# Patient Record
Sex: Female | Born: 1935 | Race: White | Hispanic: No | Smoking: Never smoker
Health system: Southern US, Community
[De-identification: ages and names within clinical notes are randomized; demographics above are authoritative.]

## PROBLEM LIST (undated history)

## (undated) DIAGNOSIS — T8859XA Other complications of anesthesia, initial encounter: Secondary | ICD-10-CM

## (undated) DIAGNOSIS — I2510 Atherosclerotic heart disease of native coronary artery without angina pectoris: Secondary | ICD-10-CM

## (undated) DIAGNOSIS — K219 Gastro-esophageal reflux disease without esophagitis: Secondary | ICD-10-CM

## (undated) DIAGNOSIS — T4145XA Adverse effect of unspecified anesthetic, initial encounter: Secondary | ICD-10-CM

## (undated) DIAGNOSIS — D472 Monoclonal gammopathy: Secondary | ICD-10-CM

## (undated) DIAGNOSIS — R29898 Other symptoms and signs involving the musculoskeletal system: Secondary | ICD-10-CM

## (undated) DIAGNOSIS — G8929 Other chronic pain: Secondary | ICD-10-CM

## (undated) DIAGNOSIS — K635 Polyp of colon: Secondary | ICD-10-CM

## (undated) DIAGNOSIS — F32A Depression, unspecified: Secondary | ICD-10-CM

## (undated) DIAGNOSIS — M1990 Unspecified osteoarthritis, unspecified site: Secondary | ICD-10-CM

## (undated) DIAGNOSIS — E039 Hypothyroidism, unspecified: Secondary | ICD-10-CM

## (undated) DIAGNOSIS — G2581 Restless legs syndrome: Secondary | ICD-10-CM

## (undated) DIAGNOSIS — J189 Pneumonia, unspecified organism: Secondary | ICD-10-CM

## (undated) DIAGNOSIS — N951 Menopausal and female climacteric states: Secondary | ICD-10-CM

## (undated) DIAGNOSIS — IMO0002 Reserved for concepts with insufficient information to code with codable children: Secondary | ICD-10-CM

## (undated) DIAGNOSIS — E785 Hyperlipidemia, unspecified: Secondary | ICD-10-CM

## (undated) DIAGNOSIS — F419 Anxiety disorder, unspecified: Secondary | ICD-10-CM

## (undated) DIAGNOSIS — I10 Essential (primary) hypertension: Secondary | ICD-10-CM

## (undated) DIAGNOSIS — N3281 Overactive bladder: Secondary | ICD-10-CM

## (undated) DIAGNOSIS — I8390 Asymptomatic varicose veins of unspecified lower extremity: Secondary | ICD-10-CM

## (undated) DIAGNOSIS — G629 Polyneuropathy, unspecified: Secondary | ICD-10-CM

## (undated) DIAGNOSIS — R519 Headache, unspecified: Secondary | ICD-10-CM

## (undated) DIAGNOSIS — Z8719 Personal history of other diseases of the digestive system: Secondary | ICD-10-CM

## (undated) HISTORY — DX: Reserved for concepts with insufficient information to code with codable children: IMO0002

## (undated) HISTORY — PX: REPLACEMENT TOTAL KNEE: SUR1224

## (undated) HISTORY — PX: OTHER SURGICAL HISTORY: SHX169

## (undated) HISTORY — DX: Monoclonal gammopathy: D47.2

## (undated) HISTORY — DX: Polyneuropathy, unspecified: G62.9

## (undated) HISTORY — DX: Asymptomatic varicose veins of unspecified lower extremity: I83.90

## (undated) HISTORY — PX: EYE SURGERY: SHX253

## (undated) HISTORY — DX: Headache, unspecified: R51.9

## (undated) HISTORY — DX: Polyp of colon: K63.5

## (undated) HISTORY — DX: Hyperlipidemia, unspecified: E78.5

## (undated) HISTORY — DX: Anxiety disorder, unspecified: F41.9

## (undated) HISTORY — PX: COLONOSCOPY: SHX174

## (undated) HISTORY — PX: KYPHOPLASTY: SHX5884

## (undated) HISTORY — PX: OOPHORECTOMY: SHX86

## (undated) HISTORY — DX: Overactive bladder: N32.81

## (undated) HISTORY — DX: Menopausal and female climacteric states: N95.1

## (undated) HISTORY — DX: Other chronic pain: G89.29

---

## 1980-05-30 HISTORY — PX: CARPAL TUNNEL RELEASE: SHX101

## 1990-05-30 HISTORY — PX: CHOLECYSTECTOMY: SHX55

## 1990-05-30 HISTORY — PX: ABDOMINAL HYSTERECTOMY: SHX81

## 1997-10-27 ENCOUNTER — Ambulatory Visit (HOSPITAL_COMMUNITY): Admission: RE | Admit: 1997-10-27 | Discharge: 1997-10-27 | Payer: Self-pay | Admitting: Neurological Surgery

## 1997-11-10 ENCOUNTER — Ambulatory Visit (HOSPITAL_COMMUNITY): Admission: RE | Admit: 1997-11-10 | Discharge: 1997-11-10 | Payer: Self-pay | Admitting: Neurological Surgery

## 1997-11-24 ENCOUNTER — Ambulatory Visit (HOSPITAL_COMMUNITY): Admission: RE | Admit: 1997-11-24 | Discharge: 1997-11-24 | Payer: Self-pay | Admitting: Neurological Surgery

## 1997-12-08 ENCOUNTER — Ambulatory Visit (HOSPITAL_COMMUNITY): Admission: RE | Admit: 1997-12-08 | Discharge: 1997-12-08 | Payer: Self-pay | Admitting: Cardiology

## 1997-12-22 ENCOUNTER — Ambulatory Visit (HOSPITAL_COMMUNITY): Admission: RE | Admit: 1997-12-22 | Discharge: 1997-12-22 | Payer: Self-pay | Admitting: Cardiology

## 1997-12-25 ENCOUNTER — Ambulatory Visit (HOSPITAL_COMMUNITY): Admission: RE | Admit: 1997-12-25 | Discharge: 1997-12-25 | Payer: Self-pay | Admitting: Internal Medicine

## 1997-12-30 ENCOUNTER — Ambulatory Visit (HOSPITAL_COMMUNITY): Admission: RE | Admit: 1997-12-30 | Discharge: 1997-12-30 | Payer: Self-pay | Admitting: Internal Medicine

## 1998-02-27 ENCOUNTER — Ambulatory Visit (HOSPITAL_COMMUNITY): Admission: RE | Admit: 1998-02-27 | Discharge: 1998-02-27 | Payer: Self-pay | Admitting: Neurological Surgery

## 1998-05-20 ENCOUNTER — Other Ambulatory Visit: Admission: RE | Admit: 1998-05-20 | Discharge: 1998-05-20 | Payer: Self-pay | Admitting: Obstetrics and Gynecology

## 1999-05-21 ENCOUNTER — Other Ambulatory Visit: Admission: RE | Admit: 1999-05-21 | Discharge: 1999-05-21 | Payer: Self-pay | Admitting: Obstetrics and Gynecology

## 1999-07-01 ENCOUNTER — Ambulatory Visit (HOSPITAL_COMMUNITY): Admission: RE | Admit: 1999-07-01 | Discharge: 1999-07-01 | Payer: Self-pay | Admitting: Obstetrics & Gynecology

## 1999-11-25 ENCOUNTER — Ambulatory Visit (HOSPITAL_BASED_OUTPATIENT_CLINIC_OR_DEPARTMENT_OTHER): Admission: RE | Admit: 1999-11-25 | Discharge: 1999-11-25 | Payer: Self-pay | Admitting: Orthopedic Surgery

## 2000-05-25 ENCOUNTER — Other Ambulatory Visit: Admission: RE | Admit: 2000-05-25 | Discharge: 2000-05-25 | Payer: Self-pay | Admitting: Obstetrics and Gynecology

## 2000-06-06 ENCOUNTER — Ambulatory Visit (HOSPITAL_BASED_OUTPATIENT_CLINIC_OR_DEPARTMENT_OTHER): Admission: RE | Admit: 2000-06-06 | Discharge: 2000-06-06 | Payer: Self-pay | Admitting: Orthopedic Surgery

## 2000-09-03 ENCOUNTER — Ambulatory Visit (HOSPITAL_COMMUNITY): Admission: RE | Admit: 2000-09-03 | Discharge: 2000-09-03 | Payer: Self-pay | Admitting: Orthopedic Surgery

## 2000-09-03 ENCOUNTER — Encounter: Payer: Self-pay | Admitting: Orthopedic Surgery

## 2001-09-07 ENCOUNTER — Ambulatory Visit (HOSPITAL_COMMUNITY): Admission: RE | Admit: 2001-09-07 | Discharge: 2001-09-07 | Payer: Self-pay | Admitting: Oral & Maxillofacial Surgery

## 2001-09-07 ENCOUNTER — Encounter: Payer: Self-pay | Admitting: Oral & Maxillofacial Surgery

## 2002-04-16 ENCOUNTER — Ambulatory Visit (HOSPITAL_COMMUNITY): Admission: RE | Admit: 2002-04-16 | Discharge: 2002-04-16 | Payer: Self-pay | Admitting: Cardiology

## 2002-04-16 ENCOUNTER — Encounter: Payer: Self-pay | Admitting: Cardiology

## 2002-06-06 ENCOUNTER — Other Ambulatory Visit: Admission: RE | Admit: 2002-06-06 | Discharge: 2002-06-06 | Payer: Self-pay | Admitting: Obstetrics and Gynecology

## 2002-11-16 ENCOUNTER — Encounter: Admission: RE | Admit: 2002-11-16 | Discharge: 2002-11-16 | Payer: Self-pay | Admitting: Orthopedic Surgery

## 2002-11-16 ENCOUNTER — Encounter: Payer: Self-pay | Admitting: Orthopedic Surgery

## 2002-12-04 ENCOUNTER — Encounter: Admission: RE | Admit: 2002-12-04 | Discharge: 2002-12-04 | Payer: Self-pay | Admitting: Neurological Surgery

## 2002-12-04 ENCOUNTER — Encounter: Payer: Self-pay | Admitting: Neurological Surgery

## 2002-12-04 ENCOUNTER — Encounter: Payer: Self-pay | Admitting: Diagnostic Radiology

## 2003-01-02 ENCOUNTER — Encounter: Payer: Self-pay | Admitting: Orthopedic Surgery

## 2003-01-02 ENCOUNTER — Encounter: Admission: RE | Admit: 2003-01-02 | Discharge: 2003-01-02 | Payer: Self-pay | Admitting: Orthopedic Surgery

## 2003-02-28 ENCOUNTER — Ambulatory Visit (HOSPITAL_COMMUNITY): Admission: RE | Admit: 2003-02-28 | Discharge: 2003-02-28 | Payer: Self-pay | Admitting: Orthopedic Surgery

## 2003-02-28 ENCOUNTER — Encounter: Payer: Self-pay | Admitting: Orthopedic Surgery

## 2003-03-28 ENCOUNTER — Encounter: Admission: RE | Admit: 2003-03-28 | Discharge: 2003-03-28 | Payer: Self-pay | Admitting: Neurological Surgery

## 2003-05-20 ENCOUNTER — Encounter: Admission: RE | Admit: 2003-05-20 | Discharge: 2003-05-20 | Payer: Self-pay | Admitting: Neurological Surgery

## 2004-06-10 ENCOUNTER — Other Ambulatory Visit: Admission: RE | Admit: 2004-06-10 | Discharge: 2004-06-10 | Payer: Self-pay | Admitting: Obstetrics and Gynecology

## 2005-04-28 ENCOUNTER — Encounter: Admission: RE | Admit: 2005-04-28 | Discharge: 2005-04-28 | Payer: Self-pay | Admitting: Orthopedic Surgery

## 2005-05-26 ENCOUNTER — Ambulatory Visit (HOSPITAL_BASED_OUTPATIENT_CLINIC_OR_DEPARTMENT_OTHER): Admission: RE | Admit: 2005-05-26 | Discharge: 2005-05-26 | Payer: Self-pay | Admitting: Orthopedic Surgery

## 2005-05-26 ENCOUNTER — Ambulatory Visit (HOSPITAL_COMMUNITY): Admission: RE | Admit: 2005-05-26 | Discharge: 2005-05-26 | Payer: Self-pay | Admitting: Orthopedic Surgery

## 2005-09-28 ENCOUNTER — Encounter: Payer: Self-pay | Admitting: Neurological Surgery

## 2005-11-09 ENCOUNTER — Ambulatory Visit (HOSPITAL_COMMUNITY): Admission: RE | Admit: 2005-11-09 | Discharge: 2005-11-09 | Payer: Self-pay | Admitting: Interventional Cardiology

## 2005-11-09 ENCOUNTER — Encounter: Payer: Self-pay | Admitting: Vascular Surgery

## 2005-12-06 ENCOUNTER — Encounter: Admission: RE | Admit: 2005-12-06 | Discharge: 2005-12-06 | Payer: Self-pay | Admitting: Orthopedic Surgery

## 2006-03-13 ENCOUNTER — Encounter: Admission: RE | Admit: 2006-03-13 | Discharge: 2006-03-13 | Payer: Self-pay | Admitting: Orthopedic Surgery

## 2006-07-03 ENCOUNTER — Other Ambulatory Visit: Admission: RE | Admit: 2006-07-03 | Discharge: 2006-07-03 | Payer: Self-pay | Admitting: Obstetrics and Gynecology

## 2006-07-27 ENCOUNTER — Emergency Department (HOSPITAL_COMMUNITY): Admission: EM | Admit: 2006-07-27 | Discharge: 2006-07-27 | Payer: Self-pay | Admitting: Emergency Medicine

## 2006-09-15 ENCOUNTER — Encounter: Admission: RE | Admit: 2006-09-15 | Discharge: 2006-09-15 | Payer: Self-pay | Admitting: Internal Medicine

## 2006-09-21 ENCOUNTER — Ambulatory Visit (HOSPITAL_COMMUNITY): Admission: RE | Admit: 2006-09-21 | Discharge: 2006-09-22 | Payer: Self-pay | Admitting: Neurological Surgery

## 2006-09-21 ENCOUNTER — Encounter (INDEPENDENT_AMBULATORY_CARE_PROVIDER_SITE_OTHER): Payer: Self-pay | Admitting: *Deleted

## 2006-10-02 ENCOUNTER — Inpatient Hospital Stay (HOSPITAL_COMMUNITY): Admission: AD | Admit: 2006-10-02 | Discharge: 2006-10-04 | Payer: Self-pay | Admitting: Internal Medicine

## 2006-11-19 ENCOUNTER — Encounter: Admission: RE | Admit: 2006-11-19 | Discharge: 2006-11-19 | Payer: Self-pay | Admitting: Neurological Surgery

## 2007-02-12 ENCOUNTER — Inpatient Hospital Stay (HOSPITAL_COMMUNITY): Admission: RE | Admit: 2007-02-12 | Discharge: 2007-02-15 | Payer: Self-pay | Admitting: Orthopedic Surgery

## 2007-04-06 ENCOUNTER — Inpatient Hospital Stay (HOSPITAL_COMMUNITY): Admission: RE | Admit: 2007-04-06 | Discharge: 2007-04-06 | Payer: Self-pay | Admitting: Neurological Surgery

## 2007-04-06 ENCOUNTER — Encounter (INDEPENDENT_AMBULATORY_CARE_PROVIDER_SITE_OTHER): Payer: Self-pay | Admitting: Neurological Surgery

## 2007-07-10 ENCOUNTER — Observation Stay (HOSPITAL_COMMUNITY): Admission: RE | Admit: 2007-07-10 | Discharge: 2007-07-10 | Payer: Self-pay | Admitting: Neurological Surgery

## 2007-07-10 ENCOUNTER — Encounter (INDEPENDENT_AMBULATORY_CARE_PROVIDER_SITE_OTHER): Payer: Self-pay | Admitting: Neurological Surgery

## 2008-02-02 ENCOUNTER — Encounter: Admission: RE | Admit: 2008-02-02 | Discharge: 2008-02-02 | Payer: Self-pay | Admitting: Neurological Surgery

## 2008-02-22 ENCOUNTER — Encounter: Admission: RE | Admit: 2008-02-22 | Discharge: 2008-02-22 | Payer: Self-pay | Admitting: Neurological Surgery

## 2008-03-04 ENCOUNTER — Encounter: Admission: RE | Admit: 2008-03-04 | Discharge: 2008-03-04 | Payer: Self-pay | Admitting: Gastroenterology

## 2008-07-10 ENCOUNTER — Ambulatory Visit: Payer: Self-pay | Admitting: Obstetrics and Gynecology

## 2008-07-10 ENCOUNTER — Other Ambulatory Visit: Admission: RE | Admit: 2008-07-10 | Discharge: 2008-07-10 | Payer: Self-pay | Admitting: Obstetrics and Gynecology

## 2008-07-10 ENCOUNTER — Encounter: Payer: Self-pay | Admitting: Obstetrics and Gynecology

## 2008-09-23 ENCOUNTER — Encounter: Admission: RE | Admit: 2008-09-23 | Discharge: 2008-09-23 | Payer: Self-pay | Admitting: Neurology

## 2009-05-08 ENCOUNTER — Ambulatory Visit: Payer: Self-pay | Admitting: Obstetrics and Gynecology

## 2009-05-15 ENCOUNTER — Ambulatory Visit (HOSPITAL_COMMUNITY): Admission: RE | Admit: 2009-05-15 | Discharge: 2009-05-15 | Payer: Self-pay | Admitting: Gastroenterology

## 2009-05-26 ENCOUNTER — Encounter: Admission: RE | Admit: 2009-05-26 | Discharge: 2009-05-26 | Payer: Self-pay | Admitting: Gastroenterology

## 2009-05-28 ENCOUNTER — Encounter: Admission: RE | Admit: 2009-05-28 | Discharge: 2009-05-28 | Payer: Self-pay | Admitting: Urology

## 2009-05-30 DIAGNOSIS — Z9289 Personal history of other medical treatment: Secondary | ICD-10-CM

## 2009-05-30 HISTORY — PX: SPINAL FUSION: SHX223

## 2009-05-30 HISTORY — PX: BLADDER SUSPENSION: SHX72

## 2009-05-30 HISTORY — DX: Personal history of other medical treatment: Z92.89

## 2009-06-04 ENCOUNTER — Ambulatory Visit (HOSPITAL_BASED_OUTPATIENT_CLINIC_OR_DEPARTMENT_OTHER): Admission: RE | Admit: 2009-06-04 | Discharge: 2009-06-04 | Payer: Self-pay | Admitting: Urology

## 2009-07-03 DIAGNOSIS — F419 Anxiety disorder, unspecified: Secondary | ICD-10-CM | POA: Diagnosis present

## 2009-07-30 ENCOUNTER — Ambulatory Visit: Payer: Self-pay | Admitting: Obstetrics and Gynecology

## 2009-08-15 ENCOUNTER — Emergency Department (HOSPITAL_BASED_OUTPATIENT_CLINIC_OR_DEPARTMENT_OTHER): Admission: EM | Admit: 2009-08-15 | Discharge: 2009-08-15 | Payer: Self-pay | Admitting: Emergency Medicine

## 2009-08-15 ENCOUNTER — Ambulatory Visit: Payer: Self-pay | Admitting: Diagnostic Radiology

## 2009-12-28 ENCOUNTER — Inpatient Hospital Stay (HOSPITAL_COMMUNITY): Admission: RE | Admit: 2009-12-28 | Discharge: 2009-12-31 | Payer: Self-pay | Admitting: Orthopedic Surgery

## 2010-02-28 ENCOUNTER — Encounter: Admission: RE | Admit: 2010-02-28 | Discharge: 2010-02-28 | Payer: Self-pay | Admitting: Neurological Surgery

## 2010-03-25 ENCOUNTER — Inpatient Hospital Stay (HOSPITAL_COMMUNITY): Admission: RE | Admit: 2010-03-25 | Discharge: 2010-03-29 | Payer: Self-pay | Admitting: Neurological Surgery

## 2010-08-02 ENCOUNTER — Other Ambulatory Visit: Payer: Self-pay | Admitting: Obstetrics and Gynecology

## 2010-08-02 ENCOUNTER — Other Ambulatory Visit (HOSPITAL_COMMUNITY)
Admission: RE | Admit: 2010-08-02 | Payer: Medicare Other | Source: Ambulatory Visit | Admitting: Obstetrics and Gynecology

## 2010-08-02 ENCOUNTER — Encounter (INDEPENDENT_AMBULATORY_CARE_PROVIDER_SITE_OTHER): Payer: Medicare Other | Admitting: Obstetrics and Gynecology

## 2010-08-02 ENCOUNTER — Encounter: Payer: Self-pay | Admitting: Obstetrics and Gynecology

## 2010-08-02 DIAGNOSIS — B373 Candidiasis of vulva and vagina: Secondary | ICD-10-CM

## 2010-08-02 DIAGNOSIS — Z124 Encounter for screening for malignant neoplasm of cervix: Secondary | ICD-10-CM

## 2010-08-02 DIAGNOSIS — B3731 Acute candidiasis of vulva and vagina: Secondary | ICD-10-CM

## 2010-08-02 DIAGNOSIS — R82998 Other abnormal findings in urine: Secondary | ICD-10-CM

## 2010-08-02 DIAGNOSIS — N898 Other specified noninflammatory disorders of vagina: Secondary | ICD-10-CM

## 2010-08-02 DIAGNOSIS — N952 Postmenopausal atrophic vaginitis: Secondary | ICD-10-CM

## 2010-08-11 LAB — BASIC METABOLIC PANEL
BUN: 20 mg/dL (ref 6–23)
CO2: 31 mEq/L (ref 19–32)
Calcium: 10.3 mg/dL (ref 8.4–10.5)
Chloride: 101 mEq/L (ref 96–112)
Creatinine, Ser: 0.97 mg/dL (ref 0.4–1.2)
GFR calc non Af Amer: 56 mL/min — ABNORMAL LOW (ref >60)
Glucose, Bld: 87 mg/dL (ref 70–99)
Potassium: 3.8 mEq/L (ref 3.5–5.1)
Sodium: 138 mEq/L (ref 135–145)

## 2010-08-11 LAB — ABO/RH: ABO/RH(D): O NEG

## 2010-08-11 LAB — TYPE AND SCREEN
ABO/RH(D): O NEG
Antibody Screen: NEGATIVE

## 2010-08-11 LAB — CBC
HCT: 41.4 % (ref 36.0–46.0)
Hemoglobin: 14.3 g/dL (ref 12.0–15.0)
MCH: 32.6 pg (ref 26.0–34.0)
MCHC: 34.5 g/dL (ref 30.0–36.0)
MCV: 94.3 fL (ref 78.0–100.0)
Platelets: 288 10*3/uL (ref 150–400)
RBC: 4.39 MIL/uL (ref 3.87–5.11)
RDW: 13.7 % (ref 11.5–15.5)
WBC: 5.3 10*3/uL (ref 4.0–10.5)

## 2010-08-11 LAB — SURGICAL PCR SCREEN: MRSA, PCR: NEGATIVE

## 2010-08-13 LAB — CBC
HCT: 23.9 % — ABNORMAL LOW (ref 36.0–46.0)
HCT: 26.9 % — ABNORMAL LOW (ref 36.0–46.0)
HCT: 32.8 % — ABNORMAL LOW (ref 36.0–46.0)
Hemoglobin: 11.4 g/dL — ABNORMAL LOW (ref 12.0–15.0)
Hemoglobin: 8.1 g/dL — ABNORMAL LOW (ref 12.0–15.0)
Hemoglobin: 9.2 g/dL — ABNORMAL LOW (ref 12.0–15.0)
MCH: 32.6 pg (ref 26.0–34.0)
MCH: 32.8 pg (ref 26.0–34.0)
MCH: 32.9 pg (ref 26.0–34.0)
MCHC: 34.1 g/dL (ref 30.0–36.0)
MCHC: 34.3 g/dL (ref 30.0–36.0)
MCHC: 34.8 g/dL (ref 30.0–36.0)
MCV: 93.5 fL (ref 78.0–100.0)
MCV: 95.6 fL (ref 78.0–100.0)
MCV: 96.6 fL (ref 78.0–100.0)
Platelets: 200 10*3/uL (ref 150–400)
Platelets: 202 10*3/uL (ref 150–400)
Platelets: 236 10*3/uL (ref 150–400)
RBC: 2.48 MIL/uL — ABNORMAL LOW (ref 3.87–5.11)
RBC: 2.81 MIL/uL — ABNORMAL LOW (ref 3.87–5.11)
RBC: 3.51 MIL/uL — ABNORMAL LOW (ref 3.87–5.11)
RDW: 14.5 % (ref 11.5–15.5)
RDW: 14.5 % (ref 11.5–15.5)
RDW: 15.9 % — ABNORMAL HIGH (ref 11.5–15.5)
WBC: 6 10*3/uL (ref 4.0–10.5)
WBC: 6 10*3/uL (ref 4.0–10.5)
WBC: 6.2 10*3/uL (ref 4.0–10.5)

## 2010-08-13 LAB — BASIC METABOLIC PANEL
BUN: 14 mg/dL (ref 6–23)
BUN: 9 mg/dL (ref 6–23)
BUN: 9 mg/dL (ref 6–23)
CO2: 27 mEq/L (ref 19–32)
CO2: 30 mEq/L (ref 19–32)
CO2: 31 mEq/L (ref 19–32)
Calcium: 7.9 mg/dL — ABNORMAL LOW (ref 8.4–10.5)
Calcium: 8.1 mg/dL — ABNORMAL LOW (ref 8.4–10.5)
Calcium: 8.5 mg/dL (ref 8.4–10.5)
Chloride: 101 mEq/L (ref 96–112)
Chloride: 102 mEq/L (ref 96–112)
Chloride: 103 mEq/L (ref 96–112)
Creatinine, Ser: 0.8 mg/dL (ref 0.4–1.2)
Creatinine, Ser: 0.87 mg/dL (ref 0.4–1.2)
Creatinine, Ser: 0.93 mg/dL (ref 0.4–1.2)
GFR calc Af Amer: >60 mL/min (ref >60)
GFR calc Af Amer: >60 mL/min (ref >60)
GFR calc Af Amer: >60 mL/min (ref >60)
GFR calc non Af Amer: 59 mL/min — ABNORMAL LOW (ref >60)
GFR calc non Af Amer: >60 mL/min (ref >60)
GFR calc non Af Amer: >60 mL/min (ref >60)
Glucose, Bld: 112 mg/dL — ABNORMAL HIGH (ref 70–99)
Glucose, Bld: 156 mg/dL — ABNORMAL HIGH (ref 70–99)
Glucose, Bld: 206 mg/dL — ABNORMAL HIGH (ref 70–99)
Potassium: 3.3 mEq/L — ABNORMAL LOW (ref 3.5–5.1)
Potassium: 3.6 mEq/L (ref 3.5–5.1)
Potassium: 3.8 mEq/L (ref 3.5–5.1)
Sodium: 134 mEq/L — ABNORMAL LOW (ref 135–145)
Sodium: 138 mEq/L (ref 135–145)
Sodium: 139 mEq/L (ref 135–145)

## 2010-08-13 LAB — TYPE AND SCREEN
ABO/RH(D): O NEG
Antibody Screen: NEGATIVE

## 2010-08-13 LAB — PROTIME-INR
INR: 1.13 (ref 0.00–1.49)
INR: 1.91 — ABNORMAL HIGH (ref 0.00–1.49)
INR: 2.9 — ABNORMAL HIGH (ref 0.00–1.49)
Prothrombin Time: 14.4 seconds (ref 11.6–15.2)
Prothrombin Time: 22 seconds — ABNORMAL HIGH (ref 11.6–15.2)
Prothrombin Time: 30.4 seconds — ABNORMAL HIGH (ref 11.6–15.2)

## 2010-08-14 LAB — CBC
HCT: 37.4 % (ref 36.0–46.0)
Hemoglobin: 12.9 g/dL (ref 12.0–15.0)
MCH: 32.5 pg (ref 26.0–34.0)
MCHC: 34.5 g/dL (ref 30.0–36.0)
MCV: 94.3 fL (ref 78.0–100.0)
Platelets: 264 10*3/uL (ref 150–400)
RBC: 3.97 MIL/uL (ref 3.87–5.11)
RDW: 14.6 % (ref 11.5–15.5)
WBC: 5.5 10*3/uL (ref 4.0–10.5)

## 2010-08-14 LAB — COMPREHENSIVE METABOLIC PANEL
ALT: 36 U/L — ABNORMAL HIGH (ref 0–35)
AST: 32 U/L (ref 0–37)
Albumin: 4 g/dL (ref 3.5–5.2)
Alkaline Phosphatase: 71 U/L (ref 39–117)
BUN: 24 mg/dL — ABNORMAL HIGH (ref 6–23)
CO2: 26 mEq/L (ref 19–32)
Calcium: 9.7 mg/dL (ref 8.4–10.5)
Chloride: 101 mEq/L (ref 96–112)
Creatinine, Ser: 1.12 mg/dL (ref 0.4–1.2)
GFR calc Af Amer: 58 mL/min — ABNORMAL LOW (ref >60)
GFR calc non Af Amer: 48 mL/min — ABNORMAL LOW (ref >60)
Glucose, Bld: 80 mg/dL (ref 70–99)
Potassium: 4 mEq/L (ref 3.5–5.1)
Sodium: 137 mEq/L (ref 135–145)
Total Bilirubin: 0.9 mg/dL (ref 0.3–1.2)
Total Protein: 6.8 g/dL (ref 6.0–8.3)

## 2010-08-14 LAB — URINALYSIS, ROUTINE W REFLEX MICROSCOPIC
Bilirubin Urine: NEGATIVE
Glucose, UA: NEGATIVE mg/dL
Hgb urine dipstick: NEGATIVE
Ketones, ur: NEGATIVE mg/dL
Nitrite: NEGATIVE
Protein, ur: NEGATIVE mg/dL
Specific Gravity, Urine: 1.017 (ref 1.005–1.030)
Urobilinogen, UA: 0.2 mg/dL (ref 0.0–1.0)
pH: 5 (ref 5.0–8.0)

## 2010-08-14 LAB — PROTIME-INR
INR: 0.94 (ref 0.00–1.49)
Prothrombin Time: 12.5 seconds (ref 11.6–15.2)

## 2010-08-14 LAB — APTT: aPTT: 32 seconds (ref 24–37)

## 2010-08-14 LAB — SURGICAL PCR SCREEN
MRSA, PCR: NEGATIVE
Staphylococcus aureus: NEGATIVE

## 2010-08-15 LAB — TYPE AND SCREEN
ABO/RH(D): O NEG
Antibody Screen: NEGATIVE

## 2010-08-19 ENCOUNTER — Other Ambulatory Visit: Payer: Self-pay | Admitting: Dermatology

## 2010-08-30 LAB — CBC
HCT: 39.2 % (ref 36.0–46.0)
Hemoglobin: 13.3 g/dL (ref 12.0–15.0)
MCHC: 33.8 g/dL (ref 30.0–36.0)
MCV: 96.4 fL (ref 78.0–100.0)
Platelets: 329 10*3/uL (ref 150–400)
RBC: 4.07 MIL/uL (ref 3.87–5.11)
RDW: 14 % (ref 11.5–15.5)
WBC: 4.9 10*3/uL (ref 4.0–10.5)

## 2010-08-30 LAB — APTT: aPTT: 33 seconds (ref 24–37)

## 2010-08-30 LAB — PROTIME-INR
INR: 0.87 (ref 0.00–1.49)
Prothrombin Time: 11.8 seconds (ref 11.6–15.2)

## 2010-10-12 NOTE — Op Note (Signed)
[name], [name]                ACCOUNT NO.:  [id number]   MEDICAL RECORD NO.:  [id number]          PATIENT TYPE:  INP   LOCATION:  0006                         FACILITY:  [facility]   PHYSICIAN:  Ollen Gross, M.D.    DATE OF BIRTH:  07/22/1933   DATE OF PROCEDURE:  02/12/2007  DATE OF DISCHARGE:                               OPERATIVE REPORT   PREOPERATIVE DIAGNOSIS:  Osteoarthritis right knee.   POSTOPERATIVE DIAGNOSIS:  Osteoarthritis right knee.   PROCEDURE:  Right total knee arthroplasty.   SURGEON:  Ollen Gross, M.D.   ASSISTANT:  Alexzandrew L. Perkins, P.A.C.   ANESTHESIA:  Spinal.   ESTIMATED BLOOD LOSS:  Minimal.   DRAINS:  None.   TOURNIQUET TIME:  34 minutes at 3 mmHg.   COMPLICATIONS:  None.   CONDITION:  Stable in recovery.   CLINICAL NOTE:  [name] is a 77 year old female with end-stage  arthritis of the right knee with progressively worsening pain and  dysfunction.  She has failed extensive nonoperative management and  presents now for total knee arthroplasty.   PROCEDURE IN DETAIL:  After the successful administration of spinal  anesthetic, a tourniquet is placed high on the right thigh and right  lower extremity prepped and draped in the usual sterile fashion.  Extremities wrapped in Esmarch, knee flexed and tourniquet inflated 300  mmHg.  Midline incision was made with 10 blade through subcutaneous  tissue to the level of the extensor mechanism.  A fresh blade is used to  make a medial parapatellar arthrotomy.  Soft tissue over the proximal  medial tibia is subperiosteally elevated to the joint line with the  knife and into the semimembranosus bursa with a Cobb elevator.  Soft  tissue laterally is elevated with attention being paid to avoid the  patellar tendon on tibial tubercle.  Patella subluxed laterally, knee  flexed 90 degrees, ACL and PCL removed.  Drill was used to create a  starting hole in the distal femur.  Canal was thoroughly  irrigated.  A 5-  degree right valgus alignment guide is placed.  Referencing off the  posterior condyles, rotations marked and the block pinned to remove 10  mm of the distal femur.  Distal femoral resection is made with an  oscillating saw.  Sizing blocks placed, size 3 is most appropriate.  Rotations marked off the epicondylar axis, size 3 cutting block placed  and the anterior-posterior and chamfer cuts made.   Tibia subluxed forward and the menisci removed.  The extramedullary  tibial alignment guides placed, referencing proximally at the medial  aspect of the tibial tubercle and distally along the second metatarsal  axis and tibial crest.  The block is pinned to remove 10 mm off the  nondeficient lateral side.  Tibial resection is made with an oscillating  saw.  Size 2.5 is the most appropriate tibial component and the proximal  tibia prepares the modular drill and keel punch for a size 2.5.  Femoral  preparation is completed with the intercondylar cut for size 3.   Size 2.5 mobile bearing tibial trial size 3  posterior stabilized femoral  trial and a 10 mm posterior stabilized rotating platform insert trial  are placed.  With a 10, full extension is achieved with excellent varus  and valgus balance throughout full range of motion.  The patella was  everted, thickness measured to be 18 mm.  Freehand resection taken to 12  mm, 35 template is placed, lug holes were drilled, trial patella is  placed and it tracks normally.  Osteophytes removed off the posterior  femur with the trial in place.  All trials are removed and the cut bone  surfaces are prepared with pulsatile lavage.  Cement was mixed and once  ready for implantation, the size 2.5 mobile bearing tibial tray, size 3  posterior stabilized femur and 35 patella are cemented into place.  The  patella was held with a clamp.  Trial 10-mm inserts placed, knee held in  full extension and all extruded cement removed.  Once the cement  fully  hardened, then the permanent 10 mm posterior stabilized rotating  platform insert is placed into the tibial tray.  The wound is copiously  irrigated with saline solution and the FloSeal injected on the posterior  capsule, and then into medial lateral gutters and suprapatellar area.  Moist sponge is placed and tourniquet released for total time of 34  minutes.  The sponge is held for 2 minutes and removed and then minimal  bleeding is encountered and it is stopped with cautery.  The wounds  again irrigated and extensor mechanism closed with interrupted #1 PDS.  Flexion against gravity to 140 degrees.  Subcu was closed with  interrupted 2-0 Vicryl and subcuticular running 4-0 Monocryl.  Incisions  cleaned and dried and Steri-Strips and bulky sterile dressing applied.  She is placed into a knee immobilizer, awakened and transferred to  recovery in stable condition.      Ollen Gross, M.D.  Electronically Signed     FA/MEDQ  D:  02/12/2007  T:  02/12/2007  Job:  [id number]

## 2010-10-12 NOTE — Op Note (Signed)
NAMESHEVONNE, WOLF                ACCOUNT NO.:  [id number]   MEDICAL RECORD NO.:  [id number]          PATIENT TYPE:  INP   LOCATION:  2899                         FACILITY:  [facility]   PHYSICIAN:  Stefani Dama, M.D.  DATE OF BIRTH:  07-14-1933   DATE OF PROCEDURE:  04/06/2007  DATE OF DISCHARGE:  04/06/2007                               OPERATIVE REPORT   PREOPERATIVE DIAGNOSIS:  T11 vertebral compression fracture secondary to  osteoporosis.   POSTOPERATIVE DIAGNOSIS:  T11 vertebral compression fracture secondary  to osteoporosis.   PROCEDURE:  T11 acrylic balloon kyphoplasty.   SURGEON:  Stefani Dama, M.D.   ANESTHESIA:  General endotracheal.   INDICATIONS:  Tye Juarez is a 77 year old individual who has had a  previous T8 vertebral compression fracture secondary to osteoporosis.  It was also noted that she was severely vitamin D deficient. She was  advised regarding vertebral balloon kyphoplasty which worked well for  her T8 fracture and desires to have this done now.   DESCRIPTION OF PROCEDURE:  The patient was brought to the operating room  supine on the stretcher. After the smooth induction of general  endotracheal anesthesia, she was turned prone.  Her back was prepped  with alcohol and DuraPrep and draped in a sterile fashion.  Biplanar  fluoroscopy was used to localize the T11 vertebra in the AP and lateral  projection. Skin entry sites were chosen at the 10 o'clock and 2 o'clock  position relative to the pedicles at T11 and a stab incision was made  after infiltrating the skin with 1% lidocaine without epinephrine.  A  Jamshidi needle was then placed to the upper outer quadrant of the T11  pedicle and it was placed into the vertebral body with progressive  fluoroscopic guidance to localize the posterior vertebral body wall at  the mid portion of the pedicle. Once a cannula was inserted into this  area, the single portion was withdrawn and then a bone biopsy  needle was  used to obtain vertebral body biopsy of T11 vertebra. Core sample was  sent for pathologic examination and then the balloon was placed into the  vertebral body and inflated to 200 mmHg. The balloon seemed to maintain  itself on the left side of the vertebral body.  A right sided bone probe  was then placed.  This was infiltrated to 200 mmHg and very little  filling of the balloon occurred, some under the superior endplate did  occur on this side. Glue was mixed and allowed to set to the appropriate  consistency, and then injected into the vertebral body.  Once the  balloons were withdrawn, a total of 3 mL was inserted on the left side  and a total of 2 mL was inserted on the right side. There was good  filling of the superior endplate from the right side.  Once this was  completed, the Jamshidi cannulae were removed.  No tails were noted and  the incisions were closed with 3-0 Vicryl in an inverted interrupted  fashion.  Final AP and lateral fluoroscopic images were obtained.  The  patient tolerated the procedure well and was returned to the recovery  room in stable condition.     Stefani Dama, M.D.  Electronically Signed    HJE/MEDQ  D:  04/06/2007  T:  04/07/2007  Job:  [id number]

## 2010-10-12 NOTE — H&P (Signed)
NAMERENU, ASBY                ACCOUNT NO.:  [id number]   MEDICAL RECORD NO.:  [id number]          PATIENT TYPE:  INP   LOCATION:  2899                         FACILITY:  [facility]   PHYSICIAN:  Stefani Dama, M.D.  DATE OF BIRTH:  27-Jul-1933   DATE OF ADMISSION:  04/06/2007  DATE OF DISCHARGE:  04/06/2007                              HISTORY & PHYSICAL   ADMISSION DIAGNOSES:  1. T11 osteoporotic can of vertebral compression fracture.  2. Status post knee arthroplasty.  3. Osteoporosis.  4. Vitamin D deficiency.   HISTORY OF PRESENT ILLNESS:  The Lineback is a 77 year old individual was  has had a previous vertebral compression fracture C8.  This was treated  with acrylic balloon kyphoplasty and she did well.  In September of this  year, she was admitted to the hospital where she underwent total knee  arthroplasty on the right side.  She tolerated procedure well.  However,  she was having back pain and x-ray demonstrated presence of T11  vertebral compression fracture.  She was advised regarding need for  acrylic balloon kyphoplasty and is now admitted for this procedure.   PAST MEDICAL HISTORY:  1. Hypertension.  2. Vitamin D deficiency.  3. Osteoporosis.  4. Osteoarthritis.   PAST SURGICAL HISTORY:  As noted.  She had a recent knee arthroplasty.   SOCIAL HISTORY:  She is married, lives with her husband.  Functions  independently.   SYSTEMS REVIEW:  Notable only for the items in history of present  illness.   PHYSICAL EXAMINATION:  GENERAL:  Reveals that she is alert, oriented,  cooperative individual.  No overt distress.  She stands and walks  without difficulty.  She does use a cane for recent right knee  arthroplasty.  Her motor strength is good in the upper and lower  extremities.  Cranial nerve examination reveals pupils are 3 mm briskly  reactive light accommodation.  Extraocular movements are full.  Face  symmetric to grimace.  Tongue and uvula midline.  Sclerae  and  conjunctivae are clear.  NECK:  Reveals no masses, no bruits are heard.  LUNGS:  Clear to auscultation.  HEART: Regular rate and rhythm.  The abdomen is soft.  Bowel sounds  positive.  No masses are palpable.  EXTREMITIES:  Reveal no cyanosis, clubbing or edema.   IMPRESSION:  The patient has vertebral compression fracture at T11.  He  is now being admitted to undergo acrylic balloon kyphoplasty.      Stefani Dama, M.D.  Electronically Signed     HJE/MEDQ  D:  04/06/2007  T:  04/08/2007  Job:  [id number]

## 2010-10-12 NOTE — Op Note (Signed)
NAMEAVERY, [name]                ACCOUNT NO.:  [id number]   MEDICAL RECORD NO.:  [id number]          PATIENT TYPE:  INP   LOCATION:  2899                         FACILITY:  [facility]   PHYSICIAN:  Stefani Dama, M.D.  DATE OF BIRTH:  1934/04/29   DATE OF PROCEDURE:  07/10/2007  DATE OF DISCHARGE:  07/10/2007                               OPERATIVE REPORT   PREOPERATIVE DIAGNOSIS:  T7 pathologic compression fracture.   POSTOPERATIVE DIAGNOSIS:  T7 pathologic compression fracture.   PROCEDURE:  T7 acrylic balloon kyphoplasty.   SURGEON:  Dr. Danielle Dess.   ANESTHESIA:  General endotracheal.   INDICATIONS:  [name] is a 77 year old individual who has had significant  onset of back pain rather acutely ill about a week ago.  She contacted  our office, and I advised that she come in for a plain x-ray.  Radiograph demonstrated the presence of a new inferior endplate fracture  of the vertebral body at T7 immediately above her T8 kyphoplasty.  I  contacted the patient regarding the fracture, and she indicated that she  was ready to undergo kyphoplasty.  Preparations were made, and the  patient is now being taken to the operating room to undergo acrylic  balloon kyphoplasty.  It was noted on the order sheet on the patient's  operative consent that she was consented for T10 acrylic balloon  kyphoplasty.  It is evident on the patient's fluoroscopic images that it  is the T7 vertebrae that is involved, and this is what I had discussed  in an office note that I dictated in my office with the patient.   PROCEDURE:  The patient was brought to the operating room supine on the  stretcher.  After smooth induction of general endotracheal anesthesia,  she was turned prone.  The back was prepped with alcohol and DuraPrep  and draped in sterile fashion.  Then, fluoroscopy was brought in AP and  lateral projections.  This identified the inferior endplate fracture of  the T7 vertebra immediately above her T8  kyphoplasty.  The 10 o'clock  position was identified for the left pedicle of the T7 vertebra, and the  skin above this was infiltrated with lidocaine with epinephrine.  A  small stab incision was created and then a Jamshidi needle was placed  through the outer upper aspect of the T7 pedicle on the pedicle entry  site.  The pedicle was then entered, and a trajectory from lateral to  medial across the pedicle into the vertebral body was obtained.  Bone  biopsy was then obtained of the T7 vertebral body.  This was sent for  specimen.  Then, a balloon was entered and inflated to a pressure of 200  mmHg.  Some dilatation of the balloon occurred.  Glue was mixed at this  point.  The balloon was withdrawn.  It was noted that the balloon  extended across the midline, and then the glue was inserted when it  reached the proper consistency into the vertebral body.  Good filling of  the ventral aspect of the inferior aspect and particularly the left side  of the vertebral body was obtained, and this is indeed the site of the  fracture.  This was filled with a total of 4.5 mL of glue until nearly  the entire central portion of the vertebral body was filled with cement.  At this point, the procedure was completed.  No extravasation of the  cement was identified.  Good vertebral height was maintained.  The area  was inspected in both  the AP and lateral projections.  There was no evidence of the tail of  the cement up the Jamshidi probe, and with this then, the skin was  closed with a singular inverted 3-0 Vicryl suture.  The patient  tolerated the procedure well and was returned to recovery room in stable  condition      Stefani Dama, M.D.  Electronically Signed     HJE/MEDQ  D:  07/10/2007  T:  07/12/2007  Job:  [id number]

## 2010-10-12 NOTE — H&P (Signed)
[name], [name]                ACCOUNT NO.:  [id number]   MEDICAL RECORD NO.:  [id number]          PATIENT TYPE:  INP   LOCATION:  0006                         FACILITY:  [facility]   PHYSICIAN:  Ollen Gross, M.D.    DATE OF BIRTH:  [birth year]   DATE OF ADMISSION:  02/12/2007  DATE OF DISCHARGE:                              HISTORY & PHYSICAL   Date of office visit, history and physical was February 06, 2007.   CHIEF COMPLAINT:  Right knee pain.   HISTORY OF PRESENT ILLNESS:  The patient is a 77 year old female who has  been seen by Dr. Lequita Halt for ongoing bilateral knee pain.  The right is  more symptomatic than the left.  She has been treated conservatively in  the past.  She is seen in the office and found to have significant bone-  on-bone patellofemoral, in the right knee worse in the left, advanced  disease, medial compartments, near bone-on-bone with spurring.  Despite  conservative measures she continues to have severe pain.  Risks and  benefits have been discussed and it is felt she would benefit from  undergoing surgical intervention.   ALLERGIES:  No known drug allergies.   INTOLERANCES:  Codeine, a little lightheadedness.   CURRENT MEDICATIONS:  1. Norvasc 10 mg.  2. Toprol 25 mg.  3. Maxzide 37.5 mg.  4. Lexapro 10 mg.  5. Ambien 10 mg.  6. Xanax p.r.n.   PAST MEDICAL HISTORY:  1. Hypertension.  2. Recent upper respiratory infection, improved.  3. Osteoporosis.  4. Osteoarthritis.  5. Degenerative disk disease.   PAST SURGICAL HISTORY:  1. Right knee arthroscopy.  2. Left knee arthroscopy.  3. Right hip arthroscopy.  4. Trigger finger procedure.  5. Hysterectomy.  6. Gallbladder surgery.   SOCIAL HISTORY:  Married, retired.  Nonsmoker.  No alcohol.  Two  children.  Husband will be assisting with care after surgery.  Six steps  going into her home.   FAMILY HISTORY:  Father with a history of stroke and heart disease.  Mother and brothers with heart  disease.  Has two sisters with diabetes.  Four sisters and three brothers with arthritis.   REVIEW OF SYSTEMS:  GENERAL:  No fevers, chills, night sweats.  NEURO:  No seizures. syncope or paralysis.  RESPIRATORY:  Recent upper  respiratory infection.  This is improved from 2 weeks ago.  Denies any  productive cough or hemoptysis.  CARDIOVASCULAR:  No chest pain, angina,  orthopnea.  GI: Some constipation.  No diarrhea, no nausea or vomiting.  GU: A little bit of urinary frequency.  No dysuria, hematuria.  MUSCULOSKELETAL:  Right knee.   PHYSICAL EXAM:  VITAL SIGNS:  Pulse 72, respirations 14, blood pressure  160/92.  GENERAL:  A 77 year old white female, well-nourished, well-developed, in  no acute distress, alert and oriented and cooperative.  Is a good  historian, very pleasant.  HEENT: Normocephalic, atraumatic.  Pupils equal, round and reactive.  Oropharynx clear.  EOMs intact.  NECK:  Supple.  CHEST:  Clear anterior and posterior chest walls.  No rhonchi, rales or  wheezing.  HEART:  Regular rhythm.  No murmur.  S1-S2 noted.  ABDOMEN:  Soft, nontender.  Bowel sounds present.  RECTAL, BREASTS, GENITALIA:  Not done and not pertinent to the present  illness.  EXTREMITIES:  Right knee:  No effusion, moderate crepitus noted.  Tender  more medial than lateral.   IMPRESSION:  Osteoarthritis, right knee.   PLAN:  The patient will be admitted to [facility] to undergo a  right total knee replacement arthroplasty.  Surgery will be performed by  Dr. Ollen Gross.      Alexzandrew L. Perkins, P.A.C.      Ollen Gross, M.D.  Electronically Signed    ALP/MEDQ  D:  02/11/2007  T:  02/12/2007  Job:  [id number]   cc:   Geoffry Paradise, M.D.  Fax: [phone]

## 2010-10-15 NOTE — Op Note (Signed)
[place]  Patient:    [name], [name]                      MRN: [id number] Proc. Date: 06/06/00 Attending:  Katy Fitch. Naaman Plummer., M.D. CC:         Rocco Serene, M.D.   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  PROCEDURES:  Incision and drainage of right long finger abscess and removal of a wood splinter foreign body.  SURGEON:  Katy Fitch. Sypher, Montez Hageman., M.D.  ASSISTANT:  Jonni Sanger, P.A.  ANESTHESIA:  0.25% Marcaine metacarpal head-level block, right long finger, _____ Dr. Krista Blue.  INDICATIONS:  [name] is a 77 year old woman who was referred by Dr. Donzetta Starch for evaluation of a chronic painful mass on the volar aspect of her right long finger PIP joint.  She has accidentally impaled her finger on something in her carpet on April 29, 2000.  She saw Dr. Yetta Barre for consult and was thought to have a retained foreign body with abscess formation.  She was referred to our office for follow-up hand surgery consultation.  Clinical examination in the office revealed what appeared to be a chronic abscess.  X-rays did not reveal a radiopaque foreign body.  Arrangements were made for incision an drainage at this time.  DESCRIPTION OF PROCEDURE:  [name] was brought to the operating room and placed in the supine position on the operating table.  Following light sedation, the right arm was prepped with Betadine soap and solution and sterilely draped.  Marcaine 0.25% was infiltrated at the metacarpal head level to obtain a digital block.  When anesthesia was satisfactory, the finger and arm were exsanguinated with an Esmarch bandage and  the arterial tourniquet placed at 220 mmHg.  The procedure commenced with a short oblique incision directly over the mass. Subcutaneous tissues were carefully divided, taking care to gently protect the radial proper digital nerve and artery.  A well-encapsulated abscess was identified.  This was incised longitudinally with immediate expression of purulent material, and a wood foreign body was noted.  This was passed off for gross identification but not formal pathology.  The abscess was then curetted clear of all purulent materials.  The wound was irrigated and dressed and will be allowed to close by secondary intention.  There were no apparent complications.  [name] was placed in a compressive dressing with Xeroflo, sterile gauze, and Coban.  She was advised to return to the office for follow-up for dressing change in five to seven days.  She was given a prescription for Darvocet-N 100 one to two tablets q.4-6h. p.r.n. pain.  As her wound has been left open, she has not been placed on antibiotics. DD:  06/07/99 TD:  06/06/00 Job: [id number]
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
## 2010-10-15 NOTE — Op Note (Signed)
[name], [name]                          ACCOUNT NO.:  [id number]   MEDICAL RECORD NO.:  [id number]                   PATIENT TYPE:  OIB   LOCATION:  2550                                 FACILITY:  [facility]   PHYSICIAN:  Loreta Ave, M.D.              DATE OF BIRTH:  16-Jul-1933   DATE OF PROCEDURE:  02/28/2003  DATE OF DISCHARGE:  02/28/2003                                 OPERATIVE REPORT   PREOPERATIVE DIAGNOSIS:  Post-traumatic chondromalacia and labrum tear,  right hip.   POSTOPERATIVE DIAGNOSIS:  Post-traumatic chondromalacia and labrum tear,  right hip.   OPERATIVE PROCEDURES:  1. Right hip examination under anesthesia, arthroscopy, with chondroplasty,     primarily acetabulum, superior lateral aspect.  2. Also debridement of labral tear.   SURGEON:  Oris Drone. Petrarca, P.A.-C.   ANESTHESIA:  General.   ESTIMATED BLOOD LOSS:  Minimal.   SPECIMENS:  None.   CULTURES:  None.   COMPLICATIONS:  None.   DRESSING:  Soft compressive.   DESCRIPTION OF PROCEDURE:  Patient brought to the operating room and after  adequate anesthesia had been obtained, she was placed on the fracture table,  lateral position, with appropriate padding and support.  Once appropriately  positioned, the fluoroscopic unit was utilized for visualization.  The hip  was isolated.  She was prepped and draped in the usual sterile fashion.  Traction applied to distract the hip about a centimeter to allow access,  confirmed fluoroscopically.  Two small incisions were made, anterolateral  and posterolateral just over the trochanter, avoiding neurovascular  structures.  With first a needle and then sequential dilatation, the hip was  entered with two cannulas.  Confirmed arthroscopically and fluoroscopically.  The arthroscope was introduced and the hip inspected.  Some diffuse grade 2  and 3 changes, mostly on the superior dome.  The most lateral aspect had  some grade 4 changes.  Complex  tearing, superior labrum, with degeneration  throughout that area.  Debrided to a stable surface, tapered into remaining  anterior and posterior labrum.  Chondral loose bodies and uneven surfaces  debrided.  Some grade 2-3 changes on the head.  At completion all loose  fragments and uneven surfaces and labral tears had been debrided.  The  entire hip examined utilizing both portals with the arthroscope and shaver,  changing these from front to back to allow complete visualization.  Instruments and fluid were removed after the fluid was drained and the hip  injected with Marcaine.  All traction was removed.  Wounds were then closed  with nylon and injected with  Marcaine.  Fluoroscopic confirmation of a nice reduction of the hip after  traction removed.  Taken off the fracture table.  Anesthesia reversed after  a dressing had been applied.  Brought to the recovery room.  Tolerated the  surgery well with no complications.  Loreta Ave, M.D.    DFM/MEDQ  D:  02/28/2003  T:  03/01/2003  Job:  [id number]

## 2010-10-15 NOTE — Consult Note (Signed)
NAMEMEILYN, HEINDL NO.:  [id number]   MEDICAL RECORD NO.:  [id number]          PATIENT TYPE:  INP   LOCATION:  2006                         FACILITY:  [facility]   PHYSICIAN:  Peter M. Swaziland, M.D.  DATE OF BIRTH:  Feb 20, 1934   DATE OF CONSULTATION:  10/02/2006  DATE OF DISCHARGE:                                 CONSULTATION   HISTORY OF PRESENT ILLNESS:  The patient is a pleasant, 77 year old,  white female seen for evaluation of chest pain.  The patient has a  history of mild hypercholesterolemia and hypertension.  She has a strong  family history of coronary disease.  She was well up until approximately  eight weeks ago when she developed pain in her right posterior thorax.  This led to an MRI of the spine which showed a compression fracture.  She subsequently had vertebroplasty approximately two weeks ago by Dr.  Danielle Dess.  Postoperatively, it was noted that blood pressure was elevated.  Since discharge, she notes that her blood pressure has been persistently  high.  Saturday evening, she complained of dizziness and developed  numbness her left face and left arm.  She also had burning and numbness  in her left leg, but she had some burning in her left leg even prior to  her vertebroplasty.  She also complained of chest discomfort that was a  heaviness.  She also had a band-like sensation around her chest and  noticed some difficulty taking a deep breath.  She was admitted and was  found to have severely elevated blood pressure greater than 200  systolic.  The patient does know that she had a normal stress Cardiolite  study two to three years ago with Dr. Katrinka Blazing.   PAST MEDICAL HISTORY:  1. Status post bilateral arthroscopic knee surgery.  2. Status post hysterectomy.  3. Status post cholecystectomy.  4. History of mild hypercholesterolemia.  5. History of hypertension.  6. She has had previous vertebroplasty.   ALLERGIES:  She has no known allergies.   MEDICAL DECISION MAKING:  1. Estraderm patch.  2. Xanax 0.25 mg daily.  3. Aspirin 81 mg per day.  4. Ambien CR 12.5 mg nightly.  5. Fish oil daily.  6. Calcium and vitamin D daily.   SOCIAL HISTORY:  The patient is married.  She has two children.  She  denies tobacco or alcohol use.   FAMILY HISTORY:  She has nine siblings, all of whom have had heart  disease.  Three brothers have had bypass surgery.  Her son had a heart  attack at age 75.  Both parents died of heart disease.  Her father died  at age 74, and her mother died in her 13s.   REVIEW OF SYSTEMS:  As noted in the HPI and is otherwise negative.   PHYSICAL EXAMINATION:  GENERAL:  The patient is a very pleasant white  female in no distress.  VITAL SIGNS:  Her blood pressure is now 150/84 after clonidine with  pulse 80 and regular.  She is afebrile.  HEENT:  She is normocephalic, atraumatic.  Pupils  equal, round and  reactive to light and accommodation.  Extraocular movements are full.  Oropharynx is clear.  NECK:  Supple without JVD, adenopathy, thyromegaly or bruits.  LUNGS:  Clear to auscultation and percussion.  CARDIAC:  Exam reveals regular rate and rhythm without murmur, rub or  gallop.  ABDOMEN:  Soft, nontender.  No hepatosplenomegaly, masses or bruits.  PULSES:  Femoral and pedal pulses 2+ and symmetric.  EXTREMITIES:  She has no edema.  NEUROLOGIC:  She is alert and oriented x3.  She has normal motor exam.  She has mild diminished sensation in her left arm.   ECG shows normal sinus rhythm with LVH and no acute changes.  Chest x-  ray showed no active disease.  CBC showed white count 7200, hemoglobin  14.4, hematocrit 43.7 and platelets 444,000.  Sodium 138, potassium 3.4,  chloride 102, CO2 29, BUN 15, creatinine 0.84, glucose 98.  Troponin  0.03.  CK 85 with negative MB.  BNP level is 37.  D-dimer was 1.75.   IMPRESSIONS:  1. Atypical chest pain associated with markedly elevated blood      pressure.   Given this association, need to rule out possible aortic      dissection.  She also has elevated D-dimer which would be      consistent with possible pulmonary embolus.  Given her recent      inactivity, this raises concern.  Given her strong family history,      also need to consider the possibility of anginal equivalent.  2. Accelerated hypertension.  3. Hypercholesterolemia.  4. Status post vertebroplasty.  5. Left facial and arm numbness.  Need to rule out CVA.   PLAN:  I agree with a blood pressure control with Toprol and p.r.n.  clonidine.  Will obtain CT of the chest with angiogram tonight.  Would  also consider cranial MRI/MRA to rule out CVA.  If her CT is negative,  may need to consider further evaluation for coronary status with either  Cardiolite study or possible cardiac catheterization.  If her enzymes  are all negative and her ECG remains normal, I would probably favor  Cardiolite study, but we will await her initial studies.           ______________________________  Peter M. Swaziland, M.D.     PMJ/MEDQ  D:  10/02/2006  T:  10/03/2006  Job:  [id number]   cc:   Geoffry Paradise, M.D.  Lyn Records, M.D.  Stefani Dama, M.D.

## 2010-10-15 NOTE — H&P (Signed)
NAMESHELONDA, SAXE                ACCOUNT NO.:  [id number]   MEDICAL RECORD NO.:  [id number]          PATIENT TYPE:  INP   LOCATION:  2006                         FACILITY:  [facility]   PHYSICIAN:  Mark A. Perini, M.D.   DATE OF BIRTH:  01-25-1934   DATE OF ADMISSION:  10/02/2006  DATE OF DISCHARGE:                              HISTORY & PHYSICAL   CHIEF COMPLAINT:  Chest pain.   HISTORY OF PRESENT ILLNESS:  Krystian is a pleasant 77 year old female with  a past history significant for degenerative disk disease in her back and  osteoporosis with a recent T8 vertebral fracture as well as  hyperlipidemia.  She had a kyphoplasty procedure performed a little over  a week ago.  She reports in the last 2-3 days she has had chest pain and  tightness and heaviness down in her chest.  It has been as severe as  8/10 in severity.  She has taken her husband's Toprol 25 mg each day for  the last 2 days.  She felt some tightness in her chest this morning and  felt as though she had some tingling in the left side of her face and as  though her left arm was falling asleep.  She also has had some pain,  tenderness, and soreness in her sternum and across her lower rib  margins, but this has been going on for a few weeks.  She reports some  dyspnea on exertion over the last few days which is a new symptom for  her.  She did feel hot, clammy, dizzy, and slightly presyncopal on  Saturday.  She denies any fever.  She has chronic constipation. She has  had no nausea or vomiting.   At the current time, she has a very slight heavy feeling in her chest.  She denies edema or reflux symptoms.  In the office, her EKG is  unchanged, and she is mildly hypertensive.  It is felt that she will  require admission for rule out of underlying coronary disease.   PAST MEDICAL HISTORY:  1. T8 vertebral fracture status post recent kyphoplasty.  2. Osteoporosis.  3. Hyperlipidemia with a good HDL level.  4. Postmenopausal.  5. Degenerative joint and disk disease of the back.   ALLERGIES:  None.   MEDICATIONS:  1. Estrogen patch.  2. Calcium with D daily.  3. Xanax 0.25 mg as needed.  4. Aspirin 81 mg.  5. Aleve or Tylenol as needed.  6. Ambien CR 12.5 mg each evening.  7. Garlic supplement.  8. Red rice yeast supplement.  9. Fish oil supplement.   SOCIAL HISTORY:  She is married for over 50 years.  Her husband, Lissa Hoard,  is with her.  She has never smoked and denies any alcohol or drug use.   FAMILY HISTORY:  Father died at age 23 of stroke.  His first sign of  cardiovascular disease was at age 20.  Mother died at age 31 of a  myocardial infarction.  She has five brothers, all who have had coronary  disease and bypass procedures.  She has  one son who is 12 who had an MI  at age 72.   PHYSICAL EXAMINATION:  VITAL SIGNS:  Blood pressure 150/90, pulse 88,  weight 123 pounds.  She is in no acute distress.  LUNGS:  Clear to auscultation bilaterally with no wheeze, rales, or  rhonchi.  HEART:  Regular rate and rhythm with no murmur, rub, or gallop.  There  are 1+ pulses in both femoral arteries.  There is no edema.  ABDOMEN:  Reveals normoactive bowel sounds.  There is some vague mild  tenderness throughout the abdomen.  There are no masses or  hepatosplenomegaly.  The lower rib margins on both sides are slight  tender.   LABORATORY DATA:  Pending at the time of this dictation.   ASSESSMENT AND PLAN:  Chest heaviness and chest pain symptoms over the  last 2-3 days.  EKG performed at this time shows normal sinus rhythm  with a rate of 80 beats per minute with left axis deviation and left  ventricular hypertrophy but no definite ST or T wave changes compared to  an EKG done 1 year ago.  As these symptoms could be related to  underlying heart disease, we will admit her to a telemetry bed.  We will  rule out for myocardial infarction with serial enzymes and  electrocardiograms.  We will treat with  aspirin and proton pump  inhibitor.  She may need heparin drip and nitroglycerin products as  well. We will ask for a cardiology consultation.  She is a full code  status.           ______________________________  Redge Gainer Waynard Edwards, M.D.     MAP/MEDQ  D:  10/02/2006  T:  10/02/2006  Job:  [id number]

## 2010-10-15 NOTE — Discharge Summary (Signed)
NAMEFILOMENA, [name]                ACCOUNT NO.:  [id number]   MEDICAL RECORD NO.:  [id number]          PATIENT TYPE:  INP   LOCATION:  1606                         FACILITY:  [facility]   PHYSICIAN:  Ollen Gross, M.D.    DATE OF BIRTH:  1933/08/12   DATE OF ADMISSION:  02/12/2007  DATE OF DISCHARGE:  02/15/2007                               DISCHARGE SUMMARY   ADMITTING DIAGNOSES:  1. Osteoarthritis right knee.  2. Hypertension.  3. Recent upper respiratory infection improved.  4. Osteoporosis.  5. Osteoarthritis.  6. Degenerative disk disease.   DISCHARGE DIAGNOSES:  1. Osteoarthritis right knee status post right total knee      arthroplasty.  2. Mild postop blood loss anemia.  Did not require transfusion.  3. Mild postop hyponatremia improved.  4. Hypertension.  5. Recent upper respiratory infection improved.  6. Osteoporosis.  7. Osteoarthritis.  8. Degenerative disk disease.   PROCEDURE:  February 12, 2007 right total knee.  Surgeon Dr. Lequita Halt,  assisted Avel Peace P.A.C.  Anesthesia spinal.   CONSULTATIONS:  Courtesy visit by Dr. Jacky Kindle.   BRIEF HISTORY:  [name] is a 77 year old female with end-stage  arthritis of the right knee with progressive worsening pain and  dysfunction, nonoperative  management, now presents for total knee  arthroplasty.   LABORATORY DATA:  Preop CBC showed hemoglobin 13.5, hematocrit 39.1,  white cell count 5.6, postop hemoglobin 10.7 drifted down to 9.5 back up  to 9.6 and 27.7.  PT/PTT preop 11.1 and 26, respectively.  INR 0.8.  Serial protimes followed.  PT/INR 28., 2.5.  Chem panel on admission all  within normal limits.  Serial BMET's are followed.  Sodium did drop from  139 to 134 and back up to 136.  Glucose went up from 82 to 202 back down  to 101.  Preop UA negative.  Blood group type O negative.  EKG September 21, 2006:  Normal sinus rhythm, possible left atrial enlargement, left  ventricular hypertrophy.  We cannot rule out  septal infarct age  undetermined confirmed by Dr. Charlies Constable.  Follow-up EKG Oct 02, 2006:  Normal sinus rhythm, left atrial enlargement, left ventricular  hypertrophy.  No significant changes since the last tracing September 21, 2006 performed by Dr. Othelia Pulling.   HOSPITAL COURSE:  The patient admitted to [facility].  Tolerated procedure well.  Later transferred to recovery room on  orthopedic floor and started on PCA and p.o. analgesics for pain  control.  Given Coumadin for DVT prophylaxis.  Given 24 hours postop IV  antibiotics.  Was having some pain on the evening of surgery.  Was doing  a little bit better on the morning of day #1.  Was using PCA.  Weaned  over to p.m. meds.  Was started back on her home medications including  triamterene, hydrochlorothiazide, and Norvasc.  She had elevated glucose  in her blood stream that was felt to be due to the D5 in the fluids so  we changed her to normal saline.  Had fairly decent output albeit on the  low  side so we gave her some gentle fluids to help out with urinary  output.  She was seen on day #2 as a courtesy by Dr. Jacky Kindle.  The  patient was doing well.  Started getting up with PT, and by day #2 she  was up and ambulating very well about 120 feet.  That was the day we  also changed the dressing.  Incision looked excellent.  Her serum  glucose was back down after removing the D5 from the fluids.  It was  felt to be all due to the dextrose.  That had improved, and she did well  with physical therapy was ready to go home by the following day of  postop day #3.   DISCHARGE PLAN:  1. The patient discharged home on February 15, 2007.  2. Discharge Diagnoses, please see above.  3. Discharge meds:  Coumadin, Percocet, Robaxin, Nu-Iron.  4. Follow-up two weeks.   ACTIVITY:  Weightbearing as tolerated.  Total knee protocol.  Home  health PT, home health nursing.   DISPOSITION:  Home.   CONDITION ON DISCHARGE:   Improved.      Alexzandrew L. Perkins, P.A.C.      Ollen Gross, M.D.  Electronically Signed    ALP/MEDQ  D:  03/15/2007  T:  03/16/2007  Job:  [id number]   cc:   Geoffry Paradise, M.D.  Fax: [phone]

## 2010-10-15 NOTE — Op Note (Signed)
[name], [name]                ACCOUNT NO.:  [id number]   MEDICAL RECORD NO.:  [id number]          PATIENT TYPE:  OIB   LOCATION:  3172                         FACILITY:  [facility]   PHYSICIAN:  Stefani Dama, M.D.  DATE OF BIRTH:  12-27-1933   DATE OF PROCEDURE:  09/21/2006  DATE OF DISCHARGE:                               OPERATIVE REPORT   PREOPERATIVE DIAGNOSIS:  Lumbar spondylosis with lumbar radiculopathy.   POSTOPERATIVE DIAGNOSIS:  Lumbar spondylosis with lumbar radiculopathy.   PROCEDURE:  Translaminar epidural steroid injection L3-L4, left.   INDICATIONS:  [name] is a 77 year old individual who has had  significant problems with back pain and leg pain in the past. She has  had previous epidural steroid injections.  She sustained a T8  compression fracture and was taken to the operating room to undergo  acrylic balloon kyphoplasty. While she is under anesthesia, she desired  to have an epidural steroid injection performed as this was being  planned electively on an outpatient basis.   DESCRIPTION OF PROCEDURE:  The patient was already asleep for her  kyphoplasty.  Her back had been prepped.  Fluoroscopic guidance was used  to localize the L3-L4 interspace on the dorsal aspect in the AP  projection.  The skin above this area was infiltrated with lidocaine for  a total of 1 mL then a standard 20 gauge spinal needle was used to  perforate into the epidural space using loss of resistance technique.  The space was confirmed with epidurography using 0.5 mL of 300 iohexol  concentrate dye.  When this was confirmed, then 2 mL of Depo-Medrol 40  mg/mL along with 2 mL of 1% preservative free lidocaine was injected  into the epidural space and the needle was withdrawn.  The patient  tolerated the procedure well and was returned to the recovery room in  stable condition.      Stefani Dama, M.D.  Electronically Signed     HJE/MEDQ  D:  09/21/2006  T:  09/21/2006   Job:  [id number]

## 2010-10-15 NOTE — Op Note (Signed)
[place]  Patient:    DERENDA, GIDDINGS                       MRN: [id number] Proc. Date: 11/25/99 Adm. Date:  [id number] Attending:  Colbert Ewing                           Operative Report  PREOPERATIVE DIAGNOSIS:  Triggering A-1 pulley, right thumb.  POSTOPERATIVE DIAGNOSIS:  Triggering A-1 pulley, right thumb, with markedly thickened A-1 pulley.  OPERATION PERFORMED:  Exploration and release of A-1 pulley, right thumb with partial excision of thickened hypertrophic pulley.  SURGEON:  Loreta Ave, M.D.  ASSISTANT:  Arlys John D. Petrarca, P.A.-C.  ANESTHESIA:  IV regional.  SPECIMENS:  None.  CULTURES:  None.  COMPLICATIONS:  None.  DRESSING:  Soft compressive with bulky hand dressing and splint.  DESCRIPTION OF PROCEDURE:  Patient was brought to the operating room and after adequate anesthesia had been obtained, prepped and draped in the usual sterile fashion.  A small transverse incision over the volar aspect of the A-1 pulley. Skin and subcutaneous tissues divided.  Neurovascular bundles identified, protected and retracted.  A-1 pulley released under direct visualization in its entirety.  Extremely thickened throughout out the central portion.  This was excised.  Some attrition within the tendon but no significant tearing but consistent with triggering.  All triggering obliterated at completion and no bolstering of the tendon.  The wound irrigated and closed at the skin with nylon and margins of the wound injected with Marcaine without epinephrine.  A sterile compressive dressing and splint applied.  Anesthesia reversed. Brought to recovery room.  Tolerated surgery well.  No complications. DD:  11/25/99 TD:  11/26/99 Job: [id number]

## 2010-10-15 NOTE — Op Note (Signed)
[name], [name]                ACCOUNT NO.:  [id number]   MEDICAL RECORD NO.:  [id number]          PATIENT TYPE:  AMB   LOCATION:  DSC                          FACILITY:  [facility]   PHYSICIAN:  Loreta Ave, M.D. DATE OF BIRTH:  1933/10/04   DATE OF PROCEDURE:  05/26/2005  DATE OF DISCHARGE:                                 OPERATIVE REPORT   PREOPERATIVE DIAGNOSIS:  Right knee medial and lateral meniscus tears with  lateral compartment and patellofemoral compartment with degenerative joint  disease.   POSTOPERATIVE DIAGNOSIS:  Right knee medial and lateral meniscus tears with  lateral compartment and patellofemoral compartment with degenerative joint  disease, with extensive grade 3 as well as grade 4 changes of the lateral  compartment and patellofemoral joint.  Also, chondrocalcinosis.   PROCEDURE:  Right knee examination under anesthesia, arthroscopy, extensive  lateral, lesser extent partial medial meniscectomy.  Chondroplasty of  lateral compartment and patellofemoral joint.   SURGEON:  Loreta Ave, M.D.   ASSISTANT:  Genene Churn. Denton Meek.   ANESTHESIA:  Knee block with sedation.   SPECIMENS:  None.   CULTURES:  None.   COMPLICATIONS:  None.   DRESSINGS:  Soft compressive.   DESCRIPTION OF PROCEDURE:  The patient was brought to the operating room and  placed on the operating table in the supine position.  After adequate  anesthesia had been obtained, knee examined.  Full motion.  Increased  valgus, stable ligaments.  Tibiofemoral lateral crepitus as well as  patellofemoral crepitus.  Tourniquet and leg holder applied.  Leg prepped  and draped in usual sterile fashion.   Three portals created, one superolateral, one each medial and lateral  parapatellar.  Inflow catheter introduced and knee distended.  Arthroscope  introduced.  Grade 2, 3, and 4 changes throughout patellofemoral joint.  Good tracking and stability.  Chondroplasty to disabled surface.  Much  of  the patellofemoral joint exposed bone, especially on the trochlea.  Cruciate  ligaments intact.  Medial meniscus degenerative complex tearing posterior  middle third saucerized out, leaving some of the meniscus all the way around  at completion.  Some chondrocalcinosis, but the medial compartment otherwise  did not look bad.  The lateral compartment had extensive complex displaced  tears almost the entirety of the lateral meniscus.  All of this removed.  Only meniscus able to be salvaged was a little bit in the posterior third  which was stable and intact and tapered __________.  Grade 3 and 4 changes  throughout.  Chondral loose bodies and flaps removed.  Unfortunately, she  has bone-on-bone changes over more than half of the compartment.  Entire  knee examined.  No other findings appreciated.  Instruments and fluid  removed.  Portals in  knee injected with Marcaine.  Portals closed with 4-0 nylon.  Sterile  compressive dressing applied.  Anesthesia reversed.   Brought to the recovery room.  Tolerated surgery well, no complications.      Loreta Ave, M.D.  Electronically Signed     DFM/MEDQ  D:  05/26/2005  T:  05/26/2005  Job:  [id number] 

## 2011-02-11 ENCOUNTER — Emergency Department (HOSPITAL_BASED_OUTPATIENT_CLINIC_OR_DEPARTMENT_OTHER)
Admission: EM | Admit: 2011-02-11 | Discharge: 2011-02-11 | Disposition: A | Discharge status: Home / Self Care | Payer: Medicare Other | Attending: Emergency Medicine | Admitting: Emergency Medicine

## 2011-02-11 ENCOUNTER — Encounter: Payer: Self-pay | Admitting: *Deleted

## 2011-02-11 DIAGNOSIS — I10 Essential (primary) hypertension: Secondary | ICD-10-CM | POA: Insufficient documentation

## 2011-02-11 DIAGNOSIS — R209 Unspecified disturbances of skin sensation: Secondary | ICD-10-CM | POA: Insufficient documentation

## 2011-02-11 DIAGNOSIS — Y92009 Unspecified place in unspecified non-institutional (private) residence as the place of occurrence of the external cause: Secondary | ICD-10-CM | POA: Insufficient documentation

## 2011-02-11 DIAGNOSIS — S01501A Unspecified open wound of lip, initial encounter: Secondary | ICD-10-CM | POA: Insufficient documentation

## 2011-02-11 DIAGNOSIS — W540XXA Bitten by dog, initial encounter: Secondary | ICD-10-CM | POA: Insufficient documentation

## 2011-02-11 DIAGNOSIS — S01511A Laceration without foreign body of lip, initial encounter: Secondary | ICD-10-CM

## 2011-02-11 HISTORY — DX: Essential (primary) hypertension: I10

## 2011-02-11 MED ORDER — AMOXICILLIN-POT CLAVULANATE 875-125 MG PO TABS
1.0000 | ORAL_TABLET | Freq: Once | ORAL | Status: AC
  Administered 2011-02-11: 1 via ORAL
  Filled 2011-02-11: qty 1

## 2011-02-11 MED ORDER — LIDOCAINE HCL (PF) 1 % IJ SOLN
5.0000 mL | Freq: Once | INTRAMUSCULAR | Status: AC
  Administered 2011-02-11: 5 mL

## 2011-02-11 MED ORDER — HYDROCODONE-ACETAMINOPHEN 5-325 MG PO TABS: 2.0000 | ORAL_TABLET | ORAL | Status: AC | PRN

## 2011-02-11 MED ORDER — AMOXICILLIN-POT CLAVULANATE 875-125 MG PO TABS: 1.0000 | ORAL_TABLET | Freq: Two times a day (BID) | ORAL | Status: AC

## 2011-02-11 MED ORDER — LIDOCAINE HCL (PF) 1 % IJ SOLN
INTRAMUSCULAR | Status: AC
  Administered 2011-02-11: 5 mL
  Filled 2011-02-11: qty 5

## 2011-02-11 MED ORDER — TETANUS-DIPHTH-ACELL PERTUSSIS 5-2.5-18.5 LF-MCG/0.5 IM SUSP
0.5000 mL | Freq: Once | INTRAMUSCULAR | Status: AC
  Administered 2011-02-11: 0.5 mL via INTRAMUSCULAR
  Filled 2011-02-11: qty 0.5

## 2011-02-11 NOTE — ED Provider Notes (Signed)
History     CSN: [id number] Arrival date & time: 02/11/2011  4:58 PM   Chief Complaint  Patient presents with  . Animal Bite     (Include location/radiation/quality/duration/timing/severity/associated sxs/prior treatment) Patient is a 77 y.o. female presenting with animal bite. The history is provided by the patient. No language interpreter was used.  Animal Bite  The incident occurred today. The incident occurred at home. She came to the ER via personal transport. There is an injury to the face and lip. The pain is moderate. It is unlikely that a foreign body is present. Associated symptoms include numbness. Pertinent negatives include no abdominal pain, no headaches and no light-headedness. There have been no prior injuries to these areas. Her tetanus status is out of date. There were no sick contacts. She has received no recent medical care.  Pt complains of being bitten by a daschound.  Pt has a laceration to the face and lip.   Past Medical History  Diagnosis Date  . Hypertension      Past Surgical History  Procedure Date  . Knee surgery   . Back surgery     No family history on file.  History  Substance Use Topics  . Smoking status: Never Smoker   . Smokeless tobacco: Not on file  . Alcohol Use: No    OB History    Grav Para Term Preterm Abortions TAB SAB Ect Mult Living                  Review of Systems  Gastrointestinal: Negative for abdominal pain.  Skin: Positive for wound.  Neurological: Positive for numbness. Negative for light-headedness and headaches.  All other systems reviewed and are negative.    Allergies  Review of patient's allergies indicates no known allergies.  Home Medications   Current Outpatient Rx  Name Route Sig Dispense Refill  . ALPRAZOLAM 0.25 MG PO TABS Oral Take 0.25 mg by mouth 3 (three) times daily as needed. Restless leg     . AMLODIPINE BESYLATE 10 MG PO TABS Oral Take 10 mg by mouth daily.      Marland Kitchen CALCIUM CARBONATE  1250 MG PO TABS Oral Take 1 tablet by mouth daily.      . DEXLANSOPRAZOLE 60 MG PO CPDR Oral Take 60 mg by mouth daily.      Marland Kitchen ESCITALOPRAM OXALATE 20 MG PO TABS Oral Take 20 mg by mouth daily.      Marland Kitchen LEVOTHYROXINE SODIUM 25 MCG PO TABS Oral Take 25 mcg by mouth daily.      . METHYLCELLULOSE 1 % OP SOLN Both Eyes Place 1 drop into both eyes 2 (two) times daily.      Marland Kitchen METOPROLOL SUCCINATE 25 MG PO TB24 Oral Take 25 mg by mouth daily.      Marland Kitchen ONE-DAILY MULTI VITAMINS PO TABS Oral Take 1 tablet by mouth daily.      . TRIAMTERENE-HCTZ 37.5-25 MG PO TABS Oral Take 1 tablet by mouth daily.      Marland Kitchen VITAMIN C 500 MG PO TABS Oral Take 500 mg by mouth daily.      Marland Kitchen VITAMIN D (ERGOCALCIFEROL) 50000 UNITS PO CAPS Oral Take 50,000 Units by mouth every 7 (seven) days. Take on Friday     . ZOLPIDEM TARTRATE 10 MG PO TABS Oral Take 10 mg by mouth at bedtime.        Physical Exam    BP 159/78  Pulse 72  Temp(Src) 98 F (36.7  C) (Oral)  Resp 20  SpO2 99%  Physical Exam  Nursing note and vitals reviewed. Constitutional: She appears well-developed.  HENT:  Head: Normocephalic.       Irregular jagged laceration lip with tissue defect to lip and face, thru the vermillion border.   Eyes: Conjunctivae and EOM are normal. Pupils are equal, round, and reactive to light.  Neck: Normal range of motion.  Cardiovascular: Normal rate.   Pulmonary/Chest: Effort normal.  Abdominal: Soft.  Musculoskeletal: Normal range of motion.  Neurological: She is alert.  Skin: There is erythema.  Psychiatric: She has a normal mood and affect.    ED Course  Wound repair Date/Time: 02/11/2011 7:21 PM Performed by: Langston Masker Authorized by: Dayton Bailiff Consent: Verbal consent obtained. Risks and benefits: risks, benefits and alternatives were discussed Consent given by: patient Patient understanding: patient states understanding of the procedure being performed Patient identity confirmed: verbally with patient Time  out: Immediately prior to procedure a "time out" was called to verify the correct patient, procedure, equipment, support staff and site/side marked as required. Preparation: Patient was prepped and draped in the usual sterile fashion. Local anesthesia used: yes Anesthesia: local infiltration Patient sedated: no Patient tolerance: Patient tolerated the procedure well with no immediate complications.  LACERATION REPAIR Date/Time: 02/11/2011 7:24 PM Performed by: Langston Masker Authorized by: Dayton Bailiff Consent: Verbal consent obtained. Risks and benefits: risks, benefits and alternatives were discussed Consent given by: patient Patient understanding: patient states understanding of the procedure being performed Required items: required blood products, implants, devices, and special equipment available Patient identity confirmed: verbally with patient Body area: head/neck Laceration length: 2 cm Contamination: The wound is contaminated. Tendon involvement: superficial Nerve involvement: none Vascular damage: no Anesthesia: local infiltration Patient sedated: no Preparation: Patient was prepped and draped in the usual sterile fashion. Irrigation solution: saline Amount of cleaning: standard Debridement: minimal Degree of undermining: minimal Skin closure: 6-0 nylon Number of sutures: 7 Technique: simple Approximation: close Approximation difficulty: complex Patient tolerance: Patient tolerated the procedure well with no immediate complications. Comments: Complicated closure,  Defect in vermillion border repaired,     Results for orders placed during the hospital encounter of 03/25/10  TYPE AND SCREEN      Component Value Range   ABO/RH(D) O NEG     Antibody Screen NEG     Sample Expiration 03/27/2010    ABO/RH      Component Value Range   ABO/RH(D) O NEG    BASIC METABOLIC PANEL      Component Value Range   Sodium 138  135 - 145 (mEq/L)   Potassium 3.8  3.5 - 5.1  (mEq/L)   Chloride 101  96 - 112 (mEq/L)   CO2 31  19 - 32 (mEq/L)   Glucose, Bld 87  70 - 99 (mg/dL)   BUN 20  6 - 23 (mg/dL)   Creatinine, Ser 1.19  0.4 - 1.2 (mg/dL)   Calcium 14.7  8.4 - 10.5 (mg/dL)   GFR calc non Af Amer 56 (*) >60 (mL/min)   GFR calc Af Amer    >60 (mL/min)   Value: >60            The eGFR has been calculated     using the MDRD equation.     This calculation has not been     validated in all clinical     situations.     eGFR's persistently     <60 mL/min signify  possible Chronic Kidney Disease.  CBC      Component Value Range   WBC 5.3  4.0 - 10.5 (K/uL)   RBC 4.39  3.87 - 5.11 (MIL/uL)   Hemoglobin 14.3  12.0 - 15.0 (g/dL)   HCT 16.1  09.6 - 04.5 (%)   MCV 94.3  78.0 - 100.0 (fL)   MCH 32.6  26.0 - 34.0 (pg)   MCHC 34.5  30.0 - 36.0 (g/dL)   RDW 40.9  81.1 - 91.4 (%)   Platelets 288  150 - 400 (K/uL)  SURGICAL PCR SCREEN      Component Value Range   MRSA, PCR NEGATIVE  NEGATIVE    Staphylococcus aureus    NEGATIVE    Value: NEGATIVE            The Xpert SA Assay (FDA     approved for NASAL specimens     only), is one component of     a comprehensive surveillance     program.  It is not intended     to diagnose infection nor to     guide or monitor treatment.   No results found.   No diagnosis found.   MDM Pt counseled on vermillion border laceration and tissue defect.   Pt understand scarring issues.       Langston Masker, [place] 02/11/11 [phone]

## 2011-02-11 NOTE — ED Notes (Signed)
Bit in the face by a friends dog. The dog is UTD on its rabies vaccines.

## 2011-02-11 NOTE — ED Notes (Signed)
[facility] Deputy returned my call, was given pt's telephone numbers and sts he will call her back.

## 2011-02-11 NOTE — ED Notes (Signed)
Patient is resting comfortably. 

## 2011-02-11 NOTE — ED Notes (Signed)
[facility] [employer] notified of animal bite and will have an officer call or come to ER.

## 2011-02-11 NOTE — ED Provider Notes (Signed)
Medical screening examination/treatment/procedure(s) were performed by non-physician practitioner and as supervising physician I was immediately available for consultation/collaboration.   Dayton Bailiff, MD 02/11/11 [phone]

## 2011-02-11 NOTE — ED Notes (Signed)
Family at bedside. 

## 2011-02-18 ENCOUNTER — Emergency Department (HOSPITAL_BASED_OUTPATIENT_CLINIC_OR_DEPARTMENT_OTHER)
Admission: EM | Admit: 2011-02-18 | Discharge: 2011-02-18 | Disposition: A | Discharge status: Home / Self Care | Payer: Medicare Other | Attending: Emergency Medicine | Admitting: Emergency Medicine

## 2011-02-18 ENCOUNTER — Encounter (HOSPITAL_BASED_OUTPATIENT_CLINIC_OR_DEPARTMENT_OTHER): Payer: Self-pay

## 2011-02-18 DIAGNOSIS — IMO0002 Reserved for concepts with insufficient information to code with codable children: Secondary | ICD-10-CM

## 2011-02-18 DIAGNOSIS — I10 Essential (primary) hypertension: Secondary | ICD-10-CM | POA: Insufficient documentation

## 2011-02-18 DIAGNOSIS — Z4802 Encounter for removal of sutures: Secondary | ICD-10-CM | POA: Insufficient documentation

## 2011-02-18 LAB — CBC
HCT: 37.6
Hemoglobin: 12.8
MCHC: 34.1
MCV: 91.7
Platelets: 293
RBC: 4.1
RDW: 13.9
WBC: 5.3

## 2011-02-18 LAB — BASIC METABOLIC PANEL
BUN: 18
CO2: 30
Calcium: 9.5
Chloride: 104
Creatinine, Ser: 0.88
GFR calc Af Amer: >60
GFR calc non Af Amer: >60
Glucose, Bld: 93
Potassium: 3.9
Sodium: 139

## 2011-02-18 NOTE — ED Provider Notes (Signed)
History     CSN: [id number] Arrival date & time: 02/18/2011 12:06 PM  Chief Complaint  Patient presents with  . Suture / Staple Removal    HPI  (Consider location/radiation/quality/duration/timing/severity/associated sxs/prior treatment)  Patient is a 77 y.o. female presenting with suture removal. The history is provided by the patient. No language interpreter was used.  Suture / Staple Removal  The sutures were placed 3 to 6 days ago. Treatments since wound repair include oral antibiotics. The redness has improved. There is no swelling present. The pain has no pain.  Pt reports area is doing well  Past Medical History  Diagnosis Date  . Hypertension     Past Surgical History  Procedure Date  . Knee surgery   . Back surgery     No family history on file.  History  Substance Use Topics  . Smoking status: Never Smoker   . Smokeless tobacco: Not on file  . Alcohol Use: No    OB History    Grav Para Term Preterm Abortions TAB SAB Ect Mult Living                  Review of Systems  Review of Systems  All other systems reviewed and are negative.    Allergies  Review of patient's allergies indicates no known allergies.  Home Medications   Current Outpatient Rx  Name Route Sig Dispense Refill  . ALPRAZOLAM 0.25 MG PO TABS Oral Take 0.25 mg by mouth 3 (three) times daily as needed. Restless leg     . AMLODIPINE BESYLATE 10 MG PO TABS Oral Take 10 mg by mouth daily.      . AMOXICILLIN-POT CLAVULANATE 875-125 MG PO TABS Oral Take 1 tablet by mouth 2 (two) times daily. 14 tablet 0  . CALCIUM CARBONATE 1250 MG PO TABS Oral Take 1 tablet by mouth daily.      . DEXLANSOPRAZOLE 60 MG PO CPDR Oral Take 60 mg by mouth daily.      Marland Kitchen ESCITALOPRAM OXALATE 20 MG PO TABS Oral Take 20 mg by mouth daily.      Marland Kitchen HYDROCODONE-ACETAMINOPHEN 5-325 MG PO TABS Oral Take 2 tablets by mouth every 4 (four) hours as needed for pain. 14 tablet 0  . LEVOTHYROXINE SODIUM 25 MCG PO TABS Oral  Take 25 mcg by mouth daily.      . METHYLCELLULOSE 1 % OP SOLN Both Eyes Place 1 drop into both eyes 2 (two) times daily.      Marland Kitchen METOPROLOL SUCCINATE 25 MG PO TB24 Oral Take 25 mg by mouth daily.      Marland Kitchen ONE-DAILY MULTI VITAMINS PO TABS Oral Take 1 tablet by mouth daily.      . TRIAMTERENE-HCTZ 37.5-25 MG PO TABS Oral Take 1 tablet by mouth daily.      Marland Kitchen VITAMIN C 500 MG PO TABS Oral Take 500 mg by mouth daily.      Marland Kitchen VITAMIN D (ERGOCALCIFEROL) 50000 UNITS PO CAPS Oral Take 50,000 Units by mouth every 7 (seven) days. Take on Friday     . ZOLPIDEM TARTRATE 10 MG PO TABS Oral Take 10 mg by mouth at bedtime.        Physical Exam    BP 133/91  Pulse 62  Temp(Src) 98.4 F (36.9 C) (Oral)  Resp 16  SpO2 100%  Physical Exam  Nursing note and vitals reviewed. Constitutional: She appears well-developed and well-nourished.  HENT:  Head: Normocephalic.  Healing laceration,  Well approximated,      ED Course  Procedures (including critical care time)  Labs Reviewed - No data to display No results found.   1. Dressing change/suture removal      MDM Sutures removed by me.  I counseled pt on scaring.  Wound looks good even with the amount of tissue defect she had.         Langston Masker, [place] 02/18/11 1328

## 2011-02-18 NOTE — ED Notes (Signed)
Here for suture removal

## 2011-02-21 ENCOUNTER — Encounter (HOSPITAL_BASED_OUTPATIENT_CLINIC_OR_DEPARTMENT_OTHER): Payer: Self-pay

## 2011-02-22 ENCOUNTER — Other Ambulatory Visit: Payer: Self-pay | Admitting: Dermatology

## 2011-02-25 NOTE — ED Provider Notes (Signed)
Medical screening examination/treatment/procedure(s) were performed by non-physician practitioner and as supervising physician I was immediately available for consultation/collaboration.  Dannetta Lekas, MD 02/25/11 0531 

## 2011-03-08 LAB — BASIC METABOLIC PANEL
BUN: 13
CO2: 29
Calcium: 9.7
Chloride: 99
Creatinine, Ser: 0.74
GFR calc Af Amer: >60
GFR calc non Af Amer: >60
Glucose, Bld: 93
Potassium: 3.9
Sodium: 137

## 2011-03-08 LAB — CBC
HCT: 38.1
Hemoglobin: 12.7
MCHC: 33.4
MCV: 94.3
Platelets: 384
RBC: 4.04
RDW: 13.3
WBC: 4.9

## 2011-03-10 LAB — BASIC METABOLIC PANEL
BUN: 10
BUN: 6
BUN: 7
CO2: 30
CO2: 30
CO2: 32
Calcium: 8.4
Calcium: 8.5
Calcium: 8.5
Chloride: 101
Chloride: 99
Chloride: 99
Creatinine, Ser: 0.58
Creatinine, Ser: 0.62
Creatinine, Ser: 0.71
GFR calc Af Amer: >60
GFR calc Af Amer: >60
GFR calc Af Amer: >60
GFR calc non Af Amer: >60
GFR calc non Af Amer: >60
GFR calc non Af Amer: >60
Glucose, Bld: 101 — ABNORMAL HIGH
Glucose, Bld: 122 — ABNORMAL HIGH
Glucose, Bld: 202 — ABNORMAL HIGH
Potassium: 3.5
Potassium: 3.7
Potassium: 3.9
Sodium: 134 — ABNORMAL LOW
Sodium: 136
Sodium: 137

## 2011-03-10 LAB — CBC
HCT: 27.7 — ABNORMAL LOW
HCT: 27.7 — ABNORMAL LOW
HCT: 30.7 — ABNORMAL LOW
Hemoglobin: 10.7 — ABNORMAL LOW
Hemoglobin: 9.5 — ABNORMAL LOW
Hemoglobin: 9.6 — ABNORMAL LOW
MCHC: 34.5
MCHC: 34.7
MCHC: 34.7
MCV: 93.6
MCV: 93.7
MCV: 93.8
Platelets: 232
Platelets: 258
Platelets: 280
RBC: 2.95 — ABNORMAL LOW
RBC: 2.96 — ABNORMAL LOW
RBC: 3.28 — ABNORMAL LOW
RDW: 13.4
RDW: 13.4
RDW: 13.8
WBC: 5.9
WBC: 6.4
WBC: 7.3

## 2011-03-10 LAB — PROTIME-INR
INR: 1
INR: 1.4
INR: 2.5 — ABNORMAL HIGH
Prothrombin Time: 13.1
Prothrombin Time: 17.6 — ABNORMAL HIGH
Prothrombin Time: 28.1 — ABNORMAL HIGH

## 2011-03-10 LAB — ABO/RH: ABO/RH(D): O NEG

## 2011-03-10 LAB — TYPE AND SCREEN
ABO/RH(D): O NEG
Antibody Screen: NEGATIVE

## 2011-03-11 LAB — COMPREHENSIVE METABOLIC PANEL
ALT: 35
AST: 35
Albumin: 3.6
Alkaline Phosphatase: 95
BUN: 15
CO2: 32
Calcium: 9.8
Chloride: 104
Creatinine, Ser: 0.89
GFR calc Af Amer: >60
GFR calc non Af Amer: >60
Glucose, Bld: 82
Potassium: 4.3
Sodium: 139
Total Bilirubin: 0.9
Total Protein: 6.7

## 2011-03-11 LAB — URINALYSIS, ROUTINE W REFLEX MICROSCOPIC
Bilirubin Urine: NEGATIVE
Glucose, UA: NEGATIVE
Hgb urine dipstick: NEGATIVE
Ketones, ur: NEGATIVE
Nitrite: NEGATIVE
Protein, ur: NEGATIVE
Specific Gravity, Urine: 1.009
Urobilinogen, UA: 0.2
pH: 7.5

## 2011-03-11 LAB — PROTIME-INR
INR: 0.8
Prothrombin Time: 11.1 — ABNORMAL LOW

## 2011-03-11 LAB — CBC
HCT: 39.1
Hemoglobin: 13.5
MCHC: 34.5
MCV: 93.4
Platelets: 391
RBC: 4.18
RDW: 13.5
WBC: 5.6

## 2011-03-11 LAB — APTT: aPTT: 26

## 2011-06-07 DIAGNOSIS — B351 Tinea unguium: Secondary | ICD-10-CM | POA: Diagnosis not present

## 2011-06-07 DIAGNOSIS — M79609 Pain in unspecified limb: Secondary | ICD-10-CM | POA: Diagnosis not present

## 2011-06-16 DIAGNOSIS — M47817 Spondylosis without myelopathy or radiculopathy, lumbosacral region: Secondary | ICD-10-CM | POA: Diagnosis not present

## 2011-06-16 DIAGNOSIS — M545 Low back pain, unspecified: Secondary | ICD-10-CM | POA: Diagnosis not present

## 2011-06-16 DIAGNOSIS — IMO0002 Reserved for concepts with insufficient information to code with codable children: Secondary | ICD-10-CM | POA: Diagnosis not present

## 2011-06-27 DIAGNOSIS — H5200 Hypermetropia, unspecified eye: Secondary | ICD-10-CM | POA: Diagnosis not present

## 2011-06-27 DIAGNOSIS — H2510 Age-related nuclear cataract, unspecified eye: Secondary | ICD-10-CM | POA: Diagnosis not present

## 2011-07-25 ENCOUNTER — Other Ambulatory Visit: Payer: Self-pay | Admitting: Gastroenterology

## 2011-07-25 DIAGNOSIS — R141 Gas pain: Secondary | ICD-10-CM | POA: Diagnosis not present

## 2011-07-25 DIAGNOSIS — R109 Unspecified abdominal pain: Secondary | ICD-10-CM | POA: Diagnosis not present

## 2011-07-25 DIAGNOSIS — R143 Flatulence: Secondary | ICD-10-CM | POA: Diagnosis not present

## 2011-07-26 ENCOUNTER — Ambulatory Visit
Admission: RE | Admit: 2011-07-26 | Discharge: 2011-07-26 | Disposition: A | Discharge status: Home / Self Care | Payer: Medicare Other | Source: Ambulatory Visit | Attending: Gastroenterology | Admitting: Gastroenterology

## 2011-07-26 ENCOUNTER — Other Ambulatory Visit: Payer: Self-pay | Admitting: Gastroenterology

## 2011-07-26 DIAGNOSIS — R1084 Generalized abdominal pain: Secondary | ICD-10-CM | POA: Diagnosis not present

## 2011-07-26 DIAGNOSIS — K5900 Constipation, unspecified: Secondary | ICD-10-CM | POA: Diagnosis not present

## 2011-07-26 DIAGNOSIS — K449 Diaphragmatic hernia without obstruction or gangrene: Secondary | ICD-10-CM | POA: Diagnosis not present

## 2011-07-26 MED ORDER — IOHEXOL 300 MG/ML  SOLN
100.0000 mL | Freq: Once | INTRAMUSCULAR | Status: AC | PRN
  Administered 2011-07-26: 100 mL via INTRAVENOUS

## 2011-07-28 ENCOUNTER — Other Ambulatory Visit: Payer: Medicare Other

## 2011-08-09 DIAGNOSIS — IMO0002 Reserved for concepts with insufficient information to code with codable children: Secondary | ICD-10-CM | POA: Insufficient documentation

## 2011-08-09 DIAGNOSIS — M810 Age-related osteoporosis without current pathological fracture: Secondary | ICD-10-CM | POA: Insufficient documentation

## 2011-08-09 DIAGNOSIS — N951 Menopausal and female climacteric states: Secondary | ICD-10-CM | POA: Insufficient documentation

## 2011-08-09 DIAGNOSIS — N3281 Overactive bladder: Secondary | ICD-10-CM | POA: Insufficient documentation

## 2011-08-15 DIAGNOSIS — IMO0002 Reserved for concepts with insufficient information to code with codable children: Secondary | ICD-10-CM | POA: Diagnosis not present

## 2011-08-15 DIAGNOSIS — M47817 Spondylosis without myelopathy or radiculopathy, lumbosacral region: Secondary | ICD-10-CM | POA: Diagnosis not present

## 2011-08-15 DIAGNOSIS — Z981 Arthrodesis status: Secondary | ICD-10-CM | POA: Diagnosis not present

## 2011-08-18 ENCOUNTER — Ambulatory Visit (INDEPENDENT_AMBULATORY_CARE_PROVIDER_SITE_OTHER): Payer: Medicare Other | Admitting: Obstetrics and Gynecology

## 2011-08-18 ENCOUNTER — Encounter: Payer: Self-pay | Admitting: Obstetrics and Gynecology

## 2011-08-18 VITALS — BP 122/70 | Ht 61.0 in | Wt 138.0 lb

## 2011-08-18 DIAGNOSIS — I10 Essential (primary) hypertension: Secondary | ICD-10-CM | POA: Diagnosis not present

## 2011-08-18 DIAGNOSIS — Z780 Asymptomatic menopausal state: Secondary | ICD-10-CM

## 2011-08-18 DIAGNOSIS — R350 Frequency of micturition: Secondary | ICD-10-CM | POA: Diagnosis not present

## 2011-08-18 DIAGNOSIS — I2510 Atherosclerotic heart disease of native coronary artery without angina pectoris: Secondary | ICD-10-CM | POA: Diagnosis not present

## 2011-08-18 DIAGNOSIS — E785 Hyperlipidemia, unspecified: Secondary | ICD-10-CM | POA: Diagnosis not present

## 2011-08-18 DIAGNOSIS — M810 Age-related osteoporosis without current pathological fracture: Secondary | ICD-10-CM

## 2011-08-18 DIAGNOSIS — N949 Unspecified condition associated with female genital organs and menstrual cycle: Secondary | ICD-10-CM | POA: Diagnosis not present

## 2011-08-18 DIAGNOSIS — N951 Menopausal and female climacteric states: Secondary | ICD-10-CM | POA: Diagnosis not present

## 2011-08-18 DIAGNOSIS — R3915 Urgency of urination: Secondary | ICD-10-CM

## 2011-08-18 DIAGNOSIS — K219 Gastro-esophageal reflux disease without esophagitis: Secondary | ICD-10-CM | POA: Diagnosis not present

## 2011-08-18 DIAGNOSIS — N898 Other specified noninflammatory disorders of vagina: Secondary | ICD-10-CM

## 2011-08-18 LAB — WET PREP FOR TRICH, YEAST, CLUE
Clue Cells Wet Prep HPF POC: NONE SEEN
Trich, Wet Prep: NONE SEEN
Yeast Wet Prep HPF POC: NONE SEEN

## 2011-08-18 MED ORDER — METRONIDAZOLE 0.75 % VA GEL: VAGINAL | Status: DC

## 2011-08-18 MED ORDER — ESTRADIOL 0.025 MG/24HR TD PTTW: 1.0000 | MEDICATED_PATCH | TRANSDERMAL | Status: DC

## 2011-08-18 NOTE — Progress Notes (Signed)
Patient came to see me today for further followup. She remains on hormone replacement for menopausal symptoms. She's using a low dose patch. She uses intermittently. She goes long periods without  having to use it. She is going to have her mammogram next week. Although she still has urinary frequency and some urgency she has done better since she had her sling procedure in 2011. She used to have a lot of incontinence as well and that has resolved. She has been treated with Forteo for 2 years for osteoporosis. The diagnosis was based on 3 vertebral fractures even though her bone density showed osteopenia. She is scheduled for followup bone density. She will do that at her PCPs office. She is having no vaginal bleeding or pelvic pain. She is having a vaginal discharge with some odor and itching. She is susceptible to vaginal yeast infections. She has used Diflucan in the past with good results.  ROS: 12 systems review done. Pertinent positives above.  HEENT: Within normal limits. Kennon Portela present. Neck: No masses. Supraclavicular lymph nodes: Not enlarged. Breasts: Examined in both sitting and lying position. Symmetrical without skin changes or masses. Abdomen: Soft no masses guarding or rebound. No hernias. Pelvic: External within normal limits. BUS within normal limits. Vaginal examination shows good estrogen effect, no cystocele enterocele or rectocele. Cervix and uterus absent. Adnexa within normal limits. Rectovaginal confirmatory. Extremities within normal limits. Wet prep shows too numerous to count bacteria.  Assessment: #1. Menopausal symptoms #2. Bacterial vaginosis #3. Detrussor instability #4. Osteoporosis  Plan:continue Vivelle dot patch 0.025 mg-use intermittently. Mammogram. Vaginal vaginal cream. Observation of DI.

## 2011-08-19 LAB — URINALYSIS W MICROSCOPIC + REFLEX CULTURE
Bacteria, UA: NONE SEEN
Bilirubin Urine: NEGATIVE
Casts: NONE SEEN
Crystals: NONE SEEN
Glucose, UA: NEGATIVE mg/dL
Hgb urine dipstick: NEGATIVE
Ketones, ur: NEGATIVE mg/dL
Leukocytes, UA: NEGATIVE
Nitrite: NEGATIVE
Protein, ur: NEGATIVE mg/dL
Specific Gravity, Urine: 1.019 (ref 1.005–1.030)
Squamous Epithelial / LPF: NONE SEEN
Urobilinogen, UA: 0.2 mg/dL (ref 0.0–1.0)
pH: 5.5 (ref 5.0–8.0)

## 2011-08-23 ENCOUNTER — Encounter: Payer: Self-pay | Admitting: Obstetrics and Gynecology

## 2011-08-23 DIAGNOSIS — Z1231 Encounter for screening mammogram for malignant neoplasm of breast: Secondary | ICD-10-CM | POA: Diagnosis not present

## 2011-08-23 DIAGNOSIS — L821 Other seborrheic keratosis: Secondary | ICD-10-CM | POA: Diagnosis not present

## 2011-08-23 DIAGNOSIS — L570 Actinic keratosis: Secondary | ICD-10-CM | POA: Diagnosis not present

## 2011-08-23 DIAGNOSIS — Z803 Family history of malignant neoplasm of breast: Secondary | ICD-10-CM | POA: Diagnosis not present

## 2011-08-23 DIAGNOSIS — Z85828 Personal history of other malignant neoplasm of skin: Secondary | ICD-10-CM | POA: Diagnosis not present

## 2011-09-13 DIAGNOSIS — M949 Disorder of cartilage, unspecified: Secondary | ICD-10-CM | POA: Diagnosis not present

## 2011-09-13 DIAGNOSIS — M899 Disorder of bone, unspecified: Secondary | ICD-10-CM | POA: Diagnosis not present

## 2011-09-26 DIAGNOSIS — R143 Flatulence: Secondary | ICD-10-CM | POA: Diagnosis not present

## 2011-09-26 DIAGNOSIS — K5901 Slow transit constipation: Secondary | ICD-10-CM | POA: Diagnosis not present

## 2011-09-26 DIAGNOSIS — Z800 Family history of malignant neoplasm of digestive organs: Secondary | ICD-10-CM | POA: Diagnosis not present

## 2011-09-26 DIAGNOSIS — R141 Gas pain: Secondary | ICD-10-CM | POA: Diagnosis not present

## 2011-09-26 DIAGNOSIS — K219 Gastro-esophageal reflux disease without esophagitis: Secondary | ICD-10-CM | POA: Diagnosis not present

## 2011-09-26 DIAGNOSIS — Z86010 Personal history of colonic polyps: Secondary | ICD-10-CM | POA: Diagnosis not present

## 2011-09-26 DIAGNOSIS — R142 Eructation: Secondary | ICD-10-CM | POA: Diagnosis not present

## 2011-10-04 DIAGNOSIS — Z981 Arthrodesis status: Secondary | ICD-10-CM | POA: Diagnosis not present

## 2011-10-04 DIAGNOSIS — M47817 Spondylosis without myelopathy or radiculopathy, lumbosacral region: Secondary | ICD-10-CM | POA: Diagnosis not present

## 2011-10-14 DIAGNOSIS — M1710 Unilateral primary osteoarthritis, unspecified knee: Secondary | ICD-10-CM | POA: Diagnosis not present

## 2011-10-14 DIAGNOSIS — IMO0002 Reserved for concepts with insufficient information to code with codable children: Secondary | ICD-10-CM | POA: Diagnosis not present

## 2011-10-20 ENCOUNTER — Other Ambulatory Visit: Payer: Self-pay | Admitting: Gastroenterology

## 2011-10-20 DIAGNOSIS — Q398 Other congenital malformations of esophagus: Secondary | ICD-10-CM | POA: Diagnosis not present

## 2011-10-20 DIAGNOSIS — D126 Benign neoplasm of colon, unspecified: Secondary | ICD-10-CM | POA: Diagnosis not present

## 2011-10-20 DIAGNOSIS — K219 Gastro-esophageal reflux disease without esophagitis: Secondary | ICD-10-CM | POA: Diagnosis not present

## 2011-10-20 DIAGNOSIS — K5730 Diverticulosis of large intestine without perforation or abscess without bleeding: Secondary | ICD-10-CM | POA: Diagnosis not present

## 2011-10-20 DIAGNOSIS — Z86010 Personal history of colonic polyps: Secondary | ICD-10-CM | POA: Diagnosis not present

## 2011-10-20 DIAGNOSIS — Z800 Family history of malignant neoplasm of digestive organs: Secondary | ICD-10-CM | POA: Diagnosis not present

## 2011-10-20 DIAGNOSIS — K2940 Chronic atrophic gastritis without bleeding: Secondary | ICD-10-CM | POA: Diagnosis not present

## 2011-10-20 DIAGNOSIS — Z09 Encounter for follow-up examination after completed treatment for conditions other than malignant neoplasm: Secondary | ICD-10-CM | POA: Diagnosis not present

## 2011-10-25 ENCOUNTER — Other Ambulatory Visit: Payer: Self-pay | Admitting: Neurological Surgery

## 2011-10-25 DIAGNOSIS — M545 Low back pain, unspecified: Secondary | ICD-10-CM

## 2011-10-25 DIAGNOSIS — M79609 Pain in unspecified limb: Secondary | ICD-10-CM | POA: Diagnosis not present

## 2011-10-25 DIAGNOSIS — B351 Tinea unguium: Secondary | ICD-10-CM | POA: Diagnosis not present

## 2011-10-31 ENCOUNTER — Ambulatory Visit
Admission: RE | Admit: 2011-10-31 | Discharge: 2011-10-31 | Disposition: A | Discharge status: Home / Self Care | Payer: Medicare Other | Source: Ambulatory Visit | Attending: Neurological Surgery | Admitting: Neurological Surgery

## 2011-10-31 DIAGNOSIS — M545 Low back pain, unspecified: Secondary | ICD-10-CM

## 2011-10-31 DIAGNOSIS — M47817 Spondylosis without myelopathy or radiculopathy, lumbosacral region: Secondary | ICD-10-CM | POA: Diagnosis not present

## 2011-10-31 DIAGNOSIS — M5126 Other intervertebral disc displacement, lumbar region: Secondary | ICD-10-CM | POA: Diagnosis not present

## 2011-10-31 MED ORDER — GADOBENATE DIMEGLUMINE 529 MG/ML IV SOLN
12.0000 mL | Freq: Once | INTRAVENOUS | Status: AC | PRN
  Administered 2011-10-31: 12 mL via INTRAVENOUS

## 2011-11-01 ENCOUNTER — Other Ambulatory Visit: Payer: Medicare Other

## 2011-11-02 DIAGNOSIS — I8310 Varicose veins of unspecified lower extremity with inflammation: Secondary | ICD-10-CM | POA: Diagnosis not present

## 2011-11-24 DIAGNOSIS — I8310 Varicose veins of unspecified lower extremity with inflammation: Secondary | ICD-10-CM | POA: Diagnosis not present

## 2011-12-05 ENCOUNTER — Telehealth: Payer: Self-pay | Admitting: *Deleted

## 2011-12-05 MED ORDER — FLUCONAZOLE 150 MG PO TABS: 150.0000 mg | ORAL_TABLET | Freq: Every day | ORAL | Status: AC

## 2011-12-05 NOTE — Addendum Note (Signed)
Addended by: Aura Camps on: 12/05/2011 02:14 PM   Modules accepted: Orders

## 2011-12-05 NOTE — Telephone Encounter (Signed)
Pt informed, rx sent 

## 2011-12-05 NOTE — Telephone Encounter (Signed)
okay

## 2011-12-05 NOTE — Telephone Encounter (Signed)
Pt is calling requesting rx for diflucan 3 pills, she has been taken antibody for respiratory infection. Pt said she was given 3 pills daily in past before and it worked good. Please advise

## 2011-12-09 DIAGNOSIS — I8310 Varicose veins of unspecified lower extremity with inflammation: Secondary | ICD-10-CM | POA: Diagnosis not present

## 2011-12-10 DIAGNOSIS — R05 Cough: Secondary | ICD-10-CM | POA: Diagnosis not present

## 2011-12-10 DIAGNOSIS — R059 Cough, unspecified: Secondary | ICD-10-CM | POA: Diagnosis not present

## 2011-12-20 DIAGNOSIS — I8310 Varicose veins of unspecified lower extremity with inflammation: Secondary | ICD-10-CM | POA: Diagnosis not present

## 2011-12-22 DIAGNOSIS — I8310 Varicose veins of unspecified lower extremity with inflammation: Secondary | ICD-10-CM | POA: Diagnosis not present

## 2011-12-22 DIAGNOSIS — M79609 Pain in unspecified limb: Secondary | ICD-10-CM | POA: Diagnosis not present

## 2011-12-27 DIAGNOSIS — R490 Dysphonia: Secondary | ICD-10-CM | POA: Diagnosis not present

## 2012-01-02 DIAGNOSIS — M79609 Pain in unspecified limb: Secondary | ICD-10-CM | POA: Diagnosis not present

## 2012-01-03 DIAGNOSIS — M79609 Pain in unspecified limb: Secondary | ICD-10-CM | POA: Diagnosis not present

## 2012-01-03 DIAGNOSIS — B351 Tinea unguium: Secondary | ICD-10-CM | POA: Diagnosis not present

## 2012-01-05 DIAGNOSIS — H905 Unspecified sensorineural hearing loss: Secondary | ICD-10-CM | POA: Diagnosis not present

## 2012-01-05 DIAGNOSIS — H6120 Impacted cerumen, unspecified ear: Secondary | ICD-10-CM | POA: Diagnosis not present

## 2012-01-05 DIAGNOSIS — H903 Sensorineural hearing loss, bilateral: Secondary | ICD-10-CM | POA: Diagnosis not present

## 2012-01-10 DIAGNOSIS — I8310 Varicose veins of unspecified lower extremity with inflammation: Secondary | ICD-10-CM | POA: Diagnosis not present

## 2012-01-12 DIAGNOSIS — M79609 Pain in unspecified limb: Secondary | ICD-10-CM | POA: Diagnosis not present

## 2012-01-12 DIAGNOSIS — I8310 Varicose veins of unspecified lower extremity with inflammation: Secondary | ICD-10-CM | POA: Diagnosis not present

## 2012-01-26 ENCOUNTER — Institutional Professional Consult (permissible substitution): Payer: Medicare Other | Admitting: Critical Care Medicine

## 2012-01-27 DIAGNOSIS — I8310 Varicose veins of unspecified lower extremity with inflammation: Secondary | ICD-10-CM | POA: Diagnosis not present

## 2012-01-31 DIAGNOSIS — M79609 Pain in unspecified limb: Secondary | ICD-10-CM | POA: Diagnosis not present

## 2012-01-31 DIAGNOSIS — I8310 Varicose veins of unspecified lower extremity with inflammation: Secondary | ICD-10-CM | POA: Diagnosis not present

## 2012-02-08 DIAGNOSIS — Z23 Encounter for immunization: Secondary | ICD-10-CM | POA: Diagnosis not present

## 2012-02-13 DIAGNOSIS — I10 Essential (primary) hypertension: Secondary | ICD-10-CM | POA: Diagnosis not present

## 2012-02-13 DIAGNOSIS — I2510 Atherosclerotic heart disease of native coronary artery without angina pectoris: Secondary | ICD-10-CM | POA: Diagnosis not present

## 2012-02-13 DIAGNOSIS — K219 Gastro-esophageal reflux disease without esophagitis: Secondary | ICD-10-CM | POA: Diagnosis not present

## 2012-02-13 DIAGNOSIS — F411 Generalized anxiety disorder: Secondary | ICD-10-CM | POA: Diagnosis not present

## 2012-02-13 DIAGNOSIS — E7800 Pure hypercholesterolemia, unspecified: Secondary | ICD-10-CM | POA: Diagnosis not present

## 2012-02-14 DIAGNOSIS — M79609 Pain in unspecified limb: Secondary | ICD-10-CM | POA: Diagnosis not present

## 2012-02-14 DIAGNOSIS — I8310 Varicose veins of unspecified lower extremity with inflammation: Secondary | ICD-10-CM | POA: Diagnosis not present

## 2012-02-23 DIAGNOSIS — R82998 Other abnormal findings in urine: Secondary | ICD-10-CM | POA: Diagnosis not present

## 2012-02-23 DIAGNOSIS — M810 Age-related osteoporosis without current pathological fracture: Secondary | ICD-10-CM | POA: Diagnosis not present

## 2012-02-23 DIAGNOSIS — Z0000 Encounter for general adult medical examination without abnormal findings: Secondary | ICD-10-CM | POA: Diagnosis not present

## 2012-02-23 DIAGNOSIS — L821 Other seborrheic keratosis: Secondary | ICD-10-CM | POA: Diagnosis not present

## 2012-02-23 DIAGNOSIS — E039 Hypothyroidism, unspecified: Secondary | ICD-10-CM | POA: Diagnosis not present

## 2012-02-23 DIAGNOSIS — I10 Essential (primary) hypertension: Secondary | ICD-10-CM | POA: Diagnosis not present

## 2012-02-23 DIAGNOSIS — L570 Actinic keratosis: Secondary | ICD-10-CM | POA: Diagnosis not present

## 2012-02-28 DIAGNOSIS — M79609 Pain in unspecified limb: Secondary | ICD-10-CM | POA: Diagnosis not present

## 2012-02-28 DIAGNOSIS — I8310 Varicose veins of unspecified lower extremity with inflammation: Secondary | ICD-10-CM | POA: Diagnosis not present

## 2012-03-07 DIAGNOSIS — I2510 Atherosclerotic heart disease of native coronary artery without angina pectoris: Secondary | ICD-10-CM | POA: Diagnosis not present

## 2012-03-07 DIAGNOSIS — I10 Essential (primary) hypertension: Secondary | ICD-10-CM | POA: Diagnosis not present

## 2012-03-07 DIAGNOSIS — E785 Hyperlipidemia, unspecified: Secondary | ICD-10-CM | POA: Diagnosis not present

## 2012-03-07 DIAGNOSIS — Z0000 Encounter for general adult medical examination without abnormal findings: Secondary | ICD-10-CM | POA: Diagnosis not present

## 2012-03-08 DIAGNOSIS — Z1212 Encounter for screening for malignant neoplasm of rectum: Secondary | ICD-10-CM | POA: Diagnosis not present

## 2012-03-13 DIAGNOSIS — M79609 Pain in unspecified limb: Secondary | ICD-10-CM | POA: Diagnosis not present

## 2012-03-13 DIAGNOSIS — I8310 Varicose veins of unspecified lower extremity with inflammation: Secondary | ICD-10-CM | POA: Diagnosis not present

## 2012-03-13 DIAGNOSIS — M7981 Nontraumatic hematoma of soft tissue: Secondary | ICD-10-CM | POA: Diagnosis not present

## 2012-03-26 DIAGNOSIS — S51809A Unspecified open wound of unspecified forearm, initial encounter: Secondary | ICD-10-CM | POA: Diagnosis not present

## 2012-03-27 DIAGNOSIS — I8310 Varicose veins of unspecified lower extremity with inflammation: Secondary | ICD-10-CM | POA: Diagnosis not present

## 2012-03-27 DIAGNOSIS — M79609 Pain in unspecified limb: Secondary | ICD-10-CM | POA: Diagnosis not present

## 2012-03-31 DIAGNOSIS — M545 Low back pain, unspecified: Secondary | ICD-10-CM | POA: Diagnosis not present

## 2012-03-31 DIAGNOSIS — M533 Sacrococcygeal disorders, not elsewhere classified: Secondary | ICD-10-CM | POA: Diagnosis not present

## 2012-03-31 DIAGNOSIS — M47817 Spondylosis without myelopathy or radiculopathy, lumbosacral region: Secondary | ICD-10-CM | POA: Diagnosis not present

## 2012-03-31 DIAGNOSIS — Z981 Arthrodesis status: Secondary | ICD-10-CM | POA: Diagnosis not present

## 2012-04-04 DIAGNOSIS — M6530 Trigger finger, unspecified finger: Secondary | ICD-10-CM | POA: Diagnosis not present

## 2012-04-10 DIAGNOSIS — M79609 Pain in unspecified limb: Secondary | ICD-10-CM | POA: Diagnosis not present

## 2012-04-10 DIAGNOSIS — I8310 Varicose veins of unspecified lower extremity with inflammation: Secondary | ICD-10-CM | POA: Diagnosis not present

## 2012-04-10 DIAGNOSIS — M7981 Nontraumatic hematoma of soft tissue: Secondary | ICD-10-CM | POA: Diagnosis not present

## 2012-04-17 DIAGNOSIS — IMO0002 Reserved for concepts with insufficient information to code with codable children: Secondary | ICD-10-CM | POA: Diagnosis not present

## 2012-04-17 DIAGNOSIS — M1710 Unilateral primary osteoarthritis, unspecified knee: Secondary | ICD-10-CM | POA: Diagnosis not present

## 2012-04-25 DIAGNOSIS — M6530 Trigger finger, unspecified finger: Secondary | ICD-10-CM | POA: Diagnosis not present

## 2012-05-29 DIAGNOSIS — B351 Tinea unguium: Secondary | ICD-10-CM | POA: Diagnosis not present

## 2012-06-11 DIAGNOSIS — B9789 Other viral agents as the cause of diseases classified elsewhere: Secondary | ICD-10-CM | POA: Diagnosis not present

## 2012-06-11 DIAGNOSIS — J209 Acute bronchitis, unspecified: Secondary | ICD-10-CM | POA: Diagnosis not present

## 2012-06-11 DIAGNOSIS — J029 Acute pharyngitis, unspecified: Secondary | ICD-10-CM | POA: Diagnosis not present

## 2012-06-11 DIAGNOSIS — R509 Fever, unspecified: Secondary | ICD-10-CM | POA: Diagnosis not present

## 2012-06-27 DIAGNOSIS — H52209 Unspecified astigmatism, unspecified eye: Secondary | ICD-10-CM | POA: Diagnosis not present

## 2012-06-27 DIAGNOSIS — H2510 Age-related nuclear cataract, unspecified eye: Secondary | ICD-10-CM | POA: Diagnosis not present

## 2012-07-02 DIAGNOSIS — E039 Hypothyroidism, unspecified: Secondary | ICD-10-CM | POA: Diagnosis not present

## 2012-07-02 DIAGNOSIS — I10 Essential (primary) hypertension: Secondary | ICD-10-CM | POA: Diagnosis not present

## 2012-07-04 DIAGNOSIS — I8310 Varicose veins of unspecified lower extremity with inflammation: Secondary | ICD-10-CM | POA: Diagnosis not present

## 2012-07-09 DIAGNOSIS — M6530 Trigger finger, unspecified finger: Secondary | ICD-10-CM | POA: Diagnosis not present

## 2012-07-11 DIAGNOSIS — M47817 Spondylosis without myelopathy or radiculopathy, lumbosacral region: Secondary | ICD-10-CM | POA: Diagnosis not present

## 2012-07-20 DIAGNOSIS — I8310 Varicose veins of unspecified lower extremity with inflammation: Secondary | ICD-10-CM | POA: Diagnosis not present

## 2012-07-27 DIAGNOSIS — M47817 Spondylosis without myelopathy or radiculopathy, lumbosacral region: Secondary | ICD-10-CM | POA: Diagnosis not present

## 2012-07-27 DIAGNOSIS — Z981 Arthrodesis status: Secondary | ICD-10-CM | POA: Diagnosis not present

## 2012-08-01 DIAGNOSIS — I8310 Varicose veins of unspecified lower extremity with inflammation: Secondary | ICD-10-CM | POA: Diagnosis not present

## 2012-08-01 DIAGNOSIS — M79609 Pain in unspecified limb: Secondary | ICD-10-CM | POA: Diagnosis not present

## 2012-08-01 DIAGNOSIS — M7981 Nontraumatic hematoma of soft tissue: Secondary | ICD-10-CM | POA: Diagnosis not present

## 2012-08-09 DIAGNOSIS — B351 Tinea unguium: Secondary | ICD-10-CM | POA: Diagnosis not present

## 2012-08-09 DIAGNOSIS — M79609 Pain in unspecified limb: Secondary | ICD-10-CM | POA: Diagnosis not present

## 2012-08-15 DIAGNOSIS — M79609 Pain in unspecified limb: Secondary | ICD-10-CM | POA: Diagnosis not present

## 2012-08-15 DIAGNOSIS — I8310 Varicose veins of unspecified lower extremity with inflammation: Secondary | ICD-10-CM | POA: Diagnosis not present

## 2012-08-23 ENCOUNTER — Other Ambulatory Visit: Payer: Self-pay | Admitting: Dermatology

## 2012-08-23 DIAGNOSIS — L821 Other seborrheic keratosis: Secondary | ICD-10-CM | POA: Diagnosis not present

## 2012-08-23 DIAGNOSIS — Z124 Encounter for screening for malignant neoplasm of cervix: Secondary | ICD-10-CM | POA: Diagnosis not present

## 2012-08-23 DIAGNOSIS — Z01419 Encounter for gynecological examination (general) (routine) without abnormal findings: Secondary | ICD-10-CM | POA: Diagnosis not present

## 2012-08-23 DIAGNOSIS — Z1231 Encounter for screening mammogram for malignant neoplasm of breast: Secondary | ICD-10-CM | POA: Diagnosis not present

## 2012-08-23 DIAGNOSIS — L570 Actinic keratosis: Secondary | ICD-10-CM | POA: Diagnosis not present

## 2012-08-23 DIAGNOSIS — D485 Neoplasm of uncertain behavior of skin: Secondary | ICD-10-CM | POA: Diagnosis not present

## 2012-08-23 DIAGNOSIS — L988 Other specified disorders of the skin and subcutaneous tissue: Secondary | ICD-10-CM | POA: Diagnosis not present

## 2012-09-05 DIAGNOSIS — K219 Gastro-esophageal reflux disease without esophagitis: Secondary | ICD-10-CM | POA: Diagnosis not present

## 2012-09-05 DIAGNOSIS — I2510 Atherosclerotic heart disease of native coronary artery without angina pectoris: Secondary | ICD-10-CM | POA: Diagnosis not present

## 2012-09-05 DIAGNOSIS — I10 Essential (primary) hypertension: Secondary | ICD-10-CM | POA: Diagnosis not present

## 2012-09-05 DIAGNOSIS — E785 Hyperlipidemia, unspecified: Secondary | ICD-10-CM | POA: Diagnosis not present

## 2012-09-05 DIAGNOSIS — IMO0002 Reserved for concepts with insufficient information to code with codable children: Secondary | ICD-10-CM | POA: Diagnosis not present

## 2012-10-29 DIAGNOSIS — L851 Acquired keratosis [keratoderma] palmaris et plantaris: Secondary | ICD-10-CM | POA: Diagnosis not present

## 2012-10-29 DIAGNOSIS — G609 Hereditary and idiopathic neuropathy, unspecified: Secondary | ICD-10-CM | POA: Diagnosis not present

## 2012-10-30 DIAGNOSIS — B351 Tinea unguium: Secondary | ICD-10-CM | POA: Diagnosis not present

## 2012-10-30 DIAGNOSIS — M79609 Pain in unspecified limb: Secondary | ICD-10-CM | POA: Diagnosis not present

## 2012-11-02 ENCOUNTER — Other Ambulatory Visit: Payer: Self-pay | Admitting: Neurological Surgery

## 2012-11-02 DIAGNOSIS — M47816 Spondylosis without myelopathy or radiculopathy, lumbar region: Secondary | ICD-10-CM

## 2012-11-12 ENCOUNTER — Ambulatory Visit
Admission: RE | Admit: 2012-11-12 | Discharge: 2012-11-12 | Disposition: A | Discharge status: Home / Self Care | Payer: Medicare Other | Source: Ambulatory Visit | Attending: Neurological Surgery | Admitting: Neurological Surgery

## 2012-11-12 DIAGNOSIS — M47816 Spondylosis without myelopathy or radiculopathy, lumbar region: Secondary | ICD-10-CM

## 2012-11-12 DIAGNOSIS — M47817 Spondylosis without myelopathy or radiculopathy, lumbosacral region: Secondary | ICD-10-CM | POA: Diagnosis not present

## 2012-11-12 DIAGNOSIS — M48061 Spinal stenosis, lumbar region without neurogenic claudication: Secondary | ICD-10-CM | POA: Diagnosis not present

## 2012-11-12 MED ORDER — GADOBENATE DIMEGLUMINE 529 MG/ML IV SOLN
6.0000 mL | Freq: Once | INTRAVENOUS | Status: AC | PRN
  Administered 2012-11-12: 6 mL via INTRAVENOUS

## 2012-11-14 DIAGNOSIS — IMO0002 Reserved for concepts with insufficient information to code with codable children: Secondary | ICD-10-CM | POA: Diagnosis not present

## 2012-11-15 DIAGNOSIS — Z96659 Presence of unspecified artificial knee joint: Secondary | ICD-10-CM | POA: Diagnosis not present

## 2012-11-15 DIAGNOSIS — M76899 Other specified enthesopathies of unspecified lower limb, excluding foot: Secondary | ICD-10-CM | POA: Diagnosis not present

## 2012-11-22 DIAGNOSIS — M1710 Unilateral primary osteoarthritis, unspecified knee: Secondary | ICD-10-CM | POA: Diagnosis not present

## 2012-11-26 DIAGNOSIS — M1710 Unilateral primary osteoarthritis, unspecified knee: Secondary | ICD-10-CM | POA: Diagnosis not present

## 2012-11-27 DIAGNOSIS — I8310 Varicose veins of unspecified lower extremity with inflammation: Secondary | ICD-10-CM | POA: Diagnosis not present

## 2012-11-28 DIAGNOSIS — M1710 Unilateral primary osteoarthritis, unspecified knee: Secondary | ICD-10-CM | POA: Diagnosis not present

## 2012-12-03 DIAGNOSIS — M1710 Unilateral primary osteoarthritis, unspecified knee: Secondary | ICD-10-CM | POA: Diagnosis not present

## 2012-12-07 DIAGNOSIS — M1710 Unilateral primary osteoarthritis, unspecified knee: Secondary | ICD-10-CM | POA: Diagnosis not present

## 2012-12-10 DIAGNOSIS — M1710 Unilateral primary osteoarthritis, unspecified knee: Secondary | ICD-10-CM | POA: Diagnosis not present

## 2012-12-11 DIAGNOSIS — G619 Inflammatory polyneuropathy, unspecified: Secondary | ICD-10-CM | POA: Diagnosis not present

## 2012-12-11 DIAGNOSIS — G622 Polyneuropathy due to other toxic agents: Secondary | ICD-10-CM | POA: Diagnosis not present

## 2012-12-11 DIAGNOSIS — IMO0002 Reserved for concepts with insufficient information to code with codable children: Secondary | ICD-10-CM | POA: Diagnosis not present

## 2012-12-12 ENCOUNTER — Other Ambulatory Visit: Payer: Self-pay | Admitting: Dermatology

## 2012-12-12 DIAGNOSIS — C44721 Squamous cell carcinoma of skin of unspecified lower limb, including hip: Secondary | ICD-10-CM | POA: Diagnosis not present

## 2012-12-12 DIAGNOSIS — Z85828 Personal history of other malignant neoplasm of skin: Secondary | ICD-10-CM | POA: Diagnosis not present

## 2012-12-12 DIAGNOSIS — D485 Neoplasm of uncertain behavior of skin: Secondary | ICD-10-CM | POA: Diagnosis not present

## 2012-12-12 DIAGNOSIS — L570 Actinic keratosis: Secondary | ICD-10-CM | POA: Diagnosis not present

## 2012-12-17 DIAGNOSIS — M6530 Trigger finger, unspecified finger: Secondary | ICD-10-CM | POA: Diagnosis not present

## 2012-12-24 DIAGNOSIS — M1710 Unilateral primary osteoarthritis, unspecified knee: Secondary | ICD-10-CM | POA: Diagnosis not present

## 2012-12-25 DIAGNOSIS — M76899 Other specified enthesopathies of unspecified lower limb, excluding foot: Secondary | ICD-10-CM | POA: Diagnosis not present

## 2012-12-25 DIAGNOSIS — Z96659 Presence of unspecified artificial knee joint: Secondary | ICD-10-CM | POA: Diagnosis not present

## 2012-12-26 DIAGNOSIS — I872 Venous insufficiency (chronic) (peripheral): Secondary | ICD-10-CM | POA: Diagnosis not present

## 2012-12-28 DIAGNOSIS — M79609 Pain in unspecified limb: Secondary | ICD-10-CM | POA: Diagnosis not present

## 2013-01-05 ENCOUNTER — Encounter (HOSPITAL_BASED_OUTPATIENT_CLINIC_OR_DEPARTMENT_OTHER): Payer: Self-pay | Admitting: Student

## 2013-01-05 ENCOUNTER — Emergency Department (HOSPITAL_BASED_OUTPATIENT_CLINIC_OR_DEPARTMENT_OTHER)
Admission: EM | Admit: 2013-01-05 | Discharge: 2013-01-05 | Discharge status: Against Medical Advice | Payer: Medicare Other | Attending: Emergency Medicine | Admitting: Emergency Medicine

## 2013-01-05 DIAGNOSIS — S298XXA Other specified injuries of thorax, initial encounter: Secondary | ICD-10-CM | POA: Insufficient documentation

## 2013-01-05 DIAGNOSIS — IMO0002 Reserved for concepts with insufficient information to code with codable children: Secondary | ICD-10-CM | POA: Insufficient documentation

## 2013-01-05 DIAGNOSIS — I10 Essential (primary) hypertension: Secondary | ICD-10-CM | POA: Insufficient documentation

## 2013-01-05 DIAGNOSIS — Z981 Arthrodesis status: Secondary | ICD-10-CM | POA: Diagnosis not present

## 2013-01-05 DIAGNOSIS — Y9389 Activity, other specified: Secondary | ICD-10-CM | POA: Insufficient documentation

## 2013-01-05 DIAGNOSIS — Z96659 Presence of unspecified artificial knee joint: Secondary | ICD-10-CM | POA: Diagnosis not present

## 2013-01-05 DIAGNOSIS — W11XXXA Fall on and from ladder, initial encounter: Secondary | ICD-10-CM | POA: Insufficient documentation

## 2013-01-05 DIAGNOSIS — Y929 Unspecified place or not applicable: Secondary | ICD-10-CM | POA: Insufficient documentation

## 2013-01-05 HISTORY — DX: Hypothyroidism, unspecified: E03.9

## 2013-01-05 NOTE — ED Notes (Signed)
Pt in with c/o fall off ladder on to buttocks and reports pain to tailbone, left knee and bilateral rib cage pain. Reports falling off 8 ft ladder and hit shrubbery. No LOC, reports concerns over prior knee replacement and spinal fusion. Gait steady and ambulates well.

## 2013-01-05 NOTE — ED Notes (Signed)
Pt told registration they were leaving and did not wait to sign ama papers.

## 2013-01-07 DIAGNOSIS — M545 Low back pain, unspecified: Secondary | ICD-10-CM | POA: Diagnosis not present

## 2013-01-07 DIAGNOSIS — M6530 Trigger finger, unspecified finger: Secondary | ICD-10-CM | POA: Diagnosis not present

## 2013-01-11 DIAGNOSIS — IMO0002 Reserved for concepts with insufficient information to code with codable children: Secondary | ICD-10-CM | POA: Diagnosis not present

## 2013-01-11 DIAGNOSIS — M47817 Spondylosis without myelopathy or radiculopathy, lumbosacral region: Secondary | ICD-10-CM | POA: Diagnosis not present

## 2013-01-14 DIAGNOSIS — M6530 Trigger finger, unspecified finger: Secondary | ICD-10-CM | POA: Diagnosis not present

## 2013-01-16 DIAGNOSIS — N3941 Urge incontinence: Secondary | ICD-10-CM | POA: Diagnosis not present

## 2013-01-16 DIAGNOSIS — R39198 Other difficulties with micturition: Secondary | ICD-10-CM | POA: Diagnosis not present

## 2013-01-17 DIAGNOSIS — M79609 Pain in unspecified limb: Secondary | ICD-10-CM | POA: Diagnosis not present

## 2013-01-17 DIAGNOSIS — B351 Tinea unguium: Secondary | ICD-10-CM | POA: Diagnosis not present

## 2013-01-23 DIAGNOSIS — H903 Sensorineural hearing loss, bilateral: Secondary | ICD-10-CM | POA: Diagnosis not present

## 2013-01-23 DIAGNOSIS — H6120 Impacted cerumen, unspecified ear: Secondary | ICD-10-CM | POA: Diagnosis not present

## 2013-01-23 DIAGNOSIS — H905 Unspecified sensorineural hearing loss: Secondary | ICD-10-CM | POA: Diagnosis not present

## 2013-02-13 DIAGNOSIS — M6530 Trigger finger, unspecified finger: Secondary | ICD-10-CM | POA: Diagnosis not present

## 2013-02-20 DIAGNOSIS — E7800 Pure hypercholesterolemia, unspecified: Secondary | ICD-10-CM | POA: Diagnosis not present

## 2013-02-20 DIAGNOSIS — I10 Essential (primary) hypertension: Secondary | ICD-10-CM | POA: Diagnosis not present

## 2013-02-20 DIAGNOSIS — I2510 Atherosclerotic heart disease of native coronary artery without angina pectoris: Secondary | ICD-10-CM | POA: Diagnosis not present

## 2013-02-28 DIAGNOSIS — Z23 Encounter for immunization: Secondary | ICD-10-CM | POA: Diagnosis not present

## 2013-03-04 DIAGNOSIS — M810 Age-related osteoporosis without current pathological fracture: Secondary | ICD-10-CM | POA: Diagnosis not present

## 2013-03-04 DIAGNOSIS — E785 Hyperlipidemia, unspecified: Secondary | ICD-10-CM | POA: Diagnosis not present

## 2013-03-04 DIAGNOSIS — I10 Essential (primary) hypertension: Secondary | ICD-10-CM | POA: Diagnosis not present

## 2013-03-04 DIAGNOSIS — E039 Hypothyroidism, unspecified: Secondary | ICD-10-CM | POA: Diagnosis not present

## 2013-03-13 DIAGNOSIS — I2510 Atherosclerotic heart disease of native coronary artery without angina pectoris: Secondary | ICD-10-CM | POA: Diagnosis not present

## 2013-03-13 DIAGNOSIS — IMO0002 Reserved for concepts with insufficient information to code with codable children: Secondary | ICD-10-CM | POA: Diagnosis not present

## 2013-03-13 DIAGNOSIS — Z6826 Body mass index (BMI) 26.0-26.9, adult: Secondary | ICD-10-CM | POA: Diagnosis not present

## 2013-03-13 DIAGNOSIS — K219 Gastro-esophageal reflux disease without esophagitis: Secondary | ICD-10-CM | POA: Diagnosis not present

## 2013-03-13 DIAGNOSIS — E039 Hypothyroidism, unspecified: Secondary | ICD-10-CM | POA: Diagnosis not present

## 2013-03-13 DIAGNOSIS — I10 Essential (primary) hypertension: Secondary | ICD-10-CM | POA: Diagnosis not present

## 2013-03-13 DIAGNOSIS — Z0000 Encounter for general adult medical examination without abnormal findings: Secondary | ICD-10-CM | POA: Diagnosis not present

## 2013-03-13 DIAGNOSIS — E785 Hyperlipidemia, unspecified: Secondary | ICD-10-CM | POA: Diagnosis not present

## 2013-03-20 DIAGNOSIS — Z1212 Encounter for screening for malignant neoplasm of rectum: Secondary | ICD-10-CM | POA: Diagnosis not present

## 2013-03-28 DIAGNOSIS — M79609 Pain in unspecified limb: Secondary | ICD-10-CM | POA: Diagnosis not present

## 2013-03-28 DIAGNOSIS — B351 Tinea unguium: Secondary | ICD-10-CM | POA: Diagnosis not present

## 2013-04-03 DIAGNOSIS — L570 Actinic keratosis: Secondary | ICD-10-CM | POA: Diagnosis not present

## 2013-04-03 DIAGNOSIS — Z85828 Personal history of other malignant neoplasm of skin: Secondary | ICD-10-CM | POA: Diagnosis not present

## 2013-04-03 DIAGNOSIS — L821 Other seborrheic keratosis: Secondary | ICD-10-CM | POA: Diagnosis not present

## 2013-06-11 DIAGNOSIS — M79609 Pain in unspecified limb: Secondary | ICD-10-CM | POA: Diagnosis not present

## 2013-06-11 DIAGNOSIS — B351 Tinea unguium: Secondary | ICD-10-CM | POA: Diagnosis not present

## 2013-07-01 DIAGNOSIS — H25019 Cortical age-related cataract, unspecified eye: Secondary | ICD-10-CM | POA: Diagnosis not present

## 2013-07-01 DIAGNOSIS — H52209 Unspecified astigmatism, unspecified eye: Secondary | ICD-10-CM | POA: Diagnosis not present

## 2013-07-01 DIAGNOSIS — H2510 Age-related nuclear cataract, unspecified eye: Secondary | ICD-10-CM | POA: Diagnosis not present

## 2013-07-24 DIAGNOSIS — M6530 Trigger finger, unspecified finger: Secondary | ICD-10-CM | POA: Diagnosis not present

## 2013-07-24 DIAGNOSIS — Z4789 Encounter for other orthopedic aftercare: Secondary | ICD-10-CM | POA: Diagnosis not present

## 2013-07-31 DIAGNOSIS — M6530 Trigger finger, unspecified finger: Secondary | ICD-10-CM | POA: Diagnosis not present

## 2013-08-20 DIAGNOSIS — B351 Tinea unguium: Secondary | ICD-10-CM | POA: Diagnosis not present

## 2013-08-20 DIAGNOSIS — M79609 Pain in unspecified limb: Secondary | ICD-10-CM | POA: Diagnosis not present

## 2013-08-20 DIAGNOSIS — H04229 Epiphora due to insufficient drainage, unspecified lacrimal gland: Secondary | ICD-10-CM | POA: Diagnosis not present

## 2013-08-20 DIAGNOSIS — H2510 Age-related nuclear cataract, unspecified eye: Secondary | ICD-10-CM | POA: Diagnosis not present

## 2013-08-26 DIAGNOSIS — Z1231 Encounter for screening mammogram for malignant neoplasm of breast: Secondary | ICD-10-CM | POA: Diagnosis not present

## 2013-09-16 DIAGNOSIS — Z6826 Body mass index (BMI) 26.0-26.9, adult: Secondary | ICD-10-CM | POA: Diagnosis not present

## 2013-09-16 DIAGNOSIS — I2510 Atherosclerotic heart disease of native coronary artery without angina pectoris: Secondary | ICD-10-CM | POA: Diagnosis not present

## 2013-09-16 DIAGNOSIS — E039 Hypothyroidism, unspecified: Secondary | ICD-10-CM | POA: Diagnosis not present

## 2013-09-16 DIAGNOSIS — IMO0002 Reserved for concepts with insufficient information to code with codable children: Secondary | ICD-10-CM | POA: Diagnosis not present

## 2013-09-16 DIAGNOSIS — Z1331 Encounter for screening for depression: Secondary | ICD-10-CM | POA: Diagnosis not present

## 2013-09-16 DIAGNOSIS — K219 Gastro-esophageal reflux disease without esophagitis: Secondary | ICD-10-CM | POA: Diagnosis not present

## 2013-09-16 DIAGNOSIS — E785 Hyperlipidemia, unspecified: Secondary | ICD-10-CM | POA: Diagnosis not present

## 2013-09-16 DIAGNOSIS — I10 Essential (primary) hypertension: Secondary | ICD-10-CM | POA: Diagnosis not present

## 2013-09-16 DIAGNOSIS — F341 Dysthymic disorder: Secondary | ICD-10-CM | POA: Diagnosis not present

## 2013-09-23 DIAGNOSIS — M722 Plantar fascial fibromatosis: Secondary | ICD-10-CM | POA: Diagnosis not present

## 2013-09-24 DIAGNOSIS — M810 Age-related osteoporosis without current pathological fracture: Secondary | ICD-10-CM | POA: Diagnosis not present

## 2013-09-24 DIAGNOSIS — I8310 Varicose veins of unspecified lower extremity with inflammation: Secondary | ICD-10-CM | POA: Diagnosis not present

## 2013-09-28 DIAGNOSIS — M722 Plantar fascial fibromatosis: Secondary | ICD-10-CM | POA: Diagnosis not present

## 2013-10-01 DIAGNOSIS — L820 Inflamed seborrheic keratosis: Secondary | ICD-10-CM | POA: Diagnosis not present

## 2013-10-01 DIAGNOSIS — L821 Other seborrheic keratosis: Secondary | ICD-10-CM | POA: Diagnosis not present

## 2013-10-01 DIAGNOSIS — L570 Actinic keratosis: Secondary | ICD-10-CM | POA: Diagnosis not present

## 2013-10-01 DIAGNOSIS — Z85828 Personal history of other malignant neoplasm of skin: Secondary | ICD-10-CM | POA: Diagnosis not present

## 2013-10-01 DIAGNOSIS — M722 Plantar fascial fibromatosis: Secondary | ICD-10-CM | POA: Diagnosis not present

## 2013-10-02 DIAGNOSIS — I872 Venous insufficiency (chronic) (peripheral): Secondary | ICD-10-CM | POA: Diagnosis not present

## 2013-10-03 DIAGNOSIS — Z471 Aftercare following joint replacement surgery: Secondary | ICD-10-CM | POA: Diagnosis not present

## 2013-10-03 DIAGNOSIS — Z96659 Presence of unspecified artificial knee joint: Secondary | ICD-10-CM | POA: Diagnosis not present

## 2013-10-03 DIAGNOSIS — Z01419 Encounter for gynecological examination (general) (routine) without abnormal findings: Secondary | ICD-10-CM | POA: Diagnosis not present

## 2013-10-03 DIAGNOSIS — Z124 Encounter for screening for malignant neoplasm of cervix: Secondary | ICD-10-CM | POA: Diagnosis not present

## 2013-10-04 ENCOUNTER — Other Ambulatory Visit (HOSPITAL_COMMUNITY): Payer: Self-pay | Admitting: Orthopedic Surgery

## 2013-10-04 DIAGNOSIS — M25562 Pain in left knee: Secondary | ICD-10-CM

## 2013-10-07 DIAGNOSIS — M129 Arthropathy, unspecified: Secondary | ICD-10-CM | POA: Diagnosis not present

## 2013-10-09 DIAGNOSIS — I8310 Varicose veins of unspecified lower extremity with inflammation: Secondary | ICD-10-CM | POA: Diagnosis not present

## 2013-10-09 DIAGNOSIS — M79609 Pain in unspecified limb: Secondary | ICD-10-CM | POA: Diagnosis not present

## 2013-10-18 ENCOUNTER — Encounter (HOSPITAL_COMMUNITY)
Admission: RE | Admit: 2013-10-18 | Discharge: 2013-10-18 | Disposition: A | Discharge status: Home / Self Care | Payer: Medicare Other | Source: Ambulatory Visit | Attending: Orthopedic Surgery | Admitting: Orthopedic Surgery

## 2013-10-18 ENCOUNTER — Encounter (HOSPITAL_COMMUNITY)
Admission: RE | Admit: 2013-10-18 | Discharge: 2013-10-18 | Disposition: A | Discharge status: Home / Self Care | Payer: Medicare Other | Source: Ambulatory Visit | Attending: Diagnostic Radiology | Admitting: Diagnostic Radiology

## 2013-10-18 DIAGNOSIS — T8489XA Other specified complication of internal orthopedic prosthetic devices, implants and grafts, initial encounter: Secondary | ICD-10-CM | POA: Insufficient documentation

## 2013-10-18 DIAGNOSIS — Y929 Unspecified place or not applicable: Secondary | ICD-10-CM | POA: Diagnosis not present

## 2013-10-18 DIAGNOSIS — Y831 Surgical operation with implant of artificial internal device as the cause of abnormal reaction of the patient, or of later complication, without mention of misadventure at the time of the procedure: Secondary | ICD-10-CM | POA: Insufficient documentation

## 2013-10-18 DIAGNOSIS — M25562 Pain in left knee: Secondary | ICD-10-CM

## 2013-10-18 DIAGNOSIS — M25569 Pain in unspecified knee: Secondary | ICD-10-CM | POA: Diagnosis not present

## 2013-10-18 DIAGNOSIS — Z96659 Presence of unspecified artificial knee joint: Secondary | ICD-10-CM | POA: Diagnosis not present

## 2013-10-18 MED ORDER — TECHNETIUM TC 99M MEDRONATE IV KIT
25.0000 | PACK | Freq: Once | INTRAVENOUS | Status: AC | PRN
  Administered 2013-10-18: 25 via INTRAVENOUS

## 2013-10-23 DIAGNOSIS — M79609 Pain in unspecified limb: Secondary | ICD-10-CM | POA: Diagnosis not present

## 2013-10-23 DIAGNOSIS — M7981 Nontraumatic hematoma of soft tissue: Secondary | ICD-10-CM | POA: Diagnosis not present

## 2013-10-23 DIAGNOSIS — I8310 Varicose veins of unspecified lower extremity with inflammation: Secondary | ICD-10-CM | POA: Diagnosis not present

## 2013-10-24 ENCOUNTER — Encounter (HOSPITAL_COMMUNITY): Payer: Medicare Other

## 2013-11-05 DIAGNOSIS — B351 Tinea unguium: Secondary | ICD-10-CM | POA: Diagnosis not present

## 2013-11-05 DIAGNOSIS — M79609 Pain in unspecified limb: Secondary | ICD-10-CM | POA: Diagnosis not present

## 2013-11-07 DIAGNOSIS — Z471 Aftercare following joint replacement surgery: Secondary | ICD-10-CM | POA: Diagnosis not present

## 2013-11-07 DIAGNOSIS — Z96659 Presence of unspecified artificial knee joint: Secondary | ICD-10-CM | POA: Diagnosis not present

## 2013-12-04 DIAGNOSIS — M47812 Spondylosis without myelopathy or radiculopathy, cervical region: Secondary | ICD-10-CM | POA: Diagnosis not present

## 2013-12-04 DIAGNOSIS — IMO0002 Reserved for concepts with insufficient information to code with codable children: Secondary | ICD-10-CM | POA: Diagnosis not present

## 2013-12-04 DIAGNOSIS — Z6826 Body mass index (BMI) 26.0-26.9, adult: Secondary | ICD-10-CM | POA: Diagnosis not present

## 2014-01-09 DIAGNOSIS — M542 Cervicalgia: Secondary | ICD-10-CM | POA: Diagnosis not present

## 2014-01-09 DIAGNOSIS — M5412 Radiculopathy, cervical region: Secondary | ICD-10-CM | POA: Diagnosis not present

## 2014-01-14 DIAGNOSIS — B351 Tinea unguium: Secondary | ICD-10-CM | POA: Diagnosis not present

## 2014-01-14 DIAGNOSIS — M79609 Pain in unspecified limb: Secondary | ICD-10-CM | POA: Diagnosis not present

## 2014-02-04 ENCOUNTER — Encounter: Payer: Medicare Other | Admitting: Vascular Surgery

## 2014-02-06 DIAGNOSIS — M5412 Radiculopathy, cervical region: Secondary | ICD-10-CM | POA: Diagnosis not present

## 2014-02-06 DIAGNOSIS — M542 Cervicalgia: Secondary | ICD-10-CM | POA: Diagnosis not present

## 2014-02-06 DIAGNOSIS — M47812 Spondylosis without myelopathy or radiculopathy, cervical region: Secondary | ICD-10-CM | POA: Diagnosis not present

## 2014-02-06 DIAGNOSIS — IMO0002 Reserved for concepts with insufficient information to code with codable children: Secondary | ICD-10-CM | POA: Diagnosis not present

## 2014-02-07 DIAGNOSIS — M542 Cervicalgia: Secondary | ICD-10-CM | POA: Diagnosis not present

## 2014-02-07 DIAGNOSIS — M5412 Radiculopathy, cervical region: Secondary | ICD-10-CM | POA: Diagnosis not present

## 2014-02-12 ENCOUNTER — Encounter: Payer: Self-pay | Admitting: Vascular Surgery

## 2014-02-13 ENCOUNTER — Encounter: Payer: Self-pay | Admitting: Vascular Surgery

## 2014-02-13 ENCOUNTER — Ambulatory Visit (INDEPENDENT_AMBULATORY_CARE_PROVIDER_SITE_OTHER): Payer: Medicare Other | Admitting: Vascular Surgery

## 2014-02-13 VITALS — BP 105/73 | HR 99 | Resp 18 | Ht 61.0 in | Wt 141.0 lb

## 2014-02-13 DIAGNOSIS — I83893 Varicose veins of bilateral lower extremities with other complications: Secondary | ICD-10-CM

## 2014-02-13 DIAGNOSIS — Z23 Encounter for immunization: Secondary | ICD-10-CM | POA: Diagnosis not present

## 2014-02-13 NOTE — Progress Notes (Signed)
Patient name: [name] MRN: [id number] DOB: 04/29/1934 Sex: female     Reason for referral:  Chief Complaint  Patient presents with  . New Evaluation    2nd opinion     HISTORY OF PRESENT ILLNESS: Patient is a very pleasant 80 year old female seen today for discussion of venous issues. I had seen her husband in the past for evaluation following frostbite injury. I do have extensive medical records from [place] [facility]. She had undergone prior bilateral great saphenous vein and left small saphenous vein ablation in 2013. She continues to have persistent discomfort in her left leg. She responds reports that she has a burning and stinging sensation in her lower calf and extending into her foot. She does have a history of bilateral knee replacement also has a history of spine fusion with recent spinal injection. She has had recommendation for foam sclerotherapy of tributary were callosities for possible symptom relief of her left leg. She reports that she really never had any improvement in her leg discomfort following the vein treatments in the past  Past Medical History  Diagnosis Date  . Hypertension   . Fracture of vertebra     x 3  . Osteoporosis   . DI (detrusor instability)   . Menopausal symptoms   . Hypothyroidism   . Varicose veins     Past Surgical History  Procedure Laterality Date  . Spinal fusion  2011  . Replacement total knee  2008,  2011    RIGHT 2008, LEFT 2011  . Bladder suspension  2011    CRYOMESH  . Abdominal hysterectomy  1992    TAH,BSO  . Knee surgery  1992, 2006  . Cholecystectomy  1992  . Carpal tunnel release  1982  . Vertebral surgery      T-8, T-10. T-12  DR Roselee Culver  . Oophorectomy      BSO    History   Social History  . Marital Status: Married    Spouse Name: N/A    Number of Children: N/A  . Years of Education: N/A   Occupational History  . Not on file.   Social History Main Topics  . Smoking status: Never  Smoker   . Smokeless tobacco: Never Used  . Alcohol Use: No  . Drug Use: No  . Sexual Activity: Yes    Birth Control/ Protection: Surgical   Other Topics Concern  . Not on file   Social History Narrative  . No narrative on file    Family History  Problem Relation Age of Onset  . Heart disease Mother   . Heart disease Father   . Stroke Father   . Hypertension Sister   . Diabetes Sister   . Cancer Sister     OVARIAN CA  . Hypertension Brother   . Heart disease Brother   . Diabetes Sister   . Cancer Sister     Colon    Allergies as of 02/13/2014  . (No Known Allergies)    Current Outpatient Prescriptions on File Prior to Visit  Medication Sig Dispense Refill  . ALPRAZolam (XANAX) 0.5 MG tablet Take 0.5 mg by mouth at bedtime as needed.      Marland Kitchen amLODipine (NORVASC) 10 MG tablet Take 10 mg by mouth daily.        Marland Kitchen aspirin 81 MG tablet Take 81 mg by mouth daily.      . calcium carbonate (OS-CAL - DOSED IN MG OF ELEMENTAL CALCIUM)  1250 MG tablet Take 1 tablet by mouth daily.        Marland Kitchen dexlansoprazole (DEXILANT) 60 MG capsule Take 60 mg by mouth daily.        Marland Kitchen escitalopram (LEXAPRO) 10 MG tablet Take 10 mg by mouth daily.      Marland Kitchen estradiol (VIVELLE-DOT) 0.025 MG/24HR Place 1 patch onto the skin 2 (two) times a week. Uses occasionally.  24 patch  3  . levothyroxine (SYNTHROID, LEVOTHROID) 25 MCG tablet Take 25 mcg by mouth daily.        . methylcellulose (ARTIFICIAL TEARS) 1 % ophthalmic solution Place 1 drop into both eyes 2 (two) times daily.        . metoprolol succinate (TOPROL-XL) 25 MG 24 hr tablet Take 25 mg by mouth daily.        . Multiple Vitamin (MULTIVITAMIN) tablet Take 1 tablet by mouth daily.        Marland Kitchen triamterene-hydrochlorothiazide (MAXZIDE-25) 37.5-25 MG per tablet Take 1 tablet by mouth daily.        . vitamin C (ASCORBIC ACID) 500 MG tablet Take 500 mg by mouth daily.        . Vitamin D, Ergocalciferol, (DRISDOL) 50000 UNITS CAPS Take 50,000 Units by mouth  every 7 (seven) days. Take on Friday       . zolpidem (AMBIEN) 10 MG tablet Take 10 mg by mouth at bedtime.        . metroNIDAZOLE (METROGEL VAGINAL) 0.75 % vaginal gel Use in vagina at hs for 5 nights.  70 g  0   No current facility-administered medications on file prior to visit.     REVIEW OF SYSTEMS:  Positives indicated with an "X"  CARDIOVASCULAR:  [ ]  chest pain   [ ]  chest pressure   [ ]  palpitations   [ ]  orthopnea   [ ]  dyspnea on exertion   [ ]  claudication   [ ]  rest pain   [ ]  DVT   [ ]  phlebitis PULMONARY:   [ ]  productive cough   [ ]  asthma   [ ]  wheezing NEUROLOGIC:   [ ]  weakness  [ ]  paresthesias  [ ]  aphasia  [ ]  amaurosis  [ ]  dizziness HEMATOLOGIC:   [ ]  bleeding problems   [ ]  clotting disorders MUSCULOSKELETAL:  [ ]  joint pain   [ ]  joint swelling GASTROINTESTINAL: [ ]   blood in stool  [ ]   hematemesis GENITOURINARY:  [ ]   dysuria  [ ]   hematuria PSYCHIATRIC:  [ ]  history of major depression INTEGUMENTARY:  [ ]  rashes  [ ]  ulcers CONSTITUTIONAL:  [ ]  fever   [ ]  chills  PHYSICAL EXAMINATION:  General: The patient is a well-nourished female, in no acute distress. Vital signs are BP 105/73  Pulse 99  Resp 18  Ht 5\' 1"  (1.549 m)  Wt 141 lb (63.957 kg)  BMI 26.66 kg/m2 Pulmonary: There is a good air exchange  Musculoskeletal: There are no major deformities.  There is no significant extremity pain. Neurologic: No focal weakness or paresthesias are detected, Skin: There are no ulcer or rashes noted. Psychiatric: The patient has normal affect. Cardiovascular: 2+ results pedis pulses bilaterally Small curvature varicosities scattered on her right and left legs. These were all quite small. Also some telangiectasia   Vascular Lab Studies:   I reviewed multiple prior left leg duplex from [facility]. Most recently in May of 2015. These do not show any significant reflux.  Impression and Plan:  I reimage her veins to myself with SonoSite ultrasound  guidance there is no significant reflux in her greater small saphenous vein. She does have some scattered small varicosities in her calves. I feel she had very little chance that this will make any significant improvement of her symptoms. This is not typical venous pathology. She is describing a burning sensation to her feet which are more likely nerve related. She was reassured discussion. I would not recommend any further treatment for evaluation. We'll see [place] on as-needed basis    Missy Baksh [facility] [place] Office: [phone]

## 2014-02-18 ENCOUNTER — Encounter: Payer: Medicare Other | Admitting: Vascular Surgery

## 2014-03-05 DIAGNOSIS — M810 Age-related osteoporosis without current pathological fracture: Secondary | ICD-10-CM | POA: Diagnosis not present

## 2014-03-05 DIAGNOSIS — E785 Hyperlipidemia, unspecified: Secondary | ICD-10-CM | POA: Diagnosis not present

## 2014-03-05 DIAGNOSIS — R8299 Other abnormal findings in urine: Secondary | ICD-10-CM | POA: Diagnosis not present

## 2014-03-05 DIAGNOSIS — R8290 Unspecified abnormal findings in urine: Secondary | ICD-10-CM | POA: Diagnosis not present

## 2014-03-05 DIAGNOSIS — E039 Hypothyroidism, unspecified: Secondary | ICD-10-CM | POA: Diagnosis not present

## 2014-03-05 DIAGNOSIS — I10 Essential (primary) hypertension: Secondary | ICD-10-CM | POA: Diagnosis not present

## 2014-03-07 ENCOUNTER — Ambulatory Visit (INDEPENDENT_AMBULATORY_CARE_PROVIDER_SITE_OTHER): Payer: Medicare Other | Admitting: Interventional Cardiology

## 2014-03-07 ENCOUNTER — Encounter: Payer: Self-pay | Admitting: Interventional Cardiology

## 2014-03-07 VITALS — BP 124/80 | HR 73 | Ht 61.0 in | Wt 142.4 lb

## 2014-03-07 DIAGNOSIS — E785 Hyperlipidemia, unspecified: Secondary | ICD-10-CM | POA: Insufficient documentation

## 2014-03-07 DIAGNOSIS — I10 Essential (primary) hypertension: Secondary | ICD-10-CM | POA: Insufficient documentation

## 2014-03-07 DIAGNOSIS — I2510 Atherosclerotic heart disease of native coronary artery without angina pectoris: Secondary | ICD-10-CM | POA: Diagnosis not present

## 2014-03-07 NOTE — Patient Instructions (Addendum)
Your physician recommends that you continue on your current medications as directed. Please refer to the Current Medication list given to you today.  Your physician wants you to follow-up in: 1 year ov You will receive a reminder letter in the mail two months in advance. If you don't receive a letter, please call our office to schedule the follow-up appointment.  

## 2014-03-07 NOTE — Progress Notes (Signed)
Patient ID: [name], female   DOB: 06-Oct-1933, 80 y.o.   MRN: [id number]    [address], [address] [place], [place]  [address] Phone: [phone] Fax:  [phone]  Date:  03/07/2014   ID:  [name], DOB 15-Sep-1933, MRN [id number]  PCP:  Geoffery Lyons, MD   ASSESSMENT:  1. Nonobstructive CAD, asymptomatic 2. Hypertension, controlled 3. Hyperlipidemia followed by primary care  PLAN:  1. Patient is doing well. She does not need continuous cardiology followup but she refuses to go on a when necessary basis. 2. I encouraged aerobic activity 3. She should call if any chest discomfort oral we will otherwise see her in 12-18 months   SUBJECTIVE: [name] is a 80 y.o. female who is doing well. With no cardiovascular complaints. She denies claudication, palpitations, chest pain, and other cardiac complaints.   Wt Readings from Last 3 Encounters:  03/07/14 142 lb 6.4 oz (64.592 kg)  02/13/14 141 lb (63.957 kg)  01/05/13 135 lb (61.236 kg)     Past Medical History  Diagnosis Date  . Hypertension   . Fracture of vertebra     x 3  . Osteoporosis   . DI (detrusor instability)   . Menopausal symptoms   . Hypothyroidism   . Varicose veins     Current Outpatient Prescriptions  Medication Sig Dispense Refill  . ALPRAZolam (XANAX) 0.5 MG tablet Take 0.5 mg by mouth at bedtime as needed.      Marland Kitchen amLODipine (NORVASC) 10 MG tablet Take 10 mg by mouth daily.        Marland Kitchen aspirin 81 MG tablet Take 81 mg by mouth daily.      . calcium carbonate (OS-CAL - DOSED IN MG OF ELEMENTAL CALCIUM) 1250 MG tablet Take 1 tablet by mouth daily.        . CRESTOR 5 MG tablet       . dexlansoprazole (DEXILANT) 60 MG capsule Take 60 mg by mouth daily.        . ENDOCET 5-325 MG per tablet       . escitalopram (LEXAPRO) 10 MG tablet Take 10 mg by mouth daily.      Marland Kitchen levothyroxine (SYNTHROID, LEVOTHROID) 25 MCG tablet Take 25 mcg by mouth daily.        . methylcellulose (ARTIFICIAL  TEARS) 1 % ophthalmic solution Place 1 drop into both eyes 2 (two) times daily.        . metoprolol succinate (TOPROL-XL) 25 MG 24 hr tablet Take 25 mg by mouth daily.        . Multiple Vitamin (MULTIVITAMIN) tablet Take 1 tablet by mouth daily.        . naproxen (NAPROSYN) 500 MG tablet       . triamterene-hydrochlorothiazide (MAXZIDE-25) 37.5-25 MG per tablet Take 1 tablet by mouth daily.        . vitamin C (ASCORBIC ACID) 500 MG tablet Take 500 mg by mouth daily.        . Vitamin D, Ergocalciferol, (DRISDOL) 50000 UNITS CAPS Take 50,000 Units by mouth every 7 (seven) days. Take on Friday       . zolpidem (AMBIEN) 10 MG tablet Take 10 mg by mouth at bedtime.         No current facility-administered medications for this visit.    Allergies:   No Known Allergies  Social History:  The patient  reports that she has never smoked. She has never used smokeless  tobacco. She reports that she does not drink alcohol or use illicit drugs.   ROS:  Please see the history of present illness.   No syncope or claudication   All other systems reviewed and negative.   OBJECTIVE: VS:  BP 124/80  Pulse 73  Ht 5\' 1"  (1.549 m)  Wt 142 lb 6.4 oz (64.592 kg)  BMI 26.92 kg/m2 Well nourished, well developed, in no acute distress, healthy-appearing  HEENT: normal Neck: JVD flat. Carotid bruit absent  Cardiac:  normal S1, S2; RRR; no murmur Lungs:  clear to auscultation bilaterally, no wheezing, rhonchi or rales Abd: soft, nontender, no hepatomegaly Ext: Edema absent. Pulses 2+ Skin: warm and dry Neuro:  CNs 2-12 intact, no focal abnormalities noted  EKG:  Not performed       Signed, Illene Labrador III, MD 03/07/2014 4:20 PM

## 2014-03-17 DIAGNOSIS — Z6826 Body mass index (BMI) 26.0-26.9, adult: Secondary | ICD-10-CM | POA: Diagnosis not present

## 2014-03-17 DIAGNOSIS — E785 Hyperlipidemia, unspecified: Secondary | ICD-10-CM | POA: Diagnosis not present

## 2014-03-17 DIAGNOSIS — Z0000 Encounter for general adult medical examination without abnormal findings: Secondary | ICD-10-CM | POA: Diagnosis not present

## 2014-03-17 DIAGNOSIS — I2510 Atherosclerotic heart disease of native coronary artery without angina pectoris: Secondary | ICD-10-CM | POA: Diagnosis not present

## 2014-03-17 DIAGNOSIS — Z1389 Encounter for screening for other disorder: Secondary | ICD-10-CM | POA: Diagnosis not present

## 2014-03-17 DIAGNOSIS — Z008 Encounter for other general examination: Secondary | ICD-10-CM | POA: Diagnosis not present

## 2014-03-17 DIAGNOSIS — K219 Gastro-esophageal reflux disease without esophagitis: Secondary | ICD-10-CM | POA: Diagnosis not present

## 2014-03-17 DIAGNOSIS — Z23 Encounter for immunization: Secondary | ICD-10-CM | POA: Diagnosis not present

## 2014-03-17 DIAGNOSIS — M5380 Other specified dorsopathies, site unspecified: Secondary | ICD-10-CM | POA: Diagnosis not present

## 2014-03-17 DIAGNOSIS — I10 Essential (primary) hypertension: Secondary | ICD-10-CM | POA: Diagnosis not present

## 2014-03-19 DIAGNOSIS — Z1212 Encounter for screening for malignant neoplasm of rectum: Secondary | ICD-10-CM | POA: Diagnosis not present

## 2014-03-24 DIAGNOSIS — H6123 Impacted cerumen, bilateral: Secondary | ICD-10-CM | POA: Diagnosis not present

## 2014-03-24 DIAGNOSIS — H903 Sensorineural hearing loss, bilateral: Secondary | ICD-10-CM | POA: Diagnosis not present

## 2014-03-25 DIAGNOSIS — B351 Tinea unguium: Secondary | ICD-10-CM | POA: Diagnosis not present

## 2014-03-25 DIAGNOSIS — M79676 Pain in unspecified toe(s): Secondary | ICD-10-CM | POA: Diagnosis not present

## 2014-03-26 ENCOUNTER — Other Ambulatory Visit: Payer: Self-pay | Admitting: Gastroenterology

## 2014-03-26 DIAGNOSIS — R1310 Dysphagia, unspecified: Secondary | ICD-10-CM | POA: Diagnosis not present

## 2014-03-26 DIAGNOSIS — R1013 Epigastric pain: Secondary | ICD-10-CM | POA: Diagnosis not present

## 2014-03-27 ENCOUNTER — Other Ambulatory Visit: Payer: Self-pay | Admitting: Gastroenterology

## 2014-03-27 ENCOUNTER — Ambulatory Visit
Admission: RE | Admit: 2014-03-27 | Discharge: 2014-03-27 | Disposition: A | Discharge status: Home / Self Care | Payer: Medicare Other | Source: Ambulatory Visit | Attending: Gastroenterology | Admitting: Gastroenterology

## 2014-03-27 DIAGNOSIS — R1310 Dysphagia, unspecified: Secondary | ICD-10-CM

## 2014-03-27 DIAGNOSIS — K449 Diaphragmatic hernia without obstruction or gangrene: Secondary | ICD-10-CM | POA: Diagnosis not present

## 2014-03-27 DIAGNOSIS — K219 Gastro-esophageal reflux disease without esophagitis: Secondary | ICD-10-CM | POA: Diagnosis not present

## 2014-03-31 ENCOUNTER — Encounter: Payer: Self-pay | Admitting: Interventional Cardiology

## 2014-04-02 DIAGNOSIS — M79642 Pain in left hand: Secondary | ICD-10-CM | POA: Diagnosis not present

## 2014-04-07 ENCOUNTER — Other Ambulatory Visit: Payer: Self-pay | Admitting: Dermatology

## 2014-04-07 DIAGNOSIS — L570 Actinic keratosis: Secondary | ICD-10-CM | POA: Diagnosis not present

## 2014-04-07 DIAGNOSIS — D485 Neoplasm of uncertain behavior of skin: Secondary | ICD-10-CM | POA: Diagnosis not present

## 2014-04-07 DIAGNOSIS — L821 Other seborrheic keratosis: Secondary | ICD-10-CM | POA: Diagnosis not present

## 2014-04-07 DIAGNOSIS — Z85828 Personal history of other malignant neoplasm of skin: Secondary | ICD-10-CM | POA: Diagnosis not present

## 2014-04-07 DIAGNOSIS — L439 Lichen planus, unspecified: Secondary | ICD-10-CM | POA: Diagnosis not present

## 2014-04-14 DIAGNOSIS — M5416 Radiculopathy, lumbar region: Secondary | ICD-10-CM | POA: Diagnosis not present

## 2014-04-14 DIAGNOSIS — M47816 Spondylosis without myelopathy or radiculopathy, lumbar region: Secondary | ICD-10-CM | POA: Diagnosis not present

## 2014-06-10 DIAGNOSIS — M216X1 Other acquired deformities of right foot: Secondary | ICD-10-CM | POA: Diagnosis not present

## 2014-06-10 DIAGNOSIS — M79671 Pain in right foot: Secondary | ICD-10-CM | POA: Diagnosis not present

## 2014-07-11 DIAGNOSIS — M5416 Radiculopathy, lumbar region: Secondary | ICD-10-CM | POA: Diagnosis not present

## 2014-07-11 DIAGNOSIS — M79605 Pain in left leg: Secondary | ICD-10-CM | POA: Diagnosis not present

## 2014-07-11 DIAGNOSIS — Z9889 Other specified postprocedural states: Secondary | ICD-10-CM | POA: Diagnosis not present

## 2014-07-16 DIAGNOSIS — H2513 Age-related nuclear cataract, bilateral: Secondary | ICD-10-CM | POA: Diagnosis not present

## 2014-07-16 DIAGNOSIS — H5203 Hypermetropia, bilateral: Secondary | ICD-10-CM | POA: Diagnosis not present

## 2014-08-05 DIAGNOSIS — L659 Nonscarring hair loss, unspecified: Secondary | ICD-10-CM | POA: Diagnosis not present

## 2014-08-05 DIAGNOSIS — R5381 Other malaise: Secondary | ICD-10-CM | POA: Diagnosis not present

## 2014-08-05 DIAGNOSIS — I10 Essential (primary) hypertension: Secondary | ICD-10-CM | POA: Diagnosis not present

## 2014-08-12 DIAGNOSIS — I8312 Varicose veins of left lower extremity with inflammation: Secondary | ICD-10-CM | POA: Diagnosis not present

## 2014-08-12 DIAGNOSIS — I8311 Varicose veins of right lower extremity with inflammation: Secondary | ICD-10-CM | POA: Diagnosis not present

## 2014-08-19 DIAGNOSIS — D2371 Other benign neoplasm of skin of right lower limb, including hip: Secondary | ICD-10-CM | POA: Diagnosis not present

## 2014-08-19 DIAGNOSIS — M216X1 Other acquired deformities of right foot: Secondary | ICD-10-CM | POA: Diagnosis not present

## 2014-08-19 DIAGNOSIS — M79671 Pain in right foot: Secondary | ICD-10-CM | POA: Diagnosis not present

## 2014-08-27 DIAGNOSIS — M79604 Pain in right leg: Secondary | ICD-10-CM | POA: Diagnosis not present

## 2014-08-27 DIAGNOSIS — M79605 Pain in left leg: Secondary | ICD-10-CM | POA: Diagnosis not present

## 2014-09-10 DIAGNOSIS — Z6825 Body mass index (BMI) 25.0-25.9, adult: Secondary | ICD-10-CM | POA: Diagnosis not present

## 2014-09-10 DIAGNOSIS — M5416 Radiculopathy, lumbar region: Secondary | ICD-10-CM | POA: Diagnosis not present

## 2014-09-26 DIAGNOSIS — M4306 Spondylolysis, lumbar region: Secondary | ICD-10-CM | POA: Diagnosis not present

## 2014-09-26 DIAGNOSIS — M545 Low back pain: Secondary | ICD-10-CM | POA: Diagnosis not present

## 2014-09-26 DIAGNOSIS — M5416 Radiculopathy, lumbar region: Secondary | ICD-10-CM | POA: Diagnosis not present

## 2014-09-26 DIAGNOSIS — M47816 Spondylosis without myelopathy or radiculopathy, lumbar region: Secondary | ICD-10-CM | POA: Diagnosis not present

## 2014-10-06 DIAGNOSIS — L570 Actinic keratosis: Secondary | ICD-10-CM | POA: Diagnosis not present

## 2014-10-06 DIAGNOSIS — Z85828 Personal history of other malignant neoplasm of skin: Secondary | ICD-10-CM | POA: Diagnosis not present

## 2014-10-06 DIAGNOSIS — L821 Other seborrheic keratosis: Secondary | ICD-10-CM | POA: Diagnosis not present

## 2014-10-06 DIAGNOSIS — D0421 Carcinoma in situ of skin of right ear and external auricular canal: Secondary | ICD-10-CM | POA: Diagnosis not present

## 2014-10-06 DIAGNOSIS — D0422 Carcinoma in situ of skin of left ear and external auricular canal: Secondary | ICD-10-CM | POA: Diagnosis not present

## 2014-10-06 DIAGNOSIS — D485 Neoplasm of uncertain behavior of skin: Secondary | ICD-10-CM | POA: Diagnosis not present

## 2014-10-07 DIAGNOSIS — Z1231 Encounter for screening mammogram for malignant neoplasm of breast: Secondary | ICD-10-CM | POA: Diagnosis not present

## 2014-10-07 DIAGNOSIS — Z01419 Encounter for gynecological examination (general) (routine) without abnormal findings: Secondary | ICD-10-CM | POA: Diagnosis not present

## 2014-10-07 DIAGNOSIS — Z124 Encounter for screening for malignant neoplasm of cervix: Secondary | ICD-10-CM | POA: Diagnosis not present

## 2014-10-30 DIAGNOSIS — M5416 Radiculopathy, lumbar region: Secondary | ICD-10-CM | POA: Diagnosis not present

## 2014-10-31 DIAGNOSIS — M47816 Spondylosis without myelopathy or radiculopathy, lumbar region: Secondary | ICD-10-CM | POA: Diagnosis not present

## 2014-10-31 DIAGNOSIS — M5126 Other intervertebral disc displacement, lumbar region: Secondary | ICD-10-CM | POA: Diagnosis not present

## 2014-10-31 DIAGNOSIS — M5416 Radiculopathy, lumbar region: Secondary | ICD-10-CM | POA: Diagnosis not present

## 2014-11-04 DIAGNOSIS — M216X1 Other acquired deformities of right foot: Secondary | ICD-10-CM | POA: Diagnosis not present

## 2014-11-04 DIAGNOSIS — M79671 Pain in right foot: Secondary | ICD-10-CM | POA: Diagnosis not present

## 2014-11-04 DIAGNOSIS — D2371 Other benign neoplasm of skin of right lower limb, including hip: Secondary | ICD-10-CM | POA: Diagnosis not present

## 2014-11-05 DIAGNOSIS — M4806 Spinal stenosis, lumbar region: Secondary | ICD-10-CM | POA: Diagnosis not present

## 2014-11-05 DIAGNOSIS — Z6825 Body mass index (BMI) 25.0-25.9, adult: Secondary | ICD-10-CM | POA: Diagnosis not present

## 2014-11-07 ENCOUNTER — Emergency Department (HOSPITAL_BASED_OUTPATIENT_CLINIC_OR_DEPARTMENT_OTHER): Payer: Medicare Other

## 2014-11-07 ENCOUNTER — Encounter (HOSPITAL_BASED_OUTPATIENT_CLINIC_OR_DEPARTMENT_OTHER): Payer: Self-pay

## 2014-11-07 ENCOUNTER — Emergency Department (HOSPITAL_BASED_OUTPATIENT_CLINIC_OR_DEPARTMENT_OTHER)
Admission: EM | Admit: 2014-11-07 | Discharge: 2014-11-07 | Disposition: A | Discharge status: Home / Self Care | Payer: Medicare Other | Attending: Emergency Medicine | Admitting: Emergency Medicine

## 2014-11-07 DIAGNOSIS — Y998 Other external cause status: Secondary | ICD-10-CM | POA: Insufficient documentation

## 2014-11-07 DIAGNOSIS — Z7982 Long term (current) use of aspirin: Secondary | ICD-10-CM | POA: Diagnosis not present

## 2014-11-07 DIAGNOSIS — I10 Essential (primary) hypertension: Secondary | ICD-10-CM | POA: Insufficient documentation

## 2014-11-07 DIAGNOSIS — Z7951 Long term (current) use of inhaled steroids: Secondary | ICD-10-CM | POA: Insufficient documentation

## 2014-11-07 DIAGNOSIS — IMO0002 Reserved for concepts with insufficient information to code with codable children: Secondary | ICD-10-CM

## 2014-11-07 DIAGNOSIS — E039 Hypothyroidism, unspecified: Secondary | ICD-10-CM | POA: Diagnosis not present

## 2014-11-07 DIAGNOSIS — Y9389 Activity, other specified: Secondary | ICD-10-CM | POA: Insufficient documentation

## 2014-11-07 DIAGNOSIS — Y9289 Other specified places as the place of occurrence of the external cause: Secondary | ICD-10-CM | POA: Diagnosis not present

## 2014-11-07 DIAGNOSIS — Z8679 Personal history of other diseases of the circulatory system: Secondary | ICD-10-CM | POA: Diagnosis not present

## 2014-11-07 DIAGNOSIS — Z8742 Personal history of other diseases of the female genital tract: Secondary | ICD-10-CM | POA: Diagnosis not present

## 2014-11-07 DIAGNOSIS — S71112A Laceration without foreign body, left thigh, initial encounter: Secondary | ICD-10-CM | POA: Insufficient documentation

## 2014-11-07 DIAGNOSIS — Z79899 Other long term (current) drug therapy: Secondary | ICD-10-CM | POA: Insufficient documentation

## 2014-11-07 DIAGNOSIS — Y93H2 Activity, gardening and landscaping: Secondary | ICD-10-CM | POA: Insufficient documentation

## 2014-11-07 DIAGNOSIS — M810 Age-related osteoporosis without current pathological fracture: Secondary | ICD-10-CM | POA: Insufficient documentation

## 2014-11-07 DIAGNOSIS — S79922A Unspecified injury of left thigh, initial encounter: Secondary | ICD-10-CM | POA: Diagnosis present

## 2014-11-07 DIAGNOSIS — W271XXA Contact with garden tool, initial encounter: Secondary | ICD-10-CM | POA: Insufficient documentation

## 2014-11-07 MED ORDER — LIDOCAINE HCL 2 % IJ SOLN
10.0000 mL | Freq: Once | INTRAMUSCULAR | Status: AC
  Administered 2014-11-07: 200 mg via INTRADERMAL
  Filled 2014-11-07: qty 20

## 2014-11-07 NOTE — ED Notes (Signed)
Ice pack applied for patient comfort.

## 2014-11-07 NOTE — ED Notes (Signed)
Pt fell while trimming scrubs-reports lac to inner left thigh-drove self to ED

## 2014-11-07 NOTE — ED Notes (Signed)
Tatyana, PA-C in room with patient now.

## 2014-11-07 NOTE — Discharge Instructions (Signed)
Keep your wound clean. Wash with soap and water. Apply Triple Antibiotic ointment twice a day. Keep it covered. Follow-up with your primary care doctor in 10 days for suture removal. Watch for any signs of infection such as swelling, redness, drainage. If seeing any signs, please see your doctor or return here for reevaluation.  Take Tylenol or Motrin for pain.  Laceration Care, Adult A laceration is a cut or lesion that goes through all layers of the skin and into the tissue just beneath the skin. TREATMENT  Some lacerations may not require closure. Some lacerations may not be able to be closed due to an increased risk of infection. It is important to see your caregiver as soon as possible after an injury to minimize the risk of infection and maximize the opportunity for successful closure. If closure is appropriate, pain medicines may be given, if needed. The wound will be cleaned to help prevent infection. Your caregiver will use stitches (sutures), staples, wound glue (adhesive), or skin adhesive strips to repair the laceration. These tools bring the skin edges together to allow for faster healing and a better cosmetic outcome. However, all wounds will heal with a scar. Once the wound has healed, scarring can be minimized by covering the wound with sunscreen during the day for 1 full year. HOME CARE INSTRUCTIONS  For sutures or staples:  Keep the wound clean and dry.  If you were given a bandage (dressing), you should change it at least once a day. Also, change the dressing if it becomes wet or dirty, or as directed by your caregiver.  Wash the wound with soap and water 2 times a day. Rinse the wound off with water to remove all soap. Pat the wound dry with a clean towel.  After cleaning, apply a thin layer of the antibiotic ointment as recommended by your caregiver. This will help prevent infection and keep the dressing from sticking.  You may shower as usual after the first 24 hours. Do not  soak the wound in water until the sutures are removed.  Only take over-the-counter or prescription medicines for pain, discomfort, or fever as directed by your caregiver.  Get your sutures or staples removed as directed by your caregiver. For skin adhesive strips:  Keep the wound clean and dry.  Do not get the skin adhesive strips wet. You may bathe carefully, using caution to keep the wound dry.  If the wound gets wet, pat it dry with a clean towel.  Skin adhesive strips will fall off on their own. You may trim the strips as the wound heals. Do not remove skin adhesive strips that are still stuck to the wound. They will fall off in time. For wound adhesive:  You may briefly wet your wound in the shower or bath. Do not soak or scrub the wound. Do not swim. Avoid periods of heavy perspiration until the skin adhesive has fallen off on its own. After showering or bathing, gently pat the wound dry with a clean towel.  Do not apply liquid medicine, cream medicine, or ointment medicine to your wound while the skin adhesive is in place. This may loosen the film before your wound is healed.  If a dressing is placed over the wound, be careful not to apply tape directly over the skin adhesive. This may cause the adhesive to be pulled off before the wound is healed.  Avoid prolonged exposure to sunlight or tanning lamps while the skin adhesive is in place. Exposure  to ultraviolet light in the first year will darken the scar.  The skin adhesive will usually remain in place for 5 to 10 days, then naturally fall off the skin. Do not pick at the adhesive film. You may need a tetanus shot if:  You cannot remember when you had your last tetanus shot.  You have never had a tetanus shot. If you get a tetanus shot, your arm may swell, get red, and feel warm to the touch. This is common and not a problem. If you need a tetanus shot and you choose not to have one, there is a rare chance of getting tetanus.  Sickness from tetanus can be serious. SEEK MEDICAL CARE IF:   You have redness, swelling, or increasing pain in the wound.  You see a red line that goes away from the wound.  You have yellowish-white fluid (pus) coming from the wound.  You have a fever.  You notice a bad smell coming from the wound or dressing.  Your wound breaks open before or after sutures have been removed.  You notice something coming out of the wound such as wood or glass.  Your wound is on your hand or foot and you cannot move a finger or toe. SEEK IMMEDIATE MEDICAL CARE IF:   Your pain is not controlled with prescribed medicine.  You have severe swelling around the wound causing pain and numbness or a change in color in your arm, hand, leg, or foot.  Your wound splits open and starts bleeding.  You have worsening numbness, weakness, or loss of function of any joint around or beyond the wound.  You develop painful lumps near the wound or on the skin anywhere on your body. MAKE SURE YOU:   Understand these instructions.  Will watch your condition.  Will get help right away if you are not doing well or get worse. Document Released: 05/16/2005 Document Revised: 08/08/2011 Document Reviewed: 11/09/2010 [facility] Patient Information 2015 [place], [place]. This information is not intended to replace advice given to you by your health care provider. Make sure you discuss any questions you have with your health care provider.

## 2014-11-07 NOTE — ED Provider Notes (Signed)
CSN: [id number]     Arrival date & time 11/07/14  1420 History   First MD Initiated Contact with Patient 11/07/14 1512     Chief Complaint  Patient presents with  . Leg Injury     (Consider location/radiation/quality/duration/timing/severity/associated sxs/prior Treatment) HPI [name] is a 81 y.o. female presents to emergency department complaining of left thigh laceration. Patient states she was trimming some shrubs when she cut her thigh on a branch of an [employer]. Patient states that it did not break off. She reports she started bleeding immediately so she applied pressure and wrapped it with an Ace wrap. She was able to finish trimming her bushes, and then drove herself to emergency department. She states her tetanus is up-to-date. She denies any numbness or weakness distal to the laceration. There is no other injuries. She states she washed the wound out a little bit on her own at home, no medications taken prior to coming in.    Past Medical History  Diagnosis Date  . Hypertension   . Fracture of vertebra     x 3  . Osteoporosis   . DI (detrusor instability)   . Menopausal symptoms   . Hypothyroidism   . Varicose veins    Past Surgical History  Procedure Laterality Date  . Spinal fusion  2011  . Replacement total knee  2008,  2011    RIGHT 2008, LEFT 2011  . Bladder suspension  2011    CRYOMESH  . Abdominal hysterectomy  1992    TAH,BSO  . Knee surgery  1992, 2006  . Cholecystectomy  1992  . Carpal tunnel release  1982  . Vertebral surgery      T-8, T-10. T-12  DR Roselee Culver  . Oophorectomy      BSO   Family History  Problem Relation Age of Onset  . Heart disease Mother   . Heart disease Father   . Stroke Father   . Hypertension Sister   . Diabetes Sister   . Cancer Sister     OVARIAN CA  . Hypertension Brother   . Heart disease Brother   . Diabetes Sister   . Cancer Sister     Colon   History  Substance Use Topics  . Smoking status: Never  Smoker   . Smokeless tobacco: Never Used  . Alcohol Use: No   OB History    Gravida Para Term Preterm AB TAB SAB Ectopic Multiple Living   2 2 2       2      Review of Systems  Constitutional: Negative for fever and chills.  Respiratory: Negative for cough, chest tightness and shortness of breath.   Cardiovascular: Negative for chest pain, palpitations and leg swelling.  Musculoskeletal: Positive for myalgias. Negative for neck pain and neck stiffness.  Skin: Positive for wound. Negative for rash.  Neurological: Negative for dizziness, weakness, numbness and headaches.  All other systems reviewed and are negative.     Allergies  Review of patient's allergies indicates no known allergies.  Home Medications   Prior to Admission medications   Medication Sig Start Date End Date Taking? Authorizing Provider  ALPRAZolam Duanne Moron) 0.5 MG tablet Take 0.5 mg by mouth at bedtime as needed.    Historical Provider, MD  amLODipine (NORVASC) 10 MG tablet Take 10 mg by mouth daily.      Historical Provider, MD  aspirin 81 MG tablet Take 81 mg by mouth daily.    Historical Provider, MD  calcium carbonate (OS-CAL - DOSED IN MG OF ELEMENTAL CALCIUM) 1250 MG tablet Take 1 tablet by mouth daily.      Historical Provider, MD  CRESTOR 5 MG tablet  02/19/14   Historical Provider, MD  dexlansoprazole (DEXILANT) 60 MG capsule Take 60 mg by mouth daily.      Historical Provider, MD  ENDOCET 5-325 MG per tablet  02/06/14   Historical Provider, MD  escitalopram (LEXAPRO) 10 MG tablet Take 10 mg by mouth daily.    Historical Provider, MD  levothyroxine (SYNTHROID, LEVOTHROID) 25 MCG tablet Take 25 mcg by mouth daily.      Historical Provider, MD  methylcellulose (ARTIFICIAL TEARS) 1 % ophthalmic solution Place 1 drop into both eyes 2 (two) times daily.      Historical Provider, MD  metoprolol succinate (TOPROL-XL) 25 MG 24 hr tablet Take 25 mg by mouth daily.      Historical Provider, MD  Multiple Vitamin  (MULTIVITAMIN) tablet Take 1 tablet by mouth daily.      Historical Provider, MD  naproxen (NAPROSYN) 500 MG tablet  02/01/14   Historical Provider, MD  triamterene-hydrochlorothiazide (MAXZIDE-25) 37.5-25 MG per tablet Take 1 tablet by mouth daily.      Historical Provider, MD  vitamin C (ASCORBIC ACID) 500 MG tablet Take 500 mg by mouth daily.      Historical Provider, MD  Vitamin D, Ergocalciferol, (DRISDOL) 50000 UNITS CAPS Take 50,000 Units by mouth every 7 (seven) days. Take on Friday     Historical Provider, MD  zolpidem (AMBIEN) 10 MG tablet Take 10 mg by mouth at bedtime.      Historical Provider, MD   BP 132/68 mmHg  Pulse 109  Temp(Src) 98.2 F (36.8 C) (Oral)  Resp 18  Ht 5\' 1"  (1.549 m)  Wt 135 lb (61.236 kg)  BMI 25.52 kg/m2  SpO2 99% Physical Exam  Constitutional: She appears well-developed and well-nourished. No distress.  Eyes: Conjunctivae are normal.  Neck: Neck supple.  Neurological: She is alert.  Skin: Skin is warm and dry.  Large and deep flap laceration to the left medial upper thigh measuring 8cm. Full range of motion of the knee and ankle with normal strength with flexion and extension against resistance. Sensation intact distally in all dermatomes of the leg. Dorsal pedal pulses intact.  Nursing note and vitals reviewed.   ED Course  Procedures (including critical care time) Labs Review Labs Reviewed - No data to display  Imaging Review No results found.   EKG Interpretation None      LACERATION REPAIR Performed by: Renold Genta Authorized by: Jeannett Senior A Consent: Verbal consent obtained. Risks and benefits: risks, benefits and alternatives were discussed Consent given by: patient Patient identity confirmed: provided demographic data Prepped and Draped in normal sterile fashion Wound explored  Laceration Location: left upper medial thigh  Laceration Length: 8cm  No Foreign Bodies seen or palpated  Anesthesia: local  infiltration  Local anesthetic: lidocaine 2% wo epinephrine  Anesthetic total: 3 ml  Irrigation method: syringe Amount of cleaning: standard  Skin closure: prolene 4.0 and vicril rapid 4.0  Number of sutures: 3 deep, 9superficial  Technique: simple interrupted  Patient tolerance: Patient tolerated the procedure well with no immediate complications.   MDM   Final diagnoses:  Laceration  Laceration of thigh, left, initial encounter     Patient would laceration to the left medial upper thigh from a bush branch. X-ray obtained to rule out foreign body and is  negative. I irrigated the wound thoroughly and repaired with sutures. No foreign bodies palpated or seen during exploration of the wound. Patient tolerated procedure well. Plan to discharge home with follow-up primary care doctor.   Filed Vitals:   11/07/14 1431 11/07/14 1659  BP: 132/68 154/91  Pulse: 109 93  Temp: 98.2 F (36.8 C)   TempSrc: Oral   Resp: 18 18  Height: 5\' 1"  (1.549 m)   Weight: 135 lb (61.236 kg)   SpO2: 99% 99%      Jeannett Senior, PA-C 11/07/14 [place], MD 11/09/14 1706

## 2014-11-14 DIAGNOSIS — M47816 Spondylosis without myelopathy or radiculopathy, lumbar region: Secondary | ICD-10-CM | POA: Diagnosis not present

## 2014-11-14 DIAGNOSIS — M545 Low back pain: Secondary | ICD-10-CM | POA: Diagnosis not present

## 2014-11-14 DIAGNOSIS — M5416 Radiculopathy, lumbar region: Secondary | ICD-10-CM | POA: Diagnosis not present

## 2014-11-14 DIAGNOSIS — M4316 Spondylolisthesis, lumbar region: Secondary | ICD-10-CM | POA: Diagnosis not present

## 2014-11-18 DIAGNOSIS — Z4802 Encounter for removal of sutures: Secondary | ICD-10-CM | POA: Diagnosis not present

## 2014-11-18 DIAGNOSIS — Z6826 Body mass index (BMI) 26.0-26.9, adult: Secondary | ICD-10-CM | POA: Diagnosis not present

## 2014-11-18 DIAGNOSIS — S79922A Unspecified injury of left thigh, initial encounter: Secondary | ICD-10-CM | POA: Diagnosis not present

## 2014-12-05 DIAGNOSIS — Z96653 Presence of artificial knee joint, bilateral: Secondary | ICD-10-CM | POA: Diagnosis not present

## 2014-12-05 DIAGNOSIS — Z96652 Presence of left artificial knee joint: Secondary | ICD-10-CM | POA: Diagnosis not present

## 2014-12-05 DIAGNOSIS — Z471 Aftercare following joint replacement surgery: Secondary | ICD-10-CM | POA: Diagnosis not present

## 2014-12-05 DIAGNOSIS — Z96651 Presence of right artificial knee joint: Secondary | ICD-10-CM | POA: Diagnosis not present

## 2015-01-01 DIAGNOSIS — R1013 Epigastric pain: Secondary | ICD-10-CM | POA: Diagnosis not present

## 2015-01-01 DIAGNOSIS — R140 Abdominal distension (gaseous): Secondary | ICD-10-CM | POA: Diagnosis not present

## 2015-01-01 DIAGNOSIS — R1310 Dysphagia, unspecified: Secondary | ICD-10-CM | POA: Diagnosis not present

## 2015-01-13 DIAGNOSIS — M79671 Pain in right foot: Secondary | ICD-10-CM | POA: Diagnosis not present

## 2015-01-13 DIAGNOSIS — D2371 Other benign neoplasm of skin of right lower limb, including hip: Secondary | ICD-10-CM | POA: Diagnosis not present

## 2015-01-28 ENCOUNTER — Other Ambulatory Visit: Payer: Self-pay | Admitting: Gastroenterology

## 2015-01-28 DIAGNOSIS — K30 Functional dyspepsia: Secondary | ICD-10-CM | POA: Diagnosis not present

## 2015-01-28 DIAGNOSIS — K2950 Unspecified chronic gastritis without bleeding: Secondary | ICD-10-CM | POA: Diagnosis not present

## 2015-01-28 DIAGNOSIS — Q399 Congenital malformation of esophagus, unspecified: Secondary | ICD-10-CM | POA: Diagnosis not present

## 2015-01-28 DIAGNOSIS — K2940 Chronic atrophic gastritis without bleeding: Secondary | ICD-10-CM | POA: Diagnosis not present

## 2015-01-28 DIAGNOSIS — K219 Gastro-esophageal reflux disease without esophagitis: Secondary | ICD-10-CM | POA: Diagnosis not present

## 2015-01-28 DIAGNOSIS — K449 Diaphragmatic hernia without obstruction or gangrene: Secondary | ICD-10-CM | POA: Diagnosis not present

## 2015-01-28 DIAGNOSIS — R1310 Dysphagia, unspecified: Secondary | ICD-10-CM | POA: Diagnosis not present

## 2015-02-08 DIAGNOSIS — L253 Unspecified contact dermatitis due to other chemical products: Secondary | ICD-10-CM | POA: Diagnosis not present

## 2015-02-08 DIAGNOSIS — R300 Dysuria: Secondary | ICD-10-CM | POA: Diagnosis not present

## 2015-02-08 DIAGNOSIS — R8299 Other abnormal findings in urine: Secondary | ICD-10-CM | POA: Diagnosis not present

## 2015-02-11 DIAGNOSIS — R8299 Other abnormal findings in urine: Secondary | ICD-10-CM | POA: Diagnosis not present

## 2015-02-11 DIAGNOSIS — R300 Dysuria: Secondary | ICD-10-CM | POA: Diagnosis not present

## 2015-02-27 DIAGNOSIS — Z23 Encounter for immunization: Secondary | ICD-10-CM | POA: Diagnosis not present

## 2015-03-16 DIAGNOSIS — E785 Hyperlipidemia, unspecified: Secondary | ICD-10-CM | POA: Diagnosis not present

## 2015-03-16 DIAGNOSIS — E039 Hypothyroidism, unspecified: Secondary | ICD-10-CM | POA: Diagnosis not present

## 2015-03-16 DIAGNOSIS — M810 Age-related osteoporosis without current pathological fracture: Secondary | ICD-10-CM | POA: Diagnosis not present

## 2015-03-16 DIAGNOSIS — I10 Essential (primary) hypertension: Secondary | ICD-10-CM | POA: Diagnosis not present

## 2015-03-18 ENCOUNTER — Encounter: Payer: Self-pay | Admitting: Interventional Cardiology

## 2015-03-18 ENCOUNTER — Ambulatory Visit (INDEPENDENT_AMBULATORY_CARE_PROVIDER_SITE_OTHER): Payer: Medicare Other | Admitting: Interventional Cardiology

## 2015-03-18 VITALS — BP 110/68 | HR 78 | Ht 61.0 in | Wt 146.2 lb

## 2015-03-18 DIAGNOSIS — R079 Chest pain, unspecified: Secondary | ICD-10-CM

## 2015-03-18 DIAGNOSIS — E785 Hyperlipidemia, unspecified: Secondary | ICD-10-CM

## 2015-03-18 DIAGNOSIS — I10 Essential (primary) hypertension: Secondary | ICD-10-CM | POA: Diagnosis not present

## 2015-03-18 DIAGNOSIS — I2510 Atherosclerotic heart disease of native coronary artery without angina pectoris: Secondary | ICD-10-CM | POA: Diagnosis not present

## 2015-03-18 NOTE — Progress Notes (Signed)
Cardiology Office Note   Date:  03/18/2015   ID:  [name], DOB [birth year], MRN [id number]  PCP:  Geoffery Lyons, MD  Cardiologist:  Sinclair Grooms, MD   Chief Complaint  Patient presents with  . Coronary Artery Disease      History of Present Illness: [name] is a 81 y.o. female who presents for nonobstructive CAD/asymptomatic, essential hypertension, hyperlipidemia, and elderly.  Having significant difficulty with precordial fullness and burning, worse after eating. Recent upper GI endoscopy demonstrated gastric irritation. She is on multiple medications to help with this.  Exertional dyspnea and chest tightness also a complaint. Dyspnea and limits her physical activity. Prior coronary history is that of CT angio demonstrated moderate coronary plaque. Never had a diagnosis of significant underlying obstructive disease. Last evaluation greater than a year ago.  Past Medical History  Diagnosis Date  . Hypertension   . Fracture of vertebra     x 3  . Osteoporosis   . DI (detrusor instability)   . Menopausal symptoms   . Hypothyroidism   . Varicose veins     Past Surgical History  Procedure Laterality Date  . Spinal fusion  2011  . Replacement total knee  2008,  2011    RIGHT 2008, LEFT 2011  . Bladder suspension  2011    CRYOMESH  . Abdominal hysterectomy  1992    TAH,BSO  . Knee surgery  1992, 2006  . Cholecystectomy  1992  . Carpal tunnel release  1982  . Vertebral surgery      T-8, T-10. T-12  DR Roselee Culver  . Oophorectomy      BSO     Current Outpatient Prescriptions  Medication Sig Dispense Refill  . ALPRAZolam (XANAX) 0.5 MG tablet Take 0.5 mg by mouth at bedtime as needed (restless legs).     Lorrin Mais CR 12.5 MG CR tablet Take 1 tablet by mouth daily.    Marland Kitchen aspirin 81 MG tablet Take 81 mg by mouth daily.    . calcium carbonate (OS-CAL - DOSED IN MG OF ELEMENTAL CALCIUM) 1250 MG tablet Take 1 tablet by mouth daily.      .  clidinium-chlordiazePOXIDE (LIBRAX) 5-2.5 MG capsule Take 1 capsule by mouth 2 (two) times daily.    . CRESTOR 5 MG tablet Take 5 mg by mouth at bedtime.     Marland Kitchen dexlansoprazole (DEXILANT) 60 MG capsule Take 60 mg by mouth daily.      . ENDOCET 5-325 MG per tablet Take 1 tablet by mouth 2 (two) times daily as needed (pain). How many, and how often?    . escitalopram (LEXAPRO) 10 MG tablet Take 10 mg by mouth daily.    Marland Kitchen levothyroxine (SYNTHROID, LEVOTHROID) 25 MCG tablet Take 25 mcg by mouth daily.      . methylcellulose (ARTIFICIAL TEARS) 1 % ophthalmic solution Place 1 drop into both eyes 2 (two) times daily.      . metoprolol succinate (TOPROL-XL) 25 MG 24 hr tablet Take 25 mg by mouth daily.      . Multiple Vitamin (MULTIVITAMIN) tablet Take 1 tablet by mouth daily.      . naproxen (NAPROSYN) 500 MG tablet Take 500 mg by mouth daily as needed (pain).     . NORVASC 5 MG tablet Take 1 tablet by mouth daily.    . pantoprazole (PROTONIX) 40 MG tablet Take 40 mg by mouth daily.    . sucralfate (CARAFATE) 1 G tablet Take  1 tablet by mouth 2 (two) times daily.  3  . triamterene-hydrochlorothiazide (MAXZIDE-25) 37.5-25 MG per tablet Take 1 tablet by mouth daily.      . vitamin C (ASCORBIC ACID) 500 MG tablet Take 500 mg by mouth daily.      . Vitamin D, Ergocalciferol, (DRISDOL) 50000 UNITS CAPS Take 50,000 Units by mouth every 7 (seven) days. Take on Friday      No current facility-administered medications for this visit.    Allergies:   Review of patient's allergies indicates no known allergies.    Social History:  The patient  reports that she has never smoked. She has never used smokeless tobacco. She reports that she does not drink alcohol or use illicit drugs.   Family History:  The patient's family history includes Cancer in her sister and sister; Diabetes in her sister and sister; Heart disease in her brother, father, and mother; Hypertension in her brother and sister; Stroke in her  father.    ROS:  Please see the history of present illness.   Otherwise, review of systems are positive for abdominal discomfort, back discomfort, muscle pain, easy bruising, joint swelling, headache, leg pain, and excessive fatigue..   All other systems are reviewed and negative.    PHYSICAL EXAM: VS:  BP 110/68 mmHg  Pulse 78  Ht 5\' 1"  (1.549 m)  Wt 66.316 kg (146 lb 3.2 oz)  BMI 27.64 kg/m2 , BMI Body mass index is 27.64 kg/(m^2). GEN: Well nourished, well developed, in no acute distress HEENT: normal Neck: no JVD, carotid bruits, or masses Cardiac: RRR.  There is no murmur, rub, or gallop. There is no edema. Respiratory:  clear to auscultation bilaterally, normal work of breathing. GI: soft, nontender, nondistended, + BS MS: no deformity or atrophy Skin: warm and dry, no rash Neuro:  Strength and sensation are intact Psych: euthymic mood, full affect   EKG:  EKG is ordered today. The ekg reveals normal sinus drug, left axis deviation, QS pattern V1 through V4.   Recent Labs: No results found for requested labs within last 365 days.    Lipid Panel No results found for: CHOL, TRIG, HDL, CHOLHDL, VLDL, LDLCALC, LDLDIRECT    Wt Readings from Last 3 Encounters:  03/18/15 66.316 kg (146 lb 3.2 oz)  11/07/14 61.236 kg (135 lb)  03/07/14 64.592 kg (142 lb 6.4 oz)      Other studies Reviewed: Additional studies/ records that were reviewed today include: Prior cardiac database. The findings include discovery that basis for his CAD is a CT angiogram of the coronaries..    ASSESSMENT AND PLAN:  1. Coronary artery disease involving native coronary artery of native heart without angina pectoris CT angiogram without evidence of significant obstruction greater than 6 she is ago.  2. Hyperlipidemia Followed by primary care  3. Essential hypertension Excellent control  4. Chest discomfort  GI versus cardiac.    Current medicines are reviewed at length with the  patient today.  The patient has the following concerns regarding medicines: Concern about the vast quantity of medications that she is currently taking..  The following changes/actions have been instituted:    Stress Cardiolite to exclude CAD masquerading his GI. Hold metoprolol on the morning of the procedure.  Labs/ tests ordered today include:   Orders Placed This Encounter  Procedures  . Myocardial Perfusion Imaging  . EKG 12-Lead     Disposition:   FU with HS in 1 year  Signed, Sinclair Grooms, MD  03/18/2015 10:40 AM    [address], [place] [address] [place], [place]  [address] Phone: [phone]; Fax: [phone]

## 2015-03-18 NOTE — Patient Instructions (Signed)
Medication Instructions:  Your physician recommends that you continue on your current medications as directed. Please refer to the Current Medication list given to you today.   Labwork: None ordered  Testing/Procedures: Your physician has requested that you have en exercise stress myoview. For further information please visit [email or web address]. Please follow instruction sheet, as given.   Follow-Up: Your physician wants you to follow-up in: 1 year with Dr.Smith You will receive a reminder letter in the mail two months in advance. If you don't receive a letter, please call our office to schedule the follow-up appointment.   Any Other Special Instructions Will Be Listed Below (If Applicable).

## 2015-03-23 DIAGNOSIS — M810 Age-related osteoporosis without current pathological fracture: Secondary | ICD-10-CM | POA: Diagnosis not present

## 2015-03-23 DIAGNOSIS — I2510 Atherosclerotic heart disease of native coronary artery without angina pectoris: Secondary | ICD-10-CM | POA: Diagnosis not present

## 2015-03-23 DIAGNOSIS — I129 Hypertensive chronic kidney disease with stage 1 through stage 4 chronic kidney disease, or unspecified chronic kidney disease: Secondary | ICD-10-CM | POA: Diagnosis not present

## 2015-03-23 DIAGNOSIS — E785 Hyperlipidemia, unspecified: Secondary | ICD-10-CM | POA: Diagnosis not present

## 2015-03-23 DIAGNOSIS — I10 Essential (primary) hypertension: Secondary | ICD-10-CM | POA: Diagnosis not present

## 2015-03-23 DIAGNOSIS — M5380 Other specified dorsopathies, site unspecified: Secondary | ICD-10-CM | POA: Diagnosis not present

## 2015-03-23 DIAGNOSIS — E039 Hypothyroidism, unspecified: Secondary | ICD-10-CM | POA: Diagnosis not present

## 2015-03-23 DIAGNOSIS — Z0000 Encounter for general adult medical examination without abnormal findings: Secondary | ICD-10-CM | POA: Diagnosis not present

## 2015-03-23 DIAGNOSIS — Z6827 Body mass index (BMI) 27.0-27.9, adult: Secondary | ICD-10-CM | POA: Diagnosis not present

## 2015-03-23 DIAGNOSIS — F418 Other specified anxiety disorders: Secondary | ICD-10-CM | POA: Diagnosis not present

## 2015-03-25 ENCOUNTER — Telehealth (HOSPITAL_COMMUNITY): Payer: Self-pay | Admitting: *Deleted

## 2015-03-25 NOTE — Telephone Encounter (Signed)
Left message on voicemail in reference to upcoming appointment scheduled for 03/30/15. Phone number given for a call back so details instructions can be given. Hubbard Robinson, RN

## 2015-03-26 ENCOUNTER — Telehealth (HOSPITAL_COMMUNITY): Payer: Self-pay

## 2015-03-26 NOTE — Telephone Encounter (Signed)
Left message on voicemail in reference to upcoming appointment scheduled for 03-30-2015. Phone number given for a call back so details instructions can be given. Oletta Lamas, Gailya Tauer A

## 2015-03-30 ENCOUNTER — Ambulatory Visit (HOSPITAL_COMMUNITY): Payer: Medicare Other | Attending: Internal Medicine

## 2015-03-30 ENCOUNTER — Encounter (HOSPITAL_COMMUNITY): Payer: Medicare Other

## 2015-03-30 DIAGNOSIS — R0609 Other forms of dyspnea: Secondary | ICD-10-CM | POA: Diagnosis not present

## 2015-03-30 DIAGNOSIS — I2510 Atherosclerotic heart disease of native coronary artery without angina pectoris: Secondary | ICD-10-CM

## 2015-03-30 DIAGNOSIS — I10 Essential (primary) hypertension: Secondary | ICD-10-CM | POA: Diagnosis not present

## 2015-03-30 DIAGNOSIS — R002 Palpitations: Secondary | ICD-10-CM | POA: Insufficient documentation

## 2015-03-30 DIAGNOSIS — R079 Chest pain, unspecified: Secondary | ICD-10-CM | POA: Diagnosis not present

## 2015-03-30 LAB — MYOCARDIAL PERFUSION IMAGING
Estimated workload: 7 METS
Exercise duration (min): 5 min
Exercise duration (sec): 0 s
LV dias vol: 45 mL
LV sys vol: 8 mL
MPHR: 141 {beats}/min
Peak HR: 139 {beats}/min
Percent HR: 98 %
RATE: 0.37
Rest HR: 71 {beats}/min
SDS: 0
SRS: 3
SSS: 3
TID: 0.78

## 2015-03-30 MED ORDER — REGADENOSON 0.4 MG/5ML IV SOLN
0.4000 mg | Freq: Once | INTRAVENOUS | Status: AC
  Administered 2015-03-30: 0.4 mg via INTRAVENOUS

## 2015-03-30 MED ORDER — TECHNETIUM TC 99M SESTAMIBI GENERIC - CARDIOLITE
10.8000 | Freq: Once | INTRAVENOUS | Status: AC | PRN
  Administered 2015-03-30: 11 via INTRAVENOUS

## 2015-03-30 MED ORDER — TECHNETIUM TC 99M SESTAMIBI GENERIC - CARDIOLITE
32.9000 | Freq: Once | INTRAVENOUS | Status: AC | PRN
  Administered 2015-03-30: 32.9 via INTRAVENOUS

## 2015-04-06 ENCOUNTER — Telehealth: Payer: Self-pay

## 2015-04-06 DIAGNOSIS — L570 Actinic keratosis: Secondary | ICD-10-CM | POA: Diagnosis not present

## 2015-04-06 DIAGNOSIS — L821 Other seborrheic keratosis: Secondary | ICD-10-CM | POA: Diagnosis not present

## 2015-04-06 DIAGNOSIS — Z85828 Personal history of other malignant neoplasm of skin: Secondary | ICD-10-CM | POA: Diagnosis not present

## 2015-04-06 NOTE — Telephone Encounter (Signed)
Called to give pt myoview results. Left message with pt husband to have pt call the office

## 2015-04-06 NOTE — Telephone Encounter (Signed)
-----   Message from Belva Crome, MD sent at 04/03/2015  2:02 PM EDT ----- Test is low risk without major problems. Reassuring!

## 2015-04-06 NOTE — Telephone Encounter (Signed)
F/u ° ° °Pt returning your call °

## 2015-04-07 NOTE — Telephone Encounter (Signed)
Pt aware of myoview results with verbal understanding. 

## 2015-05-06 DIAGNOSIS — S8001XD Contusion of right knee, subsequent encounter: Secondary | ICD-10-CM | POA: Diagnosis not present

## 2015-05-06 DIAGNOSIS — Z471 Aftercare following joint replacement surgery: Secondary | ICD-10-CM | POA: Diagnosis not present

## 2015-05-06 DIAGNOSIS — Z96653 Presence of artificial knee joint, bilateral: Secondary | ICD-10-CM | POA: Diagnosis not present

## 2015-05-11 DIAGNOSIS — M79641 Pain in right hand: Secondary | ICD-10-CM | POA: Diagnosis not present

## 2015-05-11 DIAGNOSIS — S60221A Contusion of right hand, initial encounter: Secondary | ICD-10-CM | POA: Diagnosis not present

## 2015-05-11 DIAGNOSIS — M25561 Pain in right knee: Secondary | ICD-10-CM | POA: Diagnosis not present

## 2015-05-22 DIAGNOSIS — Z96653 Presence of artificial knee joint, bilateral: Secondary | ICD-10-CM | POA: Diagnosis not present

## 2015-05-22 DIAGNOSIS — Z471 Aftercare following joint replacement surgery: Secondary | ICD-10-CM | POA: Diagnosis not present

## 2015-05-22 DIAGNOSIS — S8001XD Contusion of right knee, subsequent encounter: Secondary | ICD-10-CM | POA: Diagnosis not present

## 2015-05-22 DIAGNOSIS — M25561 Pain in right knee: Secondary | ICD-10-CM | POA: Diagnosis not present

## 2015-05-28 DIAGNOSIS — M79671 Pain in right foot: Secondary | ICD-10-CM | POA: Diagnosis not present

## 2015-05-28 DIAGNOSIS — D2371 Other benign neoplasm of skin of right lower limb, including hip: Secondary | ICD-10-CM | POA: Diagnosis not present

## 2015-06-12 DIAGNOSIS — M4806 Spinal stenosis, lumbar region: Secondary | ICD-10-CM | POA: Diagnosis not present

## 2015-06-12 DIAGNOSIS — M5416 Radiculopathy, lumbar region: Secondary | ICD-10-CM | POA: Diagnosis not present

## 2015-07-20 DIAGNOSIS — H52203 Unspecified astigmatism, bilateral: Secondary | ICD-10-CM | POA: Diagnosis not present

## 2015-07-20 DIAGNOSIS — H2513 Age-related nuclear cataract, bilateral: Secondary | ICD-10-CM | POA: Diagnosis not present

## 2015-08-04 DIAGNOSIS — D2371 Other benign neoplasm of skin of right lower limb, including hip: Secondary | ICD-10-CM | POA: Diagnosis not present

## 2015-08-04 DIAGNOSIS — M79671 Pain in right foot: Secondary | ICD-10-CM | POA: Diagnosis not present

## 2015-08-27 DIAGNOSIS — H2511 Age-related nuclear cataract, right eye: Secondary | ICD-10-CM | POA: Diagnosis not present

## 2015-08-27 DIAGNOSIS — H25811 Combined forms of age-related cataract, right eye: Secondary | ICD-10-CM | POA: Diagnosis not present

## 2015-09-17 DIAGNOSIS — H25812 Combined forms of age-related cataract, left eye: Secondary | ICD-10-CM | POA: Diagnosis not present

## 2015-09-17 DIAGNOSIS — H2512 Age-related nuclear cataract, left eye: Secondary | ICD-10-CM | POA: Diagnosis not present

## 2015-09-28 DIAGNOSIS — Z85828 Personal history of other malignant neoplasm of skin: Secondary | ICD-10-CM | POA: Diagnosis not present

## 2015-09-28 DIAGNOSIS — L570 Actinic keratosis: Secondary | ICD-10-CM | POA: Diagnosis not present

## 2015-09-28 DIAGNOSIS — D692 Other nonthrombocytopenic purpura: Secondary | ICD-10-CM | POA: Diagnosis not present

## 2015-09-28 DIAGNOSIS — L738 Other specified follicular disorders: Secondary | ICD-10-CM | POA: Diagnosis not present

## 2015-10-13 DIAGNOSIS — D2371 Other benign neoplasm of skin of right lower limb, including hip: Secondary | ICD-10-CM | POA: Diagnosis not present

## 2015-10-13 DIAGNOSIS — M79671 Pain in right foot: Secondary | ICD-10-CM | POA: Diagnosis not present

## 2015-11-11 DIAGNOSIS — I87323 Chronic venous hypertension (idiopathic) with inflammation of bilateral lower extremity: Secondary | ICD-10-CM | POA: Diagnosis not present

## 2015-11-12 DIAGNOSIS — I8312 Varicose veins of left lower extremity with inflammation: Secondary | ICD-10-CM | POA: Diagnosis not present

## 2015-11-12 DIAGNOSIS — M79605 Pain in left leg: Secondary | ICD-10-CM | POA: Diagnosis not present

## 2015-11-12 DIAGNOSIS — M79604 Pain in right leg: Secondary | ICD-10-CM | POA: Diagnosis not present

## 2015-11-12 DIAGNOSIS — I8311 Varicose veins of right lower extremity with inflammation: Secondary | ICD-10-CM | POA: Diagnosis not present

## 2015-11-25 DIAGNOSIS — I8312 Varicose veins of left lower extremity with inflammation: Secondary | ICD-10-CM | POA: Diagnosis not present

## 2015-11-25 DIAGNOSIS — R600 Localized edema: Secondary | ICD-10-CM | POA: Diagnosis not present

## 2015-11-26 DIAGNOSIS — Z6827 Body mass index (BMI) 27.0-27.9, adult: Secondary | ICD-10-CM | POA: Diagnosis not present

## 2015-11-26 DIAGNOSIS — M4806 Spinal stenosis, lumbar region: Secondary | ICD-10-CM | POA: Diagnosis not present

## 2015-11-27 DIAGNOSIS — Z800 Family history of malignant neoplasm of digestive organs: Secondary | ICD-10-CM | POA: Diagnosis not present

## 2015-11-27 DIAGNOSIS — R198 Other specified symptoms and signs involving the digestive system and abdomen: Secondary | ICD-10-CM | POA: Diagnosis not present

## 2015-11-27 DIAGNOSIS — R1013 Epigastric pain: Secondary | ICD-10-CM | POA: Diagnosis not present

## 2015-11-27 DIAGNOSIS — R140 Abdominal distension (gaseous): Secondary | ICD-10-CM | POA: Diagnosis not present

## 2015-11-30 DIAGNOSIS — Z6827 Body mass index (BMI) 27.0-27.9, adult: Secondary | ICD-10-CM | POA: Diagnosis not present

## 2015-12-03 DIAGNOSIS — M4806 Spinal stenosis, lumbar region: Secondary | ICD-10-CM | POA: Diagnosis not present

## 2015-12-08 DIAGNOSIS — Z01419 Encounter for gynecological examination (general) (routine) without abnormal findings: Secondary | ICD-10-CM | POA: Diagnosis not present

## 2015-12-08 DIAGNOSIS — Z124 Encounter for screening for malignant neoplasm of cervix: Secondary | ICD-10-CM | POA: Diagnosis not present

## 2015-12-08 DIAGNOSIS — Z1231 Encounter for screening mammogram for malignant neoplasm of breast: Secondary | ICD-10-CM | POA: Diagnosis not present

## 2015-12-08 DIAGNOSIS — I8312 Varicose veins of left lower extremity with inflammation: Secondary | ICD-10-CM | POA: Diagnosis not present

## 2015-12-10 DIAGNOSIS — M5416 Radiculopathy, lumbar region: Secondary | ICD-10-CM | POA: Diagnosis not present

## 2015-12-10 DIAGNOSIS — Z6827 Body mass index (BMI) 27.0-27.9, adult: Secondary | ICD-10-CM | POA: Diagnosis not present

## 2015-12-10 DIAGNOSIS — M4806 Spinal stenosis, lumbar region: Secondary | ICD-10-CM | POA: Diagnosis not present

## 2015-12-11 ENCOUNTER — Other Ambulatory Visit: Payer: Self-pay | Admitting: Neurological Surgery

## 2015-12-21 ENCOUNTER — Encounter (HOSPITAL_COMMUNITY): Payer: Self-pay

## 2015-12-21 ENCOUNTER — Encounter (HOSPITAL_COMMUNITY)
Admission: RE | Admit: 2015-12-21 | Discharge: 2015-12-21 | Disposition: A | Discharge status: Home / Self Care | Payer: Medicare Other | Source: Ambulatory Visit | Attending: Neurological Surgery | Admitting: Neurological Surgery

## 2015-12-21 ENCOUNTER — Other Ambulatory Visit (HOSPITAL_COMMUNITY): Payer: Medicare Other

## 2015-12-21 DIAGNOSIS — Z79899 Other long term (current) drug therapy: Secondary | ICD-10-CM | POA: Insufficient documentation

## 2015-12-21 DIAGNOSIS — Z01818 Encounter for other preprocedural examination: Secondary | ICD-10-CM | POA: Diagnosis not present

## 2015-12-21 DIAGNOSIS — K219 Gastro-esophageal reflux disease without esophagitis: Secondary | ICD-10-CM | POA: Insufficient documentation

## 2015-12-21 DIAGNOSIS — Z96653 Presence of artificial knee joint, bilateral: Secondary | ICD-10-CM | POA: Diagnosis not present

## 2015-12-21 DIAGNOSIS — Z981 Arthrodesis status: Secondary | ICD-10-CM | POA: Diagnosis not present

## 2015-12-21 DIAGNOSIS — I10 Essential (primary) hypertension: Secondary | ICD-10-CM | POA: Diagnosis not present

## 2015-12-21 DIAGNOSIS — G2581 Restless legs syndrome: Secondary | ICD-10-CM | POA: Diagnosis not present

## 2015-12-21 DIAGNOSIS — Z01812 Encounter for preprocedural laboratory examination: Secondary | ICD-10-CM | POA: Diagnosis not present

## 2015-12-21 DIAGNOSIS — M4806 Spinal stenosis, lumbar region: Secondary | ICD-10-CM | POA: Diagnosis not present

## 2015-12-21 DIAGNOSIS — Z7982 Long term (current) use of aspirin: Secondary | ICD-10-CM | POA: Diagnosis not present

## 2015-12-21 DIAGNOSIS — E039 Hypothyroidism, unspecified: Secondary | ICD-10-CM | POA: Diagnosis not present

## 2015-12-21 HISTORY — DX: Other complications of anesthesia, initial encounter: T88.59XA

## 2015-12-21 HISTORY — DX: Adverse effect of unspecified anesthetic, initial encounter: T41.45XA

## 2015-12-21 HISTORY — DX: Unspecified osteoarthritis, unspecified site: M19.90

## 2015-12-21 HISTORY — DX: Restless legs syndrome: G25.81

## 2015-12-21 HISTORY — DX: Personal history of other diseases of the digestive system: Z87.19

## 2015-12-21 HISTORY — DX: Gastro-esophageal reflux disease without esophagitis: K21.9

## 2015-12-21 LAB — BASIC METABOLIC PANEL
Anion gap: 7 (ref 5–15)
BUN: 17 mg/dL (ref 6–20)
CO2: 29 mmol/L (ref 22–32)
Calcium: 9.7 mg/dL (ref 8.9–10.3)
Chloride: 102 mmol/L (ref 101–111)
Creatinine, Ser: 1.34 mg/dL — ABNORMAL HIGH (ref 0.44–1.00)
GFR calc Af Amer: 42 mL/min — ABNORMAL LOW (ref >60)
GFR calc non Af Amer: 37 mL/min — ABNORMAL LOW (ref >60)
Glucose, Bld: 93 mg/dL (ref 65–99)
Potassium: 4.1 mmol/L (ref 3.5–5.1)
Sodium: 138 mmol/L (ref 135–145)

## 2015-12-21 LAB — CBC
HCT: 42.1 % (ref 36.0–46.0)
Hemoglobin: 13.6 g/dL (ref 12.0–15.0)
MCH: 31 pg (ref 26.0–34.0)
MCHC: 32.3 g/dL (ref 30.0–36.0)
MCV: 95.9 fL (ref 78.0–100.0)
Platelets: 319 10*3/uL (ref 150–400)
RBC: 4.39 MIL/uL (ref 3.87–5.11)
RDW: 13.2 % (ref 11.5–15.5)
WBC: 5.1 10*3/uL (ref 4.0–10.5)

## 2015-12-21 LAB — SURGICAL PCR SCREEN
MRSA, PCR: NEGATIVE
Staphylococcus aureus: POSITIVE — AB

## 2015-12-21 MED ORDER — CHLORHEXIDINE GLUCONATE CLOTH 2 % EX PADS: 6.0000 | MEDICATED_PAD | Freq: Once | CUTANEOUS | Status: DC

## 2015-12-21 NOTE — Progress Notes (Signed)
PCP is Burnard Bunting  Cardiologist is Daneen Schick. Patient stated she last saw Dr. Tamala Julian in October 2016.  EKG in [facility] on 03/18/15  Stress test in [facility] on 03/30/15  Nurse stated "I had some weakness in my legs that's why Dr. Tamala Julian did a stress test on me." Patient denied having a cardiac cath, sleep study, or having any acute cardiac or pulmonary issues.

## 2015-12-21 NOTE — Pre-Procedure Instructions (Signed)
[name]  12/21/2015     Your procedure is scheduled on : Tuesday December 29, 2015 at 2:45 PM.  Report to [facility] Admitting at 11:45 AM.  Call this number if you have problems the morning of surgery: [phone]    Remember:  Do not eat food or drink liquids after midnight.  Take these medicines the morning of surgery with A SIP OF WATER : Librax, Dexlansoprazole (Dexilant), Endocet if needed, Escitalopram (Lexapro), Levothyroxine (Synthroid), Metoprolol (Toprol XL), Norvasc, Pantoprazole (Protonix)   Stop taking any vitamins, herbal medications/supplements, NSAIDs, Ibuprofen, Advil, Motrin, Aleve, etc on Tuesday July 25th   Do not wear jewelry, make-up or nail polish.  Do not wear lotions, powders, or perfumes.    Do not shave 48 hours prior to surgery.    Do not bring valuables to the hospital.  [facility] is not responsible for any belongings or valuables.  Contacts, dentures or bridgework may not be worn into surgery.  Leave your suitcase in the car.  After surgery it may be brought to your room.  For patients admitted to the hospital, discharge time will be determined by your treatment team.  Patients discharged the day of surgery will not be allowed to drive home.   Name and phone number of your driver:    Special instructions:  Shower using CHG soap the night before and the morning of your surgery  Please read over the following fact sheets that you were given. MRSA Information

## 2015-12-21 NOTE — Progress Notes (Signed)
Mupirocin Ointment Rx called into [facility] in [facility] for positive PCR of Staph. Left message on pt's voicemail informing her of results and need to pick up Rx.

## 2015-12-22 DIAGNOSIS — M79671 Pain in right foot: Secondary | ICD-10-CM | POA: Diagnosis not present

## 2015-12-22 DIAGNOSIS — D2371 Other benign neoplasm of skin of right lower limb, including hip: Secondary | ICD-10-CM | POA: Diagnosis not present

## 2015-12-23 NOTE — Progress Notes (Addendum)
Anesthesia chart review: Patient is a 82 year old female scheduled for L3-4 laminectomy with Coflex on 12/29/15 by Dr. Ellene Route.  History includes nonsmoker, hypertension, hypothyroidism, osteoporosis, varicose veins, GERD, hiatal hernia, restless leg syndrome, hysterectomy, cholecystectomy, spinal fusion, bilateral TKR. She had evidence of non-obstructive CAD on coronary CTA in 2008. She reported that after her kyphoplasty she had to stay overnight due to "elevated blood pressure" (given Morphine).   PCP is Dr. Burnard Bunting. Cardiologist is Dr. Daneen Schick.  Meds include Xanax, Ambien CR, aspirin 81 mg, Librax, Crestor, Dexilant, Valium, Endocet, Lexapro, Synthroid, magnesium, Toprol-XL, Norvasc, Protonix, Carafate, Maxide 25.  PAT Vitals: BP 115/61, HR 82, RR 20, T 36.6C, O2 sat 95%. BMI 27.  03/18/15 EKG: NSR, septal infarct (age undetermined).  03/30/15 Nuclear stress test:  Nuclear stress EF: 83%.  There was no ST segment deviation noted during stress.  The study is normal. No evidence of ischemia.  This is a low risk study.  10/04/06 CTA Coronary: IMPRESSION: 1.  This study demonstrates basically normal coronary arteries for this patient's age.  The left main is widely patent.  The left anterior descending distributes around the left ventricular apex and is widely patent with < 30% obstruction from soft plaque and one fleck of calcium in the proximal vessel.  The circumflex and right coronaries are also normal.  2.  Normal left ventricular function.  3.  Prominent but not dilated or aneurysmal aorta.  4.   Calcium score of 1.0, suggesting a very low risk for future cardiac events. 5.  Please see the addendum for extracardiac findings.  Preoperative labs noted.   She had recent stress test. Labs appear acceptable. If no acute changes then I anticipate that she can proceed as planned.  George Hugh [facility] Short Stay Center/Anesthesiology Phone [phone] 12/23/2015  7:49 PM

## 2015-12-28 DIAGNOSIS — S8981XA Other specified injuries of right lower leg, initial encounter: Secondary | ICD-10-CM | POA: Diagnosis not present

## 2015-12-29 ENCOUNTER — Encounter (HOSPITAL_COMMUNITY): Payer: Self-pay | Admitting: *Deleted

## 2015-12-29 ENCOUNTER — Encounter (HOSPITAL_COMMUNITY)
Admission: RE | Disposition: A | Discharge status: Home / Self Care | Payer: Self-pay | Source: Ambulatory Visit | Attending: Neurological Surgery

## 2015-12-29 ENCOUNTER — Ambulatory Visit (HOSPITAL_COMMUNITY): Payer: Medicare Other

## 2015-12-29 ENCOUNTER — Inpatient Hospital Stay (HOSPITAL_COMMUNITY)
Admission: RE | Admit: 2015-12-29 | Discharge: 2015-12-31 | DRG: 517 | Disposition: A | Discharge status: Home / Self Care | Payer: Medicare Other | Source: Ambulatory Visit | Attending: Neurological Surgery | Admitting: Neurological Surgery

## 2015-12-29 ENCOUNTER — Ambulatory Visit (HOSPITAL_COMMUNITY): Payer: Medicare Other | Admitting: Vascular Surgery

## 2015-12-29 ENCOUNTER — Ambulatory Visit (HOSPITAL_COMMUNITY): Payer: Medicare Other | Admitting: Anesthesiology

## 2015-12-29 DIAGNOSIS — M1990 Unspecified osteoarthritis, unspecified site: Secondary | ICD-10-CM | POA: Diagnosis not present

## 2015-12-29 DIAGNOSIS — M810 Age-related osteoporosis without current pathological fracture: Secondary | ICD-10-CM | POA: Diagnosis not present

## 2015-12-29 DIAGNOSIS — M4806 Spinal stenosis, lumbar region: Secondary | ICD-10-CM | POA: Diagnosis not present

## 2015-12-29 DIAGNOSIS — M549 Dorsalgia, unspecified: Secondary | ICD-10-CM

## 2015-12-29 DIAGNOSIS — I10 Essential (primary) hypertension: Secondary | ICD-10-CM | POA: Diagnosis present

## 2015-12-29 DIAGNOSIS — K219 Gastro-esophageal reflux disease without esophagitis: Secondary | ICD-10-CM | POA: Diagnosis not present

## 2015-12-29 DIAGNOSIS — Z981 Arthrodesis status: Secondary | ICD-10-CM

## 2015-12-29 DIAGNOSIS — Z96653 Presence of artificial knee joint, bilateral: Secondary | ICD-10-CM | POA: Diagnosis present

## 2015-12-29 DIAGNOSIS — E039 Hypothyroidism, unspecified: Secondary | ICD-10-CM | POA: Diagnosis present

## 2015-12-29 DIAGNOSIS — M545 Low back pain: Secondary | ICD-10-CM

## 2015-12-29 DIAGNOSIS — M5416 Radiculopathy, lumbar region: Secondary | ICD-10-CM | POA: Diagnosis present

## 2015-12-29 DIAGNOSIS — G2581 Restless legs syndrome: Secondary | ICD-10-CM | POA: Diagnosis not present

## 2015-12-29 DIAGNOSIS — M48062 Spinal stenosis, lumbar region with neurogenic claudication: Secondary | ICD-10-CM | POA: Diagnosis present

## 2015-12-29 DIAGNOSIS — Z419 Encounter for procedure for purposes other than remedying health state, unspecified: Secondary | ICD-10-CM

## 2015-12-29 DIAGNOSIS — M79605 Pain in left leg: Secondary | ICD-10-CM | POA: Diagnosis present

## 2015-12-29 HISTORY — PX: LUMBAR LAMINECTOMY WITH COFLEX 1 LEVEL: SHX6514

## 2015-12-29 SURGERY — LUMBAR LAMINECTOMY WITH COFLEX 1 LEVEL
Anesthesia: General | Site: Back

## 2015-12-29 MED ORDER — CEFAZOLIN SODIUM-DEXTROSE 2-4 GM/100ML-% IV SOLN
INTRAVENOUS | Status: AC
  Filled 2015-12-29: qty 100

## 2015-12-29 MED ORDER — SODIUM CHLORIDE 0.9% FLUSH: 3.0000 mL | INTRAVENOUS | Status: DC | PRN

## 2015-12-29 MED ORDER — KETOROLAC TROMETHAMINE 15 MG/ML IJ SOLN
INTRAMUSCULAR | Status: AC
  Filled 2015-12-29: qty 1

## 2015-12-29 MED ORDER — HYDROCODONE-ACETAMINOPHEN 5-325 MG PO TABS
1.0000 | ORAL_TABLET | ORAL | Status: DC | PRN
  Filled 2015-12-29: qty 2

## 2015-12-29 MED ORDER — FENTANYL CITRATE (PF) 250 MCG/5ML IJ SOLN
INTRAMUSCULAR | Status: AC
  Filled 2015-12-29: qty 5

## 2015-12-29 MED ORDER — SUGAMMADEX SODIUM 200 MG/2ML IV SOLN
INTRAVENOUS | Status: AC
  Filled 2015-12-29: qty 2

## 2015-12-29 MED ORDER — ESCITALOPRAM OXALATE 10 MG PO TABS
10.0000 mg | ORAL_TABLET | Freq: Every day | ORAL | Status: DC
  Administered 2015-12-30 – 2015-12-31 (×2): 10 mg via ORAL
  Filled 2015-12-29 (×2): qty 1

## 2015-12-29 MED ORDER — LACTATED RINGERS IV SOLN: INTRAVENOUS | Status: DC

## 2015-12-29 MED ORDER — HYDROMORPHONE HCL 1 MG/ML IJ SOLN
INTRAMUSCULAR | Status: AC
  Administered 2015-12-29: 0.25 mg via INTRAVENOUS
  Filled 2015-12-29: qty 1

## 2015-12-29 MED ORDER — PHENOL 1.4 % MT LIQD
1.0000 | OROMUCOSAL | Status: DC | PRN
Start: 2015-12-29 — End: 2015-12-31

## 2015-12-29 MED ORDER — OXYCODONE-ACETAMINOPHEN 5-325 MG PO TABS
1.0000 | ORAL_TABLET | ORAL | Status: DC | PRN
  Administered 2015-12-29 – 2015-12-31 (×6): 1 via ORAL
  Filled 2015-12-29 (×6): qty 1

## 2015-12-29 MED ORDER — FENTANYL CITRATE (PF) 100 MCG/2ML IJ SOLN
INTRAMUSCULAR | Status: DC | PRN
  Administered 2015-12-29 (×2): 50 ug via INTRAVENOUS
  Administered 2015-12-29: 100 ug via INTRAVENOUS

## 2015-12-29 MED ORDER — LACTATED RINGERS IV SOLN
INTRAVENOUS | Status: DC
  Administered 2015-12-29: 12:00:00 via INTRAVENOUS

## 2015-12-29 MED ORDER — AMLODIPINE BESYLATE 5 MG PO TABS
5.0000 mg | ORAL_TABLET | Freq: Every day | ORAL | Status: DC
  Administered 2015-12-30 – 2015-12-31 (×2): 5 mg via ORAL
  Filled 2015-12-29 (×2): qty 1

## 2015-12-29 MED ORDER — LEVOTHYROXINE SODIUM 25 MCG PO TABS
25.0000 ug | ORAL_TABLET | Freq: Every day | ORAL | Status: DC
  Administered 2015-12-30 – 2015-12-31 (×2): 25 ug via ORAL
  Filled 2015-12-29 (×2): qty 1

## 2015-12-29 MED ORDER — LIDOCAINE HCL (CARDIAC) 20 MG/ML IV SOLN
INTRAVENOUS | Status: DC | PRN
  Administered 2015-12-29: 60 mg via INTRAVENOUS

## 2015-12-29 MED ORDER — DOCUSATE SODIUM 100 MG PO CAPS
100.0000 mg | ORAL_CAPSULE | Freq: Two times a day (BID) | ORAL | Status: DC
  Administered 2015-12-29 – 2015-12-31 (×4): 100 mg via ORAL
  Filled 2015-12-29 (×4): qty 1

## 2015-12-29 MED ORDER — SENNA 8.6 MG PO TABS
1.0000 | ORAL_TABLET | Freq: Two times a day (BID) | ORAL | Status: DC
  Administered 2015-12-29 – 2015-12-31 (×4): 8.6 mg via ORAL
  Filled 2015-12-29 (×4): qty 1

## 2015-12-29 MED ORDER — MORPHINE SULFATE (PF) 2 MG/ML IV SOLN: 1.0000 mg | INTRAVENOUS | Status: DC | PRN

## 2015-12-29 MED ORDER — BUPIVACAINE HCL (PF) 0.5 % IJ SOLN
INTRAMUSCULAR | Status: DC | PRN
  Administered 2015-12-29: 10 mL
  Administered 2015-12-29: 20 mL

## 2015-12-29 MED ORDER — PROPOFOL 10 MG/ML IV BOLUS
INTRAVENOUS | Status: DC | PRN
  Administered 2015-12-29: 30 mg via INTRAVENOUS
  Administered 2015-12-29: 100 mg via INTRAVENOUS

## 2015-12-29 MED ORDER — HYDROMORPHONE HCL 1 MG/ML IJ SOLN
0.2500 mg | INTRAMUSCULAR | Status: DC | PRN
  Administered 2015-12-29 (×2): 0.25 mg via INTRAVENOUS
  Administered 2015-12-29: 0.5 mg via INTRAVENOUS

## 2015-12-29 MED ORDER — HEMOSTATIC AGENTS (NO CHARGE) OPTIME
TOPICAL | Status: DC | PRN
  Administered 2015-12-29: 1 via TOPICAL

## 2015-12-29 MED ORDER — MENTHOL 3 MG MT LOZG
1.0000 | LOZENGE | OROMUCOSAL | Status: DC | PRN
Start: 2015-12-29 — End: 2015-12-31
  Filled 2015-12-29: qty 9

## 2015-12-29 MED ORDER — 0.9 % SODIUM CHLORIDE (POUR BTL) OPTIME
TOPICAL | Status: DC | PRN
  Administered 2015-12-29: 1000 mL

## 2015-12-29 MED ORDER — METOPROLOL SUCCINATE ER 25 MG PO TB24
25.0000 mg | ORAL_TABLET | Freq: Every day | ORAL | Status: DC
  Administered 2015-12-30 – 2015-12-31 (×2): 25 mg via ORAL
  Filled 2015-12-29 (×2): qty 1

## 2015-12-29 MED ORDER — SODIUM CHLORIDE 0.9% FLUSH
3.0000 mL | Freq: Two times a day (BID) | INTRAVENOUS | Status: DC
  Administered 2015-12-29 – 2015-12-30 (×2): 3 mL via INTRAVENOUS

## 2015-12-29 MED ORDER — SUGAMMADEX SODIUM 200 MG/2ML IV SOLN
INTRAVENOUS | Status: DC | PRN
  Administered 2015-12-29: 150 mg via INTRAVENOUS

## 2015-12-29 MED ORDER — ALUM & MAG HYDROXIDE-SIMETH 200-200-20 MG/5ML PO SUSP: 30.0000 mL | Freq: Four times a day (QID) | ORAL | Status: DC | PRN

## 2015-12-29 MED ORDER — PANTOPRAZOLE SODIUM 40 MG PO TBEC: 40.0000 mg | DELAYED_RELEASE_TABLET | Freq: Every day | ORAL | Status: DC | PRN

## 2015-12-29 MED ORDER — THROMBIN 5000 UNITS EX SOLR
CUTANEOUS | Status: DC | PRN
  Administered 2015-12-29 (×2): 5000 [IU] via TOPICAL

## 2015-12-29 MED ORDER — ONDANSETRON HCL 4 MG/2ML IJ SOLN
INTRAMUSCULAR | Status: DC | PRN
  Administered 2015-12-29: 4 mg via INTRAVENOUS

## 2015-12-29 MED ORDER — PROPOFOL 10 MG/ML IV BOLUS
INTRAVENOUS | Status: AC
  Filled 2015-12-29: qty 20

## 2015-12-29 MED ORDER — TRIAMTERENE-HCTZ 37.5-25 MG PO TABS
1.0000 | ORAL_TABLET | Freq: Every day | ORAL | Status: DC
  Administered 2015-12-29 – 2015-12-31 (×3): 1 via ORAL
  Filled 2015-12-29 (×3): qty 1

## 2015-12-29 MED ORDER — ALPRAZOLAM 0.5 MG PO TABS
0.5000 mg | ORAL_TABLET | Freq: Every evening | ORAL | Status: DC | PRN
  Administered 2015-12-29 – 2015-12-30 (×2): 0.5 mg via ORAL
  Filled 2015-12-29 (×2): qty 1

## 2015-12-29 MED ORDER — PHENYLEPHRINE HCL 10 MG/ML IJ SOLN
INTRAMUSCULAR | Status: DC | PRN
  Administered 2015-12-29: 40 ug/min via INTRAVENOUS

## 2015-12-29 MED ORDER — LIDOCAINE 2% (20 MG/ML) 5 ML SYRINGE
INTRAMUSCULAR | Status: AC
  Filled 2015-12-29: qty 5

## 2015-12-29 MED ORDER — ROCURONIUM BROMIDE 50 MG/5ML IV SOLN
INTRAVENOUS | Status: AC
  Filled 2015-12-29: qty 1

## 2015-12-29 MED ORDER — ROSUVASTATIN CALCIUM 5 MG PO TABS
5.0000 mg | ORAL_TABLET | Freq: Every day | ORAL | Status: DC
  Administered 2015-12-30: 5 mg via ORAL
  Filled 2015-12-29 (×2): qty 1

## 2015-12-29 MED ORDER — OXYCODONE-ACETAMINOPHEN 5-325 MG PO TABS: 1.0000 | ORAL_TABLET | ORAL | Status: DC | PRN

## 2015-12-29 MED ORDER — KETOROLAC TROMETHAMINE 15 MG/ML IJ SOLN
15.0000 mg | Freq: Four times a day (QID) | INTRAMUSCULAR | Status: AC
Start: 2015-12-29 — End: 2015-12-30
  Administered 2015-12-29 – 2015-12-30 (×4): 15 mg via INTRAVENOUS
  Filled 2015-12-29 (×4): qty 1

## 2015-12-29 MED ORDER — SODIUM CHLORIDE 0.9 % IV SOLN: 250.0000 mL | INTRAVENOUS | Status: DC

## 2015-12-29 MED ORDER — SODIUM CHLORIDE 0.9 % IR SOLN
Status: DC | PRN
  Administered 2015-12-29: 15:00:00

## 2015-12-29 MED ORDER — METHYLCELLULOSE 1 % OP SOLN: 1.0000 [drp] | Freq: Every day | OPHTHALMIC | Status: DC | PRN

## 2015-12-29 MED ORDER — OXYCODONE-ACETAMINOPHEN 5-325 MG PO TABS: 1.0000 | ORAL_TABLET | Freq: Two times a day (BID) | ORAL | Status: DC | PRN

## 2015-12-29 MED ORDER — ACETAMINOPHEN 325 MG PO TABS: 650.0000 mg | ORAL_TABLET | ORAL | Status: DC | PRN

## 2015-12-29 MED ORDER — ROCURONIUM BROMIDE 100 MG/10ML IV SOLN
INTRAVENOUS | Status: DC | PRN
  Administered 2015-12-29: 50 mg via INTRAVENOUS

## 2015-12-29 MED ORDER — CEFAZOLIN SODIUM-DEXTROSE 2-4 GM/100ML-% IV SOLN
2.0000 g | INTRAVENOUS | Status: AC
  Administered 2015-12-29: 2 g via INTRAVENOUS

## 2015-12-29 MED ORDER — ONDANSETRON HCL 4 MG/2ML IJ SOLN
4.0000 mg | INTRAMUSCULAR | Status: DC | PRN
Start: 2015-12-29 — End: 2015-12-31

## 2015-12-29 MED ORDER — ACETAMINOPHEN 650 MG RE SUPP: 650.0000 mg | RECTAL | Status: DC | PRN

## 2015-12-29 MED ORDER — LIDOCAINE-EPINEPHRINE 1 %-1:100000 IJ SOLN
INTRAMUSCULAR | Status: DC | PRN
  Administered 2015-12-29: 10 mL

## 2015-12-29 MED ORDER — LACTATED RINGERS IV SOLN
INTRAVENOUS | Status: DC | PRN
  Administered 2015-12-29 (×2): via INTRAVENOUS

## 2015-12-29 MED ORDER — DIAZEPAM 5 MG PO TABS: 5.0000 mg | ORAL_TABLET | ORAL | Status: DC

## 2015-12-29 MED ORDER — SUCRALFATE 1 G PO TABS
1.0000 g | ORAL_TABLET | Freq: Two times a day (BID) | ORAL | Status: DC
  Administered 2015-12-30 – 2015-12-31 (×3): 1 g via ORAL
  Filled 2015-12-29 (×4): qty 1

## 2015-12-29 MED ORDER — THROMBIN 5000 UNITS EX SOLR
OROMUCOSAL | Status: DC | PRN
  Administered 2015-12-29: 14:00:00 via TOPICAL

## 2015-12-29 MED ORDER — POLYETHYLENE GLYCOL 3350 17 G PO PACK: 17.0000 g | PACK | Freq: Every day | ORAL | Status: DC | PRN

## 2015-12-29 MED ORDER — PANTOPRAZOLE SODIUM 40 MG PO TBEC
40.0000 mg | DELAYED_RELEASE_TABLET | Freq: Every day | ORAL | Status: DC
  Administered 2015-12-29 – 2015-12-31 (×3): 40 mg via ORAL
  Filled 2015-12-29 (×3): qty 1

## 2015-12-29 MED ORDER — METHOCARBAMOL 500 MG PO TABS
500.0000 mg | ORAL_TABLET | Freq: Four times a day (QID) | ORAL | Status: DC | PRN
  Administered 2015-12-29 – 2015-12-31 (×2): 500 mg via ORAL
  Filled 2015-12-29 (×2): qty 1

## 2015-12-29 MED ORDER — POLYVINYL ALCOHOL 1.4 % OP SOLN
1.0000 [drp] | Freq: Every day | OPHTHALMIC | Status: DC | PRN
  Administered 2015-12-29: 1 [drp] via OPHTHALMIC
  Filled 2015-12-29: qty 15

## 2015-12-29 MED ORDER — EPHEDRINE 5 MG/ML INJ
INTRAVENOUS | Status: AC
Start: 2015-12-29 — End: 2015-12-29
  Filled 2015-12-29: qty 10

## 2015-12-29 MED ORDER — ONDANSETRON HCL 4 MG/2ML IJ SOLN
INTRAMUSCULAR | Status: AC
  Filled 2015-12-29: qty 2

## 2015-12-29 MED ORDER — CILIDINIUM-CHLORDIAZEPOXIDE 2.5-5 MG PO CAPS
1.0000 | ORAL_CAPSULE | Freq: Two times a day (BID) | ORAL | Status: DC
  Administered 2015-12-29 – 2015-12-31 (×4): 1 via ORAL
  Filled 2015-12-29 (×4): qty 1

## 2015-12-29 MED ORDER — METHOCARBAMOL 1000 MG/10ML IJ SOLN
500.0000 mg | Freq: Four times a day (QID) | INTRAVENOUS | Status: DC | PRN
  Filled 2015-12-29: qty 5

## 2015-12-29 SURGICAL SUPPLY — 55 items
ADH SKN CLS APL DERMABOND .7 (GAUZE/BANDAGES/DRESSINGS) ×1
BAG DECANTER FOR FLEXI CONT (MISCELLANEOUS) ×2 IMPLANT
BLADE CLIPPER SURG (BLADE) IMPLANT
BUR ACORN 6.0 (BURR) IMPLANT
BUR MATCHSTICK NEURO 3.0 LAGG (BURR) ×2 IMPLANT
CANISTER SUCT 3000ML PPV (MISCELLANEOUS) ×2 IMPLANT
DECANTER SPIKE VIAL GLASS SM (MISCELLANEOUS) ×2 IMPLANT
DERMABOND ADVANCED (GAUZE/BANDAGES/DRESSINGS) ×1
DERMABOND ADVANCED .7 DNX12 (GAUZE/BANDAGES/DRESSINGS) ×1 IMPLANT
DEVICE COFLEX STABLIZATION 8MM (Neuro Prosthesis/Implant) ×1 IMPLANT
DEVICE DISSECT PLASMABLAD 3.0S (MISCELLANEOUS) IMPLANT
DRAPE C-ARM 42X72 X-RAY (DRAPES) ×4 IMPLANT
DRAPE HALF SHEET 40X57 (DRAPES) IMPLANT
DRAPE LAPAROTOMY T 102X78X121 (DRAPES) ×2 IMPLANT
DRAPE MICROSCOPE LEICA (MISCELLANEOUS) IMPLANT
DRAPE POUCH INSTRU U-SHP 10X18 (DRAPES) ×2 IMPLANT
DRSG OPSITE POSTOP 4X6 (GAUZE/BANDAGES/DRESSINGS) ×1 IMPLANT
DURAPREP 26ML APPLICATOR (WOUND CARE) ×2 IMPLANT
ELECT REM PT RETURN 9FT ADLT (ELECTROSURGICAL) ×2
ELECTRODE REM PT RTRN 9FT ADLT (ELECTROSURGICAL) ×1 IMPLANT
GAUZE SPONGE 4X4 12PLY STRL (GAUZE/BANDAGES/DRESSINGS) ×2 IMPLANT
GAUZE SPONGE 4X4 16PLY XRAY LF (GAUZE/BANDAGES/DRESSINGS) IMPLANT
GLOVE BIOGEL PI IND STRL 8.5 (GLOVE) ×1 IMPLANT
GLOVE BIOGEL PI INDICATOR 8.5 (GLOVE) ×1
GLOVE ECLIPSE 8.5 STRL (GLOVE) ×2 IMPLANT
GLOVE EXAM NITRILE LRG STRL (GLOVE) IMPLANT
GLOVE EXAM NITRILE MD LF STRL (GLOVE) IMPLANT
GLOVE EXAM NITRILE XL STR (GLOVE) IMPLANT
GLOVE EXAM NITRILE XS STR PU (GLOVE) IMPLANT
GOWN STRL REUS W/ TWL LRG LVL3 (GOWN DISPOSABLE) IMPLANT
GOWN STRL REUS W/ TWL XL LVL3 (GOWN DISPOSABLE) IMPLANT
GOWN STRL REUS W/TWL 2XL LVL3 (GOWN DISPOSABLE) ×2 IMPLANT
GOWN STRL REUS W/TWL LRG LVL3 (GOWN DISPOSABLE)
GOWN STRL REUS W/TWL XL LVL3 (GOWN DISPOSABLE)
HEMOSTAT POWDER SURGIFOAM 1G (HEMOSTASIS) ×1 IMPLANT
KIT BASIN OR (CUSTOM PROCEDURE TRAY) ×2 IMPLANT
KIT ROOM TURNOVER OR (KITS) ×2 IMPLANT
NDL SPNL 20GX3.5 QUINCKE YW (NEEDLE) IMPLANT
NEEDLE HYPO 22GX1.5 SAFETY (NEEDLE) ×2 IMPLANT
NEEDLE SPNL 20GX3.5 QUINCKE YW (NEEDLE) IMPLANT
NS IRRIG 1000ML POUR BTL (IV SOLUTION) ×2 IMPLANT
PACK LAMINECTOMY NEURO (CUSTOM PROCEDURE TRAY) ×2 IMPLANT
PAD ARMBOARD 7.5X6 YLW CONV (MISCELLANEOUS) ×6 IMPLANT
PATTIES SURGICAL .5 X1 (DISPOSABLE) ×2 IMPLANT
PLASMABLADE 3.0S (MISCELLANEOUS) ×2
RUBBERBAND STERILE (MISCELLANEOUS) IMPLANT
SPONGE SURGIFOAM ABS GEL SZ50 (HEMOSTASIS) ×2 IMPLANT
SUT PROLENE 6 0 BV (SUTURE) ×2 IMPLANT
SUT VIC AB 1 CT1 18XBRD ANBCTR (SUTURE) ×1 IMPLANT
SUT VIC AB 1 CT1 8-18 (SUTURE) ×4
SUT VIC AB 2-0 CP2 18 (SUTURE) ×2 IMPLANT
SUT VIC AB 3-0 SH 8-18 (SUTURE) ×2 IMPLANT
TOWEL OR 17X24 6PK STRL BLUE (TOWEL DISPOSABLE) ×2 IMPLANT
TOWEL OR 17X26 10 PK STRL BLUE (TOWEL DISPOSABLE) ×2 IMPLANT
WATER STERILE IRR 1000ML POUR (IV SOLUTION) ×2 IMPLANT

## 2015-12-29 NOTE — Progress Notes (Signed)
Orthopedic Tech Progress Note Patient Details:  [name] [birth year] [id number] Patient was a pre-fit. Patient ID: [name], female   DOB: 1933-08-07, 82 y.o.   MRN: [id number]   Braulio Bosch 12/29/2015, 9:38 PM

## 2015-12-29 NOTE — Op Note (Signed)
Date of surgery: 12/29/2015 Preoperative diagnosis: L3-L4 lumbar spinal stenosis, status post arthrodesis L4-L5. Postoperative diagnosis: Same Procedure: Bilateral laminotomies and foraminotomies L3-L4 placement of Coflex L3-L4. Surgeon: Kristeen Miss First assistant: Dayton Bailiff M.D. Anesthesia: Gen. endotracheal Indications: [name] is a 82 year old individual who has had significant back and bilateral lower extremity pain. She has some weakness in the proximal portions of the leg. She has evidence of a high-grade stenosis at L3-L4. She has had a previous decompression fusion at L4-L5 for degenerative spondylolisthesis. She's been advised regarding surgery to decompress and stabilize L3-L4 using a Coflex device.  Procedure: The patient was brought to the operating room supine on a stretcher. After the smooth induction of general endotracheal anesthesia, she was turned prone. The back was prepped with alcohol DuraPrep and draped in a sterile fashion. A midline incision was made in the area of the previous incision. The dissection was carried down to the lumbar dorsal fascia. The fascia was opened on either side of the midline and the interlaminar space at L3-L4 the first mobile segment above her fusion was identified. A laminotomy was created on the right side at L3-L4 and then a localizing radiograph identified and confirmed the presence of the space. Laminotomy was taken down to the yellow ligament which was taken up and removed this expose the common dural tube. The dissection was carried superiorly and then laterally and inferiorly to expose the common dural tube the path of the L3 nerve root superiorly path of the L4 nerve root inferiorly. Once the decompression was obtained the dissection was carried out on the opposite side. Similar laminotomy was created and the dura was carefully dissected and the yellow ligament which was markedly thickened and redundant was removed from this region. The L3  and the L4 nerve roots were again decompressed on the side. At the end of the dissection was noted to be an area of thickened dura the dorsolateral aspect underneath the lamina of L4. This exceeded some spinal fluid and to control it a 6-0 Prolene suture in a horizontal mattress was placed to imbricate the dura and the seal the leak.  Attention was then turned to the interspinous space in the interspinous ligament this was opened with a bare rongeur and the interspace was then sized. An 8 mm Coflex sizer fit well with some distraction of the facet joints. An 8 mm Coflex was then placed between the spinous processes of L3 and L4. This was seated just above the dura. The wings were then approximated to the spinous process itself. Once completely placed final radiographs were obtained in AP and lateral projection and these identified good placement of the Coflex. With this the wound was irrigated copiously the dural repair was checked and noted to be intact the hemostasis and the wound was checked and when this was felt to be good the lumbar dorsal fascia was closed with #1 Vicryl in interrupted fashion and 2-0 Vicryl was used in the subcutaneous tissues. 3-0 Vicryl was used to close the subcutaneous take her skin. Dermabond was placed on the skin. Blood loss for the procedure was estimated at 100 mL. Patient was returned to the recovery room in stable condition.

## 2015-12-29 NOTE — Anesthesia Procedure Notes (Signed)
Procedure Name: Intubation Date/Time: 12/29/2015 2:15 PM Performed by: Izora Gala Pre-anesthesia Checklist: Patient identified, Emergency Drugs available, Suction available and Patient being monitored Patient Re-evaluated:Patient Re-evaluated prior to inductionOxygen Delivery Method: Circle system utilized Preoxygenation: Pre-oxygenation with 100% oxygen Intubation Type: IV induction Ventilation: Mask ventilation without difficulty Laryngoscope Size: Miller and 3 Grade View: Grade I Tube type: Oral Tube size: 7.5 mm Number of attempts: 1 Airway Equipment and Method: Stylet and Bite block Placement Confirmation: ETT inserted through vocal cords under direct vision,  positive ETCO2 and breath sounds checked- equal and bilateral Secured at: 21 cm Tube secured with: Tape Dental Injury: Teeth and Oropharynx as per pre-operative assessment

## 2015-12-29 NOTE — H&P (Signed)
[name] is an 82 y.o. female.   Chief Complaint: Progressive back and bilateral leg pain HPI: The Basgall is a 82 year old individual whom I've taken care of over a number of years. She is had a degenerative spondylolisthesis at L4-L5 that underwent decompression and fusion a number of years ago. She has had intermittent layers of back pain that responded well to an occasional epidural steroid injection however in the past year she has become increasingly refractory to repeated trials at this treatment. She notes that her tolerance to being on her feet is decreased substantially such that she cannot do her gardening in her household chores. A recent MRI demonstrates that she is developing adjacent level stenosis at L3-L4. She's been advised regarding surgical decompression of this process with placement of a Coflex device. She is now admitted for that surgery.  Past Medical History:  Diagnosis Date  . Arthritis   . Complication of anesthesia    Elevated blood pressure after having last Kyphoplasty; pt stated "I was given Morphine and had to stay overnight"  . DI (detrusor instability)   . Fracture of vertebra    x 3  . GERD (gastroesophageal reflux disease)   . History of hiatal hernia   . Hypertension   . Hypothyroidism   . Menopausal symptoms   . Osteoporosis   . Restless leg syndrome   . Varicose veins     Past Surgical History:  Procedure Laterality Date  . ABDOMINAL HYSTERECTOMY  1992   TAH,BSO  . BLADDER SUSPENSION  2011   CRYOMESH  . CARPAL TUNNEL RELEASE Bilateral 1982  . CHOLECYSTECTOMY  1992  . COLONOSCOPY    . EYE SURGERY Bilateral    Cataract removal  . KYPHOPLASTY     X 3  . OOPHORECTOMY     BSO  . REPLACEMENT TOTAL KNEE  2008,  2011   RIGHT 2008, LEFT 2011  . SPINAL FUSION  2011  . VERTEBRAL SURGERY     T-8, T-10. T-12  DR Roselee Culver    Family History  Problem Relation Age of Onset  . Heart disease Mother   . Heart disease Father   . Stroke Father   .  Hypertension Sister   . Diabetes Sister   . Cancer Sister     OVARIAN CA  . Hypertension Brother   . Heart disease Brother   . Diabetes Sister   . Cancer Sister     Colon   Social History:  reports that she has never smoked. She has never used smokeless tobacco. She reports that she does not drink alcohol or use drugs.  Allergies: No Known Allergies  No prescriptions prior to admission.    No results found for this or any previous visit (from the past 48 hour(s)). No results found.  Review of Systems  HENT: Negative.   Eyes: Negative.   Respiratory: Negative.   Cardiovascular: Negative.   Gastrointestinal: Negative.   Genitourinary: Negative.   Musculoskeletal: Positive for back pain.  Neurological: Positive for focal weakness and weakness.  Endo/Heme/Allergies: Negative.   Psychiatric/Behavioral: Negative.     There were no vitals taken for this visit. Physical Exam  Constitutional: She is oriented to person, place, and time. She appears well-developed and well-nourished.  HENT:  Head: Normocephalic and atraumatic.  Eyes: Conjunctivae and EOM are normal. Pupils are equal, round, and reactive to light.  Neck: Normal range of motion. Neck supple.  Cardiovascular: Normal rate and regular rhythm.   Respiratory: Effort normal  and breath sounds normal.  GI: Soft. Bowel sounds are normal.  Musculoskeletal: Normal range of motion.  Neurological: She is alert and oriented to person, place, and time.  Mild weakness in the quadriceps bilaterally for 5 absent patellar reflexes. Absent Achilles reflexes. Straight leg raising positive at 45 for contralateral leg pain. Patrick's maneuver is negative bilaterally. Palpation of the back does not reproduce any overt tenderness.  Skin: Skin is warm and dry.  Psychiatric: She has a normal mood and affect. Her behavior is normal. Judgment and thought content normal.     Assessment/Plan Spondylosis and stenosis L3-L4 status post  arthrodesis L4-L5.  Plan decompression L3-4 with placement of Coflex.  Earleen Newport, MD 12/29/2015, 7:37 AM

## 2015-12-29 NOTE — Progress Notes (Signed)
Patient ID: [name], female   DOB: [birth year], 81 y.o.   MRN: [id number] Vital signs are stable Dressing is clean and dry Motor function is intact in lower extremities Tolerating surgery well postop

## 2015-12-29 NOTE — Anesthesia Preprocedure Evaluation (Addendum)
Anesthesia Evaluation  Patient identified by MRN, date of birth, ID band Patient awake    Reviewed: Allergy & Precautions, H&P , NPO status , Patient's Chart, lab work & pertinent test results  History of Anesthesia Complications (+) history of anesthetic complications  Airway Mallampati: II  TM Distance: >3 FB Neck ROM: full    Dental no notable dental hx. (+) Dental Advisory Given, Teeth Intact   Pulmonary neg pulmonary ROS,    Pulmonary exam normal breath sounds clear to auscultation       Cardiovascular hypertension, Pt. on medications + CAD  Normal cardiovascular exam Rhythm:regular Rate:Normal  Elevated BP after kyphoplasty   Neuro/Psych negative neurological ROS  negative psych ROS   GI/Hepatic negative GI ROS, Neg liver ROS, hiatal hernia, GERD  ,  Endo/Other  negative endocrine ROSHypothyroidism   Renal/GU negative Renal ROS  negative genitourinary   Musculoskeletal   Abdominal   Peds  Hematology negative hematology ROS (+)   Anesthesia Other Findings   Reproductive/Obstetrics negative OB ROS                            Anesthesia Physical Anesthesia Plan  ASA: III  Anesthesia Plan: General   Post-op Pain Management:    Induction: Intravenous  Airway Management Planned: Oral ETT  Additional Equipment:   Intra-op Plan:   Post-operative Plan: Extubation in OR  Informed Consent: I have reviewed the patients History and Physical, chart, labs and discussed the procedure including the risks, benefits and alternatives for the proposed anesthesia with the patient or authorized representative who has indicated his/her understanding and acceptance.   Dental Advisory Given  Plan Discussed with: CRNA  Anesthesia Plan Comments:         Anesthesia Quick Evaluation

## 2015-12-29 NOTE — Transfer of Care (Signed)
Immediate Anesthesia Transfer of Care Note  Patient: [name]  Procedure(s) Performed: Procedure(s) with comments: L3-4 Laminectomy with Coflex (N/A) - L3-4 Laminectomy with coflex  Patient Location: PACU  Anesthesia Type:General  Level of Consciousness: awake, alert  and oriented  Airway & Oxygen Therapy: Patient Spontanous Breathing and Patient connected to face mask oxygen  Post-op Assessment: Report given to RN, Post -op Vital signs reviewed and stable, Patient moving all extremities and Patient moving all extremities X 4  Post vital signs: Reviewed and stable  Last Vitals:  Vitals:   12/29/15 1134  BP: 126/82  Pulse: 99  Resp: 20  Temp: 36.8 C    Last Pain:  Vitals:   12/29/15 1145  PainSc: 5          Complications: No apparent anesthesia complications

## 2015-12-30 ENCOUNTER — Encounter (HOSPITAL_COMMUNITY): Payer: Self-pay | Admitting: Neurological Surgery

## 2015-12-30 MED ORDER — DEXAMETHASONE 4 MG PO TABS
4.0000 mg | ORAL_TABLET | Freq: Every day | ORAL | Status: DC
  Administered 2015-12-30 – 2015-12-31 (×2): 4 mg via ORAL
  Filled 2015-12-30 (×2): qty 1

## 2015-12-30 MED ORDER — ZOLPIDEM TARTRATE 5 MG PO TABS
5.0000 mg | ORAL_TABLET | Freq: Every evening | ORAL | Status: DC | PRN
  Administered 2015-12-30: 5 mg via ORAL
  Filled 2015-12-30: qty 1

## 2015-12-30 NOTE — Anesthesia Postprocedure Evaluation (Signed)
Anesthesia Post Note  Patient: [name]  Procedure(s) Performed: Procedure(s) (LRB): L3-4 Laminectomy with Coflex (N/A)  Patient location during evaluation: PACU Anesthesia Type: General Level of consciousness: awake and alert Pain management: pain level controlled Vital Signs Assessment: post-procedure vital signs reviewed and stable Respiratory status: spontaneous breathing, nonlabored ventilation, respiratory function stable and patient connected to nasal cannula oxygen Cardiovascular status: blood pressure returned to baseline and stable Postop Assessment: no signs of nausea or vomiting Anesthetic complications: no    Last Vitals:  Vitals:   12/30/15 0807 12/30/15 1230  BP: 140/86 113/64  Pulse: 98 92  Resp: 18 18  Temp: 36.4 C 36.9 C    Last Pain:  Vitals:   12/30/15 1230  TempSrc: Oral  PainSc:                  [name]

## 2015-12-30 NOTE — Evaluation (Signed)
Occupational Therapy Evaluation Patient Details Name: [name] MRN: [id number] DOB: 07/13/1933 Today's Date: 12/30/2015    History of Present Illness Patient is a 82 yo female, s/p Bilateral laminotomies and foraminotomies L3-L4 placement of Coflex L3-L4.   Clinical Impression   Pt reports she was independent with ADL PTA. Currently pt is overall supervision for safety with ADL and functional mobility. All back, safety, and ADL education completed with pt; she has no further questions or concerns. Pt planning to d/c home with 24/7 supervision from her husband. No further acute OT needs identified; signing off at this time. Please re-consult if needs change. Thank you for this referral.     Follow Up Recommendations  No OT follow up;Supervision/Assistance - 24 hour    Equipment Recommendations  None recommended by OT    Recommendations for Other Services       Precautions / Restrictions Precautions Precautions: Back Precaution Booklet Issued: Yes (comment) Precaution Comments: Reviewed precautions with pt Required Braces or Orthoses: Spinal Brace Spinal Brace: Lumbar corset Restrictions Weight Bearing Restrictions: No      Mobility Bed Mobility Overal bed mobility: Needs Assistance Bed Mobility: Sidelying to Sit   Sidelying to sit: Supervision       General bed mobility comments: Pt OOB upon arrival.  Transfers Overall transfer level: Needs assistance Equipment used: None Transfers: Sit to/from Stand Sit to Stand: Supervision         General transfer comment: No physical assist required, increased time to perform    Balance Overall balance assessment: No apparent balance deficits (not formally assessed)                                          ADL Overall ADL's : Needs assistance/impaired Eating/Feeding: Modified independent;Sitting   Grooming: Supervision/safety;Wash/dry hands;Standing Grooming Details (indicate cue type and  reason): Educated pt on use of 2 cups for oral care. Upper Body Bathing: Supervision/ safety;Sitting   Lower Body Bathing: Supervison/ safety;Sit to/from stand   Upper Body Dressing : Supervision/safety;Sitting Upper Body Dressing Details (indicate cue type and reason): Pt reports she feels comfortable donning/doffing back brace. No questions or concerns. Lower Body Dressing: Supervision/safety;Sit to/from stand Lower Body Dressing Details (indicate cue type and reason): Pt able to cross foot over opposite knee. Educated pt on use of compensatory strategies for LB ADL. Toilet Transfer: Supervision/safety;Ambulation;BSC   Toileting- Water quality scientist and Hygiene: Supervision/safety;Sit to/from stand Toileting - Clothing Manipulation Details (indicate cue type and reason): Pt able to perform peri care in standing without twising. Educated pt on what she can use for toilet aide if needed upon return home. Tub/ Shower Transfer: Supervision/safety;Tub transfer;Ambulation;Shower Scientist, research (medical) Details (indicate cue type and reason): Simulated tub transfer. Pt with good technique demonstrating safety. Functional mobility during ADLs: Supervision/safety General ADL Comments: Educated pt on maintaining back precautions during functional activities, log roll for bed mobility, pillow between knees when sleeping on side.      Vision Vision Assessment?: No apparent visual deficits   Perception     Praxis      Pertinent Vitals/Pain Pain Assessment: Faces Pain Score: 7  Faces Pain Scale: Hurts even more Pain Location: back Pain Descriptors / Indicators: Sore Pain Intervention(s): Monitored during session;Repositioned     Hand Dominance Right   Extremity/Trunk Assessment Upper Extremity Assessment Upper Extremity Assessment: Overall WFL for tasks assessed   Lower Extremity Assessment  Lower Extremity Assessment: Defer to PT evaluation   Cervical / Trunk Assessment Cervical /  Trunk Assessment: Other exceptions Cervical / Trunk Exceptions: s/p lumbar sx   Communication Communication Communication: No difficulties   Cognition Arousal/Alertness: Awake/alert Behavior During Therapy: WFL for tasks assessed/performed Overall Cognitive Status: Within Functional Limits for tasks assessed                     General Comments       Exercises       Shoulder Instructions      Home Living Family/patient expects to be discharged to:: Private residence Living Arrangements: Spouse/significant other Available Help at Discharge: Family Type of Home: House Home Access: Stairs to enter Technical brewer of Steps: 4 Entrance Stairs-Rails: Right Home Layout: One level (2 steps to den)     Bathroom Shower/Tub: Teacher, early years/pre: Handicapped height     Home Equipment: [place] - single point;Shower seat          Prior Functioning/Environment Level of Independence: Independent             OT Diagnosis: Acute pain   OT Problem List:     OT Treatment/Interventions:      OT Goals(Current goals can be found in the care plan section) Acute Rehab OT Goals Patient Stated Goal: return home OT Goal Formulation: All assessment and education complete, DC therapy  OT Frequency:     Barriers to D/C:            Co-evaluation              End of Session Equipment Utilized During Treatment: Back brace Nurse Communication: Mobility status;Other (comment) (no equipment needs)  Activity Tolerance: Patient tolerated treatment well Patient left: in chair;with call bell/phone within reach   Time: 0810-0822 OT Time Calculation (min): 12 min Charges:  OT General Charges $OT Visit: 1 Procedure OT Evaluation $OT Eval Moderate Complexity: 1 Procedure G-Codes:     Binnie Kand M.S., OTR/L Pager: [phone]  12/30/2015, 9:15 AM

## 2015-12-30 NOTE — Evaluation (Signed)
Physical Therapy Evaluation Patient Details Name: [name] MRN: [id number] DOB: 1933/11/14 Today's Date: 12/30/2015   History of Present Illness  Patient is a 82 yo female, s/p Bilateral laminotomies and foraminotomies L3-L4 placement of Coflex L3-L4.  Clinical Impression  Patient seen for mobility assessment and education s/p spinal surgery. Patient mobilizing well, performed stair negotiation and was receptive to education. At this time recommend d/c home with supervision. No further acute PT needs, will sign off.    Follow Up Recommendations No PT follow up;Supervision - Intermittent    Equipment Recommendations  None recommended by PT    Recommendations for Other Services       Precautions / Restrictions Precautions Precautions: Back Precaution Booklet Issued: Yes (comment) Precaution Comments: verbally reviewed and hand out provided Required Braces or Orthoses: Spinal Brace Spinal Brace: Lumbar corset Restrictions Weight Bearing Restrictions: No      Mobility  Bed Mobility Overal bed mobility: Needs Assistance Bed Mobility: Sidelying to Sit   Sidelying to sit: Supervision       General bed mobility comments: educated on log roll technique  Transfers Overall transfer level: Needs assistance Equipment used: None Transfers: Sit to/from Stand Sit to Stand: Supervision         General transfer comment: No physical assist required, increased time to perform  Ambulation/Gait Ambulation/Gait assistance: Supervision Ambulation Distance (Feet): 350 Feet Assistive device: None Gait Pattern/deviations: Step-through pattern;Drifts right/left;Narrow base of support Gait velocity: decreased Gait velocity interpretation: Below normal speed for age/gender General Gait Details: some instability noted with ambulation, no significant LOB  Stairs Stairs: Yes Stairs assistance: Supervision Stair Management: Forwards Number of Stairs: 12 General stair comments:  patient able to perform step over step technique, no difficulty  Wheelchair Mobility    Modified Rankin (Stroke Patients Only)       Balance Overall balance assessment: No apparent balance deficits (not formally assessed)                                           Pertinent Vitals/Pain Pain Assessment: 0-10 Pain Score: 7  Pain Location: operative site Pain Descriptors / Indicators: Sore Pain Intervention(s): Monitored during session    Home Living Family/patient expects to be discharged to:: Private residence Living Arrangements: Spouse/significant other Available Help at Discharge: Family Type of Home: House Home Access: Stairs to enter Entrance Stairs-Rails: Right Entrance Stairs-Number of Steps: 4 Home Layout: One level (2 steps to [employer]) Home Equipment: Kasandra Knudsen - single point;Shower seat      Prior Function Level of Independence: Independent               Hand Dominance   Dominant Hand: Right    Extremity/Trunk Assessment   Upper Extremity Assessment: Overall WFL for tasks assessed           Lower Extremity Assessment: Overall WFL for tasks assessed         Communication   Communication: No difficulties  Cognition Arousal/Alertness: Awake/alert Behavior During Therapy: WFL for tasks assessed/performed Overall Cognitive Status: Within Functional Limits for tasks assessed                      General Comments      Exercises        Assessment/Plan    PT Assessment Patent does not need any further PT services  PT Diagnosis Difficulty walking;Acute pain  PT Problem List    PT Treatment Interventions     PT Goals (Current goals can be found in the Care Plan section) Acute Rehab PT Goals PT Goal Formulation: All assessment and education complete, DC therapy    Frequency     Barriers to discharge        Co-evaluation               End of Session Equipment Utilized During Treatment: Back  brace Activity Tolerance: Patient tolerated treatment well Patient left: Other (comment) (with OT) Nurse Communication: Mobility status    Functional Assessment Tool Used: clinical judgement Functional Limitation: Mobility: Walking and moving around Mobility: Walking and Moving Around Current Status [id number]): At least 1 percent but less than 20 percent impaired, limited or restricted Mobility: Walking and Moving Around Goal Status [phone]): At least 1 percent but less than 20 percent impaired, limited or restricted Mobility: Walking and Moving Around Discharge Status [phone]): At least 1 percent but less than 20 percent impaired, limited or restricted    Time: 0751-0810 PT Time Calculation (min) (ACUTE ONLY): 19 min   Charges:   PT Evaluation $PT Eval Low Complexity: 1 Procedure     PT G Codes:   PT G-Codes **NOT FOR INPATIENT CLASS** Functional Assessment Tool Used: clinical judgement Functional Limitation: Mobility: Walking and moving around Mobility: Walking and Moving Around Current Status [id number]): At least 1 percent but less than 20 percent impaired, limited or restricted Mobility: Walking and Moving Around Goal Status [phone]): At least 1 percent but less than 20 percent impaired, limited or restricted Mobility: Walking and Moving Around Discharge Status [phone]): At least 1 percent but less than 20 percent impaired, limited or restricted    Duncan Dull 12/30/2015, 8:56 AM Alben Deeds, PT DPT  [phone]

## 2015-12-30 NOTE — Progress Notes (Signed)
Patient ID: [name], female   DOB: July 25, 1933, 82 y.o.   MRN: [id number] Vital signs are stable Motor function is intact Dressing is clean and dry Doing well postoperatively We'll discharge in a.m.

## 2015-12-31 MED ORDER — DEXAMETHASONE 1 MG PO TABS: ORAL_TABLET | ORAL | 0 refills | Status: DC

## 2015-12-31 MED ORDER — DIAZEPAM 5 MG PO TABS: 5.0000 mg | ORAL_TABLET | Freq: Four times a day (QID) | ORAL | 0 refills | Status: DC | PRN

## 2015-12-31 MED ORDER — HYDROCODONE-ACETAMINOPHEN 5-325 MG PO TABS: 1.0000 | ORAL_TABLET | ORAL | 0 refills | Status: DC | PRN

## 2015-12-31 NOTE — Discharge Summary (Signed)
Physician Discharge Summary  Patient ID: [name] MRN: [id number] DOB/AGE: 1933-07-31 82 y.o.  Admit date: 12/29/2015 Discharge date: 12/31/2015  Admission Diagnoses:Lumbar stenosis L3-L4 with radiculopathy, status post decompression and fusion L4-L5.  Discharge Diagnoses: Lumbar stenosis L3-L4 with radiculopathy status post decompression and fusion L4-L5., Neurogenic claudication Active Problems:   Lumbar stenosis with neurogenic claudication   Discharged Condition: good  Hospital Course: Patient was admitted to undergo surgical decompression and Coflex fixation at the L3-4 level. She tolerated surgery well.  Consults: None  Significant Diagnostic Studies: None  Treatments: surgery: Bilateral laminotomies and foraminotomies L3-L4 with placement of Coflex L3-4  Discharge Exam: Blood pressure (!) 103/59, pulse 93, temperature 98 F (36.7 C), resp. rate 18, height 5\' 1"  (1.549 m), weight 65.8 kg (145 lb), SpO2 94 %. Incision is clean and dry motor function is intact in lower extremities  Disposition: 01-Home or Self Care  Discharge Instructions    Call MD for:  redness, tenderness, or signs of infection (pain, swelling, redness, odor or green/yellow discharge around incision site)    Complete by:  As directed   Call MD for:  severe uncontrolled pain    Complete by:  As directed   Call MD for:  temperature >100.4    Complete by:  As directed   Diet - low sodium heart healthy    Complete by:  As directed   Discharge instructions    Complete by:  As directed   Okay to shower. Do not apply salves or appointments to incision. No heavy lifting with the upper extremities greater than 15 pounds. May resume driving when not requiring pain medication and patient feels comfortable with doing so.   Increase activity slowly    Complete by:  As directed       Medication List    TAKE these medications   ALPRAZolam 0.5 MG tablet Commonly known as:  XANAX Take 0.5 mg by mouth at bedtime  as needed (restless legs).   AMBIEN CR 12.5 MG CR tablet Generic drug:  zolpidem Take 1 tablet by mouth daily.   aspirin 81 MG tablet Take 81 mg by mouth daily.   calcium carbonate 1250 (500 Ca) MG tablet Commonly known as:  OS-CAL - dosed in mg of elemental calcium Take 1 tablet by mouth daily.   clidinium-chlordiazePOXIDE 5-2.5 MG capsule Commonly known as:  LIBRAX Take 1 capsule by mouth 2 (two) times daily.   CRESTOR 5 MG tablet Generic drug:  rosuvastatin Take 5 mg by mouth at bedtime.   dexamethasone 1 MG tablet Commonly known as:  DECADRON 2 tablets twice daily for 2 days, one tablet twice daily for 2 days, one tablet daily for 2 days.   dexlansoprazole 60 MG capsule Commonly known as:  DEXILANT Take 60 mg by mouth daily.   diazepam 5 MG tablet Commonly known as:  VALIUM Take 5-10 mg by mouth See admin instructions. Take one hour prior to procedure What changed:  Another medication with the same name was added. Make sure you understand how and when to take each.   diazepam 5 MG tablet Commonly known as:  VALIUM Take 1 tablet (5 mg total) by mouth every 6 (six) hours as needed for muscle spasms. What changed:  You were already taking a medication with the same name, and this prescription was added. Make sure you understand how and when to take each.   ENDOCET 5-325 MG tablet Generic drug:  oxyCODONE-acetaminophen Take 1 tablet by mouth 2 (two)  times daily as needed for moderate pain.   escitalopram 10 MG tablet Commonly known as:  LEXAPRO Take 10 mg by mouth daily.   HYDROcodone-acetaminophen 5-325 MG tablet Commonly known as:  NORCO/VICODIN Take 1-2 tablets by mouth every 4 (four) hours as needed for moderate pain.   levothyroxine 25 MCG tablet Commonly known as:  SYNTHROID, LEVOTHROID Take 25 mcg by mouth daily.   MAGNESIUM PO Take 1 tablet by mouth daily.   methylcellulose 1 % ophthalmic solution Commonly known as:  ARTIFICIAL TEARS Place 1 drop  into both eyes daily as needed (dry eyes).   metoprolol succinate 25 MG 24 hr tablet Commonly known as:  TOPROL-XL Take 25 mg by mouth daily.   multivitamin tablet Take 1 tablet by mouth daily.   NORVASC 5 MG tablet Generic drug:  amLODipine Take 1 tablet by mouth daily.   pantoprazole 40 MG tablet Commonly known as:  PROTONIX Take 40 mg by mouth daily as needed (stomach ulcers).   sucralfate 1 g tablet Commonly known as:  CARAFATE Take 1 tablet by mouth 2 (two) times daily.   triamterene-hydrochlorothiazide 37.5-25 MG tablet Commonly known as:  MAXZIDE-25 Take 1 tablet by mouth daily.   vitamin C 500 MG tablet Commonly known as:  ASCORBIC ACID Take 500 mg by mouth daily.   Vitamin D (Ergocalciferol) 50000 units Caps capsule Commonly known as:  DRISDOL Take 50,000 Units by mouth every 7 (seven) days. Take on Friday        Signed: Earleen Newport 12/31/2015, 9:15 AM

## 2015-12-31 NOTE — Progress Notes (Signed)
Pt. discharged home accompanied by husband. Prescriptions and discharge instructions given with verbalization of understanding. Incision site on back with no s/s of infection - no swelling, redness, bleeding, and/or drainage noted. Opportunity given to ask questions but no question asked. Pt. transported out of this unit in wheelchair by the volunteer   

## 2016-01-13 DIAGNOSIS — M4806 Spinal stenosis, lumbar region: Secondary | ICD-10-CM | POA: Diagnosis not present

## 2016-01-13 DIAGNOSIS — M5416 Radiculopathy, lumbar region: Secondary | ICD-10-CM | POA: Diagnosis not present

## 2016-01-13 DIAGNOSIS — Z6827 Body mass index (BMI) 27.0-27.9, adult: Secondary | ICD-10-CM | POA: Diagnosis not present

## 2016-02-03 DIAGNOSIS — Z23 Encounter for immunization: Secondary | ICD-10-CM | POA: Diagnosis not present

## 2016-02-04 ENCOUNTER — Ambulatory Visit (INDEPENDENT_AMBULATORY_CARE_PROVIDER_SITE_OTHER): Payer: Medicare Other | Admitting: Interventional Cardiology

## 2016-02-04 ENCOUNTER — Encounter: Payer: Self-pay | Admitting: Interventional Cardiology

## 2016-02-04 ENCOUNTER — Encounter (INDEPENDENT_AMBULATORY_CARE_PROVIDER_SITE_OTHER): Payer: Self-pay

## 2016-02-04 VITALS — BP 104/66 | HR 83 | Ht 61.0 in | Wt 143.4 lb

## 2016-02-04 DIAGNOSIS — I10 Essential (primary) hypertension: Secondary | ICD-10-CM | POA: Diagnosis not present

## 2016-02-04 DIAGNOSIS — E785 Hyperlipidemia, unspecified: Secondary | ICD-10-CM | POA: Diagnosis not present

## 2016-02-04 DIAGNOSIS — I2510 Atherosclerotic heart disease of native coronary artery without angina pectoris: Secondary | ICD-10-CM

## 2016-02-04 NOTE — Patient Instructions (Signed)
Your physician recommends that you continue on your current medications as directed. Please refer to the Current Medication list given to you today.  Your physician wants you to follow-up in: 1 year with Dr.Smith You will receive a reminder letter in the mail two months in advance. If you don't receive a letter, please call our office to schedule the follow-up appointment.  

## 2016-02-04 NOTE — Progress Notes (Signed)
Cardiology Office Note    Date:  02/04/2016   ID:  [name], DOB [birth year], MRN [id number]  PCP:  Yvonne Lyons, MD  Cardiologist: Sinclair Grooms, MD   Chief Complaint  Patient presents with  . Follow-up  . Chest Pain    History of Present Illness:  [name] is a 82 y.o. female who is here for follow-up of nonobstructive coronary disease, hypertension, and hyperlipidemia.  Recently had lumbar spine surgery. No cardiac complications. No cardiac complaints. She is concerned that each sibling and her family is at heart attacks. She has been very direct and following up with preventive measures to avoid having acute events.  Past Medical History:  Diagnosis Date  . Arthritis   . Complication of anesthesia    Elevated blood pressure after having last Kyphoplasty; pt stated "I was given Morphine and had to stay overnight"  . DI (detrusor instability)   . Fracture of vertebra    x 3  . GERD (gastroesophageal reflux disease)   . History of hiatal hernia   . Hypertension   . Hypothyroidism   . Menopausal symptoms   . Osteoporosis   . Restless leg syndrome   . Varicose veins     Past Surgical History:  Procedure Laterality Date  . ABDOMINAL HYSTERECTOMY  1992   TAH,BSO  . BLADDER SUSPENSION  2011   CRYOMESH  . CARPAL TUNNEL RELEASE Bilateral 1982  . CHOLECYSTECTOMY  1992  . COLONOSCOPY    . EYE SURGERY Bilateral    Cataract removal  . KYPHOPLASTY     X 3  . LUMBAR LAMINECTOMY WITH COFLEX 1 LEVEL N/A 12/29/2015   Procedure: L3-4 Laminectomy with Coflex;  Surgeon: Kristeen Miss, MD;  Location: [facility];  Service: Neurosurgery;  Laterality: N/A;  L3-4 Laminectomy with coflex  . OOPHORECTOMY     BSO  . REPLACEMENT TOTAL KNEE  2008,  2011   RIGHT 2008, LEFT 2011  . SPINAL FUSION  2011  . VERTEBRAL SURGERY     T-8, T-10. T-12  DR Roselee Culver    Current Medications: Outpatient Medications Prior to Visit  Medication Sig Dispense Refill  . aspirin 81 MG  tablet Take 81 mg by mouth daily.    . calcium carbonate (OS-CAL - DOSED IN MG OF ELEMENTAL CALCIUM) 1250 MG tablet Take 1 tablet by mouth daily.      . clidinium-chlordiazePOXIDE (LIBRAX) 5-2.5 MG capsule Take 1 capsule by mouth 2 (two) times daily.    . CRESTOR 5 MG tablet Take 5 mg by mouth at bedtime.     Marland Kitchen dexlansoprazole (DEXILANT) 60 MG capsule Take 60 mg by mouth daily.      Marland Kitchen escitalopram (LEXAPRO) 10 MG tablet Take 10 mg by mouth daily.    Marland Kitchen HYDROcodone-acetaminophen (NORCO/VICODIN) 5-325 MG tablet Take 1-2 tablets by mouth every 4 (four) hours as needed for moderate pain. 60 tablet 0  . levothyroxine (SYNTHROID, LEVOTHROID) 25 MCG tablet Take 25 mcg by mouth daily.      Marland Kitchen MAGNESIUM PO Take 1 tablet by mouth daily.    . methylcellulose (ARTIFICIAL TEARS) 1 % ophthalmic solution Place 1 drop into both eyes daily as needed (dry eyes).     . metoprolol succinate (TOPROL-XL) 25 MG 24 hr tablet Take 25 mg by mouth daily.      . Multiple Vitamin (MULTIVITAMIN) tablet Take 1 tablet by mouth daily.      . NORVASC 5 MG tablet Take 1  tablet by mouth daily.    . sucralfate (CARAFATE) 1 G tablet Take 1 tablet by mouth 2 (two) times daily.  3  . triamterene-hydrochlorothiazide (MAXZIDE-25) 37.5-25 MG per tablet Take 1 tablet by mouth daily.      . vitamin C (ASCORBIC ACID) 500 MG tablet Take 500 mg by mouth daily.      . Vitamin D, Ergocalciferol, (DRISDOL) 50000 UNITS CAPS Take 50,000 Units by mouth every 7 (seven) days. Take on Friday     . ALPRAZolam (XANAX) 0.5 MG tablet Take 0.5 mg by mouth at bedtime as needed (restless legs).     Lorrin Mais CR 12.5 MG CR tablet Take 1 tablet by mouth daily.    Marland Kitchen dexamethasone (DECADRON) 1 MG tablet 2 tablets twice daily for 2 days, one tablet twice daily for 2 days, one tablet daily for 2 days. (Patient not taking: Reported on 02/04/2016) 15 tablet 0  . diazepam (VALIUM) 5 MG tablet Take 5-10 mg by mouth See admin instructions. Take one hour prior to procedure  0   . diazepam (VALIUM) 5 MG tablet Take 1 tablet (5 mg total) by mouth every 6 (six) hours as needed for muscle spasms. (Patient not taking: Reported on 02/04/2016) 40 tablet 0  . ENDOCET 5-325 MG per tablet Take 1 tablet by mouth 2 (two) times daily as needed for moderate pain.     . pantoprazole (PROTONIX) 40 MG tablet Take 40 mg by mouth daily as needed (stomach ulcers).      No facility-administered medications prior to visit.      Allergies:   Review of patient's allergies indicates no known allergies.   Social History   Social History  . Marital status: Married    Spouse name: N/A  . Number of children: N/A  . Years of education: N/A   Social History Main Topics  . Smoking status: Never Smoker  . Smokeless tobacco: Never Used  . Alcohol use No  . Drug use: No  . Sexual activity: Yes    Birth control/ protection: Surgical   Other Topics Concern  . None   Social History Narrative  . None     Family History:  The patient's family history includes Cancer in her sister and sister; Diabetes in her sister and sister; Heart disease in her brother, brother, father, and mother; Hypertension in her brother, brother, and sister; Stroke in her father.   ROS:   Please see the history of present illness.    Headaches, easy bruising, back pain, nausea, diarrhea, shortness of breath, leg swelling, unexplained weight gain, and headache.  All other systems reviewed and are negative.   PHYSICAL EXAM:   VS:  BP 104/66   Pulse 83   Ht 5\' 1"  (1.549 m)   Wt 143 lb 6.4 oz (65 kg)   BMI 27.10 kg/m    GEN: Well nourished, well developed, in no acute distress  HEENT: normal  Neck: no JVD, carotid bruits, or masses Cardiac: RRR; no murmurs, rubs, or gallops,no edema  Respiratory:  clear to auscultation bilaterally, normal work of breathing GI: soft, nontender, nondistended, + BS MS: no deformity or atrophy  Skin: warm and dry, no rash Neuro:  Alert and Oriented x 3, Strength and  sensation are intact Psych: euthymic mood, full affect  Wt Readings from Last 3 Encounters:  02/04/16 143 lb 6.4 oz (65 kg)  12/29/15 145 lb (65.8 kg)  12/21/15 145 lb 3.2 oz (65.9 kg)  Studies/Labs Reviewed:   EKG:  EKG  Normal sinus rhythm with left axis deviation. Suggestion of LVH is noted.  Recent Labs: 12/21/2015: BUN 17; Creatinine, Ser 1.34; Hemoglobin 13.6; Platelets 319; Potassium 4.1; Sodium 138   Lipid Panel No results found for: CHOL, TRIG, HDL, CHOLHDL, VLDL, LDLCALC, LDLDIRECT  Additional studies/ records that were reviewed today include:  No new data.    ASSESSMENT:    1. Essential hypertension   2. Coronary artery disease involving native coronary artery of native heart without angina pectoris   3. Hyperlipidemia      PLAN:  In order of problems listed above:  1. Low salt diet. Target blood pressure less than 140/90 mmHg. 2. Call if exertional chest discomfort or other complaints that would suggest angina. She has been documented to have coronary plaquing but no significant obstructive disease. 3. LDL target is less than 100 and preferably 70.    Medication Adjustments/Labs and Tests Ordered: Current medicines are reviewed at length with the patient today.  Concerns regarding medicines are outlined above.  Medication changes, Labs and Tests ordered today are listed in the Patient Instructions below. There are no Patient Instructions on file for this visit.   Signed, Sinclair Grooms, MD  02/04/2016 2:24 PM    [facility] [place], [place], [place]  [address] Phone: [phone]; Fax: [phone]

## 2016-02-25 DIAGNOSIS — M19071 Primary osteoarthritis, right ankle and foot: Secondary | ICD-10-CM | POA: Diagnosis not present

## 2016-02-25 DIAGNOSIS — M79672 Pain in left foot: Secondary | ICD-10-CM | POA: Diagnosis not present

## 2016-02-25 DIAGNOSIS — M25571 Pain in right ankle and joints of right foot: Secondary | ICD-10-CM | POA: Diagnosis not present

## 2016-03-14 DIAGNOSIS — R197 Diarrhea, unspecified: Secondary | ICD-10-CM | POA: Diagnosis not present

## 2016-03-14 DIAGNOSIS — K648 Other hemorrhoids: Secondary | ICD-10-CM | POA: Diagnosis not present

## 2016-03-14 DIAGNOSIS — K5730 Diverticulosis of large intestine without perforation or abscess without bleeding: Secondary | ICD-10-CM | POA: Diagnosis not present

## 2016-03-14 DIAGNOSIS — D126 Benign neoplasm of colon, unspecified: Secondary | ICD-10-CM | POA: Diagnosis not present

## 2016-03-14 DIAGNOSIS — K644 Residual hemorrhoidal skin tags: Secondary | ICD-10-CM | POA: Diagnosis not present

## 2016-03-14 DIAGNOSIS — Z86010 Personal history of colonic polyps: Secondary | ICD-10-CM | POA: Diagnosis not present

## 2016-03-16 DIAGNOSIS — E038 Other specified hypothyroidism: Secondary | ICD-10-CM | POA: Diagnosis not present

## 2016-03-16 DIAGNOSIS — N390 Urinary tract infection, site not specified: Secondary | ICD-10-CM | POA: Diagnosis not present

## 2016-03-16 DIAGNOSIS — R8299 Other abnormal findings in urine: Secondary | ICD-10-CM | POA: Diagnosis not present

## 2016-03-16 DIAGNOSIS — I10 Essential (primary) hypertension: Secondary | ICD-10-CM | POA: Diagnosis not present

## 2016-03-16 DIAGNOSIS — E784 Other hyperlipidemia: Secondary | ICD-10-CM | POA: Diagnosis not present

## 2016-03-16 DIAGNOSIS — M810 Age-related osteoporosis without current pathological fracture: Secondary | ICD-10-CM | POA: Diagnosis not present

## 2016-03-17 DIAGNOSIS — D126 Benign neoplasm of colon, unspecified: Secondary | ICD-10-CM | POA: Diagnosis not present

## 2016-03-28 DIAGNOSIS — L570 Actinic keratosis: Secondary | ICD-10-CM | POA: Diagnosis not present

## 2016-03-28 DIAGNOSIS — Z85828 Personal history of other malignant neoplasm of skin: Secondary | ICD-10-CM | POA: Diagnosis not present

## 2016-03-28 DIAGNOSIS — L821 Other seborrheic keratosis: Secondary | ICD-10-CM | POA: Diagnosis not present

## 2016-03-28 DIAGNOSIS — D692 Other nonthrombocytopenic purpura: Secondary | ICD-10-CM | POA: Diagnosis not present

## 2016-04-04 DIAGNOSIS — Z1212 Encounter for screening for malignant neoplasm of rectum: Secondary | ICD-10-CM | POA: Diagnosis not present

## 2016-04-07 DIAGNOSIS — K921 Melena: Secondary | ICD-10-CM | POA: Diagnosis not present

## 2016-04-07 DIAGNOSIS — Z6826 Body mass index (BMI) 26.0-26.9, adult: Secondary | ICD-10-CM | POA: Diagnosis not present

## 2016-04-07 DIAGNOSIS — R5383 Other fatigue: Secondary | ICD-10-CM | POA: Diagnosis not present

## 2016-04-07 DIAGNOSIS — D7589 Other specified diseases of blood and blood-forming organs: Secondary | ICD-10-CM | POA: Diagnosis not present

## 2016-04-07 DIAGNOSIS — I10 Essential (primary) hypertension: Secondary | ICD-10-CM | POA: Diagnosis not present

## 2016-04-12 DIAGNOSIS — K921 Melena: Secondary | ICD-10-CM | POA: Diagnosis not present

## 2016-04-12 DIAGNOSIS — R1013 Epigastric pain: Secondary | ICD-10-CM | POA: Diagnosis not present

## 2016-04-12 DIAGNOSIS — R198 Other specified symptoms and signs involving the digestive system and abdomen: Secondary | ICD-10-CM | POA: Diagnosis not present

## 2016-04-14 ENCOUNTER — Other Ambulatory Visit: Payer: Self-pay | Admitting: Gastroenterology

## 2016-04-14 DIAGNOSIS — K921 Melena: Secondary | ICD-10-CM

## 2016-04-14 DIAGNOSIS — R1013 Epigastric pain: Secondary | ICD-10-CM

## 2016-04-14 DIAGNOSIS — R198 Other specified symptoms and signs involving the digestive system and abdomen: Secondary | ICD-10-CM

## 2016-04-25 ENCOUNTER — Ambulatory Visit
Admission: RE | Admit: 2016-04-25 | Discharge: 2016-04-25 | Disposition: A | Discharge status: Home / Self Care | Payer: Medicare Other | Source: Ambulatory Visit | Attending: Gastroenterology | Admitting: Gastroenterology

## 2016-04-25 ENCOUNTER — Ambulatory Visit: Payer: Medicare Other | Admitting: Interventional Cardiology

## 2016-04-25 DIAGNOSIS — R198 Other specified symptoms and signs involving the digestive system and abdomen: Secondary | ICD-10-CM

## 2016-04-25 DIAGNOSIS — K921 Melena: Secondary | ICD-10-CM

## 2016-04-25 DIAGNOSIS — R1013 Epigastric pain: Secondary | ICD-10-CM | POA: Diagnosis not present

## 2016-04-25 MED ORDER — IOPAMIDOL (ISOVUE-300) INJECTION 61%
100.0000 mL | Freq: Once | INTRAVENOUS | Status: AC | PRN
  Administered 2016-04-25: 100 mL via INTRAVENOUS

## 2016-04-27 ENCOUNTER — Other Ambulatory Visit: Payer: Medicare Other

## 2016-04-27 DIAGNOSIS — W1830XA Fall on same level, unspecified, initial encounter: Secondary | ICD-10-CM | POA: Diagnosis not present

## 2016-04-27 DIAGNOSIS — S20212A Contusion of left front wall of thorax, initial encounter: Secondary | ICD-10-CM | POA: Diagnosis not present

## 2016-05-02 DIAGNOSIS — R300 Dysuria: Secondary | ICD-10-CM | POA: Diagnosis not present

## 2016-05-04 DIAGNOSIS — G5783 Other specified mononeuropathies of bilateral lower limbs: Secondary | ICD-10-CM | POA: Diagnosis not present

## 2016-05-05 DIAGNOSIS — B351 Tinea unguium: Secondary | ICD-10-CM | POA: Diagnosis not present

## 2016-05-05 DIAGNOSIS — L84 Corns and callosities: Secondary | ICD-10-CM | POA: Diagnosis not present

## 2016-05-05 DIAGNOSIS — G609 Hereditary and idiopathic neuropathy, unspecified: Secondary | ICD-10-CM | POA: Diagnosis not present

## 2016-05-11 DIAGNOSIS — M5416 Radiculopathy, lumbar region: Secondary | ICD-10-CM | POA: Diagnosis not present

## 2016-05-11 DIAGNOSIS — M48061 Spinal stenosis, lumbar region without neurogenic claudication: Secondary | ICD-10-CM | POA: Diagnosis not present

## 2016-05-11 DIAGNOSIS — I10 Essential (primary) hypertension: Secondary | ICD-10-CM | POA: Diagnosis not present

## 2016-05-11 DIAGNOSIS — Z6826 Body mass index (BMI) 26.0-26.9, adult: Secondary | ICD-10-CM | POA: Diagnosis not present

## 2016-06-22 DIAGNOSIS — G8929 Other chronic pain: Secondary | ICD-10-CM | POA: Diagnosis not present

## 2016-06-22 DIAGNOSIS — M25512 Pain in left shoulder: Secondary | ICD-10-CM | POA: Diagnosis not present

## 2016-07-04 DIAGNOSIS — Z6825 Body mass index (BMI) 25.0-25.9, adult: Secondary | ICD-10-CM | POA: Diagnosis not present

## 2016-07-04 DIAGNOSIS — J111 Influenza due to unidentified influenza virus with other respiratory manifestations: Secondary | ICD-10-CM | POA: Diagnosis not present

## 2016-07-04 DIAGNOSIS — R05 Cough: Secondary | ICD-10-CM | POA: Diagnosis not present

## 2016-07-18 DIAGNOSIS — M5412 Radiculopathy, cervical region: Secondary | ICD-10-CM | POA: Diagnosis not present

## 2016-07-18 DIAGNOSIS — M542 Cervicalgia: Secondary | ICD-10-CM | POA: Diagnosis not present

## 2016-07-19 DIAGNOSIS — B351 Tinea unguium: Secondary | ICD-10-CM | POA: Diagnosis not present

## 2016-07-19 DIAGNOSIS — L84 Corns and callosities: Secondary | ICD-10-CM | POA: Diagnosis not present

## 2016-07-19 DIAGNOSIS — G609 Hereditary and idiopathic neuropathy, unspecified: Secondary | ICD-10-CM | POA: Diagnosis not present

## 2016-07-28 DIAGNOSIS — Z471 Aftercare following joint replacement surgery: Secondary | ICD-10-CM | POA: Diagnosis not present

## 2016-07-28 DIAGNOSIS — Z96652 Presence of left artificial knee joint: Secondary | ICD-10-CM | POA: Diagnosis not present

## 2016-07-28 DIAGNOSIS — Z96651 Presence of right artificial knee joint: Secondary | ICD-10-CM | POA: Diagnosis not present

## 2016-07-28 DIAGNOSIS — Z96653 Presence of artificial knee joint, bilateral: Secondary | ICD-10-CM | POA: Diagnosis not present

## 2016-08-04 DIAGNOSIS — Z96652 Presence of left artificial knee joint: Secondary | ICD-10-CM | POA: Diagnosis not present

## 2016-08-04 DIAGNOSIS — Z96653 Presence of artificial knee joint, bilateral: Secondary | ICD-10-CM | POA: Diagnosis not present

## 2016-08-04 DIAGNOSIS — Z471 Aftercare following joint replacement surgery: Secondary | ICD-10-CM | POA: Diagnosis not present

## 2016-08-04 DIAGNOSIS — S8001XA Contusion of right knee, initial encounter: Secondary | ICD-10-CM | POA: Diagnosis not present

## 2016-08-04 DIAGNOSIS — Z96651 Presence of right artificial knee joint: Secondary | ICD-10-CM | POA: Diagnosis not present

## 2016-08-15 DIAGNOSIS — M5412 Radiculopathy, cervical region: Secondary | ICD-10-CM | POA: Diagnosis not present

## 2016-08-15 DIAGNOSIS — G8929 Other chronic pain: Secondary | ICD-10-CM | POA: Diagnosis not present

## 2016-08-15 DIAGNOSIS — Z6825 Body mass index (BMI) 25.0-25.9, adult: Secondary | ICD-10-CM | POA: Diagnosis not present

## 2016-08-15 DIAGNOSIS — M542 Cervicalgia: Secondary | ICD-10-CM | POA: Diagnosis not present

## 2016-08-29 DIAGNOSIS — M5412 Radiculopathy, cervical region: Secondary | ICD-10-CM | POA: Diagnosis not present

## 2016-09-09 DIAGNOSIS — M50223 Other cervical disc displacement at C6-C7 level: Secondary | ICD-10-CM | POA: Diagnosis not present

## 2016-09-09 DIAGNOSIS — M5412 Radiculopathy, cervical region: Secondary | ICD-10-CM | POA: Diagnosis not present

## 2016-09-22 DIAGNOSIS — M5012 Mid-cervical disc disorder, unspecified level: Secondary | ICD-10-CM | POA: Diagnosis not present

## 2016-09-22 DIAGNOSIS — M5412 Radiculopathy, cervical region: Secondary | ICD-10-CM | POA: Diagnosis not present

## 2016-09-22 DIAGNOSIS — M4722 Other spondylosis with radiculopathy, cervical region: Secondary | ICD-10-CM | POA: Diagnosis not present

## 2016-09-26 DIAGNOSIS — L821 Other seborrheic keratosis: Secondary | ICD-10-CM | POA: Diagnosis not present

## 2016-09-26 DIAGNOSIS — Z85828 Personal history of other malignant neoplasm of skin: Secondary | ICD-10-CM | POA: Diagnosis not present

## 2016-09-26 DIAGNOSIS — L570 Actinic keratosis: Secondary | ICD-10-CM | POA: Diagnosis not present

## 2016-09-26 DIAGNOSIS — L820 Inflamed seborrheic keratosis: Secondary | ICD-10-CM | POA: Diagnosis not present

## 2016-09-27 DIAGNOSIS — B351 Tinea unguium: Secondary | ICD-10-CM | POA: Diagnosis not present

## 2016-09-27 DIAGNOSIS — L84 Corns and callosities: Secondary | ICD-10-CM | POA: Diagnosis not present

## 2016-09-27 DIAGNOSIS — M79671 Pain in right foot: Secondary | ICD-10-CM | POA: Diagnosis not present

## 2016-09-27 DIAGNOSIS — G609 Hereditary and idiopathic neuropathy, unspecified: Secondary | ICD-10-CM | POA: Diagnosis not present

## 2016-10-06 DIAGNOSIS — Z96653 Presence of artificial knee joint, bilateral: Secondary | ICD-10-CM | POA: Diagnosis not present

## 2016-10-06 DIAGNOSIS — Z471 Aftercare following joint replacement surgery: Secondary | ICD-10-CM | POA: Diagnosis not present

## 2016-10-10 DIAGNOSIS — M50123 Cervical disc disorder at C6-C7 level with radiculopathy: Secondary | ICD-10-CM | POA: Diagnosis not present

## 2016-10-10 DIAGNOSIS — M50022 Cervical disc disorder at C5-C6 level with myelopathy: Secondary | ICD-10-CM | POA: Diagnosis not present

## 2016-10-10 DIAGNOSIS — M4802 Spinal stenosis, cervical region: Secondary | ICD-10-CM | POA: Diagnosis not present

## 2016-10-10 DIAGNOSIS — M4722 Other spondylosis with radiculopathy, cervical region: Secondary | ICD-10-CM | POA: Diagnosis not present

## 2016-10-10 DIAGNOSIS — M50122 Cervical disc disorder at C5-C6 level with radiculopathy: Secondary | ICD-10-CM | POA: Diagnosis not present

## 2016-10-10 DIAGNOSIS — M47892 Other spondylosis, cervical region: Secondary | ICD-10-CM | POA: Diagnosis not present

## 2016-11-01 DIAGNOSIS — H26491 Other secondary cataract, right eye: Secondary | ICD-10-CM | POA: Diagnosis not present

## 2016-11-01 DIAGNOSIS — Z961 Presence of intraocular lens: Secondary | ICD-10-CM | POA: Diagnosis not present

## 2016-11-02 DIAGNOSIS — M5412 Radiculopathy, cervical region: Secondary | ICD-10-CM | POA: Diagnosis not present

## 2016-12-13 DIAGNOSIS — G609 Hereditary and idiopathic neuropathy, unspecified: Secondary | ICD-10-CM | POA: Diagnosis not present

## 2016-12-13 DIAGNOSIS — L84 Corns and callosities: Secondary | ICD-10-CM | POA: Diagnosis not present

## 2016-12-13 DIAGNOSIS — B351 Tinea unguium: Secondary | ICD-10-CM | POA: Diagnosis not present

## 2016-12-13 DIAGNOSIS — M79676 Pain in unspecified toe(s): Secondary | ICD-10-CM | POA: Diagnosis not present

## 2016-12-19 DIAGNOSIS — M5012 Mid-cervical disc disorder, unspecified level: Secondary | ICD-10-CM | POA: Diagnosis not present

## 2016-12-23 DIAGNOSIS — M5012 Mid-cervical disc disorder, unspecified level: Secondary | ICD-10-CM | POA: Diagnosis not present

## 2017-01-02 DIAGNOSIS — M5012 Mid-cervical disc disorder, unspecified level: Secondary | ICD-10-CM | POA: Diagnosis not present

## 2017-01-05 DIAGNOSIS — M5012 Mid-cervical disc disorder, unspecified level: Secondary | ICD-10-CM | POA: Diagnosis not present

## 2017-01-11 DIAGNOSIS — M5012 Mid-cervical disc disorder, unspecified level: Secondary | ICD-10-CM | POA: Diagnosis not present

## 2017-01-13 DIAGNOSIS — M5012 Mid-cervical disc disorder, unspecified level: Secondary | ICD-10-CM | POA: Diagnosis not present

## 2017-01-17 DIAGNOSIS — M5012 Mid-cervical disc disorder, unspecified level: Secondary | ICD-10-CM | POA: Diagnosis not present

## 2017-01-19 DIAGNOSIS — M5012 Mid-cervical disc disorder, unspecified level: Secondary | ICD-10-CM | POA: Diagnosis not present

## 2017-01-20 ENCOUNTER — Encounter: Payer: Self-pay | Admitting: Interventional Cardiology

## 2017-01-25 DIAGNOSIS — M5012 Mid-cervical disc disorder, unspecified level: Secondary | ICD-10-CM | POA: Diagnosis not present

## 2017-02-02 DIAGNOSIS — M5012 Mid-cervical disc disorder, unspecified level: Secondary | ICD-10-CM | POA: Diagnosis not present

## 2017-02-06 ENCOUNTER — Ambulatory Visit: Payer: Medicare Other | Admitting: Interventional Cardiology

## 2017-02-07 DIAGNOSIS — M5012 Mid-cervical disc disorder, unspecified level: Secondary | ICD-10-CM | POA: Diagnosis not present

## 2017-02-08 DIAGNOSIS — Z23 Encounter for immunization: Secondary | ICD-10-CM | POA: Diagnosis not present

## 2017-02-08 NOTE — Progress Notes (Signed)
Cardiology Office Note    Date:  02/09/2017   ID:  [name], DOB September 17, 1933, MRN [id number]  PCP:  Burnard Bunting, MD  Cardiologist: Sinclair Grooms, MD   Chief Complaint  Patient presents with  . Coronary Artery Disease    History of Present Illness:  [name] is a 83 y.o. female who is here for follow-up of nonobstructive coronary disease, hypertension, and hyperlipidemia  [name] is doing well. She is concerned about numbness and tingling in her legs and feet. She wonders if it could be PAD. Current complaints or at rest. There is no exacerbation or precipitation of leg discomfort with walking.  She denies angina pectoris. She is able to lie flat. She has no peripheral edema. She denies orthopnea.  No medication side effects that she can tell. She has terrible heartburn begin Cipro worse if she does not take Protonix. She does not equate any of the heartburn symptoms with angina. The heartburn is related to certain foods. It is not aggravated by physical activity.   Past Medical History:  Diagnosis Date  . Arthritis   . Complication of anesthesia    Elevated blood pressure after having last Kyphoplasty; pt stated "I was given Morphine and had to stay overnight"  . DI (detrusor instability)   . Fracture of vertebra    x 3  . GERD (gastroesophageal reflux disease)   . History of hiatal hernia   . Hypertension   . Hypothyroidism   . Menopausal symptoms   . Osteoporosis   . Restless leg syndrome   . Varicose veins     Past Surgical History:  Procedure Laterality Date  . ABDOMINAL HYSTERECTOMY  1992   TAH,BSO  . BLADDER SUSPENSION  2011   CRYOMESH  . CARPAL TUNNEL RELEASE Bilateral 1982  . CHOLECYSTECTOMY  1992  . COLONOSCOPY    . EYE SURGERY Bilateral    Cataract removal  . KYPHOPLASTY     X 3  . LUMBAR LAMINECTOMY WITH COFLEX 1 LEVEL N/A 12/29/2015   Procedure: L3-4 Laminectomy with Coflex;  Surgeon: Kristeen Miss, MD;  Location: [facility];  Service: Neurosurgery;  Laterality: N/A;  L3-4 Laminectomy with coflex  . OOPHORECTOMY     BSO  . REPLACEMENT TOTAL KNEE  2008,  2011   RIGHT 2008, LEFT 2011  . SPINAL FUSION  2011  . VERTEBRAL SURGERY     T-8, T-10. T-12  DR Roselee Culver    Current Medications: Outpatient Medications Prior to Visit  Medication Sig Dispense Refill  . ALPRAZolam (XANAX) 0.5 MG tablet Take 0.5 mg by mouth 2 (two) times daily as needed (restless legs).    Marland Kitchen aspirin 81 MG tablet Take 81 mg by mouth daily.    . calcium carbonate (OS-CAL - DOSED IN MG OF ELEMENTAL CALCIUM) 1250 MG tablet Take 1 tablet by mouth daily.      . clidinium-chlordiazePOXIDE (LIBRAX) 5-2.5 MG capsule Take 1 capsule by mouth 2 (two) times daily as needed (GI Spasms).     . CRESTOR 5 MG tablet Take 5 mg by mouth at bedtime.     Marland Kitchen escitalopram (LEXAPRO) 10 MG tablet Take 10 mg by mouth daily.    Marland Kitchen HYDROcodone-acetaminophen (NORCO/VICODIN) 5-325 MG tablet Take 1-2 tablets by mouth every 4 (four) hours as needed for moderate pain. 60 tablet 0  . levothyroxine (SYNTHROID, LEVOTHROID) 25 MCG tablet Take 25 mcg by mouth daily.      Marland Kitchen MAGNESIUM PO  Take 1 tablet by mouth daily.    . metoprolol succinate (TOPROL-XL) 25 MG 24 hr tablet Take 25 mg by mouth daily.      . Multiple Vitamin (MULTIVITAMIN) tablet Take 1 tablet by mouth daily.      . NORVASC 5 MG tablet Take 1 tablet by mouth daily.    . pantoprazole (PROTONIX) 40 MG tablet Take 40 mg by mouth daily.    Marland Kitchen triamterene-hydrochlorothiazide (MAXZIDE-25) 37.5-25 MG per tablet Take 1 tablet by mouth daily.      . vitamin C (ASCORBIC ACID) 500 MG tablet Take 500 mg by mouth daily.      . Vitamin D, Ergocalciferol, (DRISDOL) 50000 UNITS CAPS Take 50,000 Units by mouth every 7 (seven) days. Take on Friday     . zolpidem (AMBIEN CR) 12.5 MG CR tablet Take 12.5 mg by mouth at bedtime.    Marland Kitchen dexlansoprazole (DEXILANT) 60 MG capsule Take 60 mg by mouth daily.      . methylcellulose (ARTIFICIAL  TEARS) 1 % ophthalmic solution Place 1 drop into both eyes daily as needed (dry eyes).     . sucralfate (CARAFATE) 1 G tablet Take 1 tablet by mouth 2 (two) times daily.  3   No facility-administered medications prior to visit.      Allergies:   Patient has no known allergies.   Social History   Social History  . Marital status: Married    Spouse name: N/A  . Number of children: N/A  . Years of education: N/A   Social History Main Topics  . Smoking status: Never Smoker  . Smokeless tobacco: Never Used  . Alcohol use No  . Drug use: No  . Sexual activity: Yes    Birth control/ protection: Surgical   Other Topics Concern  . None   Social History Narrative  . None     Family History:  The patient's family history includes Cancer in her sister and sister; Diabetes in her sister and sister; Heart disease in her brother, brother, father, and mother; Hypertension in her brother, brother, and sister; Stroke in her father.   ROS:   Please see the history of present illness.    Joint swelling, legs burning with fact Tiffani and at rest. Ankle swelling.  All other systems reviewed and are negative.   PHYSICAL EXAM:   VS:  BP 102/70 (BP Location: Right Arm)   Pulse 89   Ht 5\' 1"  (1.549 m)   Wt 138 lb 12.8 oz (63 kg)   BMI 26.23 kg/m    GEN: Well nourished, well developed, in no acute distress  HEENT: normal  Neck: no JVD, carotid bruits, or masses Cardiac: RRR; no murmurs, rubs, or gallops,no edema  Respiratory:  clear to auscultation bilaterally, normal work of breathing GI: soft, nontender, nondistended, + BS MS: no deformity or atrophy  Skin: warm and dry, no rash Neuro:  Alert and Oriented x 3, Strength and sensation are intact Psych: euthymic mood, full affect  Wt Readings from Last 3 Encounters:  02/09/17 138 lb 12.8 oz (63 kg)  02/04/16 143 lb 6.4 oz (65 kg)  12/29/15 145 lb (65.8 kg)      Studies/Labs Reviewed:   EKG:  EKG  Normal sinus rhythm, left axis  deviation, poor R-wave progression, and when compared to the prior tracing, no new changes are noted.  Recent Labs: No results found for requested labs within last 8760 hours.   Lipid Panel No results found for: CHOL,  TRIG, HDL, CHOLHDL, VLDL, LDLCALC, LDLDIRECT  Additional studies/ records that were reviewed today include:  No new data    ASSESSMENT:    1. Coronary artery disease involving native coronary artery of native heart without angina pectoris   2. Essential hypertension   3. Other hyperlipidemia      PLAN:  In order of problems listed above:  1. Coronary disease is asymptomatic. Continue aggressive risk factor modification including blood pressure and lipid control (targets 130/85 mmHg or less and LDL less than 70). 2. Excellent blood pressure control 3. Followed by primary care. Last LDL was 80 and October 2017.  Encouraged aerobic activity. Follow-up in one year. Call if anginal complaints.  Medication Adjustments/Labs and Tests Ordered: Current medicines are reviewed at length with the patient today.  Concerns regarding medicines are outlined above.  Medication changes, Labs and Tests ordered today are listed in the Patient Instructions below. Patient Instructions  Medication Instructions:  None  Labwork: None  Testing/Procedures: None  Follow-Up: Your physician wants you to follow-up in: 1 year with Dr. Tamala Julian.  You will receive a reminder letter in the mail two months in advance. If you don't receive a letter, please call our office to schedule the follow-up appointment.   Any Other Special Instructions Will Be Listed Below (If Applicable).     If you need a refill on your cardiac medications before your next appointment, please call your pharmacy.      Signed, Sinclair Grooms, MD  02/09/2017 12:23 PM    [facility] [address], [place], [place]  [address] Phone: [phone]; Fax: [phone]

## 2017-02-09 ENCOUNTER — Encounter: Payer: Self-pay | Admitting: Interventional Cardiology

## 2017-02-09 ENCOUNTER — Ambulatory Visit (INDEPENDENT_AMBULATORY_CARE_PROVIDER_SITE_OTHER): Payer: Medicare Other | Admitting: Interventional Cardiology

## 2017-02-09 VITALS — BP 102/70 | HR 89 | Ht 61.0 in | Wt 138.8 lb

## 2017-02-09 DIAGNOSIS — I10 Essential (primary) hypertension: Secondary | ICD-10-CM | POA: Diagnosis not present

## 2017-02-09 DIAGNOSIS — E784 Other hyperlipidemia: Secondary | ICD-10-CM

## 2017-02-09 DIAGNOSIS — E7849 Other hyperlipidemia: Secondary | ICD-10-CM

## 2017-02-09 DIAGNOSIS — I2510 Atherosclerotic heart disease of native coronary artery without angina pectoris: Secondary | ICD-10-CM | POA: Diagnosis not present

## 2017-02-09 NOTE — Patient Instructions (Signed)

## 2017-02-13 DIAGNOSIS — M5012 Mid-cervical disc disorder, unspecified level: Secondary | ICD-10-CM | POA: Diagnosis not present

## 2017-02-16 DIAGNOSIS — M5012 Mid-cervical disc disorder, unspecified level: Secondary | ICD-10-CM | POA: Diagnosis not present

## 2017-02-20 DIAGNOSIS — M5012 Mid-cervical disc disorder, unspecified level: Secondary | ICD-10-CM | POA: Diagnosis not present

## 2017-02-21 DIAGNOSIS — Z124 Encounter for screening for malignant neoplasm of cervix: Secondary | ICD-10-CM | POA: Diagnosis not present

## 2017-02-21 DIAGNOSIS — R350 Frequency of micturition: Secondary | ICD-10-CM | POA: Diagnosis not present

## 2017-02-21 DIAGNOSIS — Z1231 Encounter for screening mammogram for malignant neoplasm of breast: Secondary | ICD-10-CM | POA: Diagnosis not present

## 2017-02-21 DIAGNOSIS — R300 Dysuria: Secondary | ICD-10-CM | POA: Diagnosis not present

## 2017-02-21 DIAGNOSIS — Z01419 Encounter for gynecological examination (general) (routine) without abnormal findings: Secondary | ICD-10-CM | POA: Diagnosis not present

## 2017-02-27 DIAGNOSIS — R300 Dysuria: Secondary | ICD-10-CM | POA: Diagnosis not present

## 2017-02-27 DIAGNOSIS — N390 Urinary tract infection, site not specified: Secondary | ICD-10-CM | POA: Diagnosis not present

## 2017-02-27 DIAGNOSIS — M5012 Mid-cervical disc disorder, unspecified level: Secondary | ICD-10-CM | POA: Diagnosis not present

## 2017-03-03 DIAGNOSIS — M5416 Radiculopathy, lumbar region: Secondary | ICD-10-CM | POA: Diagnosis not present

## 2017-03-03 DIAGNOSIS — Z9889 Other specified postprocedural states: Secondary | ICD-10-CM | POA: Diagnosis not present

## 2017-03-03 DIAGNOSIS — M48061 Spinal stenosis, lumbar region without neurogenic claudication: Secondary | ICD-10-CM | POA: Diagnosis not present

## 2017-03-03 DIAGNOSIS — M4726 Other spondylosis with radiculopathy, lumbar region: Secondary | ICD-10-CM | POA: Diagnosis not present

## 2017-03-03 DIAGNOSIS — M5136 Other intervertebral disc degeneration, lumbar region: Secondary | ICD-10-CM | POA: Diagnosis not present

## 2017-03-22 DIAGNOSIS — I10 Essential (primary) hypertension: Secondary | ICD-10-CM | POA: Diagnosis not present

## 2017-03-22 DIAGNOSIS — E038 Other specified hypothyroidism: Secondary | ICD-10-CM | POA: Diagnosis not present

## 2017-03-22 DIAGNOSIS — R82998 Other abnormal findings in urine: Secondary | ICD-10-CM | POA: Diagnosis not present

## 2017-03-22 DIAGNOSIS — M810 Age-related osteoporosis without current pathological fracture: Secondary | ICD-10-CM | POA: Diagnosis not present

## 2017-04-03 DIAGNOSIS — Z1212 Encounter for screening for malignant neoplasm of rectum: Secondary | ICD-10-CM | POA: Diagnosis not present

## 2017-04-04 DIAGNOSIS — L84 Corns and callosities: Secondary | ICD-10-CM | POA: Diagnosis not present

## 2017-04-04 DIAGNOSIS — M79676 Pain in unspecified toe(s): Secondary | ICD-10-CM | POA: Diagnosis not present

## 2017-04-04 DIAGNOSIS — G609 Hereditary and idiopathic neuropathy, unspecified: Secondary | ICD-10-CM | POA: Diagnosis not present

## 2017-04-04 DIAGNOSIS — B351 Tinea unguium: Secondary | ICD-10-CM | POA: Diagnosis not present

## 2017-04-05 DIAGNOSIS — Z6826 Body mass index (BMI) 26.0-26.9, adult: Secondary | ICD-10-CM | POA: Diagnosis not present

## 2017-04-05 DIAGNOSIS — M810 Age-related osteoporosis without current pathological fracture: Secondary | ICD-10-CM | POA: Diagnosis not present

## 2017-04-24 DIAGNOSIS — Z85828 Personal history of other malignant neoplasm of skin: Secondary | ICD-10-CM | POA: Diagnosis not present

## 2017-04-24 DIAGNOSIS — L570 Actinic keratosis: Secondary | ICD-10-CM | POA: Diagnosis not present

## 2017-04-24 DIAGNOSIS — L723 Sebaceous cyst: Secondary | ICD-10-CM | POA: Diagnosis not present

## 2017-04-24 DIAGNOSIS — D485 Neoplasm of uncertain behavior of skin: Secondary | ICD-10-CM | POA: Diagnosis not present

## 2017-04-24 DIAGNOSIS — L821 Other seborrheic keratosis: Secondary | ICD-10-CM | POA: Diagnosis not present

## 2017-04-24 DIAGNOSIS — L438 Other lichen planus: Secondary | ICD-10-CM | POA: Diagnosis not present

## 2017-04-24 DIAGNOSIS — D692 Other nonthrombocytopenic purpura: Secondary | ICD-10-CM | POA: Diagnosis not present

## 2017-05-02 DIAGNOSIS — H0014 Chalazion left upper eyelid: Secondary | ICD-10-CM | POA: Diagnosis not present

## 2017-05-15 ENCOUNTER — Ambulatory Visit (HOSPITAL_COMMUNITY): Payer: Medicare Other

## 2017-07-25 DIAGNOSIS — L84 Corns and callosities: Secondary | ICD-10-CM | POA: Diagnosis not present

## 2017-07-25 DIAGNOSIS — G609 Hereditary and idiopathic neuropathy, unspecified: Secondary | ICD-10-CM | POA: Diagnosis not present

## 2017-07-25 DIAGNOSIS — M79676 Pain in unspecified toe(s): Secondary | ICD-10-CM | POA: Diagnosis not present

## 2017-07-25 DIAGNOSIS — B351 Tinea unguium: Secondary | ICD-10-CM | POA: Diagnosis not present

## 2017-08-30 ENCOUNTER — Ambulatory Visit (INDEPENDENT_AMBULATORY_CARE_PROVIDER_SITE_OTHER): Payer: Medicare Other

## 2017-08-30 ENCOUNTER — Encounter: Payer: Self-pay | Admitting: Podiatry

## 2017-08-30 ENCOUNTER — Ambulatory Visit (INDEPENDENT_AMBULATORY_CARE_PROVIDER_SITE_OTHER): Payer: Medicare Other | Admitting: Podiatry

## 2017-08-30 ENCOUNTER — Other Ambulatory Visit: Payer: Self-pay | Admitting: Podiatry

## 2017-08-30 DIAGNOSIS — M25572 Pain in left ankle and joints of left foot: Secondary | ICD-10-CM

## 2017-08-30 DIAGNOSIS — M25571 Pain in right ankle and joints of right foot: Secondary | ICD-10-CM | POA: Diagnosis not present

## 2017-08-30 DIAGNOSIS — M779 Enthesopathy, unspecified: Secondary | ICD-10-CM

## 2017-08-30 DIAGNOSIS — Q828 Other specified congenital malformations of skin: Secondary | ICD-10-CM

## 2017-08-30 MED ORDER — TRIAMCINOLONE ACETONIDE 10 MG/ML IJ SUSP
10.0000 mg | Freq: Once | INTRAMUSCULAR | Status: AC
  Administered 2017-08-30: 10 mg

## 2017-08-30 NOTE — Progress Notes (Signed)
Subjective:   Patient ID: [name], female   DOB: 84 y.o.   MRN: [id number]   HPI Patient presents with pain in the ankles of both feet and lesions underneath the right foot which have become tender.  Patient states that she has to do a lot of yard work and has been having trouble doing it patient does not smoke and likes to be active.  Patient states the pain is been present for around 6 months   Review of Systems  All other systems reviewed and are negative.       Objective:  Physical Exam  Constitutional: She appears well-developed and well-nourished.  Cardiovascular: Intact distal pulses.  Pulmonary/Chest: Effort normal.  Musculoskeletal: Normal range of motion.  Neurological: She is alert.  Skin: Skin is warm.  Nursing note and vitals reviewed.   Neurovascular status intact muscle strength adequate range of motion within normal limits with patient found to have inflammation and pain of the sinus tarsi bilateral with fluid buildup and discomfort with palpation.  Patient is also noted to have significant keratotic lesion sub-first and fifth metatarsal of the right foot that are thick and painful and hard for her to walk with.  Patient is noted to have good digital perfusion and is well oriented x3     Assessment:  Inflammatory sinus tarsitis bilateral with fluid buildup and keratotic lesions right also complains of pain in the third interspace right but not as significant     Plan:  H&P x-rays reviewed and will get a focus on the ankle and the lesion formation.  I injected sinus tarsi bilateral 3 mg Kenalog 5 mg Xylocaine and did sterile sharp debridement of lesions on the right foot with no iatrogenic bleeding and we will reevaluate in 3 weeks and may require orthotics and also we will look at between the third and fourth toes if it remains tender  X-rays indicate no signs of advanced arthritis or stress fracture

## 2017-09-11 DIAGNOSIS — M79671 Pain in right foot: Secondary | ICD-10-CM | POA: Diagnosis not present

## 2017-09-11 DIAGNOSIS — M25571 Pain in right ankle and joints of right foot: Secondary | ICD-10-CM | POA: Diagnosis not present

## 2017-09-11 DIAGNOSIS — G609 Hereditary and idiopathic neuropathy, unspecified: Secondary | ICD-10-CM | POA: Diagnosis not present

## 2017-09-11 DIAGNOSIS — L84 Corns and callosities: Secondary | ICD-10-CM | POA: Diagnosis not present

## 2017-09-20 ENCOUNTER — Ambulatory Visit: Payer: Medicare Other | Admitting: Podiatry

## 2017-10-04 ENCOUNTER — Ambulatory Visit: Payer: Medicare Other | Admitting: Neurology

## 2017-10-11 DIAGNOSIS — S92422A Displaced fracture of distal phalanx of left great toe, initial encounter for closed fracture: Secondary | ICD-10-CM | POA: Diagnosis not present

## 2017-10-16 DIAGNOSIS — Z85828 Personal history of other malignant neoplasm of skin: Secondary | ICD-10-CM | POA: Diagnosis not present

## 2017-10-16 DIAGNOSIS — L905 Scar conditions and fibrosis of skin: Secondary | ICD-10-CM | POA: Diagnosis not present

## 2017-10-16 DIAGNOSIS — D485 Neoplasm of uncertain behavior of skin: Secondary | ICD-10-CM | POA: Diagnosis not present

## 2017-10-16 DIAGNOSIS — L570 Actinic keratosis: Secondary | ICD-10-CM | POA: Diagnosis not present

## 2017-10-24 ENCOUNTER — Encounter: Payer: Self-pay | Admitting: Vascular Surgery

## 2017-10-24 ENCOUNTER — Ambulatory Visit (INDEPENDENT_AMBULATORY_CARE_PROVIDER_SITE_OTHER): Payer: Medicare Other | Admitting: Vascular Surgery

## 2017-10-24 ENCOUNTER — Other Ambulatory Visit: Payer: Self-pay

## 2017-10-24 VITALS — BP 106/70 | HR 79 | Temp 97.8°F | Resp 18 | Ht 61.0 in | Wt 135.0 lb

## 2017-10-24 DIAGNOSIS — G609 Hereditary and idiopathic neuropathy, unspecified: Secondary | ICD-10-CM

## 2017-10-24 NOTE — Progress Notes (Signed)
Vascular and Vein Specialist of [facility]  Patient name: [name] MRN: [id number] DOB: November 23, 1933 Sex: female  REASON FOR VISIT: Follow-up lower extremity symptoms  HPI: [name] is a 84 y.o. female here today for evaluation of her lower extremity symptoms.  She reports burning and stinging sensation from her knees down to her feet and ankle.  This is on both right and left leg.  This has been progressively worse.  She is concerned regarding this.  She did have the unexpected death of her husband within the last 6 months following a fall.  She does have a history of bilateral knee replacements and also a history of back surgery.  She had been seen in outlying vein center where she had undergone ablation of her great and small saphenous veins.  The significant swelling in her right or left leg.  She has had injections of her ankle and reports no improvement from this  Past Medical History:  Diagnosis Date  . Arthritis   . Complication of anesthesia    Elevated blood pressure after having last Kyphoplasty; pt stated "I was given Morphine and had to stay overnight"  . DI (detrusor instability)   . Fracture of vertebra    x 3  . GERD (gastroesophageal reflux disease)   . History of hiatal hernia   . Hypertension   . Hypothyroidism   . Menopausal symptoms   . Osteoporosis   . Restless leg syndrome   . Varicose veins     Family History  Problem Relation Age of Onset  . Heart disease Mother   . Heart disease Father   . Stroke Father   . Hypertension Sister   . Diabetes Sister   . Cancer Sister        OVARIAN CA  . Hypertension Brother   . Heart disease Brother   . Diabetes Sister   . Cancer Sister        Colon  . Heart disease Brother   . Hypertension Brother     SOCIAL HISTORY: Social History   Tobacco Use  . Smoking status: Never Smoker  . Smokeless tobacco: Never Used  Substance Use Topics  . Alcohol use: No    No  Known Allergies  Current Outpatient Medications  Medication Sig Dispense Refill  . ALPRAZolam (XANAX) 0.5 MG tablet Take 0.5 mg by mouth 2 (two) times daily as needed (restless legs).    Marland Kitchen aspirin 81 MG tablet Take 81 mg by mouth daily.    . calcium carbonate (OS-CAL - DOSED IN MG OF ELEMENTAL CALCIUM) 1250 MG tablet Take 1 tablet by mouth daily.      . clidinium-chlordiazePOXIDE (LIBRAX) 5-2.5 MG capsule Take 1 capsule by mouth 2 (two) times daily as needed (GI Spasms).     . CRESTOR 5 MG tablet Take 5 mg by mouth at bedtime.     Marland Kitchen escitalopram (LEXAPRO) 10 MG tablet Take 10 mg by mouth daily.    Marland Kitchen levothyroxine (SYNTHROID, LEVOTHROID) 25 MCG tablet Take 25 mcg by mouth daily.      Marland Kitchen MAGNESIUM PO Take 1 tablet by mouth daily.    . metoprolol succinate (TOPROL-XL) 25 MG 24 hr tablet Take 25 mg by mouth daily.      . Multiple Vitamin (MULTIVITAMIN) tablet Take 1 tablet by mouth daily.      . NORVASC 5 MG tablet Take 1 tablet by mouth daily.    . pantoprazole (PROTONIX) 40 MG tablet Take  40 mg by mouth daily.    Marland Kitchen triamterene-hydrochlorothiazide (MAXZIDE-25) 37.5-25 MG per tablet Take 1 tablet by mouth daily.      . vitamin C (ASCORBIC ACID) 500 MG tablet Take 500 mg by mouth daily.      . Vitamin D, Ergocalciferol, (DRISDOL) 50000 UNITS CAPS Take 50,000 Units by mouth every 7 (seven) days. Take on Friday     . zolpidem (AMBIEN CR) 12.5 MG CR tablet Take 12.5 mg by mouth at bedtime.    Marland Kitchen HYDROcodone-acetaminophen (NORCO/VICODIN) 5-325 MG tablet Take 1-2 tablets by mouth every 4 (four) hours as needed for moderate pain. (Patient not taking: Reported on 10/24/2017) 60 tablet 0   No current facility-administered medications for this visit.     REVIEW OF SYSTEMS:  [X]  denotes positive finding, [ ]  denotes negative finding Cardiac  Comments:  Chest pain or chest pressure:    Shortness of breath upon exertion:    Short of breath when lying flat:    Irregular heart rhythm:        Vascular      Pain in calf, thigh, or hip brought on by ambulation:    Pain in feet at night that wakes you up from your sleep:     Blood clot in your veins:    Leg swelling:  x         PHYSICAL EXAM: Vitals:   10/24/17 1112  BP: 106/70  Pulse: 79  Resp: 18  Temp: 97.8 F (36.6 C)  TempSrc: Oral  SpO2: 95%  Weight: 135 lb (61.2 kg)  Height: 5\' 1"  (1.549 m)    GENERAL: The patient is a well-nourished female, in no acute distress. The vital signs are documented above. CARDIOVASCULAR: Plus radial and 2+ dorsalis pedis pulses bilaterally PULMONARY: There is good air exchange  MUSCULOSKELETAL: There are no major deformities or cyanosis. NEUROLOGIC: No focal weakness or paresthesias are detected. SKIN: There are no ulcers or rashes noted. PSYCHIATRIC: The patient has a normal affect.  DATA:  She did not undergo formal venous duplex today in our office.  I did image her veins with sinus site ultrasound and this does not show any distention of her surface veins.  She had had a formal duplex at our last visit in 2015 also showing this with no major venous pathology  MEDICAL ISSUES: I reassured the patient that she does not have any evidence of significant arterial or venous pathology that would explain this.  Stands to be appear to be neuropathic.  She had a brief trial of gabapentin but did not stop taking this.  I have encouraged her to give this another trial to determine whether this will give her any symptom relief.  She was comforted by our discussion and will see [place] again on an as-needed basis    Rosetta Posner, MD [facility] Vascular and Vein Specialists of [facility] Tel [phone] Pager [phone]

## 2017-10-31 DIAGNOSIS — H26491 Other secondary cataract, right eye: Secondary | ICD-10-CM | POA: Diagnosis not present

## 2017-10-31 DIAGNOSIS — Z961 Presence of intraocular lens: Secondary | ICD-10-CM | POA: Diagnosis not present

## 2017-11-01 DIAGNOSIS — S92422A Displaced fracture of distal phalanx of left great toe, initial encounter for closed fracture: Secondary | ICD-10-CM | POA: Diagnosis not present

## 2017-11-22 DIAGNOSIS — S92415D Nondisplaced fracture of proximal phalanx of left great toe, subsequent encounter for fracture with routine healing: Secondary | ICD-10-CM | POA: Diagnosis not present

## 2017-11-23 ENCOUNTER — Ambulatory Visit (INDEPENDENT_AMBULATORY_CARE_PROVIDER_SITE_OTHER): Payer: Medicare Other | Admitting: Neurology

## 2017-11-23 ENCOUNTER — Encounter: Payer: Self-pay | Admitting: Neurology

## 2017-11-23 ENCOUNTER — Other Ambulatory Visit: Payer: Self-pay

## 2017-11-23 VITALS — BP 115/73 | HR 74 | Ht 61.0 in | Wt 138.0 lb

## 2017-11-23 DIAGNOSIS — G603 Idiopathic progressive neuropathy: Secondary | ICD-10-CM | POA: Diagnosis not present

## 2017-11-23 DIAGNOSIS — E538 Deficiency of other specified B group vitamins: Secondary | ICD-10-CM | POA: Diagnosis not present

## 2017-11-23 MED ORDER — DULOXETINE HCL 30 MG PO CPEP: 30.0000 mg | ORAL_CAPSULE | Freq: Every day | ORAL | 3 refills | Status: DC

## 2017-11-23 NOTE — Patient Instructions (Signed)
We will start cymbalta for the leg discomfort, stop the lexapro.  Cymbalta (duloxetine) is an antidepressant medication that is commonly used for peripheral neuropathy pain or for fibromyalgia pain. As with any antidepressant medication, worsening depression can be seen. This medication can potentially cause headache, dizziness, sexual dysfunction, or nausea. If any problems are noted on this medication, please contact our office.

## 2017-11-23 NOTE — Progress Notes (Signed)
Reason for visit: Possible peripheral neuropathy  Referring physician: Dr. Orpah Greek is a 84 y.o. female  History of present illness:  [name] is an 84 year old right-handed white female with a history of cervical and lumbosacral spine surgery in the past.  The patient also has a history of polio that resulted in a right sided foot drop that has been chronic in nature.  The patient has noted over the last 4 years that she has developed a gradually progressive, burning sensations in the feet that has spread up the leg to the knee area.  The patient indicates that the symptoms are worse during the daytime when she is ambulating or sitting, she feels better at night and she is able to rest, but early in the morning the symptoms return.  The patient feels worse if she wears socks on her feet.  The patient has had bilateral carpal tunnel syndrome surgery in the past as well.  The patient is followed by Dr. Doran Durand from podiatry.  She gives a history of bilateral total knee replacements previously.  She does report some mild gait instability, she has not had any falls.  She denies any burning sensations on the hands.  In the past, she has been on Lyrica but could not tolerate the drug.  She has not had problems controlling the bowels or the bladder.  She is sent to this office for an evaluation.  She has had epidural steroid injections in the back on 3 occasions, the third injection did help the back and slight benefit with the feet was also noted.  The patient is followed by Dr. Ellene Route.  Past Medical History:  Diagnosis Date  . Arthritis   . Complication of anesthesia    Elevated blood pressure after having last Kyphoplasty; pt stated "I was given Morphine and had to stay overnight"  . DI (detrusor instability)   . Fracture of vertebra    x 3  . GERD (gastroesophageal reflux disease)   . History of hiatal hernia   . Hypertension   . Hypothyroidism   . Menopausal symptoms   .  Osteoporosis   . Restless leg syndrome   . Varicose veins     Past Surgical History:  Procedure Laterality Date  . ABDOMINAL HYSTERECTOMY  1992   TAH,BSO  . BLADDER SUSPENSION  2011   CRYOMESH  . CARPAL TUNNEL RELEASE Bilateral 1982  . CHOLECYSTECTOMY  1992  . COLONOSCOPY    . EYE SURGERY Bilateral    Cataract removal  . KYPHOPLASTY     X 3  . LUMBAR LAMINECTOMY WITH COFLEX 1 LEVEL N/A 12/29/2015   Procedure: L3-4 Laminectomy with Coflex;  Surgeon: Kristeen Miss, MD;  Location: [facility];  Service: Neurosurgery;  Laterality: N/A;  L3-4 Laminectomy with coflex  . OOPHORECTOMY     BSO  . REPLACEMENT TOTAL KNEE  2008,  2011   RIGHT 2008, LEFT 2011  . SPINAL FUSION  2011  . VERTEBRAL SURGERY     T-8, T-10. T-12  DR Roselee Culver    Family History  Problem Relation Age of Onset  . Heart disease Mother   . Heart disease Father   . Stroke Father   . Hypertension Sister   . Diabetes Sister   . Cancer Sister        OVARIAN CA  . Hypertension Brother   . Heart disease Brother   . Diabetes Sister   . Cancer Sister  Colon  . Heart disease Brother   . Hypertension Brother     Social history:  reports that she has never smoked. She has never used smokeless tobacco. She reports that she does not drink alcohol or use drugs.  Medications:  Prior to Admission medications   Medication Sig Start Date End Date Taking? Authorizing Provider  ALPRAZolam Duanne Moron) 0.5 MG tablet Take 0.5 mg by mouth 2 (two) times daily as needed (restless legs).   Yes [provider]  aspirin 81 MG tablet Take 81 mg by mouth daily.   Yes [provider]  calcium carbonate (OS-CAL - DOSED IN MG OF ELEMENTAL CALCIUM) 1250 MG tablet Take 1 tablet by mouth daily.     Yes [provider]  CRESTOR 5 MG tablet Take 5 mg by mouth at bedtime.  02/19/14  Yes [provider]  dexlansoprazole (DEXILANT) 60 MG capsule Take 60 mg by mouth daily.   Yes [provider]    levothyroxine (SYNTHROID, LEVOTHROID) 25 MCG tablet Take 25 mcg by mouth daily.     Yes [provider]  MAGNESIUM PO Take 1 tablet by mouth daily.   Yes [provider]  metoprolol succinate (TOPROL-XL) 25 MG 24 hr tablet Take 25 mg by mouth daily.     Yes [provider]  Multiple Vitamin (MULTIVITAMIN) tablet Take 1 tablet by mouth daily.     Yes [provider]  NORVASC 5 MG tablet Take 1 tablet by mouth daily. 02/02/15  Yes [provider]  triamterene-hydrochlorothiazide (MAXZIDE-25) 37.5-25 MG per tablet Take 1 tablet by mouth daily.     Yes [provider]  vitamin C (ASCORBIC ACID) 500 MG tablet Take 500 mg by mouth daily.     Yes [provider]  Vitamin D, Ergocalciferol, (DRISDOL) 50000 UNITS CAPS Take 50,000 Units by mouth every 7 (seven) days. Take on Friday    Yes [provider]  zolpidem (AMBIEN CR) 12.5 MG CR tablet Take 12.5 mg by mouth at bedtime.   Yes [provider]  DULoxetine (CYMBALTA) 30 MG capsule Take 1 capsule (30 mg total) by mouth daily. Brand medically necessary 11/23/17   Kathrynn Ducking, MD     No Known Allergies  ROS:  Out of a complete 14 system review of symptoms, the patient complains only of the following symptoms, and all other reviewed systems are negative.  Fatigue Easy bruising, easy bleeding Joint pain, muscle cramps, aching muscles Decreased energy Insomnia, sleepiness, restless legs  Blood pressure 115/73, pulse 74, height 5\' 1"  (1.549 m), weight 138 lb (62.6 kg).  Physical Exam  General: The patient is alert and cooperative at the time of the examination.  Eyes: Pupils are equal, round, and reactive to light. Discs are flat bilaterally.  Neck: The neck is supple, no carotid bruits are noted.  Respiratory: The respiratory examination is clear.  Cardiovascular: The cardiovascular examination reveals a regular rate and rhythm, no obvious murmurs or rubs  are noted.  Skin: Extremities are without significant edema.  Neurologic Exam  Mental status: The patient is alert and oriented x 3 at the time of the examination. The patient has apparent normal recent and remote memory, with an apparently normal attention span and concentration ability.  Cranial nerves: Facial symmetry is present. There is good sensation of the face to pinprick and soft touch bilaterally. The strength of the facial muscles and the muscles to head turning and shoulder shrug are normal bilaterally. Speech is  well enunciated, no aphasia or dysarthria is noted. Extraocular movements are full. Visual fields are full. The tongue is midline, and the patient has symmetric elevation of the soft palate. No obvious hearing deficits are noted.  Motor: The motor testing reveals 5 over 5 strength of all 4 extremities, with exception of a prominent right foot drop. Good symmetric motor tone is noted throughout.  Sensory: Sensory testing is intact to pinprick, soft touch, vibration sensation, and position sense on all 4 extremities, with the exception that there is a stocking pattern pinprick sensory deficit in the distal third of the lower extremities below the knee, slight impairment of position sense in the right foot. No evidence of extinction is noted.  Coordination: Cerebellar testing reveals good finger-nose-finger and heel-to-shin bilaterally.  Gait and station: Gait is normal. Tandem gait is minimally unsteady. Romberg is negative. No drift is seen.  Reflexes: Deep tendon reflexes are symmetric, but are depressed bilaterally. Toes are downgoing bilaterally.   Assessment/Plan:  1.  Possible peripheral neuropathy  2.  History of polio, right foot drop  3.  Prior cervical and lumbosacral spine surgery  The patient will be set up for blood work today.  She will be taken off of her Lexapro and start on Cymbalta for the burning dysesthesias.  She will return for nerve conduction  studies of both legs and EMG on the left leg.  She will otherwise follow-up in 6 months.  Jill Alexanders MD 11/23/2017 8:50 AM  [facility] [address] [place], [place] [address]  Phone [phone] Fax [phone]

## 2017-11-26 ENCOUNTER — Telehealth: Payer: Self-pay | Admitting: Neurology

## 2017-11-26 LAB — MULTIPLE MYELOMA PANEL, SERUM
Albumin SerPl Elph-Mcnc: 3.9 g/dL (ref 2.9–4.4)
Albumin/Glob SerPl: 1.5 (ref 0.7–1.7)
Alpha 1: 0.2 g/dL (ref 0.0–0.4)
Alpha2 Glob SerPl Elph-Mcnc: 0.6 g/dL (ref 0.4–1.0)
B-Globulin SerPl Elph-Mcnc: 1 g/dL (ref 0.7–1.3)
Gamma Glob SerPl Elph-Mcnc: 0.8 g/dL (ref 0.4–1.8)
Globulin, Total: 2.7 g/dL (ref 2.2–3.9)
IgA/Immunoglobulin A, Serum: 414 mg/dL (ref 64–422)
IgG (Immunoglobin G), Serum: 771 mg/dL (ref 700–1600)
IgM (Immunoglobulin M), Srm: 70 mg/dL (ref 26–217)
M Protein SerPl Elph-Mcnc: 0.3 g/dL — ABNORMAL HIGH
Total Protein: 6.6 g/dL (ref 6.0–8.5)

## 2017-11-26 LAB — B. BURGDORFI ANTIBODIES: Lyme IgG/IgM Ab: <0.91 {ISR} (ref 0.00–0.90)

## 2017-11-26 LAB — VITAMIN B12: Vitamin B-12: >2000 pg/mL — ABNORMAL HIGH (ref 232–1245)

## 2017-11-26 LAB — ANGIOTENSIN CONVERTING ENZYME: Angio Convert Enzyme: 53 U/L (ref 14–82)

## 2017-11-26 LAB — SEDIMENTATION RATE: Sed Rate: 2 mm/hr (ref 0–40)

## 2017-11-26 LAB — ANA W/REFLEX: Anti Nuclear Antibody(ANA): NEGATIVE

## 2017-11-26 NOTE — Telephone Encounter (Signed)
I called the patient.  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  We may consider a referral to oncology in the near future.  The patient will be coming in for EMG and nerve conduction study evaluation.

## 2017-11-27 ENCOUNTER — Telehealth: Payer: Self-pay | Admitting: *Deleted

## 2017-11-27 NOTE — Telephone Encounter (Signed)
Pt called need a 90 day supply for cymbalts  call in @ [facility].

## 2017-11-27 NOTE — Telephone Encounter (Signed)
Called and LVM for pt (ok per DPR) letting her know that since Dr. Jannifer Franklin started her on medication on 11/23/17 we usually recommend she try the medication for about a month first to make sure she tolerates with no SE and it is beneficial prior to sending in 90 days supply. Gave [facility] phone number if she has further questions/concerns.

## 2017-12-27 ENCOUNTER — Ambulatory Visit (INDEPENDENT_AMBULATORY_CARE_PROVIDER_SITE_OTHER): Payer: Medicare Other | Admitting: Neurology

## 2017-12-27 ENCOUNTER — Encounter: Payer: Self-pay | Admitting: Neurology

## 2017-12-27 DIAGNOSIS — G603 Idiopathic progressive neuropathy: Secondary | ICD-10-CM

## 2017-12-27 DIAGNOSIS — D472 Monoclonal gammopathy: Secondary | ICD-10-CM

## 2017-12-27 DIAGNOSIS — G629 Polyneuropathy, unspecified: Secondary | ICD-10-CM

## 2017-12-27 HISTORY — DX: Polyneuropathy, unspecified: G62.9

## 2017-12-27 HISTORY — DX: Monoclonal gammopathy: D47.2

## 2017-12-27 MED ORDER — BACLOFEN 10 MG PO TABS
5.0000 mg | ORAL_TABLET | Freq: Every day | ORAL | 2 refills | Status: DC
Start: 2017-12-27 — End: 2018-03-18

## 2017-12-27 NOTE — Progress Notes (Signed)
Please refer to EMG nerve conduction study procedure note. 

## 2017-12-27 NOTE — Procedures (Signed)
     HISTORY:  [name] is an 84 year old patient with a history of burning dysesthesias in the feet and legs.  The patient is being evaluated for a possible neuropathy or a lumbosacral radiculopathy.  The patient does have a prior history of polio affecting the right leg.  NERVE CONDUCTION STUDIES:  Nerve conduction studies were performed on both lower extremities.  The distal motor latencies for the peroneal nerves were prolonged on the left, normal on the right with low motor amplitudes seen for these nerves.  The distal motor latencies for the posterior tibial nerves were prolonged bilaterally with low motor amplitudes seen for these nerves bilaterally.  Slowing was seen for the peroneal and posterior tibial nerves bilaterally.  The sensory latencies for the peroneal and sural nerves were unobtainable bilaterally.  The F-wave latencies for the posterior tibial nerves were prolonged bilaterally.  EMG STUDIES:  EMG study was performed on the left lower extremity:  The tibialis anterior muscle reveals 2 to 4K motor units with decreased recruitment. No fibrillations or positive waves were seen. The peroneus tertius muscle reveals 2 to 4K motor units with decreased recruitment. No fibrillations or positive waves were seen. The medial gastrocnemius muscle reveals 1 to 3K motor units with slightly decreased recruitment. No fibrillations or positive waves were seen. The vastus lateralis muscle reveals 2 to 4K motor units with full recruitment. No fibrillations or positive waves were seen. The iliopsoas muscle reveals 2 to 4K motor units with full recruitment. No fibrillations or positive waves were seen. The biceps femoris muscle (long head) reveals 2 to 4K motor units with full recruitment. No fibrillations or positive waves were seen. The lumbosacral paraspinal muscles were tested at 3 levels, and revealed no abnormalities of insertional activity at all 3 levels tested. There was good  relaxation.   IMPRESSION:  Nerve conduction studies done on both lower extremities shows evidence of a primarily axonal peripheral neuropathy of moderate severity.  EMG evaluation of the left lower extremity shows chronic stable distal signs of denervation consistent with a diagnosis of peripheral neuropathy.  No clear evidence of an overlying lumbosacral radiculopathy was seen.  Jill Alexanders MD 12/27/2017 3:01 PM  [facility] [address] [place], [place] [address]  Phone [phone] Fax [phone]

## 2017-12-27 NOTE — Progress Notes (Addendum)
The patient comes in for EMG nerve conduction study today.  The study shows evidence of moderate severity axonal peripheral neuropathy.  The patient has had blood work done showing an IgA monoclonal antibody, the significance of this is not clear.  The patient will be sent for a hematology/oncology consultation.  She is on Cymbalta for her neuropathy with minimal benefit, she is having nocturnal leg cramps almost every night.  We will add low-dose baclofen.  She will call for any dose adjustments.  The Cymbalta dose may be increased to twice daily if the baclofen does not offer much benefit.       [place]    Nerve / Sites Muscle Latency Ref. Amplitude Ref. Rel Amp Segments Distance Velocity Ref. Area    ms ms mV mV %  cm m/s m/s mVms  L Peroneal - EDB     Ankle EDB 7.5 ?6.5 1.2 ?2.0 100 Ankle - EDB 9   4.3     Fib head EDB 16.0  1.0  81.3 Fib head - Ankle 32 37 ?44 4.6     Pop fossa EDB 18.9  1.2  119 Pop fossa - Fib head 10 35 ?44 6.0         Pop fossa - Ankle      R Peroneal - EDB     Ankle EDB 6.0 ?6.5 1.0 ?2.0 100 Ankle - EDB 9   4.1     Fib head EDB 13.3  0.8  80.3 Fib head - Ankle 32 44 ?44 3.0     Pop fossa EDB 15.7  1.3  159 Pop fossa - Fib head 10 42 ?44 5.9         Pop fossa - Ankle      L Tibial - AH     Ankle AH 6.6 ?5.8 3.4 ?4.0 100 Ankle - AH 9   6.6     Pop fossa AH 16.7  1.7  50.3 Pop fossa - Ankle 36 35 ?41 4.5  R Tibial - AH     Ankle AH 6.7 ?5.8 1.6 ?4.0 100 Ankle - AH 9   3.6     Pop fossa AH 16.9  0.7  41.8 Pop fossa - Ankle 36 35 ?41 1.8             SNC    Nerve / Sites Rec. Site Peak Lat Ref.  Amp Ref. Segments Distance    ms ms V V  cm  L Sural - Ankle (Calf)     Calf Ankle NR ?4.4 NR ?6 Calf - Ankle 14  R Sural - Ankle (Calf)     Calf Ankle NR ?4.4 NR ?6 Calf - Ankle 14  L Superficial peroneal - Ankle     Lat leg Ankle NR ?4.4 NR ?6 Lat leg - Ankle 14  R Superficial peroneal - Ankle     Lat leg Ankle NR ?4.4 NR ?6 Lat leg - Ankle 14              F   Wave    Nerve F Lat Ref.   ms ms  L Tibial - AH 61.4 ?56.0  R Tibial - AH 62.1 ?56.0

## 2018-01-01 ENCOUNTER — Telehealth: Payer: Self-pay | Admitting: Hematology

## 2018-01-01 ENCOUNTER — Encounter: Payer: Self-pay | Admitting: Hematology

## 2018-01-01 NOTE — Telephone Encounter (Signed)
New referral received from [facility]. Pt has been scheduled to see Dr. Daisy Lazar 8/29 at 10am. Letter mailed.

## 2018-01-05 DIAGNOSIS — M170 Bilateral primary osteoarthritis of knee: Secondary | ICD-10-CM | POA: Diagnosis not present

## 2018-01-13 DIAGNOSIS — S81802A Unspecified open wound, left lower leg, initial encounter: Secondary | ICD-10-CM | POA: Diagnosis not present

## 2018-01-17 DIAGNOSIS — L03119 Cellulitis of unspecified part of limb: Secondary | ICD-10-CM | POA: Diagnosis not present

## 2018-01-19 DIAGNOSIS — L03119 Cellulitis of unspecified part of limb: Secondary | ICD-10-CM | POA: Diagnosis not present

## 2018-01-24 NOTE — Progress Notes (Signed)
HEMATOLOGY/ONCOLOGY CONSULTATION NOTE  Date of Service: 01/25/2018  Patient Care Team: Burnard Bunting, MD as PCP - General (Internal Medicine) Corky Sing, PA-C as Physician Assistant (Physician Assistant)  CHIEF COMPLAINTS/PURPOSE OF CONSULTATION:  MGUS   HISTORY OF PRESENTING ILLNESS:   [name] is a wonderful 84 y.o. female who has been referred to [place] by Dr. Burnard Bunting for evaluation and management of MGUS. The pt reports that she is doing well overall.   The pt sees Dr. Margette Fast in Neurology for management of her idiopathic progressive neuropathy. She notes that her legs have burned from the knees down for the past three years, and has worsened. Her 12/27/17 NCV with EMG did not reveal evidence of an overlying lumbosacral radiculopathy. She denies ever having a diagnosis of DM. She had knee replacements in 2008 and 2011. She did not have any neuropathy symptoms around the times of surgery. She had a neck surgery with artificial disc that resolved her previous hand neuropathic symptoms. She has also had her back fused in 2011. She notes that her upper arms do hurt intermittently. She denies ever consuming alcohol or smoking.   The pt reports that she has felt more tired over the last 6 months as compared to previously, but denies new bone pains. The pt does note that she lost her husband about 6 months ago after a 78 year long relationship.   The pt notes that she took Forteo for two years for osteoporosis.   The pt notes that she "always" has frequent swelling, soreness, and abdominal discomfort. She takes multiple vitamins and minerals each day including magnesium, Vitamin C, and a Vitamin B complex  Most recent lab results 11/23/17 MMP revealed all values WNL except for 0.3 of IgA with kappa light chain specificity.   On review of systems, pt reports feeling tired, worsening peripheral neuropathy in her feet, stable abdominal discomfort, and denies new bone  pains, pain along the spine, leg swelling, and any other symptoms.   On PMHx the pt reports b/l knee surgeries in 2008 and 2011, neck surgery in 2011 with artificial disc. On Social Hx the pt reports that she has never smoked cigarettes nor consumed alcohol.   MEDICAL HISTORY:  Past Medical History:  Diagnosis Date   Arthritis    Complication of anesthesia    Elevated blood pressure after having last Kyphoplasty; pt stated "I was given Morphine and had to stay overnight"   DI (detrusor instability)    Fracture of vertebra    x 3   GERD (gastroesophageal reflux disease)    History of hiatal hernia    Hypertension    Hypothyroidism    Menopausal symptoms    MGUS (monoclonal gammopathy of unknown significance) 12/27/2017   IgA   Osteoporosis    Peripheral neuropathy 12/27/2017   Restless leg syndrome    Varicose veins     SURGICAL HISTORY: Past Surgical History:  Procedure Laterality Date   ABDOMINAL HYSTERECTOMY  1992   TAH,BSO   BLADDER SUSPENSION  2011   CRYOMESH   CARPAL TUNNEL RELEASE Bilateral 1982   CHOLECYSTECTOMY  1992   COLONOSCOPY     EYE SURGERY Bilateral    Cataract removal   KYPHOPLASTY     X 3   LUMBAR LAMINECTOMY WITH COFLEX 1 LEVEL N/A 12/29/2015   Procedure: L3-4 Laminectomy with Coflex;  Surgeon: Kristeen Miss, MD;  Location: [facility];  Service: Neurosurgery;  Laterality: N/A;  L3-4 Laminectomy with  coflex   OOPHORECTOMY     BSO   REPLACEMENT TOTAL KNEE  2008,  2011   RIGHT 2008, LEFT 2011   SPINAL FUSION  2011   VERTEBRAL SURGERY     T-8, T-10. T-12  DR Roselee Culver    SOCIAL HISTORY: Social History   Socioeconomic History   Marital status: Married    Spouse name: Not on file   Number of children: 2   Years of education: 12   Highest education level: Not on file  Occupational History   Occupation: Retired  Scientist, product/process development strain: Not on file   Food insecurity:    Worry: Not on file     Inability: Not on Lexicographer needs:    Medical: Not on file    Non-medical: Not on file  Tobacco Use   Smoking status: Never Smoker   Smokeless tobacco: Never Used  Substance and Sexual Activity   Alcohol use: No   Drug use: No   Sexual activity: Yes    Birth control/protection: Surgical  Lifestyle   Physical activity:    Days per week: Not on file    Minutes per session: Not on file   Stress: Not on file  Relationships   Social connections:    Talks on phone: Not on file    Gets together: Not on file    Attends religious service: Not on file    Active member of club or organization: Not on file    Attends meetings of clubs or organizations: Not on file    Relationship status: Not on file   Intimate partner violence:    Fear of current or ex partner: Not on file    Emotionally abused: Not on file    Physically abused: Not on file    Forced sexual activity: Not on file  Other Topics Concern   Not on file  Social History Narrative   Lives  alone   Caffeine use: decaf only   Right handed     FAMILY HISTORY: Family History  Problem Relation Age of Onset   Heart disease Mother    Heart disease Father    Stroke Father    Hypertension Sister    Diabetes Sister    Cancer Sister        OVARIAN CA   Hypertension Brother    Heart disease Brother    Diabetes Sister    Cancer Sister        Colon   Heart disease Brother    Hypertension Brother     ALLERGIES:  has No Known Allergies.  MEDICATIONS:  Current Outpatient Medications  Medication Sig Dispense Refill   ALPRAZolam (XANAX) 0.5 MG tablet Take 0.5 mg by mouth 2 (two) times daily as needed (restless legs).     aspirin 81 MG tablet Take 81 mg by mouth daily.     baclofen (LIORESAL) 10 MG tablet Take 0.5 tablets (5 mg total) by mouth at bedtime. 15 each 2   calcium carbonate (OS-CAL - DOSED IN MG OF ELEMENTAL CALCIUM) 1250 MG tablet Take 1 tablet by mouth daily.        CRESTOR 5 MG tablet Take 5 mg by mouth at bedtime.      dexlansoprazole (DEXILANT) 60 MG capsule Take 60 mg by mouth daily.     DULoxetine (CYMBALTA) 30 MG capsule Take 1 capsule (30 mg total) by mouth daily. Brand medically necessary 30 capsule 3   levothyroxine (SYNTHROID,  LEVOTHROID) 25 MCG tablet Take 25 mcg by mouth daily.       MAGNESIUM PO Take 1 tablet by mouth daily.     metoprolol succinate (TOPROL-XL) 25 MG 24 hr tablet Take 25 mg by mouth daily.       Multiple Vitamin (MULTIVITAMIN) tablet Take 1 tablet by mouth daily.       NORVASC 5 MG tablet Take 1 tablet by mouth daily.     triamterene-hydrochlorothiazide (MAXZIDE-25) 37.5-25 MG per tablet Take 1 tablet by mouth daily.       vitamin C (ASCORBIC ACID) 500 MG tablet Take 500 mg by mouth daily.       Vitamin D, Ergocalciferol, (DRISDOL) 50000 UNITS CAPS Take 50,000 Units by mouth every 7 (seven) days. Take on Friday      zolpidem (AMBIEN CR) 12.5 MG CR tablet Take 12.5 mg by mouth at bedtime.     No current facility-administered medications for this visit.     REVIEW OF SYSTEMS:    10 Point review of Systems was done is negative except as noted above.  PHYSICAL EXAMINATION:  . Vitals:   01/25/18 0946  BP: 138/69  Pulse: 79  Resp: 18  Temp: 98.3 F (36.8 C)  SpO2: 97%   Filed Weights   01/25/18 0946  Weight: 138 lb 14.4 oz (63 kg)   .Body mass index is 26.24 kg/m.  GENERAL:alert, in no acute distress and comfortable SKIN: no acute rashes, no significant lesions EYES: conjunctiva are pink and non-injected, sclera anicteric OROPHARYNX: MMM, no exudates, no oropharyngeal erythema or ulceration NECK: supple, no JVD LYMPH:  no palpable lymphadenopathy in the cervical, axillary or inguinal regions LUNGS: clear to auscultation b/l with normal respiratory effort HEART: regular rate & rhythm ABDOMEN:  normoactive bowel sounds , non tender, not distended. Extremity: no pedal edema PSYCH: alert & oriented  x 3 with fluent speech NEURO: no focal motor/sensory deficits  LABORATORY DATA:  I have reviewed the data as listed  . CBC Latest Ref Rng & Units 12/21/2015 03/24/2010 12/31/2009  WBC 4.0 - 10.5 K/uL 5.1 5.3 6.0  Hemoglobin 12.0 - 15.0 g/dL 13.6 14.3 11.4 RESULT REPEATED AND VERIFIED POST TRANSFUSION SPECIMEN(L)  Hematocrit 36.0 - 46.0 % 42.1 41.4 32.8(L)  Platelets 150 - 400 K/uL 319 288 202    . CMP Latest Ref Rng & Units 11/23/2017 12/21/2015 03/24/2010  Glucose 65 - 99 mg/dL - 93 87  BUN 6 - 20 mg/dL - 17 20  Creatinine 0.44 - 1.00 mg/dL - 1.34(H) 0.97  Sodium 135 - 145 mmol/L - 138 138  Potassium 3.5 - 5.1 mmol/L - 4.1 3.8  Chloride 101 - 111 mmol/L - 102 101  CO2 22 - 32 mmol/L - 29 31  Calcium 8.9 - 10.3 mg/dL - 9.7 10.3  Total Protein 6.0 - 8.5 g/dL 6.6 - -  Total Bilirubin 0.3 - 1.2 mg/dL - - -  Alkaline Phos 39 - 117 U/L - - -  AST 0 - 37 U/L - - -  ALT 0 - 35 U/L - - -     RADIOGRAPHIC STUDIES: I have personally reviewed the radiological images as listed and agreed with the findings in the report. No results found.  ASSESSMENT & PLAN:  84 y.o. female with   1. 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 Orders Placed This Encounter  Procedures   DG Bone Survey Met    Standing Status:   Future    Standing Expiration Date:   03/27/2019    Order Specific Question:   Reason for Exam (SYMPTOM  OR DIAGNOSIS REQUIRED)     Answer:   staging monoclonal paraproteinemia    Order Specific Question:   Preferred imaging location?    Answer:   [facility]   CBC with Differential/Platelet    Standing Status:   Future    Number of Occurrences:   1    Standing Expiration Date:   03/01/2019   CMP ([place] only)    Standing Status:   Future    Number of Occurrences:   1    Standing Expiration Date:   01/26/2019   Multiple Myeloma Panel (SPEP&IFE w/QIG)    Standing Status:   Future    Number of Occurrences:   1    Standing Expiration Date:   01/26/2019   Lactate dehydrogenase    Standing Status:   Future    Number of Occurrences:   1    Standing Expiration Date:   01/26/2019   Sedimentation rate    Standing Status:   Future    Number of Occurrences:   1    Standing Expiration Date:   01/25/2019   Kappa/lambda light chains    Standing Status:   Future    Number of Occurrences:   1    Standing Expiration Date:   03/01/2019   24 Hr Ur UPEP/TP    Standing Status:   Future    Number of Occurrences:   1    Standing Expiration Date:   01/25/2019   2. . Patient Active Problem List   Diagnosis Date Noted   Peripheral neuropathy 12/27/2017   MGUS (monoclonal gammopathy of unknown significance) 12/27/2017   Lumbar stenosis with neurogenic claudication 12/29/2015   Coronary artery disease involving native heart 03/07/2014   Hyperlipidemia 03/07/2014   Essential hypertension 03/07/2014   Fracture of vertebra    Osteoporosis    DI (detrusor instability)    Menopausal symptoms   -mx of chronic co-morbids per PCP  Labs today Bone survey in 2 weeks RTC with Dr Irene Limbo in 3 weeks   All of the patients questions were answered with apparent satisfaction. The patient knows to call the clinic with any problems, questions or concerns.  The total time spent in the appt was 35 minutes and more than 50% was on counseling and direct patient cares.    Sullivan Lone MD MS AAHIVMS [facility]  [facility] Hematology/Oncology Physician [facility]  (Office):       [phone] (Work cell):  [phone] (Fax):           [phone]  01/25/2018 10:23 AM  I, Baldwin [place], am acting as a scribe for Dr. Irene Limbo  .I have reviewed the above documentation for accuracy and completeness, and I agree with the above. Brunetta Genera MD

## 2018-01-25 ENCOUNTER — Encounter: Payer: Self-pay | Admitting: Hematology

## 2018-01-25 ENCOUNTER — Inpatient Hospital Stay: Payer: Medicare Other

## 2018-01-25 ENCOUNTER — Inpatient Hospital Stay: Payer: Medicare Other | Attending: Hematology | Admitting: Hematology

## 2018-01-25 ENCOUNTER — Telehealth: Payer: Self-pay | Admitting: Hematology

## 2018-01-25 VITALS — BP 138/69 | HR 79 | Temp 98.3°F | Resp 18 | Ht 61.0 in | Wt 138.9 lb

## 2018-01-25 DIAGNOSIS — M810 Age-related osteoporosis without current pathological fracture: Secondary | ICD-10-CM | POA: Diagnosis not present

## 2018-01-25 DIAGNOSIS — G609 Hereditary and idiopathic neuropathy, unspecified: Secondary | ICD-10-CM | POA: Insufficient documentation

## 2018-01-25 DIAGNOSIS — D472 Monoclonal gammopathy: Secondary | ICD-10-CM

## 2018-01-25 LAB — CMP (CANCER CENTER ONLY)
ALT: 23 U/L (ref 0–44)
AST: 28 U/L (ref 15–41)
Albumin: 4 g/dL (ref 3.5–5.0)
Alkaline Phosphatase: 88 U/L (ref 38–126)
Anion gap: 11 (ref 5–15)
BUN: 17 mg/dL (ref 8–23)
CO2: 30 mmol/L (ref 22–32)
Calcium: 10.3 mg/dL (ref 8.9–10.3)
Chloride: 92 mmol/L — ABNORMAL LOW (ref 98–111)
Creatinine: 0.85 mg/dL (ref 0.44–1.00)
GFR, Est AFR Am: >60 mL/min (ref >60)
GFR, Estimated: >60 mL/min (ref >60)
Glucose, Bld: 86 mg/dL (ref 70–99)
Potassium: 3.4 mmol/L — ABNORMAL LOW (ref 3.5–5.1)
Sodium: 133 mmol/L — ABNORMAL LOW (ref 135–145)
Total Bilirubin: 0.6 mg/dL (ref 0.3–1.2)
Total Protein: 7.5 g/dL (ref 6.5–8.1)

## 2018-01-25 LAB — CBC WITH DIFFERENTIAL/PLATELET
Basophils Absolute: 0 10*3/uL (ref 0.0–0.1)
Basophils Relative: 0 %
Eosinophils Absolute: 0 10*3/uL (ref 0.0–0.5)
Eosinophils Relative: 1 %
HCT: 39.9 % (ref 34.8–46.6)
Hemoglobin: 13.3 g/dL (ref 11.6–15.9)
Lymphocytes Relative: 31 %
Lymphs Abs: 1.2 10*3/uL (ref 0.9–3.3)
MCH: 32.1 pg (ref 25.1–34.0)
MCHC: 33.5 g/dL (ref 31.5–36.0)
MCV: 95.8 fL (ref 79.5–101.0)
Monocytes Absolute: 0.7 10*3/uL (ref 0.1–0.9)
Monocytes Relative: 17 %
Neutro Abs: 2.1 10*3/uL (ref 1.5–6.5)
Neutrophils Relative %: 51 %
Platelets: 351 10*3/uL (ref 145–400)
RBC: 4.16 MIL/uL (ref 3.70–5.45)
RDW: 12.3 % (ref 11.2–14.5)
WBC: 4 10*3/uL (ref 3.9–10.3)

## 2018-01-25 LAB — SEDIMENTATION RATE: Sed Rate: 11 mm/hr (ref 0–22)

## 2018-01-25 LAB — LACTATE DEHYDROGENASE: LDH: 217 U/L — ABNORMAL HIGH (ref 98–192)

## 2018-01-25 NOTE — Telephone Encounter (Signed)
Appts scheduled AVS/Calendar printed per 8/29 los °

## 2018-01-26 LAB — KAPPA/LAMBDA LIGHT CHAINS
Kappa free light chain: 27.6 mg/L — ABNORMAL HIGH (ref 3.3–19.4)
Kappa, lambda light chain ratio: 1.47 (ref 0.26–1.65)
Lambda free light chains: 18.8 mg/L (ref 5.7–26.3)

## 2018-01-27 LAB — MULTIPLE MYELOMA PANEL, SERUM
Albumin SerPl Elph-Mcnc: 4 g/dL (ref 2.9–4.4)
Albumin/Glob SerPl: 1.4 (ref 0.7–1.7)
Alpha 1: 0.3 g/dL (ref 0.0–0.4)
Alpha2 Glob SerPl Elph-Mcnc: 0.7 g/dL (ref 0.4–1.0)
B-Globulin SerPl Elph-Mcnc: 1.2 g/dL (ref 0.7–1.3)
Gamma Glob SerPl Elph-Mcnc: 0.9 g/dL (ref 0.4–1.8)
Globulin, Total: 3 g/dL (ref 2.2–3.9)
IgA: 427 mg/dL — ABNORMAL HIGH (ref 64–422)
IgG (Immunoglobin G), Serum: 809 mg/dL (ref 700–1600)
IgM (Immunoglobulin M), Srm: 70 mg/dL (ref 26–217)
M Protein SerPl Elph-Mcnc: 0.2 g/dL — ABNORMAL HIGH
Total Protein ELP: 7 g/dL (ref 6.0–8.5)

## 2018-01-30 DIAGNOSIS — M810 Age-related osteoporosis without current pathological fracture: Secondary | ICD-10-CM | POA: Diagnosis not present

## 2018-01-30 DIAGNOSIS — D472 Monoclonal gammopathy: Secondary | ICD-10-CM | POA: Diagnosis not present

## 2018-01-30 DIAGNOSIS — G609 Hereditary and idiopathic neuropathy, unspecified: Secondary | ICD-10-CM | POA: Diagnosis not present

## 2018-01-31 LAB — UPEP/TP, 24-HR URINE
Albumin, U: 100 %
Alpha 1, Urine: 0 %
Alpha 2, Urine: 0 %
Beta, Urine: 0 %
Gamma Globulin, Urine: 0 %
Total Protein, Urine-Ur/day: 210 mg/24 hr — ABNORMAL HIGH (ref 30–150)
Total Protein, Urine: 6.9 mg/dL

## 2018-02-01 ENCOUNTER — Ambulatory Visit (HOSPITAL_COMMUNITY)
Admission: RE | Admit: 2018-02-01 | Discharge: 2018-02-01 | Disposition: A | Discharge status: Home / Self Care | Payer: Medicare Other | Source: Ambulatory Visit | Attending: Hematology | Admitting: Hematology

## 2018-02-01 DIAGNOSIS — D472 Monoclonal gammopathy: Secondary | ICD-10-CM

## 2018-02-10 DIAGNOSIS — Z23 Encounter for immunization: Secondary | ICD-10-CM | POA: Diagnosis not present

## 2018-02-15 NOTE — Progress Notes (Signed)
HEMATOLOGY/ONCOLOGY CONSULTATION NOTE  Date of Service: 02/16/2018  Patient Care Team: Burnard Bunting, MD as PCP - General (Internal Medicine) Corky Sing, PA-C as Physician Assistant (Physician Assistant)  CHIEF COMPLAINTS/PURPOSE OF CONSULTATION:  MGUS   HISTORY OF PRESENTING ILLNESS:   [name] is a wonderful 84 y.o. female who has been referred to [place] by Dr. Burnard Bunting for evaluation and management of MGUS. The pt reports that she is doing well overall.   The pt sees Dr. Margette Fast in Neurology for management of her idiopathic progressive neuropathy. She notes that her legs have burned from the knees down for the past three years, and has worsened. Her 12/27/17 NCV with EMG did not reveal evidence of an overlying lumbosacral radiculopathy. She denies ever having a diagnosis of DM. She had knee replacements in 2008 and 2011. She did not have any neuropathy symptoms around the times of surgery. She had a neck surgery with artificial disc that resolved her previous hand neuropathic symptoms. She has also had her back fused in 2011. She notes that her upper arms do hurt intermittently. She denies ever consuming alcohol or smoking.   The pt reports that she has felt more tired over the last 6 months as compared to previously, but denies new bone pains. The pt does note that she lost her husband about 6 months ago after a 76 year long relationship.   The pt notes that she took Forteo for two years for osteoporosis.   The pt notes that she "always" has frequent swelling, soreness, and abdominal discomfort. She takes multiple vitamins and minerals each day including magnesium, Vitamin C, and a Vitamin B complex  Most recent lab results 11/23/17 MMP revealed all values WNL except for 0.3 of IgA with kappa light chain specificity.   On review of systems, pt reports feeling tired, worsening peripheral neuropathy in her feet, stable abdominal discomfort, and denies new bone  pains, pain along the spine, leg swelling, and any other symptoms.   On PMHx the pt reports b/l knee surgeries in 2008 and 2011, neck surgery in 2011 with artificial disc. On Social Hx the pt reports that she has never smoked cigarettes nor consumed alcohol.   Interval History:   [name] returns today for management and evaluation of her IgA monoclonal gammopathy. The patient's last visit with [place] was on 01/25/18. The pt reports that she is doing well overall.   The pt reports that she has not had any new concerns in the brief interim. She continues to have neuropathy in her legs and is seeing Dr. Jannifer Franklin in Neurology for evaluation and management of this. She denies any new bone pains or constitutional symptoms.  Of note since the patient's last visit, pt has had a Bone Survey  completed on 02/01/18 with results revealing No lytic or destructive bone lesions identified. Scattered postsurgical changes as above.  Lab results today (02/16/18) of CBC w/diff, CMP, and Reticulocytes is as follows: all values are WNL except for Sodium at 133, Potassium at 3.4, Chloride at 92. 01/30/18 24hour UPEP revealed all values WNL except for 221m of total protein per day. 12/2917 MMP revealed all values WNL except for IgA at 427 and M Protein at 0.2 with IgA kappa specificity.  01/25/18 SFLC revealed Kappa slightly elevated at 27.6 with a normal K:L ratio.  On review of systems, pt reports stable neuropathy in her legs, stable energy levels, and denies new bone pains, new fatigue, fevers,  chills, night sweats, and any other symptoms.   MEDICAL HISTORY:  Past Medical History:  Diagnosis Date  . Arthritis   . Complication of anesthesia    Elevated blood pressure after having last Kyphoplasty; pt stated "I was given Morphine and had to stay overnight"  . DI (detrusor instability)   . Fracture of vertebra    x 3  . GERD (gastroesophageal reflux disease)   . History of hiatal hernia   . Hypertension   .  Hypothyroidism   . Menopausal symptoms   . MGUS (monoclonal gammopathy of unknown significance) 12/27/2017   IgA  . Osteoporosis   . Peripheral neuropathy 12/27/2017  . Restless leg syndrome   . Varicose veins     SURGICAL HISTORY: Past Surgical History:  Procedure Laterality Date  . ABDOMINAL HYSTERECTOMY  1992   TAH,BSO  . BLADDER SUSPENSION  2011   CRYOMESH  . CARPAL TUNNEL RELEASE Bilateral 1982  . CHOLECYSTECTOMY  1992  . COLONOSCOPY    . EYE SURGERY Bilateral    Cataract removal  . KYPHOPLASTY     X 3  . LUMBAR LAMINECTOMY WITH COFLEX 1 LEVEL N/A 12/29/2015   Procedure: L3-4 Laminectomy with Coflex;  Surgeon: Kristeen Miss, MD;  Location: [facility];  Service: Neurosurgery;  Laterality: N/A;  L3-4 Laminectomy with coflex  . OOPHORECTOMY     BSO  . REPLACEMENT TOTAL KNEE  2008,  2011   RIGHT 2008, LEFT 2011  . SPINAL FUSION  2011  . VERTEBRAL SURGERY     T-8, T-10. T-12  DR Roselee Culver    SOCIAL HISTORY: Social History   Socioeconomic History  . Marital status: Married    Spouse name: Not on file  . Number of children: 2  . Years of education: 60  . Highest education level: Not on file  Occupational History  . Occupation: Retired  Scientific laboratory technician  . Financial resource strain: Not on file  . Food insecurity:    Worry: Not on file    Inability: Not on file  . Transportation needs:    Medical: Not on file    Non-medical: Not on file  Tobacco Use  . Smoking status: Never Smoker  . Smokeless tobacco: Never Used  Substance and Sexual Activity  . Alcohol use: No  . Drug use: No  . Sexual activity: Yes    Birth control/protection: Surgical  Lifestyle  . Physical activity:    Days per week: Not on file    Minutes per session: Not on file  . Stress: Not on file  Relationships  . Social connections:    Talks on phone: Not on file    Gets together: Not on file    Attends religious service: Not on file    Active member of club or organization: Not on file     Attends meetings of clubs or organizations: Not on file    Relationship status: Not on file  . Intimate partner violence:    Fear of current or ex partner: Not on file    Emotionally abused: Not on file    Physically abused: Not on file    Forced sexual activity: Not on file  Other Topics Concern  . Not on file  Social History Narrative   Lives  alone   Caffeine use: decaf only   Right handed     FAMILY HISTORY: Family History  Problem Relation Age of Onset  . Heart disease Mother   . Heart disease Father   .  Stroke Father   . Hypertension Sister   . Diabetes Sister   . Cancer Sister        OVARIAN CA  . Hypertension Brother   . Heart disease Brother   . Diabetes Sister   . Cancer Sister        Colon  . Heart disease Brother   . Hypertension Brother     ALLERGIES:  has No Known Allergies.  MEDICATIONS:  Current Outpatient Medications  Medication Sig Dispense Refill  . ALPRAZolam (XANAX) 0.5 MG tablet Take 0.5 mg by mouth 2 (two) times daily as needed (restless legs).    Marland Kitchen aspirin 81 MG tablet Take 81 mg by mouth daily.    . baclofen (LIORESAL) 10 MG tablet Take 0.5 tablets (5 mg total) by mouth at bedtime. 15 each 2  . calcium carbonate (OS-CAL - DOSED IN MG OF ELEMENTAL CALCIUM) 1250 MG tablet Take 1 tablet by mouth daily.      . CRESTOR 5 MG tablet Take 5 mg by mouth at bedtime.     Marland Kitchen dexlansoprazole (DEXILANT) 60 MG capsule Take 60 mg by mouth daily.    . DULoxetine (CYMBALTA) 30 MG capsule Take 1 capsule (30 mg total) by mouth daily. Brand medically necessary 30 capsule 3  . levothyroxine (SYNTHROID, LEVOTHROID) 25 MCG tablet Take 25 mcg by mouth daily.      Marland Kitchen MAGNESIUM PO Take 1 tablet by mouth daily.    . metoprolol succinate (TOPROL-XL) 25 MG 24 hr tablet Take 25 mg by mouth daily.      . Multiple Vitamin (MULTIVITAMIN) tablet Take 1 tablet by mouth daily.      . NORVASC 5 MG tablet Take 1 tablet by mouth daily.    Marland Kitchen triamterene-hydrochlorothiazide  (MAXZIDE-25) 37.5-25 MG per tablet Take 1 tablet by mouth daily.      . vitamin C (ASCORBIC ACID) 500 MG tablet Take 500 mg by mouth daily.      . Vitamin D, Ergocalciferol, (DRISDOL) 50000 UNITS CAPS Take 50,000 Units by mouth every 7 (seven) days. Take on Friday     . zolpidem (AMBIEN CR) 12.5 MG CR tablet Take 12.5 mg by mouth at bedtime.     No current facility-administered medications for this visit.     REVIEW OF SYSTEMS:    A 10+ POINT REVIEW OF SYSTEMS WAS OBTAINED including neurology, dermatology, psychiatry, cardiac, respiratory, lymph, extremities, GI, GU, Musculoskeletal, constitutional, breasts, reproductive, HEENT.  All pertinent positives are noted in the HPI.  All others are negative.  PHYSICAL EXAMINATION:  . Vitals:   02/16/18 1121  BP: (!) 121/54  Pulse: 84  Resp: 17  Temp: 98.3 F (36.8 C)  SpO2: 98%   Filed Weights   02/16/18 1121  Weight: 140 lb 12.8 oz (63.9 kg)   .Body mass index is 26.6 kg/m.  GENERAL:alert, in no acute distress and comfortable SKIN: no acute rashes, no significant lesions EYES: conjunctiva are pink and non-injected, sclera anicteric OROPHARYNX: MMM, no exudates, no oropharyngeal erythema or ulceration NECK: supple, no JVD LYMPH:  no palpable lymphadenopathy in the cervical, axillary or inguinal regions LUNGS: clear to auscultation b/l with normal respiratory effort HEART: regular rate & rhythm ABDOMEN:  normoactive bowel sounds , non tender, not distended. No palpable hepatosplenomegaly.  Extremity: no pedal edema PSYCH: alert & oriented x 3 with fluent speech NEURO: no focal motor/sensory deficits   LABORATORY DATA:  I have reviewed the data as listed  . CBC Latest Ref Rng &  Units 01/25/2018 12/21/2015 03/24/2010  WBC 3.9 - 10.3 K/uL 4.0 5.1 5.3  Hemoglobin 11.6 - 15.9 g/dL 13.3 13.6 14.3  Hematocrit 34.8 - 46.6 % 39.9 42.1 41.4  Platelets 145 - 400 K/uL 351 319 288    . CMP Latest Ref Rng & Units 01/25/2018 11/23/2017  12/21/2015  Glucose 70 - 99 mg/dL 86 - 93  BUN 8 - 23 mg/dL 17 - 17  Creatinine 0.44 - 1.00 mg/dL 0.85 - 1.34(H)  Sodium 135 - 145 mmol/L 133(L) - 138  Potassium 3.5 - 5.1 mmol/L 3.4(L) - 4.1  Chloride 98 - 111 mmol/L 92(L) - 102  CO2 22 - 32 mmol/L 30 - 29  Calcium 8.9 - 10.3 mg/dL 10.3 - 9.7  Total Protein 6.5 - 8.1 g/dL 7.5 6.6 -  Total Bilirubin 0.3 - 1.2 mg/dL 0.6 - -  Alkaline Phos 38 - 126 U/L 88 - -  AST 15 - 41 U/L 28 - -  ALT 0 - 44 U/L 23 - -     RADIOGRAPHIC STUDIES: I have personally reviewed the radiological images as listed and agreed with the findings in the report. Dg Bone Survey Met  Result Date: 02/01/2018 CLINICAL DATA:  Monoclonal paraproteinemia, staging EXAM: METASTATIC BONE SURVEY COMPARISON:  None FINDINGS: Osseous mineralization appears mildly decreased diffusely. Disc prostheses in cervical spine at C5-C6 and C6-C7. Prior vertebroplasties at T7, T8 and T11. Prior lumbar fusion L4-L5. Dextroconvex lumbar scoliosis. No lytic, sclerotic, or destructive bone lesions identified. Degenerative changes of the hip joints. Prior BILATERAL knee replacements. IMPRESSION: No lytic or destructive bone lesions identified. Scattered postsurgical changes as above. Electronically Signed   By: Lavonia Dana M.D.   On: 02/01/2018 15:36    ASSESSMENT & PLAN:  84 y.o. female with   1. IgA Kappa Monoclonal gaamopathy of undetermined significance (MGUS) PLAN: -Discussed pt labwork from 01/25/18; IgA at 427, M Protein at 0.2 with IgA kappa specificity. No hypercalcemia. Normal blood counts. No abnormal renal function.  -Discussed that the 01/30/18 24 hour urine study did not reveal an M spike -Discussed the 02/01/18 Bone Survey which revealed No lytic or destructive bone lesions identified. Scattered postsurgical changes as above.  -Discussed that the pt does not have indications of multiple myeloma at this time and does not meet any of the CRAB criteria at this time.  -No indication for a  BM Bx at this time -Will see the pt back in 6 months to monitor M Protein   . Orders Placed This Encounter  Procedures  . CBC with Differential/Platelet    Standing Status:   Future    Standing Expiration Date:   03/23/2019  . CMP ([place] only)    Standing Status:   Future    Standing Expiration Date:   02/17/2019  . Multiple Myeloma Panel (SPEP&IFE w/QIG)    Standing Status:   Future    Standing Expiration Date:   02/17/2019   2. . Patient Active Problem List   Diagnosis Date Noted  . Peripheral neuropathy 12/27/2017  . MGUS (monoclonal gammopathy of unknown significance) 12/27/2017  . Lumbar stenosis with neurogenic claudication 12/29/2015  . Coronary artery disease involving native heart 03/07/2014  . Hyperlipidemia 03/07/2014  . Essential hypertension 03/07/2014  . Fracture of vertebra   . Osteoporosis   . DI (detrusor instability)   . Menopausal symptoms   -mx of chronic co-morbids per PCP   RTC with Dr Irene Limbo in 6 months with labs (Labs 1 week prior to  visit)   All of the patients questions were answered with apparent satisfaction. The patient knows to call the clinic with any problems, questions or concerns.  The total time spent in the appt was 25 minutes and more than 50% was on counseling and direct patient cares.    Sullivan Lone MD MS AAHIVMS [facility] Hematology/Oncology Physician [facility]  (Office):       [phone] (Work cell):  [phone] (Fax):           [phone]  02/16/2018 12:06 PM  I, Baldwin [place], am acting as a scribe for Dr. Irene Limbo  .I have reviewed the above documentation for accuracy and completeness, and I agree with the above. Brunetta Genera MD

## 2018-02-16 ENCOUNTER — Encounter: Payer: Self-pay | Admitting: Hematology

## 2018-02-16 ENCOUNTER — Inpatient Hospital Stay: Payer: Medicare Other | Attending: Hematology | Admitting: Hematology

## 2018-02-16 ENCOUNTER — Telehealth: Payer: Self-pay | Admitting: Hematology

## 2018-02-16 VITALS — BP 121/54 | HR 84 | Temp 98.3°F | Resp 17 | Ht 61.0 in | Wt 140.8 lb

## 2018-02-16 DIAGNOSIS — D472 Monoclonal gammopathy: Secondary | ICD-10-CM | POA: Diagnosis not present

## 2018-02-16 NOTE — Telephone Encounter (Signed)
Scheduled appt per 9/20 los - gave patient AVS and calender per los.   

## 2018-02-16 NOTE — Telephone Encounter (Signed)
Printed medical records for patient, Release ID: [id number]

## 2018-02-22 NOTE — Progress Notes (Signed)
Cardiology Office Note:    Date:  02/23/2018  ID:  [name], DOB April 28, 1934, MRN [id number]  PCP:  Burnard Bunting, MD  Cardiologist:  No primary care provider on file.   Referring MD: Burnard Bunting, MD   Chief Complaint  Patient presents with  . Coronary Artery Disease    History of Present Illness:    [name] is a 84 y.o. female with a hx of who is here for follow-up of nonobstructive coronary disease, hypertension, and hyperlipidemia.  A Hoobler died in 08/08/16.  She is still grieving.  She has had other issues including a fractured left lower extremity, and an injury to her left foot.  She has not had chest pain, orthopnea, PND, or other complaints.  She did develop neuropathy in her left lower extremity and work-up has led to the identification of a monoclonal immunoglobulin spike.  No diagnosis has been made at this point.  Past Medical History:  Diagnosis Date  . Arthritis   . Complication of anesthesia    Elevated blood pressure after having last Kyphoplasty; pt stated "I was given Morphine and had to stay overnight"  . DI (detrusor instability)   . Fracture of vertebra    x 3  . GERD (gastroesophageal reflux disease)   . History of hiatal hernia   . Hypertension   . Hypothyroidism   . Menopausal symptoms   . MGUS (monoclonal gammopathy of unknown significance) 12/27/2017   IgA  . Osteoporosis   . Peripheral neuropathy 12/27/2017  . Restless leg syndrome   . Varicose veins     Past Surgical History:  Procedure Laterality Date  . ABDOMINAL HYSTERECTOMY  1992   TAH,BSO  . BLADDER SUSPENSION  2011   CRYOMESH  . CARPAL TUNNEL RELEASE Bilateral 1982  . CHOLECYSTECTOMY  1992  . COLONOSCOPY    . EYE SURGERY Bilateral    Cataract removal  . KYPHOPLASTY     X 3  . LUMBAR LAMINECTOMY WITH COFLEX 1 LEVEL N/A 12/29/2015   Procedure: L3-4 Laminectomy with Coflex;  Surgeon: Kristeen Miss, MD;  Location: [facility];  Service: Neurosurgery;   Laterality: N/A;  L3-4 Laminectomy with coflex  . OOPHORECTOMY     BSO  . REPLACEMENT TOTAL KNEE  2008,  2011   RIGHT 2008, LEFT 2011  . SPINAL FUSION  2011  . VERTEBRAL SURGERY     T-8, T-10. T-12  DR Roselee Culver    Current Medications: Current Meds  Medication Sig  . ALPRAZolam (XANAX) 0.5 MG tablet Take 0.5 mg by mouth 2 (two) times daily as needed (restless legs).  Marland Kitchen aspirin 81 MG tablet Take 81 mg by mouth daily.  . baclofen (LIORESAL) 10 MG tablet Take 0.5 tablets (5 mg total) by mouth at bedtime.  . calcium carbonate (OS-CAL - DOSED IN MG OF ELEMENTAL CALCIUM) 1250 MG tablet Take 1 tablet by mouth daily.    . CRESTOR 5 MG tablet Take 5 mg by mouth at bedtime.   Marland Kitchen dexlansoprazole (DEXILANT) 60 MG capsule Take 60 mg by mouth daily.  . DULoxetine (CYMBALTA) 30 MG capsule Take 1 capsule (30 mg total) by mouth daily. Brand medically necessary  . levothyroxine (SYNTHROID, LEVOTHROID) 25 MCG tablet Take 25 mcg by mouth daily.    Marland Kitchen MAGNESIUM PO Take 1 tablet by mouth daily.  . metoprolol succinate (TOPROL-XL) 25 MG 24 hr tablet Take 25 mg by mouth daily.    . Multiple Vitamin (MULTIVITAMIN) tablet Take 1  tablet by mouth daily.    . NORVASC 5 MG tablet Take 1 tablet by mouth daily.  Marland Kitchen triamterene-hydrochlorothiazide (MAXZIDE-25) 37.5-25 MG per tablet Take 1 tablet by mouth daily.    . vitamin C (ASCORBIC ACID) 500 MG tablet Take 500 mg by mouth daily.    . Vitamin D, Ergocalciferol, (DRISDOL) 50000 UNITS CAPS Take 50,000 Units by mouth every 7 (seven) days. Take on Friday   . zolpidem (AMBIEN CR) 12.5 MG CR tablet Take 12.5 mg by mouth at bedtime.     Allergies:   Patient has no known allergies.   Social History   Socioeconomic History  . Marital status: Married    Spouse name: Not on file  . Number of children: 2  . Years of education: 12  . Highest education level: Not on file  Occupational History  . Occupation: Retired  Scientific laboratory technician  . Financial resource strain: Not on file    . Food insecurity:    Worry: Not on file    Inability: Not on file  . Transportation needs:    Medical: Not on file    Non-medical: Not on file  Tobacco Use  . Smoking status: Never Smoker  . Smokeless tobacco: Never Used  Substance and Sexual Activity  . Alcohol use: No  . Drug use: No  . Sexual activity: Yes    Birth control/protection: Surgical  Lifestyle  . Physical activity:    Days per week: Not on file    Minutes per session: Not on file  . Stress: Not on file  Relationships  . Social connections:    Talks on phone: Not on file    Gets together: Not on file    Attends religious service: Not on file    Active member of club or organization: Not on file    Attends meetings of clubs or organizations: Not on file    Relationship status: Not on file  Other Topics Concern  . Not on file  Social History Narrative   Lives  alone   Caffeine use: decaf only   Right handed      Family History: The patient's family history includes Cancer in her sister and sister; Diabetes in her sister and sister; Heart disease in her brother, brother, father, and mother; Hypertension in her brother, brother, and sister; Stroke in her father.  ROS:   Please see the history of present illness.    Joint swelling, headaches, easy bruising, back pain, muscle pain, peripheral neuropathy.  All other systems reviewed and are negative..  EKGs/Labs/Other Studies Reviewed:    The following studies were reviewed today: No new data  EKG:  EKG is  ordered today.  The ekg ordered today demonstrates left axis deviation, sinus rhythm, low voltage, QS pattern V1 and V2.  Tracing is unchanged from prior  Recent Labs: 01/25/2018: ALT 23; BUN 17; Creatinine 0.85; Hemoglobin 13.3; Platelets 351; Potassium 3.4; Sodium 133  Recent Lipid Panel No results found for: CHOL, TRIG, HDL, CHOLHDL, VLDL, LDLCALC, LDLDIRECT  Physical Exam:    VS:  BP 122/86   Pulse 82   Ht 5\' 1"  (1.549 m)   Wt 139 lb 3.2 oz  (63.1 kg)   BMI 26.30 kg/m     Wt Readings from Last 3 Encounters:  02/23/18 139 lb 3.2 oz (63.1 kg)  02/16/18 140 lb 12.8 oz (63.9 kg)  01/25/18 138 lb 14.4 oz (63 kg)     GEN:  Well nourished, well developed in  no acute distress HEENT: Normal NECK: No JVD. LYMPHATICS: No lymphadenopathy CARDIAC: RRR, no murmur, no gallop, no edema. VASCULAR: 2+ bilateral radial pulses.  No bruits. RESPIRATORY:  Clear to auscultation without rales, wheezing or rhonchi  ABDOMEN: Soft, non-tender, non-distended, No pulsatile mass, MUSCULOSKELETAL: No deformity  SKIN: Warm and dry NEUROLOGIC:  Alert and oriented x 3 PSYCHIATRIC:  Normal affect   ASSESSMENT:    1. Coronary artery disease involving native coronary artery of native heart without angina pectoris   2. Essential hypertension   3. Other hyperlipidemia    PLAN:    In order of problems listed above:  1. Nonobstructive coronary disease, asymptomatic. 2. Blood pressure target 130/80 mmHg or less. 3. LDL target less than 70.  Continue low-dose statin therapy.  Aerobic activity with clinical follow-up in 1 year.  She is to call if any symptoms suggestive of ischemic heart disease or heart failure.  Significant time spent discussing identification of symptoms.   Medication Adjustments/Labs and Tests Ordered: Current medicines are reviewed at length with the patient today.  Concerns regarding medicines are outlined above.  Orders Placed This Encounter  Procedures  . EKG 12-Lead   No orders of the defined types were placed in this encounter.   Patient Instructions  Medication Instructions:  Your physician recommends that you continue on your current medications as directed. Please refer to the Current Medication list given to you today.  Labwork: None  Testing/Procedures: None  Follow-Up: Your physician wants you to follow-up in: 1 year with Dr. Tamala Julian.  You will receive a reminder letter in the mail two months in advance. If  you don't receive a letter, please call our office to schedule the follow-up appointment.   Any Other Special Instructions Will Be Listed Below (If Applicable).     If you need a refill on your cardiac medications before your next appointment, please call your pharmacy.      Signed, Sinclair Grooms, MD  02/23/2018 1:41 PM    [place]

## 2018-02-23 ENCOUNTER — Ambulatory Visit (INDEPENDENT_AMBULATORY_CARE_PROVIDER_SITE_OTHER): Payer: Medicare Other | Admitting: Interventional Cardiology

## 2018-02-23 ENCOUNTER — Encounter: Payer: Self-pay | Admitting: Interventional Cardiology

## 2018-02-23 VITALS — BP 122/86 | HR 82 | Ht 61.0 in | Wt 139.2 lb

## 2018-02-23 DIAGNOSIS — I10 Essential (primary) hypertension: Secondary | ICD-10-CM | POA: Diagnosis not present

## 2018-02-23 DIAGNOSIS — E7849 Other hyperlipidemia: Secondary | ICD-10-CM

## 2018-02-23 DIAGNOSIS — I2510 Atherosclerotic heart disease of native coronary artery without angina pectoris: Secondary | ICD-10-CM

## 2018-02-23 NOTE — Patient Instructions (Signed)

## 2018-02-25 ENCOUNTER — Other Ambulatory Visit: Payer: Self-pay | Admitting: Neurology

## 2018-03-07 ENCOUNTER — Other Ambulatory Visit: Payer: Self-pay | Admitting: Neurology

## 2018-03-18 ENCOUNTER — Other Ambulatory Visit: Payer: Self-pay | Admitting: Neurology

## 2018-03-23 DIAGNOSIS — M810 Age-related osteoporosis without current pathological fracture: Secondary | ICD-10-CM | POA: Diagnosis not present

## 2018-03-23 DIAGNOSIS — I10 Essential (primary) hypertension: Secondary | ICD-10-CM | POA: Diagnosis not present

## 2018-03-23 DIAGNOSIS — E038 Other specified hypothyroidism: Secondary | ICD-10-CM | POA: Diagnosis not present

## 2018-03-23 DIAGNOSIS — R82998 Other abnormal findings in urine: Secondary | ICD-10-CM | POA: Diagnosis not present

## 2018-03-26 DIAGNOSIS — M5116 Intervertebral disc disorders with radiculopathy, lumbar region: Secondary | ICD-10-CM | POA: Diagnosis not present

## 2018-03-26 DIAGNOSIS — M5416 Radiculopathy, lumbar region: Secondary | ICD-10-CM | POA: Diagnosis not present

## 2018-03-26 DIAGNOSIS — M4726 Other spondylosis with radiculopathy, lumbar region: Secondary | ICD-10-CM | POA: Diagnosis not present

## 2018-04-03 DIAGNOSIS — Z124 Encounter for screening for malignant neoplasm of cervix: Secondary | ICD-10-CM | POA: Diagnosis not present

## 2018-04-03 DIAGNOSIS — Z1231 Encounter for screening mammogram for malignant neoplasm of breast: Secondary | ICD-10-CM | POA: Diagnosis not present

## 2018-04-04 DIAGNOSIS — M1990 Unspecified osteoarthritis, unspecified site: Secondary | ICD-10-CM | POA: Diagnosis not present

## 2018-04-04 DIAGNOSIS — I129 Hypertensive chronic kidney disease with stage 1 through stage 4 chronic kidney disease, or unspecified chronic kidney disease: Secondary | ICD-10-CM | POA: Diagnosis not present

## 2018-04-04 DIAGNOSIS — N183 Chronic kidney disease, stage 3 (moderate): Secondary | ICD-10-CM | POA: Diagnosis not present

## 2018-04-04 DIAGNOSIS — I10 Essential (primary) hypertension: Secondary | ICD-10-CM | POA: Diagnosis not present

## 2018-04-04 DIAGNOSIS — M810 Age-related osteoporosis without current pathological fracture: Secondary | ICD-10-CM | POA: Diagnosis not present

## 2018-04-04 DIAGNOSIS — E7849 Other hyperlipidemia: Secondary | ICD-10-CM | POA: Diagnosis not present

## 2018-04-04 DIAGNOSIS — I2510 Atherosclerotic heart disease of native coronary artery without angina pectoris: Secondary | ICD-10-CM | POA: Diagnosis not present

## 2018-04-04 DIAGNOSIS — M5380 Other specified dorsopathies, site unspecified: Secondary | ICD-10-CM | POA: Diagnosis not present

## 2018-04-04 DIAGNOSIS — E038 Other specified hypothyroidism: Secondary | ICD-10-CM | POA: Diagnosis not present

## 2018-04-04 DIAGNOSIS — Z0000 Encounter for general adult medical examination without abnormal findings: Secondary | ICD-10-CM | POA: Diagnosis not present

## 2018-04-04 DIAGNOSIS — L659 Nonscarring hair loss, unspecified: Secondary | ICD-10-CM | POA: Diagnosis not present

## 2018-04-04 DIAGNOSIS — Z6825 Body mass index (BMI) 25.0-25.9, adult: Secondary | ICD-10-CM | POA: Diagnosis not present

## 2018-04-11 DIAGNOSIS — Z1212 Encounter for screening for malignant neoplasm of rectum: Secondary | ICD-10-CM | POA: Diagnosis not present

## 2018-04-12 ENCOUNTER — Telehealth: Payer: Self-pay | Admitting: Neurology

## 2018-04-12 MED ORDER — BACLOFEN 10 MG PO TABS: ORAL_TABLET | ORAL | 2 refills | Status: DC

## 2018-04-12 NOTE — Addendum Note (Signed)
Addended by: France Ravens I on: 04/12/2018 04:49 PM   Modules accepted: Orders

## 2018-04-12 NOTE — Telephone Encounter (Signed)
Baclofen escribed as requested/fim 

## 2018-04-12 NOTE — Telephone Encounter (Signed)
Pt Is asking for a new prescription be sent to  [id number] - [place], [place] [phone] (Phone) [phone] (Fax)    for her baclofen (LIORESAL) 10 MG tablet Pt would like to know if this can be done for a 90 day prescription. Please call

## 2018-04-19 DIAGNOSIS — L821 Other seborrheic keratosis: Secondary | ICD-10-CM | POA: Diagnosis not present

## 2018-04-19 DIAGNOSIS — D0462 Carcinoma in situ of skin of left upper limb, including shoulder: Secondary | ICD-10-CM | POA: Diagnosis not present

## 2018-04-19 DIAGNOSIS — Z85828 Personal history of other malignant neoplasm of skin: Secondary | ICD-10-CM | POA: Diagnosis not present

## 2018-04-19 DIAGNOSIS — L578 Other skin changes due to chronic exposure to nonionizing radiation: Secondary | ICD-10-CM | POA: Diagnosis not present

## 2018-04-19 DIAGNOSIS — D0471 Carcinoma in situ of skin of right lower limb, including hip: Secondary | ICD-10-CM | POA: Diagnosis not present

## 2018-04-19 DIAGNOSIS — D485 Neoplasm of uncertain behavior of skin: Secondary | ICD-10-CM | POA: Diagnosis not present

## 2018-04-19 DIAGNOSIS — L570 Actinic keratosis: Secondary | ICD-10-CM | POA: Diagnosis not present

## 2018-05-14 DIAGNOSIS — H6123 Impacted cerumen, bilateral: Secondary | ICD-10-CM | POA: Diagnosis not present

## 2018-05-14 DIAGNOSIS — H903 Sensorineural hearing loss, bilateral: Secondary | ICD-10-CM | POA: Diagnosis not present

## 2018-05-15 ENCOUNTER — Other Ambulatory Visit: Payer: Self-pay | Admitting: Otolaryngology

## 2018-05-15 DIAGNOSIS — H9312 Tinnitus, left ear: Secondary | ICD-10-CM

## 2018-05-15 DIAGNOSIS — H6123 Impacted cerumen, bilateral: Secondary | ICD-10-CM

## 2018-05-15 DIAGNOSIS — H903 Sensorineural hearing loss, bilateral: Secondary | ICD-10-CM

## 2018-05-17 DIAGNOSIS — B351 Tinea unguium: Secondary | ICD-10-CM | POA: Diagnosis not present

## 2018-05-17 DIAGNOSIS — G609 Hereditary and idiopathic neuropathy, unspecified: Secondary | ICD-10-CM | POA: Diagnosis not present

## 2018-05-17 DIAGNOSIS — L84 Corns and callosities: Secondary | ICD-10-CM | POA: Diagnosis not present

## 2018-05-17 DIAGNOSIS — M79676 Pain in unspecified toe(s): Secondary | ICD-10-CM | POA: Diagnosis not present

## 2018-05-21 DIAGNOSIS — R05 Cough: Secondary | ICD-10-CM | POA: Diagnosis not present

## 2018-05-21 DIAGNOSIS — Z6825 Body mass index (BMI) 25.0-25.9, adult: Secondary | ICD-10-CM | POA: Diagnosis not present

## 2018-05-21 DIAGNOSIS — J069 Acute upper respiratory infection, unspecified: Secondary | ICD-10-CM | POA: Diagnosis not present

## 2018-05-21 DIAGNOSIS — I10 Essential (primary) hypertension: Secondary | ICD-10-CM | POA: Diagnosis not present

## 2018-05-29 ENCOUNTER — Ambulatory Visit
Admission: RE | Admit: 2018-05-29 | Discharge: 2018-05-29 | Disposition: A | Discharge status: Home / Self Care | Payer: Medicare Other | Source: Ambulatory Visit | Attending: Otolaryngology | Admitting: Otolaryngology

## 2018-05-29 DIAGNOSIS — H9312 Tinnitus, left ear: Secondary | ICD-10-CM

## 2018-05-29 DIAGNOSIS — H6123 Impacted cerumen, bilateral: Secondary | ICD-10-CM

## 2018-05-29 DIAGNOSIS — H9193 Unspecified hearing loss, bilateral: Secondary | ICD-10-CM | POA: Diagnosis not present

## 2018-05-29 DIAGNOSIS — H903 Sensorineural hearing loss, bilateral: Secondary | ICD-10-CM

## 2018-05-29 MED ORDER — GADOBENATE DIMEGLUMINE 529 MG/ML IV SOLN
12.0000 mL | Freq: Once | INTRAVENOUS | Status: AC | PRN
  Administered 2018-05-29: 12 mL via INTRAVENOUS

## 2018-06-05 DIAGNOSIS — M79671 Pain in right foot: Secondary | ICD-10-CM | POA: Diagnosis not present

## 2018-06-05 DIAGNOSIS — D2371 Other benign neoplasm of skin of right lower limb, including hip: Secondary | ICD-10-CM | POA: Diagnosis not present

## 2018-06-06 DIAGNOSIS — I10 Essential (primary) hypertension: Secondary | ICD-10-CM | POA: Diagnosis not present

## 2018-06-06 DIAGNOSIS — Z6825 Body mass index (BMI) 25.0-25.9, adult: Secondary | ICD-10-CM | POA: Diagnosis not present

## 2018-06-06 DIAGNOSIS — M5380 Other specified dorsopathies, site unspecified: Secondary | ICD-10-CM | POA: Diagnosis not present

## 2018-06-06 DIAGNOSIS — R51 Headache: Secondary | ICD-10-CM | POA: Diagnosis not present

## 2018-06-06 DIAGNOSIS — N183 Chronic kidney disease, stage 3 (moderate): Secondary | ICD-10-CM | POA: Diagnosis not present

## 2018-06-06 DIAGNOSIS — M1990 Unspecified osteoarthritis, unspecified site: Secondary | ICD-10-CM | POA: Diagnosis not present

## 2018-06-06 DIAGNOSIS — R000 Tachycardia, unspecified: Secondary | ICD-10-CM | POA: Diagnosis not present

## 2018-06-06 DIAGNOSIS — M810 Age-related osteoporosis without current pathological fracture: Secondary | ICD-10-CM | POA: Diagnosis not present

## 2018-06-06 DIAGNOSIS — I2510 Atherosclerotic heart disease of native coronary artery without angina pectoris: Secondary | ICD-10-CM | POA: Diagnosis not present

## 2018-06-06 DIAGNOSIS — E7849 Other hyperlipidemia: Secondary | ICD-10-CM | POA: Diagnosis not present

## 2018-06-06 DIAGNOSIS — E038 Other specified hypothyroidism: Secondary | ICD-10-CM | POA: Diagnosis not present

## 2018-06-06 DIAGNOSIS — I129 Hypertensive chronic kidney disease with stage 1 through stage 4 chronic kidney disease, or unspecified chronic kidney disease: Secondary | ICD-10-CM | POA: Diagnosis not present

## 2018-06-08 ENCOUNTER — Ambulatory Visit (INDEPENDENT_AMBULATORY_CARE_PROVIDER_SITE_OTHER): Payer: Medicare Other | Admitting: Neurology

## 2018-06-08 ENCOUNTER — Encounter: Payer: Self-pay | Admitting: Neurology

## 2018-06-08 VITALS — BP 113/79 | HR 106 | Ht 61.0 in | Wt 137.0 lb

## 2018-06-08 DIAGNOSIS — G603 Idiopathic progressive neuropathy: Secondary | ICD-10-CM

## 2018-06-08 MED ORDER — BACLOFEN 10 MG PO TABS: ORAL_TABLET | ORAL | 2 refills | Status: DC

## 2018-06-08 MED ORDER — DULOXETINE HCL 60 MG PO CPEP: 60.0000 mg | ORAL_CAPSULE | Freq: Every day | ORAL | 2 refills | Status: DC

## 2018-06-08 NOTE — Progress Notes (Signed)
Reason for visit: Peripheral neuropathy  [name] is an 85 y.o. female  History of present illness:  [name] is an 85 year old right-handed white female with a history of a peripheral neuropathy.  The patient has a history of polio with a right foot drop, she does not use an AFO brace.  The patient has been on Cymbalta for discomfort with the neuropathy, she has good days and bad days with this, occasionally she may have a squeezing sensation around the ankle and burning in the feet.  Overall, the Cymbalta has helped.  The baclofen has been very useful for controlling her nocturnal leg cramps.  The patient is followed through oncology, it is possible that they may need to do a bone marrow biopsy in the near future.  The patient does have some gait instability, she may stumble on occasion.  She recently has noted a significant increase in heart rate, she has been seen through her primary care physician and is to be set up for a heart monitor study.  The patient returns to this office for an evaluation.  Past Medical History:  Diagnosis Date  . Arthritis   . Complication of anesthesia    Elevated blood pressure after having last Kyphoplasty; pt stated "I was given Morphine and had to stay overnight"  . DI (detrusor instability)   . Fracture of vertebra    x 3  . GERD (gastroesophageal reflux disease)   . History of hiatal hernia   . Hypertension   . Hypothyroidism   . Menopausal symptoms   . MGUS (monoclonal gammopathy of unknown significance) 12/27/2017   IgA  . Osteoporosis   . Peripheral neuropathy 12/27/2017  . Restless leg syndrome   . Varicose veins     Past Surgical History:  Procedure Laterality Date  . ABDOMINAL HYSTERECTOMY  1992   TAH,BSO  . BLADDER SUSPENSION  2011   CRYOMESH  . CARPAL TUNNEL RELEASE Bilateral 1982  . CHOLECYSTECTOMY  1992  . COLONOSCOPY    . EYE SURGERY Bilateral    Cataract removal  . KYPHOPLASTY     X 3  . LUMBAR LAMINECTOMY WITH  COFLEX 1 LEVEL N/A 12/29/2015   Procedure: L3-4 Laminectomy with Coflex;  Surgeon: Yvonne Miss, MD;  Location: [facility];  Service: Neurosurgery;  Laterality: N/A;  L3-4 Laminectomy with coflex  . OOPHORECTOMY     BSO  . REPLACEMENT TOTAL KNEE  2008,  2011   RIGHT 2008, LEFT 2011  . SPINAL FUSION  2011  . VERTEBRAL SURGERY     T-8, T-10. T-12  DR [name]    Family History  Problem Relation Age of Onset  . Heart disease Mother   . Heart disease Father   . Stroke Father   . Hypertension Sister   . Diabetes Sister   . Cancer Sister        OVARIAN CA  . Hypertension Brother   . Heart disease Brother   . Diabetes Sister   . Cancer Sister        Colon  . Heart disease Brother   . Hypertension Brother     Social history:  reports that she has never smoked. She has never used smokeless tobacco. She reports that she does not drink alcohol or use drugs.   No Known Allergies  Medications:  Prior to Admission medications   Medication Sig Start Date End Date Taking? Authorizing Provider  ALPRAZolam Duanne Moron) 0.5 MG tablet Take 0.5 mg by  mouth 2 (two) times daily as needed (restless legs).   Yes [provider]  aspirin 81 MG tablet Take 81 mg by mouth daily.   Yes [provider]  baclofen (LIORESAL) 10 MG tablet TAKE 1/2 TABLET (5 MG TOTAL) BY MOUTH AT BEDTIME. 06/08/18  Yes Kathrynn Ducking, MD  calcium carbonate (OS-CAL - DOSED IN MG OF ELEMENTAL CALCIUM) 1250 MG tablet Take 1 tablet by mouth daily.     Yes [provider]  CRESTOR 5 MG tablet Take 5 mg by mouth at bedtime.  02/19/14  Yes [provider]  dexlansoprazole (DEXILANT) 60 MG capsule Take 60 mg by mouth daily.   Yes [provider]  DULoxetine (CYMBALTA) 60 MG capsule Take 1 capsule (60 mg total) by mouth daily. Brand medically necessary 06/08/18  Yes Kathrynn Ducking, MD  levothyroxine (SYNTHROID, LEVOTHROID) 25 MCG tablet Take 25 mcg by mouth daily.     Yes [provider]  MAGNESIUM PO Take 1 tablet by mouth daily.   Yes [provider]  metoprolol succinate (TOPROL-XL) 25 MG 24 hr tablet Take 25 mg by mouth daily.     Yes [provider]  Multiple Vitamin (MULTIVITAMIN) tablet Take 1 tablet by mouth daily.     Yes [provider]  NORVASC 5 MG tablet Take 1 tablet by mouth daily. 02/02/15  Yes [provider]  triamterene-hydrochlorothiazide (MAXZIDE-25) 37.5-25 MG per tablet Take 1 tablet by mouth daily.     Yes [provider]  vitamin C (ASCORBIC ACID) 500 MG tablet Take 500 mg by mouth daily.     Yes [provider]  Vitamin D, Ergocalciferol, (DRISDOL) 50000 UNITS CAPS Take 50,000 Units by mouth every 7 (seven) days. Take on Friday    Yes [provider]  zolpidem (AMBIEN CR) 12.5 MG CR tablet Take 12.5 mg by mouth at bedtime.   Yes [provider]    ROS:  Out of a complete 14 system review of symptoms, the patient complains only of the following symptoms, and all other reviewed systems are negative.  Fatigue Ringing in the ears Shortness of breath Easy bruising, easy bleeding Joint pain, muscle cramps, aching muscles Headache, dizziness Restless legs  Blood pressure 113/79, pulse (!) 106, height '5\' 1"'$  (1.549 m), weight 137 lb (62.1 kg).  Physical Exam  General: The patient is alert and cooperative at the time of the examination.  Skin: No significant peripheral edema is noted.   Neurologic Exam  Mental status: The patient is alert and oriented x 3 at the time of the examination. The patient has apparent normal recent and remote memory, with an apparently normal attention span and concentration ability.   Cranial nerves: Facial symmetry is present. Speech is normal, no aphasia or dysarthria is noted. Extraocular movements are full. Visual fields are full.  Motor: The patient has good strength in all 4 extremities, with exception some slight weakness of  the intrinsic muscles of the left hand, and a right foot drop.  Sensory examination: Soft touch sensation is symmetric on the face, arms, and legs.  Coordination: The patient has good finger-nose-finger and heel-to-shin bilaterally.  Gait and station: The patient has a normal gait. Tandem gait is normal. Romberg is negative. No drift is seen.  Reflexes: Deep tendon reflexes are symmetric.   Assessment/Plan:  1.  Peripheral neuropathy  2.  Nocturnal leg cramps  3.  IgA monoclonal antibody  The patient will continue the Cymbalta and  the baclofen, prescriptions for these medications were sent in.  The patient will follow-up through this office in 6 months.  The patient will contact our office if she believes that she needs a medication adjustment for her neuropathy pain.  Jill Alexanders MD 06/08/2018 11:48 AM  [facility] [address] [place], [place] [address]  Phone [phone] Fax [phone]

## 2018-06-13 ENCOUNTER — Telehealth: Payer: Self-pay | Admitting: *Deleted

## 2018-06-13 NOTE — Telephone Encounter (Signed)
REFERRAL SENT TO SCHEDULING AND NOTES ON FILE FROM DR. RICHARD ARONSON [phone].

## 2018-06-26 ENCOUNTER — Telehealth: Payer: Self-pay | Admitting: Hematology & Oncology

## 2018-06-26 NOTE — Telephone Encounter (Signed)
lmom to inform pt of lab/ ov appt with Dr Marin Olp 06/29/18 at 1 pm per sch msg.

## 2018-06-29 ENCOUNTER — Inpatient Hospital Stay: Payer: Medicare Other | Attending: Hematology & Oncology | Admitting: Hematology & Oncology

## 2018-06-29 ENCOUNTER — Other Ambulatory Visit: Payer: Self-pay

## 2018-06-29 ENCOUNTER — Encounter: Payer: Self-pay | Admitting: Hematology & Oncology

## 2018-06-29 ENCOUNTER — Inpatient Hospital Stay: Payer: Medicare Other

## 2018-06-29 VITALS — BP 120/58 | HR 70 | Temp 98.2°F | Resp 18 | Ht 61.0 in | Wt 137.8 lb

## 2018-06-29 DIAGNOSIS — Z7982 Long term (current) use of aspirin: Secondary | ICD-10-CM | POA: Insufficient documentation

## 2018-06-29 DIAGNOSIS — D508 Other iron deficiency anemias: Secondary | ICD-10-CM

## 2018-06-29 DIAGNOSIS — D472 Monoclonal gammopathy: Secondary | ICD-10-CM | POA: Diagnosis not present

## 2018-06-29 DIAGNOSIS — R51 Headache: Secondary | ICD-10-CM | POA: Diagnosis not present

## 2018-06-29 DIAGNOSIS — Z79899 Other long term (current) drug therapy: Secondary | ICD-10-CM | POA: Diagnosis not present

## 2018-06-29 DIAGNOSIS — G603 Idiopathic progressive neuropathy: Secondary | ICD-10-CM | POA: Diagnosis not present

## 2018-06-29 DIAGNOSIS — I10 Essential (primary) hypertension: Secondary | ICD-10-CM

## 2018-06-29 LAB — CMP (CANCER CENTER ONLY)
ALT: 130 U/L — ABNORMAL HIGH (ref 0–44)
AST: 123 U/L — ABNORMAL HIGH (ref 15–41)
Albumin: 4.4 g/dL (ref 3.5–5.0)
Alkaline Phosphatase: 101 U/L (ref 38–126)
Anion gap: 6 (ref 5–15)
BUN: 21 mg/dL (ref 8–23)
CO2: 33 mmol/L — ABNORMAL HIGH (ref 22–32)
Calcium: 10.3 mg/dL (ref 8.9–10.3)
Chloride: 99 mmol/L (ref 98–111)
Creatinine: 0.95 mg/dL (ref 0.44–1.00)
GFR, Est AFR Am: >60 mL/min (ref >60)
GFR, Estimated: 56 mL/min — ABNORMAL LOW (ref >60)
Glucose, Bld: 172 mg/dL — ABNORMAL HIGH (ref 70–99)
Potassium: 4.4 mmol/L (ref 3.5–5.1)
Sodium: 138 mmol/L (ref 135–145)
Total Bilirubin: 0.5 mg/dL (ref 0.3–1.2)
Total Protein: 6.8 g/dL (ref 6.5–8.1)

## 2018-06-29 LAB — CBC WITH DIFFERENTIAL/PLATELET
Abs Immature Granulocytes: 0.01 10*3/uL (ref 0.00–0.07)
Basophils Absolute: 0 10*3/uL (ref 0.0–0.1)
Basophils Relative: 0 %
Eosinophils Absolute: 0.1 10*3/uL (ref 0.0–0.5)
Eosinophils Relative: 3 %
HCT: 42.8 % (ref 36.0–46.0)
Hemoglobin: 13.8 g/dL (ref 12.0–15.0)
Immature Granulocytes: 0 %
Lymphocytes Relative: 21 %
Lymphs Abs: 1 10*3/uL (ref 0.7–4.0)
MCH: 33.1 pg (ref 26.0–34.0)
MCHC: 32.2 g/dL (ref 30.0–36.0)
MCV: 102.6 fL — ABNORMAL HIGH (ref 80.0–100.0)
Monocytes Absolute: 0.7 10*3/uL (ref 0.1–1.0)
Monocytes Relative: 14 %
Neutro Abs: 2.9 10*3/uL (ref 1.7–7.7)
Neutrophils Relative %: 62 %
Platelets: 320 10*3/uL (ref 150–400)
RBC: 4.17 MIL/uL (ref 3.87–5.11)
RDW: 13.9 % (ref 11.5–15.5)
WBC: 4.6 10*3/uL (ref 4.0–10.5)
nRBC: 0 % (ref 0.0–0.2)

## 2018-06-29 NOTE — Progress Notes (Signed)
Hematology and Oncology Follow Up Visit  [name] [id number] 1933-12-31 85 y.o. 06/29/2018   Principle Diagnosis:   IgA Kappa MGUS  Current Therapy:    Observation     Interim History:  [name] is back for her first office visit.  She lives in [place].  As such, she wanted to transfer her care to the [facility] center.  This is somewhat easier for her.  She saw Dr. Irene Limbo initially.  As always, his work-up was incredibly thorough.  He did not find any evidence of myeloma.  She had a 24-hour urine done.  There is no monoclonal protein in her urine.  She has this monoclonal gammopathy.  She has this neuropathy.  I am sure that the neurologist is thinking that there might be a connection.  She is now on Cymbalta.  She has been taking quite a bit of Tylenol for a headache.  Her liver tests are elevated today.  I told her to stop the Tylenol.  We will have to recheck her liver function studies in 10 days.  She has had no fever.  She has had no bleeding.  There has been no change in bowel or bladder habits.  She has had no cough.  She is on quite a few medications.  Overall, her performance status is ECOG 1-2.  Medications:  Current Outpatient Medications:  .  ALPRAZolam (XANAX) 0.5 MG tablet, Take 0.5 mg by mouth 2 (two) times daily as needed (restless legs)., Disp: , Rfl:  .  aspirin 81 MG tablet, Take 81 mg by mouth daily., Disp: , Rfl:  .  baclofen (LIORESAL) 10 MG tablet, TAKE 1/2 TABLET (5 MG TOTAL) BY MOUTH AT BEDTIME., Disp: 45 tablet, Rfl: 2 .  calcium carbonate (OS-CAL - DOSED IN MG OF ELEMENTAL CALCIUM) 1250 MG tablet, Take 1 tablet by mouth daily.  , Disp: , Rfl:  .  CRESTOR 5 MG tablet, Take 5 mg by mouth at bedtime. , Disp: , Rfl:  .  dexlansoprazole (DEXILANT) 60 MG capsule, Take 60 mg by mouth daily., Disp: , Rfl:  .  DULoxetine (CYMBALTA) 60 MG capsule, Take 1 capsule (60 mg total) by mouth daily. Brand medically necessary, Disp: 90 capsule,  Rfl: 2 .  levothyroxine (SYNTHROID, LEVOTHROID) 25 MCG tablet, Take 25 mcg by mouth daily.  , Disp: , Rfl:  .  MAGNESIUM PO, Take 1 tablet by mouth daily., Disp: , Rfl:  .  metoprolol succinate (TOPROL-XL) 25 MG 24 hr tablet, Take 25 mg by mouth daily.  , Disp: , Rfl:  .  Multiple Vitamin (MULTIVITAMIN) tablet, Take 1 tablet by mouth daily.  , Disp: , Rfl:  .  NORVASC 5 MG tablet, Take 1 tablet by mouth daily., Disp: , Rfl:  .  triamterene-hydrochlorothiazide (MAXZIDE-25) 37.5-25 MG per tablet, Take 1 tablet by mouth daily.  , Disp: , Rfl:  .  vitamin C (ASCORBIC ACID) 500 MG tablet, Take 500 mg by mouth daily.  , Disp: , Rfl:  .  Vitamin D, Ergocalciferol, (DRISDOL) 50000 UNITS CAPS, Take 50,000 Units by mouth every 7 (seven) days. Take on Friday , Disp: , Rfl:  .  zolpidem (AMBIEN CR) 12.5 MG CR tablet, Take 12.5 mg by mouth at bedtime., Disp: , Rfl:   Allergies: No Known Allergies  Past Medical History, Surgical history, Social history, and Family History were reviewed and updated.  Review of Systems: Review of Systems  Constitutional: Negative.  Negative for appetite change,  fatigue, fever and unexpected weight change.  HENT:  Negative.  Negative for lump/mass, mouth sores, sore throat and trouble swallowing.   Eyes: Negative.   Respiratory: Negative.  Negative for cough, hemoptysis and shortness of breath.   Cardiovascular: Negative.  Negative for leg swelling and palpitations.  Gastrointestinal: Negative.  Negative for abdominal distention, abdominal pain, blood in stool, constipation, diarrhea, nausea and vomiting.  Endocrine: Negative.   Genitourinary: Negative.  Negative for bladder incontinence, dysuria, frequency and hematuria.   Musculoskeletal: Positive for myalgias. Negative for arthralgias, back pain and gait problem.  Skin: Negative.  Negative for itching and rash.  Neurological: Positive for headaches. Negative for dizziness, extremity weakness, gait problem, numbness,  seizures and speech difficulty.  Hematological: Negative.  Does not bruise/bleed easily.  Psychiatric/Behavioral: Negative.  Negative for depression and sleep disturbance. The patient is not nervous/anxious.     Physical Exam:  height is '5\' 1"'$  (1.549 m) and weight is 137 lb 12.8 oz (62.5 kg). Her oral temperature is 98.2 F (36.8 C). Her blood pressure is 120/58 (abnormal) and her pulse is 70. Her respiration is 18 and oxygen saturation is 100%.   Wt Readings from Last 3 Encounters:  06/29/18 137 lb 12.8 oz (62.5 kg)  06/08/18 137 lb (62.1 kg)  02/23/18 139 lb 3.2 oz (63.1 kg)    Physical Exam   Lab Results  Component Value Date   WBC 4.6 06/29/2018   HGB 13.8 06/29/2018   HCT 42.8 06/29/2018   MCV 102.6 (H) 06/29/2018   PLT 320 06/29/2018     Chemistry      Component Value Date/Time   NA 138 06/29/2018 1259   K 4.4 06/29/2018 1259   CL 99 06/29/2018 1259   CO2 33 (H) 06/29/2018 1259   BUN 21 06/29/2018 1259   CREATININE 0.95 06/29/2018 1259      Component Value Date/Time   CALCIUM 10.3 06/29/2018 1259   ALKPHOS 101 06/29/2018 1259   AST 123 (H) 06/29/2018 1259   ALT 130 (H) 06/29/2018 1259   BILITOT 0.5 06/29/2018 1259         Impression and Plan: [name] is a 85 year old white female.  She has an IgA kappa MGUS.  This is low level.  This certainly could be from her age alone.  I do not see anything that suggest myeloma.  I am worried about her liver function studies.  Hopefully this is just from her taking Tylenol.  Given that, I told her not to take any Tylenol.  If she has a headache, she could certainly take aspirin or a nonsteroidal with food.  She has to come back to the office in 10 days so we can recheck her liver function studies.  I we will see what her monoclonal gammopathy studies look like.  I do not see any indication for a bone marrow biopsy on her.   I will see her back in 6 weeks.  If all looks okay in 6 weeks, then we will go and see  her every 3 months for the first year.  We had good fellowship.  It was a pleasure talking with her.    [name], MD 1/31/20202:37 PM

## 2018-06-30 DIAGNOSIS — J069 Acute upper respiratory infection, unspecified: Secondary | ICD-10-CM | POA: Diagnosis not present

## 2018-07-02 ENCOUNTER — Ambulatory Visit (INDEPENDENT_AMBULATORY_CARE_PROVIDER_SITE_OTHER): Payer: Medicare Other

## 2018-07-02 ENCOUNTER — Other Ambulatory Visit: Payer: Self-pay | Admitting: Internal Medicine

## 2018-07-02 DIAGNOSIS — R000 Tachycardia, unspecified: Secondary | ICD-10-CM

## 2018-07-03 ENCOUNTER — Telehealth: Payer: Self-pay

## 2018-07-03 LAB — MULTIPLE MYELOMA PANEL, SERUM
Albumin SerPl Elph-Mcnc: 3.7 g/dL (ref 2.9–4.4)
Albumin/Glob SerPl: 1.3 (ref 0.7–1.7)
Alpha 1: 0.2 g/dL (ref 0.0–0.4)
Alpha2 Glob SerPl Elph-Mcnc: 0.7 g/dL (ref 0.4–1.0)
B-Globulin SerPl Elph-Mcnc: 1.1 g/dL (ref 0.7–1.3)
Gamma Glob SerPl Elph-Mcnc: 0.8 g/dL (ref 0.4–1.8)
Globulin, Total: 2.9 g/dL (ref 2.2–3.9)
IgA: 394 mg/dL (ref 64–422)
IgG (Immunoglobin G), Serum: 717 mg/dL (ref 700–1600)
IgM (Immunoglobulin M), Srm: 78 mg/dL (ref 26–217)
M Protein SerPl Elph-Mcnc: 0.2 g/dL — ABNORMAL HIGH
Total Protein ELP: 6.6 g/dL (ref 6.0–8.5)

## 2018-07-03 NOTE — Telephone Encounter (Signed)
PA completed for brand name Cymbalta via cover my med.  KEY [id number] Pa # [id number]   Per cover my med "[employer] is reviewing your PA request and will respond within 24 hours for Medicaid or up to 72 hours for non-Medicaid plans, based on the required timeframe determined by state or federal regulations"  Will follow on cover my med.

## 2018-07-04 MED ORDER — DULOXETINE HCL 60 MG PO CPEP: 60.0000 mg | ORAL_CAPSULE | Freq: Every day | ORAL | 2 refills | Status: DC

## 2018-07-04 NOTE — Telephone Encounter (Signed)
I called the patient.  The patient is on brand name Cymbalta by her preference, she indicates that generics usually do not work as well for her.  She has a new insurance, they will not pay for the brand name Cymbalta unless there is a medical reason for it.  The patient is willing to try generic product, I sent in a prescription.

## 2018-07-04 NOTE — Addendum Note (Signed)
Addended by: Kathrynn Ducking on: 07/04/2018 08:03 AM   Modules accepted: Orders

## 2018-07-04 NOTE — Telephone Encounter (Signed)
Received determination on PA for Cymbalta. PA has been denied fax states " Coverage is provided in situations where the brand product is being requested due to a formulation difference in the inactive ingredients. Ex include; difference in dyes, fillers and preservatives that would result in significant allergy and or serious adverse reaction"  Will fwd determination to MD

## 2018-07-06 ENCOUNTER — Other Ambulatory Visit: Payer: Self-pay | Admitting: Neurology

## 2018-07-09 ENCOUNTER — Inpatient Hospital Stay: Payer: Medicare Other | Attending: Hematology & Oncology

## 2018-07-09 DIAGNOSIS — Z79899 Other long term (current) drug therapy: Secondary | ICD-10-CM | POA: Diagnosis not present

## 2018-07-09 DIAGNOSIS — G603 Idiopathic progressive neuropathy: Secondary | ICD-10-CM

## 2018-07-09 DIAGNOSIS — D472 Monoclonal gammopathy: Secondary | ICD-10-CM | POA: Diagnosis not present

## 2018-07-09 DIAGNOSIS — I10 Essential (primary) hypertension: Secondary | ICD-10-CM | POA: Diagnosis not present

## 2018-07-09 LAB — CMP (CANCER CENTER ONLY)
ALT: 24 U/L (ref 0–44)
AST: 23 U/L (ref 15–41)
Albumin: 4.2 g/dL (ref 3.5–5.0)
Alkaline Phosphatase: 80 U/L (ref 38–126)
Anion gap: 8 (ref 5–15)
BUN: 19 mg/dL (ref 8–23)
CO2: 30 mmol/L (ref 22–32)
Calcium: 10.2 mg/dL (ref 8.9–10.3)
Chloride: 97 mmol/L — ABNORMAL LOW (ref 98–111)
Creatinine: 1.04 mg/dL — ABNORMAL HIGH (ref 0.44–1.00)
GFR, Est AFR Am: 58 mL/min — ABNORMAL LOW (ref >60)
GFR, Estimated: 50 mL/min — ABNORMAL LOW (ref >60)
Glucose, Bld: 89 mg/dL (ref 70–99)
Potassium: 3.7 mmol/L (ref 3.5–5.1)
Sodium: 135 mmol/L (ref 135–145)
Total Bilirubin: 0.5 mg/dL (ref 0.3–1.2)
Total Protein: 6.6 g/dL (ref 6.5–8.1)

## 2018-08-09 ENCOUNTER — Ambulatory Visit: Payer: Medicare Other | Admitting: Interventional Cardiology

## 2018-08-10 ENCOUNTER — Other Ambulatory Visit: Payer: Medicare Other

## 2018-08-13 ENCOUNTER — Encounter: Payer: Self-pay | Admitting: Nurse Practitioner

## 2018-08-13 ENCOUNTER — Other Ambulatory Visit: Payer: Self-pay | Admitting: Family

## 2018-08-13 ENCOUNTER — Encounter: Payer: Self-pay | Admitting: Hematology & Oncology

## 2018-08-13 ENCOUNTER — Ambulatory Visit (INDEPENDENT_AMBULATORY_CARE_PROVIDER_SITE_OTHER): Payer: Medicare Other | Admitting: Nurse Practitioner

## 2018-08-13 ENCOUNTER — Inpatient Hospital Stay: Payer: Medicare Other

## 2018-08-13 ENCOUNTER — Inpatient Hospital Stay: Payer: Medicare Other | Attending: Hematology & Oncology | Admitting: Hematology & Oncology

## 2018-08-13 ENCOUNTER — Other Ambulatory Visit: Payer: Self-pay

## 2018-08-13 VITALS — BP 130/80 | HR 85 | Ht 61.0 in | Wt 140.8 lb

## 2018-08-13 VITALS — BP 144/75 | HR 89 | Temp 98.1°F | Resp 18 | Wt 138.0 lb

## 2018-08-13 DIAGNOSIS — D508 Other iron deficiency anemias: Secondary | ICD-10-CM

## 2018-08-13 DIAGNOSIS — D472 Monoclonal gammopathy: Secondary | ICD-10-CM

## 2018-08-13 DIAGNOSIS — G603 Idiopathic progressive neuropathy: Secondary | ICD-10-CM

## 2018-08-13 DIAGNOSIS — R599 Enlarged lymph nodes, unspecified: Secondary | ICD-10-CM | POA: Insufficient documentation

## 2018-08-13 DIAGNOSIS — M79602 Pain in left arm: Secondary | ICD-10-CM

## 2018-08-13 DIAGNOSIS — Z79899 Other long term (current) drug therapy: Secondary | ICD-10-CM | POA: Diagnosis not present

## 2018-08-13 DIAGNOSIS — R000 Tachycardia, unspecified: Secondary | ICD-10-CM

## 2018-08-13 DIAGNOSIS — G629 Polyneuropathy, unspecified: Secondary | ICD-10-CM | POA: Diagnosis not present

## 2018-08-13 DIAGNOSIS — M542 Cervicalgia: Secondary | ICD-10-CM

## 2018-08-13 LAB — IRON AND TIBC
Iron: 87 ug/dL (ref 41–142)
Saturation Ratios: 21 % (ref 21–57)
TIBC: 410 ug/dL (ref 236–444)
UIBC: 323 ug/dL (ref 120–384)

## 2018-08-13 LAB — CMP (CANCER CENTER ONLY)
ALT: 20 U/L (ref 0–44)
AST: 24 U/L (ref 15–41)
Albumin: 4.4 g/dL (ref 3.5–5.0)
Alkaline Phosphatase: 71 U/L (ref 38–126)
Anion gap: 7 (ref 5–15)
BUN: 20 mg/dL (ref 8–23)
CO2: 31 mmol/L (ref 22–32)
Calcium: 9.7 mg/dL (ref 8.9–10.3)
Chloride: 99 mmol/L (ref 98–111)
Creatinine: 0.92 mg/dL (ref 0.44–1.00)
GFR, Est AFR Am: >60 mL/min (ref >60)
GFR, Estimated: 58 mL/min — ABNORMAL LOW (ref >60)
Glucose, Bld: 102 mg/dL — ABNORMAL HIGH (ref 70–99)
Potassium: 3.6 mmol/L (ref 3.5–5.1)
Sodium: 137 mmol/L (ref 135–145)
Total Bilirubin: 0.5 mg/dL (ref 0.3–1.2)
Total Protein: 6.7 g/dL (ref 6.5–8.1)

## 2018-08-13 LAB — CBC WITH DIFFERENTIAL (CANCER CENTER ONLY)
Abs Immature Granulocytes: 0.01 10*3/uL (ref 0.00–0.07)
Basophils Absolute: 0 10*3/uL (ref 0.0–0.1)
Basophils Relative: 0 %
Eosinophils Absolute: 0.1 10*3/uL (ref 0.0–0.5)
Eosinophils Relative: 2 %
HCT: 41.1 % (ref 36.0–46.0)
Hemoglobin: 13.5 g/dL (ref 12.0–15.0)
Immature Granulocytes: 0 %
Lymphocytes Relative: 30 %
Lymphs Abs: 1.5 10*3/uL (ref 0.7–4.0)
MCH: 32.8 pg (ref 26.0–34.0)
MCHC: 32.8 g/dL (ref 30.0–36.0)
MCV: 100 fL (ref 80.0–100.0)
Monocytes Absolute: 0.7 10*3/uL (ref 0.1–1.0)
Monocytes Relative: 13 %
Neutro Abs: 2.8 10*3/uL (ref 1.7–7.7)
Neutrophils Relative %: 55 %
Platelet Count: 339 10*3/uL (ref 150–400)
RBC: 4.11 MIL/uL (ref 3.87–5.11)
RDW: 12.4 % (ref 11.5–15.5)
WBC Count: 5 10*3/uL (ref 4.0–10.5)
nRBC: 0 % (ref 0.0–0.2)

## 2018-08-13 LAB — VITAMIN B12: Vitamin B-12: 2378 pg/mL — ABNORMAL HIGH (ref 180–914)

## 2018-08-13 LAB — FERRITIN: Ferritin: 23 ng/mL (ref 11–307)

## 2018-08-13 LAB — SAVE SMEAR(SSMR), FOR PROVIDER SLIDE REVIEW

## 2018-08-13 LAB — LACTATE DEHYDROGENASE: LDH: 218 U/L — ABNORMAL HIGH (ref 98–192)

## 2018-08-13 NOTE — Progress Notes (Signed)
Hematology and Oncology Follow Up Visit  BELANNA MANRING [id number] [birth year] 85 y.o. 08/13/2018   Principle Diagnosis:   IgA Kappa MGUS  Current Therapy:    Observation     Interim History:  Ms. Blitzer is back for her first office visit.  She is doing pretty well.  Like everybody else, she is worried about this whole coronavirus episode.  Hopefully, I reassured her that this really should not be a problem for her.  She is healthy.  I just do not think that is going to be a problem for her.  When we first saw her her M spike was 1 0.2 g/dL.  Her Ig a level was 394 mg/dL.    She has problems with her neck.  This adenopathy is related to myeloma.  I think she probably is going to need a MRI of the neck.  She had surgery before by Dr. Ellene Route.  Once we get the MRI, will send her over to his office and see what he thinks about this.  She says there is pain that goes down the left upper arm.  She has no weakness in the arms.  When we last saw her, there is some elevation of her liver test.  I thought that this might have been from her taking a lot of Tylenol.  She is stop the Tylenol and her liver function studies have come back to normal.    There is no change in bowel or bladder habits.  She has had no cough.  She has had no rashes.  She still has neuropathy in her feet.  This is not from the MGUS.    Overall, I think her performance status is ECOG 1.  Medications:  Current Outpatient Medications:  .  ALPRAZolam (XANAX) 0.5 MG tablet, Take 0.5 mg by mouth 2 (two) times daily as needed (restless legs)., Disp: , Rfl:  .  aspirin 81 MG tablet, Take 81 mg by mouth daily., Disp: , Rfl:  .  baclofen (LIORESAL) 10 MG tablet, TAKE 1/2 TABLET (5 MG TOTAL) BY MOUTH AT BEDTIME., Disp: 45 tablet, Rfl: 2 .  calcium carbonate (OS-CAL - DOSED IN MG OF ELEMENTAL CALCIUM) 1250 MG tablet, Take 1 tablet by mouth daily.  , Disp: , Rfl:  .  CRESTOR 5 MG tablet, Take 5 mg by mouth at bedtime. , Disp: , Rfl:   .  dexlansoprazole (DEXILANT) 60 MG capsule, Take 60 mg by mouth daily., Disp: , Rfl:  .  levothyroxine (SYNTHROID, LEVOTHROID) 25 MCG tablet, Take 25 mcg by mouth daily.  , Disp: , Rfl:  .  MAGNESIUM PO, Take 1 tablet by mouth daily., Disp: , Rfl:  .  metoprolol succinate (TOPROL-XL) 25 MG 24 hr tablet, Take 25 mg by mouth daily.  , Disp: , Rfl:  .  Multiple Vitamin (MULTIVITAMIN) tablet, Take 1 tablet by mouth daily.  , Disp: , Rfl:  .  NORVASC 5 MG tablet, Take 1 tablet by mouth daily., Disp: , Rfl:  .  triamterene-hydrochlorothiazide (MAXZIDE-25) 37.5-25 MG per tablet, Take 1 tablet by mouth daily.  , Disp: , Rfl:  .  vitamin C (ASCORBIC ACID) 500 MG tablet, Take 500 mg by mouth daily.  , Disp: , Rfl:  .  Vitamin D, Ergocalciferol, (DRISDOL) 50000 UNITS CAPS, Take 50,000 Units by mouth every 7 (seven) days. Take on Friday , Disp: , Rfl:  .  zolpidem (AMBIEN CR) 12.5 MG CR tablet, Take 12.5 mg by mouth at bedtime.,  Disp: , Rfl:   Allergies: No Known Allergies  Past Medical History, Surgical history, Social history, and Family History were reviewed and updated.  Review of Systems: Review of Systems  Constitutional: Negative.  Negative for appetite change, fatigue, fever and unexpected weight change.  HENT:  Negative.  Negative for lump/mass, mouth sores, sore throat and trouble swallowing.   Eyes: Negative.   Respiratory: Negative.  Negative for cough, hemoptysis and shortness of breath.   Cardiovascular: Negative.  Negative for leg swelling and palpitations.  Gastrointestinal: Negative.  Negative for abdominal distention, abdominal pain, blood in stool, constipation, diarrhea, nausea and vomiting.  Endocrine: Negative.   Genitourinary: Negative.  Negative for bladder incontinence, dysuria, frequency and hematuria.   Musculoskeletal: Positive for myalgias. Negative for arthralgias, back pain and gait problem.  Skin: Negative.  Negative for itching and rash.  Neurological: Positive for  headaches. Negative for dizziness, extremity weakness, gait problem, numbness, seizures and speech difficulty.  Hematological: Negative.  Does not bruise/bleed easily.  Psychiatric/Behavioral: Negative.  Negative for depression and sleep disturbance. The patient is not nervous/anxious.     Physical Exam:  weight is 138 lb (62.6 kg). Her oral temperature is 98.1 F (36.7 C). Her blood pressure is 144/75 (abnormal) and her pulse is 89. Her respiration is 18 and oxygen saturation is 98%.   Wt Readings from Last 3 Encounters:  08/13/18 138 lb (62.6 kg)  06/29/18 137 lb 12.8 oz (62.5 kg)  06/08/18 137 lb (62.1 kg)    Physical Exam   Lab Results  Component Value Date   WBC 5.0 08/13/2018   HGB 13.5 08/13/2018   HCT 41.1 08/13/2018   MCV 100.0 08/13/2018   PLT 339 08/13/2018     Chemistry      Component Value Date/Time   NA 135 07/09/2018 1340   K 3.7 07/09/2018 1340   CL 97 (L) 07/09/2018 1340   CO2 30 07/09/2018 1340   BUN 19 07/09/2018 1340   CREATININE 1.04 (H) 07/09/2018 1340      Component Value Date/Time   CALCIUM 10.2 07/09/2018 1340   ALKPHOS 80 07/09/2018 1340   AST 23 07/09/2018 1340   ALT 24 07/09/2018 1340   BILITOT 0.5 07/09/2018 1340         Impression and Plan: Ms. Uresti is a 85 year old white female.  She has an IgA kappa MGUS.  This is low level.  This certainly could be from her age alone.  I do not see anything that suggest myeloma.  She still wants to have this MGUS followed closely.  We will plan to get her back in a couple of months.  We will try to get the MRI set up for another week or so.  [name], MD 3/16/20209:00 AM

## 2018-08-13 NOTE — Progress Notes (Signed)
CARDIOLOGY OFFICE NOTE  Date:  08/13/2018    [name] Date of Birth: November 02, 1933 Medical Record #[id number]  PCP:  Burnard Bunting, MD  Cardiologist:  Tamala Julian   Chief Complaint  Patient presents with  . Follow-up    Follow up after event monitor - seen for Dr. Tamala Julian    History of Present Illness: [name] is a 85 y.o. female who presents today for a follow up visit. Seen for Dr. Tamala Julian.   She has a history of nonobstructive coronary disease, hypertension, and hyperlipidemia. Other issues include a fractured left lower extremity, neuropathy which work up led to discovery of a monoclonal immunoglobulin spike.   Last seen in September - felt to be stable from our standpoint.   Patient screened for recent travel, fever, URI symptoms and shortness of breath. Patient denies travel over the last 14 days and are currently without symptoms.    Comes in today. Here alone. Referred last month for an event monitor - just recently completed for fast heart beating - she reports having a normal EKG and TSH. She has had the URI that went around earlier this winter - then had pneumonia - slow to recover. During this time has had more spells of waking up with heart racing - feels like she is "shaking inside". No chest pain. She has some continued DOE - she feels like it has been slow to get her strength back. She was anxious to hear about her monitor results - she is worried about AF - her husband had this. No longer on Cymbalta or Baclofen with her recent blood abnormality noted.   Past Medical History:  Diagnosis Date  . Arthritis   . Complication of anesthesia    Elevated blood pressure after having last Kyphoplasty; pt stated "I was given Morphine and had to stay overnight"  . DI (detrusor instability)   . Fracture of vertebra    x 3  . GERD (gastroesophageal reflux disease)   . History of hiatal hernia   . Hypertension   . Hypothyroidism   . Menopausal symptoms   . MGUS  (monoclonal gammopathy of unknown significance) 12/27/2017   IgA  . Osteoporosis   . Peripheral neuropathy 12/27/2017  . Restless leg syndrome   . Varicose veins     Past Surgical History:  Procedure Laterality Date  . ABDOMINAL HYSTERECTOMY  1992   TAH,BSO  . BLADDER SUSPENSION  2011   CRYOMESH  . CARPAL TUNNEL RELEASE Bilateral 1982  . CHOLECYSTECTOMY  1992  . COLONOSCOPY    . EYE SURGERY Bilateral    Cataract removal  . KYPHOPLASTY     X 3  . LUMBAR LAMINECTOMY WITH COFLEX 1 LEVEL N/A 12/29/2015   Procedure: L3-4 Laminectomy with Coflex;  Surgeon: Kristeen Miss, MD;  Location: [facility];  Service: Neurosurgery;  Laterality: N/A;  L3-4 Laminectomy with coflex  . OOPHORECTOMY     BSO  . REPLACEMENT TOTAL KNEE  2008,  2011   RIGHT 2008, LEFT 2011  . SPINAL FUSION  2011  . VERTEBRAL SURGERY     T-8, T-10. T-12  DR Roselee Culver     Medications: Current Meds  Medication Sig  . ALPRAZolam (XANAX) 0.5 MG tablet Take 0.5 mg by mouth 2 (two) times daily as needed (restless legs).  Marland Kitchen aspirin 81 MG tablet Take 81 mg by mouth daily.  . baclofen (LIORESAL) 10 MG tablet TAKE 1/2 TABLET (5 MG TOTAL) BY MOUTH AT BEDTIME.  Marland Kitchen  calcium carbonate (OS-CAL - DOSED IN MG OF ELEMENTAL CALCIUM) 1250 MG tablet Take 1 tablet by mouth daily.    . CRESTOR 5 MG tablet Take 5 mg by mouth at bedtime.   Marland Kitchen dexlansoprazole (DEXILANT) 60 MG capsule Take 60 mg by mouth daily.  Marland Kitchen levothyroxine (SYNTHROID, LEVOTHROID) 25 MCG tablet Take 25 mcg by mouth daily.    Marland Kitchen MAGNESIUM PO Take 1 tablet by mouth daily.  . metoprolol succinate (TOPROL-XL) 25 MG 24 hr tablet Take 25 mg by mouth daily.    . Multiple Vitamin (MULTIVITAMIN) tablet Take 1 tablet by mouth daily.    . NORVASC 5 MG tablet Take 1 tablet by mouth daily.  Marland Kitchen triamterene-hydrochlorothiazide (MAXZIDE-25) 37.5-25 MG per tablet Take 1 tablet by mouth daily.    . vitamin C (ASCORBIC ACID) 500 MG tablet Take 500 mg by mouth daily.    . Vitamin D,  Ergocalciferol, (DRISDOL) 50000 UNITS CAPS Take 50,000 Units by mouth every 7 (seven) days. Take on Friday   . zolpidem (AMBIEN CR) 12.5 MG CR tablet Take 12.5 mg by mouth at bedtime.     Allergies: No Known Allergies  Social History: The patient  reports that she has never smoked. She has never used smokeless tobacco. She reports that she does not drink alcohol or use drugs.   Family History: The patient's family history includes Cancer in her sister and sister; Diabetes in her sister and sister; Heart disease in her brother, brother, father, and mother; Hypertension in her brother, brother, and sister; Stroke in her father.   Review of Systems: Please see the history of present illness.   Otherwise, the review of systems is positive for none.   All other systems are reviewed and negative.   Physical Exam: VS:  BP 130/80 (BP Location: Left Arm, Patient Position: Sitting, Cuff Size: Normal)   Pulse 85   Ht 5\' 1"  (1.549 m)   Wt 140 lb 12.8 oz (63.9 kg)   SpO2 97% Comment: at rest  BMI 26.60 kg/m  .  BMI Body mass index is 26.6 kg/m.  Wt Readings from Last 3 Encounters:  08/13/18 140 lb 12.8 oz (63.9 kg)  08/13/18 138 lb (62.6 kg)  06/29/18 137 lb 12.8 oz (62.5 kg)   BP is 110/80 by me.   General: Pleasant. Elderly. Alert and in no acute distress.   HEENT: Normal.  Neck: Supple, no JVD, carotid bruits, or masses noted.  Cardiac: Regular rate and rhythm. No murmurs, rubs, or gallops. No edema.  Respiratory:  Lungs are clear to auscultation bilaterally with normal work of breathing.  GI: Soft and nontender.  MS: No deformity or atrophy. Gait and ROM intact.  Skin: Warm and dry. Color is normal.  Neuro:  Strength and sensation are intact and no gross focal deficits noted.  Psych: Alert, appropriate and with normal affect.   LABORATORY DATA:  EKG:  EKG is not ordered today.  Lab Results  Component Value Date   WBC 5.0 08/13/2018   HGB 13.5 08/13/2018   HCT 41.1  08/13/2018   PLT 339 08/13/2018   GLUCOSE 102 (H) 08/13/2018   ALT 20 08/13/2018   AST 24 08/13/2018   NA 137 08/13/2018   K 3.6 08/13/2018   CL 99 08/13/2018   CREATININE 0.92 08/13/2018   BUN 20 08/13/2018   CO2 31 08/13/2018   INR 2.90 (H) 12/31/2009       BNP (last 3 results) No results for input(s): BNP in  the last 8760 hours.  ProBNP (last 3 results) No results for input(s): PROBNP in the last 8760 hours.   Other Studies Reviewed Today:  Event monitor Study Highlights 06/2018  Sinus rhythm 31 patient symptomatic events are reviewed and are all sinus rhythm, or sinus tachycardia Rare premature atrial contractions and rare premature ventricular contractions are observed No sustained arrhythmias No atrial fibrillation     Assessment/Plan:  1. Elevated HR - mostly in the morning - she takes all her BP meds in the AM - will try changing Metoprolol to night - may need to increase the dose but will see how she does with just changing the timing first. Her monitor was stable - no AF but some NSR/ST with PACs and PVCs noted.   2. Known CAD - non obstructive - no active chest pain.   3. HTN - BP here is ok - she notes it is quite high at home - need to check her cuff.   4. HLD - not discussed   Current medicines are reviewed with the patient today.  The patient does not have concerns regarding medicines other than what has been noted above.  The following changes have been made:  See above.  Labs/ tests ordered today include:   No orders of the defined types were placed in this encounter.    Disposition:   FU with Dr. Tamala Julian as planned. Will make adjustments in her dose of Metoprolol over the phone if needed in light of the current pandemic situation.   Patient is agreeable to this plan and will call if any problems develop in the interim.   SignedTruitt Merle, NP  08/13/2018 2:08 PM  [facility] [address] [place]  [place], [place]  [address] Phone: [phone] Fax: [phone]

## 2018-08-13 NOTE — Patient Instructions (Addendum)
We will be checking the following labs today - NONE    Medication Instructions:    Continue with your current medicines. BUT I want you to start taking the Metoprolol at night and then every night starting tomorrow.   If you need a refill on your cardiac medications before your next appointment, please call your pharmacy.     Testing/Procedures To Be Arranged:  N/A  Follow-Up:   See Dr. Tamala Julian as planned.     At [facility], you and your health needs are our priority.  As part of our continuing mission to provide you with exceptional heart care, we have created designated Provider Care Teams.  These Care Teams include your primary Cardiologist (physician) and Advanced Practice Providers (APPs -  Physician Assistants and Nurse Practitioners) who all work together to provide you with the care you need, when you need it.  Special Instructions:  Marland Kitchen Monitor your BP and HR for [place] over the next week - call us on Monday with an update - we may need to increase the dose of Metoprolol  Call the [place] office at [phone] if you have any questions, problems or concerns.

## 2018-08-14 ENCOUNTER — Other Ambulatory Visit: Payer: Self-pay | Admitting: Hematology & Oncology

## 2018-08-14 DIAGNOSIS — M79676 Pain in unspecified toe(s): Secondary | ICD-10-CM | POA: Diagnosis not present

## 2018-08-14 DIAGNOSIS — L84 Corns and callosities: Secondary | ICD-10-CM | POA: Diagnosis not present

## 2018-08-14 DIAGNOSIS — G609 Hereditary and idiopathic neuropathy, unspecified: Secondary | ICD-10-CM | POA: Diagnosis not present

## 2018-08-14 DIAGNOSIS — B351 Tinea unguium: Secondary | ICD-10-CM | POA: Diagnosis not present

## 2018-08-14 DIAGNOSIS — D472 Monoclonal gammopathy: Secondary | ICD-10-CM

## 2018-08-14 LAB — KAPPA/LAMBDA LIGHT CHAINS
Kappa free light chain: 24.7 mg/L — ABNORMAL HIGH (ref 3.3–19.4)
Kappa, lambda light chain ratio: 1.27 (ref 0.26–1.65)
Lambda free light chains: 19.4 mg/L (ref 5.7–26.3)

## 2018-08-14 LAB — IGG, IGA, IGM
IgA: 403 mg/dL (ref 64–422)
IgG (Immunoglobin G), Serum: 772 mg/dL (ref 700–1600)
IgM (Immunoglobulin M), Srm: 77 mg/dL (ref 26–217)

## 2018-08-15 ENCOUNTER — Telehealth: Payer: Self-pay | Admitting: *Deleted

## 2018-08-15 NOTE — Telephone Encounter (Signed)
Message received from patient stating that she does not have her appts for May and also would like results regarding her protein level.  Call placed back to patient and message left on pt.'s answering machine to inform her that scheduling has been notified about her appts and that Dr. Marin Olp is out of the office until Monday.  Instructed pt to call back next week when Dr. Marin Olp is back in the office.

## 2018-08-17 ENCOUNTER — Ambulatory Visit: Payer: Medicare Other | Admitting: Hematology

## 2018-08-17 LAB — PROTEIN ELECTROPHORESIS, SERUM, WITH REFLEX

## 2018-08-18 ENCOUNTER — Ambulatory Visit (HOSPITAL_BASED_OUTPATIENT_CLINIC_OR_DEPARTMENT_OTHER): Payer: Medicare Other

## 2018-08-23 ENCOUNTER — Encounter: Payer: Self-pay | Admitting: Hematology & Oncology

## 2018-08-24 ENCOUNTER — Inpatient Hospital Stay: Payer: Medicare Other

## 2018-08-24 ENCOUNTER — Other Ambulatory Visit: Payer: Self-pay

## 2018-08-24 ENCOUNTER — Other Ambulatory Visit: Payer: Self-pay | Admitting: *Deleted

## 2018-08-24 DIAGNOSIS — D472 Monoclonal gammopathy: Secondary | ICD-10-CM

## 2018-08-24 DIAGNOSIS — G629 Polyneuropathy, unspecified: Secondary | ICD-10-CM | POA: Diagnosis not present

## 2018-08-24 DIAGNOSIS — Z79899 Other long term (current) drug therapy: Secondary | ICD-10-CM | POA: Diagnosis not present

## 2018-08-24 DIAGNOSIS — R599 Enlarged lymph nodes, unspecified: Secondary | ICD-10-CM | POA: Diagnosis not present

## 2018-08-27 ENCOUNTER — Encounter: Payer: Self-pay | Admitting: Hematology & Oncology

## 2018-08-27 LAB — PROTEIN ELECTROPHORESIS, SERUM
A/G Ratio: 1.4 (ref 0.7–1.7)
Albumin ELP: 3.9 g/dL (ref 2.9–4.4)
Alpha-1-Globulin: 0.2 g/dL (ref 0.0–0.4)
Alpha-2-Globulin: 0.7 g/dL (ref 0.4–1.0)
Beta Globulin: 1 g/dL (ref 0.7–1.3)
Gamma Globulin: 0.8 g/dL (ref 0.4–1.8)
Globulin, Total: 2.7 g/dL (ref 2.2–3.9)
M-Spike, %: 0.2 g/dL — ABNORMAL HIGH
Total Protein ELP: 6.6 g/dL (ref 6.0–8.5)

## 2018-08-28 ENCOUNTER — Telehealth: Payer: Self-pay | Admitting: *Deleted

## 2018-08-28 NOTE — Telephone Encounter (Signed)
-----   Message from [name], MD sent at 08/27/2018  5:05 PM EDT ----- Call - the myeloma protein is only 0.2!!!  This is holding stable!!!  [place] news!!! [name]

## 2018-08-28 NOTE — Telephone Encounter (Signed)
Notified pt of lab results. Pt thanked me for the call no further concerns.

## 2018-08-30 ENCOUNTER — Encounter: Payer: Self-pay | Admitting: Hematology & Oncology

## 2018-09-05 ENCOUNTER — Telehealth: Payer: Self-pay | Admitting: Nurse Practitioner

## 2018-09-05 ENCOUNTER — Other Ambulatory Visit: Payer: Self-pay | Admitting: *Deleted

## 2018-09-05 MED ORDER — METOPROLOL SUCCINATE ER 25 MG PO TB24: ORAL_TABLET | ORAL | 3 refills | Status: DC

## 2018-09-05 NOTE — Telephone Encounter (Signed)
Noted - hopefully Abram Sander can help sort out the monitor. [name] may need to touch base with Dr. Reynaldo Minium - maybe he could write a letter/appeal?  Ok to stay on her Toprol as she is currently taking. BP looks great and heart rate is less.   Will see back as planned.   Do we know what number she was trying to call??

## 2018-09-05 NOTE — Telephone Encounter (Signed)
Patient would like for nurse to call her. 

## 2018-09-05 NOTE — Telephone Encounter (Signed)
lvm will call pt back with update on monitor and medication.  Medication list updated.

## 2018-09-05 NOTE — Telephone Encounter (Signed)
Pt calling in today with a couple of questions.  Pt stated monitor that pt wore was not precerted and [employer] stated pt owed 1295 since the monitor was [place] out of [place].  Stated just lost husband does not have that kind of money.  This looks like it was ordered by Dr. Joya Salm but did send a note to Abram Sander to check on this for pt.  Also pt stated been trying to call for a week per Cecille Rubin and could not get anybody.  Pt did get a new OMRON cuff and after taking Toprol in the PM per Cecille Rubin was not working pt was still getting reading 151/92  HR 106, pt said since no one would answer phone she started playing with medications and no/w is taking an 1/2 toprol at night in addition to the tablet in the am.  Feels fine except for being a little tired but stated did just get over pneumonia.  Pt states does not feel like heart is running away.  Pt took bp while on phone pt's bp was  109/77  HR 89. This am, 138/83  HR 93.  Will send to Cecille Rubin to advise than will call pt back.

## 2018-09-05 NOTE — Telephone Encounter (Signed)
S/w pt is aware that [facility], monitor tech will following up on monitor bill.  That it is ok to stay on Toprol (25 mg) am one half tablet (12.5 mg ) pm, medication list updated.  Pt was trying to call the number at Zachary Lovins's desk.  Advised pt are not in office and to call the [phone] number.

## 2018-09-07 NOTE — Telephone Encounter (Signed)
Per [employer] monitor rep.Marland KitchenMarland KitchenMarland KitchenHe was able to speak to someone in there billing dept and as of right now she has not received anything from [employer]. I assume she was looking at an EOB.  They processed her Medicare which left $189 and that amount is now being processed by [employer]. They should cover most, if not all of it, but she simply needs to wait for it to process (which can take up to 45 days) and she will receive a final bill from [employer] if there is anything not covered by her insurance.

## 2018-09-13 ENCOUNTER — Encounter: Payer: Self-pay | Admitting: Hematology & Oncology

## 2018-10-02 ENCOUNTER — Other Ambulatory Visit: Payer: Self-pay

## 2018-10-02 ENCOUNTER — Inpatient Hospital Stay: Payer: Medicare Other

## 2018-10-02 ENCOUNTER — Encounter: Payer: Self-pay | Admitting: Hematology & Oncology

## 2018-10-02 ENCOUNTER — Inpatient Hospital Stay: Payer: Medicare Other | Attending: Hematology & Oncology | Admitting: Hematology & Oncology

## 2018-10-02 VITALS — BP 136/83 | HR 88 | Temp 98.4°F | Resp 18 | Ht 61.0 in | Wt 137.4 lb

## 2018-10-02 DIAGNOSIS — I10 Essential (primary) hypertension: Secondary | ICD-10-CM | POA: Diagnosis not present

## 2018-10-02 DIAGNOSIS — Z79899 Other long term (current) drug therapy: Secondary | ICD-10-CM

## 2018-10-02 DIAGNOSIS — D472 Monoclonal gammopathy: Secondary | ICD-10-CM

## 2018-10-02 DIAGNOSIS — C9000 Multiple myeloma not having achieved remission: Secondary | ICD-10-CM

## 2018-10-02 DIAGNOSIS — Z7982 Long term (current) use of aspirin: Secondary | ICD-10-CM

## 2018-10-02 LAB — CBC WITH DIFFERENTIAL (CANCER CENTER ONLY)
Abs Immature Granulocytes: 0.01 10*3/uL (ref 0.00–0.07)
Basophils Absolute: 0 10*3/uL (ref 0.0–0.1)
Basophils Relative: 0 %
Eosinophils Absolute: 0.1 10*3/uL (ref 0.0–0.5)
Eosinophils Relative: 3 %
HCT: 42 % (ref 36.0–46.0)
Hemoglobin: 13.7 g/dL (ref 12.0–15.0)
Immature Granulocytes: 0 %
Lymphocytes Relative: 34 %
Lymphs Abs: 1.5 10*3/uL (ref 0.7–4.0)
MCH: 31.7 pg (ref 26.0–34.0)
MCHC: 32.6 g/dL (ref 30.0–36.0)
MCV: 97.2 fL (ref 80.0–100.0)
Monocytes Absolute: 0.6 10*3/uL (ref 0.1–1.0)
Monocytes Relative: 13 %
Neutro Abs: 2.2 10*3/uL (ref 1.7–7.7)
Neutrophils Relative %: 50 %
Platelet Count: 330 10*3/uL (ref 150–400)
RBC: 4.32 MIL/uL (ref 3.87–5.11)
RDW: 12.5 % (ref 11.5–15.5)
WBC Count: 4.5 10*3/uL (ref 4.0–10.5)
nRBC: 0 % (ref 0.0–0.2)

## 2018-10-02 LAB — CMP (CANCER CENTER ONLY)
ALT: 22 U/L (ref 0–44)
AST: 30 U/L (ref 15–41)
Albumin: 4.3 g/dL (ref 3.5–5.0)
Alkaline Phosphatase: 64 U/L (ref 38–126)
Anion gap: 8 (ref 5–15)
BUN: 25 mg/dL — ABNORMAL HIGH (ref 8–23)
CO2: 31 mmol/L (ref 22–32)
Calcium: 10.3 mg/dL (ref 8.9–10.3)
Chloride: 100 mmol/L (ref 98–111)
Creatinine: 1.05 mg/dL — ABNORMAL HIGH (ref 0.44–1.00)
GFR, Est AFR Am: 57 mL/min — ABNORMAL LOW (ref >60)
GFR, Estimated: 49 mL/min — ABNORMAL LOW (ref >60)
Glucose, Bld: 88 mg/dL (ref 70–99)
Potassium: 3.7 mmol/L (ref 3.5–5.1)
Sodium: 139 mmol/L (ref 135–145)
Total Bilirubin: 0.6 mg/dL (ref 0.3–1.2)
Total Protein: 7 g/dL (ref 6.5–8.1)

## 2018-10-02 LAB — LACTATE DEHYDROGENASE: LDH: 227 U/L — ABNORMAL HIGH (ref 98–192)

## 2018-10-02 NOTE — Progress Notes (Signed)
Hematology and Oncology Follow Up Visit  RAFAEL QUESADA [id number] 01-05-1934 85 y.o. 10/02/2018   Principle Diagnosis:   IgA Kappa MGUS  Current Therapy:    Observation     Interim History:  Ms. Hockenbury is back for her first office visit.  She is managing pretty well.  She says that she still feels a little bit tired.  She has some achiness in her shoulders.  We cannot get an MRI done because of the coronavirus.  She still is dealing with the IgA kappa MGUS.  We last checked her myeloma studies in March, her M spike was 0.2 g/dL.  Her IgA level was 400 mg/dL.  Her kappa light chain was 1.9 mg/dL.  I think maybe a PET scan would not be a bad idea for Ms. Striplin.  There is concern to tell us if she has myelomatous lesions in her bones that would necessitate treatment.  I know that given recent studies with smoldering myeloma, does seem to be a benefit to treatment with Revlimid.  I may think about this again depending on what we find with the PET scan.  She has had no fever.  She thought that maybe she had the coronavirus back in February.  She is waiting for the antibody test to come out so that she can be checked.  She has had no rashes.  There is been no leg swelling.    Overall, I think her performance status is ECOG 1.  Medications:  Current Outpatient Medications:  .  ALPRAZolam (XANAX) 0.5 MG tablet, Take 0.5 mg by mouth 2 (two) times daily as needed (restless legs)., Disp: , Rfl:  .  aspirin 81 MG tablet, Take 81 mg by mouth daily., Disp: , Rfl:  .  calcium carbonate (OS-CAL - DOSED IN MG OF ELEMENTAL CALCIUM) 1250 MG tablet, Take 1 tablet by mouth daily.  , Disp: , Rfl:  .  CRESTOR 5 MG tablet, Take 5 mg by mouth at bedtime. , Disp: , Rfl:  .  dexlansoprazole (DEXILANT) 60 MG capsule, Take 60 mg by mouth daily., Disp: , Rfl:  .  levothyroxine (SYNTHROID, LEVOTHROID) 25 MCG tablet, Take 25 mcg by mouth daily.  , Disp: , Rfl:  .  MAGNESIUM PO, Take 1 tablet by mouth daily.,  Disp: , Rfl:  .  metoprolol succinate (TOPROL-XL) 25 MG 24 hr tablet, One tablet by mouth (25 mg ) in the am. One half tablet by mouth (12.5 mg ) pm, Disp: 90 tablet, Rfl: 3 .  Multiple Vitamin (MULTIVITAMIN) tablet, Take 1 tablet by mouth daily.  , Disp: , Rfl:  .  NORVASC 5 MG tablet, Take 1 tablet by mouth daily., Disp: , Rfl:  .  triamterene-hydrochlorothiazide (MAXZIDE-25) 37.5-25 MG per tablet, Take 1 tablet by mouth daily.  , Disp: , Rfl:  .  vitamin C (ASCORBIC ACID) 500 MG tablet, Take 500 mg by mouth daily.  , Disp: , Rfl:  .  Vitamin D, Ergocalciferol, (DRISDOL) 50000 UNITS CAPS, Take 50,000 Units by mouth every 7 (seven) days. Take on Friday , Disp: , Rfl:  .  zolpidem (AMBIEN CR) 12.5 MG CR tablet, Take 12.5 mg by mouth at bedtime., Disp: , Rfl:   Allergies: No Known Allergies  Past Medical History, Surgical history, Social history, and Family History were reviewed and updated.  Review of Systems: Review of Systems  Constitutional: Negative.  Negative for appetite change, fatigue, fever and unexpected weight change.  HENT:  Negative.  Negative  for lump/mass, mouth sores, sore throat and trouble swallowing.   Eyes: Negative.   Respiratory: Negative.  Negative for cough, hemoptysis and shortness of breath.   Cardiovascular: Negative.  Negative for leg swelling and palpitations.  Gastrointestinal: Negative.  Negative for abdominal distention, abdominal pain, blood in stool, constipation, diarrhea, nausea and vomiting.  Endocrine: Negative.   Genitourinary: Negative.  Negative for bladder incontinence, dysuria, frequency and hematuria.   Musculoskeletal: Positive for myalgias. Negative for arthralgias, back pain and gait problem.  Skin: Negative.  Negative for itching and rash.  Neurological: Positive for headaches. Negative for dizziness, extremity weakness, gait problem, numbness, seizures and speech difficulty.  Hematological: Negative.  Does not bruise/bleed easily.   Psychiatric/Behavioral: Negative.  Negative for depression and sleep disturbance. The patient is not nervous/anxious.     Physical Exam:  height is 5\' 1"  (1.549 m) and weight is 137 lb 6.4 oz (62.3 kg). Her oral temperature is 98.4 F (36.9 C). Her blood pressure is 136/83 and her pulse is 88. Her respiration is 18 and oxygen saturation is 98%.   Wt Readings from Last 3 Encounters:  10/02/18 137 lb 6.4 oz (62.3 kg)  08/13/18 140 lb 12.8 oz (63.9 kg)  08/13/18 138 lb (62.6 kg)    Physical Exam   Lab Results  Component Value Date   WBC 4.5 10/02/2018   HGB 13.7 10/02/2018   HCT 42.0 10/02/2018   MCV 97.2 10/02/2018   PLT 330 10/02/2018     Chemistry      Component Value Date/Time   NA 139 10/02/2018 0851   K 3.7 10/02/2018 0851   CL 100 10/02/2018 0851   CO2 31 10/02/2018 0851   BUN 25 (H) 10/02/2018 0851   CREATININE 1.05 (H) 10/02/2018 0851      Component Value Date/Time   CALCIUM 10.3 10/02/2018 0851   ALKPHOS 64 10/02/2018 0851   AST 30 10/02/2018 0851   ALT 22 10/02/2018 0851   BILITOT 0.6 10/02/2018 0851         Impression and Plan: Ms. Manka is a 85 year old white female.  She has an IgA kappa MGUS.  This is low level.  This certainly could be from her age alone.  Hopefully, we can get the PET scan on her.  I do think this will be very helpful.  I will plan to get her back in 1 month.  She has been very cautious with going outside.  She really does not go outside except to see doctors.   [name], MD 5/5/20209:54 AM

## 2018-10-03 LAB — KAPPA/LAMBDA LIGHT CHAINS
Kappa free light chain: 30.2 mg/L — ABNORMAL HIGH (ref 3.3–19.4)
Kappa, lambda light chain ratio: 1.42 (ref 0.26–1.65)
Lambda free light chains: 21.3 mg/L (ref 5.7–26.3)

## 2018-10-03 LAB — IGG, IGA, IGM
IgA: 387 mg/dL (ref 64–422)
IgG (Immunoglobin G), Serum: 776 mg/dL (ref 586–1602)
IgM (Immunoglobulin M), Srm: 70 mg/dL (ref 26–217)

## 2018-10-08 ENCOUNTER — Encounter: Payer: Self-pay | Admitting: Hematology & Oncology

## 2018-10-10 DIAGNOSIS — E785 Hyperlipidemia, unspecified: Secondary | ICD-10-CM | POA: Diagnosis not present

## 2018-10-10 DIAGNOSIS — R000 Tachycardia, unspecified: Secondary | ICD-10-CM | POA: Diagnosis not present

## 2018-10-10 DIAGNOSIS — E039 Hypothyroidism, unspecified: Secondary | ICD-10-CM | POA: Diagnosis not present

## 2018-10-10 DIAGNOSIS — K219 Gastro-esophageal reflux disease without esophagitis: Secondary | ICD-10-CM | POA: Diagnosis not present

## 2018-10-10 DIAGNOSIS — I10 Essential (primary) hypertension: Secondary | ICD-10-CM | POA: Diagnosis not present

## 2018-10-10 DIAGNOSIS — N183 Chronic kidney disease, stage 3 (moderate): Secondary | ICD-10-CM | POA: Diagnosis not present

## 2018-10-10 DIAGNOSIS — Z1331 Encounter for screening for depression: Secondary | ICD-10-CM | POA: Diagnosis not present

## 2018-10-10 DIAGNOSIS — I129 Hypertensive chronic kidney disease with stage 1 through stage 4 chronic kidney disease, or unspecified chronic kidney disease: Secondary | ICD-10-CM | POA: Diagnosis not present

## 2018-10-10 DIAGNOSIS — M5380 Other specified dorsopathies, site unspecified: Secondary | ICD-10-CM | POA: Diagnosis not present

## 2018-10-10 DIAGNOSIS — M1990 Unspecified osteoarthritis, unspecified site: Secondary | ICD-10-CM | POA: Diagnosis not present

## 2018-10-10 DIAGNOSIS — I2510 Atherosclerotic heart disease of native coronary artery without angina pectoris: Secondary | ICD-10-CM | POA: Diagnosis not present

## 2018-10-10 DIAGNOSIS — M810 Age-related osteoporosis without current pathological fracture: Secondary | ICD-10-CM | POA: Diagnosis not present

## 2018-10-12 DIAGNOSIS — E7849 Other hyperlipidemia: Secondary | ICD-10-CM | POA: Diagnosis not present

## 2018-10-12 DIAGNOSIS — E038 Other specified hypothyroidism: Secondary | ICD-10-CM | POA: Diagnosis not present

## 2018-10-12 DIAGNOSIS — N183 Chronic kidney disease, stage 3 (moderate): Secondary | ICD-10-CM | POA: Diagnosis not present

## 2018-10-12 DIAGNOSIS — Z20818 Contact with and (suspected) exposure to other bacterial communicable diseases: Secondary | ICD-10-CM | POA: Diagnosis not present

## 2018-10-12 LAB — PE (RFX IFE+FLC), S
A/G Ratio: 1.5 (ref 0.7–1.7)
Albumin ELP: 4 g/dL (ref 2.9–4.4)
Alpha-1-Globulin: 0.2 g/dL (ref 0.0–0.4)
Alpha-2-Globulin: 0.7 g/dL (ref 0.4–1.0)
Beta Globulin: 1 g/dL (ref 0.7–1.3)
Gamma Globulin: 0.7 g/dL (ref 0.4–1.8)
Globulin, Total: 2.7 g/dL (ref 2.2–3.9)
IFE Reflex: 0
M-Spike, %: 0.2 g/dL — ABNORMAL HIGH
Total Protein ELP: 6.7 g/dL (ref 6.0–8.5)

## 2018-10-15 ENCOUNTER — Telehealth: Payer: Self-pay | Admitting: *Deleted

## 2018-10-15 NOTE — Telephone Encounter (Signed)
Notified pt of results, pt verbalized understanding.  

## 2018-10-15 NOTE — Telephone Encounter (Signed)
-----   Message from [name], MD sent at 10/12/2018  4:10 PM EDT ----- Call - the protein level is NOT any higher!! It is still 0.2!! This is great!!  [name]

## 2018-10-16 ENCOUNTER — Other Ambulatory Visit: Payer: Self-pay

## 2018-10-16 ENCOUNTER — Ambulatory Visit (HOSPITAL_COMMUNITY)
Admission: RE | Admit: 2018-10-16 | Discharge: 2018-10-16 | Disposition: A | Discharge status: Home / Self Care | Payer: Medicare Other | Source: Ambulatory Visit | Attending: Hematology & Oncology | Admitting: Hematology & Oncology

## 2018-10-16 DIAGNOSIS — Z79899 Other long term (current) drug therapy: Secondary | ICD-10-CM | POA: Insufficient documentation

## 2018-10-16 DIAGNOSIS — C9000 Multiple myeloma not having achieved remission: Secondary | ICD-10-CM | POA: Insufficient documentation

## 2018-10-16 LAB — GLUCOSE, CAPILLARY: Glucose-Capillary: 87 mg/dL (ref 70–99)

## 2018-10-16 MED ORDER — FLUDEOXYGLUCOSE F - 18 (FDG) INJECTION
7.0700 | Freq: Once | INTRAVENOUS | Status: AC
  Administered 2018-10-16: 7.07 via INTRAVENOUS

## 2018-10-17 ENCOUNTER — Telehealth: Payer: Self-pay | Admitting: *Deleted

## 2018-10-17 NOTE — Telephone Encounter (Signed)
Unable to reach pt, lmovm with PET scan results. Requested pt call back to confirm message rcvd.

## 2018-10-17 NOTE — Telephone Encounter (Signed)
-----   Message from [name], MD sent at 10/17/2018  2:29 PM EDT ----- Call - the PETscan is negative for any myeloma bone lesions!! [name]

## 2018-10-17 NOTE — Telephone Encounter (Signed)
Pt called office confirmed message and results received. Pt thanked me for the information. No further concerns.

## 2018-10-18 DIAGNOSIS — L570 Actinic keratosis: Secondary | ICD-10-CM | POA: Diagnosis not present

## 2018-10-18 DIAGNOSIS — Z85828 Personal history of other malignant neoplasm of skin: Secondary | ICD-10-CM | POA: Diagnosis not present

## 2018-10-18 DIAGNOSIS — L821 Other seborrheic keratosis: Secondary | ICD-10-CM | POA: Diagnosis not present

## 2018-10-23 DIAGNOSIS — M79676 Pain in unspecified toe(s): Secondary | ICD-10-CM | POA: Diagnosis not present

## 2018-10-23 DIAGNOSIS — G609 Hereditary and idiopathic neuropathy, unspecified: Secondary | ICD-10-CM | POA: Diagnosis not present

## 2018-10-23 DIAGNOSIS — B351 Tinea unguium: Secondary | ICD-10-CM | POA: Diagnosis not present

## 2018-10-23 DIAGNOSIS — L84 Corns and callosities: Secondary | ICD-10-CM | POA: Diagnosis not present

## 2018-10-26 DIAGNOSIS — H1132 Conjunctival hemorrhage, left eye: Secondary | ICD-10-CM | POA: Diagnosis not present

## 2018-10-30 DIAGNOSIS — R238 Other skin changes: Secondary | ICD-10-CM | POA: Diagnosis not present

## 2018-10-30 DIAGNOSIS — H9311 Tinnitus, right ear: Secondary | ICD-10-CM | POA: Diagnosis not present

## 2018-11-06 DIAGNOSIS — H26491 Other secondary cataract, right eye: Secondary | ICD-10-CM | POA: Diagnosis not present

## 2018-11-08 DIAGNOSIS — H9313 Tinnitus, bilateral: Secondary | ICD-10-CM | POA: Diagnosis not present

## 2018-11-08 DIAGNOSIS — H903 Sensorineural hearing loss, bilateral: Secondary | ICD-10-CM | POA: Diagnosis not present

## 2018-11-13 ENCOUNTER — Ambulatory Visit: Payer: Medicare Other | Admitting: Hematology & Oncology

## 2018-11-13 ENCOUNTER — Other Ambulatory Visit: Payer: Medicare Other

## 2018-11-13 ENCOUNTER — Other Ambulatory Visit: Payer: Self-pay | Admitting: Otolaryngology

## 2018-11-13 DIAGNOSIS — H903 Sensorineural hearing loss, bilateral: Secondary | ICD-10-CM

## 2018-11-19 ENCOUNTER — Encounter: Payer: Self-pay | Admitting: Radiology

## 2018-11-21 ENCOUNTER — Ambulatory Visit
Admission: RE | Admit: 2018-11-21 | Discharge: 2018-11-21 | Disposition: A | Discharge status: Home / Self Care | Payer: Medicare Other | Source: Ambulatory Visit | Attending: Otolaryngology | Admitting: Otolaryngology

## 2018-11-21 DIAGNOSIS — H903 Sensorineural hearing loss, bilateral: Secondary | ICD-10-CM

## 2018-11-21 DIAGNOSIS — S0230XA Fracture of orbital floor, unspecified side, initial encounter for closed fracture: Secondary | ICD-10-CM | POA: Diagnosis not present

## 2018-11-29 ENCOUNTER — Inpatient Hospital Stay: Payer: Medicare Other | Attending: Hematology & Oncology

## 2018-11-29 ENCOUNTER — Inpatient Hospital Stay (HOSPITAL_BASED_OUTPATIENT_CLINIC_OR_DEPARTMENT_OTHER): Payer: Medicare Other | Admitting: Hematology & Oncology

## 2018-11-29 ENCOUNTER — Encounter: Payer: Self-pay | Admitting: Hematology & Oncology

## 2018-11-29 ENCOUNTER — Other Ambulatory Visit: Payer: Self-pay

## 2018-11-29 VITALS — BP 147/87 | HR 76 | Temp 97.1°F | Resp 19 | Wt 138.0 lb

## 2018-11-29 DIAGNOSIS — R5383 Other fatigue: Secondary | ICD-10-CM | POA: Diagnosis not present

## 2018-11-29 DIAGNOSIS — Z7982 Long term (current) use of aspirin: Secondary | ICD-10-CM | POA: Diagnosis not present

## 2018-11-29 DIAGNOSIS — D509 Iron deficiency anemia, unspecified: Secondary | ICD-10-CM

## 2018-11-29 DIAGNOSIS — E032 Hypothyroidism due to medicaments and other exogenous substances: Secondary | ICD-10-CM

## 2018-11-29 DIAGNOSIS — D472 Monoclonal gammopathy: Secondary | ICD-10-CM | POA: Diagnosis not present

## 2018-11-29 DIAGNOSIS — Z79899 Other long term (current) drug therapy: Secondary | ICD-10-CM | POA: Diagnosis not present

## 2018-11-29 DIAGNOSIS — G603 Idiopathic progressive neuropathy: Secondary | ICD-10-CM

## 2018-11-29 DIAGNOSIS — I10 Essential (primary) hypertension: Secondary | ICD-10-CM

## 2018-11-29 LAB — CBC WITH DIFFERENTIAL (CANCER CENTER ONLY)
Abs Immature Granulocytes: 0.01 10*3/uL (ref 0.00–0.07)
Basophils Absolute: 0 10*3/uL (ref 0.0–0.1)
Basophils Relative: 0 %
Eosinophils Absolute: 0.2 10*3/uL (ref 0.0–0.5)
Eosinophils Relative: 4 %
HCT: 40.6 % (ref 36.0–46.0)
Hemoglobin: 13.4 g/dL (ref 12.0–15.0)
Immature Granulocytes: 0 %
Lymphocytes Relative: 35 %
Lymphs Abs: 1.8 10*3/uL (ref 0.7–4.0)
MCH: 31.8 pg (ref 26.0–34.0)
MCHC: 33 g/dL (ref 30.0–36.0)
MCV: 96.4 fL (ref 80.0–100.0)
Monocytes Absolute: 0.7 10*3/uL (ref 0.1–1.0)
Monocytes Relative: 13 %
Neutro Abs: 2.5 10*3/uL (ref 1.7–7.7)
Neutrophils Relative %: 48 %
Platelet Count: 313 10*3/uL (ref 150–400)
RBC: 4.21 MIL/uL (ref 3.87–5.11)
RDW: 12.8 % (ref 11.5–15.5)
WBC Count: 5.2 10*3/uL (ref 4.0–10.5)
nRBC: 0 % (ref 0.0–0.2)

## 2018-11-29 LAB — CMP (CANCER CENTER ONLY)
ALT: 18 U/L (ref 0–44)
AST: 24 U/L (ref 15–41)
Albumin: 4.3 g/dL (ref 3.5–5.0)
Alkaline Phosphatase: 82 U/L (ref 38–126)
Anion gap: 8 (ref 5–15)
BUN: 25 mg/dL — ABNORMAL HIGH (ref 8–23)
CO2: 31 mmol/L (ref 22–32)
Calcium: 10.1 mg/dL (ref 8.9–10.3)
Chloride: 95 mmol/L — ABNORMAL LOW (ref 98–111)
Creatinine: 1 mg/dL (ref 0.44–1.00)
GFR, Est AFR Am: >60 mL/min (ref >60)
GFR, Estimated: 52 mL/min — ABNORMAL LOW (ref >60)
Glucose, Bld: 86 mg/dL (ref 70–99)
Potassium: 3.8 mmol/L (ref 3.5–5.1)
Sodium: 134 mmol/L — ABNORMAL LOW (ref 135–145)
Total Bilirubin: 0.6 mg/dL (ref 0.3–1.2)
Total Protein: 6.9 g/dL (ref 6.5–8.1)

## 2018-11-29 LAB — IRON AND TIBC
Iron: 103 ug/dL (ref 41–142)
Saturation Ratios: 26 % (ref 21–57)
TIBC: 399 ug/dL (ref 236–444)
UIBC: 296 ug/dL (ref 120–384)

## 2018-11-29 LAB — FERRITIN: Ferritin: 29 ng/mL (ref 11–307)

## 2018-11-29 LAB — TSH: TSH: 1.746 u[IU]/mL (ref 0.308–3.960)

## 2018-11-29 NOTE — Progress Notes (Signed)
Hematology and Oncology Follow Up Visit  [name] [id number] June 30, 1933 85 y.o. 11/29/2018   Principle Diagnosis:   IgA Kappa MGUS  Current Therapy:    Observation     Interim History:  [name] is back for follow-up.  Unfortunately, looks like she fell a week or so ago.  She sustained a fracture of the inferior orbit of the left eye.  Thankfully, there was no muscle or nerve entrapment.  We did do a PET scan on her.  This was done on Oct 16, 2018.  PET scan showed no evidence of myelomatous involvement of her bones.  Her last M spike that was done in May was 0.2 g/dL.  Her IgA level was 387 mg/dL.  Her kappa light chain was 3 mg/dL.  She is still tired.  We will check a TSH on her.  She has been on low-dose Synthroid for quite a while.  She being followed by Dr. Reynaldo Minium who does an incredible job with following his patients with her health issues.  She has had no problems with bowels or bladder.  She has had no rashes.  There is been no leg swelling.  She has had no change in bowel or bladder habits.  Overall, I think her performance status is ECOG 1.  Medications:  Current Outpatient Medications:  .  ALPRAZolam (XANAX) 0.5 MG tablet, Take 0.5 mg by mouth 2 (two) times daily as needed (restless legs)., Disp: , Rfl:  .  aspirin 81 MG tablet, Take 81 mg by mouth daily., Disp: , Rfl:  .  calcium carbonate (OS-CAL - DOSED IN MG OF ELEMENTAL CALCIUM) 1250 MG tablet, Take 1 tablet by mouth daily.  , Disp: , Rfl:  .  CRESTOR 5 MG tablet, Take 5 mg by mouth at bedtime. , Disp: , Rfl:  .  dexlansoprazole (DEXILANT) 60 MG capsule, Take 60 mg by mouth daily., Disp: , Rfl:  .  escitalopram (LEXAPRO) 10 MG tablet, Take 10 mg by mouth daily., Disp: , Rfl:  .  levothyroxine (SYNTHROID, LEVOTHROID) 25 MCG tablet, Take 25 mcg by mouth daily.  , Disp: , Rfl:  .  MAGNESIUM PO, Take 1 tablet by mouth daily., Disp: , Rfl:  .  metoprolol succinate (TOPROL-XL) 25 MG 24 hr tablet, One tablet by  mouth (25 mg ) in the am. One half tablet by mouth (12.5 mg ) pm, Disp: 90 tablet, Rfl: 3 .  Multiple Vitamin (MULTIVITAMIN) tablet, Take 1 tablet by mouth daily.  , Disp: , Rfl:  .  NORVASC 5 MG tablet, Take 1 tablet by mouth daily., Disp: , Rfl:  .  triamterene-hydrochlorothiazide (MAXZIDE-25) 37.5-25 MG per tablet, Take 1 tablet by mouth daily.  , Disp: , Rfl:  .  vitamin C (ASCORBIC ACID) 500 MG tablet, Take 500 mg by mouth daily.  , Disp: , Rfl:  .  Vitamin D, Ergocalciferol, (DRISDOL) 50000 UNITS CAPS, Take 50,000 Units by mouth every 7 (seven) days. Take on Friday , Disp: , Rfl:  .  zolpidem (AMBIEN CR) 12.5 MG CR tablet, Take 12.5 mg by mouth at bedtime., Disp: , Rfl:   Allergies: No Known Allergies  Past Medical History, Surgical history, Social history, and Family History were reviewed and updated.  Review of Systems: Review of Systems  Constitutional: Negative.  Negative for appetite change, fatigue, fever and unexpected weight change.  HENT:  Negative.  Negative for lump/mass, mouth sores, sore throat and trouble swallowing.   Eyes: Negative.  Respiratory: Negative.  Negative for cough, hemoptysis and shortness of breath.   Cardiovascular: Negative.  Negative for leg swelling and palpitations.  Gastrointestinal: Negative.  Negative for abdominal distention, abdominal pain, blood in stool, constipation, diarrhea, nausea and vomiting.  Endocrine: Negative.   Genitourinary: Negative.  Negative for bladder incontinence, dysuria, frequency and hematuria.   Musculoskeletal: Positive for myalgias. Negative for arthralgias, back pain and gait problem.  Skin: Negative.  Negative for itching and rash.  Neurological: Positive for headaches. Negative for dizziness, extremity weakness, gait problem, numbness, seizures and speech difficulty.  Hematological: Negative.  Does not bruise/bleed easily.  Psychiatric/Behavioral: Negative.  Negative for depression and sleep disturbance. The patient  is not nervous/anxious.     Physical Exam:  weight is 138 lb (62.6 kg). Her oral temperature is 97.1 F (36.2 C) (abnormal). Her blood pressure is 147/87 (abnormal) and her pulse is 76. Her respiration is 19 and oxygen saturation is 98%.   Wt Readings from Last 3 Encounters:  11/29/18 138 lb (62.6 kg)  10/02/18 137 lb 6.4 oz (62.3 kg)  08/13/18 140 lb 12.8 oz (63.9 kg)    Physical Exam   Lab Results  Component Value Date   WBC 5.2 11/29/2018   HGB 13.4 11/29/2018   HCT 40.6 11/29/2018   MCV 96.4 11/29/2018   PLT 313 11/29/2018     Chemistry      Component Value Date/Time   NA 134 (L) 11/29/2018 0855   K 3.8 11/29/2018 0855   CL 95 (L) 11/29/2018 0855   CO2 31 11/29/2018 0855   BUN 25 (H) 11/29/2018 0855   CREATININE 1.00 11/29/2018 0855      Component Value Date/Time   CALCIUM 10.1 11/29/2018 0855   ALKPHOS 82 11/29/2018 0855   AST 24 11/29/2018 0855   ALT 18 11/29/2018 0855   BILITOT 0.6 11/29/2018 0855         Impression and Plan: [name] is a 85 year old white female.  She has an IgA kappa MGUS.  This is low level.  This certainly could be from her age alone.  I really think we can get her back now in 3 months.  Her MGUS is holding pretty steady.  I just would not think that this would be causing her to have problems with fatigue.  She is not anemic.  Her renal function looks okay.  Her calcium looks fine.  She might be a little bit dehydrated so I encouraged her to drink fluids.  [name], MD 7/2/20209:53 AM

## 2018-11-30 LAB — BETA 2 MICROGLOBULIN, SERUM: Beta-2 Microglobulin: 2.8 mg/L — ABNORMAL HIGH (ref 0.6–2.4)

## 2018-11-30 LAB — KAPPA/LAMBDA LIGHT CHAINS
Kappa free light chain: 29.9 mg/L — ABNORMAL HIGH (ref 3.3–19.4)
Kappa, lambda light chain ratio: 1.47 (ref 0.26–1.65)
Lambda free light chains: 20.3 mg/L (ref 5.7–26.3)

## 2018-11-30 LAB — IGG, IGA, IGM
IgA: 376 mg/dL (ref 64–422)
IgG (Immunoglobin G), Serum: 792 mg/dL (ref 586–1602)
IgM (Immunoglobulin M), Srm: 77 mg/dL (ref 26–217)

## 2018-12-04 LAB — PROTEIN ELECTROPHORESIS, SERUM, WITH REFLEX
A/G Ratio: 1.4 (ref 0.7–1.7)
Albumin ELP: 3.8 g/dL (ref 2.9–4.4)
Alpha-1-Globulin: 0.2 g/dL (ref 0.0–0.4)
Alpha-2-Globulin: 0.8 g/dL (ref 0.4–1.0)
Beta Globulin: 0.9 g/dL (ref 0.7–1.3)
Gamma Globulin: 0.8 g/dL (ref 0.4–1.8)
Globulin, Total: 2.7 g/dL (ref 2.2–3.9)
M-Spike, %: 0.2 g/dL — ABNORMAL HIGH
SPEP Interpretation: 0
Total Protein ELP: 6.5 g/dL (ref 6.0–8.5)

## 2018-12-04 LAB — IMMUNOFIXATION REFLEX, SERUM
IgA: 486 mg/dL — ABNORMAL HIGH (ref 64–422)
IgG (Immunoglobin G), Serum: 978 mg/dL (ref 586–1602)
IgM (Immunoglobulin M), Srm: 89 mg/dL (ref 26–217)

## 2018-12-05 ENCOUNTER — Telehealth: Payer: Self-pay | Admitting: *Deleted

## 2018-12-05 NOTE — Telephone Encounter (Signed)
Left message to notify pt per order of Dr. Marin Olp that "the myeloma protein is still very low at 0.2! This is no surprise!" Instructed pt to call office back with any questions or concerns.

## 2018-12-05 NOTE — Telephone Encounter (Signed)
-----   Message from [name], MD sent at 12/05/2018  6:07 AM EDT ----- Call - the myeloma protein is still very low at 0.2!!!  This is no surprise!!  [name]

## 2018-12-10 DIAGNOSIS — M25511 Pain in right shoulder: Secondary | ICD-10-CM | POA: Diagnosis not present

## 2018-12-10 DIAGNOSIS — M25512 Pain in left shoulder: Secondary | ICD-10-CM | POA: Diagnosis not present

## 2018-12-11 ENCOUNTER — Telehealth: Payer: Self-pay

## 2018-12-11 NOTE — Telephone Encounter (Signed)
12/14/2018 rescheduled to 12/12/18 at 9 am check in time of 830. Pt understands to wear a mask to the appt.

## 2018-12-12 ENCOUNTER — Ambulatory Visit (INDEPENDENT_AMBULATORY_CARE_PROVIDER_SITE_OTHER): Payer: Medicare Other | Admitting: Neurology

## 2018-12-12 ENCOUNTER — Encounter: Payer: Self-pay | Admitting: Neurology

## 2018-12-12 ENCOUNTER — Telehealth: Payer: Self-pay | Admitting: Neurology

## 2018-12-12 ENCOUNTER — Other Ambulatory Visit: Payer: Self-pay

## 2018-12-12 VITALS — BP 112/72 | HR 86 | Temp 97.5°F | Ht 61.0 in | Wt 138.0 lb

## 2018-12-12 DIAGNOSIS — M1990 Unspecified osteoarthritis, unspecified site: Secondary | ICD-10-CM

## 2018-12-12 DIAGNOSIS — F0781 Postconcussional syndrome: Secondary | ICD-10-CM | POA: Diagnosis not present

## 2018-12-12 MED ORDER — LEVETIRACETAM 250 MG PO TABS: 250.0000 mg | ORAL_TABLET | Freq: Two times a day (BID) | ORAL | 2 refills | Status: DC

## 2018-12-12 NOTE — Progress Notes (Signed)
Reason for visit: Peripheral neuropathy, MGUS  [name] is an 85 y.o. female  History of present illness:  [name] is an 85 year old right-handed white female with a history of a peripheral neuropathy.  The patient was found to have an IgA monoclonal antibody, she has been followed through oncology for this MGUS.  Fortunately, a recent PET scan was unremarkable.  The patient apparently fell on 25 Oct 2018, she sustained a left orbital blowout fracture, she has not had any double vision following this.  She has felt somewhat foggy headed, she has difficulty concentrating since the event, she reports no significant headaches with exception of some discomfort in the left frontotemporal area.  The patient apparently had elevation in her liver enzymes and had to come off of Cymbalta and had to reduce her Crestor dosing.  The patient does have some nocturnal leg cramps but this has improved with her reduced Crestor dose, she will occasionally take baclofen at night.  The patient is concerned about arthritis in her hands, she occasionally will have some ankle swelling as well but she is on amlodipine currently.  In the past, she has not tolerated Lyrica or gabapentin, the Cymbalta was helpful but she is not taking this now.  She has not had any further falls.  She is having some discomfort with the neuropathy throughout the day and in the evening.  Past Medical History:  Diagnosis Date  . Arthritis   . Complication of anesthesia    Elevated blood pressure after having last Kyphoplasty; pt stated "I was given Morphine and had to stay overnight"  . DI (detrusor instability)   . Fracture of vertebra    x 3  . GERD (gastroesophageal reflux disease)   . History of hiatal hernia   . Hypertension   . Hypothyroidism   . Menopausal symptoms   . MGUS (monoclonal gammopathy of unknown significance) 12/27/2017   IgA  . Osteoporosis   . Peripheral neuropathy 12/27/2017  . Restless leg syndrome   .  Varicose veins     Past Surgical History:  Procedure Laterality Date  . ABDOMINAL HYSTERECTOMY  1992   TAH,BSO  . BLADDER SUSPENSION  2011   CRYOMESH  . CARPAL TUNNEL RELEASE Bilateral 1982  . CHOLECYSTECTOMY  1992  . COLONOSCOPY    . EYE SURGERY Bilateral    Cataract removal  . KYPHOPLASTY     X 3  . LUMBAR LAMINECTOMY WITH COFLEX 1 LEVEL N/A 12/29/2015   Procedure: L3-4 Laminectomy with Coflex;  Surgeon: Kristeen Miss, MD;  Location: [facility];  Service: Neurosurgery;  Laterality: N/A;  L3-4 Laminectomy with coflex  . OOPHORECTOMY     BSO  . REPLACEMENT TOTAL KNEE  2008,  2011   RIGHT 2008, LEFT 2011  . SPINAL FUSION  2011  . VERTEBRAL SURGERY     T-8, T-10. T-12  DR Roselee Culver    Family History  Problem Relation Age of Onset  . Heart disease Mother   . Heart disease Father   . Stroke Father   . Hypertension Sister   . Diabetes Sister   . Cancer Sister        OVARIAN CA  . Hypertension Brother   . Heart disease Brother   . Diabetes Sister   . Cancer Sister        Colon  . Heart disease Brother   . Hypertension Brother     Social history:  reports that she has never smoked.  She has never used smokeless tobacco. She reports that she does not drink alcohol or use drugs.   No Known Allergies  Medications:  Prior to Admission medications   Medication Sig Start Date End Date Taking? Authorizing Provider  ALPRAZolam Duanne Moron) 0.5 MG tablet Take 0.5 mg by mouth 2 (two) times daily as needed (restless legs).   Yes [provider]  aspirin 81 MG tablet Take 81 mg by mouth daily.   Yes [provider]  calcium carbonate (OS-CAL - DOSED IN MG OF ELEMENTAL CALCIUM) 1250 MG tablet Take 1 tablet by mouth daily.     Yes [provider]  CRESTOR 5 MG tablet Take 5 mg by mouth at bedtime.  02/19/14  Yes [provider]  dexlansoprazole (DEXILANT) 60 MG capsule Take 60 mg by mouth daily.   Yes [provider]  escitalopram (LEXAPRO) 10 MG  tablet Take 10 mg by mouth daily.   Yes [provider]  levothyroxine (SYNTHROID, LEVOTHROID) 25 MCG tablet Take 25 mcg by mouth daily.     Yes [provider]  MAGNESIUM PO Take 1 tablet by mouth daily.   Yes [provider]  metoprolol succinate (TOPROL-XL) 25 MG 24 hr tablet One tablet by mouth (25 mg ) in the am. One half tablet by mouth (12.5 mg ) pm 09/05/18  Yes Burtis Junes, NP  Multiple Vitamin (MULTIVITAMIN) tablet Take 1 tablet by mouth daily.     Yes [provider]  NORVASC 5 MG tablet Take 1 tablet by mouth daily. 02/02/15  Yes [provider]  triamterene-hydrochlorothiazide (MAXZIDE-25) 37.5-25 MG per tablet Take 1 tablet by mouth daily.     Yes [provider]  vitamin C (ASCORBIC ACID) 500 MG tablet Take 500 mg by mouth daily.     Yes [provider]  Vitamin D, Ergocalciferol, (DRISDOL) 50000 UNITS CAPS Take 50,000 Units by mouth every 7 (seven) days. Take on Friday    Yes [provider]  zolpidem (AMBIEN CR) 12.5 MG CR tablet Take 12.5 mg by mouth at bedtime.   Yes [provider]    ROS:  Out of a complete 14 system review of symptoms, the patient complains only of the following symptoms, and all other reviewed systems are negative.  Balance problems Headache Difficulty concentrating  Blood pressure 112/72, pulse 86, temperature (!) 97.5 F (36.4 C), temperature source Temporal, height 5\' 1"  (1.549 m), weight 138 lb (62.6 kg).  Physical Exam  General: The patient is alert and cooperative at the time of the examination.  Skin: No significant peripheral edema is noted.   Neurologic Exam  Mental status: The patient is alert and oriented x 3 at the time of the examination. The patient has apparent normal recent and remote memory, with an apparently normal attention span and concentration ability.   Cranial nerves: Facial symmetry is present. Speech is normal, no aphasia or dysarthria  is noted. Extraocular movements are full. Visual fields are full.  Motor: The patient has good strength in all 4 extremities.  Sensory examination: Soft touch sensation is symmetric on the face, arms, and legs.  Coordination: The patient has good finger-nose-finger and heel-to-shin bilaterally.  Gait and station: The patient has a normal gait. Tandem gait is slightly unsteady. Romberg is negative. No drift is seen.  Reflexes: Deep tendon reflexes are symmetric.   Assessment/Plan:  1.  Peripheral neuropathy  2. MGUS, IgA monoclonal antibody  3.  Mild gait disorder  4.  Recent fall, postconcussive syndrome  The patient will be sent for a repeat CT of the brain as she is reporting some ongoing headache and difficulty with focusing, we need to rule out a subdural hematoma.  The patient will be sent for blood work today, she is concerned that she has rheumatoid arthritis.  The patient will be placed on Keppra taking 250 mg twice daily for her peripheral neuropathy discomfort.  A prescription was sent in, she will call for any dose adjustments.  She will follow-up otherwise in 5 or 6 months.  Jill Alexanders MD 12/12/2018 9:37 AM  [facility] [address] [place], [place] [address]  Phone [phone] Fax [phone]

## 2018-12-12 NOTE — Telephone Encounter (Signed)
Medicare/BCBS auth: NPR via bcbs website order sent to GI. They will reach out to the patient to schedule.

## 2018-12-13 LAB — RHEUMATOID FACTOR: Rheumatoid fact SerPl-aCnc: <10 IU/mL (ref 0.0–13.9)

## 2018-12-14 ENCOUNTER — Ambulatory Visit: Payer: Medicare Other | Admitting: Neurology

## 2018-12-20 ENCOUNTER — Ambulatory Visit
Admission: RE | Admit: 2018-12-20 | Discharge: 2018-12-20 | Disposition: A | Discharge status: Home / Self Care | Payer: Medicare Other | Source: Ambulatory Visit | Attending: Neurology | Admitting: Neurology

## 2018-12-20 ENCOUNTER — Other Ambulatory Visit: Payer: Self-pay

## 2018-12-20 ENCOUNTER — Telehealth: Payer: Self-pay | Admitting: Neurology

## 2018-12-20 DIAGNOSIS — F0781 Postconcussional syndrome: Secondary | ICD-10-CM

## 2018-12-20 NOTE — Telephone Encounter (Signed)
I called the patient.  CT of the head was unremarkable, no evidence of subdural hematoma.   CT head results 12/20/18:  IMPRESSION: This CT scan of the head without contrast shows the following: 1.   Scattered T2/flair hyperintense foci in the hemispheres with confluencies in the periatrial white matter consistent with chronic microvascular ischemic changes.  The extent appears unchanged compared to the 05/29/2018 MRI. 2.   There are no acute findings.

## 2018-12-26 ENCOUNTER — Other Ambulatory Visit: Payer: Self-pay | Admitting: Family

## 2018-12-26 DIAGNOSIS — M542 Cervicalgia: Secondary | ICD-10-CM

## 2018-12-26 DIAGNOSIS — D472 Monoclonal gammopathy: Secondary | ICD-10-CM

## 2019-01-01 DIAGNOSIS — G609 Hereditary and idiopathic neuropathy, unspecified: Secondary | ICD-10-CM | POA: Diagnosis not present

## 2019-01-01 DIAGNOSIS — B351 Tinea unguium: Secondary | ICD-10-CM | POA: Diagnosis not present

## 2019-01-01 DIAGNOSIS — M79676 Pain in unspecified toe(s): Secondary | ICD-10-CM | POA: Diagnosis not present

## 2019-01-01 DIAGNOSIS — L84 Corns and callosities: Secondary | ICD-10-CM | POA: Diagnosis not present

## 2019-01-07 ENCOUNTER — Other Ambulatory Visit (HOSPITAL_BASED_OUTPATIENT_CLINIC_OR_DEPARTMENT_OTHER): Payer: Self-pay | Admitting: Internal Medicine

## 2019-01-07 ENCOUNTER — Other Ambulatory Visit: Payer: Medicare Other

## 2019-01-08 ENCOUNTER — Inpatient Hospital Stay: Payer: Medicare Other | Attending: Hematology & Oncology

## 2019-01-08 ENCOUNTER — Other Ambulatory Visit: Payer: Self-pay | Admitting: *Deleted

## 2019-01-08 ENCOUNTER — Telehealth: Payer: Self-pay | Admitting: Hematology & Oncology

## 2019-01-08 ENCOUNTER — Other Ambulatory Visit: Payer: Self-pay

## 2019-01-08 DIAGNOSIS — C9000 Multiple myeloma not having achieved remission: Secondary | ICD-10-CM

## 2019-01-08 LAB — CMP (CANCER CENTER ONLY)
ALT: 17 U/L (ref 0–44)
AST: 23 U/L (ref 15–41)
Albumin: 3.8 g/dL (ref 3.5–5.0)
Alkaline Phosphatase: 67 U/L (ref 38–126)
Anion gap: 7 (ref 5–15)
BUN: 19 mg/dL (ref 8–23)
CO2: 30 mmol/L (ref 22–32)
Calcium: 9.2 mg/dL (ref 8.9–10.3)
Chloride: 98 mmol/L (ref 98–111)
Creatinine: 0.91 mg/dL (ref 0.44–1.00)
GFR, Est AFR Am: >60 mL/min (ref >60)
GFR, Estimated: 59 mL/min — ABNORMAL LOW (ref >60)
Glucose, Bld: 99 mg/dL (ref 70–99)
Potassium: 4.1 mmol/L (ref 3.5–5.1)
Sodium: 135 mmol/L (ref 135–145)
Total Bilirubin: 0.5 mg/dL (ref 0.3–1.2)
Total Protein: 6.1 g/dL — ABNORMAL LOW (ref 6.5–8.1)

## 2019-01-08 LAB — CBC WITH DIFFERENTIAL (CANCER CENTER ONLY)
Abs Immature Granulocytes: 0 10*3/uL (ref 0.00–0.07)
Basophils Absolute: 0 10*3/uL (ref 0.0–0.1)
Basophils Relative: 0 %
Eosinophils Absolute: 0.2 10*3/uL (ref 0.0–0.5)
Eosinophils Relative: 4 %
HCT: 39.8 % (ref 36.0–46.0)
Hemoglobin: 13 g/dL (ref 12.0–15.0)
Immature Granulocytes: 0 %
Lymphocytes Relative: 33 %
Lymphs Abs: 1.6 10*3/uL (ref 0.7–4.0)
MCH: 31.6 pg (ref 26.0–34.0)
MCHC: 32.7 g/dL (ref 30.0–36.0)
MCV: 96.6 fL (ref 80.0–100.0)
Monocytes Absolute: 0.7 10*3/uL (ref 0.1–1.0)
Monocytes Relative: 14 %
Neutro Abs: 2.3 10*3/uL (ref 1.7–7.7)
Neutrophils Relative %: 49 %
Platelet Count: 299 10*3/uL (ref 150–400)
RBC: 4.12 MIL/uL (ref 3.87–5.11)
RDW: 12.6 % (ref 11.5–15.5)
WBC Count: 4.8 10*3/uL (ref 4.0–10.5)
nRBC: 0 % (ref 0.0–0.2)

## 2019-01-08 NOTE — Telephone Encounter (Signed)
I received a call from [place] that patient wanted to go to [place] to have MRI.  I called and their first available appointment is not until September.  Patient does not want to wait that long and will call Kathlene November @ [place] and have scans either here or @ [place].

## 2019-01-12 ENCOUNTER — Other Ambulatory Visit (HOSPITAL_BASED_OUTPATIENT_CLINIC_OR_DEPARTMENT_OTHER): Payer: Medicare Other

## 2019-01-14 ENCOUNTER — Encounter: Payer: Self-pay | Admitting: *Deleted

## 2019-01-14 ENCOUNTER — Other Ambulatory Visit: Payer: Self-pay

## 2019-01-14 ENCOUNTER — Ambulatory Visit (INDEPENDENT_AMBULATORY_CARE_PROVIDER_SITE_OTHER): Payer: Medicare Other

## 2019-01-14 DIAGNOSIS — M4322 Fusion of spine, cervical region: Secondary | ICD-10-CM | POA: Diagnosis not present

## 2019-01-14 DIAGNOSIS — M542 Cervicalgia: Secondary | ICD-10-CM

## 2019-01-14 DIAGNOSIS — D472 Monoclonal gammopathy: Secondary | ICD-10-CM | POA: Diagnosis not present

## 2019-01-14 MED ORDER — GADOBUTROL 1 MMOL/ML IV SOLN
6.0000 mL | Freq: Once | INTRAVENOUS | Status: AC | PRN
  Administered 2019-01-14: 6 mL via INTRAVENOUS

## 2019-01-28 DIAGNOSIS — H0015 Chalazion left lower eyelid: Secondary | ICD-10-CM | POA: Diagnosis not present

## 2019-01-28 DIAGNOSIS — H0012 Chalazion right lower eyelid: Secondary | ICD-10-CM | POA: Diagnosis not present

## 2019-01-28 DIAGNOSIS — H0011 Chalazion right upper eyelid: Secondary | ICD-10-CM | POA: Diagnosis not present

## 2019-02-12 DIAGNOSIS — H0011 Chalazion right upper eyelid: Secondary | ICD-10-CM | POA: Diagnosis not present

## 2019-02-12 DIAGNOSIS — H0015 Chalazion left lower eyelid: Secondary | ICD-10-CM | POA: Diagnosis not present

## 2019-02-12 DIAGNOSIS — H0012 Chalazion right lower eyelid: Secondary | ICD-10-CM | POA: Diagnosis not present

## 2019-02-14 ENCOUNTER — Inpatient Hospital Stay: Payer: Medicare Other | Attending: Hematology & Oncology | Admitting: Hematology & Oncology

## 2019-02-14 ENCOUNTER — Other Ambulatory Visit: Payer: Self-pay

## 2019-02-14 ENCOUNTER — Inpatient Hospital Stay: Payer: Medicare Other

## 2019-02-14 ENCOUNTER — Encounter: Payer: Self-pay | Admitting: Hematology & Oncology

## 2019-02-14 VITALS — BP 141/78 | HR 85 | Temp 96.9°F | Resp 16 | Wt 137.0 lb

## 2019-02-14 DIAGNOSIS — Z79899 Other long term (current) drug therapy: Secondary | ICD-10-CM | POA: Insufficient documentation

## 2019-02-14 DIAGNOSIS — M791 Myalgia, unspecified site: Secondary | ICD-10-CM | POA: Insufficient documentation

## 2019-02-14 DIAGNOSIS — N3281 Overactive bladder: Secondary | ICD-10-CM | POA: Diagnosis not present

## 2019-02-14 DIAGNOSIS — S0285XA Fracture of orbit, unspecified, initial encounter for closed fracture: Secondary | ICD-10-CM | POA: Diagnosis not present

## 2019-02-14 DIAGNOSIS — S069X0A Unspecified intracranial injury without loss of consciousness, initial encounter: Secondary | ICD-10-CM | POA: Diagnosis not present

## 2019-02-14 DIAGNOSIS — D472 Monoclonal gammopathy: Secondary | ICD-10-CM | POA: Diagnosis not present

## 2019-02-14 DIAGNOSIS — R51 Headache: Secondary | ICD-10-CM | POA: Diagnosis not present

## 2019-02-14 DIAGNOSIS — R5383 Other fatigue: Secondary | ICD-10-CM | POA: Diagnosis not present

## 2019-02-14 DIAGNOSIS — Z9181 History of falling: Secondary | ICD-10-CM | POA: Diagnosis not present

## 2019-02-14 DIAGNOSIS — W19XXXA Unspecified fall, initial encounter: Secondary | ICD-10-CM | POA: Diagnosis not present

## 2019-02-14 LAB — CMP (CANCER CENTER ONLY)
ALT: 19 U/L (ref 0–44)
AST: 25 U/L (ref 15–41)
Albumin: 4.2 g/dL (ref 3.5–5.0)
Alkaline Phosphatase: 70 U/L (ref 38–126)
Anion gap: 7 (ref 5–15)
BUN: 21 mg/dL (ref 8–23)
CO2: 31 mmol/L (ref 22–32)
Calcium: 9.9 mg/dL (ref 8.9–10.3)
Chloride: 95 mmol/L — ABNORMAL LOW (ref 98–111)
Creatinine: 1 mg/dL (ref 0.44–1.00)
GFR, Est AFR Am: >60 mL/min (ref >60)
GFR, Estimated: 52 mL/min — ABNORMAL LOW (ref >60)
Glucose, Bld: 83 mg/dL (ref 70–99)
Potassium: 3.8 mmol/L (ref 3.5–5.1)
Sodium: 133 mmol/L — ABNORMAL LOW (ref 135–145)
Total Bilirubin: 0.5 mg/dL (ref 0.3–1.2)
Total Protein: 6.6 g/dL (ref 6.5–8.1)

## 2019-02-14 LAB — CBC WITH DIFFERENTIAL (CANCER CENTER ONLY)
Abs Immature Granulocytes: 0.01 10*3/uL (ref 0.00–0.07)
Basophils Absolute: 0 10*3/uL (ref 0.0–0.1)
Basophils Relative: 0 %
Eosinophils Absolute: 0.2 10*3/uL (ref 0.0–0.5)
Eosinophils Relative: 5 %
HCT: 41 % (ref 36.0–46.0)
Hemoglobin: 13.8 g/dL (ref 12.0–15.0)
Immature Granulocytes: 0 %
Lymphocytes Relative: 39 %
Lymphs Abs: 1.7 10*3/uL (ref 0.7–4.0)
MCH: 32 pg (ref 26.0–34.0)
MCHC: 33.7 g/dL (ref 30.0–36.0)
MCV: 95.1 fL (ref 80.0–100.0)
Monocytes Absolute: 0.7 10*3/uL (ref 0.1–1.0)
Monocytes Relative: 15 %
Neutro Abs: 1.8 10*3/uL (ref 1.7–7.7)
Neutrophils Relative %: 41 %
Platelet Count: 291 10*3/uL (ref 150–400)
RBC: 4.31 MIL/uL (ref 3.87–5.11)
RDW: 12.3 % (ref 11.5–15.5)
WBC Count: 4.4 10*3/uL (ref 4.0–10.5)
nRBC: 0 % (ref 0.0–0.2)

## 2019-02-14 NOTE — Progress Notes (Signed)
Hematology and Oncology Follow Up Visit  [name] [id number] 05/22/1934 85 y.o. 02/14/2019   Principle Diagnosis:   IgA Kappa MGUS  Current Therapy:    Observation     Interim History:  [name] is back for follow-up.  So far, she is doing pretty good.  She is managing to get through the summer without the coronavirus.  She has been very cautious.  She is still recovering from this fall that she had and the fracture of her left orbit.  She has headaches from this.  She is taking Tylenol for this.  Her myeloma studies are holding steady.  Her last monoclonal spike was 0.2 g/dL.  Her IgG level was 800 mg/dL.  She is had no problems with cough.  There is no bleeding.  There is no change in bowel or bladder habits.  She has had no rashes.  Overall, her performance status is ECOG 1.  Medications:  Current Outpatient Medications:  .  ALPRAZolam (XANAX) 0.5 MG tablet, Take 0.5 mg by mouth 2 (two) times daily as needed (restless legs)., Disp: , Rfl:  .  aspirin 81 MG tablet, Take 81 mg by mouth daily., Disp: , Rfl:  .  calcium carbonate (OS-CAL - DOSED IN MG OF ELEMENTAL CALCIUM) 1250 MG tablet, Take 1 tablet by mouth daily.  , Disp: , Rfl:  .  dexlansoprazole (DEXILANT) 60 MG capsule, Take 60 mg by mouth daily., Disp: , Rfl:  .  escitalopram (LEXAPRO) 10 MG tablet, Take 10 mg by mouth daily., Disp: , Rfl:  .  levothyroxine (SYNTHROID, LEVOTHROID) 25 MCG tablet, Take 25 mcg by mouth daily.  , Disp: , Rfl:  .  MAGNESIUM PO, Take 1 tablet by mouth daily., Disp: , Rfl:  .  metoprolol succinate (TOPROL-XL) 25 MG 24 hr tablet, One tablet by mouth (25 mg ) in the am. One half tablet by mouth (12.5 mg ) pm, Disp: 90 tablet, Rfl: 3 .  Multiple Vitamin (MULTIVITAMIN) tablet, Take 1 tablet by mouth daily.  , Disp: , Rfl:  .  NORVASC 5 MG tablet, Take 1 tablet by mouth daily., Disp: , Rfl:  .  triamterene-hydrochlorothiazide (MAXZIDE-25) 37.5-25 MG per tablet, Take 1 tablet by mouth  daily.  , Disp: , Rfl:  .  vitamin C (ASCORBIC ACID) 500 MG tablet, Take 500 mg by mouth daily.  , Disp: , Rfl:  .  Vitamin D, Ergocalciferol, (DRISDOL) 50000 UNITS CAPS, Take 50,000 Units by mouth every 7 (seven) days. Take on Friday , Disp: , Rfl:  .  zolpidem (AMBIEN CR) 12.5 MG CR tablet, Take 12.5 mg by mouth at bedtime., Disp: , Rfl:   Allergies: No Known Allergies  Past Medical History, Surgical history, Social history, and Family History were reviewed and updated.  Review of Systems: Review of Systems  Constitutional: Negative.  Negative for appetite change, fatigue, fever and unexpected weight change.  HENT:  Negative.  Negative for lump/mass, mouth sores, sore throat and trouble swallowing.   Eyes: Negative.   Respiratory: Negative.  Negative for cough, hemoptysis and shortness of breath.   Cardiovascular: Negative.  Negative for leg swelling and palpitations.  Gastrointestinal: Negative.  Negative for abdominal distention, abdominal pain, blood in stool, constipation, diarrhea, nausea and vomiting.  Endocrine: Negative.   Genitourinary: Negative.  Negative for bladder incontinence, dysuria, frequency and hematuria.   Musculoskeletal: Positive for myalgias. Negative for arthralgias, back pain and gait problem.  Skin: Negative.  Negative for itching and rash.  Neurological: Positive for headaches. Negative for dizziness, extremity weakness, gait problem, numbness, seizures and speech difficulty.  Hematological: Negative.  Does not bruise/bleed easily.  Psychiatric/Behavioral: Negative.  Negative for depression and sleep disturbance. The patient is not nervous/anxious.     Physical Exam:  weight is 137 lb (62.1 kg). Her temporal temperature is 96.9 F (36.1 C) (abnormal). Her blood pressure is 141/78 (abnormal) and her pulse is 85. Her respiration is 16 and oxygen saturation is 96%.   Wt Readings from Last 3 Encounters:  02/14/19 137 lb (62.1 kg)  12/12/18 138 lb (62.6 kg)   11/29/18 138 lb (62.6 kg)    Physical Exam Vitals signs reviewed.  HENT:     Head: Normocephalic and atraumatic.  Eyes:     Pupils: Pupils are equal, round, and reactive to light.  Neck:     Musculoskeletal: Normal range of motion.  Cardiovascular:     Rate and Rhythm: Normal rate and regular rhythm.     Heart sounds: Normal heart sounds.  Pulmonary:     Effort: Pulmonary effort is normal.     Breath sounds: Normal breath sounds.  Abdominal:     General: Bowel sounds are normal.     Palpations: Abdomen is soft.  Musculoskeletal: Normal range of motion.        General: No tenderness or deformity.  Lymphadenopathy:     Cervical: No cervical adenopathy.  Skin:    General: Skin is warm and dry.     Findings: No erythema or rash.  Neurological:     Mental Status: She is alert and oriented to person, place, and time.  Psychiatric:        Behavior: Behavior normal.        Thought Content: Thought content normal.        Judgment: Judgment normal.      Lab Results  Component Value Date   WBC 4.4 02/14/2019   HGB 13.8 02/14/2019   HCT 41.0 02/14/2019   MCV 95.1 02/14/2019   PLT 291 02/14/2019     Chemistry      Component Value Date/Time   NA 133 (L) 02/14/2019 0805   K 3.8 02/14/2019 0805   CL 95 (L) 02/14/2019 0805   CO2 31 02/14/2019 0805   BUN 21 02/14/2019 0805   CREATININE 1.00 02/14/2019 0805      Component Value Date/Time   CALCIUM 9.9 02/14/2019 0805   ALKPHOS 70 02/14/2019 0805   AST 25 02/14/2019 0805   ALT 19 02/14/2019 0805   BILITOT 0.5 02/14/2019 0805         Impression and Plan: [name] is a 85 year old white female.  She has an IgA kappa MGUS.  This is low level.  This certainly could be from her age alone.  I really think we can get her back now in 3 months.  Her MGUS is holding pretty steady.  I just would not think that this would be causing her to have problems with fatigue.  She is not anemic.  Her renal function looks okay.  Her  calcium looks fine.  She might be a little bit dehydrated so I encouraged her to drink fluids.  [name], MD 9/17/20208:57 AM

## 2019-02-15 LAB — IGG, IGA, IGM
IgA: 359 mg/dL (ref 64–422)
IgG (Immunoglobin G), Serum: 741 mg/dL (ref 586–1602)
IgM (Immunoglobulin M), Srm: 72 mg/dL (ref 26–217)

## 2019-02-15 LAB — KAPPA/LAMBDA LIGHT CHAINS
Kappa free light chain: 28.4 mg/L — ABNORMAL HIGH (ref 3.3–19.4)
Kappa, lambda light chain ratio: 1.16 (ref 0.26–1.65)
Lambda free light chains: 24.4 mg/L (ref 5.7–26.3)

## 2019-02-16 DIAGNOSIS — Z23 Encounter for immunization: Secondary | ICD-10-CM | POA: Diagnosis not present

## 2019-02-18 LAB — IMMUNOFIXATION REFLEX, SERUM
IgA: 378 mg/dL (ref 64–422)
IgG (Immunoglobin G), Serum: 786 mg/dL (ref 586–1602)
IgM (Immunoglobulin M), Srm: 71 mg/dL (ref 26–217)

## 2019-02-18 LAB — PROTEIN ELECTROPHORESIS, SERUM, WITH REFLEX
A/G Ratio: 1.7 (ref 0.7–1.7)
Albumin ELP: 4 g/dL (ref 2.9–4.4)
Alpha-1-Globulin: 0.2 g/dL (ref 0.0–0.4)
Alpha-2-Globulin: 0.7 g/dL (ref 0.4–1.0)
Beta Globulin: 0.9 g/dL (ref 0.7–1.3)
Gamma Globulin: 0.7 g/dL (ref 0.4–1.8)
Globulin, Total: 2.4 g/dL (ref 2.2–3.9)
M-Spike, %: 0.2 g/dL — ABNORMAL HIGH
SPEP Interpretation: 0
Total Protein ELP: 6.4 g/dL (ref 6.0–8.5)

## 2019-02-19 ENCOUNTER — Encounter: Payer: Self-pay | Admitting: *Deleted

## 2019-02-27 DIAGNOSIS — H0100B Unspecified blepharitis left eye, upper and lower eyelids: Secondary | ICD-10-CM | POA: Diagnosis not present

## 2019-02-27 DIAGNOSIS — H0100A Unspecified blepharitis right eye, upper and lower eyelids: Secondary | ICD-10-CM | POA: Diagnosis not present

## 2019-03-12 DIAGNOSIS — B351 Tinea unguium: Secondary | ICD-10-CM | POA: Diagnosis not present

## 2019-03-12 DIAGNOSIS — G609 Hereditary and idiopathic neuropathy, unspecified: Secondary | ICD-10-CM | POA: Diagnosis not present

## 2019-03-12 DIAGNOSIS — M79676 Pain in unspecified toe(s): Secondary | ICD-10-CM | POA: Diagnosis not present

## 2019-03-12 DIAGNOSIS — L84 Corns and callosities: Secondary | ICD-10-CM | POA: Diagnosis not present

## 2019-04-08 DIAGNOSIS — M810 Age-related osteoporosis without current pathological fracture: Secondary | ICD-10-CM | POA: Diagnosis not present

## 2019-04-08 DIAGNOSIS — R82998 Other abnormal findings in urine: Secondary | ICD-10-CM | POA: Diagnosis not present

## 2019-04-08 DIAGNOSIS — E7849 Other hyperlipidemia: Secondary | ICD-10-CM | POA: Diagnosis not present

## 2019-04-08 DIAGNOSIS — I10 Essential (primary) hypertension: Secondary | ICD-10-CM | POA: Diagnosis not present

## 2019-04-08 DIAGNOSIS — E038 Other specified hypothyroidism: Secondary | ICD-10-CM | POA: Diagnosis not present

## 2019-04-09 NOTE — Progress Notes (Signed)
Cardiology Office Note:    Date:  04/10/2019   ID:  [name], DOB Sep 30, 1933, MRN [id number]  PCP:  Burnard Bunting, MD  Cardiologist:  Sinclair Grooms, MD   Referring MD: Burnard Bunting, MD   Chief Complaint  Patient presents with   Coronary Artery Disease   Hyperlipidemia   Hypertension    History of Present Illness:    [name] is a 85 y.o. female with a hx of nonobstructive coronary disease, hypertension, and hyperlipidemia.  She is still having a difficult time adjusting to life after the death of her dear husband, Bobby Rumpf.  She has not had chest pain.  She frequently feels weak.  She has been documenting blood pressures that at times are less than 80 mmHg systolic.  Multiple recordings have been made less than 123XX123 mmHg systolic.  The highest blood pressures have been in the upper 130s.  She denies orthopnea, PND, angina, peripheral edema.  Past Medical History:  Diagnosis Date   Arthritis    Complication of anesthesia    Elevated blood pressure after having last Kyphoplasty; pt stated "I was given Morphine and had to stay overnight"   DI (detrusor instability)    Fracture of vertebra    x 3   GERD (gastroesophageal reflux disease)    History of hiatal hernia    Hypertension    Hypothyroidism    Menopausal symptoms    MGUS (monoclonal gammopathy of unknown significance) 12/27/2017   IgA   Osteoporosis    Peripheral neuropathy 12/27/2017   Restless leg syndrome    Varicose veins     Past Surgical History:  Procedure Laterality Date   ABDOMINAL HYSTERECTOMY  1992   TAH,BSO   BLADDER SUSPENSION  2011   CRYOMESH   CARPAL TUNNEL RELEASE Bilateral 1982   CHOLECYSTECTOMY  1992   COLONOSCOPY     EYE SURGERY Bilateral    Cataract removal   KYPHOPLASTY     X 3   LUMBAR LAMINECTOMY WITH COFLEX 1 LEVEL N/A 12/29/2015   Procedure: L3-4 Laminectomy with Coflex;  Surgeon: Kristeen Miss, MD;  Location: [facility];  Service:  Neurosurgery;  Laterality: N/A;  L3-4 Laminectomy with coflex   OOPHORECTOMY     BSO   REPLACEMENT TOTAL KNEE  2008,  2011   RIGHT 2008, LEFT 2011   SPINAL FUSION  2011   VERTEBRAL SURGERY     T-8, T-10. T-12  DR Roselee Culver    Current Medications: Current Meds  Medication Sig   ALPRAZolam (XANAX) 0.5 MG tablet Take 0.5 mg by mouth 2 (two) times daily as needed (restless legs).   aspirin 81 MG tablet Take 81 mg by mouth daily.   calcium carbonate (OS-CAL - DOSED IN MG OF ELEMENTAL CALCIUM) 1250 MG tablet Take 1 tablet by mouth daily.     dexlansoprazole (DEXILANT) 60 MG capsule Take 60 mg by mouth daily.   escitalopram (LEXAPRO) 10 MG tablet Take 10 mg by mouth daily.   levothyroxine (SYNTHROID, LEVOTHROID) 25 MCG tablet Take 25 mcg by mouth daily.     MAGNESIUM PO Take 1 tablet by mouth daily.   metoprolol succinate (TOPROL-XL) 25 MG 24 hr tablet Take 25 mg by mouth daily.   Multiple Vitamin (MULTIVITAMIN) tablet Take 1 tablet by mouth daily.     rosuvastatin (CRESTOR) 5 MG tablet Take 5 mg by mouth once a week.   triamterene-hydrochlorothiazide (MAXZIDE-25) 37.5-25 MG per tablet Take 1 tablet by mouth daily.  vitamin C (ASCORBIC ACID) 500 MG tablet Take 500 mg by mouth daily.     Vitamin D, Ergocalciferol, (DRISDOL) 50000 UNITS CAPS Take 50,000 Units by mouth every 7 (seven) days. Take on Friday    zolpidem (AMBIEN CR) 12.5 MG CR tablet Take 12.5 mg by mouth at bedtime.   [DISCONTINUED] NORVASC 5 MG tablet Take 1 tablet by mouth daily.     Allergies:   Patient has no known allergies.   Social History   Socioeconomic History   Marital status: Married    Spouse name: Not on file   Number of children: 2   Years of education: 12   Highest education level: Not on file  Occupational History   Occupation: Retired  Scientist, product/process development strain: Not on file   Food insecurity    Worry: Not on file    Inability: Not on Lexicographer  needs    Medical: Not on file    Non-medical: Not on file  Tobacco Use   Smoking status: Never Smoker   Smokeless tobacco: Never Used  Substance and Sexual Activity   Alcohol use: No   Drug use: No   Sexual activity: Yes    Birth control/protection: Surgical  Lifestyle   Physical activity    Days per week: Not on file    Minutes per session: Not on file   Stress: Not on file  Relationships   Social connections    Talks on phone: Not on file    Gets together: Not on file    Attends religious service: Not on file    Active member of club or organization: Not on file    Attends meetings of clubs or organizations: Not on file    Relationship status: Not on file  Other Topics Concern   Not on file  Social History Narrative   Lives  alone   Caffeine use: decaf only   Right handed      Family History: The patient's family history includes Cancer in her sister and sister; Diabetes in her sister and sister; Heart disease in her brother, brother, father, and mother; Hypertension in her brother, brother, and sister; Stroke in her father.  ROS:   Please see the history of present illness.    Lower extremity burning.  Recent diagnosis of peripheral neuropathy.   All other systems reviewed and are negative.  EKGs/Labs/Other Studies Reviewed:    The following studies were reviewed today: Cardiac monitor performed February 2020: Study Highlights  Sinus rhythm 31 patient symptomatic events are reviewed and are all sinus rhythm, or sinus tachycardia Rare premature atrial contractions and rare premature ventricular contractions are observed No sustained arrhythmias No atrial fibrillation     EKG:  EKG performed on 04/10/2019 demonstrates normal sinus rhythm with PAC and poor R wave progression unchanged from prior tracings.  Recent Labs: 11/29/2018: TSH 1.746 02/14/2019: ALT 19; BUN 21; Creatinine 1.00; Hemoglobin 13.8; Platelet Count 291; Potassium 3.8; Sodium 133    Recent Lipid Panel No results found for: CHOL, TRIG, HDL, CHOLHDL, VLDL, LDLCALC, LDLDIRECT  Physical Exam:    VS:  BP 98/68    Pulse 88    Ht 5\' 1"  (1.549 m)    Wt 137 lb 6.4 oz (62.3 kg)    SpO2 95%    BMI 25.96 kg/m     Wt Readings from Last 3 Encounters:  04/10/19 137 lb 6.4 oz (62.3 kg)  02/14/19 137 lb (62.1 kg)  12/12/18 138 lb (62.6 kg)     GEN: Appears compatible with age. No acute distress HEENT: Normal NECK: No JVD. LYMPHATICS: No lymphadenopathy CARDIAC:  RRR without murmur, gallop, or edema. VASCULAR:  Normal Pulses. No bruits. RESPIRATORY:  Clear to auscultation without rales, wheezing or rhonchi  ABDOMEN: Soft, non-tender, non-distended, No pulsatile mass, MUSCULOSKELETAL: No deformity  SKIN: Warm and dry NEUROLOGIC:  Alert and oriented x 3 PSYCHIATRIC:  Normal affect   ASSESSMENT:    1. Coronary artery disease involving native coronary artery of native heart without angina pectoris   2. Essential hypertension   3. Other hyperlipidemia   4. Educated about COVID-19 virus infection    PLAN:    In order of problems listed above:  1. Primary prevention discussed. 2. Discontinue Norvasc and continue to monitor blood pressures.  Target blood pressure at her current age is 140/80 mmHg or less. 3. Cholesterol is being treated with once weekly Crestor 5 mg/day.  The most recent LDL 100 is still above target but this is the best we can do.  I would not recommend PCSK9 therapy. 4. The 3W's are recognized and endorsed by the patient is lifestyle changes.  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  Overall education and awareness concerning primary/secondary risk prevention was discussed in detail: LDL less than 70, hemoglobin A1c less than 7, blood pressure target less than 130/80 mmHg, >150 minutes of moderate aerobic activity per week, avoidance of smoking, weight control (via diet and exercise), and  continued surveillance/management of/for obstructive sleep apnea.    Medication Adjustments/Labs and Tests Ordered: Current medicines are reviewed at length with the patient today.  Concerns regarding medicines are outlined above.  Orders Placed This Encounter  Procedures   EKG 12-Lead   No orders of the defined types were placed in this encounter.   Patient Instructions  Medication Instructions:  1) DISCONTINUE Amlodipine  *If you need a refill on your cardiac medications before your next appointment, please call your pharmacy*  Lab Work: None If you have labs (blood work) drawn today and your tests are completely normal, you will receive your results only by:  [place] (if you have MyChart) OR  A paper copy in the mail If you have any lab test that is abnormal or we need to change your treatment, we will call you to review the results.  Testing/Procedures: None  Follow-Up: At [facility], you and your health needs are our priority.  As part of our continuing mission to provide you with exceptional heart care, we have created designated Provider Care Teams.  These Care Teams include your primary Cardiologist (physician) and Advanced Practice Providers (APPs -  Physician Assistants and Nurse Practitioners) who all work together to provide you with the care you need, when you need it.  Your next appointment:   12 months  The format for your next appointment:   In Person  Provider:   You may see Sinclair Grooms, MD or one of the following Advanced Practice Providers on your designated Care Team:    Truitt Merle, NP  Cecilie Kicks, NP  Kathyrn Drown, NP   Other Instructions      Signed, Sinclair Grooms, MD  04/10/2019 12:07 PM    [place]

## 2019-04-10 ENCOUNTER — Other Ambulatory Visit: Payer: Self-pay

## 2019-04-10 ENCOUNTER — Encounter: Payer: Self-pay | Admitting: Interventional Cardiology

## 2019-04-10 ENCOUNTER — Ambulatory Visit (INDEPENDENT_AMBULATORY_CARE_PROVIDER_SITE_OTHER): Payer: Medicare Other | Admitting: Interventional Cardiology

## 2019-04-10 VITALS — BP 98/68 | HR 88 | Ht 61.0 in | Wt 137.4 lb

## 2019-04-10 DIAGNOSIS — Z7189 Other specified counseling: Secondary | ICD-10-CM

## 2019-04-10 DIAGNOSIS — F418 Other specified anxiety disorders: Secondary | ICD-10-CM | POA: Diagnosis not present

## 2019-04-10 DIAGNOSIS — E785 Hyperlipidemia, unspecified: Secondary | ICD-10-CM | POA: Diagnosis not present

## 2019-04-10 DIAGNOSIS — N183 Chronic kidney disease, stage 3 unspecified: Secondary | ICD-10-CM | POA: Diagnosis not present

## 2019-04-10 DIAGNOSIS — I129 Hypertensive chronic kidney disease with stage 1 through stage 4 chronic kidney disease, or unspecified chronic kidney disease: Secondary | ICD-10-CM | POA: Diagnosis not present

## 2019-04-10 DIAGNOSIS — E7849 Other hyperlipidemia: Secondary | ICD-10-CM

## 2019-04-10 DIAGNOSIS — Z1339 Encounter for screening examination for other mental health and behavioral disorders: Secondary | ICD-10-CM | POA: Diagnosis not present

## 2019-04-10 DIAGNOSIS — M810 Age-related osteoporosis without current pathological fracture: Secondary | ICD-10-CM | POA: Diagnosis not present

## 2019-04-10 DIAGNOSIS — Z23 Encounter for immunization: Secondary | ICD-10-CM | POA: Diagnosis not present

## 2019-04-10 DIAGNOSIS — G4700 Insomnia, unspecified: Secondary | ICD-10-CM | POA: Diagnosis present

## 2019-04-10 DIAGNOSIS — E039 Hypothyroidism, unspecified: Secondary | ICD-10-CM | POA: Diagnosis not present

## 2019-04-10 DIAGNOSIS — I2510 Atherosclerotic heart disease of native coronary artery without angina pectoris: Secondary | ICD-10-CM | POA: Diagnosis not present

## 2019-04-10 DIAGNOSIS — I10 Essential (primary) hypertension: Secondary | ICD-10-CM

## 2019-04-10 DIAGNOSIS — M1990 Unspecified osteoarthritis, unspecified site: Secondary | ICD-10-CM | POA: Diagnosis not present

## 2019-04-10 DIAGNOSIS — Z0000 Encounter for general adult medical examination without abnormal findings: Secondary | ICD-10-CM | POA: Diagnosis not present

## 2019-04-10 NOTE — Patient Instructions (Signed)
Medication Instructions:  1) DISCONTINUE Amlodipine  *If you need a refill on your cardiac medications before your next appointment, please call your pharmacy*  Lab Work: None If you have labs (blood work) drawn today and your tests are completely normal, you will receive your results only by: Marland Kitchen MyChart Message (if you have MyChart) OR . A paper copy in the mail If you have any lab test that is abnormal or we need to change your treatment, we will call you to review the results.  Testing/Procedures: None  Follow-Up: At [facility], you and your health needs are our priority.  As part of our continuing mission to provide you with exceptional heart care, we have created designated Provider Care Teams.  These Care Teams include your primary Cardiologist (physician) and Advanced Practice Providers (APPs -  Physician Assistants and Nurse Practitioners) who all work together to provide you with the care you need, when you need it.  Your next appointment:   12 months  The format for your next appointment:   In Person  Provider:   You may see Sinclair Grooms, MD or one of the following Advanced Practice Providers on your designated Care Team:    Truitt Merle, NP  Cecilie Kicks, NP  Kathyrn Drown, NP   Other Instructions

## 2019-04-18 ENCOUNTER — Other Ambulatory Visit: Payer: Self-pay

## 2019-04-18 ENCOUNTER — Encounter: Payer: Self-pay | Admitting: Hematology & Oncology

## 2019-04-18 ENCOUNTER — Inpatient Hospital Stay: Payer: Medicare Other | Attending: Hematology & Oncology | Admitting: Hematology & Oncology

## 2019-04-18 ENCOUNTER — Inpatient Hospital Stay: Payer: Medicare Other

## 2019-04-18 VITALS — BP 130/78 | HR 77 | Temp 96.6°F | Resp 18 | Wt 138.0 lb

## 2019-04-18 DIAGNOSIS — Z79899 Other long term (current) drug therapy: Secondary | ICD-10-CM | POA: Diagnosis not present

## 2019-04-18 DIAGNOSIS — R5383 Other fatigue: Secondary | ICD-10-CM | POA: Diagnosis not present

## 2019-04-18 DIAGNOSIS — M791 Myalgia, unspecified site: Secondary | ICD-10-CM | POA: Insufficient documentation

## 2019-04-18 DIAGNOSIS — R519 Headache, unspecified: Secondary | ICD-10-CM | POA: Diagnosis not present

## 2019-04-18 DIAGNOSIS — D472 Monoclonal gammopathy: Secondary | ICD-10-CM | POA: Diagnosis not present

## 2019-04-18 DIAGNOSIS — I2510 Atherosclerotic heart disease of native coronary artery without angina pectoris: Secondary | ICD-10-CM | POA: Diagnosis not present

## 2019-04-18 DIAGNOSIS — Z9181 History of falling: Secondary | ICD-10-CM | POA: Insufficient documentation

## 2019-04-18 DIAGNOSIS — M8448XA Pathological fracture, other site, initial encounter for fracture: Secondary | ICD-10-CM | POA: Diagnosis not present

## 2019-04-18 LAB — CMP (CANCER CENTER ONLY)
ALT: 17 U/L (ref 0–44)
AST: 21 U/L (ref 15–41)
Albumin: 4.2 g/dL (ref 3.5–5.0)
Alkaline Phosphatase: 64 U/L (ref 38–126)
Anion gap: 6 (ref 5–15)
BUN: 26 mg/dL — ABNORMAL HIGH (ref 8–23)
CO2: 32 mmol/L (ref 22–32)
Calcium: 9.8 mg/dL (ref 8.9–10.3)
Chloride: 98 mmol/L (ref 98–111)
Creatinine: 1.06 mg/dL — ABNORMAL HIGH (ref 0.44–1.00)
GFR, Est AFR Am: 56 mL/min — ABNORMAL LOW (ref >60)
GFR, Estimated: 49 mL/min — ABNORMAL LOW (ref >60)
Glucose, Bld: 86 mg/dL (ref 70–99)
Potassium: 3.9 mmol/L (ref 3.5–5.1)
Sodium: 136 mmol/L (ref 135–145)
Total Bilirubin: 0.6 mg/dL (ref 0.3–1.2)
Total Protein: 6.2 g/dL — ABNORMAL LOW (ref 6.5–8.1)

## 2019-04-18 LAB — CBC WITH DIFFERENTIAL (CANCER CENTER ONLY)
Abs Immature Granulocytes: 0.01 10*3/uL (ref 0.00–0.07)
Basophils Absolute: 0 10*3/uL (ref 0.0–0.1)
Basophils Relative: 0 %
Eosinophils Absolute: 0.2 10*3/uL (ref 0.0–0.5)
Eosinophils Relative: 4 %
HCT: 40.9 % (ref 36.0–46.0)
Hemoglobin: 13.4 g/dL (ref 12.0–15.0)
Immature Granulocytes: 0 %
Lymphocytes Relative: 38 %
Lymphs Abs: 1.8 10*3/uL (ref 0.7–4.0)
MCH: 31.3 pg (ref 26.0–34.0)
MCHC: 32.8 g/dL (ref 30.0–36.0)
MCV: 95.6 fL (ref 80.0–100.0)
Monocytes Absolute: 0.5 10*3/uL (ref 0.1–1.0)
Monocytes Relative: 11 %
Neutro Abs: 2.2 10*3/uL (ref 1.7–7.7)
Neutrophils Relative %: 47 %
Platelet Count: 281 10*3/uL (ref 150–400)
RBC: 4.28 MIL/uL (ref 3.87–5.11)
RDW: 12.8 % (ref 11.5–15.5)
WBC Count: 4.8 10*3/uL (ref 4.0–10.5)
nRBC: 0 % (ref 0.0–0.2)

## 2019-04-18 NOTE — Progress Notes (Signed)
Hematology and Oncology Follow Up Visit  [name] AP:[id number] 12/08/1933 85 y.o. 04/18/2019   Principle Diagnosis:   IgA Kappa MGUS  Current Therapy:    Observation     Interim History:  [name] is back for follow-up.  So far, she is doing pretty good.  She is managing to get through the summer without the coronavirus.  She has been very cautious.  She is planning to have a fairly large family gathering for Thanksgiving.  I think this would be okay for her.  She is still recovering from this fall that she had and the fracture of her left orbit.  She has headaches from this.  She is taking Tylenol for this.  Her myeloma studies are holding steady.  Her last monoclonal spike was 0.2 g/dL.  Her IgA level was 378 mg/dL.  Her lambda light chain was 2.4 mg/dL.  She is had no problems with cough.  There is no bleeding.  There is no change in bowel or bladder habits.  She has had no rashes.  She does have a little bit of unsteadiness.  Not sure as to why she has this.  Overall, her performance status is ECOG 1.  Medications:  Current Outpatient Medications:  .  ALPRAZolam (XANAX) 0.5 MG tablet, Take 0.5 mg by mouth 2 (two) times daily as needed (restless legs)., Disp: , Rfl:  .  aspirin 81 MG tablet, Take 81 mg by mouth daily., Disp: , Rfl:  .  calcium carbonate (OS-CAL - DOSED IN MG OF ELEMENTAL CALCIUM) 1250 MG tablet, Take 1 tablet by mouth daily.  , Disp: , Rfl:  .  dexlansoprazole (DEXILANT) 60 MG capsule, Take 60 mg by mouth daily., Disp: , Rfl:  .  escitalopram (LEXAPRO) 10 MG tablet, Take 10 mg by mouth daily., Disp: , Rfl:  .  levothyroxine (SYNTHROID, LEVOTHROID) 25 MCG tablet, Take 25 mcg by mouth daily.  , Disp: , Rfl:  .  MAGNESIUM PO, Take 1 tablet by mouth daily., Disp: , Rfl:  .  metoprolol succinate (TOPROL-XL) 25 MG 24 hr tablet, Take 25 mg by mouth daily., Disp: , Rfl:  .  Multiple Vitamin (MULTIVITAMIN) tablet, Take 1 tablet by mouth daily.  , Disp: , Rfl:   .  rosuvastatin (CRESTOR) 5 MG tablet, Take 5 mg by mouth once a week., Disp: , Rfl:  .  triamterene-hydrochlorothiazide (MAXZIDE-25) 37.5-25 MG per tablet, Take 1 tablet by mouth daily.  , Disp: , Rfl:  .  vitamin C (ASCORBIC ACID) 500 MG tablet, Take 500 mg by mouth daily.  , Disp: , Rfl:  .  Vitamin D, Ergocalciferol, (DRISDOL) 50000 UNITS CAPS, Take 50,000 Units by mouth every 7 (seven) days. Take on Friday , Disp: , Rfl:  .  zolpidem (AMBIEN CR) 12.5 MG CR tablet, Take 12.5 mg by mouth at bedtime., Disp: , Rfl:   Allergies: No Known Allergies  Past Medical History, Surgical history, Social history, and Family History were reviewed and updated.  Review of Systems: Review of Systems  Constitutional: Negative.  Negative for appetite change, fatigue, fever and unexpected weight change.  HENT:  Negative.  Negative for lump/mass, mouth sores, sore throat and trouble swallowing.   Eyes: Negative.   Respiratory: Negative.  Negative for cough, hemoptysis and shortness of breath.   Cardiovascular: Negative.  Negative for leg swelling and palpitations.  Gastrointestinal: Negative.  Negative for abdominal distention, abdominal pain, blood in stool, constipation, diarrhea, nausea and vomiting.  Endocrine: Negative.  Genitourinary: Negative.  Negative for bladder incontinence, dysuria, frequency and hematuria.   Musculoskeletal: Positive for myalgias. Negative for arthralgias, back pain and gait problem.  Skin: Negative.  Negative for itching and rash.  Neurological: Positive for headaches. Negative for dizziness, extremity weakness, gait problem, numbness, seizures and speech difficulty.  Hematological: Negative.  Does not bruise/bleed easily.  Psychiatric/Behavioral: Negative.  Negative for depression and sleep disturbance. The patient is not nervous/anxious.     Physical Exam:  weight is 138 lb (62.6 kg). Her temporal temperature is 96.6 F (35.9 C) (abnormal). Her blood pressure is 130/78  and her pulse is 77. Her respiration is 18 and oxygen saturation is 100%.   Wt Readings from Last 3 Encounters:  04/18/19 138 lb (62.6 kg)  04/10/19 137 lb 6.4 oz (62.3 kg)  02/14/19 137 lb (62.1 kg)    Physical Exam Vitals signs reviewed.  HENT:     Head: Normocephalic and atraumatic.  Eyes:     Pupils: Pupils are equal, round, and reactive to light.  Neck:     Musculoskeletal: Normal range of motion.  Cardiovascular:     Rate and Rhythm: Normal rate and regular rhythm.     Heart sounds: Normal heart sounds.  Pulmonary:     Effort: Pulmonary effort is normal.     Breath sounds: Normal breath sounds.  Abdominal:     General: Bowel sounds are normal.     Palpations: Abdomen is soft.  Musculoskeletal: Normal range of motion.        General: No tenderness or deformity.  Lymphadenopathy:     Cervical: No cervical adenopathy.  Skin:    General: Skin is warm and dry.     Findings: No erythema or rash.  Neurological:     Mental Status: She is alert and oriented to person, place, and time.  Psychiatric:        Behavior: Behavior normal.        Thought Content: Thought content normal.        Judgment: Judgment normal.      Lab Results  Component Value Date   WBC 4.8 04/18/2019   HGB 13.4 04/18/2019   HCT 40.9 04/18/2019   MCV 95.6 04/18/2019   PLT 281 04/18/2019     Chemistry      Component Value Date/Time   NA 136 04/18/2019 0806   K 3.9 04/18/2019 0806   CL 98 04/18/2019 0806   CO2 32 04/18/2019 0806   BUN 26 (H) 04/18/2019 0806   CREATININE 1.06 (H) 04/18/2019 0806      Component Value Date/Time   CALCIUM 9.8 04/18/2019 0806   ALKPHOS 64 04/18/2019 0806   AST 21 04/18/2019 0806   ALT 17 04/18/2019 0806   BILITOT 0.6 04/18/2019 0806         Impression and Plan: [name] is a 85 year old white female.  She has an IgA kappa MGUS.  This is low level.  This certainly could be from her age alone.  I really think we can get her back now in 3 months.   Her MGUS is holding pretty steady.  I just would not think that this would be causing her to have problems with fatigue.    She is not anemic.  Her renal function looks okay.  Her calcium looks fine.    She might be a little bit dehydrated so I encouraged her to drink fluids.  [name], MD 11/19/20208:49 AM

## 2019-04-19 LAB — IGG, IGA, IGM
IgA: 354 mg/dL (ref 64–422)
IgG (Immunoglobin G), Serum: 767 mg/dL (ref 586–1602)
IgM (Immunoglobulin M), Srm: 68 mg/dL (ref 26–217)

## 2019-04-19 LAB — KAPPA/LAMBDA LIGHT CHAINS
Kappa free light chain: 33 mg/L — ABNORMAL HIGH (ref 3.3–19.4)
Kappa, lambda light chain ratio: 1.46 (ref 0.26–1.65)
Lambda free light chains: 22.6 mg/L (ref 5.7–26.3)

## 2019-04-25 LAB — PROTEIN ELECTROPHORESIS, SERUM, WITH REFLEX
A/G Ratio: 1.5 (ref 0.7–1.7)
Albumin ELP: 3.9 g/dL (ref 2.9–4.4)
Alpha-1-Globulin: 0.2 g/dL (ref 0.0–0.4)
Alpha-2-Globulin: 0.6 g/dL (ref 0.4–1.0)
Beta Globulin: 1 g/dL (ref 0.7–1.3)
Gamma Globulin: 0.8 g/dL (ref 0.4–1.8)
Globulin, Total: 2.6 g/dL (ref 2.2–3.9)
M-Spike, %: 0.2 g/dL — ABNORMAL HIGH
SPEP Interpretation: 0
Total Protein ELP: 6.5 g/dL (ref 6.0–8.5)

## 2019-04-25 LAB — IMMUNOFIXATION REFLEX, SERUM
IgA: 372 mg/dL (ref 64–422)
IgG (Immunoglobin G), Serum: 756 mg/dL (ref 586–1602)
IgM (Immunoglobulin M), Srm: 73 mg/dL (ref 26–217)

## 2019-05-07 ENCOUNTER — Telehealth: Payer: Self-pay

## 2019-05-07 NOTE — Telephone Encounter (Addendum)
Letter mailed to the pt requesting she call to reschedule 06/18/19 @ 230 due to Dr. Jannifer Franklin schedule changing.

## 2019-05-13 ENCOUNTER — Telehealth: Payer: Self-pay | Admitting: Neurology

## 2019-05-13 NOTE — Telephone Encounter (Signed)
[name], would you be willing to see the pt in the office to address brain fog, decrease in memory and to discuss the possibility of having a MRI done? Pt is due for an appt and Dr. Jannifer Franklin does not have any openings at present.

## 2019-05-13 NOTE — Telephone Encounter (Signed)
Pt called in and requested a MRI of the Brain due to fogginess and memory failing, she states she is still having issues from the previous fall.

## 2019-05-14 ENCOUNTER — Other Ambulatory Visit: Payer: Self-pay | Admitting: *Deleted

## 2019-05-14 ENCOUNTER — Telehealth: Payer: Self-pay | Admitting: *Deleted

## 2019-05-14 DIAGNOSIS — M79605 Pain in left leg: Secondary | ICD-10-CM

## 2019-05-14 DIAGNOSIS — M79604 Pain in right leg: Secondary | ICD-10-CM

## 2019-05-14 DIAGNOSIS — M79671 Pain in right foot: Secondary | ICD-10-CM

## 2019-05-14 NOTE — Telephone Encounter (Signed)
Clarise Cruz provided verbal ok for pt to be seen. Pt scheduled fro 05/20/2019 at 245 pm check in time of 215.

## 2019-05-14 NOTE — Telephone Encounter (Signed)
Patient called requesting appt with Dr. Donnetta Hutching for c/o bilateral leg and foot pain, burning and ankle swelling. She was instructed to return to see him on an as needed basis. She state she now has terrible symptoms as described above. She is willing to wait until available date with Dr. Donnetta Hutching. Patient instructed to expect a call from scheduling with appt for non-invasive studies and appt. Verbalized understanding.

## 2019-05-20 ENCOUNTER — Encounter: Payer: Self-pay | Admitting: Neurology

## 2019-05-20 ENCOUNTER — Ambulatory Visit (INDEPENDENT_AMBULATORY_CARE_PROVIDER_SITE_OTHER): Payer: Medicare Other | Admitting: Neurology

## 2019-05-20 ENCOUNTER — Other Ambulatory Visit: Payer: Self-pay

## 2019-05-20 DIAGNOSIS — R410 Disorientation, unspecified: Secondary | ICD-10-CM

## 2019-05-20 DIAGNOSIS — G8929 Other chronic pain: Secondary | ICD-10-CM

## 2019-05-20 DIAGNOSIS — R519 Headache, unspecified: Secondary | ICD-10-CM | POA: Diagnosis not present

## 2019-05-20 DIAGNOSIS — F0781 Postconcussional syndrome: Secondary | ICD-10-CM | POA: Diagnosis not present

## 2019-05-20 NOTE — Progress Notes (Signed)
I have read the note, and I agree with the clinical assessment and plan.  Sanai Frick K Hasna Stefanik   

## 2019-05-20 NOTE — Patient Instructions (Signed)
I will order MRI of the brain   I will check lab work today   Return in 4 months or sooner if needed

## 2019-05-20 NOTE — Progress Notes (Signed)
PATIENT: [name] DOB: August 08, 1933  REASON FOR VISIT: follow up HISTORY FROM: patient  HISTORY OF PRESENT ILLNESS: Today 05/20/19  [name] is an 85 year old female with history of peripheral neuropathy.  She was found to have an IgA monoclonal antibody, she has been followed through oncology for this MGUS.  She recently had a CT scan after reporting difficulty focusing following a fall in May 2020.  CT of the brain was unremarkable, no evidence of subdural hematoma.  She continues to report the sensation of brain fog, that she can't think.  She denies any memory loss.  She denies visual disturbance.  She continues to complain of some pain behind her left eye, and pain to her left temple since the fall.  At times she may feel dizzy, has some mild gait instability.  She has not had any falls.  She reports the headache is mild, she will take Tylenol with benefit.  She has been unable to tolerate Keppra for her neuropathy.  She lives alone, drives a car, and performs all of her own ADLs.  She has routine follow-up with oncology, her last visit it was felt she was stable.  She presents today for evaluation unaccompanied.  HISTORY 12/12/2018 Dr. Jannifer Franklin: [name] is an 85 year old right-handed white female with a history of a peripheral neuropathy.  The patient was found to have an IgA monoclonal antibody, she has been followed through oncology for this MGUS.  Fortunately, a recent PET scan was unremarkable.  The patient apparently fell on 25 Oct 2018, she sustained a left orbital blowout fracture, she has not had any double vision following this.  She has felt somewhat foggy headed, she has difficulty concentrating since the event, she reports no significant headaches with exception of some discomfort in the left frontotemporal area.  The patient apparently had elevation in her liver enzymes and had to come off of Cymbalta and had to reduce her Crestor dosing.  The patient does have some nocturnal  leg cramps but this has improved with her reduced Crestor dose, she will occasionally take baclofen at night.  The patient is concerned about arthritis in her hands, she occasionally will have some ankle swelling as well but she is on amlodipine currently.  In the past, she has not tolerated Lyrica or gabapentin, the Cymbalta was helpful but she is not taking this now.  She has not had any further falls.  She is having some discomfort with the neuropathy throughout the day and in the evening.   REVIEW OF SYSTEMS: Out of a complete 14 system review of symptoms, the patient complains only of the following symptoms, and all other reviewed systems are negative.  Fatigue, ringing in ears, eye pain, restless leg, frequent waking, joint pain, joint swelling, aching muscles, neck pain, neck stiffness, memory loss, headache, depression, nervous/anxious  ALLERGIES: No Known Allergies  HOME MEDICATIONS: Outpatient Medications Prior to Visit  Medication Sig Dispense Refill  . ALPRAZolam (XANAX) 0.5 MG tablet Take 0.5 mg by mouth 2 (two) times daily as needed (restless legs).    Marland Kitchen aspirin 81 MG tablet Take 81 mg by mouth daily.    . calcium carbonate (OS-CAL - DOSED IN MG OF ELEMENTAL CALCIUM) 1250 MG tablet Take 1 tablet by mouth daily.      Marland Kitchen dexlansoprazole (DEXILANT) 60 MG capsule Take 60 mg by mouth daily.    Marland Kitchen escitalopram (LEXAPRO) 10 MG tablet Take 10 mg by mouth daily.    Marland Kitchen levothyroxine (  SYNTHROID, LEVOTHROID) 25 MCG tablet Take 25 mcg by mouth daily.      Marland Kitchen MAGNESIUM PO Take 1 tablet by mouth daily.    . metoprolol succinate (TOPROL-XL) 25 MG 24 hr tablet Take 25 mg by mouth daily.    . Multiple Vitamin (MULTIVITAMIN) tablet Take 1 tablet by mouth daily.      . rosuvastatin (CRESTOR) 5 MG tablet Take 5 mg by mouth once a week.    . triamterene-hydrochlorothiazide (MAXZIDE-25) 37.5-25 MG per tablet Take 1 tablet by mouth daily.      . vitamin C (ASCORBIC ACID) 500 MG tablet Take 500 mg by mouth  daily.      . Vitamin D, Ergocalciferol, (DRISDOL) 50000 UNITS CAPS Take 50,000 Units by mouth every 7 (seven) days. Take on Friday     . zolpidem (AMBIEN CR) 12.5 MG CR tablet Take 12.5 mg by mouth at bedtime.     No facility-administered medications prior to visit.    PAST MEDICAL HISTORY: Past Medical History:  Diagnosis Date  . Arthritis   . Complication of anesthesia    Elevated blood pressure after having last Kyphoplasty; pt stated "I was given Morphine and had to stay overnight"  . DI (detrusor instability)   . Fracture of vertebra    x 3  . GERD (gastroesophageal reflux disease)   . History of hiatal hernia   . Hypertension   . Hypothyroidism   . Menopausal symptoms   . MGUS (monoclonal gammopathy of unknown significance) 12/27/2017   IgA  . Osteoporosis   . Peripheral neuropathy 12/27/2017  . Restless leg syndrome   . Varicose veins     PAST SURGICAL HISTORY: Past Surgical History:  Procedure Laterality Date  . ABDOMINAL HYSTERECTOMY  1992   TAH,BSO  . BLADDER SUSPENSION  2011   CRYOMESH  . CARPAL TUNNEL RELEASE Bilateral 1982  . CHOLECYSTECTOMY  1992  . COLONOSCOPY    . EYE SURGERY Bilateral    Cataract removal  . KYPHOPLASTY     X 3  . LUMBAR LAMINECTOMY WITH COFLEX 1 LEVEL N/A 12/29/2015   Procedure: L3-4 Laminectomy with Coflex;  Surgeon: Kristeen Miss, MD;  Location: [facility];  Service: Neurosurgery;  Laterality: N/A;  L3-4 Laminectomy with coflex  . OOPHORECTOMY     BSO  . REPLACEMENT TOTAL KNEE  2008,  2011   RIGHT 2008, LEFT 2011  . SPINAL FUSION  2011  . VERTEBRAL SURGERY     T-8, T-10. T-12  DR Roselee Culver    FAMILY HISTORY: Family History  Problem Relation Age of Onset  . Heart disease Mother   . Heart disease Father   . Stroke Father   . Hypertension Sister   . Diabetes Sister   . Cancer Sister        OVARIAN CA  . Hypertension Brother   . Heart disease Brother   . Diabetes Sister   . Cancer Sister        Colon  . Heart disease  Brother   . Hypertension Brother     SOCIAL HISTORY: Social History   Socioeconomic History  . Marital status: Married    Spouse name: Not on file  . Number of children: 2  . Years of education: 24  . Highest education level: Not on file  Occupational History  . Occupation: Retired  Tobacco Use  . Smoking status: Never Smoker  . Smokeless tobacco: Never Used  Substance and Sexual Activity  . Alcohol use: No  .  Drug use: No  . Sexual activity: Yes    Birth control/protection: Surgical  Other Topics Concern  . Not on file  Social History Narrative   Lives  alone   Caffeine use: decaf only   Right handed    Social Determinants of Health   Financial Resource Strain:   . Difficulty of Paying Living Expenses: Not on file  Food Insecurity:   . Worried About Charity fundraiser in the Last Year: Not on file  . Ran Out of Food in the Last Year: Not on file  Transportation Needs:   . Lack of Transportation (Medical): Not on file  . Lack of Transportation (Non-Medical): Not on file  Physical Activity:   . Days of Exercise per Week: Not on file  . Minutes of Exercise per Session: Not on file  Stress:   . Feeling of Stress : Not on file  Social Connections:   . Frequency of Communication with Friends and Family: Not on file  . Frequency of Social Gatherings with Friends and Family: Not on file  . Attends Religious Services: Not on file  . Active Member of Clubs or Organizations: Not on file  . Attends Archivist Meetings: Not on file  . Marital Status: Not on file  Intimate Partner Violence:   . Fear of Current or Ex-Partner: Not on file  . Emotionally Abused: Not on file  . Physically Abused: Not on file  . Sexually Abused: Not on file   PHYSICAL EXAM  Vitals:   05/20/19 1435  BP: 102/63  Pulse: 81  Temp: (!) 97.4 F (36.3 C)  TempSrc: Oral  Weight: 139 lb 3.2 oz (63.1 kg)  Height: 5\' 1"  (1.549 m)   Body mass index is 26.3 kg/m.  Generalized:  Well developed, in no acute distress  MMSE - Mini Mental State Exam 05/20/2019  Not completed: (No Data)  Orientation to time 5  Orientation to Place 5  Registration 3  Attention/ Calculation 5  Recall 2  Language- name 2 objects 2  Language- repeat 0  Language- repeat-comments she said ifs ands or buts  Language- follow 3 step command 3  Language- read & follow direction 1  Write a sentence 1  Copy design 1  Total score 28    Neurological examination  Mentation: Alert oriented to time, place, history taking. Follows all commands speech and language fluent Cranial nerve II-XII: Pupils were equal round reactive to light. Extraocular movements were full, visual field were full on confrontational test. Facial sensation and strength were normal. Head turning and shoulder shrug  were normal and symmetric. Motor: The motor testing reveals 5 over 5 strength of all 4 extremities. Good symmetric motor tone is noted throughout.  Sensory: Sensory testing is intact to soft touch on all 4 extremities. No evidence of extinction is noted.  Coordination: Cerebellar testing reveals good finger-nose-finger and heel-to-shin bilaterally.  Gait and station: Gait is normal. Tandem gait is normal. Romberg is negative. No drift is seen.  Reflexes: Deep tendon reflexes are symmetric and normal bilaterally.   DIAGNOSTIC DATA (LABS, IMAGING, TESTING) - I reviewed patient records, labs, notes, testing and imaging myself where available.  Lab Results  Component Value Date   WBC 4.8 04/18/2019   HGB 13.4 04/18/2019   HCT 40.9 04/18/2019   MCV 95.6 04/18/2019   PLT 281 04/18/2019      Component Value Date/Time   NA 136 04/18/2019 0806   K 3.9 04/18/2019 0806  CL 98 04/18/2019 0806   CO2 32 04/18/2019 0806   GLUCOSE 86 04/18/2019 0806   BUN 26 (H) 04/18/2019 0806   CREATININE 1.06 (H) 04/18/2019 0806   CALCIUM 9.8 04/18/2019 0806   PROT 6.2 (L) 04/18/2019 0806   PROT 6.6 11/23/2017 0852   ALBUMIN  4.2 04/18/2019 0806   AST 21 04/18/2019 0806   ALT 17 04/18/2019 0806   ALKPHOS 64 04/18/2019 0806   BILITOT 0.6 04/18/2019 0806   GFRNONAA 49 (L) 04/18/2019 0806   GFRAA 56 (L) 04/18/2019 0806   No results found for: CHOL, HDL, LDLCALC, LDLDIRECT, TRIG, CHOLHDL No results found for: HGBA1C Lab Results  Component Value Date   VITAMINB12 2,378 (H) 08/13/2018   Lab Results  Component Value Date   TSH 1.746 11/29/2018   ASSESSMENT AND PLAN 85 y.o. year old female  has a past medical history of Arthritis, Complication of anesthesia, DI (detrusor instability), Fracture of vertebra, GERD (gastroesophageal reflux disease), History of hiatal hernia, Hypertension, Hypothyroidism, Menopausal symptoms, MGUS (monoclonal gammopathy of unknown significance) (12/27/2017), Osteoporosis, Peripheral neuropathy (12/27/2017), Restless leg syndrome, and Varicose veins. here with:  1.  Peripheral neuropathy 2.  MGUS, IgA monoclonal antibody 3.  Mild gait disorder 4.  Fall in May 2020, postconcussive syndrome, report of brain fog, cognitive symptoms  She will be sent for MRI of the brain without contrast due to report of brain fog, gait instability, and headache to the left temple since her fall in May.  CT of the head in July 2020 was unremarkable.  I will order routine lab work to rule out reversible causes of memory loss including TSH, sed rate, RPR.  B12 has been checked before, she remains on supplementation.  Her memory score is stable, 28/30.  Overall, she is quite well-appearing, is very sharp, and an excellent historian.  She will follow-up in 4 months or sooner if needed.  I did advise her symptoms worsen or she develops any new symptoms she should let us know.  She has been unable to tolerate Keppra, gabapentin, Lyrica, or Cymbalta.  I spent 25 minutes with the patient. 50% of this time was spent discussing her plan of care.  Butler Denmark, AGNP-C, DNP 05/20/2019, 2:43 PM [facility] [address], [place], [place] [address] [phone]

## 2019-05-21 ENCOUNTER — Telehealth: Payer: Self-pay

## 2019-05-21 LAB — TSH: TSH: 2.5 u[IU]/mL (ref 0.450–4.500)

## 2019-05-21 LAB — RPR: RPR Ser Ql: NONREACTIVE

## 2019-05-21 LAB — SEDIMENTATION RATE: Sed Rate: 2 mm/hr (ref 0–40)

## 2019-05-21 NOTE — Telephone Encounter (Signed)
Called pt on behalf of NP Butler Denmark for recent lab results: RPR, TSH, and Sedimentation rate.   No answer left VM.  Please relay results to pt if she calls back.   "Please call the patient. Her labs look normal."-NP SS

## 2019-06-13 DIAGNOSIS — Z124 Encounter for screening for malignant neoplasm of cervix: Secondary | ICD-10-CM | POA: Diagnosis not present

## 2019-06-15 ENCOUNTER — Other Ambulatory Visit: Payer: Self-pay

## 2019-06-15 ENCOUNTER — Ambulatory Visit
Admission: RE | Admit: 2019-06-15 | Discharge: 2019-06-15 | Disposition: A | Discharge status: Home / Self Care | Payer: Medicare Other | Source: Ambulatory Visit | Attending: Neurology | Admitting: Neurology

## 2019-06-15 DIAGNOSIS — R519 Headache, unspecified: Secondary | ICD-10-CM | POA: Diagnosis not present

## 2019-06-17 ENCOUNTER — Telehealth (HOSPITAL_COMMUNITY): Payer: Self-pay

## 2019-06-17 ENCOUNTER — Telehealth: Payer: Self-pay | Admitting: Neurology

## 2019-06-17 NOTE — Telephone Encounter (Signed)

## 2019-06-17 NOTE — Telephone Encounter (Signed)
MRI findings are chronic in nature, no change from December 2019.

## 2019-06-17 NOTE — Telephone Encounter (Signed)
MRI of the brain was ordered following a fall in May 2020, she has continued to complain of sensation of brain fog, pain behind her left eye, and pain to the left temple since the fall, occasional dizziness, gait instability. Laboratory evaluation has not shown any reversible causes for memory loss. Prior to MRI, she was sent for CT scan in July to rule out subdural hematoma. I will send the report to Dr. Jannifer Franklin for review, so far doesn't show any acute abnormality.  MRI of the brain 06/15/2019 IMPRESSION: This MRI of the brain without contrast shows the following: 1.   T2/flair hyperintense foci in the hemispheres consistent with chronic microvascular ischemic change, unchanged compared to the 05/29/2018 MRI. 2.   Mild generalized cortical atrophy. 3.   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 4.   There are no acute findings.

## 2019-06-18 ENCOUNTER — Encounter: Payer: Self-pay | Admitting: Vascular Surgery

## 2019-06-18 ENCOUNTER — Ambulatory Visit: Payer: Self-pay | Admitting: Neurology

## 2019-06-18 ENCOUNTER — Ambulatory Visit (INDEPENDENT_AMBULATORY_CARE_PROVIDER_SITE_OTHER): Payer: Medicare Other | Admitting: Vascular Surgery

## 2019-06-18 ENCOUNTER — Other Ambulatory Visit: Payer: Self-pay

## 2019-06-18 ENCOUNTER — Ambulatory Visit (HOSPITAL_COMMUNITY)
Admission: RE | Admit: 2019-06-18 | Discharge: 2019-06-18 | Disposition: A | Discharge status: Home / Self Care | Payer: Medicare Other | Source: Ambulatory Visit | Attending: Vascular Surgery | Admitting: Vascular Surgery

## 2019-06-18 VITALS — BP 124/73 | HR 67 | Temp 97.3°F | Resp 20 | Ht 61.0 in | Wt 139.6 lb

## 2019-06-18 DIAGNOSIS — M79605 Pain in left leg: Secondary | ICD-10-CM | POA: Insufficient documentation

## 2019-06-18 DIAGNOSIS — M79671 Pain in right foot: Secondary | ICD-10-CM | POA: Diagnosis not present

## 2019-06-18 DIAGNOSIS — M79672 Pain in left foot: Secondary | ICD-10-CM

## 2019-06-18 DIAGNOSIS — M79604 Pain in right leg: Secondary | ICD-10-CM | POA: Diagnosis not present

## 2019-06-18 NOTE — Progress Notes (Signed)
Vascular and Vein Specialist of [facility]  Patient name: [name] MRN: [id number] DOB: 1933-11-10 Sex: female  REASON FOR VISIT: Evaluation of burning and stinging in both feet.  HPI: [name] is a 86 y.o. female here today for evaluation.  I had seen her with similar complaints in May 2019.  She has a history of bilateral total knee replacements and also bring the small saphenous vein ablation at an outlying vein center.  She also has had a prior back surgery.  She reports persistent difficulty with burning and stinging sensation in both feet.  She does not have any significant swelling and she has no tissue loss.  Past Medical History:  Diagnosis Date  . Arthritis   . Complication of anesthesia    Elevated blood pressure after having last Kyphoplasty; pt stated "I was given Morphine and had to stay overnight"  . DI (detrusor instability)   . Fracture of vertebra    x 3  . GERD (gastroesophageal reflux disease)   . History of hiatal hernia   . Hypertension   . Hypothyroidism   . Menopausal symptoms   . MGUS (monoclonal gammopathy of unknown significance) 12/27/2017   IgA  . Osteoporosis   . Peripheral neuropathy 12/27/2017  . Restless leg syndrome   . Varicose veins     Family History  Problem Relation Age of Onset  . Heart disease Mother   . Heart disease Father   . Stroke Father   . Hypertension Sister   . Diabetes Sister   . Cancer Sister        OVARIAN CA  . Hypertension Brother   . Heart disease Brother   . Diabetes Sister   . Cancer Sister        Colon  . Heart disease Brother   . Hypertension Brother     SOCIAL HISTORY: Social History   Tobacco Use  . Smoking status: Never Smoker  . Smokeless tobacco: Never Used  Substance Use Topics  . Alcohol use: No    No Known Allergies  Current Outpatient Medications  Medication Sig Dispense Refill  . ALPRAZolam (XANAX) 0.5 MG tablet Take 0.5 mg by mouth 2  (two) times daily as needed (restless legs).    Marland Kitchen amoxicillin (AMOXIL) 500 MG capsule TAKE 4 CAPSULES BY MOUTH 1 HOUR PRIOR TO APPOINTMENT    . aspirin 81 MG tablet Take 81 mg by mouth daily.    . calcium carbonate (OS-CAL - DOSED IN MG OF ELEMENTAL CALCIUM) 1250 MG tablet Take 1 tablet by mouth daily.      Marland Kitchen dexlansoprazole (DEXILANT) 60 MG capsule Take 60 mg by mouth daily.    Marland Kitchen escitalopram (LEXAPRO) 10 MG tablet Take 10 mg by mouth daily.    Marland Kitchen levothyroxine (SYNTHROID, LEVOTHROID) 25 MCG tablet Take 25 mcg by mouth daily.      Marland Kitchen MAGNESIUM PO Take 1 tablet by mouth daily.    . metoprolol succinate (TOPROL-XL) 25 MG 24 hr tablet Take 25 mg by mouth daily.    . Multiple Vitamin (MULTIVITAMIN) tablet Take 1 tablet by mouth daily.      . rosuvastatin (CRESTOR) 5 MG tablet Take 5 mg by mouth once a week.    . triamterene-hydrochlorothiazide (MAXZIDE-25) 37.5-25 MG per tablet Take 1 tablet by mouth daily.      . vitamin C (ASCORBIC ACID) 500 MG tablet Take 500 mg by mouth daily.      . Vitamin D, Ergocalciferol, (DRISDOL)  50000 UNITS CAPS Take 50,000 Units by mouth every 7 (seven) days. Take on Friday     . zolpidem (AMBIEN CR) 12.5 MG CR tablet Take 12.5 mg by mouth at bedtime.     No current facility-administered medications for this visit.    REVIEW OF SYSTEMS:  [X]  denotes positive finding, [ ]  denotes negative finding Cardiac  Comments:  Chest pain or chest pressure:    Shortness of breath upon exertion:    Short of breath when lying flat:    Irregular heart rhythm:        Vascular    Pain in calf, thigh, or hip brought on by ambulation:    Pain in feet at night that wakes you up from your sleep:  x   Blood clot in your veins:    Leg swelling:           PHYSICAL EXAM: Vitals:   06/18/19 1425  BP: 124/73  Pulse: 67  Resp: 20  Temp: (!) 97.3 F (36.3 C)  SpO2: 97%  Weight: 139 lb 9.6 oz (63.3 kg)  Height: 5\' 1"  (1.549 m)    GENERAL: The patient is a well-nourished  female, in no acute distress. The vital signs are documented above. CARDIOVASCULAR: Palpable dorsalis pedis pulses bilaterally.  Telangiectasia in both lower extremities with no large varicosities.  No significant swelling PULMONARY: There is good air exchange  MUSCULOSKELETAL: There are no major deformities or cyanosis. NEUROLOGIC: No focal weakness or paresthesias are detected. SKIN: There are no ulcers or rashes noted. PSYCHIATRIC: The patient has a normal affect.  DATA:  Noninvasive venous studies today showed no evidence of DVT  MEDICAL ISSUES: No evidence of significant arterial or venous pathology.  I again reassured her with this.  She will see [place] again on an as-needed basis    Rosetta Posner, MD [facility] Vascular and Vein Specialists of [facility] Tel [phone] Pager [phone]

## 2019-06-19 ENCOUNTER — Telehealth: Payer: Self-pay | Admitting: *Deleted

## 2019-06-19 NOTE — Telephone Encounter (Signed)
-----   Message from Suzzanne Cloud, NP sent at 06/17/2019  4:44 PM EST ----- Please call the patient, MRI findings are chronic in nature, no change from December 2019.  MRI of the brain  IMPRESSION: This MRI of the brain without contrast shows the following: 1.   T2/flair hyperintense foci in the hemispheres consistent with chronic microvascular ischemic change, unchanged compared to the 05/29/2018 MRI. 2.   Mild generalized cortical atrophy. 3.   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 4.   There are no acute findings.

## 2019-06-19 NOTE — Telephone Encounter (Signed)
LMVm for pt ot return call for MRI results.

## 2019-06-20 ENCOUNTER — Encounter: Payer: Self-pay | Admitting: *Deleted

## 2019-06-20 NOTE — Telephone Encounter (Signed)
Pt returning call please call back °

## 2019-06-20 NOTE — Telephone Encounter (Signed)
I called pt and relayed that her MRI results were no change from last 04/2018.

## 2019-06-27 DIAGNOSIS — Z20818 Contact with and (suspected) exposure to other bacterial communicable diseases: Secondary | ICD-10-CM | POA: Diagnosis not present

## 2019-06-27 DIAGNOSIS — G44209 Tension-type headache, unspecified, not intractable: Secondary | ICD-10-CM | POA: Diagnosis not present

## 2019-07-09 DIAGNOSIS — Z8041 Family history of malignant neoplasm of ovary: Secondary | ICD-10-CM | POA: Diagnosis not present

## 2019-07-09 DIAGNOSIS — Z1231 Encounter for screening mammogram for malignant neoplasm of breast: Secondary | ICD-10-CM | POA: Diagnosis not present

## 2019-07-18 ENCOUNTER — Ambulatory Visit: Payer: Medicare Other | Admitting: Hematology & Oncology

## 2019-07-18 ENCOUNTER — Other Ambulatory Visit: Payer: Medicare Other

## 2019-08-02 DIAGNOSIS — Z471 Aftercare following joint replacement surgery: Secondary | ICD-10-CM | POA: Diagnosis not present

## 2019-08-02 DIAGNOSIS — Z96653 Presence of artificial knee joint, bilateral: Secondary | ICD-10-CM | POA: Diagnosis not present

## 2019-08-07 ENCOUNTER — Inpatient Hospital Stay (HOSPITAL_BASED_OUTPATIENT_CLINIC_OR_DEPARTMENT_OTHER): Payer: Medicare Other | Admitting: Hematology & Oncology

## 2019-08-07 ENCOUNTER — Encounter: Payer: Self-pay | Admitting: Hematology & Oncology

## 2019-08-07 ENCOUNTER — Other Ambulatory Visit: Payer: Self-pay

## 2019-08-07 ENCOUNTER — Telehealth: Payer: Self-pay | Admitting: Hematology & Oncology

## 2019-08-07 ENCOUNTER — Inpatient Hospital Stay: Payer: Medicare Other | Attending: Hematology & Oncology

## 2019-08-07 VITALS — BP 128/67 | HR 85 | Temp 97.1°F | Resp 20 | Wt 141.1 lb

## 2019-08-07 DIAGNOSIS — Z79899 Other long term (current) drug therapy: Secondary | ICD-10-CM | POA: Diagnosis not present

## 2019-08-07 DIAGNOSIS — E7800 Pure hypercholesterolemia, unspecified: Secondary | ICD-10-CM | POA: Diagnosis not present

## 2019-08-07 DIAGNOSIS — R519 Headache, unspecified: Secondary | ICD-10-CM | POA: Insufficient documentation

## 2019-08-07 DIAGNOSIS — D472 Monoclonal gammopathy: Secondary | ICD-10-CM | POA: Diagnosis not present

## 2019-08-07 LAB — LIPID PANEL
Cholesterol: 264 mg/dL — ABNORMAL HIGH (ref 0–200)
HDL: 77 mg/dL (ref >40)
LDL Cholesterol: 158 mg/dL — ABNORMAL HIGH (ref 0–99)
Total CHOL/HDL Ratio: 3.4 RATIO
Triglycerides: 144 mg/dL (ref <150)
VLDL: 29 mg/dL (ref 0–40)

## 2019-08-07 LAB — CMP (CANCER CENTER ONLY)
ALT: 18 U/L (ref 0–44)
AST: 21 U/L (ref 15–41)
Albumin: 4.6 g/dL (ref 3.5–5.0)
Alkaline Phosphatase: 72 U/L (ref 38–126)
Anion gap: 8 (ref 5–15)
BUN: 23 mg/dL (ref 8–23)
CO2: 31 mmol/L (ref 22–32)
Calcium: 10.3 mg/dL (ref 8.9–10.3)
Chloride: 97 mmol/L — ABNORMAL LOW (ref 98–111)
Creatinine: 0.91 mg/dL (ref 0.44–1.00)
GFR, Est AFR Am: >60 mL/min (ref >60)
GFR, Estimated: 58 mL/min — ABNORMAL LOW (ref >60)
Glucose, Bld: 97 mg/dL (ref 70–99)
Potassium: 3.9 mmol/L (ref 3.5–5.1)
Sodium: 136 mmol/L (ref 135–145)
Total Bilirubin: 0.6 mg/dL (ref 0.3–1.2)
Total Protein: 7.1 g/dL (ref 6.5–8.1)

## 2019-08-07 LAB — CBC WITH DIFFERENTIAL (CANCER CENTER ONLY)
Abs Immature Granulocytes: 0.01 10*3/uL (ref 0.00–0.07)
Basophils Absolute: 0 10*3/uL (ref 0.0–0.1)
Basophils Relative: 0 %
Eosinophils Absolute: 0.1 10*3/uL (ref 0.0–0.5)
Eosinophils Relative: 2 %
HCT: 40.9 % (ref 36.0–46.0)
Hemoglobin: 13.5 g/dL (ref 12.0–15.0)
Immature Granulocytes: 0 %
Lymphocytes Relative: 38 %
Lymphs Abs: 1.9 10*3/uL (ref 0.7–4.0)
MCH: 31.5 pg (ref 26.0–34.0)
MCHC: 33 g/dL (ref 30.0–36.0)
MCV: 95.3 fL (ref 80.0–100.0)
Monocytes Absolute: 0.6 10*3/uL (ref 0.1–1.0)
Monocytes Relative: 12 %
Neutro Abs: 2.3 10*3/uL (ref 1.7–7.7)
Neutrophils Relative %: 48 %
Platelet Count: 324 10*3/uL (ref 150–400)
RBC: 4.29 MIL/uL (ref 3.87–5.11)
RDW: 12.7 % (ref 11.5–15.5)
WBC Count: 4.8 10*3/uL (ref 4.0–10.5)
nRBC: 0 % (ref 0.0–0.2)

## 2019-08-07 NOTE — Progress Notes (Signed)
Hematology and Oncology Follow Up Visit  [name] [id number] Mar 11, 1934 86 y.o. 08/07/2019   Principle Diagnosis:   IgA Kappa MGUS  Current Therapy:    Observation     Interim History:  [name] is back for follow-up.  [name] is doing pretty well.  [name] has had her coronavirus vaccines.  [name] is still being very cautious however with being out in public and being around people.  I told her if [name] wanted to go to church, [name] could but I would still wear a mask.  Thankfully, her last monoclonal spike back in November was stable at 0.2 g/dL.  Her IgA level was 370 mg/dL which is also stable.  Her kappa light chain was 3.3 mg/dL.  [name] has had no fever.  [name] has had no cough.  There is no change in bowel or bladder habits.  [name] has had no weight loss or weight gain.  [name] had a wonderful holiday season with her family.  [name] has had no bleeding.  [name] has had no rashes.  Overall, her performance status is ECOG 1.  Medications:  Current Outpatient Medications:  .  ALPRAZolam (XANAX) 0.5 MG tablet, Take 0.5 mg by mouth 2 (two) times daily as needed (restless legs)., Disp: , Rfl:  .  amoxicillin (AMOXIL) 500 MG capsule, TAKE 4 CAPSULES BY MOUTH 1 HOUR PRIOR TO APPOINTMENT, Disp: , Rfl:  .  aspirin 81 MG tablet, Take 81 mg by mouth daily., Disp: , Rfl:  .  calcium carbonate (OS-CAL - DOSED IN MG OF ELEMENTAL CALCIUM) 1250 MG tablet, Take 1 tablet by mouth daily.  , Disp: , Rfl:  .  celecoxib (CELEBREX) 200 MG capsule, Take 200 mg by mouth daily., Disp: , Rfl:  .  dexlansoprazole (DEXILANT) 60 MG capsule, Take 60 mg by mouth daily., Disp: , Rfl:  .  escitalopram (LEXAPRO) 10 MG tablet, Take 10 mg by mouth daily., Disp: , Rfl:  .  levothyroxine (SYNTHROID, LEVOTHROID) 25 MCG tablet, Take 25 mcg by mouth daily.  , Disp: , Rfl:  .  MAGNESIUM PO, Take 1 tablet by mouth daily., Disp: , Rfl:  .  metoprolol succinate (TOPROL-XL) 25 MG 24 hr tablet, Take 25 mg by mouth daily., Disp: , Rfl:  .   Multiple Vitamin (MULTIVITAMIN) tablet, Take 1 tablet by mouth daily.  , Disp: , Rfl:  .  rosuvastatin (CRESTOR) 5 MG tablet, Take 5 mg by mouth once a week., Disp: , Rfl:  .  triamterene-hydrochlorothiazide (MAXZIDE-25) 37.5-25 MG per tablet, Take 1 tablet by mouth daily.  , Disp: , Rfl:  .  vitamin C (ASCORBIC ACID) 500 MG tablet, Take 500 mg by mouth daily.  , Disp: , Rfl:  .  Vitamin D, Ergocalciferol, (DRISDOL) 50000 UNITS CAPS, Take 50,000 Units by mouth every 7 (seven) days. Take on Friday , Disp: , Rfl:  .  zolpidem (AMBIEN CR) 12.5 MG CR tablet, Take 12.5 mg by mouth at bedtime., Disp: , Rfl:   Allergies: No Known Allergies  Past Medical History, Surgical history, Social history, and Family History were reviewed and updated.  Review of Systems: Review of Systems  Constitutional: Negative.  Negative for appetite change, fatigue, fever and unexpected weight change.  HENT:  Negative.  Negative for lump/mass, mouth sores, sore throat and trouble swallowing.   Eyes: Negative.   Respiratory: Negative.  Negative for cough, hemoptysis and shortness of breath.   Cardiovascular: Negative.  Negative for leg swelling and palpitations.  Gastrointestinal:  Negative.  Negative for abdominal distention, abdominal pain, blood in stool, constipation, diarrhea, nausea and vomiting.  Endocrine: Negative.   Genitourinary: Negative.  Negative for bladder incontinence, dysuria, frequency and hematuria.   Musculoskeletal: Positive for myalgias. Negative for arthralgias, back pain and gait problem.  Skin: Negative.  Negative for itching and rash.  Neurological: Positive for headaches. Negative for dizziness, extremity weakness, gait problem, numbness, seizures and speech difficulty.  Hematological: Negative.  Does not bruise/bleed easily.  Psychiatric/Behavioral: Negative.  Negative for depression and sleep disturbance. The patient is not nervous/anxious.     Physical Exam:  weight is 141 lb 1.9 oz (64  kg). Her temporal temperature is 97.1 F (36.2 C) (abnormal). Her blood pressure is 128/67 and her pulse is 85. Her respiration is 20 and oxygen saturation is 98%.   Wt Readings from Last 3 Encounters:  08/07/19 141 lb 1.9 oz (64 kg)  06/18/19 139 lb 9.6 oz (63.3 kg)  05/20/19 139 lb 3.2 oz (63.1 kg)    Physical Exam Vitals reviewed.  HENT:     Head: Normocephalic and atraumatic.  Eyes:     Pupils: Pupils are equal, round, and reactive to light.  Cardiovascular:     Rate and Rhythm: Normal rate and regular rhythm.     Heart sounds: Normal heart sounds.  Pulmonary:     Effort: Pulmonary effort is normal.     Breath sounds: Normal breath sounds.  Abdominal:     General: Bowel sounds are normal.     Palpations: Abdomen is soft.  Musculoskeletal:        General: No tenderness or deformity. Normal range of motion.     Cervical back: Normal range of motion.  Lymphadenopathy:     Cervical: No cervical adenopathy.  Skin:    General: Skin is warm and dry.     Findings: No erythema or rash.  Neurological:     Mental Status: [name] is alert and oriented to person, place, and time.  Psychiatric:        Behavior: Behavior normal.        Thought Content: Thought content normal.        Judgment: Judgment normal.      Lab Results  Component Value Date   WBC 4.8 08/07/2019   HGB 13.5 08/07/2019   HCT 40.9 08/07/2019   MCV 95.3 08/07/2019   PLT 324 08/07/2019     Chemistry      Component Value Date/Time   NA 136 08/07/2019 1112   K 3.9 08/07/2019 1112   CL 97 (L) 08/07/2019 1112   CO2 31 08/07/2019 1112   BUN 23 08/07/2019 1112   CREATININE 0.91 08/07/2019 1112      Component Value Date/Time   CALCIUM 10.3 08/07/2019 1112   ALKPHOS 72 08/07/2019 1112   AST 21 08/07/2019 1112   ALT 18 08/07/2019 1112   BILITOT 0.6 08/07/2019 1112         Impression and Plan: [name] is a 86 year old white female.  [name] has an IgA kappa MGUS.  This is still at a very low level.   This certainly could be from her age alone.  I really think we can get her back now in 4 months.  Her MGUS is holding pretty steady.  I just would not think that this would not be causing her to have problems.    [name] is not anemic.  Her renal function looks okay.  Her calcium looks fine.  [name], MD 3/10/202111:53 AM

## 2019-08-07 NOTE — Telephone Encounter (Signed)
Appointments scheduled calendar printed per 3/10 los 

## 2019-08-08 LAB — IGG, IGA, IGM
IgA: 354 mg/dL (ref 64–422)
IgG (Immunoglobin G), Serum: 706 mg/dL (ref 586–1602)
IgM (Immunoglobulin M), Srm: 65 mg/dL (ref 26–217)

## 2019-08-08 LAB — KAPPA/LAMBDA LIGHT CHAINS
Kappa free light chain: 29.5 mg/L — ABNORMAL HIGH (ref 3.3–19.4)
Kappa, lambda light chain ratio: 1.27 (ref 0.26–1.65)
Lambda free light chains: 23.2 mg/L (ref 5.7–26.3)

## 2019-08-09 ENCOUNTER — Telehealth: Payer: Self-pay | Admitting: *Deleted

## 2019-08-09 NOTE — Telephone Encounter (Signed)
Called pt, unable to reach. Lmovm with MD recommendations. Copy of labs routed to (PCP)  Dr. Reynaldo Minium

## 2019-08-09 NOTE — Telephone Encounter (Signed)
-----   Message from Yvonne Napoleon, MD sent at 08/08/2019  5:18 PM EST ----- Call - the cholesterol is quite high!!  Your good cholesterol is high!!  Need to make sure her family MD gets this.  [name]

## 2019-08-12 LAB — PROTEIN ELECTROPHORESIS, SERUM, WITH REFLEX
A/G Ratio: 1.5 (ref 0.7–1.7)
Albumin ELP: 4.1 g/dL (ref 2.9–4.4)
Alpha-1-Globulin: 0.2 g/dL (ref 0.0–0.4)
Alpha-2-Globulin: 0.7 g/dL (ref 0.4–1.0)
Beta Globulin: 1.1 g/dL (ref 0.7–1.3)
Gamma Globulin: 0.9 g/dL (ref 0.4–1.8)
Globulin, Total: 2.8 g/dL (ref 2.2–3.9)
M-Spike, %: 0.3 g/dL — ABNORMAL HIGH
SPEP Interpretation: 0
Total Protein ELP: 6.9 g/dL (ref 6.0–8.5)

## 2019-08-12 LAB — IMMUNOFIXATION REFLEX, SERUM
IgA: 415 mg/dL (ref 64–422)
IgG (Immunoglobin G), Serum: 902 mg/dL (ref 586–1602)
IgM (Immunoglobulin M), Srm: 79 mg/dL (ref 26–217)

## 2019-08-13 ENCOUNTER — Encounter: Payer: Self-pay | Admitting: *Deleted

## 2019-08-19 ENCOUNTER — Telehealth: Payer: Self-pay | Admitting: *Deleted

## 2019-08-19 NOTE — Telephone Encounter (Signed)
Message received from patient requesting call back regarding her lab results from 08/07/19.  Call placed back to patient and lab results reviewed with patient. Pt states that she will contact Dr. Reynaldo Minium regarding her cholesterol levels.  Pt appreciative of call back and has no other questions or concerns at this time.

## 2019-08-21 DIAGNOSIS — M25511 Pain in right shoulder: Secondary | ICD-10-CM | POA: Diagnosis not present

## 2019-08-21 DIAGNOSIS — M25512 Pain in left shoulder: Secondary | ICD-10-CM | POA: Diagnosis not present

## 2019-08-24 DIAGNOSIS — S62307A Unspecified fracture of fifth metacarpal bone, left hand, initial encounter for closed fracture: Secondary | ICD-10-CM | POA: Diagnosis not present

## 2019-08-26 DIAGNOSIS — M79642 Pain in left hand: Secondary | ICD-10-CM | POA: Diagnosis not present

## 2019-08-26 DIAGNOSIS — S62307A Unspecified fracture of fifth metacarpal bone, left hand, initial encounter for closed fracture: Secondary | ICD-10-CM | POA: Diagnosis not present

## 2019-09-09 DIAGNOSIS — S62307A Unspecified fracture of fifth metacarpal bone, left hand, initial encounter for closed fracture: Secondary | ICD-10-CM | POA: Diagnosis not present

## 2019-09-11 DIAGNOSIS — J209 Acute bronchitis, unspecified: Secondary | ICD-10-CM | POA: Diagnosis not present

## 2019-09-11 DIAGNOSIS — Z1152 Encounter for screening for COVID-19: Secondary | ICD-10-CM | POA: Diagnosis not present

## 2019-09-11 DIAGNOSIS — R05 Cough: Secondary | ICD-10-CM | POA: Diagnosis not present

## 2019-09-24 ENCOUNTER — Encounter: Payer: Self-pay | Admitting: Neurology

## 2019-09-24 ENCOUNTER — Ambulatory Visit (INDEPENDENT_AMBULATORY_CARE_PROVIDER_SITE_OTHER): Payer: Medicare Other | Admitting: Neurology

## 2019-09-24 ENCOUNTER — Other Ambulatory Visit: Payer: Self-pay | Admitting: Neurology

## 2019-09-24 ENCOUNTER — Other Ambulatory Visit: Payer: Self-pay

## 2019-09-24 VITALS — BP 148/83 | HR 102 | Ht 61.0 in | Wt 139.0 lb

## 2019-09-24 DIAGNOSIS — D472 Monoclonal gammopathy: Secondary | ICD-10-CM

## 2019-09-24 DIAGNOSIS — G603 Idiopathic progressive neuropathy: Secondary | ICD-10-CM | POA: Diagnosis not present

## 2019-09-24 DIAGNOSIS — M79642 Pain in left hand: Secondary | ICD-10-CM | POA: Diagnosis not present

## 2019-09-24 DIAGNOSIS — G8929 Other chronic pain: Secondary | ICD-10-CM

## 2019-09-24 DIAGNOSIS — M545 Low back pain, unspecified: Secondary | ICD-10-CM

## 2019-09-24 DIAGNOSIS — R269 Unspecified abnormalities of gait and mobility: Secondary | ICD-10-CM | POA: Diagnosis not present

## 2019-09-24 MED ORDER — VENLAFAXINE HCL ER 37.5 MG PO CP24: 37.5000 mg | ORAL_CAPSULE | Freq: Every day | ORAL | 2 refills | Status: DC

## 2019-09-24 NOTE — Progress Notes (Signed)
Reason for visit: Peripheral neuropathy, gait disturbance  [name] is an 86 y.o. female  History of present illness:  [name] is an 86 year old right-handed white female with a history of an IgA MGUS.  The patient has a peripheral neuropathy and associated gait disorder.  She has had a recent fall about a month ago and fractured her left hand.  She also has had a fall in the past resulting in a concussion.  She still has some mild left-sided headaches that occur about once a week, she treats this with Tylenol.  In the past she has not been able to tolerate multiple medications for her neuropathy including gabapentin, Lyrica, Cymbalta, and Keppra.  The patient has been evaluated for some memory problems but she denies any significant issues in this regard today.  MRI of the brain showed some mild white matter changes.  The patient has never undergone physical therapy for her balance.  She does not use a cane or a Hird.  The patient returns for further evaluation.  Past Medical History:  Diagnosis Date  . Arthritis   . Complication of anesthesia    Elevated blood pressure after having last Kyphoplasty; pt stated "I was given Morphine and had to stay overnight"  . DI (detrusor instability)   . Fracture of vertebra    x 3  . GERD (gastroesophageal reflux disease)   . History of hiatal hernia   . Hypertension   . Hypothyroidism   . Menopausal symptoms   . MGUS (monoclonal gammopathy of unknown significance) 12/27/2017   IgA  . Osteoporosis   . Peripheral neuropathy 12/27/2017  . Restless leg syndrome   . Varicose veins     Past Surgical History:  Procedure Laterality Date  . ABDOMINAL HYSTERECTOMY  1992   TAH,BSO  . BLADDER SUSPENSION  2011   CRYOMESH  . CARPAL TUNNEL RELEASE Bilateral 1982  . CHOLECYSTECTOMY  1992  . COLONOSCOPY    . EYE SURGERY Bilateral    Cataract removal  . KYPHOPLASTY     X 3  . LUMBAR LAMINECTOMY WITH COFLEX 1 LEVEL N/A 12/29/2015   Procedure: L3-4 Laminectomy with Coflex;  Surgeon: Kristeen Miss, MD;  Location: [facility];  Service: Neurosurgery;  Laterality: N/A;  L3-4 Laminectomy with coflex  . OOPHORECTOMY     BSO  . REPLACEMENT TOTAL KNEE  2008,  2011   RIGHT 2008, LEFT 2011  . SPINAL FUSION  2011  . VERTEBRAL SURGERY     T-8, T-10. T-12  DR Roselee Culver    Family History  Problem Relation Age of Onset  . Heart disease Mother   . Heart disease Father   . Stroke Father   . Hypertension Sister   . Diabetes Sister   . Cancer Sister        OVARIAN CA  . Hypertension Brother   . Heart disease Brother   . Diabetes Sister   . Cancer Sister        Colon  . Heart disease Brother   . Hypertension Brother     Social history:  reports that she has never smoked. She has never used smokeless tobacco. She reports that she does not drink alcohol or use drugs.   No Known Allergies  Medications:  Prior to Admission medications   Medication Sig Start Date End Date Taking? Authorizing Provider  ALPRAZolam Duanne Moron) 0.5 MG tablet Take 0.5 mg by mouth 2 (two) times daily as needed (restless legs).  Yes [provider]  amoxicillin (AMOXIL) 500 MG capsule TAKE 4 CAPSULES BY MOUTH 1 HOUR PRIOR TO APPOINTMENT 06/11/19  Yes [provider]  aspirin 81 MG tablet Take 81 mg by mouth daily.   Yes [provider]  calcium carbonate (OS-CAL - DOSED IN MG OF ELEMENTAL CALCIUM) 1250 MG tablet Take 1 tablet by mouth daily.     Yes [provider]  celecoxib (CELEBREX) 200 MG capsule Take 200 mg by mouth daily. 08/02/19  Yes [provider]  dexlansoprazole (DEXILANT) 60 MG capsule Take 60 mg by mouth daily.   Yes [provider]  escitalopram (LEXAPRO) 10 MG tablet Take 10 mg by mouth daily.   Yes [provider]  levothyroxine (SYNTHROID, LEVOTHROID) 25 MCG tablet Take 25 mcg by mouth daily.     Yes [provider]  MAGNESIUM PO Take 1 tablet by mouth daily.   Yes  [provider]  metoprolol succinate (TOPROL-XL) 25 MG 24 hr tablet Take 25 mg by mouth daily.   Yes [provider]  Multiple Vitamin (MULTIVITAMIN) tablet Take 1 tablet by mouth daily.     Yes [provider]  rosuvastatin (CRESTOR) 5 MG tablet Take 5 mg by mouth once a week.   Yes [provider]  triamterene-hydrochlorothiazide (MAXZIDE-25) 37.5-25 MG per tablet Take 1 tablet by mouth daily.     Yes [provider]  vitamin C (ASCORBIC ACID) 500 MG tablet Take 500 mg by mouth daily.     Yes [provider]  Vitamin D, Ergocalciferol, (DRISDOL) 50000 UNITS CAPS Take 50,000 Units by mouth every 7 (seven) days. Take on Friday    Yes [provider]  zolpidem (AMBIEN CR) 12.5 MG CR tablet Take 12.5 mg by mouth at bedtime.   Yes [provider]    ROS:  Out of a complete 14 system review of symptoms, the patient complains only of the following symptoms, and all other reviewed systems are negative.  Burning sensations in the feet Balance problems Low back pain  Blood pressure (!) 148/83, pulse (!) 102, height 5\' 1"  (1.549 m), weight 139 lb (63 kg).  Physical Exam  General: The patient is alert and cooperative at the time of the examination.  Skin: No significant peripheral edema is noted.   Neurologic Exam  Mental status: The patient is alert and oriented x 3 at the time of the examination. The patient has apparent normal recent and remote memory, with an apparently normal attention span and concentration ability.   Cranial nerves: Facial symmetry is present. Speech is normal, no aphasia or dysarthria is noted. Extraocular movements are full. Visual fields are full.  Motor: The patient has good strength in all 4 extremities, with exception of bilateral foot drops.  Sensory examination: Soft touch sensation is symmetric on the face, arms, and legs.  Coordination: The patient has good finger-nose-finger and  heel-to-shin bilaterally.  Gait and station: The patient has a normal gait. Tandem gait is slightly unsteady. Romberg is negative. No drift is seen.  Reflexes: Deep tendon reflexes are symmetric, but are depressed.   MRI brain 06/15/19:  IMPRESSION: This MRI of the brain without contrast shows the following: 1.   T2/flair hyperintense foci in the hemispheres consistent with chronic microvascular ischemic change, unchanged compared to the 05/29/2018 MRI. 2.   Mild generalized cortical atrophy. 3.   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  4.   There are no acute findings.  * MRI scan images were reviewed online. I agree with the written report.    Assessment/Plan:  1.  Peripheral neuropathy  2. MGUS, IgA  3.  Chronic low back pain  4.  Mild memory disturbance  The patient has been followed in the past by Dr. Ellene Route for her chronic low back pain, she is having increasing problems recently.  She indicates in the past she has gotten epidural steroid injections with some benefit, I will get this set up.  The patient will be set up for physical therapy for balance training.  She will be placed on low-dose Effexor taking 37.5 mg daily.  She will follow-up here in 6 months.   Jill Alexanders MD 09/24/2019 7:41 AM  [facility] [address] [place], [place] [address]  Phone [phone] Fax [phone]

## 2019-09-30 DIAGNOSIS — S62307D Unspecified fracture of fifth metacarpal bone, left hand, subsequent encounter for fracture with routine healing: Secondary | ICD-10-CM | POA: Diagnosis not present

## 2019-09-30 DIAGNOSIS — M79642 Pain in left hand: Secondary | ICD-10-CM | POA: Diagnosis not present

## 2019-10-02 DIAGNOSIS — M5416 Radiculopathy, lumbar region: Secondary | ICD-10-CM | POA: Diagnosis not present

## 2019-10-02 DIAGNOSIS — M79642 Pain in left hand: Secondary | ICD-10-CM | POA: Diagnosis not present

## 2019-10-08 DIAGNOSIS — M5116 Intervertebral disc disorders with radiculopathy, lumbar region: Secondary | ICD-10-CM | POA: Diagnosis not present

## 2019-10-08 DIAGNOSIS — M5416 Radiculopathy, lumbar region: Secondary | ICD-10-CM | POA: Diagnosis not present

## 2019-10-14 DIAGNOSIS — R971 Elevated cancer antigen 125 [CA 125]: Secondary | ICD-10-CM | POA: Diagnosis not present

## 2019-10-16 ENCOUNTER — Other Ambulatory Visit: Payer: Self-pay | Admitting: Neurology

## 2019-10-17 ENCOUNTER — Other Ambulatory Visit: Payer: Self-pay | Admitting: Family Medicine

## 2019-10-21 DIAGNOSIS — Z9079 Acquired absence of other genital organ(s): Secondary | ICD-10-CM | POA: Diagnosis not present

## 2019-10-21 DIAGNOSIS — Z90722 Acquired absence of ovaries, bilateral: Secondary | ICD-10-CM | POA: Diagnosis not present

## 2019-10-21 DIAGNOSIS — R971 Elevated cancer antigen 125 [CA 125]: Secondary | ICD-10-CM | POA: Diagnosis not present

## 2019-10-21 DIAGNOSIS — Z8041 Family history of malignant neoplasm of ovary: Secondary | ICD-10-CM | POA: Diagnosis not present

## 2019-10-21 DIAGNOSIS — Z79899 Other long term (current) drug therapy: Secondary | ICD-10-CM | POA: Diagnosis not present

## 2019-10-24 DIAGNOSIS — S62307D Unspecified fracture of fifth metacarpal bone, left hand, subsequent encounter for fracture with routine healing: Secondary | ICD-10-CM | POA: Diagnosis not present

## 2019-10-25 DIAGNOSIS — R918 Other nonspecific abnormal finding of lung field: Secondary | ICD-10-CM | POA: Diagnosis not present

## 2019-10-25 DIAGNOSIS — R971 Elevated cancer antigen 125 [CA 125]: Secondary | ICD-10-CM | POA: Diagnosis not present

## 2019-10-25 DIAGNOSIS — R1909 Other intra-abdominal and pelvic swelling, mass and lump: Secondary | ICD-10-CM | POA: Diagnosis not present

## 2019-10-25 DIAGNOSIS — I700 Atherosclerosis of aorta: Secondary | ICD-10-CM | POA: Diagnosis not present

## 2019-10-25 DIAGNOSIS — K449 Diaphragmatic hernia without obstruction or gangrene: Secondary | ICD-10-CM | POA: Diagnosis not present

## 2019-10-25 DIAGNOSIS — Z8041 Family history of malignant neoplasm of ovary: Secondary | ICD-10-CM | POA: Diagnosis not present

## 2019-10-29 DIAGNOSIS — M79676 Pain in unspecified toe(s): Secondary | ICD-10-CM | POA: Diagnosis not present

## 2019-10-29 DIAGNOSIS — B351 Tinea unguium: Secondary | ICD-10-CM | POA: Diagnosis not present

## 2019-10-29 DIAGNOSIS — L84 Corns and callosities: Secondary | ICD-10-CM | POA: Diagnosis not present

## 2019-11-13 DIAGNOSIS — H524 Presbyopia: Secondary | ICD-10-CM | POA: Diagnosis not present

## 2019-11-13 DIAGNOSIS — Z961 Presence of intraocular lens: Secondary | ICD-10-CM | POA: Diagnosis not present

## 2019-12-04 ENCOUNTER — Other Ambulatory Visit: Payer: Self-pay | Admitting: Gastroenterology

## 2019-12-04 ENCOUNTER — Ambulatory Visit
Admission: RE | Admit: 2019-12-04 | Discharge: 2019-12-04 | Disposition: A | Discharge status: Home / Self Care | Payer: Medicare Other | Source: Ambulatory Visit | Attending: Gastroenterology | Admitting: Gastroenterology

## 2019-12-04 DIAGNOSIS — R159 Full incontinence of feces: Secondary | ICD-10-CM

## 2019-12-04 DIAGNOSIS — M75121 Complete rotator cuff tear or rupture of right shoulder, not specified as traumatic: Secondary | ICD-10-CM | POA: Diagnosis not present

## 2019-12-04 DIAGNOSIS — M25512 Pain in left shoulder: Secondary | ICD-10-CM | POA: Diagnosis not present

## 2019-12-04 DIAGNOSIS — M25511 Pain in right shoulder: Secondary | ICD-10-CM | POA: Diagnosis not present

## 2019-12-04 DIAGNOSIS — S43431A Superior glenoid labrum lesion of right shoulder, initial encounter: Secondary | ICD-10-CM | POA: Diagnosis not present

## 2019-12-04 DIAGNOSIS — M67813 Other specified disorders of tendon, right shoulder: Secondary | ICD-10-CM | POA: Diagnosis not present

## 2019-12-06 ENCOUNTER — Inpatient Hospital Stay (HOSPITAL_BASED_OUTPATIENT_CLINIC_OR_DEPARTMENT_OTHER): Payer: Medicare Other | Admitting: Family

## 2019-12-06 ENCOUNTER — Other Ambulatory Visit: Payer: Self-pay

## 2019-12-06 ENCOUNTER — Inpatient Hospital Stay: Payer: Medicare Other | Attending: Hematology & Oncology

## 2019-12-06 ENCOUNTER — Telehealth: Payer: Self-pay | Admitting: Family

## 2019-12-06 ENCOUNTER — Telehealth: Payer: Self-pay | Admitting: Interventional Cardiology

## 2019-12-06 VITALS — BP 155/94 | HR 98 | Temp 98.9°F | Resp 18 | Wt 139.0 lb

## 2019-12-06 DIAGNOSIS — R971 Elevated cancer antigen 125 [CA 125]: Secondary | ICD-10-CM | POA: Diagnosis not present

## 2019-12-06 DIAGNOSIS — Z8041 Family history of malignant neoplasm of ovary: Secondary | ICD-10-CM | POA: Insufficient documentation

## 2019-12-06 DIAGNOSIS — D472 Monoclonal gammopathy: Secondary | ICD-10-CM | POA: Insufficient documentation

## 2019-12-06 DIAGNOSIS — R0602 Shortness of breath: Secondary | ICD-10-CM | POA: Diagnosis not present

## 2019-12-06 DIAGNOSIS — R519 Headache, unspecified: Secondary | ICD-10-CM | POA: Diagnosis not present

## 2019-12-06 DIAGNOSIS — Z79899 Other long term (current) drug therapy: Secondary | ICD-10-CM | POA: Diagnosis not present

## 2019-12-06 DIAGNOSIS — E7800 Pure hypercholesterolemia, unspecified: Secondary | ICD-10-CM

## 2019-12-06 DIAGNOSIS — R002 Palpitations: Secondary | ICD-10-CM | POA: Insufficient documentation

## 2019-12-06 LAB — CBC WITH DIFFERENTIAL (CANCER CENTER ONLY)
Abs Immature Granulocytes: 0.01 10*3/uL (ref 0.00–0.07)
Basophils Absolute: 0 10*3/uL (ref 0.0–0.1)
Basophils Relative: 0 %
Eosinophils Absolute: 0.2 10*3/uL (ref 0.0–0.5)
Eosinophils Relative: 5 %
HCT: 42.5 % (ref 36.0–46.0)
Hemoglobin: 14 g/dL (ref 12.0–15.0)
Immature Granulocytes: 0 %
Lymphocytes Relative: 31 %
Lymphs Abs: 1.6 10*3/uL (ref 0.7–4.0)
MCH: 32.3 pg (ref 26.0–34.0)
MCHC: 32.9 g/dL (ref 30.0–36.0)
MCV: 98.2 fL (ref 80.0–100.0)
Monocytes Absolute: 0.7 10*3/uL (ref 0.1–1.0)
Monocytes Relative: 13 %
Neutro Abs: 2.6 10*3/uL (ref 1.7–7.7)
Neutrophils Relative %: 51 %
Platelet Count: 319 10*3/uL (ref 150–400)
RBC: 4.33 MIL/uL (ref 3.87–5.11)
RDW: 13.5 % (ref 11.5–15.5)
WBC Count: 5.1 10*3/uL (ref 4.0–10.5)
nRBC: 0 % (ref 0.0–0.2)

## 2019-12-06 LAB — CMP (CANCER CENTER ONLY)
ALT: 28 U/L (ref 0–44)
AST: 27 U/L (ref 15–41)
Albumin: 4.3 g/dL (ref 3.5–5.0)
Alkaline Phosphatase: 73 U/L (ref 38–126)
Anion gap: 8 (ref 5–15)
BUN: 22 mg/dL (ref 8–23)
CO2: 30 mmol/L (ref 22–32)
Calcium: 10 mg/dL (ref 8.9–10.3)
Chloride: 101 mmol/L (ref 98–111)
Creatinine: 0.94 mg/dL (ref 0.44–1.00)
GFR, Est AFR Am: >60 mL/min (ref >60)
GFR, Estimated: 56 mL/min — ABNORMAL LOW (ref >60)
Glucose, Bld: 99 mg/dL (ref 70–99)
Potassium: 3.6 mmol/L (ref 3.5–5.1)
Sodium: 139 mmol/L (ref 135–145)
Total Bilirubin: 0.6 mg/dL (ref 0.3–1.2)
Total Protein: 6.7 g/dL (ref 6.5–8.1)

## 2019-12-06 NOTE — Progress Notes (Signed)
Hematology and Oncology Follow Up Visit  [name] [id number] Jul 02, 1933 86 y.o. 12/06/2019   Principle Diagnosis:  IgA Kappa MGUS  Current Therapy:        Observation  Interim History:  [name] is here today for follow-up. She is doing fairly well but has had recent problems with high blood pressure. She has restarted her Norvasc and is trying to get a hold of her PCP and cardiologist to dsicuss further. She has noted that since restarting the Norvasc her headaches are better.  She has occasional palpitations and SOB with over exertion.  She has had no issue with infection. No fever, chills, n/v, cough, rash, dizziness, chest pain, abdominal pain or changes in bowel or bladder habits.  Earlier this year in March, M-spike was 0.3, IgA level 354 mg/dL and kappa light chains 2.95 mg/dL She has seen Gyn Onc for elevated CA 125. Her work up so far has been negative. She states that most recent level was 81. She is asymptomatic with this. Of note, she has family history of ovarian cancer and has had a total hysterectomy. The continue to follow along closely with her and she states she goes back in next month for repeat lab work.  No swelling in her extremities.  She has history neuropathy in the feet up to her ankles. Neurology monitors this and she states so far she has not been able to tolerate medication to treat it. She is currently taking a B-complex vitamin daily.  No falls or syncopal episodes to report.  She has maintained a good appetite and is staying well hydrated. Her weight is stable.   ECOG Performance Status: 1 - Symptomatic but completely ambulatory  Medications:  Allergies as of 12/06/2019   No Known Allergies     Medication List       Accurate as of December 06, 2019 12:09 PM. If you have any questions, ask your nurse or doctor.        STOP taking these medications   amoxicillin 500 MG capsule Commonly known as: AMOXIL Stopped by: Laverna Peace, NP     celecoxib 200 MG capsule Commonly known as: CELEBREX Stopped by: Laverna Peace, NP   venlafaxine XR 37.5 MG 24 hr capsule Commonly known as: EFFEXOR-XR Stopped by: Laverna Peace, NP     TAKE these medications   ALPRAZolam 0.5 MG tablet Commonly known as: XANAX Take 0.5 mg by mouth 2 (two) times daily as needed (restless legs).   Ambien CR 12.5 MG CR tablet Generic drug: zolpidem Take 12.5 mg by mouth at bedtime.   aspirin 81 MG tablet Take 81 mg by mouth daily.   calcium carbonate 1250 (500 Ca) MG tablet Commonly known as: OS-CAL - dosed in mg of elemental calcium Take 1 tablet by mouth daily.   clidinium-chlordiazePOXIDE 5-2.5 MG capsule Commonly known as: LIBRAX Take by mouth.   Dexilant 60 MG capsule Generic drug: dexlansoprazole Take 60 mg by mouth daily.   escitalopram 10 MG tablet Commonly known as: LEXAPRO Take 10 mg by mouth daily.   levothyroxine 25 MCG tablet Commonly known as: SYNTHROID Take 25 mcg by mouth daily.   MAGNESIUM PO Take 1 tablet by mouth daily.   metoprolol succinate 25 MG 24 hr tablet Commonly known as: TOPROL-XL Take 25 mg by mouth daily.   multivitamin tablet Take 1 tablet by mouth daily.   Norvasc 2.5 MG tablet Generic drug: amLODipine Take 2.5 mg by mouth daily. Pt. started on her own.  Stated: "I can get hold of my medical doctor."   rosuvastatin 5 MG tablet Commonly known as: CRESTOR Take 5 mg by mouth once a week.   triamterene-hydrochlorothiazide 37.5-25 MG tablet Commonly known as: MAXZIDE-25 Take 1 tablet by mouth daily.   vitamin C 500 MG tablet Commonly known as: ASCORBIC ACID Take 500 mg by mouth daily.   Vitamin D (Ergocalciferol) 1.25 MG (50000 UNIT) Caps capsule Commonly known as: DRISDOL Take 50,000 Units by mouth every 7 (seven) days. Take on Friday       Allergies: No Known Allergies  Past Medical History, Surgical history, Social history, and Family History were reviewed and  updated.  Review of Systems: All other 10 point review of systems is negative.   Physical Exam:  weight is 139 lb (63 kg). Her oral temperature is 98.9 F (37.2 C). Her blood pressure is 155/94 (abnormal) and her pulse is 98. Her respiration is 18 and oxygen saturation is 100%.   Wt Readings from Last 3 Encounters:  12/06/19 139 lb (63 kg)  09/24/19 139 lb (63 kg)  08/07/19 141 lb 1.9 oz (64 kg)    Ocular: Sclerae unicteric, pupils equal, round and reactive to light Ear-nose-throat: Oropharynx clear, dentition fair Lymphatic: No cervical or supraclavicular adenopathy Lungs no rales or rhonchi, good excursion bilaterally Heart regular rate and rhythm, no murmur appreciated Abd soft, nontender, positive bowel sounds, no liver or spleen tip palpated on exam, no fluid wave  MSK no focal spinal tenderness, no joint edema Neuro: non-focal, well-oriented, appropriate affect Breasts: Deferred   Lab Results  Component Value Date   WBC 5.1 12/06/2019   HGB 14.0 12/06/2019   HCT 42.5 12/06/2019   MCV 98.2 12/06/2019   PLT 319 12/06/2019   Lab Results  Component Value Date   FERRITIN 29 11/29/2018   IRON 103 11/29/2018   TIBC 399 11/29/2018   UIBC 296 11/29/2018   IRONPCTSAT 26 11/29/2018   Lab Results  Component Value Date   RBC 4.33 12/06/2019   Lab Results  Component Value Date   KPAFRELGTCHN 29.5 (H) 08/07/2019   LAMBDASER 23.2 08/07/2019   KAPLAMBRATIO 1.27 08/07/2019   Lab Results  Component Value Date   IGGSERUM 706 08/07/2019   IGGSERUM 902 08/07/2019   IGA 354 08/07/2019   IGA 415 08/07/2019   IGMSERUM 65 08/07/2019   IGMSERUM 79 08/07/2019   Lab Results  Component Value Date   TOTALPROTELP 6.9 08/07/2019   ALBUMINELP 4.1 08/07/2019   A1GS 0.2 08/07/2019   A2GS 0.7 08/07/2019   BETS 1.1 08/07/2019   GAMS 0.9 08/07/2019   MSPIKE 0.3 (H) 08/07/2019   SPEI Comment 08/24/2018     Chemistry      Component Value Date/Time   NA 139 12/06/2019 0949   K  3.6 12/06/2019 0949   CL 101 12/06/2019 0949   CO2 30 12/06/2019 0949   BUN 22 12/06/2019 0949   CREATININE 0.94 12/06/2019 0949      Component Value Date/Time   CALCIUM 10.0 12/06/2019 0949   ALKPHOS 73 12/06/2019 0949   AST 27 12/06/2019 0949   ALT 28 12/06/2019 0949   BILITOT 0.6 12/06/2019 0949       Impression and Plan: [name] is a very pleasant 86 yo caucasian female with IgA kappa MGUS. So far, her counts have remained stable.  Today's results are pending.  We will continue to follow along with her every 3 months.  She will contact our office with any  questions or concerns. We can certainly see her sooner if needed.   Laverna Peace, NP 7/9/202112:09 PM

## 2019-12-06 NOTE — Telephone Encounter (Signed)
Pt states that over the last month her BP has been "creeping up".  Pt has been checking BPs in the morning prior to meds and numbers have been 14-150/70-low 100s.  This morning at Oncology BP was 155/94 prior to medication.  After she got home, she took meds and BP was 104/70.  Pt took Amlodipine 2.5mg  a couple of days due to BP being elevated and it helped.  This morning she took the full 5mg  tablet for the first time since it was stopped last year.  Asked pt about BPs after meds prior to her restarting her Amlodipine on her own and they were as follows:  117/84- on Metoprolol and Maxzide 107/72- on Metoprolol and Maxzide 129/89- on Metoprolol and Maxzide 104/72- on Metoprolol, Maxzide, and Amlodipine 5mg   HR is mostly in the 90s-low 100s.  Advised I will send message to Dr. Tamala Julian for review and advisement.

## 2019-12-06 NOTE — Telephone Encounter (Signed)
Appointments scheduled calendar printed & mailed per 7/9 los 

## 2019-12-06 NOTE — Telephone Encounter (Signed)
New message   Pt c/o BP issue: STAT if pt c/o blurred vision, one-sided weakness or slurred speech  1. What are your last 5 BP readings? 155/94  140/101 127/97   2. Are you having any other symptoms (ex. Dizziness, headache, blurred vision, passed out)? Headache, ringing in ears  3. What is your BP issue? Blood pressure is elevated

## 2019-12-07 LAB — IGG, IGA, IGM
IgA: 353 mg/dL (ref 64–422)
IgG (Immunoglobin G), Serum: 683 mg/dL (ref 586–1602)
IgM (Immunoglobulin M), Srm: 70 mg/dL (ref 26–217)

## 2019-12-08 NOTE — Telephone Encounter (Signed)
The blood pressures that matter are the post therapy BP readings. If we react to pre-medication BP values we could get BP to low. Target BP for her age is 140/80 mmHg. BP below 504 systolic could cause symptoms.

## 2019-12-09 LAB — KAPPA/LAMBDA LIGHT CHAINS
Kappa free light chain: 26.4 mg/L — ABNORMAL HIGH (ref 3.3–19.4)
Kappa, lambda light chain ratio: 1.36 (ref 0.26–1.65)
Lambda free light chains: 19.4 mg/L (ref 5.7–26.3)

## 2019-12-09 NOTE — Telephone Encounter (Signed)
Spoke with pt and advised her to monitor her BP daily, 2-3 hours after medications, for 1-2 weeks and call back with those readings.  Explained we would not want to change meds based on elevated BPs prior to meds because then we risk Hypotension.  Pt verbalized understanding and was in agreement with this plan.

## 2019-12-10 LAB — PROTEIN ELECTROPHORESIS, SERUM, WITH REFLEX
A/G Ratio: 1.4 (ref 0.7–1.7)
Albumin ELP: 3.9 g/dL (ref 2.9–4.4)
Alpha-1-Globulin: 0.2 g/dL (ref 0.0–0.4)
Alpha-2-Globulin: 0.7 g/dL (ref 0.4–1.0)
Beta Globulin: 1 g/dL (ref 0.7–1.3)
Gamma Globulin: 0.8 g/dL (ref 0.4–1.8)
Globulin, Total: 2.7 g/dL (ref 2.2–3.9)
M-Spike, %: 0.2 g/dL — ABNORMAL HIGH
SPEP Interpretation: 0
Total Protein ELP: 6.6 g/dL (ref 6.0–8.5)

## 2019-12-10 LAB — IMMUNOFIXATION REFLEX, SERUM
IgA: 395 mg/dL (ref 64–422)
IgG (Immunoglobin G), Serum: 735 mg/dL (ref 586–1602)
IgM (Immunoglobulin M), Srm: 73 mg/dL (ref 26–217)

## 2019-12-12 ENCOUNTER — Telehealth: Payer: Self-pay

## 2019-12-12 NOTE — Telephone Encounter (Signed)
Called and informed patient of lab results per Dr.Ennever. patient verbalized understanding and appreciative of call. Went over next appt with her. Pt confirmed appt and will call interm if needed.

## 2019-12-12 NOTE — Telephone Encounter (Signed)
-----   Message from [name], MD sent at 12/11/2019  4:25 PM EDT ----- Call - the myeloma protein is lower!!  Now it is only 0.2.  this is fantastic.  [name]

## 2019-12-25 DIAGNOSIS — M25811 Other specified joint disorders, right shoulder: Secondary | ICD-10-CM | POA: Diagnosis not present

## 2019-12-25 DIAGNOSIS — M25511 Pain in right shoulder: Secondary | ICD-10-CM | POA: Diagnosis not present

## 2019-12-27 ENCOUNTER — Telehealth: Payer: Self-pay | Admitting: *Deleted

## 2019-12-27 NOTE — Telephone Encounter (Signed)
Primary Cardiologist:Henry Nicholes Stairs III, MD  Chart reviewed as part of pre-operative protocol coverage. Because of [name] past medical history and time since last visit, he/she will require a follow-up visit in order to better assess preoperative cardiovascular risk.  Pre-op covering staff: - Please schedule appointment and call patient to inform them. - Please contact requesting surgeon's office via preferred method (i.e, phone, fax) to inform them of need for appointment prior to surgery.  If applicable, this message will also be routed to pharmacy pool and/or primary cardiologist for input on holding anticoagulant/antiplatelet agent as requested below so that this information is available at time of patient's appointment.   Deberah Pelton, NP  12/27/2019, 3:33 PM

## 2019-12-27 NOTE — Telephone Encounter (Signed)
   [facility] Pre-operative Risk Assessment    HEARTCARE STAFF: - Please ensure there is not already an duplicate clearance open for this procedure. - Under Visit Info/Reason for Call, type in Other and utilize the format Clearance MM/DD/YY or Clearance TBD. Do not use dashes or single digits. - If request is for dental extraction, please clarify the # of teeth to be extracted.  Request for surgical clearance:  1. What type of surgery is being performed? RIGHT SHOULDER REVERSE ARTHROPLASTY   2. When is this surgery scheduled? TBD   3. What type of clearance is required (medical clearance vs. Pharmacy clearance to hold med vs. Both)? MEDICAL  4. Are there any medications that need to be held prior to surgery and how long? ASA    5. Practice name and name of physician performing surgery? [facility]; DR. Corene Cornea ROGERS   6. What is the office phone number? [phone]   7.   What is the office fax number? [place]  8.   Anesthesia type (None, local, MAC, general) ? CHOICE   Julaine Hua 12/27/2019, 3:05 PM  _________________________________________________________________   (provider comments below)

## 2019-12-30 NOTE — Telephone Encounter (Signed)
Left message for the pt to call the office to schedule a pre op appt.  

## 2019-12-31 NOTE — Telephone Encounter (Signed)
Pt has been scheduled to see Richardson Dopp, PA-C, 01/29/20 for clearance. Will route back to the requesting surgeon's office to make them aware that clearance will be discussed at that office visit.

## 2020-01-01 ENCOUNTER — Telehealth: Payer: Self-pay | Admitting: Interventional Cardiology

## 2020-01-01 NOTE — Telephone Encounter (Signed)
She said she was calling to give two weeks weeks of BP readings, refused to give to me. Patient would like to speak to Dr. Thompson Caul nurse.

## 2020-01-01 NOTE — Telephone Encounter (Signed)
BP readings 151/93, 153/94 in am some mornings,  125/68, few hours later 136/92, 137/107 HR 100     Evening  118/83,  Has been taking Toprol 25 mg at night.  Maxzide who tablet every am.  Norvasc 2.5 mg had cut out last year after visit.  Wants to know if Dr. Tamala Julian thinks she should restart Norvasc once every morning.  Okay to leave a message on home number.

## 2020-01-05 NOTE — Progress Notes (Signed)
Cardiology Office Note:    Date:  01/06/2020   ID:  [name], DOB 1933-10-20, MRN [id number]  PCP:  Burnard Bunting, MD  Cardiologist:  Sinclair Grooms, MD   Referring MD: Burnard Bunting, MD   Chief Complaint  Patient presents with  . Advice Only    Preop clearance  . Coronary Artery Disease  . Hypertension    History of Present Illness:    [name] is a 86 y.o. female with a hx of nonobstructive coronary disease, hypertension, and hyperlipidemia, who is requesting cardiology clearance for upcoming right shoulder arthroplasty and also concerns about elevated BP.  Concerns about her blood pressure.  They have been running higher than she typically is accustomed to.  Some days she may be 150/90 mmHg.  Last year amlodipine was discontinued because of relatively low blood pressures.  She was on 5 mg/day.  In the interval she has resumed amlodipine at 2.5 mg/day.  Blood pressure done twice in the office today demonstrates the initial blood pressure 124/76 mmHg by the nurse and 142/82 mmHg when it was rechecked.  She has significant right shoulder discomfort and will be having shoulder repair by Dr. Stann Mainland at [facility] in the not too distant future.  Past Medical History:  Diagnosis Date  . Arthritis   . Complication of anesthesia    Elevated blood pressure after having last Kyphoplasty; pt stated "I was given Morphine and had to stay overnight"  . DI (detrusor instability)   . Fracture of vertebra    x 3  . GERD (gastroesophageal reflux disease)   . History of hiatal hernia   . Hypertension   . Hypothyroidism   . Menopausal symptoms   . MGUS (monoclonal gammopathy of unknown significance) 12/27/2017   IgA  . Osteoporosis   . Peripheral neuropathy 12/27/2017  . Restless leg syndrome   . Varicose veins     Past Surgical History:  Procedure Laterality Date  . ABDOMINAL HYSTERECTOMY  1992   TAH,BSO  . BLADDER SUSPENSION  2011   CRYOMESH  . CARPAL  TUNNEL RELEASE Bilateral 1982  . CHOLECYSTECTOMY  1992  . COLONOSCOPY    . EYE SURGERY Bilateral    Cataract removal  . KYPHOPLASTY     X 3  . LUMBAR LAMINECTOMY WITH COFLEX 1 LEVEL N/A 12/29/2015   Procedure: L3-4 Laminectomy with Coflex;  Surgeon: Kristeen Miss, MD;  Location: [facility];  Service: Neurosurgery;  Laterality: N/A;  L3-4 Laminectomy with coflex  . OOPHORECTOMY     BSO  . REPLACEMENT TOTAL KNEE  2008,  2011   RIGHT 2008, LEFT 2011  . SPINAL FUSION  2011  . VERTEBRAL SURGERY     T-8, T-10. T-12  DR Roselee Culver    Current Medications: Current Meds  Medication Sig  . ALPRAZolam (XANAX) 0.5 MG tablet Take 0.5 mg by mouth 2 (two) times daily as needed (restless legs).  Marland Kitchen amLODipine (NORVASC) 2.5 MG tablet Take 2.5 mg by mouth daily. Pt. started on her own. Stated: "I can get hold of my medical doctor."  . aspirin 81 MG tablet Take 81 mg by mouth daily.  . calcium carbonate (OS-CAL - DOSED IN MG OF ELEMENTAL CALCIUM) 1250 MG tablet Take 1 tablet by mouth daily.    . celecoxib (CELEBREX) 200 MG capsule Take 200 mg by mouth as needed.  . clidinium-chlordiazePOXIDE (LIBRAX) 5-2.5 MG capsule Take by mouth.  . dexlansoprazole (DEXILANT) 60 MG capsule Take 60 mg  by mouth daily.  Marland Kitchen escitalopram (LEXAPRO) 10 MG tablet Take 10 mg by mouth daily.  Marland Kitchen levothyroxine (SYNTHROID, LEVOTHROID) 25 MCG tablet Take 25 mcg by mouth daily.    Marland Kitchen MAGNESIUM PO Take 1 tablet by mouth daily.  . metoprolol succinate (TOPROL-XL) 25 MG 24 hr tablet Take 25 mg by mouth daily.  . Multiple Vitamin (MULTIVITAMIN) tablet Take 1 tablet by mouth daily.    . rosuvastatin (CRESTOR) 5 MG tablet Take 5 mg by mouth once a week.  . triamterene-hydrochlorothiazide (MAXZIDE-25) 37.5-25 MG per tablet Take 1 tablet by mouth daily.    . vitamin C (ASCORBIC ACID) 500 MG tablet Take 500 mg by mouth daily.    . Vitamin D, Ergocalciferol, (DRISDOL) 50000 UNITS CAPS Take 50,000 Units by mouth every 7 (seven) days. Take on  Friday   . zolpidem (AMBIEN CR) 12.5 MG CR tablet Take 12.5 mg by mouth at bedtime.     Allergies:   Patient has no known allergies.   Social History   Socioeconomic History  . Marital status: Married    Spouse name: Not on file  . Number of children: 2  . Years of education: 45  . Highest education level: Not on file  Occupational History  . Occupation: Retired  Tobacco Use  . Smoking status: Never Smoker  . Smokeless tobacco: Never Used  Vaping Use  . Vaping Use: Never used  Substance and Sexual Activity  . Alcohol use: No  . Drug use: No  . Sexual activity: Yes    Birth control/protection: Surgical  Other Topics Concern  . Not on file  Social History Narrative   Lives  alone   Caffeine use: decaf only   Right handed    Social Determinants of Health   Financial Resource Strain:   . Difficulty of Paying Living Expenses:   Food Insecurity:   . Worried About Charity fundraiser in the Last Year:   . Arboriculturist in the Last Year:   Transportation Needs:   . Film/video editor (Medical):   Marland Kitchen Lack of Transportation (Non-Medical):   Physical Activity:   . Days of Exercise per Week:   . Minutes of Exercise per Session:   Stress:   . Feeling of Stress :   Social Connections:   . Frequency of Communication with Friends and Family:   . Frequency of Social Gatherings with Friends and Family:   . Attends Religious Services:   . Active Member of Clubs or Organizations:   . Attends Archivist Meetings:   Marland Kitchen Marital Status:      Family History: The patient's family history includes Cancer in her sister and sister; Diabetes in her sister and sister; Heart disease in her brother, brother, father, and mother; Hypertension in her brother, brother, and sister; Stroke in her father.  ROS:   Please see the history of present illness.    No lightheadedness or dizziness.  She is taking Toprol, Norvasc, and Maxide for blood pressure control.  No symptoms.  All  other systems reviewed and are negative.  EKGs/Labs/Other Studies Reviewed:    The following studies were reviewed today: No new data.  EKG:  EKG sinus rhythm, right atrial abnormality, otherwise unremarkable.  Recent Labs: 05/20/2019: TSH 2.500 12/06/2019: ALT 28; BUN 22; Creatinine 0.94; Hemoglobin 14.0; Platelet Count 319; Potassium 3.6; Sodium 139  Recent Lipid Panel    Component Value Date/Time   CHOL 264 (H) 08/07/2019 1155  TRIG 144 08/07/2019 1155   HDL 77 08/07/2019 1155   CHOLHDL 3.4 08/07/2019 1155   VLDL 29 08/07/2019 1155   LDLCALC 158 (H) 08/07/2019 1155    Physical Exam:    VS:  BP 124/76   Pulse 89   Ht 5\' 1"  (1.549 m)   Wt 142 lb 3.2 oz (64.5 kg)   SpO2 96%   BMI 26.87 kg/m     Wt Readings from Last 3 Encounters:  01/06/20 142 lb 3.2 oz (64.5 kg)  12/06/19 139 lb (63 kg)  09/24/19 139 lb (63 kg)     GEN: Healthy appearing considering age of 51. No acute distress HEENT: Normal NECK: No JVD. LYMPHATICS: No lymphadenopathy CARDIAC:  RRR without murmur, gallop, or edema. VASCULAR:  Normal Pulses. No bruits. RESPIRATORY:  Clear to auscultation without rales, wheezing or rhonchi  ABDOMEN: Soft, non-tender, non-distended, No pulsatile mass, MUSCULOSKELETAL: No deformity  SKIN: Warm and dry NEUROLOGIC:  Alert and oriented x 3 PSYCHIATRIC:  Normal affect   ASSESSMENT:    1. Coronary artery disease involving native coronary artery of native heart without angina pectoris   2. Preoperative clearance   3. Essential hypertension   4. Other hyperlipidemia   5. Educated about COVID-19 virus infection    PLAN:    In order of problems listed above:  1. Had a cardiac event or MI.  We are still practicing primary prevention of ischemic events. 2. She is cleared for right shoulder surgery by Dr. Stann Mainland.  Her risk should be low. 3. Blood pressure target is 140/80 mmHg.  If consistently elevated above 150/90 mmHg, can increase amlodipine to 5  mg/day. 4. Continue low-dose Crestor 5 mg/day to keep LDL cholesterol under reasonable control.  She has been unable to use higher intensity statin therapy.  Most recent LDL was 144 in March.  Statin therapy may need to be further intensified to try to achieve an LDL at least less than 100. 5. She has received an mRNA vaccine and is practicing social distancing.  Overall education and awareness concerning primary/secondary risk prevention was discussed in detail: LDL less than 70, hemoglobin A1c less than 7, blood pressure target less than 130/80 mmHg, >150 minutes of moderate aerobic activity per week, avoidance of smoking, weight control (via diet and exercise), and continued surveillance/management of/for obstructive sleep apnea.    Medication Adjustments/Labs and Tests Ordered: Current medicines are reviewed at length with the patient today.  Concerns regarding medicines are outlined above.  Orders Placed This Encounter  Procedures  . EKG 12-Lead   No orders of the defined types were placed in this encounter.   Patient Instructions  Medication Instructions:  Your physician recommends that you continue on your current medications as directed. Please refer to the Current Medication list given to you today.  *If you need a refill on your cardiac medications before your next appointment, please call your pharmacy*   Lab Work: None If you have labs (blood work) drawn today and your tests are completely normal, you will receive your results only by: Marland Kitchen MyChart Message (if you have MyChart) OR . A paper copy in the mail If you have any lab test that is abnormal or we need to change your treatment, we will call you to review the results.   Testing/Procedures: None   Follow-Up: At [facility], you and your health needs are our priority.  As part of our continuing mission to provide you with exceptional heart care, we have created  designated Provider Care Teams.  These Care Teams  include your primary Cardiologist (physician) and Advanced Practice Providers (APPs -  Physician Assistants and Nurse Practitioners) who all work together to provide you with the care you need, when you need it.  We recommend signing up for the patient portal called "MyChart".  Sign up information is provided on this After Visit Summary.  MyChart is used to connect with patients for Virtual Visits (Telemedicine).  Patients are able to view lab/test results, encounter notes, upcoming appointments, etc.  Non-urgent messages can be sent to your provider as well.   To learn more about what you can do with MyChart, go to [email or web address].    Your next appointment:   12 month(s)  The format for your next appointment:   In Person  Provider:   You may see Sinclair Grooms, MD or one of the following Advanced Practice Providers on your designated Care Team:    Truitt Merle, NP  Cecilie Kicks, NP  Kathyrn Drown, NP    Other Instructions      Signed, Sinclair Grooms, MD  01/06/2020 11:28 AM    [place]

## 2020-01-06 ENCOUNTER — Other Ambulatory Visit: Payer: Self-pay

## 2020-01-06 ENCOUNTER — Encounter: Payer: Self-pay | Admitting: Interventional Cardiology

## 2020-01-06 ENCOUNTER — Ambulatory Visit (INDEPENDENT_AMBULATORY_CARE_PROVIDER_SITE_OTHER): Payer: Medicare Other | Admitting: Interventional Cardiology

## 2020-01-06 VITALS — BP 124/76 | HR 89 | Ht 61.0 in | Wt 142.2 lb

## 2020-01-06 DIAGNOSIS — Z7189 Other specified counseling: Secondary | ICD-10-CM | POA: Diagnosis not present

## 2020-01-06 DIAGNOSIS — E7849 Other hyperlipidemia: Secondary | ICD-10-CM

## 2020-01-06 DIAGNOSIS — I10 Essential (primary) hypertension: Secondary | ICD-10-CM

## 2020-01-06 DIAGNOSIS — I2510 Atherosclerotic heart disease of native coronary artery without angina pectoris: Secondary | ICD-10-CM | POA: Diagnosis not present

## 2020-01-06 DIAGNOSIS — Z01818 Encounter for other preprocedural examination: Secondary | ICD-10-CM | POA: Diagnosis not present

## 2020-01-06 NOTE — Patient Instructions (Signed)

## 2020-01-06 NOTE — Telephone Encounter (Signed)
Pt seen in the office today.  

## 2020-01-13 DIAGNOSIS — R971 Elevated cancer antigen 125 [CA 125]: Secondary | ICD-10-CM | POA: Diagnosis not present

## 2020-01-15 ENCOUNTER — Other Ambulatory Visit: Payer: Self-pay | Admitting: Orthopedic Surgery

## 2020-01-15 DIAGNOSIS — M19011 Primary osteoarthritis, right shoulder: Secondary | ICD-10-CM

## 2020-01-15 NOTE — Progress Notes (Addendum)
COVID Vaccine Completed:  x2 Date COVID Vaccine completed:  07-06-19 & 07-27-19 COVID vaccine manufacturer: [place]   PCP - Burnard Bunting, MD Cardiologist - Daneen Schick, MD.  Last OV 01-06-20  Clearance from cardiology in [facility] dated 01-06-20  Chest x-ray - CT chest 10-25-19 Care Everywhere EKG - 01-06-20 in Epic Stress Test - 2016 in Epic ECHO -  Cardiac Cath -  Cardiac Telemetry Monitoring - 07-02-18  Sleep Study -  CPAP -   Fasting Blood Sugar -  Checks Blood Sugar _____ times a day  Blood Thinner Instructions: Aspirin Instructions: ASA 81 mg.  Pt is going to stop. Last Dose:  Anesthesia review: CAD.  Hx of anethesia complication of elevated BP  Patient denies shortness of breath, fever, cough and chest pain at PAT appointment   Patient verbalized understanding of instructions that were given to them at the PAT appointment. Patient was also instructed that they will need to review over the PAT instructions again at home before surgery.

## 2020-01-15 NOTE — Patient Instructions (Addendum)
DUE TO COVID-19 ONLY ONE VISITOR IS ALLOWED TO COME WITH YOU AND STAY IN THE WAITING ROOM ONLY  DURING  PRE OP AND PROCEDURE.   IF YOU WILL BE ADMITTED INTO THE HOSPITAL YOU ARE ALLOWED ONE SUPPORT PERSON DURING VISITATION  HOURS  ONLY (10AM -8PM)    The support person may change daily.  The support person must pass our screening, gel in and out, and wear a mask at all times, including in the patients room.  Patients must also wear a mask when staff or their support person are in the room.   COVID SWAB TESTING MUST BE COMPLETED ON:  Monday, 01-27-20 @ 10 AM    [address] [place], [place] [address]  (Must self quarantine after testing. Follow instructions on handout.)        Your procedure is scheduled on:  Thursday, 01-30-20   Report to [facility] Main  Entrance     Report to admitting at 8:00 AM   Call this number if you have problems the morning of surgery [phone]   Do not eat food :After Midnight.   May have liquids until 7:30 AM   day of surgery  CLEAR LIQUID DIET  Foods Allowed                                                                     Foods Excluded  Water, Black Coffee and tea, regular and decaf             liquids that you cannot  Plain Jell-O in any flavor  (No red)                                    see through such as: Fruit ices (not with fruit pulp)                                     milk, soups, orange juice              Iced Popsicles (No red)                                      All solid food                                   Apple juices Sports drinks like Gatorade (No red) Lightly seasoned clear broth or consume(fat free) Sugar, honey syrup     Complete one Ensure drink the morning of surgery at  7:30 AM  the day of surgery.  Oral Hygiene is also important to reduce your risk of infection.                                    Remember - BRUSH YOUR TEETH THE MORNING OF SURGERY WITH YOUR REGULAR TOOTHPASTE   Do NOT smoke after  Midnight  Take these medicines the morning of surgery with A SIP OF WATER: Alprazolam, Amlodipine, Dexilant, Escitalopram, Levothyroxine,  Metoprolol                                You may not have any metal on your body including hair pins, jewelry, and body piercings             Do not wear make-up, lotions, powders, perfumes/cologne, or deodorant             Do not wear nail polish.  Do not shave  48 hours prior to surgery.                  Do not bring valuables to the hospital. [place].   Contacts, dentures or bridgework may not be worn into surgery.   Bring small overnight bag day of surgery.                  Please read over the following fact sheets you were given: IF YOU HAVE QUESTIONS ABOUT YOUR PRE OP INSTRUCTIONS  PLEASE  CALL [facility]- Preparing for Total Shoulder Arthroplasty    Before surgery, you can play an important role. Because skin is not sterile, your skin needs to be as free of germs as possible. You can reduce the number of germs on your skin by using the following products.  Benzoyl Peroxide Gel o Reduces the number of germs present on the skin o Applied twice a day to shoulder area starting two days before surgery    ==================================================================  Please follow these instructions carefully:  BENZOYL PEROXIDE 5% GEL  Please do not use if you have an allergy to benzoyl peroxide.   If your skin becomes reddened/irritated stop using the benzoyl peroxide.  Starting two days before surgery, apply as follows: 1. Apply benzoyl peroxide in the morning and at night. Apply after taking a shower. If you are not taking a shower clean entire shoulder front, back, and side along with the armpit with a clean wet washcloth.  2. Place a quarter-sized dollop on your shoulder and rub in thoroughly, making sure to cover the front, back, and side of your shoulder, along with the  armpit.   2 days before ____ AM   ____ PM              1 day before ____ AM   ____ PM                         3. Do this twice a day for two days.  (Last application is the night before surgery, AFTER using the CHG soap as described below).  4. Do NOT apply benzoyl peroxide gel on the day of surgery.    [facility] - Preparing for Surgery Before surgery, you can play an important role.  Because skin is not sterile, your skin needs to be as free of germs as possible.  You can reduce the number of germs on your skin by washing with CHG (chlorahexidine gluconate) soap before surgery.  CHG is an antiseptic cleaner which kills germs and bonds with the skin to continue killing germs even after washing. Please DO NOT use if you have an allergy to CHG or antibacterial soaps.  If your skin becomes reddened/irritated stop using the CHG and  inform your nurse when you arrive at Short Stay. Do not shave (including legs and underarms) for at least 48 hours prior to the first CHG shower.  You may shave your face/neck.  Please follow these instructions carefully:  1.  Shower with CHG Soap the night before surgery and the  morning of surgery.  2.  If you choose to wash your hair, wash your hair first as usual with your normal  shampoo.  3.  After you shampoo, rinse your hair and body thoroughly to remove the shampoo.                             4.  Use CHG as you would any other liquid soap.  You can apply chg directly to the skin and wash.  Gently with a scrungie or clean washcloth.  5.  Apply the CHG Soap to your body ONLY FROM THE NECK DOWN.   Do   not use on face/ open                           Wound or open sores. Avoid contact with eyes, ears mouth and   genitals (private parts).                       Wash face,  Genitals (private parts) with your normal soap.             6.  Wash thoroughly, paying special attention to the area where your    surgery  will be performed.  7.  Thoroughly rinse your body  with warm water from the neck down.  8.  DO NOT shower/wash with your normal soap after using and rinsing off the CHG Soap.                9.  Pat yourself dry with a clean towel.            10.  Wear clean pajamas.            11.  Place clean sheets on your bed the night of your first shower and do not  sleep with pets. Day of Surgery : Do not apply any lotions/deodorants the morning of surgery.  Please wear clean clothes to the hospital/surgery center.  FAILURE TO FOLLOW THESE INSTRUCTIONS MAY RESULT IN THE CANCELLATION OF YOUR SURGERY  PATIENT SIGNATURE_________________________________  NURSE SIGNATURE__________________________________  ________________________________________________________________________   Adam Phenix  An incentive spirometer is a tool that can help keep your lungs clear and active. This tool measures how well you are filling your lungs with each breath. Taking long deep breaths may help reverse or decrease the chance of developing breathing (pulmonary) problems (especially infection) following:  A long period of time when you are unable to move or be active. BEFORE THE PROCEDURE   If the spirometer includes an indicator to show your best effort, your nurse or respiratory therapist will set it to a desired goal.  If possible, sit up straight or lean slightly forward. Try not to slouch.  Hold the incentive spirometer in an upright position. INSTRUCTIONS FOR USE  1. Sit on the edge of your bed if possible, or sit up as far as you can in bed or on a chair. 2. Hold the incentive spirometer in an upright position. 3. Breathe out normally. 4. Place the mouthpiece in your mouth and seal your  lips tightly around it. 5. Breathe in slowly and as deeply as possible, raising the piston or the ball toward the top of the column. 6. Hold your breath for 3-5 seconds or for as long as possible. Allow the piston or ball to fall to the bottom of the column. 7. Remove  the mouthpiece from your mouth and breathe out normally. 8. Rest for a few seconds and repeat Steps 1 through 7 at least 10 times every 1-2 hours when you are awake. Take your time and take a few normal breaths between deep breaths. 9. The spirometer may include an indicator to show your best effort. Use the indicator as a goal to work toward during each repetition. 10. After each set of 10 deep breaths, practice coughing to be sure your lungs are clear. If you have an incision (the cut made at the time of surgery), support your incision when coughing by placing a pillow or rolled up towels firmly against it. Once you are able to get out of bed, walk around indoors and cough well. You may stop using the incentive spirometer when instructed by your caregiver.  RISKS AND COMPLICATIONS  Take your time so you do not get dizzy or light-headed.  If you are in pain, you may need to take or ask for pain medication before doing incentive spirometry. It is harder to take a deep breath if you are having pain. AFTER USE  Rest and breathe slowly and easily.  It can be helpful to keep track of a log of your progress. Your caregiver can provide you with a simple table to help with this. If you are using the spirometer at home, follow these instructions: [place] IF:   You are having difficultly using the spirometer.  You have trouble using the spirometer as often as instructed.  Your pain medication is not giving enough relief while using the spirometer.  You develop fever of 100.5 F (38.1 C) or higher. SEEK IMMEDIATE MEDICAL CARE IF:   You cough up bloody sputum that had not been present before.  You develop fever of 102 F (38.9 C) or greater.  You develop worsening pain at or near the incision site. MAKE SURE YOU:   Understand these instructions.  Will watch your condition.  Will get help right away if you are not doing well or get worse. Document Released: 09/26/2006 Document  Revised: 08/08/2011 Document Reviewed: 11/27/2006 [facility] Patient Information 2014 [place], [place].   ________________________________________________________________________

## 2020-01-16 DIAGNOSIS — R971 Elevated cancer antigen 125 [CA 125]: Secondary | ICD-10-CM | POA: Diagnosis not present

## 2020-01-16 DIAGNOSIS — Z79899 Other long term (current) drug therapy: Secondary | ICD-10-CM | POA: Diagnosis not present

## 2020-01-16 DIAGNOSIS — E785 Hyperlipidemia, unspecified: Secondary | ICD-10-CM | POA: Diagnosis not present

## 2020-01-16 DIAGNOSIS — T451X5A Adverse effect of antineoplastic and immunosuppressive drugs, initial encounter: Secondary | ICD-10-CM | POA: Diagnosis not present

## 2020-01-16 DIAGNOSIS — I10 Essential (primary) hypertension: Secondary | ICD-10-CM | POA: Diagnosis not present

## 2020-01-16 DIAGNOSIS — G620 Drug-induced polyneuropathy: Secondary | ICD-10-CM | POA: Diagnosis not present

## 2020-01-16 DIAGNOSIS — D472 Monoclonal gammopathy: Secondary | ICD-10-CM | POA: Diagnosis not present

## 2020-01-16 DIAGNOSIS — E039 Hypothyroidism, unspecified: Secondary | ICD-10-CM | POA: Diagnosis not present

## 2020-01-20 ENCOUNTER — Ambulatory Visit
Admission: RE | Admit: 2020-01-20 | Discharge: 2020-01-20 | Disposition: A | Discharge status: Home / Self Care | Payer: Medicare Other | Source: Ambulatory Visit | Attending: Orthopedic Surgery | Admitting: Orthopedic Surgery

## 2020-01-20 DIAGNOSIS — M19011 Primary osteoarthritis, right shoulder: Secondary | ICD-10-CM

## 2020-01-20 DIAGNOSIS — Z01818 Encounter for other preprocedural examination: Secondary | ICD-10-CM | POA: Diagnosis not present

## 2020-01-21 DIAGNOSIS — R971 Elevated cancer antigen 125 [CA 125]: Secondary | ICD-10-CM | POA: Diagnosis not present

## 2020-01-21 DIAGNOSIS — K5730 Diverticulosis of large intestine without perforation or abscess without bleeding: Secondary | ICD-10-CM | POA: Diagnosis not present

## 2020-01-21 DIAGNOSIS — D472 Monoclonal gammopathy: Secondary | ICD-10-CM | POA: Diagnosis not present

## 2020-01-22 ENCOUNTER — Encounter (HOSPITAL_COMMUNITY): Payer: Self-pay

## 2020-01-22 ENCOUNTER — Encounter (HOSPITAL_COMMUNITY)
Admission: RE | Admit: 2020-01-22 | Discharge: 2020-01-22 | Disposition: A | Discharge status: Home / Self Care | Payer: Medicare Other | Source: Ambulatory Visit | Attending: Orthopedic Surgery | Admitting: Orthopedic Surgery

## 2020-01-22 ENCOUNTER — Other Ambulatory Visit: Payer: Self-pay

## 2020-01-22 DIAGNOSIS — Z01812 Encounter for preprocedural laboratory examination: Secondary | ICD-10-CM | POA: Insufficient documentation

## 2020-01-22 HISTORY — DX: Pneumonia, unspecified organism: J18.9

## 2020-01-22 HISTORY — DX: Atherosclerotic heart disease of native coronary artery without angina pectoris: I25.10

## 2020-01-22 LAB — BASIC METABOLIC PANEL
Anion gap: 7 (ref 5–15)
BUN: 22 mg/dL (ref 8–23)
CO2: 29 mmol/L (ref 22–32)
Calcium: 9.2 mg/dL (ref 8.9–10.3)
Chloride: 99 mmol/L (ref 98–111)
Creatinine, Ser: 1.08 mg/dL — ABNORMAL HIGH (ref 0.44–1.00)
GFR calc Af Amer: 55 mL/min — ABNORMAL LOW (ref >60)
GFR calc non Af Amer: 47 mL/min — ABNORMAL LOW (ref >60)
Glucose, Bld: 84 mg/dL (ref 70–99)
Potassium: 3.6 mmol/L (ref 3.5–5.1)
Sodium: 135 mmol/L (ref 135–145)

## 2020-01-22 LAB — SURGICAL PCR SCREEN
MRSA, PCR: NEGATIVE
Staphylococcus aureus: POSITIVE — AB

## 2020-01-22 LAB — CBC
HCT: 42.7 % (ref 36.0–46.0)
Hemoglobin: 14.2 g/dL (ref 12.0–15.0)
MCH: 32.9 pg (ref 26.0–34.0)
MCHC: 33.3 g/dL (ref 30.0–36.0)
MCV: 98.8 fL (ref 80.0–100.0)
Platelets: 305 10*3/uL (ref 150–400)
RBC: 4.32 MIL/uL (ref 3.87–5.11)
RDW: 12.8 % (ref 11.5–15.5)
WBC: 5.5 10*3/uL (ref 4.0–10.5)
nRBC: 0 % (ref 0.0–0.2)

## 2020-01-22 NOTE — Progress Notes (Signed)
PCR results sent to Dr. Rogers to review. 

## 2020-01-24 NOTE — Progress Notes (Signed)
Anesthesia Chart Review   Case: [id number] Date/Time: 01/30/20 1025   Procedure: REVERSE SHOULDER ARTHROPLASTY (Right Shoulder) - 2.5 hrs   Anesthesia type: Choice   Pre-op diagnosis: Right shoulder rotator cuff arthropathy   Location: [facility] ROOM 08 / [facility] ORS   Surgeons: Nicholes Stairs, MD      DISCUSSION:86 y.o. never smoker with h/o HTN, GERD, CAD, right shoulder rotator cuff OA scheduled for above procedure 01/30/2020 with Dr. Victorino December.   Last seen by cardiology 01/06/2020.  Per OV note, "Had a cardiac event or MI.  We are still practicing primary prevention of ischemic events. She is cleared for right shoulder surgery by Dr. Stann Mainland.  Her risk should be low."  Anticipate pt can proceed with planned procedure barring acute status change.   VS: BP 139/72   Pulse 88   Temp 36.9 C (Oral)   Resp 16   Ht 5\' 1"  (1.549 m)   Wt 64 kg   SpO2 98%   BMI 26.64 kg/m   PROVIDERS: Burnard Bunting, MD is PCP   Daneen Schick, MD is Cardiologist  LABS: Labs reviewed: Acceptable for surgery. (all labs ordered are listed, but only abnormal results are displayed)  Labs Reviewed  SURGICAL PCR SCREEN - Abnormal; Notable for the following components:      Result Value   Staphylococcus aureus POSITIVE (*)    All other components within normal limits  BASIC METABOLIC PANEL - Abnormal; Notable for the following components:   Creatinine, Ser 1.08 (*)    GFR calc non Af Amer 47 (*)    GFR calc Af Amer 55 (*)    All other components within normal limits  CBC     IMAGES:   EKG: 01/06/2020 Rate 89 bpm  NSR  CV: Myocardial Perfusion 03/12/2015  Nuclear stress EF: 83%.  There was no ST segment deviation noted during stress.  The study is normal. No evidence of ischemia.  This is a low risk study.  Past Medical History:  Diagnosis Date  . Arthritis   . Complication of anesthesia    Elevated blood pressure after having last Kyphoplasty; pt stated "I was given Morphine and had to  stay overnight"  . Coronary artery disease   . DI (detrusor instability)   . Fracture of vertebra    x 3  . GERD (gastroesophageal reflux disease)   . History of hiatal hernia   . Hypertension   . Hypothyroidism   . Menopausal symptoms   . MGUS (monoclonal gammopathy of unknown significance) 12/27/2017   IgA  . Osteoporosis   . Peripheral neuropathy 12/27/2017  . Peripheral neuropathy   . Pneumonia   . Restless leg syndrome   . Varicose veins     Past Surgical History:  Procedure Laterality Date  . ABDOMINAL HYSTERECTOMY  1992   TAH,BSO  . BLADDER SUSPENSION  2011   CRYOMESH  . CARPAL TUNNEL RELEASE Bilateral 1982  . CHOLECYSTECTOMY  1992  . COLONOSCOPY    . EYE SURGERY Bilateral    Cataract removal  . KYPHOPLASTY     X 3  . LUMBAR LAMINECTOMY WITH COFLEX 1 LEVEL N/A 12/29/2015   Procedure: L3-4 Laminectomy with Coflex;  Surgeon: Kristeen Miss, MD;  Location: [place] [facility] NEURO ORS;  Service: Neurosurgery;  Laterality: N/A;  L3-4 Laminectomy with coflex  . OOPHORECTOMY     BSO  . REPLACEMENT TOTAL KNEE  2008,  2011   RIGHT 2008, LEFT 2011  . SPINAL FUSION  2011  .  VERTEBRAL SURGERY     T-8, T-10. T-12  DR Roselee Culver    MEDICATIONS: . ALPRAZolam (XANAX) 0.5 MG tablet  . amLODipine (NORVASC) 5 MG tablet  . aspirin 81 MG tablet  . calcium carbonate (OS-CAL - DOSED IN MG OF ELEMENTAL CALCIUM) 1250 MG tablet  . celecoxib (CELEBREX) 200 MG capsule  . clidinium-chlordiazePOXIDE (LIBRAX) 5-2.5 MG capsule  . dexlansoprazole (DEXILANT) 60 MG capsule  . escitalopram (LEXAPRO) 10 MG tablet  . levothyroxine (SYNTHROID, LEVOTHROID) 25 MCG tablet  . MAGNESIUM PO  . metoprolol succinate (TOPROL-XL) 25 MG 24 hr tablet  . Multiple Vitamin (MULTIVITAMIN) tablet  . rosuvastatin (CRESTOR) 5 MG tablet  . triamterene-hydrochlorothiazide (MAXZIDE-25) 37.5-25 MG per tablet  . vitamin C (ASCORBIC ACID) 500 MG tablet  . Vitamin D, Ergocalciferol, (DRISDOL) 50000 UNITS CAPS  . zolpidem (AMBIEN CR)  12.5 MG CR tablet   No current facility-administered medications for this encounter.     Konrad Felix, PA-C [facility] Pre-Surgical Testing [phone]

## 2020-01-27 ENCOUNTER — Other Ambulatory Visit (HOSPITAL_COMMUNITY)
Admission: RE | Admit: 2020-01-27 | Discharge: 2020-01-27 | Disposition: A | Discharge status: Home / Self Care | Payer: Medicare Other | Source: Ambulatory Visit | Attending: Orthopedic Surgery | Admitting: Orthopedic Surgery

## 2020-01-27 DIAGNOSIS — Z01812 Encounter for preprocedural laboratory examination: Secondary | ICD-10-CM | POA: Insufficient documentation

## 2020-01-27 DIAGNOSIS — Z20822 Contact with and (suspected) exposure to covid-19: Secondary | ICD-10-CM | POA: Insufficient documentation

## 2020-01-27 LAB — SARS CORONAVIRUS 2 (TAT 6-24 HRS): SARS Coronavirus 2: NEGATIVE

## 2020-01-28 ENCOUNTER — Other Ambulatory Visit: Payer: Medicare Other

## 2020-01-29 ENCOUNTER — Ambulatory Visit: Payer: Medicare Other | Admitting: Physician Assistant

## 2020-01-30 ENCOUNTER — Encounter (HOSPITAL_COMMUNITY): Payer: Self-pay | Admitting: Orthopedic Surgery

## 2020-01-30 ENCOUNTER — Encounter (HOSPITAL_COMMUNITY)
Admission: RE | Disposition: A | Discharge status: Home / Self Care | Payer: Self-pay | Source: Home / Self Care | Attending: Orthopedic Surgery

## 2020-01-30 ENCOUNTER — Other Ambulatory Visit: Payer: Self-pay

## 2020-01-30 ENCOUNTER — Ambulatory Visit (HOSPITAL_COMMUNITY)
Admission: RE | Admit: 2020-01-30 | Discharge: 2020-01-30 | Disposition: A | Discharge status: Home / Self Care | Payer: Medicare Other | Attending: Orthopedic Surgery | Admitting: Orthopedic Surgery

## 2020-01-30 ENCOUNTER — Ambulatory Visit (HOSPITAL_COMMUNITY): Payer: Medicare Other

## 2020-01-30 ENCOUNTER — Ambulatory Visit (HOSPITAL_COMMUNITY): Payer: Medicare Other | Admitting: Certified Registered"

## 2020-01-30 ENCOUNTER — Ambulatory Visit (HOSPITAL_COMMUNITY): Payer: Medicare Other | Admitting: Physician Assistant

## 2020-01-30 DIAGNOSIS — K219 Gastro-esophageal reflux disease without esophagitis: Secondary | ICD-10-CM | POA: Insufficient documentation

## 2020-01-30 DIAGNOSIS — Z01818 Encounter for other preprocedural examination: Secondary | ICD-10-CM | POA: Diagnosis not present

## 2020-01-30 DIAGNOSIS — E039 Hypothyroidism, unspecified: Secondary | ICD-10-CM | POA: Insufficient documentation

## 2020-01-30 DIAGNOSIS — Z96611 Presence of right artificial shoulder joint: Secondary | ICD-10-CM

## 2020-01-30 DIAGNOSIS — Z981 Arthrodesis status: Secondary | ICD-10-CM | POA: Diagnosis not present

## 2020-01-30 DIAGNOSIS — Z96653 Presence of artificial knee joint, bilateral: Secondary | ICD-10-CM | POA: Insufficient documentation

## 2020-01-30 DIAGNOSIS — Z8249 Family history of ischemic heart disease and other diseases of the circulatory system: Secondary | ICD-10-CM | POA: Diagnosis not present

## 2020-01-30 DIAGNOSIS — Z79899 Other long term (current) drug therapy: Secondary | ICD-10-CM | POA: Diagnosis not present

## 2020-01-30 DIAGNOSIS — E785 Hyperlipidemia, unspecified: Secondary | ICD-10-CM | POA: Diagnosis not present

## 2020-01-30 DIAGNOSIS — I2510 Atherosclerotic heart disease of native coronary artery without angina pectoris: Secondary | ICD-10-CM | POA: Diagnosis not present

## 2020-01-30 DIAGNOSIS — M19011 Primary osteoarthritis, right shoulder: Secondary | ICD-10-CM | POA: Diagnosis not present

## 2020-01-30 DIAGNOSIS — G2581 Restless legs syndrome: Secondary | ICD-10-CM | POA: Diagnosis not present

## 2020-01-30 DIAGNOSIS — Z791 Long term (current) use of non-steroidal anti-inflammatories (NSAID): Secondary | ICD-10-CM | POA: Diagnosis not present

## 2020-01-30 DIAGNOSIS — M12811 Other specific arthropathies, not elsewhere classified, right shoulder: Secondary | ICD-10-CM | POA: Diagnosis not present

## 2020-01-30 DIAGNOSIS — Z471 Aftercare following joint replacement surgery: Secondary | ICD-10-CM | POA: Diagnosis not present

## 2020-01-30 DIAGNOSIS — M810 Age-related osteoporosis without current pathological fracture: Secondary | ICD-10-CM | POA: Diagnosis not present

## 2020-01-30 DIAGNOSIS — R600 Localized edema: Secondary | ICD-10-CM | POA: Diagnosis not present

## 2020-01-30 DIAGNOSIS — Z79890 Hormone replacement therapy (postmenopausal): Secondary | ICD-10-CM | POA: Diagnosis not present

## 2020-01-30 DIAGNOSIS — I10 Essential (primary) hypertension: Secondary | ICD-10-CM | POA: Diagnosis not present

## 2020-01-30 DIAGNOSIS — G8918 Other acute postprocedural pain: Secondary | ICD-10-CM | POA: Diagnosis not present

## 2020-01-30 DIAGNOSIS — Z7982 Long term (current) use of aspirin: Secondary | ICD-10-CM | POA: Diagnosis not present

## 2020-01-30 HISTORY — PX: REVERSE SHOULDER ARTHROPLASTY: SHX5054

## 2020-01-30 SURGERY — ARTHROPLASTY, SHOULDER, TOTAL, REVERSE
Anesthesia: General | Site: Shoulder | Laterality: Right

## 2020-01-30 MED ORDER — BUPIVACAINE HCL (PF) 0.5 % IJ SOLN
INTRAMUSCULAR | Status: DC | PRN
  Administered 2020-01-30: 15 mL via PERINEURAL

## 2020-01-30 MED ORDER — DEXAMETHASONE SODIUM PHOSPHATE 10 MG/ML IJ SOLN
INTRAMUSCULAR | Status: DC | PRN
  Administered 2020-01-30: 5 mg via INTRAVENOUS

## 2020-01-30 MED ORDER — VANCOMYCIN HCL 1000 MG IV SOLR
INTRAVENOUS | Status: AC
  Filled 2020-01-30: qty 1000

## 2020-01-30 MED ORDER — PHENYLEPHRINE HCL-NACL 10-0.9 MG/250ML-% IV SOLN
INTRAVENOUS | Status: AC
  Filled 2020-01-30: qty 250

## 2020-01-30 MED ORDER — ONDANSETRON HCL 4 MG/2ML IJ SOLN
INTRAMUSCULAR | Status: DC | PRN
  Administered 2020-01-30: 4 mg via INTRAVENOUS

## 2020-01-30 MED ORDER — SUCCINYLCHOLINE CHLORIDE 200 MG/10ML IV SOSY
PREFILLED_SYRINGE | INTRAVENOUS | Status: AC
  Filled 2020-01-30: qty 10

## 2020-01-30 MED ORDER — HYDROMORPHONE HCL 1 MG/ML IJ SOLN: 0.2500 mg | INTRAMUSCULAR | Status: DC | PRN

## 2020-01-30 MED ORDER — ONDANSETRON HCL 4 MG/2ML IJ SOLN
INTRAMUSCULAR | Status: AC
  Filled 2020-01-30: qty 2

## 2020-01-30 MED ORDER — BUPIVACAINE LIPOSOME 1.3 % IJ SUSP
INTRAMUSCULAR | Status: DC | PRN
  Administered 2020-01-30: 10 mL via PERINEURAL

## 2020-01-30 MED ORDER — LACTATED RINGERS IV BOLUS
500.0000 mL | Freq: Once | INTRAVENOUS | Status: AC
  Administered 2020-01-30: 500 mL via INTRAVENOUS

## 2020-01-30 MED ORDER — PHENYLEPHRINE HCL-NACL 10-0.9 MG/250ML-% IV SOLN
INTRAVENOUS | Status: DC | PRN
  Administered 2020-01-30: 20 ug/min via INTRAVENOUS

## 2020-01-30 MED ORDER — LIDOCAINE 2% (20 MG/ML) 5 ML SYRINGE
INTRAMUSCULAR | Status: AC
  Filled 2020-01-30: qty 5

## 2020-01-30 MED ORDER — OXYCODONE HCL 5 MG PO TABS: 5.0000 mg | ORAL_TABLET | Freq: Four times a day (QID) | ORAL | 0 refills | Status: DC | PRN

## 2020-01-30 MED ORDER — ROCURONIUM BROMIDE 10 MG/ML (PF) SYRINGE
PREFILLED_SYRINGE | INTRAVENOUS | Status: AC
  Filled 2020-01-30: qty 10

## 2020-01-30 MED ORDER — PROPOFOL 10 MG/ML IV BOLUS
INTRAVENOUS | Status: DC | PRN
  Administered 2020-01-30: 100 mg via INTRAVENOUS

## 2020-01-30 MED ORDER — ORAL CARE MOUTH RINSE: 15.0000 mL | Freq: Once | OROMUCOSAL | Status: AC

## 2020-01-30 MED ORDER — FENTANYL CITRATE (PF) 100 MCG/2ML IJ SOLN
INTRAMUSCULAR | Status: AC
  Administered 2020-01-30: 75 ug via INTRAVENOUS
  Filled 2020-01-30: qty 2

## 2020-01-30 MED ORDER — LACTATED RINGERS IV BOLUS: 250.0000 mL | Freq: Once | INTRAVENOUS | Status: DC

## 2020-01-30 MED ORDER — MIDAZOLAM HCL 2 MG/2ML IJ SOLN
INTRAMUSCULAR | Status: AC
  Filled 2020-01-30: qty 2

## 2020-01-30 MED ORDER — CEFAZOLIN SODIUM-DEXTROSE 2-4 GM/100ML-% IV SOLN
2.0000 g | INTRAVENOUS | Status: AC
  Administered 2020-01-30: 2 g via INTRAVENOUS
  Filled 2020-01-30: qty 100

## 2020-01-30 MED ORDER — TRANEXAMIC ACID-NACL 1000-0.7 MG/100ML-% IV SOLN
1000.0000 mg | INTRAVENOUS | Status: AC
  Administered 2020-01-30: 1000 mg via INTRAVENOUS
  Filled 2020-01-30: qty 100

## 2020-01-30 MED ORDER — ONDANSETRON 4 MG PO TBDP: 4.0000 mg | ORAL_TABLET | Freq: Three times a day (TID) | ORAL | 0 refills | Status: DC | PRN

## 2020-01-30 MED ORDER — ROCURONIUM BROMIDE 10 MG/ML (PF) SYRINGE
PREFILLED_SYRINGE | INTRAVENOUS | Status: DC | PRN
  Administered 2020-01-30: 30 mg via INTRAVENOUS

## 2020-01-30 MED ORDER — LIDOCAINE 2% (20 MG/ML) 5 ML SYRINGE
INTRAMUSCULAR | Status: DC | PRN
  Administered 2020-01-30: 100 mg via INTRAVENOUS

## 2020-01-30 MED ORDER — FENTANYL CITRATE (PF) 100 MCG/2ML IJ SOLN
INTRAMUSCULAR | Status: AC
  Filled 2020-01-30: qty 2

## 2020-01-30 MED ORDER — DEXAMETHASONE SODIUM PHOSPHATE 10 MG/ML IJ SOLN
INTRAMUSCULAR | Status: AC
  Filled 2020-01-30: qty 1

## 2020-01-30 MED ORDER — LACTATED RINGERS IV SOLN: INTRAVENOUS | Status: DC

## 2020-01-30 MED ORDER — STERILE WATER FOR IRRIGATION IR SOLN
Status: DC | PRN
  Administered 2020-01-30: 2000 mL

## 2020-01-30 MED ORDER — PROPOFOL 10 MG/ML IV BOLUS
INTRAVENOUS | Status: AC
  Filled 2020-01-30: qty 20

## 2020-01-30 MED ORDER — MIDAZOLAM HCL 2 MG/2ML IJ SOLN: 1.0000 mg | INTRAMUSCULAR | Status: DC

## 2020-01-30 MED ORDER — SUCCINYLCHOLINE CHLORIDE 200 MG/10ML IV SOSY
PREFILLED_SYRINGE | INTRAVENOUS | Status: DC | PRN
  Administered 2020-01-30: 100 mg via INTRAVENOUS

## 2020-01-30 MED ORDER — CHLORHEXIDINE GLUCONATE 0.12 % MT SOLN
15.0000 mL | Freq: Once | OROMUCOSAL | Status: AC
  Administered 2020-01-30: 15 mL via OROMUCOSAL

## 2020-01-30 MED ORDER — 0.9 % SODIUM CHLORIDE (POUR BTL) OPTIME
TOPICAL | Status: DC | PRN
  Administered 2020-01-30: 1000 mL

## 2020-01-30 MED ORDER — FENTANYL CITRATE (PF) 100 MCG/2ML IJ SOLN: 50.0000 ug | INTRAMUSCULAR | Status: DC

## 2020-01-30 MED ORDER — VANCOMYCIN HCL 1000 MG IV SOLR
INTRAVENOUS | Status: DC | PRN
  Administered 2020-01-30: 1000 mg via TOPICAL

## 2020-01-30 SURGICAL SUPPLY — 72 items
AID PSTN UNV HD RSTRNT DISP (MISCELLANEOUS)
APL PRP STRL LF DISP 70% ISPRP (MISCELLANEOUS)
BAG SPEC THK2 15X12 ZIP CLS (MISCELLANEOUS) ×1
BAG ZIPLOCK 12X15 (MISCELLANEOUS) ×2 IMPLANT
BASEPLATE AUG FULL 24X10X2 LAT (Plate) ×1 IMPLANT
BIT DRILL FLUTED 3.0 STRL (BIT) ×1 IMPLANT
BLADE EXTENDED COATED 6.5IN (ELECTRODE) ×2 IMPLANT
BLADE SAG 18X100X1.27 (BLADE) ×2 IMPLANT
CALIBRATOR GLENOID VIP 5-D (SYSTAGENIX WOUND MANAGEMENT) ×1 IMPLANT
CHLORAPREP W/TINT 26 (MISCELLANEOUS) IMPLANT
COVER SURGICAL LIGHT HANDLE (MISCELLANEOUS) ×2 IMPLANT
COVER WAND RF STERILE (DRAPES) IMPLANT
CUP SUT UNIV REVERS 36+2 RT (Cup) ×1 IMPLANT
DECANTER SPIKE VIAL GLASS SM (MISCELLANEOUS) ×2 IMPLANT
DRAPE IMP U-DRAPE 54X76 (DRAPES) ×4 IMPLANT
DRAPE INCISE IOBAN 66X45 STRL (DRAPES) ×2 IMPLANT
DRAPE ORTHO SPLIT 77X108 STRL (DRAPES) ×4
DRAPE SURG ORHT 6 SPLT 77X108 (DRAPES) ×2 IMPLANT
DRAPE U-SHAPE 47X51 STRL (DRAPES) ×4 IMPLANT
DRSG AQUACEL AG ADV 3.5X 6 (GAUZE/BANDAGES/DRESSINGS) ×1 IMPLANT
DRSG AQUACEL AG ADV 3.5X10 (GAUZE/BANDAGES/DRESSINGS) ×2 IMPLANT
ELECT NDL TIP 2.8 STRL (NEEDLE) ×1 IMPLANT
ELECT NEEDLE TIP 2.8 STRL (NEEDLE) ×2 IMPLANT
ELECT REM PT RETURN 15FT ADLT (MISCELLANEOUS) ×2 IMPLANT
FACESHIELD WRAPAROUND (MASK) ×6 IMPLANT
FACESHIELD WRAPAROUND OR TEAM (MASK) ×3 IMPLANT
GLENOSPHERE 33+4 LAT/24 UNI RV (Joint) ×1 IMPLANT
GLOVE BIOGEL PI IND STRL 7.5 (GLOVE) ×1 IMPLANT
GLOVE BIOGEL PI IND STRL 8 (GLOVE) IMPLANT
GLOVE BIOGEL PI IND STRL 8.5 (GLOVE) ×1 IMPLANT
GLOVE BIOGEL PI INDICATOR 7.5 (GLOVE) ×1
GLOVE BIOGEL PI INDICATOR 8 (GLOVE) ×2
GLOVE BIOGEL PI INDICATOR 8.5 (GLOVE) ×1
GOWN STRL REUS W/ TWL LRG LVL3 (GOWN DISPOSABLE) ×1 IMPLANT
GOWN STRL REUS W/ TWL XL LVL3 (GOWN DISPOSABLE) ×1 IMPLANT
GOWN STRL REUS W/TWL LRG LVL3 (GOWN DISPOSABLE) ×2
GOWN STRL REUS W/TWL XL LVL3 (GOWN DISPOSABLE) ×2
INSERT HUMERAL 36 +3/33 COMBO (Joint) ×1 IMPLANT
KIT BASIN OR (CUSTOM PROCEDURE TRAY) ×2 IMPLANT
KIT TURNOVER KIT A (KITS) IMPLANT
MANIFOLD NEPTUNE II (INSTRUMENTS) ×2 IMPLANT
NDL HYPO 25X1 1.5 SAFETY (NEEDLE) ×1 IMPLANT
NEEDLE HYPO 25X1 1.5 SAFETY (NEEDLE) ×2 IMPLANT
NS IRRIG 1000ML POUR BTL (IV SOLUTION) ×2 IMPLANT
PACK SHOULDER (CUSTOM PROCEDURE TRAY) ×2 IMPLANT
PENCIL SMOKE EVACUATOR (MISCELLANEOUS) IMPLANT
PIN NITINOL TARGETER 2.8 (PIN) ×1 IMPLANT
POST MODULAR 25 (Post) ×2 IMPLANT
POST MODULAR MGS BASEPLATE 25 (Post) IMPLANT
PROTECTOR NERVE ULNAR (MISCELLANEOUS) ×2 IMPLANT
REAMER ANGLED HEAD SMALL (DRILL) ×1 IMPLANT
RESTRAINT HEAD UNIVERSAL NS (MISCELLANEOUS) IMPLANT
SCREW PERI LOCK 5.5X16 (Screw) ×2 IMPLANT
SCREW PERIPHERAL 5.5X20 LOCK (Screw) ×1 IMPLANT
SCREW PERIPHERAL NL 4.5X28 (Screw) ×1 IMPLANT
SLING ARM FOAM STRAP MED (SOFTGOODS) ×1 IMPLANT
SLING ARM IMMOBILIZER LRG (SOFTGOODS) ×2 IMPLANT
SPONGE LAP 4X18 RFD (DISPOSABLE) ×2 IMPLANT
STEM HUMERAL UNI REVERS SZ6 (Stem) ×1 IMPLANT
STRIP CLOSURE SKIN 1/2X4 (GAUZE/BANDAGES/DRESSINGS) ×2 IMPLANT
SUCTION FRAZIER HANDLE 10FR (MISCELLANEOUS) ×2
SUCTION TUBE FRAZIER 10FR DISP (MISCELLANEOUS) ×1 IMPLANT
SUT FIBERWIRE #2 38 T-5 BLUE (SUTURE) ×4
SUT MNCRL AB 4-0 PS2 18 (SUTURE) ×2 IMPLANT
SUT VIC AB 1 CT1 36 (SUTURE) ×2 IMPLANT
SUT VIC AB 2-0 CT1 27 (SUTURE) ×2
SUT VIC AB 2-0 CT1 TAPERPNT 27 (SUTURE) ×1 IMPLANT
SUTURE FIBERWR #2 38 T-5 BLUE (SUTURE) ×2 IMPLANT
SYR CONTROL 10ML LL (SYRINGE) ×2 IMPLANT
TOWEL OR 17X26 10 PK STRL BLUE (TOWEL DISPOSABLE) ×4 IMPLANT
TOWER CARTRIDGE SMART MIX (DISPOSABLE) IMPLANT
YANKAUER SUCT BULB TIP 10FT TU (MISCELLANEOUS) ×2 IMPLANT

## 2020-01-30 NOTE — Discharge Instructions (Signed)
-  Maintain your right arm in your sling for comfort and otherwise you may remove for activities of daily living or showering.  No lifting with the right arm.  -Maintain postoperative bandage until your follow-up appointment.  You may shower with this bandage in place.  If this bandage becomes saturated you may replace with a clean and dry dressing.  -Apply ice to the right shoulder for 30 minutes out of each hour that you are awake.  Do this as often as you can around-the-clock.  -For mild to moderate pain take Tylenol and Celebrex or Advil around-the-clock.  For breakthrough pain use the oxycodone as necessary.  -Return to see Dr. Stann Mainland in 2 weeks for routine postoperative care.

## 2020-01-30 NOTE — Evaluation (Signed)
Occupational Therapy Evaluation Patient Details Name: [name] MRN: [id number] DOB: February 21, 1934 Today's Date: 01/30/2020    History of Present Illness Patient is an 86 year old woman s/p R reverse shoulder arthroplasty.   Clinical Impression   [name] is an 86 year old woman s/p shoulder replacement without functional use of right dominant upper extremity secondary to effects of surgery and interscalene block and shoulder precautions. Therapist provided education and instruction to patient and son in regards to exercises, precautions, positioning, donning upper extremity clothing and bathing while maintaining shoulder precautions, ice for edema management and donning/doffing sling. Patient and son verbalized understanding and demonstrated as needed. Patient needed assistance to donn shirt, underwear, pants, and shoes and provided with instruction on compensatory strategies to perform ADLs. Patient to follow up with MD for further therapy needs.      Follow Up Recommendations  Follow surgeon's recommendation for DC plan and follow-up therapies    Equipment Recommendations  None recommended by OT    Recommendations for Other Services       Precautions / Restrictions Precautions Precautions: Shoulder Shoulder Interventions: Shoulder sling/immobilizer;Off for dressing/bathing/exercises;At all times Required Braces or Orthoses: Sling Restrictions Weight Bearing Restrictions: Yes RUE Weight Bearing: Non weight bearing      Mobility Bed Mobility                  Transfers Overall transfer level: Needs assistance   Transfers: Sit to/from Stand Sit to Stand: Min guard              Balance Overall balance assessment: No apparent balance deficits (not formally assessed)                                         ADL either performed or assessed with clinical judgement   ADL Overall ADL's : Needs assistance/impaired Eating/Feeding: Set  up   Grooming: Independent   Upper Body Bathing: Minimal assistance;Sitting   Lower Body Bathing: Sit to/from stand;Supervison/ safety   Upper Body Dressing : Set up Upper Body Dressing Details (indicate cue type and reason): Patient demonstrated ability to donn t shirt Lower Body Dressing: Minimal assistance;Set up Lower Body Dressing Details (indicate cue type and reason): Patient needed assistance to get underwear over right hip, able to donn pants and needed assistance with heels of slip on shoes. Toilet Transfer: Designer, fashion/clothing and Hygiene: Min guard               Vision   Vision Assessment?: No apparent visual deficits     Perception     Praxis      Pertinent Vitals/Pain Pain Assessment: No/denies pain     Hand Dominance     Extremity/Trunk Assessment Upper Extremity Assessment Upper Extremity Assessment: RUE deficits/detail RUE Deficits / Details: No active movement yet secondary to interscalene block.       Cervical / Trunk Assessment Cervical / Trunk Assessment: Normal   Communication     Cognition Arousal/Alertness: Awake/alert Behavior During Therapy: WFL for tasks assessed/performed Overall Cognitive Status: Within Functional Limits for tasks assessed                                     General Comments       Exercises  Shoulder Instructions Shoulder Instructions Donning/doffing shirt without moving shoulder: Set-up;Patient able to independently direct caregiver;Caregiver independent with task Method for sponge bathing under operated UE: Patient able to independently direct caregiver;Caregiver independent with task Donning/doffing sling/immobilizer: Patient able to independently direct caregiver;Caregiver independent with task Correct positioning of sling/immobilizer: Patient able to independently direct caregiver;Caregiver independent with task ROM for elbow, wrist and digits of operated UE:  Patient able to independently direct caregiver;Caregiver independent with task Sling wearing schedule (on at all times/off for ADL's): Patient able to independently direct caregiver;Caregiver independent with task Proper positioning of operated UE when showering: Patient able to independently direct caregiver;Caregiver independent with task Dressing change: Caregiver independent with task;Patient able to independently direct caregiver Positioning of UE while sleeping: Patient able to independently direct caregiver;Caregiver independent with task    Home Living Family/patient expects to be discharged to:: Private residence Living Arrangements: Alone (Patient's daughter will be with patient for 4 days. daughter lives right behind her. Son will also be assisting.)                                      Prior Functioning/Environment                   OT Problem List: Decreased strength;Decreased range of motion;Impaired UE functional use      OT Treatment/Interventions:      OT Goals(Current goals can be found in the care plan section) Acute Rehab OT Goals OT Goal Formulation: All assessment and education complete, DC therapy  OT Frequency:     Barriers to D/C:            Co-evaluation              AM-PAC OT "6 Clicks" Daily Activity     Outcome Measure Help from another person eating meals?: A Little Help from another person taking care of personal grooming?: None Help from another person toileting, which includes using toliet, bedpan, or urinal?: A Little Help from another person bathing (including washing, rinsing, drying)?: A Little Help from another person to put on and taking off regular upper body clothing?: A Little Help from another person to put on and taking off regular lower body clothing?: A Little 6 Click Score: 19   End of Session Nurse Communication:  (OT education complete)  Activity Tolerance: Patient tolerated treatment well Patient  left: in chair  OT Visit Diagnosis: Muscle weakness (generalized) (M62.81)                Time: 4742-5956 OT Time Calculation (min): 31 min Charges:  OT General Charges $OT Visit: 1 Visit OT Evaluation $OT Eval Low Complexity: 1 Low OT Treatments $Self Care/Home Management : 8-22 mins  Tysin Salada, OTR/L [place]  Office [phone] Pager: [phone]   Lenward Chancellor 01/30/2020, 5:02 PM

## 2020-01-30 NOTE — H&P (Signed)
ORTHOPAEDIC H and P  REQUESTING PHYSICIAN: Nicholes Stairs, MD  PCP:  Burnard Bunting, MD  Chief Complaint: Right shoulder rotator cuff arthropathy  HPI: [name] is a 86 y.o. female who complains of worsening right shoulder pain and function.  She is been noted to have a large full-thickness tear of the superior rotator cuff.  She also has some mild fatty atrophy.  She is indicated for a reverse shoulder arthroplasty.  She is here today for that surgery.  No new complaints at this time.  Past Medical History:  Diagnosis Date  . Arthritis   . Complication of anesthesia    Elevated blood pressure after having last Kyphoplasty; pt stated "I was given Morphine and had to stay overnight"  . Coronary artery disease   . DI (detrusor instability)   . Fracture of vertebra    x 3  . GERD (gastroesophageal reflux disease)   . History of hiatal hernia   . Hypertension   . Hypothyroidism   . Menopausal symptoms   . MGUS (monoclonal gammopathy of unknown significance) 12/27/2017   IgA  . Osteoporosis   . Peripheral neuropathy 12/27/2017  . Peripheral neuropathy   . Pneumonia   . Restless leg syndrome   . Varicose veins    Past Surgical History:  Procedure Laterality Date  . ABDOMINAL HYSTERECTOMY  1992   TAH,BSO  . BLADDER SUSPENSION  2011   CRYOMESH  . CARPAL TUNNEL RELEASE Bilateral 1982  . CHOLECYSTECTOMY  1992  . COLONOSCOPY    . EYE SURGERY Bilateral    Cataract removal  . KYPHOPLASTY     X 3  . LUMBAR LAMINECTOMY WITH COFLEX 1 LEVEL N/A 12/29/2015   Procedure: L3-4 Laminectomy with Coflex;  Surgeon: Kristeen Miss, MD;  Location: [facility];  Service: Neurosurgery;  Laterality: N/A;  L3-4 Laminectomy with coflex  . OOPHORECTOMY     BSO  . REPLACEMENT TOTAL KNEE  2008,  2011   RIGHT 2008, LEFT 2011  . SPINAL FUSION  2011  . VERTEBRAL SURGERY     T-8, T-10. T-12  DR Roselee Culver   Social History   Socioeconomic History  . Marital status: Widowed    Spouse  name: Not on file  . Number of children: 2  . Years of education: 33  . Highest education level: Not on file  Occupational History  . Occupation: Retired  Tobacco Use  . Smoking status: Never Smoker  . Smokeless tobacco: Never Used  Vaping Use  . Vaping Use: Never used  Substance and Sexual Activity  . Alcohol use: No  . Drug use: No  . Sexual activity: Yes    Birth control/protection: Surgical    Comment: Hysterectomy  Other Topics Concern  . Not on file  Social History Narrative   Lives  alone   Caffeine use: decaf only   Right handed    Social Determinants of Health   Financial Resource Strain:   . Difficulty of Paying Living Expenses: Not on file  Food Insecurity:   . Worried About Charity fundraiser in the Last Year: Not on file  . Ran Out of Food in the Last Year: Not on file  Transportation Needs:   . Lack of Transportation (Medical): Not on file  . Lack of Transportation (Non-Medical): Not on file  Physical Activity:   . Days of Exercise per Week: Not on file  . Minutes of Exercise per Session: Not on file  Stress:   .  Feeling of Stress : Not on file  Social Connections:   . Frequency of Communication with Friends and Family: Not on file  . Frequency of Social Gatherings with Friends and Family: Not on file  . Attends Religious Services: Not on file  . Active Member of Clubs or Organizations: Not on file  . Attends Archivist Meetings: Not on file  . Marital Status: Not on file   Family History  Problem Relation Age of Onset  . Heart disease Mother   . Heart disease Father   . Stroke Father   . Hypertension Sister   . Diabetes Sister   . Cancer Sister        OVARIAN CA  . Hypertension Brother   . Heart disease Brother   . Diabetes Sister   . Cancer Sister        Colon  . Heart disease Brother   . Hypertension Brother    No Known Allergies Prior to Admission medications   Medication Sig Start Date End Date Taking? Authorizing  Provider  ALPRAZolam Duanne Moron) 0.5 MG tablet Take 0.5 mg by mouth 2 (two) times daily as needed (restless legs).   Yes [provider]  amLODipine (NORVASC) 5 MG tablet Take 2.5 mg by mouth daily.    Yes [provider]  aspirin 81 MG tablet Take 81 mg by mouth daily.   Yes [provider]  calcium carbonate (OS-CAL - DOSED IN MG OF ELEMENTAL CALCIUM) 1250 MG tablet Take 1 tablet by mouth daily.     Yes [provider]  celecoxib (CELEBREX) 200 MG capsule Take 200 mg by mouth daily as needed for moderate pain.  12/19/19  Yes [provider]  clidinium-chlordiazePOXIDE (LIBRAX) 5-2.5 MG capsule Take 1 capsule by mouth daily as needed (irritable bowel syndrome).    Yes [provider]  dexlansoprazole (DEXILANT) 60 MG capsule Take 60 mg by mouth daily.   Yes [provider]  escitalopram (LEXAPRO) 10 MG tablet Take 10 mg by mouth daily.   Yes [provider]  levothyroxine (SYNTHROID, LEVOTHROID) 25 MCG tablet Take 25 mcg by mouth daily before breakfast.    Yes [provider]  MAGNESIUM PO Take 1 tablet by mouth daily.   Yes [provider]  metoprolol succinate (TOPROL-XL) 25 MG 24 hr tablet Take 25 mg by mouth daily.   Yes [provider]  Multiple Vitamin (MULTIVITAMIN) tablet Take 1 tablet by mouth daily.     Yes [provider]  rosuvastatin (CRESTOR) 5 MG tablet Take 5 mg by mouth once a week.   Yes [provider]  triamterene-hydrochlorothiazide (MAXZIDE-25) 37.5-25 MG per tablet Take 1 tablet by mouth daily.     Yes [provider]  vitamin C (ASCORBIC ACID) 500 MG tablet Take 1,000 mg by mouth daily.    Yes [provider]  Vitamin D, Ergocalciferol, (DRISDOL) 50000 UNITS CAPS Take 50,000 Units by mouth every 7 (seven) days.    Yes [provider]  zolpidem (AMBIEN CR) 12.5 MG CR tablet Take 12.5 mg by mouth at bedtime.   Yes [provider]     No results found.  Positive ROS: All other systems have been reviewed and were otherwise negative with the exception of those mentioned in the HPI and as above.  Physical Exam: General: Alert, no acute distress Cardiovascular: No pedal edema Respiratory: No cyanosis, no use of accessory musculature GI: No organomegaly, abdomen is soft and non-tender Skin:  No lesions in the area of chief complaint Neurologic: Sensation intact distally Psychiatric: Patient is competent for consent with normal mood and affect Lymphatic: No axillary or cervical lymphadenopathy  MUSCULOSKELETAL: Right upper extremity is clean, dry, and intact.  No open wounds or lesions.  Neurovascularly intact.  Assessment: Right shoulder rotator cuff arthropathy  Plan: -Our plan is to proceed today with a reverse shoulder arthroplasty.  We again reviewed the risk and benefits of this procedure at length.  We discussed the risk of bleeding, infection, damage to surrounding structures, fracture, dislocation, stiffness, need for revision surgery, and the risk of anesthesia.  She has provided informed consent.  -Our plan will tentatively be for recovery postoperatively here in the PACU and then discharged home.  Certainly reserve the right to keep her overnight if need be.    Nicholes Stairs, MD Cell [phone]    01/30/2020 10:16 AM

## 2020-01-30 NOTE — Anesthesia Postprocedure Evaluation (Signed)
Anesthesia Post Note  Patient: [name]  Procedure(s) Performed: REVERSE SHOULDER ARTHROPLASTY (Right Shoulder)     Patient location during evaluation: PACU Anesthesia Type: General Level of consciousness: awake and alert Pain management: pain level controlled Vital Signs Assessment: post-procedure vital signs reviewed and stable Respiratory status: spontaneous breathing, nonlabored ventilation, respiratory function stable and patient connected to nasal cannula oxygen Cardiovascular status: blood pressure returned to baseline and stable Postop Assessment: no apparent nausea or vomiting Anesthetic complications: no   No complications documented.  Last Vitals:  Vitals:   01/30/20 1245 01/30/20 1300  BP: (!) 161/89 (!) 155/89  Pulse: 80 82  Resp: 20 16  Temp:    SpO2: 96% (!) 89%    Last Pain:  Vitals:   01/30/20 1300  TempSrc:   PainSc: 0-No pain                 Kylei Purington S

## 2020-01-30 NOTE — Op Note (Signed)
01/30/2020  12:18 PM  PATIENT:  [name]    PRE-OPERATIVE DIAGNOSIS:  Right shoulder rotator cuff arthropathy  POST-OPERATIVE DIAGNOSIS:  Same  PROCEDURE:  REVERSE SHOULDER ARTHROPLASTY  SURGEON:  Nicholes Stairs, MD  ASSISTANT: Jonelle Sidle, PA-C.  Attestation for assistant:  PA Mcclung was necessary throughout the case to help with positioning the patient, placement of deep retractors, placement of final implants as well as trialing.  He was also utilized for safe closure of the wound and transported back to the recovery room.  ANESTHESIA:   General  ESTIMATED BLOOD LOSS: 50 cc  PREOPERATIVE INDICATIONS:  [name] is a  86 y.o. female with a diagnosis of Right shoulder rotator cuff arthropathy who failed conservative measures and elected for surgical management.    The risks benefits and alternatives were discussed with the patient preoperatively including but not limited to the risks of infection, bleeding, nerve injury, cardiopulmonary complications, the need for revision surgery, dislocation, brachial plexus palsy, incomplete relief of pain, among others, and the patient was willing to proceed.  OPERATIVE IMPLANTS:  Arthrex reverse system. Size 6 humeral stem Full superior augment for the glenoid baseplate with +2 mm offset.  With a 25 mm central post Inferior 28 mm nonlocking screw on baseplate.  Otherwise 55mm locking screws x3 33+4 mm glenosphere Standard +3 polyethylene on the humeral side.  OPERATIVE FINDINGS:  Large and retracted superior rotator cuff tear of the supraspinatus.  The teres minor and posterior aspect of the infraspinatus were intact.  Subscapularis was not torn.  There was osteoporotic bone noted as well.  The long head of the biceps had spontaneously ruptured and the stump was found in the proximal biceps groove.  OPERATIVE PROCEDURE: The patient was brought to the operating room and placed in the supine position. General anesthesia  was administered. IV antibiotics were given. A Foley was placed. Time out was performed. The upper extremity was prepped and draped in usual sterile fashion. The patient was in a beachchair position. Deltopectoral approach was carried out. The biceps was tenodesed to the pectoralis tendon with #2 Fiberwire. The subscapularis was released off of the bone.   I then performed circumferential releases of the humerus, and then dislocated the head, and then reamed with the reamer to the above named size.  I then applied the jig, and cut the humeral head in 30 of retroversion, and then turned my attention to the glenoid.  Deep retractors were placed, and I resected the labrum, and then placed a guidepin into the center position on the glenoid, with slight inferior inclination. I then reamed over the guidepin, and this created a small metaphyseal cancellus blush inferiorly, removing just the cartilage to the subchondral bone superiorly.  Based on her preoperative CT scan we elected to use a full superior wedge due to superior bone wear and loss.  We placed this just in line with the base of the coracoid per the preoperative scan.  We used the wedge reamer for the full wedge.  We then used the VIP central pin guide to drill our guidepin.  This was in the middle of the vault and drilled to a depth of 30 mm.  We then drilled for a 25 mm central post for the guide.  The base plate with the full wedge placed in the superior position with the 25 mm central post was placed by hand.  This had excellent purchase.  We then placed 1 inferior nonlocking screw at 28  mm of length.  And then 3 locking peripheral screws at 16 mm length.  I then turned my attention to the glenosphere, and impacted this into place.  The glenoid sphere was completely seated, and had engagement of the [facility] taper. I then turned my attention back to the humerus.  I sequentially broached, and then trialed, and was found to restore soft tissue  tension, and it had 2 finger tightness. Therefore the above named components were selected. The shoulder felt stable throughout functional motion.  Before I placed the real prosthesis I had also placed a total of 3 #2 FiberWire through the suture cup pilot holes for subscapularis repair.   I then impacted the real prosthesis into place, as well as the real humeral tray, and reduced the shoulder. The shoulder had excellent motion, and was stable, and I irrigated the wounds copiously.  1 g of vancomycin powder was placed into the deltopectoral interval.   I then used these to repair the subscapularis. This came down to bone.  I then irrigated the shoulder copiously once more, repaired the deltopectoral interval with #2 FiberWire followed by subcutaneous Vicryl, then monocryl for the skin,  with Steri-Strips and sterile gauze for the skin. The patient was awakened and returned back in stable and satisfactory condition. There no complications and they tolerated the procedure well.  All counts were correct x2.  Disposition:  [name] will be nonweightbearing and in her sling for the first 2 weeks.  She may remove it to do ADLs and also to shower.  She can begin showering in 2 days.  She will begin active motion through the shoulder and 2 weeks postoperatively.  Passive motion at the shoulder is allowed as tolerated.  Otherwise she can do active motion of the hand and wrist and elbow beginning immediately.  She will keep her postoperative bandage in place until her follow-up appointment.  She will discharge home from PACU as long as she maintains stable vital signs.

## 2020-01-30 NOTE — Anesthesia Procedure Notes (Signed)
Procedure Name: Intubation Date/Time: 01/30/2020 11:46 AM Performed by: Eben Burow, CRNA Pre-anesthesia Checklist: Patient identified, Emergency Drugs available, Suction available, Patient being monitored and Timeout performed Patient Re-evaluated:Patient Re-evaluated prior to induction Oxygen Delivery Method: Circle system utilized Preoxygenation: Pre-oxygenation with 100% oxygen Induction Type: IV induction, Rapid sequence and Cricoid Pressure applied Laryngoscope Size: Mac and 4 Grade View: Grade I Tube type: Oral Tube size: 7.0 mm Number of attempts: 1 Airway Equipment and Method: Stylet Placement Confirmation: ETT inserted through vocal cords under direct vision,  positive ETCO2 and breath sounds checked- equal and bilateral Secured at: 21 cm Tube secured with: Tape Dental Injury: Teeth and Oropharynx as per pre-operative assessment

## 2020-01-30 NOTE — Anesthesia Procedure Notes (Signed)
Anesthesia Procedure Image    

## 2020-01-30 NOTE — Anesthesia Preprocedure Evaluation (Signed)
Anesthesia Evaluation  Patient identified by MRN, date of birth, ID band Patient awake    Reviewed: Allergy & Precautions, NPO status , Patient's Chart, lab work & pertinent test results  Airway Mallampati: II  TM Distance: >3 FB Neck ROM: Limited    Dental no notable dental hx.    Pulmonary neg pulmonary ROS,    Pulmonary exam normal breath sounds clear to auscultation       Cardiovascular hypertension, Normal cardiovascular exam Rhythm:Regular Rate:Normal     Neuro/Psych negative neurological ROS  negative psych ROS   GI/Hepatic Neg liver ROS, GERD  ,  Endo/Other  Hypothyroidism   Renal/GU negative Renal ROS  negative genitourinary   Musculoskeletal  (+) Arthritis , Osteoarthritis,    Abdominal   Peds negative pediatric ROS (+)  Hematology negative hematology ROS (+)   Anesthesia Other Findings   Reproductive/Obstetrics negative OB ROS                             Anesthesia Physical Anesthesia Plan  ASA: II  Anesthesia Plan: General   Post-op Pain Management:  Regional for Post-op pain   Induction: Intravenous  PONV Risk Score and Plan: 3 and Ondansetron, Dexamethasone and Treatment may vary due to age or medical condition  Airway Management Planned: Oral ETT  Additional Equipment:   Intra-op Plan:   Post-operative Plan: Extubation in OR  Informed Consent: I have reviewed the patients History and Physical, chart, labs and discussed the procedure including the risks, benefits and alternatives for the proposed anesthesia with the patient or authorized representative who has indicated his/her understanding and acceptance.     Dental advisory given  Plan Discussed with: CRNA and Surgeon  Anesthesia Plan Comments:         Anesthesia Quick Evaluation

## 2020-01-30 NOTE — Anesthesia Procedure Notes (Signed)
Anesthesia Regional Block: Interscalene brachial plexus block   Pre-Anesthetic Checklist: ,, timeout performed, Correct Patient, Correct Site, Correct Laterality, Correct Procedure, Correct Position, site marked, Risks and benefits discussed,  Surgical consent,  Pre-op evaluation,  At surgeon's request and post-op pain management  Laterality: Right  Prep: chloraprep       Needles:  Injection technique: Single-shot  Needle Type: Echogenic Needle     Needle Length: 9cm      Additional Needles:   Procedures:,,,, ultrasound used (permanent image in chart),,,,  Narrative:  Start time: 01/30/2020 9:51 AM End time: 01/30/2020 9:59 AM Injection made incrementally with aspirations every 5 mL.  Performed by: Personally  Anesthesiologist: Myrtie Soman, MD  Additional Notes: Patient tolerated the procedure well without complications

## 2020-01-30 NOTE — Transfer of Care (Signed)
Immediate Anesthesia Transfer of Care Note  Patient: [name]  Procedure(s) Performed: REVERSE SHOULDER ARTHROPLASTY (Right Shoulder)  Patient Location: PACU  Anesthesia Type:General  Level of Consciousness: awake and patient cooperative  Airway & Oxygen Therapy: Patient Spontanous Breathing and Patient connected to face mask oxygen  Post-op Assessment: Report given to RN and Post -op Vital signs reviewed and stable  Post vital signs: Reviewed and stable  Last Vitals:  Vitals Value Taken Time  BP 155/86 01/30/20 1236  Temp    Pulse 84 01/30/20 1238  Resp 18 01/30/20 1238  SpO2 100 % 01/30/20 1238  Vitals shown include unvalidated device data.  Last Pain:  Vitals:   01/30/20 0839  TempSrc:   PainSc: 7       Patients Stated Pain Goal: 3 (63/86/85 4883)  Complications: No complications documented.

## 2020-01-30 NOTE — Brief Op Note (Signed)
01/30/2020  12:11 PM  PATIENT:  [name]  86 y.o. female  PRE-OPERATIVE DIAGNOSIS:  Right shoulder rotator cuff arthropathy  POST-OPERATIVE DIAGNOSIS:  Right shoulder rotator cuff arthropathy  PROCEDURE:  Procedure(s) with comments: REVERSE SHOULDER ARTHROPLASTY (Right) - 2.5 hrs  SURGEON:  Surgeon(s) and Role:    * Stann Mainland, Elly Modena, MD - Primary  PHYSICIAN ASSISTANT: Jonelle Sidle, PA-C  ANESTHESIA:   regional and general  EBL:  50 mL   BLOOD ADMINISTERED:none  DRAINS: none   LOCAL MEDICATIONS USED:  NONE  SPECIMEN:  No Specimen  DISPOSITION OF SPECIMEN:  N/A  COUNTS:  YES  TOURNIQUET:  * No tourniquets in log *  DICTATION: .Note written in EPIC  PLAN OF CARE: Discharge to home after PACU  PATIENT DISPOSITION:  PACU - hemodynamically stable.   Delay start of Pharmacological VTE agent (>24hrs) due to surgical blood loss or risk of bleeding: not applicable

## 2020-01-30 NOTE — Progress Notes (Signed)
Assisted Dr. Rose with right, ultrasound guided, interscalene  block. Side rails up, monitors on throughout procedure. See vital signs in flow sheet. Tolerated Procedure well. 

## 2020-01-31 ENCOUNTER — Encounter (HOSPITAL_COMMUNITY): Payer: Self-pay | Admitting: Orthopedic Surgery

## 2020-02-12 DIAGNOSIS — Z4789 Encounter for other orthopedic aftercare: Secondary | ICD-10-CM | POA: Diagnosis not present

## 2020-02-16 DIAGNOSIS — Z471 Aftercare following joint replacement surgery: Secondary | ICD-10-CM | POA: Diagnosis not present

## 2020-02-16 DIAGNOSIS — I10 Essential (primary) hypertension: Secondary | ICD-10-CM | POA: Diagnosis not present

## 2020-02-16 DIAGNOSIS — K440 Diaphragmatic hernia with obstruction, without gangrene: Secondary | ICD-10-CM | POA: Diagnosis not present

## 2020-02-16 DIAGNOSIS — I2510 Atherosclerotic heart disease of native coronary artery without angina pectoris: Secondary | ICD-10-CM | POA: Diagnosis not present

## 2020-02-16 DIAGNOSIS — Z96653 Presence of artificial knee joint, bilateral: Secondary | ICD-10-CM | POA: Diagnosis not present

## 2020-02-16 DIAGNOSIS — Z8701 Personal history of pneumonia (recurrent): Secondary | ICD-10-CM | POA: Diagnosis not present

## 2020-02-16 DIAGNOSIS — K219 Gastro-esophageal reflux disease without esophagitis: Secondary | ICD-10-CM | POA: Diagnosis not present

## 2020-02-16 DIAGNOSIS — Z791 Long term (current) use of non-steroidal anti-inflammatories (NSAID): Secondary | ICD-10-CM | POA: Diagnosis not present

## 2020-02-16 DIAGNOSIS — G629 Polyneuropathy, unspecified: Secondary | ICD-10-CM | POA: Diagnosis not present

## 2020-02-16 DIAGNOSIS — Z96611 Presence of right artificial shoulder joint: Secondary | ICD-10-CM | POA: Diagnosis not present

## 2020-02-16 DIAGNOSIS — M810 Age-related osteoporosis without current pathological fracture: Secondary | ICD-10-CM | POA: Diagnosis not present

## 2020-02-16 DIAGNOSIS — Z7982 Long term (current) use of aspirin: Secondary | ICD-10-CM | POA: Diagnosis not present

## 2020-02-16 DIAGNOSIS — I8390 Asymptomatic varicose veins of unspecified lower extremity: Secondary | ICD-10-CM | POA: Diagnosis not present

## 2020-02-16 DIAGNOSIS — E039 Hypothyroidism, unspecified: Secondary | ICD-10-CM | POA: Diagnosis not present

## 2020-02-16 DIAGNOSIS — G2581 Restless legs syndrome: Secondary | ICD-10-CM | POA: Diagnosis not present

## 2020-02-18 DIAGNOSIS — Z471 Aftercare following joint replacement surgery: Secondary | ICD-10-CM | POA: Diagnosis not present

## 2020-02-18 DIAGNOSIS — I10 Essential (primary) hypertension: Secondary | ICD-10-CM | POA: Diagnosis not present

## 2020-02-18 DIAGNOSIS — G2581 Restless legs syndrome: Secondary | ICD-10-CM | POA: Diagnosis not present

## 2020-02-18 DIAGNOSIS — G629 Polyneuropathy, unspecified: Secondary | ICD-10-CM | POA: Diagnosis not present

## 2020-02-18 DIAGNOSIS — M810 Age-related osteoporosis without current pathological fracture: Secondary | ICD-10-CM | POA: Diagnosis not present

## 2020-02-18 DIAGNOSIS — I2510 Atherosclerotic heart disease of native coronary artery without angina pectoris: Secondary | ICD-10-CM | POA: Diagnosis not present

## 2020-02-24 DIAGNOSIS — I2510 Atherosclerotic heart disease of native coronary artery without angina pectoris: Secondary | ICD-10-CM | POA: Diagnosis not present

## 2020-02-24 DIAGNOSIS — G629 Polyneuropathy, unspecified: Secondary | ICD-10-CM | POA: Diagnosis not present

## 2020-02-24 DIAGNOSIS — M810 Age-related osteoporosis without current pathological fracture: Secondary | ICD-10-CM | POA: Diagnosis not present

## 2020-02-24 DIAGNOSIS — Z471 Aftercare following joint replacement surgery: Secondary | ICD-10-CM | POA: Diagnosis not present

## 2020-02-24 DIAGNOSIS — I10 Essential (primary) hypertension: Secondary | ICD-10-CM | POA: Diagnosis not present

## 2020-02-24 DIAGNOSIS — G2581 Restless legs syndrome: Secondary | ICD-10-CM | POA: Diagnosis not present

## 2020-02-27 DIAGNOSIS — I2510 Atherosclerotic heart disease of native coronary artery without angina pectoris: Secondary | ICD-10-CM | POA: Diagnosis not present

## 2020-02-27 DIAGNOSIS — G2581 Restless legs syndrome: Secondary | ICD-10-CM | POA: Diagnosis not present

## 2020-02-27 DIAGNOSIS — M810 Age-related osteoporosis without current pathological fracture: Secondary | ICD-10-CM | POA: Diagnosis not present

## 2020-02-27 DIAGNOSIS — I10 Essential (primary) hypertension: Secondary | ICD-10-CM | POA: Diagnosis not present

## 2020-02-27 DIAGNOSIS — Z471 Aftercare following joint replacement surgery: Secondary | ICD-10-CM | POA: Diagnosis not present

## 2020-02-27 DIAGNOSIS — G629 Polyneuropathy, unspecified: Secondary | ICD-10-CM | POA: Diagnosis not present

## 2020-03-03 DIAGNOSIS — I2510 Atherosclerotic heart disease of native coronary artery without angina pectoris: Secondary | ICD-10-CM | POA: Diagnosis not present

## 2020-03-03 DIAGNOSIS — M810 Age-related osteoporosis without current pathological fracture: Secondary | ICD-10-CM | POA: Diagnosis not present

## 2020-03-03 DIAGNOSIS — Z471 Aftercare following joint replacement surgery: Secondary | ICD-10-CM | POA: Diagnosis not present

## 2020-03-03 DIAGNOSIS — G2581 Restless legs syndrome: Secondary | ICD-10-CM | POA: Diagnosis not present

## 2020-03-03 DIAGNOSIS — G629 Polyneuropathy, unspecified: Secondary | ICD-10-CM | POA: Diagnosis not present

## 2020-03-03 DIAGNOSIS — I10 Essential (primary) hypertension: Secondary | ICD-10-CM | POA: Diagnosis not present

## 2020-03-05 DIAGNOSIS — Z471 Aftercare following joint replacement surgery: Secondary | ICD-10-CM | POA: Diagnosis not present

## 2020-03-05 DIAGNOSIS — Z23 Encounter for immunization: Secondary | ICD-10-CM | POA: Diagnosis not present

## 2020-03-05 DIAGNOSIS — G629 Polyneuropathy, unspecified: Secondary | ICD-10-CM | POA: Diagnosis not present

## 2020-03-05 DIAGNOSIS — I2510 Atherosclerotic heart disease of native coronary artery without angina pectoris: Secondary | ICD-10-CM | POA: Diagnosis not present

## 2020-03-05 DIAGNOSIS — M810 Age-related osteoporosis without current pathological fracture: Secondary | ICD-10-CM | POA: Diagnosis not present

## 2020-03-05 DIAGNOSIS — G2581 Restless legs syndrome: Secondary | ICD-10-CM | POA: Diagnosis not present

## 2020-03-05 DIAGNOSIS — I10 Essential (primary) hypertension: Secondary | ICD-10-CM | POA: Diagnosis not present

## 2020-03-06 ENCOUNTER — Encounter: Payer: Self-pay | Admitting: Hematology & Oncology

## 2020-03-06 ENCOUNTER — Inpatient Hospital Stay (HOSPITAL_BASED_OUTPATIENT_CLINIC_OR_DEPARTMENT_OTHER): Payer: Medicare Other | Admitting: Hematology & Oncology

## 2020-03-06 ENCOUNTER — Inpatient Hospital Stay: Payer: Medicare Other | Attending: Hematology & Oncology

## 2020-03-06 ENCOUNTER — Other Ambulatory Visit: Payer: Self-pay

## 2020-03-06 VITALS — BP 134/83 | HR 91 | Temp 98.0°F | Resp 18 | Wt 138.0 lb

## 2020-03-06 DIAGNOSIS — Z79899 Other long term (current) drug therapy: Secondary | ICD-10-CM | POA: Diagnosis not present

## 2020-03-06 DIAGNOSIS — M791 Myalgia, unspecified site: Secondary | ICD-10-CM | POA: Insufficient documentation

## 2020-03-06 DIAGNOSIS — I2510 Atherosclerotic heart disease of native coronary artery without angina pectoris: Secondary | ICD-10-CM | POA: Diagnosis not present

## 2020-03-06 DIAGNOSIS — E782 Mixed hyperlipidemia: Secondary | ICD-10-CM | POA: Diagnosis not present

## 2020-03-06 DIAGNOSIS — D472 Monoclonal gammopathy: Secondary | ICD-10-CM

## 2020-03-06 LAB — CBC WITH DIFFERENTIAL (CANCER CENTER ONLY)
Abs Immature Granulocytes: 0.01 10*3/uL (ref 0.00–0.07)
Basophils Absolute: 0 10*3/uL (ref 0.0–0.1)
Basophils Relative: 0 %
Eosinophils Absolute: 0.1 10*3/uL (ref 0.0–0.5)
Eosinophils Relative: 2 %
HCT: 41.1 % (ref 36.0–46.0)
Hemoglobin: 13.6 g/dL (ref 12.0–15.0)
Immature Granulocytes: 0 %
Lymphocytes Relative: 17 %
Lymphs Abs: 0.9 10*3/uL (ref 0.7–4.0)
MCH: 32.2 pg (ref 26.0–34.0)
MCHC: 33.1 g/dL (ref 30.0–36.0)
MCV: 97.2 fL (ref 80.0–100.0)
Monocytes Absolute: 0.7 10*3/uL (ref 0.1–1.0)
Monocytes Relative: 13 %
Neutro Abs: 3.5 10*3/uL (ref 1.7–7.7)
Neutrophils Relative %: 68 %
Platelet Count: 299 10*3/uL (ref 150–400)
RBC: 4.23 MIL/uL (ref 3.87–5.11)
RDW: 12.3 % (ref 11.5–15.5)
WBC Count: 5.2 10*3/uL (ref 4.0–10.5)
nRBC: 0 % (ref 0.0–0.2)

## 2020-03-06 LAB — CMP (CANCER CENTER ONLY)
ALT: 15 U/L (ref 0–44)
AST: 24 U/L (ref 15–41)
Albumin: 4.4 g/dL (ref 3.5–5.0)
Alkaline Phosphatase: 77 U/L (ref 38–126)
Anion gap: 8 (ref 5–15)
BUN: 22 mg/dL (ref 8–23)
CO2: 31 mmol/L (ref 22–32)
Calcium: 10.3 mg/dL (ref 8.9–10.3)
Chloride: 95 mmol/L — ABNORMAL LOW (ref 98–111)
Creatinine: 0.87 mg/dL (ref 0.44–1.00)
GFR, Estimated: >60 mL/min (ref >60)
Glucose, Bld: 85 mg/dL (ref 70–99)
Potassium: 4.1 mmol/L (ref 3.5–5.1)
Sodium: 134 mmol/L — ABNORMAL LOW (ref 135–145)
Total Bilirubin: 0.6 mg/dL (ref 0.3–1.2)
Total Protein: 7.1 g/dL (ref 6.5–8.1)

## 2020-03-06 LAB — LIPID PANEL
Cholesterol: 161 mg/dL (ref 0–200)
HDL: 77 mg/dL (ref >40)
LDL Cholesterol: 64 mg/dL (ref 0–99)
Total CHOL/HDL Ratio: 2.1 RATIO
Triglycerides: 99 mg/dL (ref <150)
VLDL: 20 mg/dL (ref 0–40)

## 2020-03-06 NOTE — Progress Notes (Signed)
Hematology and Oncology Follow Up Visit  [name] [id number] 15-Mar-1934 86 y.o. 03/06/2020   Principle Diagnosis:   IgA Kappa MGUS  Current Therapy:    Observation     Interim History:  Ms. [name] is back for follow-up.  She had right shoulder surgery about 5 weeks ago.  She really looks good.  She has more range of motion and then I would have thought in the shoulder already.  She has had a pretty decent summer.  When we last saw her back in July, her M spike was 0.2 g/dL.  Her IgA level was 395 mg/dL.  Her kappa light chain was 2.6 mg/dL.  She has had her coronavirus vaccines.  She was wondering about the booster shot.  I told her that I thought the booster shot would be okay.  She has had no problems with fever.  She has had no bleeding.  She has had no change in bowel or bladder habits.  There is no cough.  She has had no shortness of breath.  She cannot wait until she can work out in the yard again.  Hopefully she will be able to do this after she finishes the shoulder rehab.    Overall, her performance status is ECOG 1.  Medications:  Current Outpatient Medications:  .  ALPRAZolam (XANAX) 0.5 MG tablet, Take 0.5 mg by mouth 2 (two) times daily as needed (restless legs)., Disp: , Rfl:  .  amLODipine (NORVASC) 5 MG tablet, Take 2.5 mg by mouth daily. , Disp: , Rfl:  .  Aspirin Buf,CaCarb-MgCarb-MgO, 81 MG TABS, Take 81 mg by mouth daily., Disp: , Rfl:  .  calcium carbonate (OS-CAL - DOSED IN MG OF ELEMENTAL CALCIUM) 1250 MG tablet, Take 1 tablet by mouth daily.  , Disp: , Rfl:  .  celecoxib (CELEBREX) 200 MG capsule, Take 200 mg by mouth daily as needed for moderate pain. , Disp: , Rfl:  .  clidinium-chlordiazePOXIDE (LIBRAX) 5-2.5 MG capsule, Take 1 capsule by mouth daily as needed (irritable bowel syndrome). , Disp: , Rfl:  .  dexlansoprazole (DEXILANT) 60 MG capsule, Take 60 mg by mouth daily., Disp: , Rfl:  .  escitalopram (LEXAPRO) 10 MG tablet, Take 10 mg by  mouth daily., Disp: , Rfl:  .  levothyroxine (SYNTHROID, LEVOTHROID) 25 MCG tablet, Take 25 mcg by mouth daily before breakfast. , Disp: , Rfl:  .  MAGNESIUM PO, Take 1 tablet by mouth daily., Disp: , Rfl:  .  metoprolol succinate (TOPROL-XL) 25 MG 24 hr tablet, Take 25 mg by mouth daily., Disp: , Rfl:  .  Multiple Vitamin (MULTIVITAMIN) tablet, Take 1 tablet by mouth daily.  , Disp: , Rfl:  .  rosuvastatin (CRESTOR) 20 MG tablet, Take 20 mg by mouth 2 (two) times daily., Disp: , Rfl:  .  triamterene-hydrochlorothiazide (MAXZIDE-25) 37.5-25 MG per tablet, Take 1 tablet by mouth daily.  , Disp: , Rfl:  .  vitamin C (ASCORBIC ACID) 500 MG tablet, Take 1,000 mg by mouth daily. , Disp: , Rfl:  .  Vitamin D, Ergocalciferol, (DRISDOL) 50000 UNITS CAPS, Take 50,000 Units by mouth every 7 (seven) days. , Disp: , Rfl:  .  zolpidem (AMBIEN CR) 12.5 MG CR tablet, Take 12.5 mg by mouth at bedtime., Disp: , Rfl:   Allergies: No Known Allergies  Past Medical History, Surgical history, Social history, and Family History were reviewed and updated.  Review of Systems: Review of Systems  Constitutional: Negative.  Negative  for appetite change, fatigue, fever and unexpected weight change.  HENT:  Negative.  Negative for lump/mass, mouth sores, sore throat and trouble swallowing.   Eyes: Negative.   Respiratory: Negative.  Negative for cough, hemoptysis and shortness of breath.   Cardiovascular: Negative.  Negative for leg swelling and palpitations.  Gastrointestinal: Negative.  Negative for abdominal distention, abdominal pain, blood in stool, constipation, diarrhea, nausea and vomiting.  Endocrine: Negative.   Genitourinary: Negative.  Negative for bladder incontinence, dysuria, frequency and hematuria.   Musculoskeletal: Positive for myalgias. Negative for arthralgias, back pain and gait problem.  Skin: Negative.  Negative for itching and rash.  Neurological: Positive for headaches. Negative for dizziness,  extremity weakness, gait problem, numbness, seizures and speech difficulty.  Hematological: Negative.  Does not bruise/bleed easily.  Psychiatric/Behavioral: Negative.  Negative for depression and sleep disturbance. The patient is not nervous/anxious.     Physical Exam:  weight is 138 lb (62.6 kg). Her oral temperature is 98 F (36.7 C). Her blood pressure is 134/83 and her pulse is 91. Her respiration is 18 and oxygen saturation is 99%.   Wt Readings from Last 3 Encounters:  03/06/20 138 lb (62.6 kg)  01/30/20 141 lb (64 kg)  01/22/20 141 lb (64 kg)    Physical Exam Vitals reviewed.  HENT:     Head: Normocephalic and atraumatic.  Eyes:     Pupils: Pupils are equal, round, and reactive to light.  Cardiovascular:     Rate and Rhythm: Normal rate and regular rhythm.     Heart sounds: Normal heart sounds.  Pulmonary:     Effort: Pulmonary effort is normal.     Breath sounds: Normal breath sounds.  Abdominal:     General: Bowel sounds are normal.     Palpations: Abdomen is soft.  Musculoskeletal:        General: No tenderness or deformity. Normal range of motion.     Cervical back: Normal range of motion.  Lymphadenopathy:     Cervical: No cervical adenopathy.  Skin:    General: Skin is warm and dry.     Findings: No erythema or rash.  Neurological:     Mental Status: She is alert and oriented to person, place, and time.  Psychiatric:        Behavior: Behavior normal.        Thought Content: Thought content normal.        Judgment: Judgment normal.      Lab Results  Component Value Date   WBC 5.2 03/06/2020   HGB 13.6 03/06/2020   HCT 41.1 03/06/2020   MCV 97.2 03/06/2020   PLT 299 03/06/2020     Chemistry      Component Value Date/Time   NA 134 (L) 03/06/2020 1323   K 4.1 03/06/2020 1323   CL 95 (L) 03/06/2020 1323   CO2 31 03/06/2020 1323   BUN 22 03/06/2020 1323   CREATININE 0.87 03/06/2020 1323      Component Value Date/Time   CALCIUM 10.3 03/06/2020  1323   ALKPHOS 77 03/06/2020 1323   AST 24 03/06/2020 1323   ALT 15 03/06/2020 1323   BILITOT 0.6 03/06/2020 1323      Impression and Plan: Ms. [name] is a 86 year old white female.  She has an IgA kappa MGUS.  This is still at a very low level.  This certainly could be from her age alone.  I we will plan to get her back now in 4 months.  Her MGUS is holding pretty steady.  It would not surprise me if the M spike is up a little bit from just having her shoulder surgery.  However, I just would not think that this would not be causing her to have problems.     [name], MD 10/8/20212:18 PM

## 2020-03-08 LAB — IGG, IGA, IGM
IgA: 428 mg/dL — ABNORMAL HIGH (ref 64–422)
IgG (Immunoglobin G), Serum: 718 mg/dL (ref 586–1602)
IgM (Immunoglobulin M), Srm: 76 mg/dL (ref 26–217)

## 2020-03-09 ENCOUNTER — Encounter: Payer: Self-pay | Admitting: *Deleted

## 2020-03-09 DIAGNOSIS — I10 Essential (primary) hypertension: Secondary | ICD-10-CM | POA: Diagnosis not present

## 2020-03-09 DIAGNOSIS — I2510 Atherosclerotic heart disease of native coronary artery without angina pectoris: Secondary | ICD-10-CM | POA: Diagnosis not present

## 2020-03-09 DIAGNOSIS — G629 Polyneuropathy, unspecified: Secondary | ICD-10-CM | POA: Diagnosis not present

## 2020-03-09 DIAGNOSIS — G2581 Restless legs syndrome: Secondary | ICD-10-CM | POA: Diagnosis not present

## 2020-03-09 DIAGNOSIS — Z471 Aftercare following joint replacement surgery: Secondary | ICD-10-CM | POA: Diagnosis not present

## 2020-03-09 DIAGNOSIS — M810 Age-related osteoporosis without current pathological fracture: Secondary | ICD-10-CM | POA: Diagnosis not present

## 2020-03-09 LAB — KAPPA/LAMBDA LIGHT CHAINS
Kappa free light chain: 31 mg/L — ABNORMAL HIGH (ref 3.3–19.4)
Kappa, lambda light chain ratio: 1.68 — ABNORMAL HIGH (ref 0.26–1.65)
Lambda free light chains: 18.4 mg/L (ref 5.7–26.3)

## 2020-03-09 LAB — PROTEIN ELECTROPHORESIS, SERUM
A/G Ratio: 1.3 (ref 0.7–1.7)
Albumin ELP: 3.9 g/dL (ref 2.9–4.4)
Alpha-1-Globulin: 0.3 g/dL (ref 0.0–0.4)
Alpha-2-Globulin: 0.8 g/dL (ref 0.4–1.0)
Beta Globulin: 1.1 g/dL (ref 0.7–1.3)
Gamma Globulin: 0.9 g/dL (ref 0.4–1.8)
Globulin, Total: 3.1 g/dL (ref 2.2–3.9)
M-Spike, %: 0.3 g/dL — ABNORMAL HIGH
Total Protein ELP: 7 g/dL (ref 6.0–8.5)

## 2020-03-11 DIAGNOSIS — I10 Essential (primary) hypertension: Secondary | ICD-10-CM | POA: Diagnosis not present

## 2020-03-11 DIAGNOSIS — Z4789 Encounter for other orthopedic aftercare: Secondary | ICD-10-CM | POA: Diagnosis not present

## 2020-03-11 DIAGNOSIS — Z471 Aftercare following joint replacement surgery: Secondary | ICD-10-CM | POA: Diagnosis not present

## 2020-03-11 DIAGNOSIS — G629 Polyneuropathy, unspecified: Secondary | ICD-10-CM | POA: Diagnosis not present

## 2020-03-11 DIAGNOSIS — I2510 Atherosclerotic heart disease of native coronary artery without angina pectoris: Secondary | ICD-10-CM | POA: Diagnosis not present

## 2020-03-11 DIAGNOSIS — G2581 Restless legs syndrome: Secondary | ICD-10-CM | POA: Diagnosis not present

## 2020-03-11 DIAGNOSIS — M810 Age-related osteoporosis without current pathological fracture: Secondary | ICD-10-CM | POA: Diagnosis not present

## 2020-03-12 ENCOUNTER — Encounter: Payer: Self-pay | Admitting: *Deleted

## 2020-03-17 DIAGNOSIS — Z471 Aftercare following joint replacement surgery: Secondary | ICD-10-CM | POA: Diagnosis not present

## 2020-03-19 DIAGNOSIS — R971 Elevated cancer antigen 125 [CA 125]: Secondary | ICD-10-CM | POA: Diagnosis not present

## 2020-03-19 DIAGNOSIS — Z8041 Family history of malignant neoplasm of ovary: Secondary | ICD-10-CM | POA: Diagnosis not present

## 2020-03-19 DIAGNOSIS — Z79899 Other long term (current) drug therapy: Secondary | ICD-10-CM | POA: Diagnosis not present

## 2020-03-20 DIAGNOSIS — L84 Corns and callosities: Secondary | ICD-10-CM | POA: Diagnosis not present

## 2020-03-20 DIAGNOSIS — M79671 Pain in right foot: Secondary | ICD-10-CM | POA: Diagnosis not present

## 2020-03-26 ENCOUNTER — Ambulatory Visit (INDEPENDENT_AMBULATORY_CARE_PROVIDER_SITE_OTHER): Payer: Medicare Other | Admitting: Neurology

## 2020-03-26 ENCOUNTER — Encounter: Payer: Self-pay | Admitting: Neurology

## 2020-03-26 ENCOUNTER — Other Ambulatory Visit: Payer: Self-pay

## 2020-03-26 VITALS — BP 104/62 | HR 85 | Ht 61.0 in | Wt 136.6 lb

## 2020-03-26 DIAGNOSIS — D472 Monoclonal gammopathy: Secondary | ICD-10-CM

## 2020-03-26 DIAGNOSIS — G603 Idiopathic progressive neuropathy: Secondary | ICD-10-CM | POA: Diagnosis not present

## 2020-03-26 MED ORDER — CARBAMAZEPINE 100 MG PO CHEW: CHEWABLE_TABLET | ORAL | 5 refills | Status: DC

## 2020-03-26 NOTE — Progress Notes (Signed)
I have read the note, and I agree with the clinical assessment and plan.  Madalynne Gutmann K Sherrel Shafer   

## 2020-03-26 NOTE — Progress Notes (Signed)
PATIENT: [name] DOB: 1934/04/18  REASON FOR VISIT: follow up HISTORY FROM: patient  HISTORY OF PRESENT ILLNESS: Today 03/26/20  [name] is an 86 year old female with history of an IgA MGUS, has peripheral neuropathy and associated gait disorder.  She has previously not been able to tolerate gabapentin, Lyrica, Cymbalta, or Keppra for her neuropathy.  When last seen, was placed on low-dose Effexor 37.5 mg, Dr. Ellene Route has followed her chronic low back pain, Dr. Jannifer Franklin sent for [facility] (never had done). Could not tolerate Effexor, made her head feel crazy. Continues to report burning/heat sensitivity to her feet, lower legs, is more bothersome, dry skin to legs, using coconut oil. Recently had right shoulder replacement, was doing well, but had to go back into a sling. Saw oncology recently, had a small spike of protein, felt related to shoulder surgery, follows up every 4 months. Lives alone, drives a car. Here today for follow-up unaccompanied.  HISTORY  09/24/2019 Dr.Willis: Ms. Nowotny is an 86 year old right-handed white female with a history of an IgA MGUS.  The patient has a peripheral neuropathy and associated gait disorder.  She has had a recent fall about a month ago and fractured her left hand.  She also has had a fall in the past resulting in a concussion.  She still has some mild left-sided headaches that occur about once a week, she treats this with Tylenol.  In the past she has not been able to tolerate multiple medications for her neuropathy including gabapentin, Lyrica, Cymbalta, and Keppra.  The patient has been evaluated for some memory problems but she denies any significant issues in this regard today.  MRI of the brain showed some mild white matter changes.  The patient has never undergone physical therapy for her balance.  She does not use a cane or a Fosco.  The patient returns for further evaluation.   REVIEW OF SYSTEMS: Out of a complete 14 system review of symptoms,  the patient complains only of the following symptoms, and all other reviewed systems are negative.  Paresthesia  ALLERGIES: No Known Allergies  HOME MEDICATIONS: Outpatient Medications Prior to Visit  Medication Sig Dispense Refill  . ALPRAZolam (XANAX) 0.5 MG tablet Take 0.5 mg by mouth 2 (two) times daily as needed (restless legs).    Marland Kitchen amLODipine (NORVASC) 5 MG tablet Take 2.5 mg by mouth daily.     . Aspirin Buf,CaCarb-MgCarb-MgO, 81 MG TABS Take 81 mg by mouth daily.    . calcium carbonate (OS-CAL - DOSED IN MG OF ELEMENTAL CALCIUM) 1250 MG tablet Take 1 tablet by mouth daily.      . celecoxib (CELEBREX) 200 MG capsule Take 200 mg by mouth daily as needed for moderate pain.     Marland Kitchen dexlansoprazole (DEXILANT) 60 MG capsule Take 60 mg by mouth daily.    Marland Kitchen escitalopram (LEXAPRO) 10 MG tablet Take 10 mg by mouth daily.    Marland Kitchen levothyroxine (SYNTHROID, LEVOTHROID) 25 MCG tablet Take 25 mcg by mouth daily before breakfast.     . MAGNESIUM PO Take 1 tablet by mouth daily.    . metoprolol succinate (TOPROL-XL) 25 MG 24 hr tablet Take 25 mg by mouth daily.    . Multiple Vitamin (MULTIVITAMIN) tablet Take 1 tablet by mouth daily.      . rosuvastatin (CRESTOR) 20 MG tablet Take 20 mg by mouth 2 (two) times daily.    Marland Kitchen triamterene-hydrochlorothiazide (MAXZIDE-25) 37.5-25 MG per tablet Take 1 tablet by mouth daily.      Marland Kitchen  vitamin C (ASCORBIC ACID) 500 MG tablet Take 1,000 mg by mouth daily.     . Vitamin D, Ergocalciferol, (DRISDOL) 50000 UNITS CAPS Take 50,000 Units by mouth every 7 (seven) days.     Marland Kitchen zolpidem (AMBIEN CR) 12.5 MG CR tablet Take 12.5 mg by mouth at bedtime.    . clidinium-chlordiazePOXIDE (LIBRAX) 5-2.5 MG capsule Take 1 capsule by mouth daily as needed (irritable bowel syndrome).      No facility-administered medications prior to visit.    PAST MEDICAL HISTORY: Past Medical History:  Diagnosis Date  . Arthritis   . Complication of anesthesia    Elevated blood pressure after  having last Kyphoplasty; pt stated "I was given Morphine and had to stay overnight"  . Coronary artery disease   . DI (detrusor instability)   . Fracture of vertebra    x 3  . GERD (gastroesophageal reflux disease)   . History of hiatal hernia   . Hypertension   . Hypothyroidism   . Menopausal symptoms   . MGUS (monoclonal gammopathy of unknown significance) 12/27/2017   IgA  . Osteoporosis   . Peripheral neuropathy 12/27/2017  . Peripheral neuropathy   . Pneumonia   . Restless leg syndrome   . Varicose veins     PAST SURGICAL HISTORY: Past Surgical History:  Procedure Laterality Date  . ABDOMINAL HYSTERECTOMY  1992   TAH,BSO  . BLADDER SUSPENSION  2011   CRYOMESH  . CARPAL TUNNEL RELEASE Bilateral 1982  . CHOLECYSTECTOMY  1992  . COLONOSCOPY    . EYE SURGERY Bilateral    Cataract removal  . KYPHOPLASTY     X 3  . LUMBAR LAMINECTOMY WITH COFLEX 1 LEVEL N/A 12/29/2015   Procedure: L3-4 Laminectomy with Coflex;  Surgeon: Kristeen Miss, MD;  Location: [facility];  Service: Neurosurgery;  Laterality: N/A;  L3-4 Laminectomy with coflex  . OOPHORECTOMY     BSO  . REPLACEMENT TOTAL KNEE  2008,  2011   RIGHT 2008, LEFT 2011  . REVERSE SHOULDER ARTHROPLASTY Right 01/30/2020   Procedure: REVERSE SHOULDER ARTHROPLASTY;  Surgeon: Nicholes Stairs, MD;  Location: [facility];  Service: Orthopedics;  Laterality: Right;  2.5 hrs  . SPINAL FUSION  2011  . VERTEBRAL SURGERY     T-8, T-10. T-12  DR Roselee Culver    FAMILY HISTORY: Family History  Problem Relation Age of Onset  . Heart disease Mother   . Heart disease Father   . Stroke Father   . Hypertension Sister   . Diabetes Sister   . Cancer Sister        OVARIAN CA  . Hypertension Brother   . Heart disease Brother   . Diabetes Sister   . Cancer Sister        Colon  . Heart disease Brother   . Hypertension Brother     SOCIAL HISTORY: Social History   Socioeconomic History  . Marital status: Widowed    Spouse name: Not on  file  . Number of children: 2  . Years of education: 67  . Highest education level: Not on file  Occupational History  . Occupation: Retired  Tobacco Use  . Smoking status: Never Smoker  . Smokeless tobacco: Never Used  Vaping Use  . Vaping Use: Never used  Substance and Sexual Activity  . Alcohol use: No  . Drug use: No  . Sexual activity: Yes    Birth control/protection: Surgical    Comment: Hysterectomy  Other Topics Concern  .  Not on file  Social History Narrative   Lives  alone   Caffeine use: decaf only   Right handed    Social Determinants of Health   Financial Resource Strain:   . Difficulty of Paying Living Expenses: Not on file  Food Insecurity:   . Worried About Charity fundraiser in the Last Year: Not on file  . Ran Out of Food in the Last Year: Not on file  Transportation Needs:   . Lack of Transportation (Medical): Not on file  . Lack of Transportation (Non-Medical): Not on file  Physical Activity:   . Days of Exercise per Week: Not on file  . Minutes of Exercise per Session: Not on file  Stress:   . Feeling of Stress : Not on file  Social Connections:   . Frequency of Communication with Friends and Family: Not on file  . Frequency of Social Gatherings with Friends and Family: Not on file  . Attends Religious Services: Not on file  . Active Member of Clubs or Organizations: Not on file  . Attends Archivist Meetings: Not on file  . Marital Status: Not on file  Intimate Partner Violence:   . Fear of Current or Ex-Partner: Not on file  . Emotionally Abused: Not on file  . Physically Abused: Not on file  . Sexually Abused: Not on file   PHYSICAL EXAM  Vitals:   03/26/20 1106  BP: 104/62  Pulse: 85  Weight: 136 lb 9.6 oz (62 kg)  Height: 5\' 1"  (1.549 m)   Body mass index is 25.81 kg/m.  Generalized: Well developed, in no acute distress   Neurological examination  Mentation: Alert oriented to time, place, history taking. Follows  all commands speech and language fluent Cranial nerve II-XII: Pupils were equal round reactive to light. Extraocular movements were full, visual field were full on confrontational test. Facial sensation and strength were normal. Head turning and shoulder shrug  were normal and symmetric. Motor: Good strength, exception right arm in sling Sensory: Sensory testing is intact to soft touch on all 4 extremities. No evidence of extinction is noted.  Coordination: Good finger-nose-finger with the left, right was not performed, heel-to-shin is intact bilaterally Gait and station: Gait is normal.  Reflexes: Deep tendon reflexes are symmetric but depressed  DIAGNOSTIC DATA (LABS, IMAGING, TESTING) - I reviewed patient records, labs, notes, testing and imaging myself where available.  Lab Results  Component Value Date   WBC 5.2 03/06/2020   HGB 13.6 03/06/2020   HCT 41.1 03/06/2020   MCV 97.2 03/06/2020   PLT 299 03/06/2020      Component Value Date/Time   NA 134 (L) 03/06/2020 1323   K 4.1 03/06/2020 1323   CL 95 (L) 03/06/2020 1323   CO2 31 03/06/2020 1323   GLUCOSE 85 03/06/2020 1323   BUN 22 03/06/2020 1323   CREATININE 0.87 03/06/2020 1323   CALCIUM 10.3 03/06/2020 1323   PROT 7.1 03/06/2020 1323   PROT 6.6 11/23/2017 0852   ALBUMIN 4.4 03/06/2020 1323   AST 24 03/06/2020 1323   ALT 15 03/06/2020 1323   ALKPHOS 77 03/06/2020 1323   BILITOT 0.6 03/06/2020 1323   GFRNONAA >60 03/06/2020 1323   GFRAA 55 (L) 01/22/2020 1151   GFRAA >60 12/06/2019 0949   Lab Results  Component Value Date   CHOL 161 03/06/2020   HDL 77 03/06/2020   LDLCALC 64 03/06/2020   TRIG 99 03/06/2020   CHOLHDL 2.1 03/06/2020  No results found for: HGBA1C Lab Results  Component Value Date   VITAMINB12 2,378 (H) 08/13/2018   Lab Results  Component Value Date   TSH 2.500 05/20/2019   ASSESSMENT AND PLAN 86 y.o. year old female  has a past medical history of Arthritis, Complication of anesthesia,  Coronary artery disease, DI (detrusor instability), Fracture of vertebra, GERD (gastroesophageal reflux disease), History of hiatal hernia, Hypertension, Hypothyroidism, Menopausal symptoms, MGUS (monoclonal gammopathy of unknown significance) (12/27/2017), Osteoporosis, Peripheral neuropathy (12/27/2017), Peripheral neuropathy, Pneumonia, Restless leg syndrome, and Varicose veins. here with:  1.  Peripheral neuropathy 2.  MGUS, IgA 3.  Chronic low back pain 4.  Mild memory disturbance  -Has previously been unable to tolerate gabapentin, Lyrica, Cymbalta, Keppra, or Effexor, wants to try another medication -Will start low dose carbamazepine 100 mg tablet, 1/2 tablet twice a day for neuropathy pain, she will stop it if it makes her feel dizzy or off balance -Will send in prescription neuropathy cream -Follow-up in 6 months or sooner if needed  I spent 30 minutes of face-to-face and non-face-to-face time with patient.  This included previsit chart review, lab review, study review, order entry, electronic health record documentation, patient education.  Butler Denmark, AGNP-C, DNP 03/26/2020, 11:39 AM [facility] [address], [place], [place] [address] [phone]

## 2020-03-26 NOTE — Patient Instructions (Addendum)
Let's try carbamazepine 100 mg tablet , take 1/2 tablet twice daily for neuropathy pain  I will send in neuropathy cream for you to try See you back in 6 months   Carbamazepine chewable tablets What is this medicine? CARBAMAZEPINE (kar ba MAZ e peen) is used to control seizures caused by certain types of epilepsy. This medicine is also used to treat nerve related pain. It is not for common aches and pains. This medicine may be used for other purposes; ask your health care provider or pharmacist if you have questions. COMMON BRAND NAME(S): Tegretol What should I tell my health care provider before I take this medicine? They need to know if you have any of these conditions:  Asian ancestry  bone marrow disease  glaucoma  heart disease or irregular heartbeat  kidney disease  liver disease  low blood counts, like low white cell, platelet, or red cell counts  porphyria  psychotic disorders  suicidal thoughts, plans, or attempt; a previous suicide attempt by you or a family member  an unusual or allergic reaction to carbamazepine, tricyclic antidepressants, phenytoin, phenobarbital or other medicines, foods, dyes, or preservatives  pregnant or trying to get pregnant  breast-feeding How should I use this medicine? Take this medicine by mouth. Chew it or swallow whole. Follow the directions on the prescription label. Take this medicine with food. Take your doses at regular intervals. Do not take your medicine more often than directed. Do not stop taking this medicine except on the advice of your doctor or health care professional. A special MedGuide will be given to you by the pharmacist with each prescription and refill. Be sure to read this information carefully each time. Talk to your pediatrician regarding the use of this medicine in children. While this drug may be prescribed for children 74 years of age and younger for selected conditions, precautions do apply. Overdosage: If  you think you have taken too much of this medicine contact a poison control center or emergency room at once. NOTE: This medicine is only for you. Do not share this medicine with others. What if I miss a dose? If you miss a dose, take it as soon as you can. If it is almost time for your next dose, take only that dose. Do not take double or extra doses. What may interact with this medicine? Do not take this medicine with any of the following medications:  certain medicines used to treat HIV infection or AIDS that are given in combination with cobicistat   delavirdine   MAOIs like Carbex, Eldepryl, Marplan, Nardil, and Parnate   nefazodone   oxcarbazepine This medicine may also interact with the following medications:   acetaminophen   acetazolamide   barbiturate medicines for inducing sleep or treating seizures, like phenobarbital   certain antibiotics like clarithromycin, erythromycin or troleandomycin   cimetidine   cyclosporine   danazol   dicumarol   doxycycline   female hormones, including estrogens and birth control pills   grapefruit juice   isoniazid, INH   levothyroxine and other thyroid hormones   lithium and other medicines to treat mood problems or psychotic disturbances   loratadine   medicines for angina or high blood pressure   medicines for cancer   medicines for depression or anxiety   medicines for sleep   medicines to treat fungal infections, like fluconazole, itraconazole or ketoconazole   medicines used to treat HIV infection or AIDS   methadone   niacinamide   praziquantel  propoxyphene   rifampin or rifabutin   seizure or epilepsy medicine   steroid medicines such as prednisone or cortisone   theophylline   tramadol   warfarin This list may not describe all possible interactions. Give your health care provider a list of all the medicines, herbs, non-prescription drugs, or dietary  supplements you use. Also tell them if you smoke, drink alcohol, or use illegal drugs. Some items may interact with your medicine. What should I watch for while using this medicine? Visit your doctor or healthcare provider for a regular check on your progress. Do not change brands or dosage forms of this medicine without discussing the change with your doctor or healthcare provider. If you are taking this medicine for epilepsy (seizures), do not stop taking it suddenly. This increases the risk of seizures. Wear a Probation officer or necklace. Carry an identification card with information about your condition, medications, and doctor or healthcare provider. You may get drowsy, dizzy, or have blurred vision. Do not drive, use machinery, or do anything that needs mental alertness until you know how this medicine affects you. To reduce dizzy or fainting spells, do not sit or stand up quickly, especially if you are an older patient. Alcohol can increase drowsiness and dizziness. Avoid alcoholic drinks. This medicine may cause serious skin reactions. They can happen weeks to months after starting the medicine. Contact your healthcare provider right away if you notice fevers or flu-like symptoms with a rash. The rash may be red or purple and then turn into blisters or peeling of the skin. Or, you might notice a red rash with swelling of the face, lips or lymph nodes in your neck or under your arms. Birth control pills may not work properly while you are taking this medicine. Talk to your doctor about using an extra method of birth control. This medicine can make you more sensitive to the sun. Keep out of the sun. If you cannot avoid being in the sun, wear protective clothing and use sunscreen. Do not use sun lamps or tanning beds/booths. The use of this medicine may increase the chance of suicidal thoughts or actions. Pay special attention to how you are responding while on this medicine. Any worsening of mood,  or thoughts of suicide or dying should be reported to your healthcare provider right away. Women who become pregnant while using this medicine may enroll in the [place] Pregnancy Registry by calling [phone]. This registry collects information about the safety of antiepileptic drug use during pregnancy. This medicine may cause a decrease in vitamin D and folic acid. You should make sure that you get enough vitamins while you are taking this medicine. Discuss the foods you eat and the vitamins you take with your healthcare provider. What side effects may I notice from receiving this medicine? Side effects that you should report to your doctor or health care professional as soon as possible:  allergic reactions like skin rash, itching or hives, swelling of the face, lips, or tongue  breathing problems  change in vision  confusion  dark urine  fast or irregular heartbeat  fever or chills, sore throat  mouth ulcers  pain or difficulty passing urine  rash, fever, and swollen lymph nodes  redness, blistering, peeling, or loosening of the skin, including inside the mouth  ringing in the ears  seizures  stomach pain  swollen joints or muscle/joint aches and pains  unusual bleeding or bruising  unusually weak or tired  vomiting  worsening of mood, thoughts or actions of suicide or dying  yellowing of the eyes or skin Side effects that usually do not require medical attention (report to your doctor or health care professional if they continue or are bothersome):  clumsiness or unsteadiness  diarrhea or constipation  headache  increased sweating  nausea This list may not describe all possible side effects. Call your doctor for medical advice about side effects. You may report side effects to FDA at 1-800-FDA-1088. Where should I keep my medicine? Keep out of reach of children. Store at room temperature below 30 degrees C (86 degrees F).  Keep container tightly closed. Protect from moisture. Throw away any unused medicine after the expiration date. NOTE: This sheet is a summary. It may not cover all possible information. If you have questions about this medicine, talk to your doctor, pharmacist, or health care provider.  2020 Elsevier/Gold Standard (2018-08-07 11:12:31)

## 2020-04-06 ENCOUNTER — Telehealth: Payer: Self-pay | Admitting: *Deleted

## 2020-04-06 NOTE — Telephone Encounter (Signed)
Received fax from compounding pharmacy related neuropathy cream.

## 2020-04-08 ENCOUNTER — Other Ambulatory Visit: Payer: Self-pay | Admitting: Neurosurgery

## 2020-04-08 DIAGNOSIS — M160 Bilateral primary osteoarthritis of hip: Secondary | ICD-10-CM

## 2020-04-09 ENCOUNTER — Other Ambulatory Visit: Payer: Self-pay | Admitting: Orthopedic Surgery

## 2020-04-09 DIAGNOSIS — Z4789 Encounter for other orthopedic aftercare: Secondary | ICD-10-CM

## 2020-04-10 ENCOUNTER — Other Ambulatory Visit: Payer: Self-pay

## 2020-04-10 ENCOUNTER — Ambulatory Visit
Admission: RE | Admit: 2020-04-10 | Discharge: 2020-04-10 | Disposition: A | Discharge status: Home / Self Care | Payer: Medicare Other | Source: Ambulatory Visit | Attending: Orthopedic Surgery | Admitting: Orthopedic Surgery

## 2020-04-10 DIAGNOSIS — Z96611 Presence of right artificial shoulder joint: Secondary | ICD-10-CM | POA: Diagnosis not present

## 2020-04-10 DIAGNOSIS — M19011 Primary osteoarthritis, right shoulder: Secondary | ICD-10-CM | POA: Diagnosis not present

## 2020-04-10 DIAGNOSIS — Z4789 Encounter for other orthopedic aftercare: Secondary | ICD-10-CM

## 2020-04-20 ENCOUNTER — Ambulatory Visit: Payer: Medicare Other | Admitting: Interventional Cardiology

## 2020-04-22 DIAGNOSIS — E039 Hypothyroidism, unspecified: Secondary | ICD-10-CM | POA: Diagnosis not present

## 2020-04-22 DIAGNOSIS — E785 Hyperlipidemia, unspecified: Secondary | ICD-10-CM | POA: Diagnosis not present

## 2020-04-28 ENCOUNTER — Other Ambulatory Visit: Payer: Medicare Other

## 2020-04-28 DIAGNOSIS — M810 Age-related osteoporosis without current pathological fracture: Secondary | ICD-10-CM | POA: Diagnosis not present

## 2020-04-29 DIAGNOSIS — I10 Essential (primary) hypertension: Secondary | ICD-10-CM | POA: Diagnosis not present

## 2020-04-29 DIAGNOSIS — Z0000 Encounter for general adult medical examination without abnormal findings: Secondary | ICD-10-CM | POA: Diagnosis not present

## 2020-04-29 DIAGNOSIS — I129 Hypertensive chronic kidney disease with stage 1 through stage 4 chronic kidney disease, or unspecified chronic kidney disease: Secondary | ICD-10-CM | POA: Diagnosis not present

## 2020-04-29 DIAGNOSIS — G4700 Insomnia, unspecified: Secondary | ICD-10-CM | POA: Diagnosis not present

## 2020-04-29 DIAGNOSIS — E785 Hyperlipidemia, unspecified: Secondary | ICD-10-CM | POA: Diagnosis not present

## 2020-04-29 DIAGNOSIS — I2510 Atherosclerotic heart disease of native coronary artery without angina pectoris: Secondary | ICD-10-CM | POA: Diagnosis not present

## 2020-04-29 DIAGNOSIS — E663 Overweight: Secondary | ICD-10-CM | POA: Diagnosis not present

## 2020-04-29 DIAGNOSIS — R82998 Other abnormal findings in urine: Secondary | ICD-10-CM | POA: Diagnosis not present

## 2020-04-29 DIAGNOSIS — E039 Hypothyroidism, unspecified: Secondary | ICD-10-CM | POA: Diagnosis not present

## 2020-04-29 DIAGNOSIS — N183 Chronic kidney disease, stage 3 unspecified: Secondary | ICD-10-CM | POA: Diagnosis not present

## 2020-05-11 DIAGNOSIS — L570 Actinic keratosis: Secondary | ICD-10-CM | POA: Diagnosis not present

## 2020-05-11 DIAGNOSIS — D692 Other nonthrombocytopenic purpura: Secondary | ICD-10-CM | POA: Diagnosis not present

## 2020-05-11 DIAGNOSIS — Z23 Encounter for immunization: Secondary | ICD-10-CM | POA: Diagnosis not present

## 2020-05-11 DIAGNOSIS — H61002 Unspecified perichondritis of left external ear: Secondary | ICD-10-CM | POA: Diagnosis not present

## 2020-05-11 DIAGNOSIS — L821 Other seborrheic keratosis: Secondary | ICD-10-CM | POA: Diagnosis not present

## 2020-05-11 DIAGNOSIS — Z85828 Personal history of other malignant neoplasm of skin: Secondary | ICD-10-CM | POA: Diagnosis not present

## 2020-05-11 DIAGNOSIS — M25511 Pain in right shoulder: Secondary | ICD-10-CM | POA: Diagnosis not present

## 2020-06-12 DIAGNOSIS — L84 Corns and callosities: Secondary | ICD-10-CM | POA: Diagnosis not present

## 2020-06-12 DIAGNOSIS — M79671 Pain in right foot: Secondary | ICD-10-CM | POA: Diagnosis not present

## 2020-06-30 DIAGNOSIS — M25811 Other specified joint disorders, right shoulder: Secondary | ICD-10-CM | POA: Diagnosis not present

## 2020-07-08 ENCOUNTER — Inpatient Hospital Stay: Payer: Medicare Other | Attending: Hematology & Oncology | Admitting: Hematology & Oncology

## 2020-07-08 ENCOUNTER — Telehealth: Payer: Self-pay

## 2020-07-08 ENCOUNTER — Encounter: Payer: Self-pay | Admitting: Hematology & Oncology

## 2020-07-08 ENCOUNTER — Inpatient Hospital Stay: Payer: Medicare Other

## 2020-07-08 ENCOUNTER — Other Ambulatory Visit: Payer: Self-pay

## 2020-07-08 VITALS — BP 130/92 | HR 81 | Temp 98.2°F | Resp 18 | Wt 143.0 lb

## 2020-07-08 DIAGNOSIS — M791 Myalgia, unspecified site: Secondary | ICD-10-CM | POA: Insufficient documentation

## 2020-07-08 DIAGNOSIS — D472 Monoclonal gammopathy: Secondary | ICD-10-CM | POA: Insufficient documentation

## 2020-07-08 DIAGNOSIS — Z79899 Other long term (current) drug therapy: Secondary | ICD-10-CM | POA: Insufficient documentation

## 2020-07-08 LAB — CBC WITH DIFFERENTIAL (CANCER CENTER ONLY)
Abs Immature Granulocytes: 0.02 10*3/uL (ref 0.00–0.07)
Basophils Absolute: 0 10*3/uL (ref 0.0–0.1)
Basophils Relative: 0 %
Eosinophils Absolute: 0.1 10*3/uL (ref 0.0–0.5)
Eosinophils Relative: 2 %
HCT: 40 % (ref 36.0–46.0)
Hemoglobin: 13.5 g/dL (ref 12.0–15.0)
Immature Granulocytes: 0 %
Lymphocytes Relative: 31 %
Lymphs Abs: 1.8 10*3/uL (ref 0.7–4.0)
MCH: 32.2 pg (ref 26.0–34.0)
MCHC: 33.8 g/dL (ref 30.0–36.0)
MCV: 95.5 fL (ref 80.0–100.0)
Monocytes Absolute: 0.9 10*3/uL (ref 0.1–1.0)
Monocytes Relative: 15 %
Neutro Abs: 3 10*3/uL (ref 1.7–7.7)
Neutrophils Relative %: 52 %
Platelet Count: 292 10*3/uL (ref 150–400)
RBC: 4.19 MIL/uL (ref 3.87–5.11)
RDW: 13.1 % (ref 11.5–15.5)
WBC Count: 5.8 10*3/uL (ref 4.0–10.5)
nRBC: 0 % (ref 0.0–0.2)

## 2020-07-08 LAB — CMP (CANCER CENTER ONLY)
ALT: 16 U/L (ref 0–44)
AST: 24 U/L (ref 15–41)
Albumin: 4.5 g/dL (ref 3.5–5.0)
Alkaline Phosphatase: 80 U/L (ref 38–126)
Anion gap: 7 (ref 5–15)
BUN: 20 mg/dL (ref 8–23)
CO2: 32 mmol/L (ref 22–32)
Calcium: 10.1 mg/dL (ref 8.9–10.3)
Chloride: 96 mmol/L — ABNORMAL LOW (ref 98–111)
Creatinine: 0.98 mg/dL (ref 0.44–1.00)
GFR, Estimated: 57 mL/min — ABNORMAL LOW (ref >60)
Glucose, Bld: 72 mg/dL (ref 70–99)
Potassium: 3.9 mmol/L (ref 3.5–5.1)
Sodium: 135 mmol/L (ref 135–145)
Total Bilirubin: 0.5 mg/dL (ref 0.3–1.2)
Total Protein: 6.7 g/dL (ref 6.5–8.1)

## 2020-07-08 NOTE — Telephone Encounter (Signed)
appts made and printed for pt per 07/08/20 los   [employer]

## 2020-07-08 NOTE — Progress Notes (Signed)
Hematology and Oncology Follow Up Visit  [name] [id number] [birth year] 87 y.o. 07/08/2020   Principle Diagnosis:   IgA Kappa MGUS  Current Therapy:    Observation     Interim History:  [name] is back for follow-up.  She is doing pretty well.  Unfortunately, her right shoulder, which she had operated on about 6 months ago is still having some difficulty.  She is still having some limited range of motion.  She will go back to see the orthopedist for this.  The last time we saw her in October, her monoclonal studies were pretty stable.  Her M spike was 0.3 g/dL.  Her IgA level was 430 mg/dL.  The kappa light chain was 3.1 mg/dL.  She has had the coronavirus vaccines.  She is done well with this.  She has had no problems with fever.  There is no nausea or vomiting.  She has had no cough.  There is no change in bowel or bladder habits.  She has had no leg swelling.  She has had no rashes.  Overall, performance status is ECOG 1.    Medications:  Current Outpatient Medications:  .  ALPRAZolam (XANAX) 0.5 MG tablet, Take 0.5 mg by mouth 2 (two) times daily as needed (restless legs)., Disp: , Rfl:  .  amLODipine (NORVASC) 5 MG tablet, Take 2.5 mg by mouth daily. , Disp: , Rfl:  .  Aspirin Buf,CaCarb-MgCarb-MgO, 81 MG TABS, Take 81 mg by mouth daily., Disp: , Rfl:  .  calcium carbonate (OS-CAL - DOSED IN MG OF ELEMENTAL CALCIUM) 1250 MG tablet, Take 1 tablet by mouth daily.  , Disp: , Rfl:  .  carbamazepine (TEGRETOL) 100 MG chewable tablet, Take 1/2 tablet twice daily, Disp: 60 tablet, Rfl: 5 .  celecoxib (CELEBREX) 200 MG capsule, Take 200 mg by mouth daily as needed for moderate pain. , Disp: , Rfl:  .  dexlansoprazole (DEXILANT) 60 MG capsule, Take 60 mg by mouth daily., Disp: , Rfl:  .  escitalopram (LEXAPRO) 10 MG tablet, Take 10 mg by mouth daily., Disp: , Rfl:  .  levothyroxine (SYNTHROID, LEVOTHROID) 25 MCG tablet, Take 25 mcg by mouth daily before breakfast. , Disp: ,  Rfl:  .  MAGNESIUM PO, Take 1 tablet by mouth daily., Disp: , Rfl:  .  metoprolol succinate (TOPROL-XL) 25 MG 24 hr tablet, Take 25 mg by mouth daily., Disp: , Rfl:  .  Multiple Vitamin (MULTIVITAMIN) tablet, Take 1 tablet by mouth daily.  , Disp: , Rfl:  .  NONFORMULARY OR COMPOUNDED ITEM, Transdermal therapeutics  meloxicam 0.5%, doxepin 3%, amantadine 3%, dextromethorphan 2%, lidocaine 2%., Disp: , Rfl:  .  rosuvastatin (CRESTOR) 20 MG tablet, Take 20 mg by mouth 2 (two) times daily., Disp: , Rfl:  .  triamterene-hydrochlorothiazide (MAXZIDE-25) 37.5-25 MG per tablet, Take 1 tablet by mouth daily.  , Disp: , Rfl:  .  vitamin C (ASCORBIC ACID) 500 MG tablet, Take 1,000 mg by mouth daily. , Disp: , Rfl:  .  Vitamin D, Ergocalciferol, (DRISDOL) 50000 UNITS CAPS, Take 50,000 Units by mouth every 7 (seven) days. , Disp: , Rfl:  .  zolpidem (AMBIEN CR) 12.5 MG CR tablet, Take 12.5 mg by mouth at bedtime., Disp: , Rfl:   Allergies: No Known Allergies  Past Medical History, Surgical history, Social history, and Family History were reviewed and updated.  Review of Systems: Review of Systems  Constitutional: Negative.  Negative for appetite change, fatigue, fever and  unexpected weight change.  HENT:  Negative.  Negative for lump/mass, mouth sores, sore throat and trouble swallowing.   Eyes: Negative.   Respiratory: Negative.  Negative for cough, hemoptysis and shortness of breath.   Cardiovascular: Negative.  Negative for leg swelling and palpitations.  Gastrointestinal: Negative.  Negative for abdominal distention, abdominal pain, blood in stool, constipation, diarrhea, nausea and vomiting.  Endocrine: Negative.   Genitourinary: Negative.  Negative for bladder incontinence, dysuria, frequency and hematuria.   Musculoskeletal: Positive for myalgias. Negative for arthralgias, back pain and gait problem.  Skin: Negative.  Negative for itching and rash.  Neurological: Positive for headaches.  Negative for dizziness, extremity weakness, gait problem, numbness, seizures and speech difficulty.  Hematological: Negative.  Does not bruise/bleed easily.  Psychiatric/Behavioral: Negative.  Negative for depression and sleep disturbance. The patient is not nervous/anxious.     Physical Exam:  weight is 143 lb (64.9 kg). Her oral temperature is 98.2 F (36.8 C). Her blood pressure is 130/92 (abnormal) and her pulse is 81. Her respiration is 18 and oxygen saturation is 98%.   Wt Readings from Last 3 Encounters:  07/08/20 143 lb (64.9 kg)  03/26/20 136 lb 9.6 oz (62 kg)  03/06/20 138 lb (62.6 kg)    Physical Exam Vitals reviewed.  HENT:     Head: Normocephalic and atraumatic.  Eyes:     Pupils: Pupils are equal, round, and reactive to light.  Cardiovascular:     Rate and Rhythm: Normal rate and regular rhythm.     Heart sounds: Normal heart sounds.  Pulmonary:     Effort: Pulmonary effort is normal.     Breath sounds: Normal breath sounds.  Abdominal:     General: Bowel sounds are normal.     Palpations: Abdomen is soft.  Musculoskeletal:        General: No tenderness or deformity. Normal range of motion.     Cervical back: Normal range of motion.  Lymphadenopathy:     Cervical: No cervical adenopathy.  Skin:    General: Skin is warm and dry.     Findings: No erythema or rash.  Neurological:     Mental Status: She is alert and oriented to person, place, and time.  Psychiatric:        Behavior: Behavior normal.        Thought Content: Thought content normal.        Judgment: Judgment normal.      Lab Results  Component Value Date   WBC 5.8 07/08/2020   HGB 13.5 07/08/2020   HCT 40.0 07/08/2020   MCV 95.5 07/08/2020   PLT 292 07/08/2020     Chemistry      Component Value Date/Time   NA 135 07/08/2020 1119   K 3.9 07/08/2020 1119   CL 96 (L) 07/08/2020 1119   CO2 32 07/08/2020 1119   BUN 20 07/08/2020 1119   CREATININE 0.98 07/08/2020 1119      Component  Value Date/Time   CALCIUM 10.1 07/08/2020 1119   ALKPHOS 80 07/08/2020 1119   AST 24 07/08/2020 1119   ALT 16 07/08/2020 1119   BILITOT 0.5 07/08/2020 1119      Impression and Plan: [name] is a 87 year old white female.  She has an IgA kappa MGUS.  This is still at a very low level.  This certainly could be from her age alone.  I feel bad that her right shoulder is still having a tough time recovering from the surgery.  Hopefully, the surgeon will be able to help her out so she can have more mobility and function.  She likes to come back to see is every 4 months.  I will make sure that she comes back to see [place].  I would be surprised if the monoclonal levels are higher.     [name], MD 2/9/202212:22 PM

## 2020-07-09 LAB — IGG, IGA, IGM
IgA: 390 mg/dL (ref 64–422)
IgG (Immunoglobin G), Serum: 721 mg/dL (ref 586–1602)
IgM (Immunoglobulin M), Srm: 74 mg/dL (ref 26–217)

## 2020-07-09 LAB — KAPPA/LAMBDA LIGHT CHAINS
Kappa free light chain: 28.1 mg/L — ABNORMAL HIGH (ref 3.3–19.4)
Kappa, lambda light chain ratio: 1.5 (ref 0.26–1.65)
Lambda free light chains: 18.7 mg/L (ref 5.7–26.3)

## 2020-07-14 LAB — IMMUNOFIXATION REFLEX, SERUM
IgA: 401 mg/dL (ref 64–422)
IgG (Immunoglobin G), Serum: 748 mg/dL (ref 586–1602)
IgM (Immunoglobulin M), Srm: 76 mg/dL (ref 26–217)

## 2020-07-14 LAB — PROTEIN ELECTROPHORESIS, SERUM, WITH REFLEX
A/G Ratio: 1.6 (ref 0.7–1.7)
Albumin ELP: 4.2 g/dL (ref 2.9–4.4)
Alpha-1-Globulin: 0.2 g/dL (ref 0.0–0.4)
Alpha-2-Globulin: 0.6 g/dL (ref 0.4–1.0)
Beta Globulin: 1 g/dL (ref 0.7–1.3)
Gamma Globulin: 0.7 g/dL (ref 0.4–1.8)
Globulin, Total: 2.6 g/dL (ref 2.2–3.9)
M-Spike, %: 0.2 g/dL — ABNORMAL HIGH
SPEP Interpretation: 0
Total Protein ELP: 6.8 g/dL (ref 6.0–8.5)

## 2020-07-15 ENCOUNTER — Encounter: Payer: Self-pay | Admitting: *Deleted

## 2020-07-17 ENCOUNTER — Other Ambulatory Visit: Payer: Self-pay | Admitting: Orthopedic Surgery

## 2020-07-17 ENCOUNTER — Other Ambulatory Visit (HOSPITAL_COMMUNITY): Payer: Self-pay | Admitting: Orthopedic Surgery

## 2020-07-17 DIAGNOSIS — M25811 Other specified joint disorders, right shoulder: Secondary | ICD-10-CM

## 2020-07-17 DIAGNOSIS — M12811 Other specific arthropathies, not elsewhere classified, right shoulder: Secondary | ICD-10-CM

## 2020-07-21 ENCOUNTER — Other Ambulatory Visit: Payer: Self-pay

## 2020-07-21 ENCOUNTER — Encounter (HOSPITAL_COMMUNITY)
Admission: RE | Admit: 2020-07-21 | Discharge: 2020-07-21 | Disposition: A | Discharge status: Home / Self Care | Payer: Medicare Other | Source: Ambulatory Visit | Attending: Orthopedic Surgery | Admitting: Orthopedic Surgery

## 2020-07-21 DIAGNOSIS — M25511 Pain in right shoulder: Secondary | ICD-10-CM | POA: Diagnosis not present

## 2020-07-21 DIAGNOSIS — M25811 Other specified joint disorders, right shoulder: Secondary | ICD-10-CM | POA: Diagnosis not present

## 2020-07-21 DIAGNOSIS — Z96611 Presence of right artificial shoulder joint: Secondary | ICD-10-CM | POA: Diagnosis not present

## 2020-07-21 DIAGNOSIS — M12811 Other specific arthropathies, not elsewhere classified, right shoulder: Secondary | ICD-10-CM

## 2020-07-21 MED ORDER — TECHNETIUM TC 99M MEDRONATE IV KIT
20.0000 | PACK | Freq: Once | INTRAVENOUS | Status: AC | PRN
  Administered 2020-07-21: 20 via INTRAVENOUS

## 2020-07-31 DIAGNOSIS — M25811 Other specified joint disorders, right shoulder: Secondary | ICD-10-CM | POA: Diagnosis not present

## 2020-08-03 ENCOUNTER — Telehealth: Payer: Self-pay | Admitting: *Deleted

## 2020-08-03 NOTE — Telephone Encounter (Signed)
Message received from patient requesting a call back regarding her most recent lab results.  Call placed back to patient to notify her that the M-spike is back down to 0.2 per order of Dr. Marin Olp.  Pt appreciative of call back and has no further questions at this time.

## 2020-08-06 DIAGNOSIS — M5416 Radiculopathy, lumbar region: Secondary | ICD-10-CM | POA: Diagnosis not present

## 2020-08-06 DIAGNOSIS — M5117 Intervertebral disc disorders with radiculopathy, lumbosacral region: Secondary | ICD-10-CM | POA: Diagnosis not present

## 2020-08-28 DIAGNOSIS — Z9079 Acquired absence of other genital organ(s): Secondary | ICD-10-CM | POA: Diagnosis not present

## 2020-08-28 DIAGNOSIS — Z01411 Encounter for gynecological examination (general) (routine) with abnormal findings: Secondary | ICD-10-CM | POA: Diagnosis not present

## 2020-08-28 DIAGNOSIS — Z01419 Encounter for gynecological examination (general) (routine) without abnormal findings: Secondary | ICD-10-CM | POA: Diagnosis not present

## 2020-08-28 DIAGNOSIS — Z1231 Encounter for screening mammogram for malignant neoplasm of breast: Secondary | ICD-10-CM | POA: Diagnosis not present

## 2020-08-28 DIAGNOSIS — Z8041 Family history of malignant neoplasm of ovary: Secondary | ICD-10-CM | POA: Diagnosis not present

## 2020-08-28 DIAGNOSIS — Z6826 Body mass index (BMI) 26.0-26.9, adult: Secondary | ICD-10-CM | POA: Diagnosis not present

## 2020-08-28 DIAGNOSIS — Z800 Family history of malignant neoplasm of digestive organs: Secondary | ICD-10-CM | POA: Diagnosis not present

## 2020-08-28 DIAGNOSIS — R971 Elevated cancer antigen 125 [CA 125]: Secondary | ICD-10-CM | POA: Diagnosis not present

## 2020-08-28 DIAGNOSIS — Z124 Encounter for screening for malignant neoplasm of cervix: Secondary | ICD-10-CM | POA: Diagnosis not present

## 2020-09-03 DIAGNOSIS — R971 Elevated cancer antigen 125 [CA 125]: Secondary | ICD-10-CM | POA: Diagnosis not present

## 2020-09-24 ENCOUNTER — Ambulatory Visit: Payer: Medicare Other | Admitting: Neurology

## 2020-10-07 DIAGNOSIS — N182 Chronic kidney disease, stage 2 (mild): Secondary | ICD-10-CM | POA: Diagnosis not present

## 2020-10-07 DIAGNOSIS — I10 Essential (primary) hypertension: Secondary | ICD-10-CM | POA: Diagnosis not present

## 2020-10-07 DIAGNOSIS — I2510 Atherosclerotic heart disease of native coronary artery without angina pectoris: Secondary | ICD-10-CM | POA: Diagnosis not present

## 2020-10-07 DIAGNOSIS — I129 Hypertensive chronic kidney disease with stage 1 through stage 4 chronic kidney disease, or unspecified chronic kidney disease: Secondary | ICD-10-CM | POA: Diagnosis not present

## 2020-10-14 ENCOUNTER — Ambulatory Visit (INDEPENDENT_AMBULATORY_CARE_PROVIDER_SITE_OTHER): Payer: Medicare Other | Admitting: Neurology

## 2020-10-14 ENCOUNTER — Encounter: Payer: Self-pay | Admitting: Neurology

## 2020-10-14 ENCOUNTER — Telehealth: Payer: Self-pay | Admitting: *Deleted

## 2020-10-14 VITALS — BP 137/83 | HR 78 | Ht 61.0 in | Wt 140.0 lb

## 2020-10-14 DIAGNOSIS — G603 Idiopathic progressive neuropathy: Secondary | ICD-10-CM

## 2020-10-14 DIAGNOSIS — D472 Monoclonal gammopathy: Secondary | ICD-10-CM

## 2020-10-14 NOTE — Telephone Encounter (Signed)
[facility] ([facility]).  Faxed over order for cream for PN.  Neuropathic formaulation ketiprofen 10% gabapentin 4% Lidocaine 5% amitriptyline 2 %.  [phone], and fax [phone] with [employer], insurance.  Received fax confirmation.

## 2020-10-14 NOTE — Patient Instructions (Signed)
Check with pharmacy about compounding options, send [place] a form  Will discontinue the carbamazepine  See you back in 1 year

## 2020-10-14 NOTE — Progress Notes (Signed)
PATIENT: [name] DOB: [birth year]  REASON FOR VISIT: follow up HISTORY FROM: patient  HISTORY OF PRESENT ILLNESS: Today 10/14/20  [name] is an 87 year old female with history of IgA MGUS, peripheral neuropathy, gait disorder. Sees Dr. Marin Olp for IgA kappa MGUS, still very low level, could be from age alone.  Has not been able to tolerate multiple medications for peripheral neuropathy including gabapentin, Lyrica, Cymbalta, Effexor, Keppra.  Has had MRI of the brain that showed mild white matter changes. Dr. Ellene Route has followed for chronic low back pain, in April 2021 Dr. Jannifer Franklin sent for [facility], never had done. Had COVID 6 weeks ago was mild but she took anti-viral, discouraged she got it, was vaccinated and very careful where she goes. Skin bruises easily, especially legs. Carbamazepine helped, has been of since COVID, using topical spray, works well, but is $ 120. [facility], may be able to help. Feels getting stiff, slowing down. Legs burn and hurt all the time, like sun burn. Sleeps okay. Right shoulder is doing better, almost at a year post-op. Prefers to be off oral medications. Here today alone.   Update 03/26/2020 SS: [name] is an 87 year old female with history of an IgA MGUS, has peripheral neuropathy and associated gait disorder.  She has previously not been able to tolerate gabapentin, Lyrica, Cymbalta, or Keppra for her neuropathy.  When last seen, was placed on low-dose Effexor 37.5 mg, Dr. Ellene Route has followed her chronic low back pain, Dr. Jannifer Franklin sent for [facility] (never had done). Could not tolerate Effexor, made her head feel crazy. Continues to report burning/heat sensitivity to her feet, lower legs, is more bothersome, dry skin to legs, using coconut oil. Recently had right shoulder replacement, was doing well, but had to go back into a sling. Saw oncology recently, had a small spike of protein, felt related to shoulder surgery, follows up every 4 months. Lives  alone, drives a car. Here today for follow-up unaccompanied.  HISTORY  09/24/2019 Dr.Willis: [name] is an 87 year old right-handed white female with a history of an IgA MGUS.  The patient has a peripheral neuropathy and associated gait disorder.  She has had a recent fall about a month ago and fractured her left hand.  She also has had a fall in the past resulting in a concussion.  She still has some mild left-sided headaches that occur about once a week, she treats this with Tylenol.  In the past she has not been able to tolerate multiple medications for her neuropathy including gabapentin, Lyrica, Cymbalta, and Keppra.  The patient has been evaluated for some memory problems but she denies any significant issues in this regard today.  MRI of the brain showed some mild white matter changes.  The patient has never undergone physical therapy for her balance.  She does not use a cane or a [place].  The patient returns for further evaluation.  REVIEW OF SYSTEMS: Out of a complete 14 system review of symptoms, the patient complains only of the following symptoms, and all other reviewed systems are negative.  See HPI  ALLERGIES: No Known Allergies  HOME MEDICATIONS: Outpatient Medications Prior to Visit  Medication Sig Dispense Refill  . ALPRAZolam (XANAX) 0.5 MG tablet Take 0.5 mg by mouth 2 (two) times daily as needed (restless legs).    Marland Kitchen amLODipine (NORVASC) 5 MG tablet Take 2.5 mg by mouth daily.     . Aspirin Buf,CaCarb-MgCarb-MgO, 81 MG TABS Take 81 mg by mouth daily.    Marland Kitchen  calcium carbonate (OS-CAL - DOSED IN MG OF ELEMENTAL CALCIUM) 1250 MG tablet Take 1 tablet by mouth daily.      . carbamazepine (TEGRETOL) 100 MG chewable tablet Take 1/2 tablet twice daily 60 tablet 5  . celecoxib (CELEBREX) 200 MG capsule Take 200 mg by mouth daily as needed for moderate pain.     Marland Kitchen dexlansoprazole (DEXILANT) 60 MG capsule Take 60 mg by mouth daily.    Marland Kitchen escitalopram (LEXAPRO) 10 MG tablet Take 10 mg  by mouth daily.    Marland Kitchen levothyroxine (SYNTHROID, LEVOTHROID) 25 MCG tablet Take 25 mcg by mouth daily before breakfast.     . MAGNESIUM PO Take 1 tablet by mouth daily.    . metoprolol succinate (TOPROL-XL) 25 MG 24 hr tablet Take 25 mg by mouth daily.    . Multiple Vitamin (MULTIVITAMIN) tablet Take 1 tablet by mouth daily.      . NONFORMULARY OR COMPOUNDED ITEM Transdermal therapeutics  meloxicam 0.5%, doxepin 3%, amantadine 3%, dextromethorphan 2%, lidocaine 2%.    . rosuvastatin (CRESTOR) 20 MG tablet Take 20 mg by mouth 2 (two) times daily.    Marland Kitchen triamterene-hydrochlorothiazide (MAXZIDE-25) 37.5-25 MG per tablet Take 1 tablet by mouth daily.      . vitamin C (ASCORBIC ACID) 500 MG tablet Take 1,000 mg by mouth daily.     . Vitamin D, Ergocalciferol, (DRISDOL) 50000 UNITS CAPS Take 50,000 Units by mouth every 7 (seven) days.     Marland Kitchen zolpidem (AMBIEN CR) 12.5 MG CR tablet Take 12.5 mg by mouth at bedtime.     No facility-administered medications prior to visit.    PAST MEDICAL HISTORY: Past Medical History:  Diagnosis Date  . Arthritis   . Complication of anesthesia    Elevated blood pressure after having last Kyphoplasty; pt stated "I was given Morphine and had to stay overnight"  . Coronary artery disease   . DI (detrusor instability)   . Fracture of vertebra    x 3  . GERD (gastroesophageal reflux disease)   . History of hiatal hernia   . Hypertension   . Hypothyroidism   . Menopausal symptoms   . MGUS (monoclonal gammopathy of unknown significance) 12/27/2017   IgA  . Osteoporosis   . Peripheral neuropathy 12/27/2017  . Peripheral neuropathy   . Pneumonia   . Restless leg syndrome   . Varicose veins     PAST SURGICAL HISTORY: Past Surgical History:  Procedure Laterality Date  . ABDOMINAL HYSTERECTOMY  1992   TAH,BSO  . BLADDER SUSPENSION  2011   CRYOMESH  . CARPAL TUNNEL RELEASE Bilateral 1982  . CHOLECYSTECTOMY  1992  . COLONOSCOPY    . EYE SURGERY Bilateral     Cataract removal  . KYPHOPLASTY     X 3  . LUMBAR LAMINECTOMY WITH COFLEX 1 LEVEL N/A 12/29/2015   Procedure: L3-4 Laminectomy with Coflex;  Surgeon: Kristeen Miss, MD;  Location: [facility];  Service: Neurosurgery;  Laterality: N/A;  L3-4 Laminectomy with coflex  . OOPHORECTOMY     BSO  . REPLACEMENT TOTAL KNEE  2008,  2011   RIGHT 2008, LEFT 2011  . REVERSE SHOULDER ARTHROPLASTY Right 01/30/2020   Procedure: REVERSE SHOULDER ARTHROPLASTY;  Surgeon: Nicholes Stairs, MD;  Location: [facility];  Service: Orthopedics;  Laterality: Right;  2.5 hrs  . SPINAL FUSION  2011  . VERTEBRAL SURGERY     T-8, T-10. T-12  DR Roselee Culver    FAMILY HISTORY: Family History  Problem  Relation Age of Onset  . Heart disease Mother   . Heart disease Father   . Stroke Father   . Hypertension Sister   . Diabetes Sister   . Cancer Sister        OVARIAN CA  . Hypertension Brother   . Heart disease Brother   . Diabetes Sister   . Cancer Sister        Colon  . Heart disease Brother   . Hypertension Brother     SOCIAL HISTORY: Social History   Socioeconomic History  . Marital status: Widowed    Spouse name: Not on file  . Number of children: 2  . Years of education: 62  . Highest education level: Not on file  Occupational History  . Occupation: Retired  Tobacco Use  . Smoking status: Never Smoker  . Smokeless tobacco: Never Used  Vaping Use  . Vaping Use: Never used  Substance and Sexual Activity  . Alcohol use: No  . Drug use: No  . Sexual activity: Yes    Birth control/protection: Surgical    Comment: Hysterectomy  Other Topics Concern  . Not on file  Social History Narrative   Lives  alone   Caffeine use: decaf only   Right handed    Social Determinants of Health   Financial Resource Strain: Not on file  Food Insecurity: Not on file  Transportation Needs: Not on file  Physical Activity: Not on file  Stress: Not on file  Social Connections: Not on file  Intimate Partner  Violence: Not on file   PHYSICAL EXAM  Vitals:   10/14/20 0817  BP: 137/83  Pulse: 78  Weight: 140 lb (63.5 kg)  Height: 5\' 1"  (1.549 m)   Body mass index is 26.45 kg/m.  Generalized: Well developed, in no acute distress  Neurological examination  Mentation: Alert oriented to time, place, history taking. Follows all commands speech and language fluent Cranial nerve II-XII: Pupils were equal round reactive to light. Extraocular movements were full, visual field were full on confrontational test. Facial sensation and strength were normal. Head turning and shoulder shrug  were normal and symmetric. Motor: Good strength all extremities Sensory: Length dependent soft touch deficit lower extremities; lower extremities are sensitive to touch Coordination: Good finger-nose-finger and heel-to-shin bilaterally Gait and station: Gait is normal.  Reflexes: Deep tendon reflexes are symmetric but depressed  DIAGNOSTIC DATA (LABS, IMAGING, TESTING) - I reviewed patient records, labs, notes, testing and imaging myself where available.  Lab Results  Component Value Date   WBC 5.8 07/08/2020   HGB 13.5 07/08/2020   HCT 40.0 07/08/2020   MCV 95.5 07/08/2020   PLT 292 07/08/2020      Component Value Date/Time   NA 135 07/08/2020 1119   K 3.9 07/08/2020 1119   CL 96 (L) 07/08/2020 1119   CO2 32 07/08/2020 1119   GLUCOSE 72 07/08/2020 1119   BUN 20 07/08/2020 1119   CREATININE 0.98 07/08/2020 1119   CALCIUM 10.1 07/08/2020 1119   PROT 6.7 07/08/2020 1119   PROT 6.6 11/23/2017 0852   ALBUMIN 4.5 07/08/2020 1119   AST 24 07/08/2020 1119   ALT 16 07/08/2020 1119   ALKPHOS 80 07/08/2020 1119   BILITOT 0.5 07/08/2020 1119   GFRNONAA 57 (L) 07/08/2020 1119   GFRAA 55 (L) 01/22/2020 1151   GFRAA >60 12/06/2019 0949   Lab Results  Component Value Date   CHOL 161 03/06/2020   HDL 77 03/06/2020   LDLCALC  64 03/06/2020   TRIG 99 03/06/2020   CHOLHDL 2.1 03/06/2020   No results found  for: HGBA1C Lab Results  Component Value Date   VITAMINB12 2,378 (H) 08/13/2018   Lab Results  Component Value Date   TSH 2.500 05/20/2019   ASSESSMENT AND PLAN 87 y.o. year old female  has a past medical history of Arthritis, Complication of anesthesia, Coronary artery disease, DI (detrusor instability), Fracture of vertebra, GERD (gastroesophageal reflux disease), History of hiatal hernia, Hypertension, Hypothyroidism, Menopausal symptoms, MGUS (monoclonal gammopathy of unknown significance) (12/27/2017), Osteoporosis, Peripheral neuropathy (12/27/2017), Peripheral neuropathy, Pneumonia, Restless leg syndrome, and Varicose veins. here with:  1.  Peripheral neuropathy 2.  MGUS, IgA 3.  Chronic low back pain 4.  Mild memory disturbance  -Going to switch compounding pharmacies, last sent to [facility], was expensive -She will reach out to new pharmacy, have them send over a form with their recommendations of what insurance will cover for her, happy to fill out -Prefers to stay off oral medications for her neuropathy discomfort, as she has been quite sensitive, carbamazepine was helpful no significant side effects, but we will discontinue for now -Has previously been unable to tolerate gabapentin, Lyrica, Cymbalta, Keppra, or Effexor -She continues to be quite independent and highly function, driving, living alone -Follow-up in 1 year or sooner if needed  Evangeline Dakin, DNP 10/14/2020, 8:18 AM [facility] [address] [place], [place] [address] [phone]

## 2020-10-14 NOTE — Progress Notes (Signed)
I have read the note, and I agree with the clinical assessment and plan.  Aul Mangieri K Erendida Wrenn   

## 2020-10-15 ENCOUNTER — Encounter: Payer: Self-pay | Admitting: Gastroenterology

## 2020-11-05 ENCOUNTER — Other Ambulatory Visit: Payer: Self-pay | Admitting: *Deleted

## 2020-11-05 ENCOUNTER — Encounter: Payer: Self-pay | Admitting: Hematology & Oncology

## 2020-11-05 ENCOUNTER — Other Ambulatory Visit: Payer: Self-pay

## 2020-11-05 ENCOUNTER — Telehealth: Payer: Self-pay

## 2020-11-05 ENCOUNTER — Inpatient Hospital Stay (HOSPITAL_BASED_OUTPATIENT_CLINIC_OR_DEPARTMENT_OTHER): Payer: Medicare Other | Admitting: Hematology & Oncology

## 2020-11-05 ENCOUNTER — Inpatient Hospital Stay: Payer: Medicare Other | Attending: Hematology & Oncology

## 2020-11-05 VITALS — BP 154/97 | HR 86 | Temp 98.4°F | Resp 20 | Wt 141.4 lb

## 2020-11-05 DIAGNOSIS — E782 Mixed hyperlipidemia: Secondary | ICD-10-CM

## 2020-11-05 DIAGNOSIS — R21 Rash and other nonspecific skin eruption: Secondary | ICD-10-CM | POA: Insufficient documentation

## 2020-11-05 DIAGNOSIS — E119 Type 2 diabetes mellitus without complications: Secondary | ICD-10-CM | POA: Insufficient documentation

## 2020-11-05 DIAGNOSIS — D472 Monoclonal gammopathy: Secondary | ICD-10-CM | POA: Insufficient documentation

## 2020-11-05 DIAGNOSIS — M791 Myalgia, unspecified site: Secondary | ICD-10-CM | POA: Diagnosis not present

## 2020-11-05 DIAGNOSIS — R519 Headache, unspecified: Secondary | ICD-10-CM | POA: Insufficient documentation

## 2020-11-05 DIAGNOSIS — G629 Polyneuropathy, unspecified: Secondary | ICD-10-CM | POA: Diagnosis not present

## 2020-11-05 DIAGNOSIS — Z79899 Other long term (current) drug therapy: Secondary | ICD-10-CM | POA: Diagnosis not present

## 2020-11-05 LAB — CMP (CANCER CENTER ONLY)
ALT: 21 U/L (ref 0–44)
AST: 22 U/L (ref 15–41)
Albumin: 4.4 g/dL (ref 3.5–5.0)
Alkaline Phosphatase: 62 U/L (ref 38–126)
Anion gap: 6 (ref 5–15)
BUN: 18 mg/dL (ref 8–23)
CO2: 33 mmol/L — ABNORMAL HIGH (ref 22–32)
Calcium: 10.6 mg/dL — ABNORMAL HIGH (ref 8.9–10.3)
Chloride: 96 mmol/L — ABNORMAL LOW (ref 98–111)
Creatinine: 0.88 mg/dL (ref 0.44–1.00)
GFR, Estimated: >60 mL/min (ref >60)
Glucose, Bld: 90 mg/dL (ref 70–99)
Potassium: 3.8 mmol/L (ref 3.5–5.1)
Sodium: 135 mmol/L (ref 135–145)
Total Bilirubin: 0.7 mg/dL (ref 0.3–1.2)
Total Protein: 6.7 g/dL (ref 6.5–8.1)

## 2020-11-05 LAB — CBC WITH DIFFERENTIAL (CANCER CENTER ONLY)
Abs Immature Granulocytes: 0.01 10*3/uL (ref 0.00–0.07)
Basophils Absolute: 0 10*3/uL (ref 0.0–0.1)
Basophils Relative: 0 %
Eosinophils Absolute: 0.2 10*3/uL (ref 0.0–0.5)
Eosinophils Relative: 4 %
HCT: 41.3 % (ref 36.0–46.0)
Hemoglobin: 13.9 g/dL (ref 12.0–15.0)
Immature Granulocytes: 0 %
Lymphocytes Relative: 29 %
Lymphs Abs: 1.5 10*3/uL (ref 0.7–4.0)
MCH: 32.6 pg (ref 26.0–34.0)
MCHC: 33.7 g/dL (ref 30.0–36.0)
MCV: 96.7 fL (ref 80.0–100.0)
Monocytes Absolute: 0.7 10*3/uL (ref 0.1–1.0)
Monocytes Relative: 14 %
Neutro Abs: 2.8 10*3/uL (ref 1.7–7.7)
Neutrophils Relative %: 53 %
Platelet Count: 305 10*3/uL (ref 150–400)
RBC: 4.27 MIL/uL (ref 3.87–5.11)
RDW: 13.6 % (ref 11.5–15.5)
WBC Count: 5.3 10*3/uL (ref 4.0–10.5)
nRBC: 0 % (ref 0.0–0.2)

## 2020-11-05 LAB — LIPID PANEL
Cholesterol: 241 mg/dL — ABNORMAL HIGH (ref 0–200)
HDL: 87 mg/dL (ref >40)
LDL Cholesterol: 130 mg/dL — ABNORMAL HIGH (ref 0–99)
Total CHOL/HDL Ratio: 2.8 RATIO
Triglycerides: 118 mg/dL (ref <150)
VLDL: 24 mg/dL (ref 0–40)

## 2020-11-05 NOTE — Progress Notes (Signed)
Hematology and Oncology Follow Up Visit  [name] [id number] [birth year] 87 y.o. 11/05/2020   Principle Diagnosis:  IgA Kappa MGUS  Current Therapy:   Observation     Interim History:  [name] is back for follow-up.  So far, everything is going pretty well with her.  She did have the coronavirus back in April.  She is not sure how she got this.  She was not hospitalized.  She was given some oral therapy for this.  As far as the MGUS is concerned, everything is holding steady with her.  Her last M spike was 0.2 g/dL.  The IgA level was 390 mg/dL.  The Kappa light chain was 2.8 mg/dL.  She has had no issues with nausea or vomiting.  There is been no change in bowel or bladder habits.  Her right shoulder seems to be doing a little bit better.  She has had no issues with rashes.  She does have a little bit of a rash on her lower legs.  I am not sure exactly what could be causing this.  And was looks like a diabetic rash bilateral think she has diabetes.  She does have some neuropathy.  Overall, her performance status is ECOG 2.     Medications:  Current Outpatient Medications:    ALPRAZolam (XANAX) 0.5 MG tablet, Take 0.5 mg by mouth 2 (two) times daily as needed (restless legs)., Disp: , Rfl:    amLODipine (NORVASC) 5 MG tablet, Take 2.5 mg by mouth daily. , Disp: , Rfl:    Aspirin Buf,CaCarb-MgCarb-MgO, 81 MG TABS, Take 81 mg by mouth daily., Disp: , Rfl:    calcium carbonate (OS-CAL - DOSED IN MG OF ELEMENTAL CALCIUM) 1250 MG tablet, Take 1 tablet by mouth daily.  , Disp: , Rfl:    celecoxib (CELEBREX) 200 MG capsule, Take 200 mg by mouth daily as needed for moderate pain. , Disp: , Rfl:    dexlansoprazole (DEXILANT) 60 MG capsule, Take 60 mg by mouth daily., Disp: , Rfl:    escitalopram (LEXAPRO) 10 MG tablet, Take 10 mg by mouth daily., Disp: , Rfl:    levothyroxine (SYNTHROID, LEVOTHROID) 25 MCG tablet, Take 25 mcg by mouth daily before breakfast. , Disp: , Rfl:     MAGNESIUM PO, Take 1 tablet by mouth daily., Disp: , Rfl:    methocarbamol (ROBAXIN) 500 MG tablet, every 8 (eight) hours as needed., Disp: , Rfl:    metoprolol succinate (TOPROL-XL) 25 MG 24 hr tablet, Take 25 mg by mouth daily., Disp: , Rfl:    Multiple Vitamin (MULTIVITAMIN) tablet, Take 1 tablet by mouth daily.  , Disp: , Rfl:    NONFORMULARY OR COMPOUNDED ITEM, Transdermal therapeutics  meloxicam 0.5%, doxepin 3%, amantadine 3%, dextromethorphan 2%, lidocaine 2%., Disp: , Rfl:    rosuvastatin (CRESTOR) 20 MG tablet, Take 20 mg by mouth 2 (two) times a week., Disp: , Rfl:    triamterene-hydrochlorothiazide (MAXZIDE-25) 37.5-25 MG per tablet, Take 1 tablet by mouth daily.  , Disp: , Rfl:    vitamin C (ASCORBIC ACID) 500 MG tablet, Take 1,000 mg by mouth daily. , Disp: , Rfl:    Vitamin D, Ergocalciferol, (DRISDOL) 50000 UNITS CAPS, Take 50,000 Units by mouth every 7 (seven) days. , Disp: , Rfl:    zolpidem (AMBIEN CR) 12.5 MG CR tablet, Take 12.5 mg by mouth at bedtime., Disp: , Rfl:   Allergies: No Known Allergies  Past Medical History, Surgical history, Social history, and Family  History were reviewed and updated.  Review of Systems: Review of Systems  Constitutional: Negative.  Negative for appetite change, fatigue, fever and unexpected weight change.  HENT:  Negative.  Negative for lump/mass, mouth sores, sore throat and trouble swallowing.   Eyes: Negative.   Respiratory: Negative.  Negative for cough, hemoptysis and shortness of breath.   Cardiovascular: Negative.  Negative for leg swelling and palpitations.  Gastrointestinal: Negative.  Negative for abdominal distention, abdominal pain, blood in stool, constipation, diarrhea, nausea and vomiting.  Endocrine: Negative.   Genitourinary: Negative.  Negative for bladder incontinence, dysuria, frequency and hematuria.   Musculoskeletal:  Positive for myalgias. Negative for arthralgias, back pain and gait problem.  Skin: Negative.   Negative for itching and rash.  Neurological:  Positive for headaches. Negative for dizziness, extremity weakness, gait problem, numbness, seizures and speech difficulty.  Hematological: Negative.  Does not bruise/bleed easily.  Psychiatric/Behavioral: Negative.  Negative for depression and sleep disturbance. The patient is not nervous/anxious.    Physical Exam:  weight is 141 lb 6.4 oz (64.1 kg). Her oral temperature is 98.4 F (36.9 C). Her blood pressure is 154/97 (abnormal) and her pulse is 86. Her respiration is 20 and oxygen saturation is 96%.   Wt Readings from Last 3 Encounters:  11/05/20 141 lb 6.4 oz (64.1 kg)  10/14/20 140 lb (63.5 kg)  07/08/20 143 lb (64.9 kg)    Physical Exam Vitals reviewed.  HENT:     Head: Normocephalic and atraumatic.  Eyes:     Pupils: Pupils are equal, round, and reactive to light.  Cardiovascular:     Rate and Rhythm: Normal rate and regular rhythm.     Heart sounds: Normal heart sounds.  Pulmonary:     Effort: Pulmonary effort is normal.     Breath sounds: Normal breath sounds.  Abdominal:     General: Bowel sounds are normal.     Palpations: Abdomen is soft.  Musculoskeletal:        General: No tenderness or deformity. Normal range of motion.     Cervical back: Normal range of motion.  Lymphadenopathy:     Cervical: No cervical adenopathy.  Skin:    General: Skin is warm and dry.     Findings: No erythema or rash.  Neurological:     Mental Status: She is alert and oriented to person, place, and time.  Psychiatric:        Behavior: Behavior normal.        Thought Content: Thought content normal.        Judgment: Judgment normal.     Lab Results  Component Value Date   WBC 5.3 11/05/2020   HGB 13.9 11/05/2020   HCT 41.3 11/05/2020   MCV 96.7 11/05/2020   PLT 305 11/05/2020     Chemistry      Component Value Date/Time   NA 135 11/05/2020 0952   K 3.8 11/05/2020 0952   CL 96 (L) 11/05/2020 0952   CO2 33 (H) 11/05/2020  0952   BUN 18 11/05/2020 0952   CREATININE 0.88 11/05/2020 0952      Component Value Date/Time   CALCIUM 10.6 (H) 11/05/2020 0952   ALKPHOS 62 11/05/2020 0952   AST 22 11/05/2020 0952   ALT 21 11/05/2020 0952   BILITOT 0.7 11/05/2020 0952      Impression and Plan: [name] is a 87 year old white female.  She has an IgA kappa MGUS.  This is still at a very low level.  This certainly could be from her age alone.  So far, everything is holding nice and stable with respect to the MGUS.  I really do not think this is ever going be a problem for her.  We still will follow her closely.  We will have her come back in 4 months.  We will get her through summertime.    [name], MD 6/9/202211:26 AM

## 2020-11-05 NOTE — Telephone Encounter (Signed)
Appts made and printed for pt per 11/05/20 los  [employer]

## 2020-11-06 ENCOUNTER — Encounter: Payer: Self-pay | Admitting: *Deleted

## 2020-11-06 LAB — KAPPA/LAMBDA LIGHT CHAINS
Kappa free light chain: 28.6 mg/L — ABNORMAL HIGH (ref 3.3–19.4)
Kappa, lambda light chain ratio: 1.55 (ref 0.26–1.65)
Lambda free light chains: 18.4 mg/L (ref 5.7–26.3)

## 2020-11-06 LAB — IGG, IGA, IGM
IgA: 380 mg/dL (ref 64–422)
IgG (Immunoglobin G), Serum: 680 mg/dL (ref 586–1602)
IgM (Immunoglobulin M), Srm: 70 mg/dL (ref 26–217)

## 2020-11-09 LAB — IMMUNOFIXATION REFLEX, SERUM
IgA: 385 mg/dL (ref 64–422)
IgG (Immunoglobin G), Serum: 681 mg/dL (ref 586–1602)
IgM (Immunoglobulin M), Srm: 70 mg/dL (ref 26–217)

## 2020-11-09 LAB — PROTEIN ELECTROPHORESIS, SERUM, WITH REFLEX
A/G Ratio: 1.5 (ref 0.7–1.7)
Albumin ELP: 4 g/dL (ref 2.9–4.4)
Alpha-1-Globulin: 0.2 g/dL (ref 0.0–0.4)
Alpha-2-Globulin: 0.7 g/dL (ref 0.4–1.0)
Beta Globulin: 1.1 g/dL (ref 0.7–1.3)
Gamma Globulin: 0.7 g/dL (ref 0.4–1.8)
Globulin, Total: 2.7 g/dL (ref 2.2–3.9)
M-Spike, %: 0.2 g/dL — ABNORMAL HIGH
SPEP Interpretation: 0
Total Protein ELP: 6.7 g/dL (ref 6.0–8.5)

## 2020-11-10 ENCOUNTER — Encounter: Payer: Self-pay | Admitting: *Deleted

## 2020-11-10 DIAGNOSIS — D0472 Carcinoma in situ of skin of left lower limb, including hip: Secondary | ICD-10-CM | POA: Diagnosis not present

## 2020-11-10 DIAGNOSIS — D692 Other nonthrombocytopenic purpura: Secondary | ICD-10-CM | POA: Diagnosis not present

## 2020-11-10 DIAGNOSIS — D485 Neoplasm of uncertain behavior of skin: Secondary | ICD-10-CM | POA: Diagnosis not present

## 2020-11-10 DIAGNOSIS — L570 Actinic keratosis: Secondary | ICD-10-CM | POA: Diagnosis not present

## 2020-11-10 DIAGNOSIS — Z85828 Personal history of other malignant neoplasm of skin: Secondary | ICD-10-CM | POA: Diagnosis not present

## 2020-11-10 DIAGNOSIS — L821 Other seborrheic keratosis: Secondary | ICD-10-CM | POA: Diagnosis not present

## 2020-11-10 DIAGNOSIS — L814 Other melanin hyperpigmentation: Secondary | ICD-10-CM | POA: Diagnosis not present

## 2020-11-11 ENCOUNTER — Telehealth: Payer: Self-pay | Admitting: Interventional Cardiology

## 2020-11-11 NOTE — Telephone Encounter (Signed)
Pt c/o BP issue: STAT if pt c/o blurred vision, one-sided weakness or slurred speech  1. What are your last 5 BP readings? 149/102 HR 109  2. Are you having any other symptoms (ex. Dizziness, headache, blurred vision, passed out)? Headache in the back and cross the top of her head.  3. What is your BP issue? BP high

## 2020-11-11 NOTE — Telephone Encounter (Deleted)
Gave patient recommendation from Dr. Tamala Julian to reach out to PCP. Patient seems upset stating, no one wants to help old patients anymore, I'll call my PCP in a few days, guess I'll have to treat myself, etc.  Educated patient that Dr. Reynaldo Minium is the doctor prescribing the BP medications which is why he should be contacted, also that

## 2020-11-11 NOTE — Telephone Encounter (Signed)
Gave patient recommendation from Dr. Tamala Julian to reach out to PCP for BP concerns. Patient seems upset stating, no one wants to help old patients anymore, I'll call my PCP in a few days, guess I'll have to treat myself, etc. Educated patient about why Dr. Tamala Julian was recommending this route and also that Dr. Tamala Julian is not the prescribing doctor of her BP medications. After re education patient calmed and said she would call her PCP today. Advised that the ED precautions still apply while she is waiting for a call back from her PCP. She verbalized understanding.

## 2020-11-11 NOTE — Telephone Encounter (Signed)
Patient states BP has been running high for the past several days at office visit with oncology on 11/05/20 BP 154/97. Advised to get her high BP checked out.   11/06/20 AM 122/87 HR104  Noon 146/98 HR 98 PM 129/86 HR 86 11/10/20 AM 153/108 HR110  Noon 146/115 HR 106 PM 126/94 HR 111 PM 144/98 HR89 This am before breakfast and medications  135/92 109 After breakfast and medications taken  149/102 92 Hour later  135/100 100  Current 119/77 HR 105  Patient has been waking up with headaches and seem to fatigued more easily.

## 2020-11-18 DIAGNOSIS — H52203 Unspecified astigmatism, bilateral: Secondary | ICD-10-CM | POA: Diagnosis not present

## 2020-11-18 DIAGNOSIS — Z961 Presence of intraocular lens: Secondary | ICD-10-CM | POA: Diagnosis not present

## 2020-11-18 DIAGNOSIS — H26491 Other secondary cataract, right eye: Secondary | ICD-10-CM | POA: Diagnosis not present

## 2020-11-19 DIAGNOSIS — M7741 Metatarsalgia, right foot: Secondary | ICD-10-CM | POA: Diagnosis not present

## 2020-11-19 DIAGNOSIS — L84 Corns and callosities: Secondary | ICD-10-CM | POA: Diagnosis not present

## 2020-11-19 DIAGNOSIS — M79671 Pain in right foot: Secondary | ICD-10-CM | POA: Diagnosis not present

## 2020-12-02 ENCOUNTER — Ambulatory Visit (INDEPENDENT_AMBULATORY_CARE_PROVIDER_SITE_OTHER): Payer: Medicare Other | Admitting: Gastroenterology

## 2020-12-02 ENCOUNTER — Encounter: Payer: Self-pay | Admitting: Gastroenterology

## 2020-12-02 VITALS — BP 100/70 | HR 96 | Ht 60.63 in | Wt 142.1 lb

## 2020-12-02 DIAGNOSIS — R140 Abdominal distension (gaseous): Secondary | ICD-10-CM | POA: Diagnosis not present

## 2020-12-02 DIAGNOSIS — K31A0 Gastric intestinal metaplasia, unspecified: Secondary | ICD-10-CM

## 2020-12-02 DIAGNOSIS — R194 Change in bowel habit: Secondary | ICD-10-CM

## 2020-12-02 MED ORDER — RIFAXIMIN 550 MG PO TABS: 550.0000 mg | ORAL_TABLET | Freq: Three times a day (TID) | ORAL | 0 refills | Status: AC

## 2020-12-02 NOTE — Patient Instructions (Addendum)
It was my pleasure to provide care to you you today. Based on our discussion, I am providing you with my recommendations below:  RECOMMENDATION(S):   Please add a daily dose of Metamucil, Benefiber or Citrucel  Continue daily probiotics  PRESCRIPTION MEDICATION(S):   We have sent the following medication(s) to your pharmacy:  Xifaxan - please take 550mg  by mouth 3 times daily for 2 weeks  NOTE: If your medication(s) requires a PRIOR AUTHORIZATION, we will receive notification from your pharmacy. Once received, the process to submit for approval may take up to 7-10 business days. You will be contacted about any denials we have received from your insurance company as well as alternatives recommended by your provider.  RECORDS:  We have asked you to sign a Release of Information today to obtain records from Dr. Perley Jain office.  COLONOSCOPY AND ENDOSCOPY:   You have been scheduled for an endoscopy and a colonoscopy. Please follow the written instructions given to you at your visit today.  PREP:   Please pick up your prep supplies at the pharmacy within the next 1-3 days.  INHALERS:   If you use inhalers (even only as needed), please bring them with you on the day of your procedure.  COLONOSCOPY TIPS:  To reduce nausea and dehydration, stay well hydrated for 3-4 days prior to the exam.  To prevent skin/hemorrhoid irritation - prior to wiping, put A&Dointment or vaseline on the toilet paper. Keep a towel or pad on the bed.  BEFORE STARTING YOUR PREP, drink  64oz of clear liquids in the morning. This will help to flush the colon and will ensure you are well hydrated!!!!  NOTE - This is in addition to the fluids required for to complete your prep. Use of a flavored hard candy, such as grape Anise Salvo, can counteract some of the flavor of the prep and may prevent some nausea.   FOLLOW UP:  After your procedure, you will receive a call from my office staff regarding my  recommendation for follow up.  BMI:  If you are age 30 or older, your body mass index should be between 23-30. Your Body mass index is 27.18 kg/m. If this is out of the aforementioned range listed, please consider follow up with your Primary Care Provider.  MY CHART:  The [place] [facility] providers would like to encourage you to use [facility] to communicate with providers for non-urgent requests or questions.  Due to long hold times on the telephone, sending your provider a message by [facility] may be a faster and more efficient way to get a response.  Please allow 48 business hours for a response.  Please remember that this is for non-urgent requests.   Thank you for trusting me with your gastrointestinal care!    Thornton Park, MD, MPH

## 2020-12-02 NOTE — Progress Notes (Signed)
Referring Provider: Avon Gully, NP Primary Care Physician:  Burnard Bunting, MD  Reason for Consultation:  Bloating   IMPRESSION:  Chronic bloating without alarm features  Gastritis with focal intestinal metaplasia 2016 Altered bowel habits - now with fecal soiling and accidents Large hiatal hernia Unexplained elevated Ca-125  Chronic bloating and distension without alarm features. The differential for bloating and distention includes small intestinal bacterial overgrowth, carbohydrate intolerance including lactose and fructose, celiac disease, pancreatic insufficiency, GI dysmotility including gastroparesis, hypothyroidism and body mechanics. Disorders of the brain-gut interaction also present with bloating and distension including IBS, chronic constipation, functional dyspepsia, pelvic floor dysfunction and functional bloating.  With her history of elevated Ca-125 I would favor repeating abdominal imaging. However, the patient feels like this was just performed.   Gastritis with focal intestinal metaplasia on EGD 2016: Plan EGD with gastric mapping given her symptoms.  Altered bowel habits: Add a stool bulking agent. Colonoscopy recommended. Consider anorectal manometry after colonoscopy.  Large hiatal hernia: Associated GERD well-controlled on daily PPI.     PLAN: - Add a daily dose of Metamucil, Benefiber, or Citrucel - Avoid artifical sweateners - Continue daily probiotics - Trial of Xifaxan 550mg  TID x 2 weeks - Obtain prior records from Dr. Watt Climes about prior colonoscopy, pathology results, and office visits - EGD with gastric mapping and colonoscopy - Contrasted cross-sectional imaging given her unexplained elevated Ca-125 if her endoscopic evaluation is negative and symptoms are not improving   Please see the "Patient Instructions" section for addition details about the plan.  HPI: [name] is a 87 y.o. female who was referred by NP Orene Desanctis for evaluation of  bloating. The history is obtained through the patient, review of her electronic health record, and referral records provided by NP [facility]. Previously seen by Dr. Watt Climes for similar symptoms. History is remarkable for elevated Ca-125 without obvious cause despite evaluation by Gyn Onc. Last seen by Dr. Polly Cobia 09/03/20.  She has IgA Kappa MGUS, peripheral neuropathy followed by neurology, gait disorder, nonobstructive coronary disease, hypothyoridism, hypertension, and hyperlipidemia. She has  reflux controlled on daily PPI therapy. Had Covid 3 months ago and a recent shoulder repair. Husband of 17 years died 2 years ago.   Reports a many year history of bloating, worsened after mesh procedure for incontinence 1995. Feels distended with a sense of gasiness. Awakes with symptoms that are present all day with an ongoing sense of distention, but, it's worsened after eating. Associated early satiety and nausea.  Symptoms not related to defecation. She has a BM every morning but she frequent fecal soiling with difficulty keeping her perineum clean. Over the course of the day she will pas several small hard BM requiring need for a pad. Not associated with stress. Appetite is good. Weight is up. No associated abdominal pain. No blood or mucous in the stool. Denies constipation or diarrhea.   Follows a diet void of fatty and greasy foods. Does not drink carbonated drinks. No sugar substitutes. No specific medical treatments. On probiotics for years, recently started Align 5 days.    Previously seen by Dr. Watt Climes for similar symptoms.  She reports that prior colonoscopy with Dr. Watt Climes revealing colon polyps and colitis.  Available records show a tubular adenoma was removed from the descending colon on a colonoscopy 10/20/2011.  Had a colonoscopy 8 years ago and abdominal imaging at that time. He recommended Miralax but she felt that additional evaluation and management may be in order. EGD pathology results from  01/28/15 shows  chronic focally active gastritis with focal intestinal metaplasia. Biopsies were negative for H pylori.   Prior abdominal imaging: - CT chest/abd/pelvis 10/25/19: large hiatal hernia, nonspecific findings in the lung - PET Scan 01/22/20: large hiatal hernia, colonic diverticulosis  Prior endoscopic history: - 12/20/90 Olevia Perches) colonoscopy: tortuous left colon, diverticulosis - 01/20/93 Olevia Perches) colonoscopy for altered bowel habits: poor prep  - 12/25/97 Olevia Perches) Colonoscopy: melanosis coli  Sister with colon cancer at age 21.   Past Medical History:  Diagnosis Date   Anxiety    Arthritis    Chronic headaches    Colon polyps    Complication of anesthesia    Elevated blood pressure after having last Kyphoplasty; pt stated "I was given Morphine and had to stay overnight"   Coronary artery disease    DI (detrusor instability)    Fracture of vertebra    x 3   GERD (gastroesophageal reflux disease)    History of hiatal hernia    HLD (hyperlipidemia)    Hypertension    Hypothyroidism    Menopausal symptoms    MGUS (monoclonal gammopathy of unknown significance) 12/27/2017   IgA   Osteoporosis    Peripheral neuropathy 12/27/2017   Peripheral neuropathy    Pneumonia    Restless leg syndrome    Varicose veins     Past Surgical History:  Procedure Laterality Date   ABDOMINAL HYSTERECTOMY  1992   TAH,BSO   BLADDER SUSPENSION  2011   CRYOMESH   CARPAL TUNNEL RELEASE Bilateral 1982   CHOLECYSTECTOMY  1992   COLONOSCOPY     EYE SURGERY Bilateral    Cataract removal   KYPHOPLASTY     X 3   LUMBAR LAMINECTOMY WITH COFLEX 1 LEVEL N/A 12/29/2015   Procedure: L3-4 Laminectomy with Coflex;  Surgeon: Kristeen Miss, MD;  Location: [facility];  Service: Neurosurgery;  Laterality: N/A;  L3-4 Laminectomy with coflex   OOPHORECTOMY     BSO   REPLACEMENT TOTAL KNEE Bilateral 2008,  2011   RIGHT 2008, LEFT 2011   REVERSE SHOULDER ARTHROPLASTY Right 01/30/2020   Procedure: REVERSE SHOULDER  ARTHROPLASTY;  Surgeon: Nicholes Stairs, MD;  Location: [facility];  Service: Orthopedics;  Laterality: Right;  2.5 hrs   SPINAL FUSION  2011   VERTEBRAL SURGERY     T-8, T-10. T-12  DR Roselee Culver    Current Outpatient Medications  Medication Sig Dispense Refill   ALPRAZolam (XANAX) 0.5 MG tablet Take 0.5 mg by mouth 2 (two) times daily as needed (restless legs).     amLODipine (NORVASC) 5 MG tablet Take 2.5 mg by mouth daily.      Aspirin Buf,CaCarb-MgCarb-MgO, 81 MG TABS Take 81 mg by mouth daily.     calcium carbonate (OS-CAL - DOSED IN MG OF ELEMENTAL CALCIUM) 1250 MG tablet Take 1 tablet by mouth daily.       celecoxib (CELEBREX) 200 MG capsule Take 200 mg by mouth daily as needed for moderate pain.      dexlansoprazole (DEXILANT) 60 MG capsule Take 60 mg by mouth daily.     escitalopram (LEXAPRO) 10 MG tablet Take 10 mg by mouth daily.     levothyroxine (SYNTHROID, LEVOTHROID) 25 MCG tablet Take 25 mcg by mouth daily before breakfast.      MAGNESIUM PO Take 1 tablet by mouth daily.     metoprolol succinate (TOPROL-XL) 25 MG 24 hr tablet Take 25 mg by mouth daily.     Multiple Vitamin (MULTIVITAMIN) tablet  Take 1 tablet by mouth daily.       NONFORMULARY OR COMPOUNDED ITEM Transdermal therapeutics  meloxicam 0.5%, doxepin 3%, amantadine 3%, dextromethorphan 2%, lidocaine 2%.     rifaximin (XIFAXAN) 550 MG TABS tablet Take 1 tablet (550 mg total) by mouth 3 (three) times daily for 14 days. 42 tablet 0   rosuvastatin (CRESTOR) 20 MG tablet Take 20 mg by mouth 2 (two) times a week.     triamterene-hydrochlorothiazide (MAXZIDE-25) 37.5-25 MG per tablet Take 1 tablet by mouth daily.       vitamin C (ASCORBIC ACID) 500 MG tablet Take 1,000 mg by mouth daily.      Vitamin D, Ergocalciferol, (DRISDOL) 50000 UNITS CAPS Take 50,000 Units by mouth every 7 (seven) days.      zolpidem (AMBIEN CR) 12.5 MG CR tablet Take 12.5 mg by mouth at bedtime.     No current facility-administered medications  for this visit.    Allergies as of 12/02/2020   (No Known Allergies)    Family History  Problem Relation Age of Onset   Heart disease Mother    Heart disease Father    Stroke Father    Hypertension Sister    Diabetes Sister    Ovarian cancer Sister    Diabetes Sister    Rectal cancer Sister        ? Colon or rectal   Hyperlipidemia Sister    Hypertension Sister    Hyperlipidemia Sister    Hypertension Sister    Hypertension Brother    Heart disease Brother    Heart disease Brother    Hypertension Brother    Heart disease Brother    Heart disease Brother    Heart disease Brother    Heart disease Brother    Breast cancer Niece    Heart disease Son    Hyperlipidemia Son    Hyperlipidemia Daughter    Hypertension Daughter        Review of Systems: 12 system ROS is negative except as noted above and the additions of arthritis, back pain, fatigue, headaches, muscle pains, insomnia.   Physical Exam: General:   Alert,  well-nourished, pleasant and cooperative in NAD Head:  Normocephalic and atraumatic. Eyes:  Sclera clear, no icterus.   Conjunctiva pink. Ears:  Normal auditory acuity. Nose:  No deformity, discharge,  or lesions. Mouth:  No deformity or lesions.   Neck:  Supple; no masses or thyromegaly. Lungs:  Clear throughout to auscultation.   No wheezes. Heart:  Regular rate and rhythm; no murmurs. Abdomen:  Soft, nontender, nondistended, normal bowel sounds, no rebound or guarding. No hepatosplenomegaly.  No tympanny.  Rectal:  Deferred to the time of upcoming colonoscopy Msk:  Symmetrical. No boney deformities LAD: No inguinal or umbilical LAD Extremities:  No clubbing or edema. Neurologic:  Alert and  oriented x4;  grossly nonfocal Skin:  Intact without significant lesions or rashes. Psych:  Alert and cooperative. Normal mood and affect.     Kelyn Ponciano L. Tarri Glenn, MD, MPH 12/02/2020, 1:29 PM

## 2020-12-02 NOTE — Progress Notes (Signed)
ROI - DR. MAGOD  Location where fax was sent: Dr. Watt Climes  Documents Faxed: Signed ROI Confirmation received: Yes Documents and confirmation held for future reference: Yes

## 2020-12-03 DIAGNOSIS — Z96653 Presence of artificial knee joint, bilateral: Secondary | ICD-10-CM | POA: Diagnosis not present

## 2020-12-03 DIAGNOSIS — M25562 Pain in left knee: Secondary | ICD-10-CM | POA: Diagnosis not present

## 2020-12-04 ENCOUNTER — Telehealth: Payer: Self-pay | Admitting: Interventional Cardiology

## 2020-12-04 NOTE — Telephone Encounter (Signed)
Pt c/o BP issue: STAT if pt c/o blurred vision, one-sided weakness or slurred speech  1. What are your last 5 BP readings? Early morning 165/100-111 HR over 100, after BP meds it takes until mid morning to go down and at night it comes up again   2. Are you having any other symptoms (ex. Dizziness, headache, blurred vision, passed out)? Headache, throbbing in the back of her head when BP is high  3. What is your BP issue? Patient states her BP has been getting high in the morning. She says during the day it may be normal, but before bed it gets higher again.

## 2020-12-04 NOTE — Telephone Encounter (Signed)
Spoke with [name] who complains of elevated BP in the am and late in the evening x 2 months.  [name] reports BP today is 130/94, 140/102 and 147/85.  [name] states she is taking medications as prescribed and is on a low  sodium diet.  [name] denies CP, SOB or dizziness but does complain of throbbing headache in the back of her head upon rising or when BP is elevated.  [name] states she has contacted her PCP but never heard back from him.  She asked scheduler for an earlier appointment to see Dr Tamala Julian but was advised there was nothing any earlier and APP's appointments were not much sooner.   [name] advised most accurate BP is about 2 hours after taking BP medication.  Will forward information to Dr Tamala Julian and his RN.  Reviewed ED precautions with [name].  [name] verbalizes understanding and agrees with current plan.

## 2020-12-07 MED ORDER — AMLODIPINE BESYLATE 5 MG PO TABS: 5.0000 mg | ORAL_TABLET | Freq: Every day | ORAL | 3 refills | Status: DC

## 2020-12-07 NOTE — Telephone Encounter (Signed)
Belva Crome, MD to Thora Lance, RN      11:05 AM Increase Amlodipine from 2.5 mg daily to 5 mg daily. Switch timing ofAmlodipine administration to PM, after 6PM    Spoke with pt and made her aware of recommendations per Dr. Tamala Julian.  Advised to monitor BP for a week and call or MyChart those readings to [place].  Pt agreeable to plan.

## 2020-12-07 NOTE — Addendum Note (Signed)
Addended by: Loren Racer on: 12/07/2020 09:43 AM   Modules accepted: Orders

## 2020-12-09 NOTE — Progress Notes (Signed)
ROI - DR. MAGOD - SECOND REQUEST   Location where fax was sent: Dr. Watt Climes  Documents Faxed: Signed ROI Confirmation received: Yes Documents and confirmation held for future reference: Yes

## 2020-12-16 NOTE — Progress Notes (Signed)
ROI - DR. MAGOD - FINAL REQUEST   Location where fax was sent: Dr. Watt Climes  Documents Faxed: Signed ROI Confirmation received: Yes Documents and confirmation held for future reference: Yes

## 2020-12-23 ENCOUNTER — Ambulatory Visit (AMBULATORY_SURGERY_CENTER): Payer: Medicare Other | Admitting: Gastroenterology

## 2020-12-23 ENCOUNTER — Other Ambulatory Visit: Payer: Self-pay

## 2020-12-23 ENCOUNTER — Encounter: Payer: Self-pay | Admitting: Gastroenterology

## 2020-12-23 VITALS — BP 95/68 | HR 76 | Temp 96.6°F | Resp 17 | Ht 60.0 in | Wt 142.0 lb

## 2020-12-23 DIAGNOSIS — K449 Diaphragmatic hernia without obstruction or gangrene: Secondary | ICD-10-CM

## 2020-12-23 DIAGNOSIS — K2950 Unspecified chronic gastritis without bleeding: Secondary | ICD-10-CM | POA: Diagnosis not present

## 2020-12-23 DIAGNOSIS — R194 Change in bowel habit: Secondary | ICD-10-CM

## 2020-12-23 DIAGNOSIS — K5730 Diverticulosis of large intestine without perforation or abscess without bleeding: Secondary | ICD-10-CM

## 2020-12-23 DIAGNOSIS — K219 Gastro-esophageal reflux disease without esophagitis: Secondary | ICD-10-CM | POA: Diagnosis not present

## 2020-12-23 DIAGNOSIS — K2970 Gastritis, unspecified, without bleeding: Secondary | ICD-10-CM | POA: Diagnosis not present

## 2020-12-23 DIAGNOSIS — K31A0 Gastric intestinal metaplasia, unspecified: Secondary | ICD-10-CM

## 2020-12-23 MED ORDER — SODIUM CHLORIDE 0.9 % IV SOLN: 500.0000 mL | Freq: Once | INTRAVENOUS | Status: DC

## 2020-12-23 NOTE — Patient Instructions (Signed)
Handouts given:  diverticulosis, hemorrhoids, hiatal hernia A high fiber diet is encouraged Continue current medications Add a daily dose of metamucil, benefiber, or citrucel Await pathology results  YOU HAD AN ENDOSCOPIC PROCEDURE TODAY AT [facility]:   Refer to the procedure report that was given to you for any specific questions about what was found during the examination.  If the procedure report does not answer your questions, please call your gastroenterologist to clarify.  If you requested that your care partner not be given the details of your procedure findings, then the procedure report has been included in a sealed envelope for you to review at your convenience later.  YOU SHOULD EXPECT: Some feelings of bloating in the abdomen. Passage of more gas than usual.  Walking can help get rid of the air that was put into your GI tract during the procedure and reduce the bloating. If you had a lower endoscopy (such as a colonoscopy or flexible sigmoidoscopy) you may notice spotting of blood in your stool or on the toilet paper. If you underwent a bowel prep for your procedure, you may not have a normal bowel movement for a few days.  Please Note:  You might notice some irritation and congestion in your nose or some drainage.  This is from the oxygen used during your procedure.  There is no need for concern and it should clear up in a day or so.  SYMPTOMS TO REPORT IMMEDIATELY:  Following lower endoscopy (colonoscopy or flexible sigmoidoscopy):  Excessive amounts of blood in the stool  Significant tenderness or worsening of abdominal pains  Swelling of the abdomen that is new, acute  Fever of 100F or higher  Following upper endoscopy (EGD)  Vomiting of blood or coffee ground material  New chest pain or pain under the shoulder blades  Painful or persistently difficult swallowing  New shortness of breath  Fever of 100F or higher  Black, tarry-looking stools  For urgent  or emergent issues, a gastroenterologist can be reached at any hour by calling [phone]. Do not use MyChart messaging for urgent concerns.   DIET:  We do recommend a small meal at first, but then you may proceed to your regular diet.  Drink plenty of fluids but you should avoid alcoholic beverages for 24 hours.  ACTIVITY:  You should plan to take it easy for the rest of today and you should NOT DRIVE or use heavy machinery until tomorrow (because of the sedation medicines used during the test).    FOLLOW UP: Our staff will call the number listed on your records 48-72 hours following your procedure to check on you and address any questions or concerns that you may have regarding the information given to you following your procedure. If we do not reach you, we will leave a message.  We will attempt to reach you two times.  During this call, we will ask if you have developed any symptoms of COVID 19. If you develop any symptoms (ie: fever, flu-like symptoms, shortness of breath, cough etc.) before then, please call [phone].  If you test positive for Covid 19 in the 2 weeks post procedure, please call and report this information to [place].    If any biopsies were taken you will be contacted by phone or by letter within the next 1-3 weeks.  Please call us at [phone] if you have not heard about the biopsies in 3 weeks.   SIGNATURES/CONFIDENTIALITY: You and/or your care partner  have signed paperwork which will be entered into your electronic medical record.  These signatures attest to the fact that that the information above on your After Visit Summary has been reviewed and is understood.  Full responsibility of the confidentiality of this discharge information lies with you and/or your care-partner.

## 2020-12-23 NOTE — Progress Notes (Signed)
To pacu, VSS. Report to [email or web address] 

## 2020-12-23 NOTE — Op Note (Signed)
[facility] Patient Name: [name] Procedure Date: 12/23/2020 10:33 AM MRN: [id number] Endoscopist: Thornton Park MD, MD Age: 87 Referring MD:  Date of Birth: 1933-09-14 Gender: Female Account #: [id number] Procedure:                Upper GI endoscopy Indications:              Follow-up of intestinal metaplasia, Abdominal                            bloating                           Chronic bloating without alarm features                           Gastritis with focal intestinal metaplasia 2016                           Altered bowel habits Medicines:                Monitored Anesthesia Care Procedure:                Pre-Anesthesia Assessment:                           - Prior to the procedure, a History and Physical                            was performed, and patient medications and                            allergies were reviewed. The patient's tolerance of                            previous anesthesia was also reviewed. The risks                            and benefits of the procedure and the sedation                            options and risks were discussed with the patient.                            All questions were answered, and informed consent                            was obtained. Prior Anticoagulants: The patient has                            taken no previous anticoagulant or antiplatelet                            agents. ASA Grade Assessment: II - A patient with                            mild systemic  disease. After reviewing the risks                            and benefits, the patient was deemed in                            satisfactory condition to undergo the procedure.                           After obtaining informed consent, the endoscope was                            passed under direct vision. Throughout the                            procedure, the patient's blood pressure, pulse, and                            oxygen  saturations were monitored continuously. The                            GIF [id number] was introduced through the                            mouth, and advanced to the third part of duodenum.                            The upper GI endoscopy was accomplished without                            difficulty. The patient tolerated the procedure                            well. Scope In: Scope Out: Findings:                 The examined esophagus was normal. Z-line present                            34 cm from the incisors. No ring, web, or stricture.                           A medium-sized hiatal hernia was present.                           The entire examined stomach was normal. Biopsies                            were taken from the antrum, body, fundus, and                            incisura with a cold forceps for histology.                            Estimated blood loss  was minimal.                           The examined duodenum was normal. Biopsies were                            taken with a cold forceps for histology. Estimated                            blood loss was minimal.                           The cardia and gastric fundus were normal on                            retroflexion.                           The exam was otherwise without abnormality. Complications:            No immediate complications. Estimated blood loss:                            Minimal. Estimated Blood Loss:     Estimated blood loss was minimal. Impression:               - Normal esophagus.                           - Medium-sized hiatal hernia.                           - Normal stomach. Biopsied.                           - Normal examined duodenum. Biopsied.                           - The examination was otherwise normal. Recommendation:           - Patient has a contact number available for                            emergencies. The signs and symptoms of potential                             delayed complications were discussed with the                            patient. Return to normal activities tomorrow.                            Written discharge instructions were provided to the                            patient.                           -  Resume previous diet.                           - Continue present medications.                           - Await pathology results.                           - Proceed with colonoscopy as previously planned Thornton Park MD, MD 12/23/2020 10:46:33 AM This report has been signed electronically.

## 2020-12-23 NOTE — Progress Notes (Signed)
VS by [place]  I have reviewed the patient's medical history in detail and updated the computerized patient record.  

## 2020-12-23 NOTE — Op Note (Addendum)
[facility] Patient Name: [name] Procedure Date: 12/23/2020 10:28 AM MRN: [id number] Endoscopist: Thornton Park MD, MD Age: 87 Referring MD:  Date of Birth: [birth year] Gender: Female Account #: [id number] Procedure:                Colonoscopy Indications:              Change in bowel habits                           Patient reported history of colon polyps and                            colitis, last colonoscopy with Dr. Watt Climes 2013 Medicines:                Monitored Anesthesia Care Procedure:                Pre-Anesthesia Assessment:                           - Prior to the procedure, a History and Physical                            was performed, and patient medications and                            allergies were reviewed. The patient's tolerance of                            previous anesthesia was also reviewed. The risks                            and benefits of the procedure and the sedation                            options and risks were discussed with the patient.                            All questions were answered, and informed consent                            was obtained. Prior Anticoagulants: The patient has                            taken no previous anticoagulant or antiplatelet                            agents. ASA Grade Assessment: II - A patient with                            mild systemic disease. After reviewing the risks                            and benefits, the patient was deemed in  satisfactory condition to undergo the procedure.                           After obtaining informed consent, the colonoscope                            was passed under direct vision. Throughout the                            procedure, the patient's blood pressure, pulse, and                            oxygen saturations were monitored continuously. The                            Olympus CF-HQ190L [id number]) Colonoscope  was                            introduced through the anus and advanced to the the                            cecum, identified by appendiceal orifice and                            ileocecal valve. A second forward view of the right                            colon was performed. The colonoscopy was performed                            without difficulty. The patient tolerated the                            procedure well. The quality of the bowel                            preparation was good except for some fiercely                            adherent stool present in the right colon. The                            ileocecal valve, appendiceal orifice, and rectum                            were photographed. Scope In: 10:48:58 AM Scope Out: 11:01:06 AM Scope Withdrawal Time: 0 hours 8 minutes 11 seconds  Total Procedure Duration: 0 hours 12 minutes 8 seconds  Findings:                 The perianal and digital rectal examinations were                            normal.  A few small-mouthed diverticula were found in the                            sigmoid colon.                           The entired examined colonic mucosa appeared                            normal. Biopsies for histology were taken with a                            cold forceps from the right colon and left colon                            for evaluation of microscopic colitis. Estimated                            blood loss was minimal.                           The exam was otherwise without abnormality on                            direct and retroflexion views. Complications:            No immediate complications. Estimated blood loss:                            Minimal. Estimated Blood Loss:     Estimated blood loss was minimal. Impression:               - Diverticulosis in the sigmoid colon. No                            diverticulitis.                           - The entire examined  colon is normal. No obvious                            colitis. Biopsied.                           - The examination was otherwise normal on direct                            and retroflexion views. Recommendation:           - Patient has a contact number available for                            emergencies. The signs and symptoms of potential                            delayed complications were discussed with the  patient. Return to normal activities tomorrow.                            Written discharge instructions were provided to the                            patient.                           - Resume previous diet. High fiber diet encouraged.                           - Continue present medications.                           - Add a daily dose of Metamucil, Benefiber, or                            Citrucel                           - Await pathology results.                           - Emerging evidence supports eating a diet of                            fruits, vegetables, grains, calcium, and yogurt                            while reducing red meat and alcohol may reduce the                            risk of colon cancer. Thornton Park MD, MD 12/23/2020 11:07:50 AM This report has been signed electronically.

## 2020-12-23 NOTE — Progress Notes (Signed)
Called to room to assist during endoscopic procedure.  Patient ID and intended procedure confirmed with present staff. Received instructions for my participation in the procedure from the performing physician.  

## 2020-12-25 ENCOUNTER — Telehealth: Payer: Self-pay

## 2020-12-25 NOTE — Telephone Encounter (Signed)
  Follow up Call-  Call back number 12/23/2020  Post procedure Call Back phone  # [phone]  Permission to leave phone message Yes  Some recent data might be hidden     Patient questions:  Do you have a fever, pain , or abdominal swelling? No. Pain Score  0 *  Have you tolerated food without any problems? Yes.    Have you been able to return to your normal activities? Yes.    Do you have any questions about your discharge instructions: Diet   No. Medications  No. Follow up visit  No.  Do you have questions or concerns about your Care? No.  Actions: * If pain score is 4 or above: No action needed, pain <4.   Have you developed a fever since your procedure? no  2.   Have you had an respiratory symptoms (SOB or cough) since your procedure? no  3.   Have you tested positive for COVID 19 since your procedure no  4.   Have you had any family members/close contacts diagnosed with the COVID 19 since your procedure?  no   If yes to any of these questions please route to Joylene John, RN and Joella Prince, RN

## 2020-12-30 DIAGNOSIS — M7741 Metatarsalgia, right foot: Secondary | ICD-10-CM | POA: Diagnosis not present

## 2020-12-30 DIAGNOSIS — L84 Corns and callosities: Secondary | ICD-10-CM | POA: Diagnosis not present

## 2021-01-28 ENCOUNTER — Other Ambulatory Visit: Payer: Self-pay

## 2021-01-28 DIAGNOSIS — M79605 Pain in left leg: Secondary | ICD-10-CM

## 2021-01-28 DIAGNOSIS — M79671 Pain in right foot: Secondary | ICD-10-CM

## 2021-01-28 DIAGNOSIS — M79604 Pain in right leg: Secondary | ICD-10-CM

## 2021-02-03 DIAGNOSIS — M6281 Muscle weakness (generalized): Secondary | ICD-10-CM | POA: Diagnosis not present

## 2021-02-03 DIAGNOSIS — M25561 Pain in right knee: Secondary | ICD-10-CM | POA: Diagnosis not present

## 2021-02-03 DIAGNOSIS — M25562 Pain in left knee: Secondary | ICD-10-CM | POA: Diagnosis not present

## 2021-02-04 ENCOUNTER — Ambulatory Visit (INDEPENDENT_AMBULATORY_CARE_PROVIDER_SITE_OTHER): Payer: Medicare Other | Admitting: Vascular Surgery

## 2021-02-04 ENCOUNTER — Other Ambulatory Visit: Payer: Self-pay

## 2021-02-04 ENCOUNTER — Encounter: Payer: Self-pay | Admitting: Vascular Surgery

## 2021-02-04 ENCOUNTER — Ambulatory Visit (HOSPITAL_COMMUNITY)
Admission: RE | Admit: 2021-02-04 | Discharge: 2021-02-04 | Disposition: A | Discharge status: Home / Self Care | Payer: Medicare Other | Source: Ambulatory Visit | Attending: Vascular Surgery | Admitting: Vascular Surgery

## 2021-02-04 VITALS — BP 151/93 | HR 71 | Temp 98.1°F | Resp 20 | Ht 60.0 in | Wt 141.0 lb

## 2021-02-04 DIAGNOSIS — M79671 Pain in right foot: Secondary | ICD-10-CM | POA: Diagnosis not present

## 2021-02-04 DIAGNOSIS — M79604 Pain in right leg: Secondary | ICD-10-CM

## 2021-02-04 DIAGNOSIS — M79605 Pain in left leg: Secondary | ICD-10-CM

## 2021-02-04 DIAGNOSIS — G609 Hereditary and idiopathic neuropathy, unspecified: Secondary | ICD-10-CM

## 2021-02-04 DIAGNOSIS — M79672 Pain in left foot: Secondary | ICD-10-CM | POA: Diagnosis not present

## 2021-02-04 DIAGNOSIS — Z23 Encounter for immunization: Secondary | ICD-10-CM | POA: Diagnosis not present

## 2021-02-04 NOTE — Progress Notes (Signed)
ASSESSMENT & PLAN   NEUROGENIC PAIN BOTH FEET: I reassured her that she has no evidence of arterial insufficiency.  She does have evidence of chronic venous insufficiency but I do not think this would explain her symptoms.  She is had previous laser ablation of her small saphenous veins but her symptoms in her feet were not temporally related to this.  She does have multiple back issues and has had surgery on her neck and lumbar spine.  She plans on following up with Dr. Ellene Route.  The only other consideration would be evaluation by neurology.  We will see her back as needed.  REASON FOR CONSULT:    Burning pain in both feet.  HPI:   [name] is a 87 y.o. female who saw Dr. Tawni Millers on 06/18/2019 with leg pain.  At that time she was having burning the time pain in both feet.  She has had previous small saphenous vein ablation at an outlying vein center.  She is also had previous back surgery.  She was not felt to have any significant arterial or venous pathology.   On my history, I do not get any history of claudication or rest pain.  She does have a long history of chronic venous insufficiency.  Leg elevation does not help her symptoms.  Her main complaint is burning pain in her feet pretty much all the time.  This is been going on for years.  She has had multiple operations on her back including her cervical spine and lumbar spine.  She is not diabetic.  She said no specific injury to her legs.  Past Medical History:  Diagnosis Date   Anxiety    Arthritis    Chronic headaches    Colon polyps    Complication of anesthesia    Elevated blood pressure after having last Kyphoplasty; pt stated "I was given Morphine and had to stay overnight"   Coronary artery disease    DI (detrusor instability)    Fracture of vertebra    x 3   GERD (gastroesophageal reflux disease)    History of hiatal hernia    HLD (hyperlipidemia)    Hypertension    Hypothyroidism    Menopausal symptoms    MGUS  (monoclonal gammopathy of unknown significance) 12/27/2017   IgA   Osteoporosis    Peripheral neuropathy 12/27/2017   Peripheral neuropathy    Pneumonia    Restless leg syndrome    Varicose veins     Family History  Problem Relation Age of Onset   Heart disease Mother    Heart disease Father    Stroke Father    Hypertension Sister    Diabetes Sister    Ovarian cancer Sister    Diabetes Sister    Rectal cancer Sister        ? Colon or rectal   Hyperlipidemia Sister    Hypertension Sister    Hyperlipidemia Sister    Hypertension Sister    Hypertension Brother    Heart disease Brother    Heart disease Brother    Hypertension Brother    Heart disease Brother    Heart disease Brother    Heart disease Brother    Heart disease Brother    Breast cancer Niece    Heart disease Son    Hyperlipidemia Son    Hyperlipidemia Daughter    Hypertension Daughter     SOCIAL HISTORY: Social History   Tobacco Use   Smoking status: Never  Smokeless tobacco: Never  Substance Use Topics   Alcohol use: No    No Known Allergies  Current Outpatient Medications  Medication Sig Dispense Refill   ALPRAZolam (XANAX) 0.5 MG tablet Take 0.5 mg by mouth 2 (two) times daily as needed (restless legs).     amLODipine (NORVASC) 5 MG tablet Take 1 tablet (5 mg total) by mouth daily. 90 tablet 3   Aspirin Buf,CaCarb-MgCarb-MgO, 81 MG TABS Take 81 mg by mouth daily.     calcium carbonate (OS-CAL - DOSED IN MG OF ELEMENTAL CALCIUM) 1250 MG tablet Take 1 tablet by mouth daily.       celecoxib (CELEBREX) 200 MG capsule Take 200 mg by mouth daily as needed for moderate pain.      dexlansoprazole (DEXILANT) 60 MG capsule Take 60 mg by mouth daily.     escitalopram (LEXAPRO) 10 MG tablet Take 10 mg by mouth daily.     levothyroxine (SYNTHROID, LEVOTHROID) 25 MCG tablet Take 25 mcg by mouth daily before breakfast.      MAGNESIUM PO Take 1 tablet by mouth daily.     metoprolol succinate (TOPROL-XL) 25  MG 24 hr tablet Take 25 mg by mouth daily.     Multiple Vitamin (MULTIVITAMIN) tablet Take 1 tablet by mouth daily.       NONFORMULARY OR COMPOUNDED ITEM Transdermal therapeutics  meloxicam 0.5%, doxepin 3%, amantadine 3%, dextromethorphan 2%, lidocaine 2%.     rosuvastatin (CRESTOR) 20 MG tablet Take 20 mg by mouth 2 (two) times a week.     triamterene-hydrochlorothiazide (MAXZIDE-25) 37.5-25 MG per tablet Take 1 tablet by mouth daily.       vitamin C (ASCORBIC ACID) 500 MG tablet Take 1,000 mg by mouth daily.      Vitamin D, Ergocalciferol, (DRISDOL) 50000 UNITS CAPS Take 50,000 Units by mouth every 7 (seven) days.      zolpidem (AMBIEN CR) 12.5 MG CR tablet Take 12.5 mg by mouth at bedtime.     No current facility-administered medications for this visit.    REVIEW OF SYSTEMS:  '[X]'$  denotes positive finding, '[ ]'$  denotes negative finding Cardiac  Comments:  Chest pain or chest pressure:    Shortness of breath upon exertion:    Short of breath when lying flat:    Irregular heart rhythm:        Vascular    Pain in calf, thigh, or hip brought on by ambulation:    Pain in feet at night that wakes you up from your sleep:     Blood clot in your veins:    Leg swelling:         Pulmonary    Oxygen at home:    Productive cough:     Wheezing:         Neurologic    Sudden weakness in arms or legs:     Sudden numbness in arms or legs:     Sudden onset of difficulty speaking or slurred speech:    Temporary loss of vision in one eye:     Problems with dizziness:         Gastrointestinal    Blood in stool:     Vomited blood:         Genitourinary    Burning when urinating:     Blood in urine:        Psychiatric    Major depression:         Hematologic    Bleeding problems:  Problems with blood clotting too easily:        Skin    Rashes or ulcers:        Constitutional    Fever or chills:    -  PHYSICAL EXAM:   Vitals:   02/04/21 1332  BP: (!) 151/93  Pulse: 71   Resp: 20  Temp: 98.1 F (36.7 C)  SpO2: 95%  Weight: 141 lb (64 kg)  Height: 5' (1.524 m)   Body mass index is 27.54 kg/m. GENERAL: The patient is a well-nourished female, in no acute distress. The vital signs are documented above. CARDIAC: There is a regular rate and rhythm.  VASCULAR: I do not detect carotid bruits. She has palpable femoral and pedal pulses.  He She has hyperpigmentation bilaterally consistent with chronic venous insufficiency. She does not have any significant swelling. PULMONARY: There is good air exchange bilaterally without wheezing or rales. ABDOMEN: Soft and non-tender with normal pitched bowel sounds.  MUSCULOSKELETAL: There are no major deformities. NEUROLOGIC: No focal weakness or paresthesias are detected. SKIN: There are no ulcers or rashes noted. PSYCHIATRIC: The patient has a normal affect.  DATA:    ARTERIAL DOPPLER STUDY: I have independently interpreted her arterial Doppler study today.  On the right side there is a triphasic dorsalis pedis and posterior tibial signal.  ABIs 100%.  Toe pressures 122 mmHg.  On the left side there is a triphasic dorsalis pedis and posterior tibial signal.  ABIs 100%.  Toe pressures 132 mmHg.   Deitra Mayo [facility]

## 2021-02-09 ENCOUNTER — Encounter: Payer: Self-pay | Admitting: Gastroenterology

## 2021-02-09 ENCOUNTER — Ambulatory Visit (INDEPENDENT_AMBULATORY_CARE_PROVIDER_SITE_OTHER): Payer: Medicare Other | Admitting: Gastroenterology

## 2021-02-09 VITALS — BP 116/72 | HR 76 | Ht 60.0 in | Wt 140.1 lb

## 2021-02-09 DIAGNOSIS — R194 Change in bowel habit: Secondary | ICD-10-CM | POA: Diagnosis not present

## 2021-02-09 DIAGNOSIS — R140 Abdominal distension (gaseous): Secondary | ICD-10-CM

## 2021-02-09 MED ORDER — BENEFIBER PO POWD: ORAL | 0 refills | Status: DC

## 2021-02-09 MED ORDER — RIFAXIMIN 550 MG PO TABS: 550.0000 mg | ORAL_TABLET | Freq: Three times a day (TID) | ORAL | 0 refills | Status: AC

## 2021-02-09 NOTE — Patient Instructions (Addendum)
It was my pleasure to provide care to you today. Based on our discussion, I am providing you with my recommendations below:  RECOMMENDATION(S):   Avoid artifical sweateners Please STOP Miralax Add daily dose of Benefiber Continue daily probiotic  PRESCRIPTION MEDICATION(S):   We have sent the following medication(s) to your pharmacy:  Xifaxan  NOTE: If your medication(s) requires a PRIOR AUTHORIZATION, we will receive notification from your pharmacy. Once received, the process to submit for approval may take up to 7-10 business days. You will be contacted about any denials we have received from your insurance company as well as alternatives recommended by your provider.  FOLLOW UP:  I would like for you to follow up with me in 4-6 weeks. Please refer to the appointments included within this After Visit Summary  BMI:  If you are age 35 or older, your body mass index should be between 23-30. Your Body mass index is 27.37 kg/m. If this is out of the aforementioned range listed, please consider follow up with your Primary Care Provider.  MY CHART:  The [facility] providers would like to encourage you to use [facility] to communicate with providers for non-urgent requests or questions.  Due to long hold times on the telephone, sending your provider a message by [facility] may be a faster and more efficient way to get a response.  Please allow 48 business hours for a response.  Please remember that this is for non-urgent requests.   Thank you for trusting me with your gastrointestinal care!    Thornton Park, MD, MPH

## 2021-02-09 NOTE — Progress Notes (Signed)
Referring Provider: Burnard Bunting, MD Primary Care Physician:  Burnard Bunting, MD  Chief complaint:  Bloating   IMPRESSION:  Chronic bloating without alarm features  Gastritis with focal intestinal metaplasia 2016 Altered bowel habits - now with fecal soiling and accidents Large hiatal hernia Unexplained elevated Ca-125  Chronic bloating and distension without alarm features. Temporary relief with empiric Xifaxan. With her history of elevated Ca-125 I would favor repeating abdominal imaging. However, the patient feels like this was just performed.   Gastritis with focal intestinal metaplasia on EGD 2016: Gastric mapping negative for intestinal metaplasia. No surveillance indicated at this time.   Altered bowel habits: Add a stool bulking agent. Colonoscopy recommended. Consider anorectal manometry after colonoscopy.  Large hiatal hernia: Associated GERD well-controlled on daily PPI.     PLAN: - Add a daily dose of Benefiber - Avoid artifical sweateners - Continue daily probiotics (Align) - Repeat Xifaxan '550mg'$  TID x 2 weeks - Contrasted cross-sectional imaging given her unexplained elevated Ca-125 if her endoscopic evaluation is negative and symptoms are not improving - Follow-up in 4-6 weeks, earlier if needed   I spent over 30 minutes, including independent review of results, communicating results with the patient directly, face-to-face time with the patient, coordinating care, and ordering studies and medications as appropriate, and documentation.   HPI: SHARMEL BEITEL is a 87 y.o. female who was initially referred by NP Orene Desanctis for evaluation of bloating. Previously seen by Dr. Watt Climes for similar symptoms. History is remarkable for elevated Ca-125 without obvious cause despite evaluation by Gyn Onc. Last seen by Dr. Polly Cobia 09/03/20.  She has IgA Kappa MGUS, peripheral neuropathy followed by neurology, gait disorder, nonobstructive coronary disease, hypothyoridism,  hypertension, and hyperlipidemia. She has  reflux controlled on daily PPI therapy. Had Covid 3 months ago and a recent shoulder repair. Husband of 42 years died 2 years ago.   Reports a many year history of bloating, worsened after mesh procedure for incontinence 1995. Feels distended with a sense of gasiness. Awakes with symptoms that are present all day with an ongoing sense of distention, but, it's worsened after eating. Associated early satiety and nausea.  Symptoms not related to defecation. She has a BM every morning but she frequent fecal soiling with difficulty keeping her perineum clean. Over the course of the day she will pas several small hard BM requiring need for a pad. Not associated with stress. Appetite is good. Weight is up. No associated abdominal pain. No blood or mucous in the stool. Denies constipation or diarrhea.   Follows a diet void of fatty and greasy foods. Does not drink carbonated drinks. No sugar substitutes. No specific medical treatments. On probiotics for years, recently started Align 5 days.    Previously seen by Dr. Watt Climes for similar symptoms.   44 pages of records review from Dr. Watt Climes. Last seen 12/04/19. Colestid may have helped diarrhea and anal leakage. CT 2017 ordered for elevated Ca125. No relief with Librax or probiotics. Abd xray normal 12/04/19. EGD 5/13: hiatal hernia, tortuous esophagus EGD 12/2014: gastritis with focal intestinal metaplasia, no H pylori Colonoscopy with tubular adenoma 2013 Colonoscopy with normal colon biopsies, tubular adenoma 2017  Had EGD and colonoscopy 12/23/20.  EGD showed a medium-sized hiatal hernia. Gastric biopsies showed mild chronic gastritis without intestinal metaplasia. Colonoscopy showed sigmoid diverticulosis and was otherwise normal. Right and left sided colon biopsies were normal.   Returns in follow-up after endoscopy. Some symptomatic relief with 2 weeks of Xifaxan. Felt like her  symptoms returned within a couple of  weeks of completing therapy.  Using Metamucil did not provide any relief. Miralax resulted in increased frequency. Continued to take daily Align.   Prior abdominal imaging: - CT chest/abd/pelvis 10/25/19: large hiatal hernia, nonspecific findings in the lung - Abdominal 2 view x-rays 12/04/19: mild stool burden - PET Scan 01/22/20: large hiatal hernia, colonic diverticulosis  Prior endoscopic history: - 12/20/90 Olevia Perches) colonoscopy: tortuous left colon, diverticulosis - 01/20/93 Olevia Perches) colonoscopy for altered bowel habits: poor prep  - 12/25/97 Olevia Perches) Colonoscopy: melanosis coli  Sister with colon cancer at age 65.   Past Medical History:  Diagnosis Date   Anxiety    Arthritis    Chronic headaches    Colon polyps    Complication of anesthesia    Elevated blood pressure after having last Kyphoplasty; pt stated "I was given Morphine and had to stay overnight"   Coronary artery disease    DI (detrusor instability)    Fracture of vertebra    x 3   GERD (gastroesophageal reflux disease)    History of hiatal hernia    HLD (hyperlipidemia)    Hypertension    Hypothyroidism    Menopausal symptoms    MGUS (monoclonal gammopathy of unknown significance) 12/27/2017   IgA   Osteoporosis    Peripheral neuropathy 12/27/2017   Peripheral neuropathy    Pneumonia    Restless leg syndrome    Varicose veins     Past Surgical History:  Procedure Laterality Date   ABDOMINAL HYSTERECTOMY  1992   TAH,BSO   BLADDER SUSPENSION  2011   CRYOMESH   CARPAL TUNNEL RELEASE Bilateral 1982   CHOLECYSTECTOMY  1992   COLONOSCOPY     EYE SURGERY Bilateral    Cataract removal   KYPHOPLASTY     X 3   LUMBAR LAMINECTOMY WITH COFLEX 1 LEVEL N/A 12/29/2015   Procedure: L3-4 Laminectomy with Coflex;  Surgeon: Kristeen Miss, MD;  Location: [facility];  Service: Neurosurgery;  Laterality: N/A;  L3-4 Laminectomy with coflex   OOPHORECTOMY     BSO   REPLACEMENT TOTAL KNEE Bilateral 2008,  2011   RIGHT  2008, LEFT 2011   REVERSE SHOULDER ARTHROPLASTY Right 01/30/2020   Procedure: REVERSE SHOULDER ARTHROPLASTY;  Surgeon: Nicholes Stairs, MD;  Location: [facility];  Service: Orthopedics;  Laterality: Right;  2.5 hrs   SPINAL FUSION  2011   VERTEBRAL SURGERY     T-8, T-10. T-12  DR Roselee Culver    Current Outpatient Medications  Medication Sig Dispense Refill   ALPRAZolam (XANAX) 0.5 MG tablet Take 0.5 mg by mouth 2 (two) times daily as needed (restless legs).     amLODipine (NORVASC) 5 MG tablet Take 1 tablet (5 mg total) by mouth daily. 90 tablet 3   Aspirin Buf,CaCarb-MgCarb-MgO, 81 MG TABS Take 81 mg by mouth daily.     calcium carbonate (OS-CAL - DOSED IN MG OF ELEMENTAL CALCIUM) 1250 MG tablet Take 1 tablet by mouth daily.       celecoxib (CELEBREX) 200 MG capsule Take 200 mg by mouth daily as needed for moderate pain.      dexlansoprazole (DEXILANT) 60 MG capsule Take 60 mg by mouth daily.     escitalopram (LEXAPRO) 10 MG tablet Take 10 mg by mouth daily.     levothyroxine (SYNTHROID, LEVOTHROID) 25 MCG tablet Take 25 mcg by mouth daily before breakfast.      MAGNESIUM PO Take 1 tablet by mouth daily.  metoprolol succinate (TOPROL-XL) 25 MG 24 hr tablet Take 25 mg by mouth daily.     Multiple Vitamin (MULTIVITAMIN) tablet Take 1 tablet by mouth daily.       NONFORMULARY OR COMPOUNDED ITEM Transdermal therapeutics  meloxicam 0.5%, doxepin 3%, amantadine 3%, dextromethorphan 2%, lidocaine 2%.     rifaximin (XIFAXAN) 550 MG TABS tablet Take 1 tablet (550 mg total) by mouth 3 (three) times daily for 14 days. 42 tablet 0   rosuvastatin (CRESTOR) 20 MG tablet Take 20 mg by mouth 2 (two) times a week.     triamterene-hydrochlorothiazide (MAXZIDE-25) 37.5-25 MG per tablet Take 1 tablet by mouth daily.       vitamin C (ASCORBIC ACID) 500 MG tablet Take 1,000 mg by mouth daily.      Vitamin D, Ergocalciferol, (DRISDOL) 50000 UNITS CAPS Take 50,000 Units by mouth every 7 (seven) days.      Wheat  Dextrin (BENEFIBER) POWD Take 1 daily dose 500 g 0   zolpidem (AMBIEN CR) 12.5 MG CR tablet Take 12.5 mg by mouth at bedtime.     No current facility-administered medications for this visit.    Allergies as of 02/09/2021   (No Known Allergies)    Family History  Problem Relation Age of Onset   Heart disease Mother    Heart disease Father    Stroke Father    Hypertension Sister    Diabetes Sister    Ovarian cancer Sister    Diabetes Sister    Rectal cancer Sister        ? Colon or rectal   Hyperlipidemia Sister    Hypertension Sister    Hyperlipidemia Sister    Hypertension Sister    Hypertension Brother    Heart disease Brother    Heart disease Brother    Hypertension Brother    Heart disease Brother    Heart disease Brother    Heart disease Brother    Heart disease Brother    Breast cancer Niece    Heart disease Son    Hyperlipidemia Son    Hyperlipidemia Daughter    Hypertension Daughter        Physical Exam: General:   Alert,  well-nourished, pleasant and cooperative in NAD Head:  Normocephalic and atraumatic. Eyes:  Sclera clear, no icterus.   Conjunctiva pink. Abdomen:  Soft, nontender, nondistended, normal bowel sounds, no rebound or guarding. No hepatosplenomegaly.  No tympanny.  Extremities:  No clubbing or edema. Neurologic:  Alert and  oriented x4;  grossly nonfocal Skin:  Intact without significant lesions or rashes. Psych:  Alert and cooperative. Normal mood and affect.     Starling Jessie L. Tarri Glenn, MD, MPH 02/12/2021, 2:17 PM

## 2021-02-10 DIAGNOSIS — M25562 Pain in left knee: Secondary | ICD-10-CM | POA: Diagnosis not present

## 2021-02-10 DIAGNOSIS — M25561 Pain in right knee: Secondary | ICD-10-CM | POA: Diagnosis not present

## 2021-02-10 DIAGNOSIS — M6281 Muscle weakness (generalized): Secondary | ICD-10-CM | POA: Diagnosis not present

## 2021-02-12 ENCOUNTER — Encounter: Payer: Self-pay | Admitting: Gastroenterology

## 2021-02-12 DIAGNOSIS — M6281 Muscle weakness (generalized): Secondary | ICD-10-CM | POA: Diagnosis not present

## 2021-02-12 DIAGNOSIS — M25561 Pain in right knee: Secondary | ICD-10-CM | POA: Diagnosis not present

## 2021-02-12 DIAGNOSIS — M25562 Pain in left knee: Secondary | ICD-10-CM | POA: Diagnosis not present

## 2021-02-15 DIAGNOSIS — M25561 Pain in right knee: Secondary | ICD-10-CM | POA: Diagnosis not present

## 2021-02-15 DIAGNOSIS — M6281 Muscle weakness (generalized): Secondary | ICD-10-CM | POA: Diagnosis not present

## 2021-02-15 DIAGNOSIS — M25562 Pain in left knee: Secondary | ICD-10-CM | POA: Diagnosis not present

## 2021-02-19 ENCOUNTER — Ambulatory Visit: Payer: Medicare Other | Admitting: Gastroenterology

## 2021-02-22 DIAGNOSIS — M25561 Pain in right knee: Secondary | ICD-10-CM | POA: Diagnosis not present

## 2021-02-22 DIAGNOSIS — M6281 Muscle weakness (generalized): Secondary | ICD-10-CM | POA: Diagnosis not present

## 2021-02-22 DIAGNOSIS — M25562 Pain in left knee: Secondary | ICD-10-CM | POA: Diagnosis not present

## 2021-02-24 DIAGNOSIS — M5416 Radiculopathy, lumbar region: Secondary | ICD-10-CM | POA: Diagnosis not present

## 2021-02-24 DIAGNOSIS — I10 Essential (primary) hypertension: Secondary | ICD-10-CM | POA: Diagnosis not present

## 2021-03-01 DIAGNOSIS — M25561 Pain in right knee: Secondary | ICD-10-CM | POA: Diagnosis not present

## 2021-03-01 DIAGNOSIS — M25562 Pain in left knee: Secondary | ICD-10-CM | POA: Diagnosis not present

## 2021-03-01 DIAGNOSIS — M6281 Muscle weakness (generalized): Secondary | ICD-10-CM | POA: Diagnosis not present

## 2021-03-05 DIAGNOSIS — H0014 Chalazion left upper eyelid: Secondary | ICD-10-CM | POA: Diagnosis not present

## 2021-03-10 DIAGNOSIS — M5126 Other intervertebral disc displacement, lumbar region: Secondary | ICD-10-CM | POA: Diagnosis not present

## 2021-03-10 DIAGNOSIS — M5416 Radiculopathy, lumbar region: Secondary | ICD-10-CM | POA: Diagnosis not present

## 2021-03-10 DIAGNOSIS — M48061 Spinal stenosis, lumbar region without neurogenic claudication: Secondary | ICD-10-CM | POA: Diagnosis not present

## 2021-03-11 ENCOUNTER — Inpatient Hospital Stay (HOSPITAL_BASED_OUTPATIENT_CLINIC_OR_DEPARTMENT_OTHER): Payer: Medicare Other | Admitting: Hematology & Oncology

## 2021-03-11 ENCOUNTER — Inpatient Hospital Stay: Payer: Medicare Other | Attending: Hematology & Oncology

## 2021-03-11 ENCOUNTER — Encounter: Payer: Self-pay | Admitting: Hematology & Oncology

## 2021-03-11 ENCOUNTER — Other Ambulatory Visit: Payer: Self-pay

## 2021-03-11 VITALS — BP 112/64 | HR 76 | Temp 97.9°F | Resp 20 | Wt 140.1 lb

## 2021-03-11 DIAGNOSIS — Z79899 Other long term (current) drug therapy: Secondary | ICD-10-CM | POA: Insufficient documentation

## 2021-03-11 DIAGNOSIS — M791 Myalgia, unspecified site: Secondary | ICD-10-CM | POA: Diagnosis not present

## 2021-03-11 DIAGNOSIS — D472 Monoclonal gammopathy: Secondary | ICD-10-CM | POA: Insufficient documentation

## 2021-03-11 DIAGNOSIS — R519 Headache, unspecified: Secondary | ICD-10-CM | POA: Insufficient documentation

## 2021-03-11 LAB — CBC WITH DIFFERENTIAL (CANCER CENTER ONLY)
Abs Immature Granulocytes: 0.01 10*3/uL (ref 0.00–0.07)
Basophils Absolute: 0 10*3/uL (ref 0.0–0.1)
Basophils Relative: 1 %
Eosinophils Absolute: 0.1 10*3/uL (ref 0.0–0.5)
Eosinophils Relative: 2 %
HCT: 40.8 % (ref 36.0–46.0)
Hemoglobin: 13.9 g/dL (ref 12.0–15.0)
Immature Granulocytes: 0 %
Lymphocytes Relative: 35 %
Lymphs Abs: 1.7 10*3/uL (ref 0.7–4.0)
MCH: 32.2 pg (ref 26.0–34.0)
MCHC: 34.1 g/dL (ref 30.0–36.0)
MCV: 94.4 fL (ref 80.0–100.0)
Monocytes Absolute: 0.7 10*3/uL (ref 0.1–1.0)
Monocytes Relative: 13 %
Neutro Abs: 2.5 10*3/uL (ref 1.7–7.7)
Neutrophils Relative %: 49 %
Platelet Count: 291 10*3/uL (ref 150–400)
RBC: 4.32 MIL/uL (ref 3.87–5.11)
RDW: 12.5 % (ref 11.5–15.5)
WBC Count: 5 10*3/uL (ref 4.0–10.5)
nRBC: 0 % (ref 0.0–0.2)

## 2021-03-11 LAB — CMP (CANCER CENTER ONLY)
ALT: 20 U/L (ref 0–44)
AST: 23 U/L (ref 15–41)
Albumin: 4.2 g/dL (ref 3.5–5.0)
Alkaline Phosphatase: 70 U/L (ref 38–126)
Anion gap: 7 (ref 5–15)
BUN: 20 mg/dL (ref 8–23)
CO2: 31 mmol/L (ref 22–32)
Calcium: 10.6 mg/dL — ABNORMAL HIGH (ref 8.9–10.3)
Chloride: 95 mmol/L — ABNORMAL LOW (ref 98–111)
Creatinine: 0.96 mg/dL (ref 0.44–1.00)
GFR, Estimated: 58 mL/min — ABNORMAL LOW (ref >60)
Glucose, Bld: 93 mg/dL (ref 70–99)
Potassium: 3.9 mmol/L (ref 3.5–5.1)
Sodium: 133 mmol/L — ABNORMAL LOW (ref 135–145)
Total Bilirubin: 0.6 mg/dL (ref 0.3–1.2)
Total Protein: 6.5 g/dL (ref 6.5–8.1)

## 2021-03-11 LAB — LACTATE DEHYDROGENASE: LDH: 189 U/L (ref 98–192)
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
## 2021-03-12 LAB — KAPPA/LAMBDA LIGHT CHAINS
Kappa free light chain: 37.2 mg/L — ABNORMAL HIGH (ref 3.3–19.4)
Kappa, lambda light chain ratio: 1.81 — ABNORMAL HIGH (ref 0.26–1.65)
Lambda free light chains: 20.5 mg/L (ref 5.7–26.3)

## 2021-03-12 LAB — IGG, IGA, IGM
IgA: 377 mg/dL (ref 64–422)
IgG (Immunoglobin G), Serum: 792 mg/dL (ref 586–1602)
IgM (Immunoglobulin M), Srm: 79 mg/dL (ref 26–217)

## 2021-03-16 DIAGNOSIS — Z6826 Body mass index (BMI) 26.0-26.9, adult: Secondary | ICD-10-CM | POA: Diagnosis not present

## 2021-03-16 DIAGNOSIS — M48062 Spinal stenosis, lumbar region with neurogenic claudication: Secondary | ICD-10-CM | POA: Diagnosis not present

## 2021-03-16 LAB — PROTEIN ELECTROPHORESIS, SERUM, WITH REFLEX
A/G Ratio: 1.4 (ref 0.7–1.7)
Albumin ELP: 3.8 g/dL (ref 2.9–4.4)
Alpha-1-Globulin: 0.2 g/dL (ref 0.0–0.4)
Alpha-2-Globulin: 0.6 g/dL (ref 0.4–1.0)
Beta Globulin: 1 g/dL (ref 0.7–1.3)
Gamma Globulin: 0.9 g/dL (ref 0.4–1.8)
Globulin, Total: 2.7 g/dL (ref 2.2–3.9)
M-Spike, %: 0.2 g/dL — ABNORMAL HIGH
SPEP Interpretation: 0
Total Protein ELP: 6.5 g/dL (ref 6.0–8.5)

## 2021-03-16 LAB — IMMUNOFIXATION REFLEX, SERUM

## 2021-03-16 NOTE — Progress Notes (Signed)
Cardiology Office Note:    Date:  03/17/2021   ID:  [name], DOB [birth year], MRN [id number]  PCP:  Burnard Bunting, MD  Cardiologist:  Sinclair Grooms, MD   Referring MD: Burnard Bunting, MD   Chief Complaint  Patient presents with   Coronary Artery Disease   Hypertension    History of Present Illness:    [name] is a 87 y.o. female with a hx of nonobstructive coronary disease, hypertension, and hyperlipidemia, who is requesting cardiology clearance for upcoming right shoulder arthroplasty and also concerns about elevated BP.  No cardiac complaints today.  2 to 3 months ago she was having some difficulty with labile blood pressure.  She is currently on aggressive therapy for her age with pressures that are below the target of 140/80.  Current regimen can continues to be triamterene HCTZ 37.5/25 mg 1/day, Toprol-XL 25 mg/day, and amlodipine 5 mg/day.    Past Medical History:  Diagnosis Date   Anxiety    Arthritis    Chronic headaches    Colon polyps    Complication of anesthesia    Elevated blood pressure after having last Kyphoplasty; pt stated "I was given Morphine and had to stay overnight"   Coronary artery disease    DI (detrusor instability)    Fracture of vertebra    x 3   GERD (gastroesophageal reflux disease)    History of hiatal hernia    HLD (hyperlipidemia)    Hypertension    Hypothyroidism    Menopausal symptoms    MGUS (monoclonal gammopathy of unknown significance) 12/27/2017   IgA   Osteoporosis    Peripheral neuropathy 12/27/2017   Peripheral neuropathy    Pneumonia    Restless leg syndrome    Varicose veins     Past Surgical History:  Procedure Laterality Date   ABDOMINAL HYSTERECTOMY  1992   TAH,BSO   BLADDER SUSPENSION  2011   CRYOMESH   CARPAL TUNNEL RELEASE Bilateral 1982   CHOLECYSTECTOMY  1992   COLONOSCOPY     EYE SURGERY Bilateral    Cataract removal   KYPHOPLASTY     X 3   LUMBAR LAMINECTOMY WITH COFLEX 1  LEVEL N/A 12/29/2015   Procedure: L3-4 Laminectomy with Coflex;  Surgeon: Kristeen Miss, MD;  Location: [facility];  Service: Neurosurgery;  Laterality: N/A;  L3-4 Laminectomy with coflex   OOPHORECTOMY     BSO   REPLACEMENT TOTAL KNEE Bilateral 2008,  2011   RIGHT 2008, LEFT 2011   REVERSE SHOULDER ARTHROPLASTY Right 01/30/2020   Procedure: REVERSE SHOULDER ARTHROPLASTY;  Surgeon: Nicholes Stairs, MD;  Location: [facility];  Service: Orthopedics;  Laterality: Right;  2.5 hrs   SPINAL FUSION  2011   VERTEBRAL SURGERY     T-8, T-10. T-12  DR Roselee Culver    Current Medications: Current Meds  Medication Sig   ALPRAZolam (XANAX) 0.5 MG tablet Take 0.5 mg by mouth 2 (two) times daily as needed (restless legs).   amLODipine (NORVASC) 5 MG tablet Take 1 tablet (5 mg total) by mouth daily. (Patient taking differently: Take 2.5 mg by mouth in the morning and at bedtime.)   aspirin EC 81 MG tablet Take 81 mg by mouth daily. Swallow whole.   calcium carbonate (OS-CAL - DOSED IN MG OF ELEMENTAL CALCIUM) 1250 MG tablet Take 1 tablet by mouth daily.     celecoxib (CELEBREX) 200 MG capsule Take 200 mg by mouth daily as needed for moderate  pain.   dexlansoprazole (DEXILANT) 60 MG capsule Take 60 mg by mouth daily.   escitalopram (LEXAPRO) 10 MG tablet Take 10 mg by mouth daily.   levothyroxine (SYNTHROID, LEVOTHROID) 25 MCG tablet Take 25 mcg by mouth daily before breakfast.    MAGNESIUM PO Take 1 tablet by mouth daily.   metoprolol succinate (TOPROL-XL) 25 MG 24 hr tablet Take 25 mg by mouth daily.   Multiple Vitamin (MULTIVITAMIN) tablet Take 1 tablet by mouth daily.     NONFORMULARY OR COMPOUNDED ITEM Transdermal therapeutics  meloxicam 0.5%, doxepin 3%, amantadine 3%, dextromethorphan 2%, lidocaine 2%.   rosuvastatin (CRESTOR) 20 MG tablet Take 20 mg by mouth 2 (two) times a week.   triamterene-hydrochlorothiazide (MAXZIDE-25) 37.5-25 MG per tablet Take 1 tablet by mouth daily.     vitamin C  (ASCORBIC ACID) 500 MG tablet Take 1,000 mg by mouth daily.    Vitamin D, Ergocalciferol, (DRISDOL) 50000 UNITS CAPS Take 50,000 Units by mouth every 7 (seven) days.    Wheat Dextrin (BENEFIBER) POWD Take 1 daily dose   zolpidem (AMBIEN CR) 12.5 MG CR tablet Take 12.5 mg by mouth at bedtime.     Allergies:   Patient has no known allergies.   Social History   Socioeconomic History   Marital status: Widowed    Spouse name: Not on file   Number of children: 2   Years of education: 36   Highest education level: Not on file  Occupational History   Occupation: Retired  Tobacco Use   Smoking status: Never   Smokeless tobacco: Never  Vaping Use   Vaping Use: Never used  Substance and Sexual Activity   Alcohol use: No   Drug use: No   Sexual activity: Yes    Birth control/protection: Surgical    Comment: Hysterectomy  Other Topics Concern   Not on file  Social History Narrative   Lives  alone   Caffeine use: decaf only   Right handed    Social Determinants of Health   Financial Resource Strain: Not on file  Food Insecurity: Not on file  Transportation Needs: Not on file  Physical Activity: Not on file  Stress: Not on file  Social Connections: Not on file     Family History: The patient's family history includes Breast cancer in her niece; Diabetes in her sister and sister; Heart disease in her brother, brother, brother, brother, brother, brother, father, mother, and son; Hyperlipidemia in her daughter, sister, sister, and son; Hypertension in her brother, brother, daughter, sister, sister, and sister; Ovarian cancer in her sister; Rectal cancer in her sister; Stroke in her father.  ROS:   Please see the history of present illness.    Denies orthopnea, PND, ankle swelling, fainting, and dizziness.  Does have bilateral leg pain when she stands and when she walks.  Has had PVD evaluation without evidence of obstructive disease.  All other systems reviewed and are  negative.  EKGs/Labs/Other Studies Reviewed:    The following studies were reviewed today: No cardiac imaging  EKG:  EKG normal sinus rhythm, PAC, left atrial abnormality, left anterior hemiblock.  No changes noted when compared to January 07, 2020  Recent Labs: 03/11/2021: ALT 20; BUN 20; Creatinine 0.96; Hemoglobin 13.9; Platelet Count 291; Potassium 3.9; Sodium 133  Recent Lipid Panel    Component Value Date/Time   CHOL 241 (H) 11/05/2020 0953   TRIG 118 11/05/2020 0953   HDL 87 11/05/2020 0953   CHOLHDL 2.8 11/05/2020 6503  VLDL 24 11/05/2020 0953   LDLCALC 130 (H) 11/05/2020 0953    Physical Exam:    VS:  BP 98/70   Pulse 81   Ht 5' (1.524 m)   Wt 140 lb 9.6 oz (63.8 kg)   SpO2 95%   BMI 27.46 kg/m     Wt Readings from Last 3 Encounters:  03/17/21 140 lb 9.6 oz (63.8 kg)  03/11/21 140 lb 1.9 oz (63.6 kg)  02/09/21 140 lb 2 oz (63.6 kg)  Blood pressures were repeated in both arms: Right arm 125/70 mmHg; left arm 128/78 mmHg, sitting.  GEN: Compatible with age. No acute distress HEENT: Normal NECK: No JVD. LYMPHATICS: No lymphadenopathy CARDIAC: No murmur. RRR no gallop, or edema. VASCULAR:  Normal Pulses. No bruits. RESPIRATORY:  Clear to auscultation without rales, wheezing or rhonchi  ABDOMEN: Soft, non-tender, non-distended, No pulsatile mass, MUSCULOSKELETAL: No deformity  SKIN: Warm and dry NEUROLOGIC:  Alert and oriented x 3 PSYCHIATRIC:  Normal affect   ASSESSMENT:    1. Coronary artery disease involving native coronary artery of native heart without angina pectoris   2. Essential hypertension   3. Other hyperlipidemia    PLAN:    In order of problems listed above:  Nonobstructive coronary disease.  Continue preventive therapy.  Target LDL should be less than 70.  Currently on Crestor 20 mg/day dose is not changed at this time. Target blood pressure for her age category is 140/80 mmHg. Continue Crestor 20 mg/day  Overall education and  awareness concerning primary risk prevention was discussed in detail: LDL less than 70, hemoglobin A1c less than 7, blood pressure target less than 130/80 mmHg, >150 minutes of moderate aerobic activity per week, avoidance of smoking, weight control (via diet and exercise), and continued surveillance/management of/for obstructive sleep apnea.    Medication Adjustments/Labs and Tests Ordered: Current medicines are reviewed at length with the patient today.  Concerns regarding medicines are outlined above.  No orders of the defined types were placed in this encounter.  No orders of the defined types were placed in this encounter.   There are no Patient Instructions on file for this visit.   Signed, Sinclair Grooms, MD  03/17/2021 3:48 PM    [place]

## 2021-03-17 ENCOUNTER — Encounter: Payer: Self-pay | Admitting: Interventional Cardiology

## 2021-03-17 ENCOUNTER — Other Ambulatory Visit: Payer: Self-pay

## 2021-03-17 ENCOUNTER — Telehealth: Payer: Self-pay

## 2021-03-17 ENCOUNTER — Ambulatory Visit (INDEPENDENT_AMBULATORY_CARE_PROVIDER_SITE_OTHER): Payer: Medicare Other | Admitting: Interventional Cardiology

## 2021-03-17 VITALS — BP 98/70 | HR 81 | Ht 60.0 in | Wt 140.6 lb

## 2021-03-17 DIAGNOSIS — E7849 Other hyperlipidemia: Secondary | ICD-10-CM

## 2021-03-17 DIAGNOSIS — I2510 Atherosclerotic heart disease of native coronary artery without angina pectoris: Secondary | ICD-10-CM | POA: Diagnosis not present

## 2021-03-17 DIAGNOSIS — I10 Essential (primary) hypertension: Secondary | ICD-10-CM | POA: Diagnosis not present

## 2021-03-17 NOTE — Telephone Encounter (Signed)
-----   Message from [name], MD sent at 03/17/2021  8:11 AM EDT ----- Call - the myeloma protein is very low and stable!!  [name]

## 2021-03-17 NOTE — Telephone Encounter (Signed)
Called and informed patient of lab results, patient verbalized understanding and denies any questions or concerns at this time.   

## 2021-03-17 NOTE — Patient Instructions (Signed)

## 2021-03-23 DIAGNOSIS — M5416 Radiculopathy, lumbar region: Secondary | ICD-10-CM | POA: Diagnosis not present

## 2021-03-23 DIAGNOSIS — M5116 Intervertebral disc disorders with radiculopathy, lumbar region: Secondary | ICD-10-CM | POA: Diagnosis not present

## 2021-03-26 ENCOUNTER — Encounter: Payer: Self-pay | Admitting: Gastroenterology

## 2021-03-26 ENCOUNTER — Ambulatory Visit (INDEPENDENT_AMBULATORY_CARE_PROVIDER_SITE_OTHER): Payer: Medicare Other | Admitting: Gastroenterology

## 2021-03-26 VITALS — BP 124/66 | HR 84 | Ht 60.0 in | Wt 137.4 lb

## 2021-03-26 DIAGNOSIS — R159 Full incontinence of feces: Secondary | ICD-10-CM | POA: Diagnosis not present

## 2021-03-26 DIAGNOSIS — R194 Change in bowel habit: Secondary | ICD-10-CM

## 2021-03-26 DIAGNOSIS — R140 Abdominal distension (gaseous): Secondary | ICD-10-CM | POA: Diagnosis not present

## 2021-03-26 DIAGNOSIS — R971 Elevated cancer antigen 125 [CA 125]: Secondary | ICD-10-CM

## 2021-03-26 NOTE — Progress Notes (Signed)
Referring Provider: Burnard Bunting, MD Primary Care Physician:  Burnard Bunting, MD  Chief complaint:  Fecal leakage   IMPRESSION:  Fecal incontinence not explained by recent colonoscopy and random colon biopsies Chronic bloating without alarm features, improved after 2nd round of Xifaxan Gastritis with focal intestinal metaplasia 2016 Large hiatal hernia Unexplained elevated Ca-125  Fecal incontinence not explained by recent colonoscopy and random colon biopsies: Proceed with CT scan given the concurrent elevated Ca-125, stool bulking, and anorectal manometry. She may ultimately need biofeedback +/- pelvic floor physical therapy.   Chronic bloating and distension without alarm features. Relieved after second course of empiric Xifaxan. With her history of elevated Ca-125 I would favor repeating abdominal imaging.   Gastritis with focal intestinal metaplasia on EGD 2016: Gastric mapping negative for intestinal metaplasia. No surveillance indicated at this time.   Large hiatal hernia: Associated GERD well-controlled on daily PPI.     PLAN: - Continue a daily dose of Benefiber - Avoid artifical sweateners - Continue daily probiotics (Align) - Contrasted cross-sectional imaging given her unexplained elevated Ca-125 and ongoing symptoms - Anorectal manometry +/- referral to [facility] for biofeedback - Consider repeat trial of Xifaxan if diarrhea improves - Follow-up after anorectal manometry, earlier if needed   I spent over 30 minutes, including independent review of results, communicating results with the patient directly, face-to-face time with the patient, coordinating care, and ordering studies and medications as appropriate, and documentation.   HPI: ANABELEN KAMINSKY is a 87 y.o. female who was initially referred by NP Orene Desanctis for evaluation of bloating. Previously seen by Dr. Watt Climes for similar symptoms. History is remarkable for elevated Ca-125 without obvious cause despite  evaluation by Gyn Onc. Last seen by Dr. Polly Cobia 09/03/20.  She has IgA Kappa MGUS, peripheral neuropathy followed by neurology, gait disorder, nonobstructive coronary disease, hypothyoridism, hypertension, and hyperlipidemia. She has  reflux controlled on daily PPI therapy. Had Covid 3 months ago and a recent shoulder repair. Husband of 20 years died 2 years ago.   Reports a many year history of bloating, worsened after mesh procedure for incontinence 1995. Feels distended with a sense of gasiness. Awakes with symptoms that are present all day with an ongoing sense of distention, but, it's worsened after eating. Associated early satiety and nausea.  Symptoms not related to defecation. She has a BM every morning but she frequent fecal soiling with difficulty keeping her perineum clean. Over the course of the day she will pas several small hard BM requiring need for a pad. Not associated with stress. Appetite is good. Weight is up. No associated abdominal pain. No blood or mucous in the stool. Denies constipation or diarrhea.   Previously seen by Dr. Watt Climes for similar symptoms.   48 pages of records review from Dr. Watt Climes. Last seen 12/04/19. Colestid may have helped diarrhea and anal leakage. CT 2017 ordered for elevated Ca125. No relief with Librax or probiotics. Abd xray normal 12/04/19. EGD 5/13: hiatal hernia, tortuous esophagus EGD 12/2014: gastritis with focal intestinal metaplasia, no H pylori Colonoscopy with tubular adenoma 2013 Colonoscopy with normal colon biopsies, tubular adenoma 2017  Had EGD and colonoscopy 12/23/20.  EGD showed a medium-sized hiatal hernia. Gastric biopsies showed mild chronic gastritis without intestinal metaplasia. Colonoscopy showed sigmoid diverticulosis and was otherwise normal. Right and left sided colon biopsies were normal.   Returns in follow-up. A second round of Xifaxan x 14 days improved her diarrhea, however, she continues to have some leaking after a BM. She  will  have a BM every day to every day. The leakage has improved with defecation, but, she continues to wear a pantyliner because of liquid and semi-solid stool that she passes without her knowledge. She has a sense of incomplete evacuation with defecation. Also has ongoing borborygmi that she feels is too loud. No blood or mucous in the stool.  There is no constipation.   Using Metamucil did not provide any relief. Miralax resulted in increased frequency. Continued to take daily Align.   Symptoms are not associated with specific foods or physical activities. Follows a diet void of fatty and greasy foods. Does not drink carbonated drinks. No sugar substitutes. No specific medical treatments.    Prior abdominal imaging: - CT chest/abd/pelvis 10/25/19: large hiatal hernia, nonspecific findings in the lung - Abdominal 2 view x-rays 12/04/19: mild stool burden - PET Scan 01/22/20: large hiatal hernia, colonic diverticulosis  Prior endoscopic history: - 12/20/90 Ole[facility] Perches) colonoscopy: tortuous left colon, diverticulosis - 01/20/93 Olevia Perches) colonoscopy for altered bowel habits: poor prep  - 12/25/97 Olevia Perches) Colonoscopy: melanosis coli  Sister with colon cancer at age 67.   Past Medical History:  Diagnosis Date   Anxiety    Arthritis    Chronic headaches    Colon polyps    Complication of anesthesia    Elevated blood pressure after having last Kyphoplasty; pt stated "I was given Morphine and had to stay overnight"   Coronary artery disease    DI (detrusor instability)    Fracture of vertebra    x 3   GERD (gastroesophageal reflux disease)    History of hiatal hernia    HLD (hyperlipidemia)    Hypertension    Hypothyroidism    Menopausal symptoms    MGUS (monoclonal gammopathy of unknown significance) 12/27/2017   IgA   Osteoporosis    Peripheral neuropathy 12/27/2017   Peripheral neuropathy    Pneumonia    Restless leg syndrome    Varicose veins     Past Surgical History:  Procedure  Laterality Date   ABDOMINAL HYSTERECTOMY  1992   TAH,BSO   BLADDER SUSPENSION  2011   CRYOMESH   CARPAL TUNNEL RELEASE Bilateral 1982   CHOLECYSTECTOMY  1992   COLONOSCOPY     EYE SURGERY Bilateral    Cataract removal   KYPHOPLASTY     X 3   LUMBAR LAMINECTOMY WITH COFLEX 1 LEVEL N/A 12/29/2015   Procedure: L3-4 Laminectomy with Coflex;  Surgeon: Kristeen Miss, MD;  Location: [facility];  Service: Neurosurgery;  Laterality: N/A;  L3-4 Laminectomy with coflex   OOPHORECTOMY     BSO   REPLACEMENT TOTAL KNEE Bilateral 2008,  2011   RIGHT 2008, LEFT 2011   REVERSE SHOULDER ARTHROPLASTY Right 01/30/2020   Procedure: REVERSE SHOULDER ARTHROPLASTY;  Surgeon: Nicholes Stairs, MD;  Location: [facility];  Service: Orthopedics;  Laterality: Right;  2.5 hrs   SPINAL FUSION  2011   VERTEBRAL SURGERY     T-8, T-10. T-12  DR Roselee Culver    Current Outpatient Medications  Medication Sig Dispense Refill   ALPRAZolam (XANAX) 0.5 MG tablet Take 0.5 mg by mouth 2 (two) times daily as needed (restless legs).     amLODipine (NORVASC) 5 MG tablet Take 1 tablet (5 mg total) by mouth daily. (Patient taking differently: Take 2.5 mg by mouth in the morning and at bedtime.) 90 tablet 3   aspirin EC 81 MG tablet Take 81 mg by mouth daily. Swallow whole.  calcium carbonate (OS-CAL - DOSED IN MG OF ELEMENTAL CALCIUM) 1250 MG tablet Take 1 tablet by mouth daily.       celecoxib (CELEBREX) 200 MG capsule Take 200 mg by mouth daily as needed for moderate pain.     dexlansoprazole (DEXILANT) 60 MG capsule Take 60 mg by mouth daily.     escitalopram (LEXAPRO) 10 MG tablet Take 10 mg by mouth daily.     levothyroxine (SYNTHROID, LEVOTHROID) 25 MCG tablet Take 25 mcg by mouth daily before breakfast.      MAGNESIUM PO Take 1 tablet by mouth daily.     metoprolol succinate (TOPROL-XL) 25 MG 24 hr tablet Take 25 mg by mouth daily.     Multiple Vitamin (MULTIVITAMIN) tablet Take 1 tablet by mouth daily.        NONFORMULARY OR COMPOUNDED ITEM Transdermal therapeutics  meloxicam 0.5%, doxepin 3%, amantadine 3%, dextromethorphan 2%, lidocaine 2%.     rosuvastatin (CRESTOR) 20 MG tablet Take 20 mg by mouth 2 (two) times a week.     triamterene-hydrochlorothiazide (MAXZIDE-25) 37.5-25 MG per tablet Take 1 tablet by mouth daily.       vitamin C (ASCORBIC ACID) 500 MG tablet Take 1,000 mg by mouth daily.      Vitamin D, Ergocalciferol, (DRISDOL) 50000 UNITS CAPS Take 50,000 Units by mouth every 7 (seven) days.      Wheat Dextrin (BENEFIBER) POWD Take 1 daily dose 500 g 0   zolpidem (AMBIEN CR) 12.5 MG CR tablet Take 12.5 mg by mouth at bedtime.     No current facility-administered medications for this visit.    Allergies as of 03/26/2021   (No Known Allergies)    Family History  Problem Relation Age of Onset   Heart disease Mother    Heart disease Father    Stroke Father    Hypertension Sister    Diabetes Sister    Ovarian cancer Sister    Diabetes Sister    Rectal cancer Sister        ? Colon or rectal   Hyperlipidemia Sister    Hypertension Sister    Hyperlipidemia Sister    Hypertension Sister    Hypertension Brother    Heart disease Brother    Heart disease Brother    Hypertension Brother    Heart disease Brother    Heart disease Brother    Heart disease Brother    Heart disease Brother    Breast cancer Niece    Heart disease Son    Hyperlipidemia Son    Hyperlipidemia Daughter    Hypertension Daughter        Physical Exam: General:   Alert,  well-nourished, pleasant and cooperative in NAD Head:  Normocephalic and atraumatic. Eyes:  Sclera clear, no icterus.   Conjunctiva pink. Abdomen:  Soft, nontender, nondistended, normal bowel sounds, no rebound or guarding. No hepatosplenomegaly.  No tympanny.  Extremities:  No clubbing or edema. Neurologic:  Alert and  oriented x4;  grossly nonfocal Skin:  Intact without significant lesions or rashes. Psych:  Alert and  cooperative. Normal mood and affect.     Angle Karel L. Tarri Glenn, MD, MPH 03/26/2021, 5:02 PM

## 2021-03-26 NOTE — Patient Instructions (Signed)
It was a pleasure to see you today.   Continue to use Benefiber and Align every day.  I have recommended a CT scan for further evaluation.  I think you should consider anorectal manometry to figure out if this is contributing to your constipation. Those results can guide physical therapy to help retrain your pelvic floor muscles into working better to have a bowel movement.   Let me know if you diarrhea returns. We would try another round of antibiotics at that time.   I'd like to see you back after your anorectal manometry. However, please let me know if you need anything prior to that time.   You have been scheduled for a CT scan of the abdomen and pelvis at [facility], 1st floor Radiology. You are scheduled on 04-02-21  at 3pm. You should arrive 15 minutes prior to your appointment time for registration.  Please pick up 2 bottles of contrast from [place] at least 3 days prior to your scan. The solution may taste better if refrigerated, but do NOT add ice or any other liquid to this solution. Shake well before drinking.   Please follow the written instructions below on the day of your exam:   1) Do not eat anything after 11am (4 hours prior to your test)   2) Drink 1 bottle of contrast @ 1pm (2 hours prior to your exam)  Remember to shake well before drinking and do NOT pour over ice.     Drink 1 bottle of contrast @ 2pm (1 hour prior to your exam)   You may take any medications as prescribed with a small amount of water, if necessary. If you take any of the following medications: METFORMIN, GLUCOPHAGE, GLUCOVANCE, AVANDAMET, RIOMET, FORTAMET, [place] MET, JANUMET, GLUMETZA or METAGLIP, you MAY be asked to HOLD this medication 48 hours AFTER the exam.   The purpose of you drinking the oral contrast is to aid in the visualization of your intestinal tract. The contrast solution may cause some diarrhea. Depending on your individual set of symptoms, you may also receive an  intravenous injection of x-ray contrast/dye. Plan on being at [facility] for 45 minutes or longer, depending on the type of exam you are having performed.   If you have any questions regarding your exam or if you need to reschedule, you may call [facility] at [phone] between the hours of 8:00 am and 5:00 pm, Monday-Friday.    You have been scheduled to have an anorectal manometry at [facility] on 06/23/2021 at 12:30pm. Please arrive 30 minutes prior to your appointment time for registration (1st floor of the hospital-admissions).  Please make certain to use 1 Fleets enema 2 hours prior to coming for your appointment. You can purchase Fleets enemas from the laxative section at your drug store. You should not eat anything during the two hours prior to the procedure. You may take regular medications with small sips of water at least 2 hours prior to the study.  Anorectal manometry is a test performed to evaluate patients with constipation or fecal incontinence. This test measures the pressures of the anal sphincter muscles, the sensation in the rectum, and the neural reflexes that are needed for normal bowel movements.  THE PROCEDURE The test takes approximately 30 minutes to 1 hour. You will be asked to change into a hospital gown. A technician or nurse will explain the procedure to you, take a brief health history, and answer any questions you  may have. The patient then lies on his or her left side. A small, flexible tube, about the size of a thermometer, with a balloon at the end is inserted into the rectum. The catheter is connected to a machine that measures the pressure. During the test, the small balloon attached to the catheter may be inflated in the rectum to assess the normal reflex pathways. The nurse or technician may also ask the person to squeeze, relax, and push at various times. The anal sphincter muscle pressures are measured during each of these maneuvers. To  squeeze, the patient tightens the sphincter muscles as if trying to prevent anything from coming out. To push or bear down, the patient strains down as if trying to have a bowel movement.   Due to recent changes in healthcare laws, you may see the results of your imaging and laboratory studies on MyChart before your provider has had a chance to review them.  We understand that in some cases there may be results that are confusing or concerning to you. Not all laboratory results come back in the same time frame and the provider may be waiting for multiple results in order to interpret others.  Please give [place] 48 hours in order for your provider to thoroughly review all the results before contacting the office for clarification of your results.

## 2021-03-30 DIAGNOSIS — I639 Cerebral infarction, unspecified: Secondary | ICD-10-CM

## 2021-03-30 HISTORY — DX: Cerebral infarction, unspecified: I63.9

## 2021-04-01 ENCOUNTER — Other Ambulatory Visit: Payer: Self-pay

## 2021-04-01 ENCOUNTER — Ambulatory Visit (HOSPITAL_COMMUNITY)
Admission: RE | Admit: 2021-04-01 | Discharge: 2021-04-01 | Disposition: A | Discharge status: Home / Self Care | Payer: Medicare Other | Source: Ambulatory Visit | Attending: Gastroenterology | Admitting: Gastroenterology

## 2021-04-01 DIAGNOSIS — R140 Abdominal distension (gaseous): Secondary | ICD-10-CM | POA: Insufficient documentation

## 2021-04-01 DIAGNOSIS — K5710 Diverticulosis of small intestine without perforation or abscess without bleeding: Secondary | ICD-10-CM | POA: Diagnosis not present

## 2021-04-01 DIAGNOSIS — S22080A Wedge compression fracture of T11-T12 vertebra, initial encounter for closed fracture: Secondary | ICD-10-CM | POA: Diagnosis not present

## 2021-04-01 DIAGNOSIS — R194 Change in bowel habit: Secondary | ICD-10-CM | POA: Diagnosis not present

## 2021-04-01 DIAGNOSIS — R971 Elevated cancer antigen 125 [CA 125]: Secondary | ICD-10-CM | POA: Insufficient documentation

## 2021-04-01 DIAGNOSIS — R159 Full incontinence of feces: Secondary | ICD-10-CM | POA: Diagnosis not present

## 2021-04-01 DIAGNOSIS — K449 Diaphragmatic hernia without obstruction or gangrene: Secondary | ICD-10-CM | POA: Diagnosis not present

## 2021-04-01 DIAGNOSIS — K5730 Diverticulosis of large intestine without perforation or abscess without bleeding: Secondary | ICD-10-CM | POA: Diagnosis not present

## 2021-04-01 MED ORDER — IOHEXOL 350 MG/ML SOLN
80.0000 mL | Freq: Once | INTRAVENOUS | Status: AC | PRN
  Administered 2021-04-01: 80 mL via INTRAVENOUS

## 2021-04-02 ENCOUNTER — Ambulatory Visit (HOSPITAL_COMMUNITY): Admission: RE | Admit: 2021-04-02 | Payer: Medicare Other | Source: Ambulatory Visit

## 2021-04-19 ENCOUNTER — Inpatient Hospital Stay (HOSPITAL_BASED_OUTPATIENT_CLINIC_OR_DEPARTMENT_OTHER)
Admission: EM | Admit: 2021-04-19 | Discharge: 2021-04-22 | DRG: 065 | Disposition: A | Discharge status: Home Health | Payer: Medicare Other | Attending: Internal Medicine | Admitting: Internal Medicine

## 2021-04-19 ENCOUNTER — Emergency Department (HOSPITAL_COMMUNITY): Payer: Medicare Other

## 2021-04-19 ENCOUNTER — Emergency Department (HOSPITAL_BASED_OUTPATIENT_CLINIC_OR_DEPARTMENT_OTHER): Payer: Medicare Other

## 2021-04-19 ENCOUNTER — Encounter (HOSPITAL_BASED_OUTPATIENT_CLINIC_OR_DEPARTMENT_OTHER): Payer: Self-pay

## 2021-04-19 ENCOUNTER — Other Ambulatory Visit: Payer: Self-pay

## 2021-04-19 ENCOUNTER — Emergency Department (HOSPITAL_BASED_OUTPATIENT_CLINIC_OR_DEPARTMENT_OTHER): Payer: Medicare Other | Admitting: Radiology

## 2021-04-19 DIAGNOSIS — Z823 Family history of stroke: Secondary | ICD-10-CM

## 2021-04-19 DIAGNOSIS — M1990 Unspecified osteoarthritis, unspecified site: Secondary | ICD-10-CM | POA: Diagnosis present

## 2021-04-19 DIAGNOSIS — K449 Diaphragmatic hernia without obstruction or gangrene: Secondary | ICD-10-CM | POA: Diagnosis present

## 2021-04-19 DIAGNOSIS — I2510 Atherosclerotic heart disease of native coronary artery without angina pectoris: Secondary | ICD-10-CM | POA: Diagnosis not present

## 2021-04-19 DIAGNOSIS — E039 Hypothyroidism, unspecified: Secondary | ICD-10-CM | POA: Diagnosis not present

## 2021-04-19 DIAGNOSIS — K5900 Constipation, unspecified: Secondary | ICD-10-CM | POA: Diagnosis not present

## 2021-04-19 DIAGNOSIS — Z96611 Presence of right artificial shoulder joint: Secondary | ICD-10-CM | POA: Diagnosis present

## 2021-04-19 DIAGNOSIS — R52 Pain, unspecified: Secondary | ICD-10-CM

## 2021-04-19 DIAGNOSIS — H9190 Unspecified hearing loss, unspecified ear: Secondary | ICD-10-CM | POA: Diagnosis not present

## 2021-04-19 DIAGNOSIS — M545 Low back pain, unspecified: Secondary | ICD-10-CM | POA: Diagnosis not present

## 2021-04-19 DIAGNOSIS — Z9049 Acquired absence of other specified parts of digestive tract: Secondary | ICD-10-CM

## 2021-04-19 DIAGNOSIS — Z8673 Personal history of transient ischemic attack (TIA), and cerebral infarction without residual deficits: Secondary | ICD-10-CM

## 2021-04-19 DIAGNOSIS — E871 Hypo-osmolality and hyponatremia: Secondary | ICD-10-CM | POA: Diagnosis not present

## 2021-04-19 DIAGNOSIS — I8390 Asymptomatic varicose veins of unspecified lower extremity: Secondary | ICD-10-CM | POA: Diagnosis present

## 2021-04-19 DIAGNOSIS — M810 Age-related osteoporosis without current pathological fracture: Secondary | ICD-10-CM | POA: Diagnosis not present

## 2021-04-19 DIAGNOSIS — S199XXA Unspecified injury of neck, initial encounter: Secondary | ICD-10-CM | POA: Diagnosis not present

## 2021-04-19 DIAGNOSIS — Y92009 Unspecified place in unspecified non-institutional (private) residence as the place of occurrence of the external cause: Secondary | ICD-10-CM | POA: Diagnosis not present

## 2021-04-19 DIAGNOSIS — R29701 NIHSS score 1: Secondary | ICD-10-CM | POA: Diagnosis not present

## 2021-04-19 DIAGNOSIS — R6889 Other general symptoms and signs: Secondary | ICD-10-CM | POA: Diagnosis not present

## 2021-04-19 DIAGNOSIS — I6310 Cerebral infarction due to embolism of unspecified precerebral artery: Secondary | ICD-10-CM | POA: Diagnosis not present

## 2021-04-19 DIAGNOSIS — M5416 Radiculopathy, lumbar region: Secondary | ICD-10-CM | POA: Diagnosis not present

## 2021-04-19 DIAGNOSIS — E876 Hypokalemia: Secondary | ICD-10-CM

## 2021-04-19 DIAGNOSIS — S41112A Laceration without foreign body of left upper arm, initial encounter: Secondary | ICD-10-CM | POA: Diagnosis present

## 2021-04-19 DIAGNOSIS — D472 Monoclonal gammopathy: Secondary | ICD-10-CM | POA: Diagnosis present

## 2021-04-19 DIAGNOSIS — E785 Hyperlipidemia, unspecified: Secondary | ICD-10-CM | POA: Diagnosis present

## 2021-04-19 DIAGNOSIS — Z96653 Presence of artificial knee joint, bilateral: Secondary | ICD-10-CM | POA: Diagnosis present

## 2021-04-19 DIAGNOSIS — Z83438 Family history of other disorder of lipoprotein metabolism and other lipidemia: Secondary | ICD-10-CM | POA: Diagnosis not present

## 2021-04-19 DIAGNOSIS — E860 Dehydration: Secondary | ICD-10-CM | POA: Diagnosis present

## 2021-04-19 DIAGNOSIS — G4700 Insomnia, unspecified: Secondary | ICD-10-CM | POA: Diagnosis not present

## 2021-04-19 DIAGNOSIS — I10 Essential (primary) hypertension: Secondary | ICD-10-CM | POA: Diagnosis present

## 2021-04-19 DIAGNOSIS — Z981 Arthrodesis status: Secondary | ICD-10-CM

## 2021-04-19 DIAGNOSIS — Z743 Need for continuous supervision: Secondary | ICD-10-CM | POA: Diagnosis not present

## 2021-04-19 DIAGNOSIS — S0990XA Unspecified injury of head, initial encounter: Secondary | ICD-10-CM | POA: Diagnosis not present

## 2021-04-19 DIAGNOSIS — G629 Polyneuropathy, unspecified: Secondary | ICD-10-CM | POA: Diagnosis present

## 2021-04-19 DIAGNOSIS — Z8249 Family history of ischemic heart disease and other diseases of the circulatory system: Secondary | ICD-10-CM | POA: Diagnosis not present

## 2021-04-19 DIAGNOSIS — Z86010 Personal history of colonic polyps: Secondary | ICD-10-CM

## 2021-04-19 DIAGNOSIS — Z7982 Long term (current) use of aspirin: Secondary | ICD-10-CM

## 2021-04-19 DIAGNOSIS — W1839XA Other fall on same level, initial encounter: Secondary | ICD-10-CM | POA: Diagnosis present

## 2021-04-19 DIAGNOSIS — G8929 Other chronic pain: Secondary | ICD-10-CM | POA: Diagnosis present

## 2021-04-19 DIAGNOSIS — R29898 Other symptoms and signs involving the musculoskeletal system: Secondary | ICD-10-CM | POA: Diagnosis present

## 2021-04-19 DIAGNOSIS — G8314 Monoplegia of lower limb affecting left nondominant side: Secondary | ICD-10-CM | POA: Diagnosis present

## 2021-04-19 DIAGNOSIS — M25562 Pain in left knee: Secondary | ICD-10-CM | POA: Diagnosis not present

## 2021-04-19 DIAGNOSIS — G2581 Restless legs syndrome: Secondary | ICD-10-CM | POA: Diagnosis present

## 2021-04-19 DIAGNOSIS — Z79890 Hormone replacement therapy (postmenopausal): Secondary | ICD-10-CM

## 2021-04-19 DIAGNOSIS — S8992XA Unspecified injury of left lower leg, initial encounter: Secondary | ICD-10-CM | POA: Diagnosis not present

## 2021-04-19 DIAGNOSIS — Z90710 Acquired absence of both cervix and uterus: Secondary | ICD-10-CM

## 2021-04-19 DIAGNOSIS — M48062 Spinal stenosis, lumbar region with neurogenic claudication: Secondary | ICD-10-CM | POA: Diagnosis present

## 2021-04-19 DIAGNOSIS — Z20822 Contact with and (suspected) exposure to covid-19: Secondary | ICD-10-CM | POA: Diagnosis not present

## 2021-04-19 DIAGNOSIS — M25561 Pain in right knee: Secondary | ICD-10-CM | POA: Diagnosis not present

## 2021-04-19 DIAGNOSIS — I6320 Cerebral infarction due to unspecified occlusion or stenosis of unspecified precerebral arteries: Secondary | ICD-10-CM | POA: Diagnosis not present

## 2021-04-19 DIAGNOSIS — F419 Anxiety disorder, unspecified: Secondary | ICD-10-CM | POA: Diagnosis present

## 2021-04-19 DIAGNOSIS — Z79899 Other long term (current) drug therapy: Secondary | ICD-10-CM

## 2021-04-19 DIAGNOSIS — Z8701 Personal history of pneumonia (recurrent): Secondary | ICD-10-CM

## 2021-04-19 DIAGNOSIS — F32A Depression, unspecified: Secondary | ICD-10-CM | POA: Diagnosis present

## 2021-04-19 DIAGNOSIS — R410 Disorientation, unspecified: Secondary | ICD-10-CM | POA: Diagnosis present

## 2021-04-19 DIAGNOSIS — K219 Gastro-esophageal reflux disease without esophagitis: Secondary | ICD-10-CM | POA: Diagnosis present

## 2021-04-19 LAB — RESP PANEL BY RT-PCR (FLU A&B, COVID) ARPGX2
Influenza A by PCR: NEGATIVE
Influenza B by PCR: NEGATIVE
SARS Coronavirus 2 by RT PCR: NEGATIVE

## 2021-04-19 LAB — RAPID URINE DRUG SCREEN, HOSP PERFORMED
Amphetamines: NOT DETECTED
Barbiturates: NOT DETECTED
Benzodiazepines: POSITIVE — AB
Cocaine: NOT DETECTED
Opiates: POSITIVE — AB
Tetrahydrocannabinol: NOT DETECTED

## 2021-04-19 LAB — URINALYSIS, ROUTINE W REFLEX MICROSCOPIC
Bilirubin Urine: NEGATIVE
Glucose, UA: NEGATIVE mg/dL
Hgb urine dipstick: NEGATIVE
Ketones, ur: NEGATIVE mg/dL
Leukocytes,Ua: NEGATIVE
Nitrite: POSITIVE — AB
Protein, ur: NEGATIVE mg/dL
Specific Gravity, Urine: 1.016 (ref 1.005–1.030)
pH: 7.5 (ref 5.0–8.0)

## 2021-04-19 LAB — BASIC METABOLIC PANEL
Anion gap: 11 (ref 5–15)
BUN: 14 mg/dL (ref 8–23)
CO2: 29 mmol/L (ref 22–32)
Calcium: 10.6 mg/dL — ABNORMAL HIGH (ref 8.9–10.3)
Chloride: 95 mmol/L — ABNORMAL LOW (ref 98–111)
Creatinine, Ser: 0.79 mg/dL (ref 0.44–1.00)
GFR, Estimated: >60 mL/min (ref >60)
Glucose, Bld: 105 mg/dL — ABNORMAL HIGH (ref 70–99)
Potassium: 3.4 mmol/L — ABNORMAL LOW (ref 3.5–5.1)
Sodium: 135 mmol/L (ref 135–145)

## 2021-04-19 LAB — AMMONIA: Ammonia: 15 umol/L (ref 9–35)

## 2021-04-19 MED ORDER — GADOBUTROL 1 MMOL/ML IV SOLN
6.5000 mL | Freq: Once | INTRAVENOUS | Status: AC | PRN
  Administered 2021-04-19: 6.5 mL via INTRAVENOUS

## 2021-04-19 NOTE — ED Notes (Signed)
Patient transported to CT 

## 2021-04-19 NOTE — ED Notes (Signed)
Handoff report given to [facility] and to Verlin Fester

## 2021-04-19 NOTE — ED Notes (Signed)
ED TO INPATIENT HANDOFF REPORT  ED Nurse Name and Phone #:   S Name/Age/Gender [name] 87 y.o. female Room/Bed: WA12/WA12  Code Status   Code Status: Prior  Home/SNF/Other Home Patient oriented to: self, place, time, and situation Is this baseline? Yes   Triage Complete: Triage complete  Chief Complaint Leg weakness [R29.898]  Triage Note Patient BIB [facility] from [facility] Left Knee Pain.  Patient states she was at a [employer] on Saturday and Patient states her Left Knee has become stiff. Patient is able to bear weight.   Patient did also Fall today this AM and sustained a Head Injury to Right Head. No History of Blooding Thinning Medication. No LOC.  NAD Noted during Triage. BIB Wheelchair. A&Ox4. GCS 15. No Neurological Symptoms noted during Triage. History of L3 and L4 Fusion.    Allergies No Known Allergies  Level of Care/Admitting Diagnosis ED Disposition     ED Disposition  Admit   Condition  --   Comment  Hospital Area: [place] [[id number]]  Level of Care: Med-Surg [16]  May place patient in observation at [facility] or [place] [facility] if equivalent level of care is available:: Yes  Covid Evaluation: Confirmed COVID Negative  Diagnosis: Leg weakness [342752]  Admitting Physician: Eben Burow [[id number]]  Attending Physician: Eben Burow [[id number]]          B Medical/Surgery History Past Medical History:  Diagnosis Date   Anxiety    Arthritis    Chronic headaches    Colon polyps    Complication of anesthesia    Elevated blood pressure after having last Kyphoplasty; pt stated "I was given Morphine and had to stay overnight"   Coronary artery disease    DI (detrusor instability)    Fracture of vertebra    x 3   GERD (gastroesophageal reflux disease)    History of hiatal hernia    HLD (hyperlipidemia)    Hypertension    Hypothyroidism    Menopausal symptoms    MGUS (monoclonal gammopathy of unknown  significance) 12/27/2017   IgA   Osteoporosis    Peripheral neuropathy 12/27/2017   Peripheral neuropathy    Pneumonia    Restless leg syndrome    Varicose veins    Past Surgical History:  Procedure Laterality Date   ABDOMINAL HYSTERECTOMY  1992   TAH,BSO   BLADDER SUSPENSION  2011   CRYOMESH   CARPAL TUNNEL RELEASE Bilateral 1982   CHOLECYSTECTOMY  1992   COLONOSCOPY     EYE SURGERY Bilateral    Cataract removal   KYPHOPLASTY     X 3   LUMBAR LAMINECTOMY WITH COFLEX 1 LEVEL N/A 12/29/2015   Procedure: L3-4 Laminectomy with Coflex;  Surgeon: Kristeen Miss, MD;  Location: [facility];  Service: Neurosurgery;  Laterality: N/A;  L3-4 Laminectomy with coflex   OOPHORECTOMY     BSO   REPLACEMENT TOTAL KNEE Bilateral 2008,  2011   RIGHT 2008, LEFT 2011   REVERSE SHOULDER ARTHROPLASTY Right 01/30/2020   Procedure: REVERSE SHOULDER ARTHROPLASTY;  Surgeon: Nicholes Stairs, MD;  Location: [facility];  Service: Orthopedics;  Laterality: Right;  2.5 hrs   SPINAL FUSION  2011   VERTEBRAL SURGERY     T-8, T-10. T-12  DR Roselee Culver     A IV Location/Drains/Wounds Patient Lines/Drains/Airways Status     Active Line/Drains/Airways     Name Placement date Placement time Site Days   Peripheral IV 04/19/21 20 G  1" Right Antecubital 04/19/21  1522  Antecubital  less than 1   Incision (Closed) 12/29/15 Back Other (Comment) 12/29/15  1541  -- 1938   Incision (Closed) 01/30/20 Shoulder Right 01/30/20  1204  -- 445   Wound / Incision (Open or Dehisced) 11/07/14 Laceration;Puncture Leg Left;Upper;Medial  11/07/14  1530  Leg  2355            Intake/Output Last 24 hours No intake or output data in the 24 hours ending 04/19/21 2339  Labs/Imaging Results for orders placed or performed during the hospital encounter of 04/19/21 (from the past 48 hour(s))  Basic metabolic panel     Status: Abnormal   Collection Time: 04/19/21  2:50 PM  Result Value Ref Range   Sodium 135 135 - 145 mmol/L    Potassium 3.4 (L) 3.5 - 5.1 mmol/L   Chloride 95 (L) 98 - 111 mmol/L   CO2 29 22 - 32 mmol/L   Glucose, Bld 105 (H) 70 - 99 mg/dL    Comment: Glucose reference range applies only to samples taken after fasting for at least 8 hours.   BUN 14 8 - 23 mg/dL   Creatinine, Ser 0.79 0.44 - 1.00 mg/dL   Calcium 10.6 (H) 8.9 - 10.3 mg/dL   GFR, Estimated >60 >60 mL/min    Comment: (NOTE) Calculated using the CKD-EPI Creatinine Equation (2021)    Anion gap 11 5 - 15    Comment: Performed at [facility], [address] [place], [place] [address]  Resp Panel by RT-PCR (Flu A&B, Covid) Nasopharyngeal Swab     Status: None   Collection Time: 04/19/21  2:59 PM   Specimen: Nasopharyngeal Swab; Nasopharyngeal(NP) swabs in vial transport medium  Result Value Ref Range   SARS Coronavirus 2 by RT PCR NEGATIVE NEGATIVE    Comment: (NOTE) SARS-CoV-2 target nucleic acids are NOT DETECTED.  The SARS-CoV-2 RNA is generally detectable in upper respiratory specimens during the acute phase of infection. The lowest concentration of SARS-CoV-2 viral copies this assay can detect is 138 copies/mL. A negative result does not preclude SARS-Cov-2 infection and should not be used as the sole basis for treatment or other patient management decisions. A negative result may occur with  improper specimen collection/handling, submission of specimen other than nasopharyngeal swab, presence of viral mutation(s) within the areas targeted by this assay, and inadequate number of viral copies(<138 copies/mL). A negative result must be combined with clinical observations, patient history, and epidemiological information. The expected result is Negative.  Fact Sheet for Patients:  EntrepreneurPulse.com.au  Fact Sheet for Healthcare Providers:  IncredibleEmployment.be  This test is no t yet approved or cleared by the [place] FDA and  has been authorized for  detection and/or diagnosis of SARS-CoV-2 by FDA under an Emergency Use Authorization (EUA). This EUA will remain  in effect (meaning this test can be used) for the duration of the COVID-19 declaration under Section 564(b)(1) of the Act, 21 U.S.C.section 360bbb-3(b)(1), unless the authorization is terminated  or revoked sooner.       Influenza A by PCR NEGATIVE NEGATIVE   Influenza B by PCR NEGATIVE NEGATIVE    Comment: (NOTE) The Xpert Xpress SARS-CoV-2/FLU/RSV plus assay is intended as an aid in the diagnosis of influenza from Nasopharyngeal swab specimens and should not be used as a sole basis for treatment. Nasal washings and aspirates are unacceptable for Xpert Xpress SARS-CoV-2/FLU/RSV testing.  Fact Sheet for Patients: EntrepreneurPulse.com.au  Fact Sheet for Healthcare Providers:  IncredibleEmployment.be  This test is not yet approved or cleared by the [place] and has been authorized for detection and/or diagnosis of SARS-CoV-2 by FDA under an Emergency Use Authorization (EUA). This EUA will remain in effect (meaning this test can be used) for the duration of the COVID-19 declaration under Section 564(b)(1) of the Act, 21 U.S.C. section 360bbb-3(b)(1), unless the authorization is terminated or revoked.  Performed at [facility], [address], [place], [place] [address]   Ammonia     Status: None   Collection Time: 04/19/21  3:15 PM  Result Value Ref Range   Ammonia 15 9 - 35 umol/L    Comment: Performed at [facility], [address] [place], [place] [address]  Urinalysis, Routine w reflex microscopic Urine, Clean Catch     Status: Abnormal   Collection Time: 04/19/21  4:28 PM  Result Value Ref Range   Color, Urine YELLOW YELLOW   APPearance CLEAR CLEAR   Specific Gravity, Urine 1.016 1.005 - 1.030   pH 7.5 5.0 - 8.0   Glucose, UA NEGATIVE NEGATIVE mg/dL   Hgb urine dipstick  NEGATIVE NEGATIVE   Bilirubin Urine NEGATIVE NEGATIVE   Ketones, ur NEGATIVE NEGATIVE mg/dL   Protein, ur NEGATIVE NEGATIVE mg/dL   Nitrite POSITIVE (A) NEGATIVE   Leukocytes,Ua NEGATIVE NEGATIVE   RBC / HPF 0-5 0 - 5 RBC/hpf   WBC, UA 0-5 0 - 5 WBC/hpf   Bacteria, UA RARE (A) NONE SEEN   Mucus PRESENT    Hyaline Casts, UA PRESENT     Comment: Performed at [facility], [place], [place] [address]  Rapid urine drug screen (hospital performed)     Status: Abnormal   Collection Time: 04/19/21  4:28 PM  Result Value Ref Range   Opiates POSITIVE (A) NONE DETECTED   Cocaine NONE DETECTED NONE DETECTED   Benzodiazepines POSITIVE (A) NONE DETECTED   Amphetamines NONE DETECTED NONE DETECTED   Tetrahydrocannabinol NONE DETECTED NONE DETECTED   Barbiturates NONE DETECTED NONE DETECTED    Comment: (NOTE) DRUG SCREEN FOR MEDICAL PURPOSES ONLY.  IF CONFIRMATION IS NEEDED FOR ANY PURPOSE, NOTIFY LAB WITHIN 5 DAYS.  LOWEST DETECTABLE LIMITS FOR URINE DRUG SCREEN Drug Class                     Cutoff (ng/mL) Amphetamine and metabolites    1000 Barbiturate and metabolites    200 Benzodiazepine                 469 Tricyclics and metabolites     300 Opiates and metabolites        300 Cocaine and metabolites        300 THC                            50 Performed at [facility], [address] [place], [place] [address]    CT HEAD WO CONTRAST  Result Date: 04/19/2021 CLINICAL DATA:  Head trauma reported in a 87 year old female. EXAM: CT HEAD WITHOUT CONTRAST TECHNIQUE: Contiguous axial images were obtained from the base of the skull through the vertex without intravenous contrast. COMPARISON:  CT of the head from June of 2020. Also MRI of the brain from June 15, 2019 FINDINGS: Brain: No evidence of acute infarction, hemorrhage, hydrocephalus, extra-axial collection or mass lesion/mass effect. Vascular: No hyperdense vessel or  unexpected calcification. Skull: Normal. Negative  for fracture or focal lesion. Sinuses/Orbits: Visualized paranasal sinuses and orbits are unremarkable. Signs of prior LEFT orbital fracture are redemonstrated. Other: Potential small scalp contusion in the soft tissues overlying the RIGHT parietal region near the vertex. IMPRESSION: No acute intracranial abnormality. Evidence of previous LEFT orbital floor fracture without change. Potential scalp hematoma or small contusion in the high RIGHT parietal scalp. Electronically Signed   By: Zetta Bills M.D.   On: 04/19/2021 15:24   CT CERVICAL SPINE WO CONTRAST  Result Date: 04/19/2021 CLINICAL DATA:  Neck trauma. Fall earlier today with head injury. Previous cervical fusion. EXAM: CT CERVICAL SPINE WITHOUT CONTRAST TECHNIQUE: Multidetector CT imaging of the cervical spine was performed without intravenous contrast. Multiplanar CT image reconstructions were also generated. COMPARISON:  Cervical MRI 01/14/2019. Radiographs 11/02/2016. CT 08/15/2009. FINDINGS: Alignment: Near anatomic. There is a slight anterolisthesis at C4-5 which appears stable. Skull base and vertebrae: No evidence of acute fracture or traumatic subluxation. Previous anterior discectomy and disc replacement at C5-6 and C6-7. Soft tissues and spinal canal: No prevertebral fluid or swelling. No visible canal hematoma. There is synovial calcification around the odontoid process without osseous erosion. Disc levels: Stable mild multilevel spondylosis with disc bulging and uncinate spurring. No large disc herniation or high-grade spinal stenosis. Upper chest: Mild biapical scarring. Other: Bilateral carotid atherosclerosis. IMPRESSION: 1. No evidence of acute cervical spine fracture or traumatic subluxation. 2. Stable degenerative and postsurgical changes as described. No high-grade spinal stenosis identified. Electronically Signed   By: Richardean Sale M.D.   On: 04/19/2021 15:33   CT Lumbar Spine  Wo Contrast  Result Date: 04/19/2021 CLINICAL DATA:  Low back pain for more than 6 weeks. Recent fall with leg weakness and recent spinal injection. EXAM: CT LUMBAR SPINE WITHOUT CONTRAST TECHNIQUE: Multidetector CT imaging of the lumbar spine was performed without intravenous contrast administration. Multiplanar CT image reconstructions were also generated. COMPARISON:  Abdominopelvic CT 04/01/2021.  Lumbar MRI 03/10/2021. FINDINGS: Segmentation: There are 5 lumbar type vertebral bodies. Alignment: Stable degenerative anterolisthesis at L3-4. Otherwise normal. Vertebrae: No evidence of acute lumbar spine fracture or traumatic subluxation. Stable T11 fracture status post spinal augmentation. Patient is status post L4-5 PLIF with intact hardware. Inter spinous fusion has been performed at L3-4. Paraspinal and other soft tissues: No significant paraspinal findings. Aortic and branch vessel atherosclerosis noted. Disc levels: T12-L1: The disc appears normal. Mild bilateral facet hypertrophy. No spinal stenosis. L1-2: Loss of disc height with vacuum phenomenon, annular disc bulging and bilateral facet hypertrophy. Stable borderline spinal stenosis with mild right lateral recess and right foraminal narrowing. L2-3: Loss of disc height with annular disc bulging, endplate osteophytes and bilateral facet hypertrophy. Stable mild spinal stenosis with mild lateral recess and foraminal narrowing on the left. L3-4: Evaluation mildly limited by beam hardening artifact from the previous spinal instrumentation. Chronic adjacent segment disease with loss of disc height, annular disc bulging and endplate osteophytes. Previous posterior decompression and inter spinous fusion. There is stable mild spinal stenosis with asymmetric left lateral recess and left foraminal narrowing. L4-5: Stable appearance status post laminectomy and PLIF. No spinal stenosis or nerve root encroachment. L5-S1: Advanced asymmetric facet hypertrophy on the  right. Disc height is maintained. No spinal stenosis or nerve root encroachment. IMPRESSION: 1. No evidence of acute lumbar spine fracture or traumatic subluxation. 2. Previous T11 spinal augmentation. 3. Stable postsurgical changes at L3-4 and L4-5. Stable residual spinal stenosis as detailed above. Electronically Signed   By: Caryl Comes.D.  On: 04/19/2021 15:25   MR Lumbar Spine W Wo Contrast  Result Date: 04/19/2021 CLINICAL DATA:  Lumbar radiculopathy, prior surgery EXAM: MRI LUMBAR SPINE WITHOUT AND WITH CONTRAST TECHNIQUE: Multiplanar and multiecho pulse sequences of the lumbar spine were obtained without and with intravenous contrast. CONTRAST:  6.64mL GADAVIST GADOBUTROL 1 MMOL/ML IV SOLN COMPARISON:  03/10/2021 lumbar spine MRI, correlation is made with same day CT lumbar spine. FINDINGS: Segmentation:  Standard. Alignment: Dextrocurvature of the lumbar spine. Unchanged mild retrolisthesis L1 on L2. Unchanged grade 1 anterolisthesis L3 on L4. Vertebrae: No acute fracture or suspicious osseous lesion. Status post L4-L5 posterior fusion and decompression. No abnormal enhancement. Conus medullaris and cauda equina: Conus extends to the L1-L2 level. Conus and cauda equina appear normal. No abnormal enhancement. Paraspinal and other soft tissues: Postoperative changes in the soft tissues posterior to the lower lumbar spine. Otherwise negative. Disc levels: T12-L1: No significant disc bulge. No spinal canal stenosis or neural foraminal narrowing. L1-L2: Mild retrolisthesis with broad-based disc bulge. Moderate facet arthropathy. Ligamentum flavum hypertrophy. Mild narrowing of the lateral recesses. Minimal spinal canal stenosis, unchanged. Mild right neural foraminal narrowing, unchanged. L2-L3: Disc height loss with left eccentric disc bulge, with left extreme lateral disc protrusion. Moderate facet arthropathy and ligamentum flavum hypertrophy. Mild narrowing of the left lateral recess, which could  affect the descending left L3 nerve. No spinal canal stenosis. No neural foraminal narrowing. L3-L4: Status post fusion of the spinous processes. Unchanged grade 1 anterolisthesis with disc unroofing and moderate disc bulge. Moderate to severe facet arthropathy, left greater than right. Ligamentum flavum hypertrophy. Mild spinal canal stenosis. Effacement of the left lateral recess. Mild bilateral neural foraminal narrowing, unchanged. L4-L5: Status post decompression and fusion. No spinal canal stenosis or neural foraminal narrowing. L5-S1: Minimal disc bulge. Severe right and mild left facet arthropathy. No spinal canal stenosis or neural foraminal narrowing. IMPRESSION: 1. L3-L4 mild spinal canal stenosis, with effacement of the left lateral recess that likely compresses the descending left L4 nerve. In addition there is mild bilateral neural foraminal narrowing. Overall this level appears unchanged compared to 03/10/2021. 2. Other levels are also unchanged compared to 03/10/2021. No abnormal enhancement. Electronically Signed   By: Merilyn Baba M.D.   On: 04/19/2021 20:59   DG Knee Complete 4 Views Left  Result Date: 04/19/2021 CLINICAL DATA:  Trauma, fall, pain EXAM: LEFT KNEE - COMPLETE 4+ VIEW COMPARISON:  None. FINDINGS: No recent fracture or dislocation is seen. There is previous left knee arthroplasty. There is no significant effusion in the suprapatellar bursa. IMPRESSION: Previous left knee arthroplasty. No recent fracture or dislocation is seen. Electronically Signed   By: Elmer Picker M.D.   On: 04/19/2021 13:30    Pending Labs Unresulted Labs (From admission, onward)     Start     Ordered   04/19/21 1515  CBC with Differential/Platelet  Once,   STAT        04/19/21 1515   04/19/21 1429  CBC WITH DIFFERENTIAL  ONCE - STAT,   STAT        04/19/21 1430   Signed and Held  Basic metabolic panel  Tomorrow morning,   R        Signed and Held   Signed and Held  CBC  Tomorrow morning,    R        Signed and Held            Vitals/Pain Today's Vitals   04/19/21 2030 04/19/21 2100 04/19/21  2200 04/19/21 2330  BP: (!) 119/99 130/80 (!) 147/98 (!) 155/91  Pulse: 96 96 87 88  Resp: 18 (!) 25 19 17   Temp:      TempSrc:      SpO2: 95% 98% 96% 97%  Weight:      Height:      PainSc:        Isolation Precautions No active isolations  Medications Medications  gadobutrol (GADAVIST) 1 MMOL/ML injection 6.5 mL (6.5 mLs Intravenous Contrast Given 04/19/21 2000)    Mobility walks with device Low fall risk   Focused Assessments     R Recommendations: See Admitting Provider Note  Report given to:   Additional Notes:

## 2021-04-19 NOTE — H&P (Signed)
History and Physical    BRION HEDGES QZE:[id number] DOB: September 05, 1933 DOA: 04/19/2021  PCP: Burnard Bunting, MD   Patient coming from: Home  Chief Complaint: Leg weakness  HPI: [name] is a 87 y.o. female with medical history significant for HTN, HLD, hypothyroidism, peripheral neuropathy, hx of vertebral fractures who presents for evaluation of leg weakness.  She reports that she went to see her grandsons baseball game Saturday evening and on the way back to her car she felt her left leg was becoming very weak and her son had to help her to the car.  After she arrived home she had worsening left leg weakness and had to drag her leg when she ambulated.  She is always been independent and lives by herself and has not had difficulty with ambulation until this occurred Saturday night.  She does report having some lower back pain that is chronic and she is followed by neurosurgery.  She does have history of spinal stenosis in her lumbar spine.  She denies having any injury or trauma to her back.  She denies any numbness of her pelvic or thigh region.  She denies any difficulty with urination or bowel movements.  She has not had any fevers or chills.  She did fall yesterday hitting her head but had no loss of consciousness.  She is able to provide history and answer questions appropriately.  She does state that 2 days ago her son and grandson those she seemed little confused but she feels like she is back to her baseline now.  She had an MRI with contrast of her lumbar spine show any acute changes.  This was discussed with neurosurgery who did not feel there was any need for neurosurgical intervention at this time.  MRI of the brain was ordered to make sure there is no stroke as the etiology of her symptoms but MRI of the brain cannot be obtained until the morning secondary to receiving IV contrast this afternoon Denies tobacco alcohol or illicit drug use  ED Course: Ms. Polack is been  hemodynamically stable while in the emergency room and has no complaints at this time other than weakness in her left leg.  She was ambulated in the emergency room and requires 2 person assist to ambulate which is not normal for her.  Lab work reveals sodium 135 potassium 3.4 chloride 95 bicarb 29 creatinine 0.79 BUN 14 glucose 105.  Hospitalist service asked to evaluate and manage patient overnight  Review of Systems:  General: Denies fever, chills, weight loss, night sweats.  Denies dizziness.  Denies change in appetite HENT: Denies headache, denies change in hearing, tinnitus.  Denies nasal bleeding.  Denies sore throat, sores in mouth.  Denies difficulty swallowing Eyes: Denies blurry vision, pain in eye, drainage.  Denies discoloration of eyes. Neck: Denies pain.  Denies swelling.  Denies pain with movement. Cardiovascular: Denies chest pain, palpitations.  Denies edema.  Denies orthopnea Respiratory: Denies shortness of breath, cough.  Denies wheezing.  Denies sputum production Gastrointestinal: Denies abdominal pain, swelling.  Denies nausea, vomiting, diarrhea.  Denies melena.  Denies hematemesis. Musculoskeletal: Reports weakness in left leg. Denies limitation of movement.  Denies deformity or swelling.  Denies arthralgias or myalgias. Genitourinary: Denies pelvic pain.  Denies urinary frequency or hesitancy.  Denies dysuria.  Skin: Denies rash.  Denies petechiae, purpura, ecchymosis. Neurological: Denies syncope.  Denies seizure activity. Denies slurred speech, drooping face.  Denies visual change. Psychiatric: Denies depression, anxiety.  Denies hallucinations.  Past Medical History:  Diagnosis Date   Anxiety    Arthritis    Chronic headaches    Colon polyps    Complication of anesthesia    Elevated blood pressure after having last Kyphoplasty; pt stated "I was given Morphine and had to stay overnight"   Coronary artery disease    DI (detrusor instability)    Fracture of vertebra     x 3   GERD (gastroesophageal reflux disease)    History of hiatal hernia    HLD (hyperlipidemia)    Hypertension    Hypothyroidism    Menopausal symptoms    MGUS (monoclonal gammopathy of unknown significance) 12/27/2017   IgA   Osteoporosis    Peripheral neuropathy 12/27/2017   Peripheral neuropathy    Pneumonia    Restless leg syndrome    Varicose veins     Past Surgical History:  Procedure Laterality Date   ABDOMINAL HYSTERECTOMY  1992   TAH,BSO   BLADDER SUSPENSION  2011   CRYOMESH   CARPAL TUNNEL RELEASE Bilateral 1982   CHOLECYSTECTOMY  1992   COLONOSCOPY     EYE SURGERY Bilateral    Cataract removal   KYPHOPLASTY     X 3   LUMBAR LAMINECTOMY WITH COFLEX 1 LEVEL N/A 12/29/2015   Procedure: L3-4 Laminectomy with Coflex;  Surgeon: Kristeen Miss, MD;  Location: [facility];  Service: Neurosurgery;  Laterality: N/A;  L3-4 Laminectomy with coflex   OOPHORECTOMY     BSO   REPLACEMENT TOTAL KNEE Bilateral 2008,  2011   RIGHT 2008, LEFT 2011   REVERSE SHOULDER ARTHROPLASTY Right 01/30/2020   Procedure: REVERSE SHOULDER ARTHROPLASTY;  Surgeon: Nicholes Stairs, MD;  Location: [facility];  Service: Orthopedics;  Laterality: Right;  2.5 hrs   SPINAL FUSION  2011   VERTEBRAL SURGERY     T-8, T-10. T-12  DR Roselee Culver    Social History  reports that she has never smoked. She has never used smokeless tobacco. She reports that she does not drink alcohol and does not use drugs.  No Known Allergies  Family History  Problem Relation Age of Onset   Heart disease Mother    Heart disease Father    Stroke Father    Hypertension Sister    Diabetes Sister    Ovarian cancer Sister    Diabetes Sister    Rectal cancer Sister        ? Colon or rectal   Hyperlipidemia Sister    Hypertension Sister    Hyperlipidemia Sister    Hypertension Sister    Hypertension Brother    Heart disease Brother    Heart disease Brother    Hypertension Brother    Heart disease Brother     Heart disease Brother    Heart disease Brother    Heart disease Brother    Breast cancer Niece    Heart disease Son    Hyperlipidemia Son    Hyperlipidemia Daughter    Hypertension Daughter      Prior to Admission medications   Medication Sig Start Date End Date Taking? Authorizing Provider  ALPRAZolam Duanne Moron) 0.5 MG tablet Take 0.25 mg by mouth at bedtime as needed (restless legs).   Yes [provider]  amLODipine (NORVASC) 5 MG tablet Take 1 tablet (5 mg total) by mouth daily. Patient taking differently: Take 2.5 mg by mouth in the morning and at bedtime. 12/07/20  Yes Belva Crome, MD  amoxicillin (AMOXIL) 500 MG capsule Take  2,000 mg by mouth See admin instructions. Take 4 capsules (2000 mg) by mouth one hour prior to dental appointments 03/29/21  Yes [provider]  Ascorbic Acid (VITAMIN C PO) Take 1 tablet by mouth daily.   Yes [provider]  aspirin EC 81 MG tablet Take 81 mg by mouth every morning. Swallow whole.   Yes [provider]  Calcium Carbonate-Vitamin D (CALCIUM-D PO) Take 1 tablet by mouth daily.   Yes [provider]  celecoxib (CELEBREX) 200 MG capsule Take 200 mg by mouth daily as needed for moderate pain. 12/19/19  Yes [provider]  dexlansoprazole (DEXILANT) 60 MG capsule Take 60 mg by mouth every morning.   Yes [provider]  escitalopram (LEXAPRO) 10 MG tablet Take 10 mg by mouth at bedtime.   Yes [provider]  levothyroxine (SYNTHROID, LEVOTHROID) 25 MCG tablet Take 25 mcg by mouth daily before breakfast.    Yes [provider]  metoprolol succinate (TOPROL-XL) 25 MG 24 hr tablet Take 25 mg by mouth every morning.   Yes [provider]  Multiple Vitamin (MULTIVITAMIN WITH MINERALS) TABS tablet Take 1 tablet by mouth daily.   Yes [provider]  NONFORMULARY OR COMPOUNDED ITEM Apply 1 application topically See admin instructions. Transdermal therapeutics   meloxicam 0.5%, doxepin 3%, amantadine 3%, dextromethorphan 2%, lidocaine 2%. - compounded at [facility]:  Apply topically to legs daily after showering for neuropathy   Yes [provider]  Polyvinyl Alcohol-Povidone (REFRESH OP) Place 1 drop into both eyes daily as needed (dry eyes).   Yes [provider]  rosuvastatin (CRESTOR) 20 MG tablet Take 20 mg by mouth 2 (two) times a week.   Yes [provider]  triamterene-hydrochlorothiazide (MAXZIDE-25) 37.5-25 MG per tablet Take 1 tablet by mouth every morning.   Yes [provider]  Vitamin D, Ergocalciferol, (DRISDOL) 50000 UNITS CAPS Take 50,000 Units by mouth every Friday.   Yes [provider]  VITAMIN E PO Take 1 capsule by mouth daily.   Yes [provider]  zolpidem (AMBIEN CR) 12.5 MG CR tablet Take 12.5 mg by mouth at bedtime.   Yes [provider]  Wheat Dextrin (BENEFIBER) POWD Take 1 daily dose Patient not taking: Reported on 04/19/2021 02/09/21   Thornton Park, MD    Physical Exam: Vitals:   04/19/21 1915 04/19/21 2030 04/19/21 2100 04/19/21 2200  BP: (!) 155/82 (!) 119/99 130/80 (!) 147/98  Pulse: 95 96 96 87  Resp: 18 18 (!) 25 19  Temp: 98.2 F (36.8 C)     TempSrc: Oral     SpO2: 95% 95% 98% 96%  Weight:      Height:        Constitutional: NAD, calm, comfortable Vitals:   04/19/21 1915 04/19/21 2030 04/19/21 2100 04/19/21 2200  BP: (!) 155/82 (!) 119/99 130/80 (!) 147/98  Pulse: 95 96 96 87  Resp: 18 18 (!) 25 19  Temp: 98.2 F (36.8 C)     TempSrc: Oral     SpO2: 95% 95% 98% 96%  Weight:      Height:       General: WDWN, Alert and oriented x3.  Eyes: EOMI, PERRL, conjunctivae normal.  Sclera nonicteric HENT:  [place], Has hematoma on right posterior scalp. external ears normal.  Nares patent without epistasis.  Mucous membranes are moist.  Neck: Soft, normal range of motion, supple, no masses, no thyromegaly.  Trachea  midline Respiratory: clear  to auscultation bilaterally, no wheezing, no crackles. Normal respiratory effort. No accessory muscle use.  Cardiovascular: Regular rate and rhythm, no murmurs / rubs / gallops. No extremity edema. 2+ pedal pulses.   Abdomen: Soft, no tenderness, nondistended, no rebound or guarding.  No masses palpated. Bowel sounds normoactive Musculoskeletal: FROM. no cyanosis. No joint deformity upper and lower extremities. Normal muscle tone.  Skin: Warm, dry, intact no rashes, lesions, ulcers. No induration Neurologic: CN 2-12 grossly intact.  Normal speech.  Sensation intact to touch, patella DTR +1 bilaterally. Strength 4/5 in all extremities.   Psychiatric: Normal judgment and insight. Normal mood.    Labs on Admission: I have personally reviewed following labs and imaging studies  CBC: No results for input(s): WBC, NEUTROABS, HGB, HCT, MCV, PLT in the last 168 hours.  Basic Metabolic Panel: Recent Labs  Lab 04/19/21 1450  NA 135  K 3.4*  CL 95*  CO2 29  GLUCOSE 105*  BUN 14  CREATININE 0.79  CALCIUM 10.6*    GFR: Estimated Creatinine Clearance: 42.4 mL/min (by C-G formula based on SCr of 0.79 mg/dL).  Liver Function Tests: No results for input(s): AST, ALT, ALKPHOS, BILITOT, PROT, ALBUMIN in the last 168 hours.  Urine analysis:    Component Value Date/Time   COLORURINE YELLOW 04/19/2021 1628   APPEARANCEUR CLEAR 04/19/2021 1628   LABSPEC 1.016 04/19/2021 1628   PHURINE 7.5 04/19/2021 1628   GLUCOSEU NEGATIVE 04/19/2021 1628   HGBUR NEGATIVE 04/19/2021 1628   BILIRUBINUR NEGATIVE 04/19/2021 1628   KETONESUR NEGATIVE 04/19/2021 1628   PROTEINUR NEGATIVE 04/19/2021 1628   UROBILINOGEN 0.2 08/18/2011 1407   NITRITE POSITIVE (A) 04/19/2021 1628   LEUKOCYTESUR NEGATIVE 04/19/2021 1628    Radiological Exams on Admission: CT HEAD WO CONTRAST  Result Date: 04/19/2021 CLINICAL DATA:  Head trauma reported in a 87 year old female. EXAM: CT HEAD WITHOUT  CONTRAST TECHNIQUE: Contiguous axial images were obtained from the base of the skull through the vertex without intravenous contrast. COMPARISON:  CT of the head from June of 2020. Also MRI of the brain from June 15, 2019 FINDINGS: Brain: No evidence of acute infarction, hemorrhage, hydrocephalus, extra-axial collection or mass lesion/mass effect. Vascular: No hyperdense vessel or unexpected calcification. Skull: Normal. Negative for fracture or focal lesion. Sinuses/Orbits: Visualized paranasal sinuses and orbits are unremarkable. Signs of prior LEFT orbital fracture are redemonstrated. Other: Potential small scalp contusion in the soft tissues overlying the RIGHT parietal region near the vertex. IMPRESSION: No acute intracranial abnormality. Evidence of previous LEFT orbital floor fracture without change. Potential scalp hematoma or small contusion in the high RIGHT parietal scalp. Electronically Signed   By: Zetta Bills M.D.   On: 04/19/2021 15:24   CT CERVICAL SPINE WO CONTRAST  Result Date: 04/19/2021 CLINICAL DATA:  Neck trauma. Fall earlier today with head injury. Previous cervical fusion. EXAM: CT CERVICAL SPINE WITHOUT CONTRAST TECHNIQUE: Multidetector CT imaging of the cervical spine was performed without intravenous contrast. Multiplanar CT image reconstructions were also generated. COMPARISON:  Cervical MRI 01/14/2019. Radiographs 11/02/2016. CT 08/15/2009. FINDINGS: Alignment: Near anatomic. There is a slight anterolisthesis at C4-5 which appears stable. Skull base and vertebrae: No evidence of acute fracture or traumatic subluxation. Previous anterior discectomy and disc replacement at C5-6 and C6-7. Soft tissues and spinal canal: No prevertebral fluid or swelling. No visible canal hematoma. There is synovial calcification around the odontoid process without osseous erosion. Disc levels: Stable mild multilevel spondylosis with disc bulging and uncinate spurring. No large disc herniation or  high-grade spinal stenosis. Upper chest: Mild biapical scarring. Other: Bilateral carotid atherosclerosis. IMPRESSION: 1. No evidence of acute cervical spine fracture or traumatic subluxation. 2. Stable degenerative and postsurgical changes as described. No high-grade spinal stenosis identified. Electronically Signed   By: Richardean Sale M.D.   On: 04/19/2021 15:33   CT Lumbar Spine Wo Contrast  Result Date: 04/19/2021 CLINICAL DATA:  Low back pain for more than 6 weeks. Recent fall with leg weakness and recent spinal injection. EXAM: CT LUMBAR SPINE WITHOUT CONTRAST TECHNIQUE: Multidetector CT imaging of the lumbar spine was performed without intravenous contrast administration. Multiplanar CT image reconstructions were also generated. COMPARISON:  Abdominopelvic CT 04/01/2021.  Lumbar MRI 03/10/2021. FINDINGS: Segmentation: There are 5 lumbar type vertebral bodies. Alignment: Stable degenerative anterolisthesis at L3-4. Otherwise normal. Vertebrae: No evidence of acute lumbar spine fracture or traumatic subluxation. Stable T11 fracture status post spinal augmentation. Patient is status post L4-5 PLIF with intact hardware. Inter spinous fusion has been performed at L3-4. Paraspinal and other soft tissues: No significant paraspinal findings. Aortic and branch vessel atherosclerosis noted. Disc levels: T12-L1: The disc appears normal. Mild bilateral facet hypertrophy. No spinal stenosis. L1-2: Loss of disc height with vacuum phenomenon, annular disc bulging and bilateral facet hypertrophy. Stable borderline spinal stenosis with mild right lateral recess and right foraminal narrowing. L2-3: Loss of disc height with annular disc bulging, endplate osteophytes and bilateral facet hypertrophy. Stable mild spinal stenosis with mild lateral recess and foraminal narrowing on the left. L3-4: Evaluation mildly limited by beam hardening artifact from the previous spinal instrumentation. Chronic adjacent segment disease  with loss of disc height, annular disc bulging and endplate osteophytes. Previous posterior decompression and inter spinous fusion. There is stable mild spinal stenosis with asymmetric left lateral recess and left foraminal narrowing. L4-5: Stable appearance status post laminectomy and PLIF. No spinal stenosis or nerve root encroachment. L5-S1: Advanced asymmetric facet hypertrophy on the right. Disc height is maintained. No spinal stenosis or nerve root encroachment. IMPRESSION: 1. No evidence of acute lumbar spine fracture or traumatic subluxation. 2. Previous T11 spinal augmentation. 3. Stable postsurgical changes at L3-4 and L4-5. Stable residual spinal stenosis as detailed above. Electronically Signed   By: Richardean Sale M.D.   On: 04/19/2021 15:25   MR Lumbar Spine W Wo Contrast  Result Date: 04/19/2021 CLINICAL DATA:  Lumbar radiculopathy, prior surgery EXAM: MRI LUMBAR SPINE WITHOUT AND WITH CONTRAST TECHNIQUE: Multiplanar and multiecho pulse sequences of the lumbar spine were obtained without and with intravenous contrast. CONTRAST:  6.22mL GADAVIST GADOBUTROL 1 MMOL/ML IV SOLN COMPARISON:  03/10/2021 lumbar spine MRI, correlation is made with same day CT lumbar spine. FINDINGS: Segmentation:  Standard. Alignment: Dextrocurvature of the lumbar spine. Unchanged mild retrolisthesis L1 on L2. Unchanged grade 1 anterolisthesis L3 on L4. Vertebrae: No acute fracture or suspicious osseous lesion. Status post L4-L5 posterior fusion and decompression. No abnormal enhancement. Conus medullaris and cauda equina: Conus extends to the L1-L2 level. Conus and cauda equina appear normal. No abnormal enhancement. Paraspinal and other soft tissues: Postoperative changes in the soft tissues posterior to the lower lumbar spine. Otherwise negative. Disc levels: T12-L1: No significant disc bulge. No spinal canal stenosis or neural foraminal narrowing. L1-L2: Mild retrolisthesis with broad-based disc bulge. Moderate facet  arthropathy. Ligamentum flavum hypertrophy. Mild narrowing of the lateral recesses. Minimal spinal canal stenosis, unchanged. Mild right neural foraminal narrowing, unchanged. L2-L3: Disc height loss with left eccentric disc bulge, with left extreme lateral disc protrusion. Moderate facet arthropathy and  ligamentum flavum hypertrophy. Mild narrowing of the left lateral recess, which could affect the descending left L3 nerve. No spinal canal stenosis. No neural foraminal narrowing. L3-L4: Status post fusion of the spinous processes. Unchanged grade 1 anterolisthesis with disc unroofing and moderate disc bulge. Moderate to severe facet arthropathy, left greater than right. Ligamentum flavum hypertrophy. Mild spinal canal stenosis. Effacement of the left lateral recess. Mild bilateral neural foraminal narrowing, unchanged. L4-L5: Status post decompression and fusion. No spinal canal stenosis or neural foraminal narrowing. L5-S1: Minimal disc bulge. Severe right and mild left facet arthropathy. No spinal canal stenosis or neural foraminal narrowing. IMPRESSION: 1. L3-L4 mild spinal canal stenosis, with effacement of the left lateral recess that likely compresses the descending left L4 nerve. In addition there is mild bilateral neural foraminal narrowing. Overall this level appears unchanged compared to 03/10/2021. 2. Other levels are also unchanged compared to 03/10/2021. No abnormal enhancement. Electronically Signed   By: Merilyn Baba M.D.   On: 04/19/2021 20:59   DG Knee Complete 4 Views Left  Result Date: 04/19/2021 CLINICAL DATA:  Trauma, fall, pain EXAM: LEFT KNEE - COMPLETE 4+ VIEW COMPARISON:  None. FINDINGS: No recent fracture or dislocation is seen. There is previous left knee arthroplasty. There is no significant effusion in the suprapatellar bursa. IMPRESSION: Previous left knee arthroplasty. No recent fracture or dislocation is seen. Electronically Signed   By: Elmer Picker M.D.   On:  04/19/2021 13:30    Assessment/Plan Principal Problem:   Leg weakness Ms. Jian is placed on MedSurg floor for observation.  She has mild spinal stenosis in the lumbar spine on CT and MRI of her back.  Neurosurgery did not feel this is an acute process.  MRI of the brain is been ordered but cannot be done to evaluate for stroke as patient had contrast earlier today and it needs to washout overnight and MRI brain can be obtained in the morning.  She normally is able to ambulate independently and lives independently.  She requires 2 person assist to ambulate now with left leg weakness.  Consult PT in morning for evaluation  Active Problems:   Lumbar stenosis with neurogenic claudication Chronic. Pain control provided with dilaudid if needed overnight.     Confusion Patient reports she had mild confusion yesterday is improved today.  MRI of the brain will be obtained in the morning    Essential hypertension Continue Norvasc, metoprolol and Maxide.  Monitor blood pressure    Hypokalemia Mildly low potassium. Supplement with oral potassium of 20 mEq tonight. Recheck electrolytes in am    DVT prophylaxis: SCDs for DVT prophylaxis  Code Status:   Full Code  Family Communication:  Diagnosis and plan discussed with patient.  Patient verbalized understanding and agrees with plan.  Further recommendation follow as clinical indicated Disposition Plan:   Patient is from:  Home  Anticipated DC to:  To be determined  Anticipated DC date:  Anticipate less than 2 midnight stay but this could change depending on results of work-up  Admission status:  Observation   Yevonne Aline Deante Blough MD [facility]  How to contact the [facility] Attending or Consulting provider [place] or covering provider during after hours [place], for this patient?   Check the care team in [facility] and look for a) attending/consulting [facility] provider listed and b) the [facility] team listed Log into [email or web address] and use [place]  universal password to access. If you do not have the password, please contact the  hospital operator. Locate the [facility] provider you are looking for under [facility] and page to a number that you can be directly reached. If you still have difficulty reaching the provider, please page the [facility] (Director on Call) for the Hospitalists listed on amion for assistance.  04/19/2021, 11:27 PM

## 2021-04-19 NOTE — ED Notes (Signed)
Ambulated pt to BR x 2 assist for safety. Pt was able to pick up and move left leg unlike from previous report.

## 2021-04-19 NOTE — ED Notes (Signed)
Pt to MRI

## 2021-04-19 NOTE — ED Provider Notes (Signed)
[facility] EMERGENCY DEPT Provider Note   CSN: [id number] Arrival date & time: 04/19/21  1224     History Chief Complaint  Patient presents with   Fall   Knee Pain    [name] is a 87 y.o. female who presents emergency department with a chief complaint of leg weakness and fall.  Patient has a past medical history of bilateral knee arthroplasty, history of lumbar surgery with hardware loosening and recent spinal injection this past week by Dr. Ellene Route.  Patient states that on Saturday she was at a grandchild's baseball game, walking back to the car was a long walk and she felt like her left leg was going to give out on her.  Her son helped her back to the car but she was able to walk on her own.  By the time she got home she had increasing weakness in her leg.  She states that she was able to take a shower but on the way back to her bed she felt like her leg was dragging.  Sunday the patient states she was barely able to walk.  This morning around 3:00 in the morning she woke up Go to the bathroom was unable to lift her leg up out of the bed to stand.  She had to use her arms to move it and when she stood she fell directly to the ground hitting her head.  She also suffered some skin tears to the left arm.  Patient states she had to crawl to of another room and use a chair to lift herself up on her other leg.  Around 10:30 in the morning her grandson came to check on her and noted that she was unable to stand or walk and was very confused and having difficulty telling him what was happening.  He states that currently she is back to about 70% of her normal mental status.   Fall  Knee Pain     Past Medical History:  Diagnosis Date   Anxiety    Arthritis    Chronic headaches    Colon polyps    Complication of anesthesia    Elevated blood pressure after having last Kyphoplasty; pt stated "I was given Morphine and had to stay overnight"   Coronary artery disease    DI  (detrusor instability)    Fracture of vertebra    x 3   GERD (gastroesophageal reflux disease)    History of hiatal hernia    HLD (hyperlipidemia)    Hypertension    Hypothyroidism    Menopausal symptoms    MGUS (monoclonal gammopathy of unknown significance) 12/27/2017   IgA   Osteoporosis    Peripheral neuropathy 12/27/2017   Peripheral neuropathy    Pneumonia    Restless leg syndrome    Varicose veins     Patient Active Problem List   Diagnosis Date Noted   Confusion 05/20/2019   Post concussive syndrome 05/20/2019   Peripheral neuropathy 12/27/2017   MGUS (monoclonal gammopathy of unknown significance) 12/27/2017   Lumbar stenosis with neurogenic claudication 12/29/2015   Coronary artery disease involving native heart 03/07/2014   Hyperlipidemia 03/07/2014   Essential hypertension 03/07/2014   Fracture of vertebra    Osteoporosis    DI (detrusor instability)    Menopausal symptoms     Past Surgical History:  Procedure Laterality Date   ABDOMINAL HYSTERECTOMY  1992   TAH,BSO   BLADDER SUSPENSION  2011   CRYOMESH   CARPAL TUNNEL RELEASE  Bilateral 1982   CHOLECYSTECTOMY  1992   COLONOSCOPY     EYE SURGERY Bilateral    Cataract removal   KYPHOPLASTY     X 3   LUMBAR LAMINECTOMY WITH COFLEX 1 LEVEL N/A 12/29/2015   Procedure: L3-4 Laminectomy with Coflex;  Surgeon: Kristeen Miss, MD;  Location: [facility];  Service: Neurosurgery;  Laterality: N/A;  L3-4 Laminectomy with coflex   OOPHORECTOMY     BSO   REPLACEMENT TOTAL KNEE Bilateral 2008,  2011   RIGHT 2008, LEFT 2011   REVERSE SHOULDER ARTHROPLASTY Right 01/30/2020   Procedure: REVERSE SHOULDER ARTHROPLASTY;  Surgeon: Nicholes Stairs, MD;  Location: [facility];  Service: Orthopedics;  Laterality: Right;  2.5 hrs   SPINAL FUSION  2011   VERTEBRAL SURGERY     T-8, T-10. T-12  DR Roselee Culver     OB History     Gravida  2   Para  2   Term  2   Preterm      AB      Living  2      SAB      IAB       Ectopic      Multiple      Live Births              Family History  Problem Relation Age of Onset   Heart disease Mother    Heart disease Father    Stroke Father    Hypertension Sister    Diabetes Sister    Ovarian cancer Sister    Diabetes Sister    Rectal cancer Sister        ? Colon or rectal   Hyperlipidemia Sister    Hypertension Sister    Hyperlipidemia Sister    Hypertension Sister    Hypertension Brother    Heart disease Brother    Heart disease Brother    Hypertension Brother    Heart disease Brother    Heart disease Brother    Heart disease Brother    Heart disease Brother    Breast cancer Niece    Heart disease Son    Hyperlipidemia Son    Hyperlipidemia Daughter    Hypertension Daughter     Social History   Tobacco Use   Smoking status: Never   Smokeless tobacco: Never  Vaping Use   Vaping Use: Never used  Substance Use Topics   Alcohol use: No   Drug use: No    Home Medications Prior to Admission medications   Medication Sig Start Date End Date Taking? Authorizing Provider  ALPRAZolam Duanne Moron) 0.5 MG tablet Take 0.5 mg by mouth 2 (two) times daily as needed (restless legs).    [provider]  amLODipine (NORVASC) 5 MG tablet Take 1 tablet (5 mg total) by mouth daily. Patient taking differently: Take 2.5 mg by mouth in the morning and at bedtime. 12/07/20   Belva Crome, MD  aspirin EC 81 MG tablet Take 81 mg by mouth daily. Swallow whole.    [provider]  calcium carbonate (OS-CAL - DOSED IN MG OF ELEMENTAL CALCIUM) 1250 MG tablet Take 1 tablet by mouth daily.      [provider]  celecoxib (CELEBREX) 200 MG capsule Take 200 mg by mouth daily as needed for moderate pain. 12/19/19   [provider]  dexlansoprazole (DEXILANT) 60 MG capsule Take 60 mg by mouth daily.    [provider]  escitalopram (LEXAPRO) 10  MG tablet Take 10 mg by mouth daily.    [provider]   levothyroxine (SYNTHROID, LEVOTHROID) 25 MCG tablet Take 25 mcg by mouth daily before breakfast.     [provider]  MAGNESIUM PO Take 1 tablet by mouth daily.    [provider]  metoprolol succinate (TOPROL-XL) 25 MG 24 hr tablet Take 25 mg by mouth daily.    [provider]  Multiple Vitamin (MULTIVITAMIN) tablet Take 1 tablet by mouth daily.      [provider]  NONFORMULARY OR COMPOUNDED ITEM Transdermal therapeutics  meloxicam 0.5%, doxepin 3%, amantadine 3%, dextromethorphan 2%, lidocaine 2%.    [provider]  rosuvastatin (CRESTOR) 20 MG tablet Take 20 mg by mouth 2 (two) times a week.    [provider]  triamterene-hydrochlorothiazide (MAXZIDE-25) 37.5-25 MG per tablet Take 1 tablet by mouth daily.      [provider]  vitamin C (ASCORBIC ACID) 500 MG tablet Take 1,000 mg by mouth daily.     [provider]  Vitamin D, Ergocalciferol, (DRISDOL) 50000 UNITS CAPS Take 50,000 Units by mouth every 7 (seven) days.     [provider]  Wheat Dextrin (BENEFIBER) POWD Take 1 daily dose 02/09/21   Thornton Park, MD  zolpidem (AMBIEN CR) 12.5 MG CR tablet Take 12.5 mg by mouth at bedtime.    [provider]    Allergies    Patient has no known allergies.  Review of Systems   Review of Systems  Unable to perform ROS: Mental status change   Physical Exam Updated Vital Signs BP (!) 155/101   Pulse 85   Temp 98.2 F (36.8 C) (Oral)   Resp 16   Ht 5' (1.524 m)   Wt 62.3 kg   SpO2 98%   BMI 26.82 kg/m   Physical Exam Vitals and nursing note reviewed.  Constitutional:      General: She is not in acute distress.    Appearance: She is well-developed. She is not diaphoretic.  HENT:     Head: Normocephalic.     Comments: Hematoma and small cut with dried blood the the R parietal region    Right Ear: External ear normal.     Left Ear: External ear normal.     Nose: Nose normal.      Mouth/Throat:     Mouth: Mucous membranes are moist.  Eyes:     General: No scleral icterus.    Extraocular Movements: Extraocular movements intact.     Conjunctiva/sclera: Conjunctivae normal.     Pupils: Pupils are equal, round, and reactive to light.  Cardiovascular:     Rate and Rhythm: Normal rate and regular rhythm.     Heart sounds: Normal heart sounds. No murmur heard.   No friction rub. No gallop.  Pulmonary:     Effort: Pulmonary effort is normal. No respiratory distress.     Breath sounds: Normal breath sounds.  Abdominal:     General: Bowel sounds are normal. There is no distension.     Palpations: Abdomen is soft. There is no mass.     Tenderness: There is no abdominal tenderness. There is no guarding.  Musculoskeletal:     Cervical back: Normal range of motion.  Skin:    General: Skin is warm and dry.     Comments: Skin tears along the left arm   Neurological:     Mental Status: She is alert and oriented to person, place, and  time.     GCS: GCS eye subscore is 4. GCS verbal subscore is 5. GCS motor subscore is 6.     Cranial Nerves: Cranial nerves 2-12 are intact.     Sensory: Sensation is intact.     Motor: Weakness (4/5 strength in every extremity) present.     Comments: Patient able to walk with 2 person assist Drags the left leg Weakness with plantar flexion and Leg extension on the left leg  Psychiatric:        Behavior: Behavior normal.    ED Results / Procedures / Treatments   Labs (all labs ordered are listed, but only abnormal results are displayed) Labs Reviewed  RESP PANEL BY RT-PCR (FLU A&B, COVID) ARPGX2  CBC WITH DIFFERENTIAL/PLATELET    EKG None  Radiology DG Knee Complete 4 Views Left  Result Date: 04/19/2021 CLINICAL DATA:  Trauma, fall, pain EXAM: LEFT KNEE - COMPLETE 4+ VIEW COMPARISON:  None. FINDINGS: No recent fracture or dislocation is seen. There is previous left knee arthroplasty. There is no significant effusion in the  suprapatellar bursa. IMPRESSION: Previous left knee arthroplasty. No recent fracture or dislocation is seen. Electronically Signed   By: Elmer Picker M.D.   On: 04/19/2021 13:30    Procedures Procedures   Medications Ordered in ED Medications - No data to display  ED Course  I have reviewed the triage vital signs and the nursing notes.  Pertinent labs & imaging results that were available during my care of the patient were reviewed by me and considered in my medical decision making (see chart for details).    MDM Rules/Calculators/A&P                           Patient here with progressively worsening weakness of the left leg over the past 2 days.  She has a known history of lumbar spinal surgery and had an MRI about 3 weeks ago.  Patient has had some trouble with the left leg but now it is acutely worse.  On physical examination patient had weakness with extension of the knee and dorsiflexion of the left ankle.  She is unable to ambulate without two-person assist and drags the leg.  I discussed the case with Dr. Curly Shores who feels that this is unlikely to be an acute stroke given the fact that her upper extremity is not involved and she did not see any evidence of change on the plain film.  Also discussed the case with nurse practitioner Meyran.  Will recommend follow-up imaging to rule out epidural abscess or hematoma.  Patient case discussed with Dr. Tamera Punt.  To be transferred to [facility] ER for MRI lumbar spine with and without.  Reviewed the patient's labs that shows positive for opiates and benzos.  UA positive for nitrates but she has no urinary tract symptoms.  She was more confused earlier today but is back to baseline.  This may be drug related.  She does not appear to be in acute withdrawal.  Ammonia respiratory panel within normal limits.  BMP without significant abnormality.  Patient had verified vital signs that showed a pulse of 128 and oxygen saturations of 88%.  This does not  appear to be accurate.  Patient is normotensive, and not hypoxic on monitor at bedside Final Clinical Impression(s) / ED Diagnoses Final diagnoses:  None    Rx / DC Orders ED Discharge Orders     None  Margarita Mail, PA-C 04/19/21 1819    Godfrey Pick, MD 04/20/21 [phone]

## 2021-04-19 NOTE — ED Provider Notes (Signed)
Patient presents from [facility] emergency room for evaluation of left lower extremity weakness and recurrent falls with MRI.  Patient's case was discussed with neurosurgeon at other emergency rooms and recommended referral for MRI.  Physical Exam  BP 130/80   Pulse 96   Temp 98.2 F (36.8 C) (Oral)   Resp (!) 25   Ht 5' (1.524 m)   Wt 62.3 kg   SpO2 98%   BMI 26.82 kg/m    ED Course/Procedures     Procedures  MDM  Patient's lumbar MRI with L4 nerve root compression.  Discussed with neurosurgeon who believes that this is likely chronic and not the source of patient's symptoms.  Recommend finding an alternative etiology for patient's symptoms.  We will proceed with MRI brain without contrast to evaluate for CVA.  We will also ambulate patient to evaluate gait.  Patient on exam does have 5/5 plantar flexion, dorsiflexion, and hip flexion bilateral lower extremities.  She was a two-person assist at other emergency room prior to arrival and was unable to dorsiflex her left foot and was dragging her left foot.   Patient ambulated in the emergency room.  She remains a two-person assist with significant weakness.  Of note prior to today patient was independent with her ambulation.  She did start using [place] as of last night but did have a fall that required her to drag across the floor to get back to the bedroom.  Notified by radiology they are unable to proceed with MRI brain without contrast given that she had recent MRI with contrast.  Will discuss with hospitalist for potential admission and MRI when appropriate from radiology standpoint.       Evlyn Courier, PA-C 04/19/21 2310    Malvin Johns, MD 04/19/21 2312

## 2021-04-19 NOTE — ED Triage Notes (Signed)
Patient BIB [facility] from [facility] Left Knee Pain.  Patient states she was at a [employer] on Saturday and Patient states her Left Knee has become stiff. Patient is able to bear weight.   Patient did also Fall today this AM and sustained a Head Injury to Right Head. No History of Blooding Thinning Medication. No LOC.  NAD Noted during Triage. BIB Wheelchair. A&Ox4. GCS 15. No Neurological Symptoms noted during Triage. History of L3 and L4 Fusion.

## 2021-04-20 ENCOUNTER — Inpatient Hospital Stay (HOSPITAL_COMMUNITY): Payer: Medicare Other

## 2021-04-20 ENCOUNTER — Encounter (HOSPITAL_COMMUNITY): Payer: Self-pay | Admitting: Family Medicine

## 2021-04-20 ENCOUNTER — Observation Stay (HOSPITAL_COMMUNITY): Payer: Medicare Other

## 2021-04-20 DIAGNOSIS — W1839XA Other fall on same level, initial encounter: Secondary | ICD-10-CM | POA: Diagnosis present

## 2021-04-20 DIAGNOSIS — M4312 Spondylolisthesis, cervical region: Secondary | ICD-10-CM | POA: Diagnosis not present

## 2021-04-20 DIAGNOSIS — K5900 Constipation, unspecified: Secondary | ICD-10-CM | POA: Diagnosis present

## 2021-04-20 DIAGNOSIS — G8929 Other chronic pain: Secondary | ICD-10-CM | POA: Diagnosis present

## 2021-04-20 DIAGNOSIS — I10 Essential (primary) hypertension: Secondary | ICD-10-CM | POA: Diagnosis present

## 2021-04-20 DIAGNOSIS — M810 Age-related osteoporosis without current pathological fracture: Secondary | ICD-10-CM | POA: Diagnosis present

## 2021-04-20 DIAGNOSIS — D472 Monoclonal gammopathy: Secondary | ICD-10-CM | POA: Diagnosis present

## 2021-04-20 DIAGNOSIS — Z83438 Family history of other disorder of lipoprotein metabolism and other lipidemia: Secondary | ICD-10-CM | POA: Diagnosis not present

## 2021-04-20 DIAGNOSIS — G9389 Other specified disorders of brain: Secondary | ICD-10-CM | POA: Diagnosis not present

## 2021-04-20 DIAGNOSIS — R531 Weakness: Secondary | ICD-10-CM | POA: Diagnosis not present

## 2021-04-20 DIAGNOSIS — F32A Depression, unspecified: Secondary | ICD-10-CM | POA: Diagnosis present

## 2021-04-20 DIAGNOSIS — Z823 Family history of stroke: Secondary | ICD-10-CM | POA: Diagnosis not present

## 2021-04-20 DIAGNOSIS — K449 Diaphragmatic hernia without obstruction or gangrene: Secondary | ICD-10-CM | POA: Diagnosis present

## 2021-04-20 DIAGNOSIS — Z8249 Family history of ischemic heart disease and other diseases of the circulatory system: Secondary | ICD-10-CM | POA: Diagnosis not present

## 2021-04-20 DIAGNOSIS — E039 Hypothyroidism, unspecified: Secondary | ICD-10-CM | POA: Diagnosis present

## 2021-04-20 DIAGNOSIS — Y92009 Unspecified place in unspecified non-institutional (private) residence as the place of occurrence of the external cause: Secondary | ICD-10-CM | POA: Diagnosis not present

## 2021-04-20 DIAGNOSIS — E871 Hypo-osmolality and hyponatremia: Secondary | ICD-10-CM | POA: Diagnosis present

## 2021-04-20 DIAGNOSIS — G4700 Insomnia, unspecified: Secondary | ICD-10-CM | POA: Diagnosis present

## 2021-04-20 DIAGNOSIS — S41112A Laceration without foreign body of left upper arm, initial encounter: Secondary | ICD-10-CM | POA: Diagnosis present

## 2021-04-20 DIAGNOSIS — E876 Hypokalemia: Secondary | ICD-10-CM | POA: Diagnosis present

## 2021-04-20 DIAGNOSIS — R29898 Other symptoms and signs involving the musculoskeletal system: Secondary | ICD-10-CM | POA: Diagnosis not present

## 2021-04-20 DIAGNOSIS — H9190 Unspecified hearing loss, unspecified ear: Secondary | ICD-10-CM | POA: Diagnosis present

## 2021-04-20 DIAGNOSIS — I6389 Other cerebral infarction: Secondary | ICD-10-CM | POA: Diagnosis not present

## 2021-04-20 DIAGNOSIS — I639 Cerebral infarction, unspecified: Secondary | ICD-10-CM | POA: Diagnosis not present

## 2021-04-20 DIAGNOSIS — R519 Headache, unspecified: Secondary | ICD-10-CM | POA: Diagnosis not present

## 2021-04-20 DIAGNOSIS — M2669 Other specified disorders of temporomandibular joint: Secondary | ICD-10-CM | POA: Diagnosis not present

## 2021-04-20 DIAGNOSIS — I6310 Cerebral infarction due to embolism of unspecified precerebral artery: Principal | ICD-10-CM

## 2021-04-20 DIAGNOSIS — I2510 Atherosclerotic heart disease of native coronary artery without angina pectoris: Secondary | ICD-10-CM | POA: Diagnosis present

## 2021-04-20 DIAGNOSIS — I6523 Occlusion and stenosis of bilateral carotid arteries: Secondary | ICD-10-CM | POA: Diagnosis not present

## 2021-04-20 DIAGNOSIS — E860 Dehydration: Secondary | ICD-10-CM | POA: Diagnosis present

## 2021-04-20 DIAGNOSIS — G2581 Restless legs syndrome: Secondary | ICD-10-CM | POA: Diagnosis present

## 2021-04-20 DIAGNOSIS — E785 Hyperlipidemia, unspecified: Secondary | ICD-10-CM | POA: Diagnosis present

## 2021-04-20 DIAGNOSIS — M25552 Pain in left hip: Secondary | ICD-10-CM | POA: Diagnosis not present

## 2021-04-20 DIAGNOSIS — Z20822 Contact with and (suspected) exposure to covid-19: Secondary | ICD-10-CM | POA: Diagnosis present

## 2021-04-20 DIAGNOSIS — R29701 NIHSS score 1: Secondary | ICD-10-CM | POA: Diagnosis not present

## 2021-04-20 DIAGNOSIS — G8314 Monoplegia of lower limb affecting left nondominant side: Secondary | ICD-10-CM | POA: Diagnosis present

## 2021-04-20 LAB — CBC
HCT: 41.6 % (ref 36.0–46.0)
Hemoglobin: 13.8 g/dL (ref 12.0–15.0)
MCH: 31.7 pg (ref 26.0–34.0)
MCHC: 33.2 g/dL (ref 30.0–36.0)
MCV: 95.6 fL (ref 80.0–100.0)
Platelets: 322 10*3/uL (ref 150–400)
RBC: 4.35 MIL/uL (ref 3.87–5.11)
RDW: 13.3 % (ref 11.5–15.5)
WBC: 7.5 10*3/uL (ref 4.0–10.5)
nRBC: 0 % (ref 0.0–0.2)

## 2021-04-20 LAB — BASIC METABOLIC PANEL
Anion gap: 9 (ref 5–15)
BUN: 15 mg/dL (ref 8–23)
CO2: 30 mmol/L (ref 22–32)
Calcium: 9.6 mg/dL (ref 8.9–10.3)
Chloride: 94 mmol/L — ABNORMAL LOW (ref 98–111)
Creatinine, Ser: 0.69 mg/dL (ref 0.44–1.00)
GFR, Estimated: >60 mL/min (ref >60)
Glucose, Bld: 164 mg/dL — ABNORMAL HIGH (ref 70–99)
Potassium: 3.5 mmol/L (ref 3.5–5.1)
Sodium: 133 mmol/L — ABNORMAL LOW (ref 135–145)

## 2021-04-20 MED ORDER — SODIUM CHLORIDE 0.9 % IV SOLN: 250.0000 mL | INTRAVENOUS | Status: DC | PRN

## 2021-04-20 MED ORDER — CLOPIDOGREL BISULFATE 75 MG PO TABS: 75.0000 mg | ORAL_TABLET | Freq: Every day | ORAL | Status: DC

## 2021-04-20 MED ORDER — ENOXAPARIN SODIUM 40 MG/0.4ML IJ SOSY
40.0000 mg | PREFILLED_SYRINGE | INTRAMUSCULAR | Status: DC
  Administered 2021-04-20 – 2021-04-21 (×2): 40 mg via SUBCUTANEOUS
  Filled 2021-04-20 (×2): qty 0.4

## 2021-04-20 MED ORDER — HYDROMORPHONE HCL 1 MG/ML IJ SOLN
0.5000 mg | INTRAMUSCULAR | Status: DC | PRN
  Administered 2021-04-20: 0.5 mg via INTRAVENOUS
  Filled 2021-04-20: qty 0.5

## 2021-04-20 MED ORDER — METOPROLOL SUCCINATE ER 25 MG PO TB24
25.0000 mg | ORAL_TABLET | Freq: Every morning | ORAL | Status: DC
  Administered 2021-04-20 – 2021-04-22 (×3): 25 mg via ORAL
  Filled 2021-04-20 (×3): qty 1

## 2021-04-20 MED ORDER — SODIUM CHLORIDE 0.9% FLUSH: 3.0000 mL | INTRAVENOUS | Status: DC | PRN

## 2021-04-20 MED ORDER — ONDANSETRON HCL 4 MG PO TABS: 4.0000 mg | ORAL_TABLET | Freq: Four times a day (QID) | ORAL | Status: DC | PRN

## 2021-04-20 MED ORDER — ONDANSETRON HCL 4 MG/2ML IJ SOLN: 4.0000 mg | Freq: Four times a day (QID) | INTRAMUSCULAR | Status: DC | PRN

## 2021-04-20 MED ORDER — ESCITALOPRAM OXALATE 10 MG PO TABS
10.0000 mg | ORAL_TABLET | Freq: Every day | ORAL | Status: DC
  Administered 2021-04-20 – 2021-04-21 (×3): 10 mg via ORAL
  Filled 2021-04-20 (×3): qty 1

## 2021-04-20 MED ORDER — SENNOSIDES-DOCUSATE SODIUM 8.6-50 MG PO TABS: 1.0000 | ORAL_TABLET | Freq: Every evening | ORAL | Status: DC | PRN

## 2021-04-20 MED ORDER — ALPRAZOLAM 0.25 MG PO TABS
0.2500 mg | ORAL_TABLET | Freq: Every evening | ORAL | Status: DC | PRN
  Administered 2021-04-20 (×3): 0.25 mg via ORAL
  Filled 2021-04-20 (×3): qty 1

## 2021-04-20 MED ORDER — ASPIRIN EC 81 MG PO TBEC
81.0000 mg | DELAYED_RELEASE_TABLET | Freq: Every morning | ORAL | Status: DC
  Administered 2021-04-20 – 2021-04-22 (×3): 81 mg via ORAL
  Filled 2021-04-20 (×3): qty 1

## 2021-04-20 MED ORDER — IOHEXOL 350 MG/ML SOLN
75.0000 mL | Freq: Once | INTRAVENOUS | Status: AC | PRN
  Administered 2021-04-20: 75 mL via INTRAVENOUS

## 2021-04-20 MED ORDER — ATORVASTATIN CALCIUM 40 MG PO TABS
40.0000 mg | ORAL_TABLET | Freq: Every day | ORAL | Status: DC
  Administered 2021-04-20 – 2021-04-22 (×3): 40 mg via ORAL
  Filled 2021-04-20 (×3): qty 1

## 2021-04-20 MED ORDER — TRIAMTERENE-HCTZ 37.5-25 MG PO TABS
1.0000 | ORAL_TABLET | Freq: Every morning | ORAL | Status: DC
  Administered 2021-04-20: 1 via ORAL
  Filled 2021-04-20: qty 1

## 2021-04-20 MED ORDER — ACETAMINOPHEN 325 MG PO TABS
650.0000 mg | ORAL_TABLET | Freq: Four times a day (QID) | ORAL | Status: DC | PRN
  Administered 2021-04-20: 650 mg via ORAL
  Filled 2021-04-20: qty 2

## 2021-04-20 MED ORDER — LEVOTHYROXINE SODIUM 25 MCG PO TABS
25.0000 ug | ORAL_TABLET | Freq: Every day | ORAL | Status: DC
  Administered 2021-04-20 – 2021-04-22 (×3): 25 ug via ORAL
  Filled 2021-04-20 (×3): qty 1

## 2021-04-20 MED ORDER — SODIUM CHLORIDE 0.9% FLUSH
3.0000 mL | Freq: Two times a day (BID) | INTRAVENOUS | Status: DC
  Administered 2021-04-20 – 2021-04-22 (×6): 3 mL via INTRAVENOUS

## 2021-04-20 MED ORDER — PANTOPRAZOLE SODIUM 40 MG PO TBEC
40.0000 mg | DELAYED_RELEASE_TABLET | Freq: Every day | ORAL | Status: DC
  Administered 2021-04-20 – 2021-04-22 (×3): 40 mg via ORAL
  Filled 2021-04-20 (×3): qty 1

## 2021-04-20 MED ORDER — ACETAMINOPHEN 650 MG RE SUPP: 650.0000 mg | Freq: Four times a day (QID) | RECTAL | Status: DC | PRN

## 2021-04-20 MED ORDER — POTASSIUM CHLORIDE CRYS ER 20 MEQ PO TBCR
20.0000 meq | EXTENDED_RELEASE_TABLET | Freq: Once | ORAL | Status: AC
  Administered 2021-04-20: 20 meq via ORAL
  Filled 2021-04-20: qty 1

## 2021-04-20 MED ORDER — AMLODIPINE BESYLATE 5 MG PO TABS
2.5000 mg | ORAL_TABLET | Freq: Two times a day (BID) | ORAL | Status: DC
  Administered 2021-04-20: 2.5 mg via ORAL
  Filled 2021-04-20: qty 1

## 2021-04-20 MED ORDER — CLOPIDOGREL BISULFATE 75 MG PO TABS
75.0000 mg | ORAL_TABLET | Freq: Every day | ORAL | Status: DC
  Administered 2021-04-20 – 2021-04-22 (×3): 75 mg via ORAL
  Filled 2021-04-20 (×3): qty 1

## 2021-04-20 NOTE — Plan of Care (Signed)
  Problem: Education: Goal: Knowledge of General Education information will improve Description: Including pain rating scale, medication(s)/side effects and non-pharmacologic comfort measures Outcome: Progressing   Problem: Clinical Measurements: Goal: Diagnostic test results will improve Outcome: Progressing   Problem: Coping: Goal: Level of anxiety will decrease Outcome: Progressing   

## 2021-04-20 NOTE — Progress Notes (Signed)
PROGRESS NOTE    GLENDA SPELMAN  JSE:[id number] DOB: [birth year] DOA: 04/19/2021 PCP: Burnard Bunting, MD   Brief Narrative: HPI by Dr. Tonie Griffith  on 04/19/2021 Jonny Ruiz Kiel is a 87 y.o. female with medical history significant for HTN, HLD, hypothyroidism, peripheral neuropathy, hx of vertebral fractures who presents for evaluation of leg weakness.  She reports that she went to see her grandsons baseball game Saturday evening and on the way back to her car she felt her left leg was becoming very weak and her son had to help her to the car.  After she arrived home she had worsening left leg weakness and had to drag her leg when she ambulated.  She is always been independent and lives by herself and has not had difficulty with ambulation until this occurred Saturday night.  She does report having some lower back pain that is chronic and she is followed by neurosurgery.  She does have history of spinal stenosis in her lumbar spine.  She denies having any injury or trauma to her back.  She denies any numbness of her pelvic or thigh region.  She denies any difficulty with urination or bowel movements.  She has not had any fevers or chills.  She did fall yesterday hitting her head but had no loss of consciousness.  She is able to provide history and answer questions appropriately.  She does state that 2 days ago her son and grandson those she seemed little confused but she feels like she is back to her baseline now.  She had an MRI with contrast of her lumbar spine show any acute changes.  This was discussed with neurosurgery who did not feel there was any need for neurosurgical intervention at this time.  MRI of the brain was ordered to make sure there is no stroke as the etiology of her symptoms but MRI of the brain cannot be obtained until the morning secondary to receiving IV contrast this afternoon Denies tobacco alcohol or illicit drug use   ED Course: Ms. Dutko is been hemodynamically stable while in  the emergency room and has no complaints at this time other than weakness in her left leg.  She was ambulated in the emergency room and requires 2 person assist to ambulate which is not normal for her.  Lab work reveals sodium 135 potassium 3.4 chloride 95 bicarb 29 creatinine 0.79 BUN 14 glucose 105.  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 Continue aspirin and Lipitor CT of the lumbar spine with post surgical changes of L3-L5, stable residual spinal stenosis. CT cervical spine no acute findings. CT head no acute findings. Hemoglobin A1c TSH and lipid panel pending. LDL 130 10/2020. Discussed with neurology will see the patient at [facility] no need to transfer her to [facility] at this time. Echo CT angiogram of the head and neck ordered.  MRI brain multiple small acute infarcts within the right greater than left cerebral hemisphere and within the right cerebellum measuring up to 14 mm.  Multiple vascular territories involved and suspicious for embolic process.  Mild to moderate chronic small vessel ischemic changes within the cerebral white matter.  #2 history of  essential hypertension on Norvasc metoprolol and Maxide, blood pressure 122/76. I have stopped her Norvasc and Maxide restart as needed.  #3 depression on Lexapro  #4 hypokalemia repleted.  #5 hypothyroidism continue Synthroid.  Estimated body mass index is 26.12 kg/m as calculated from the following:   Height as of this encounter: 5\' 1"  (1.549 m).   Weight as of this encounter: 62.7 kg.  DVT prophylaxis: Lovenox  code Status: Full code  Family communication discussed with son  over the phone Disposition Plan:  Status is: Inpatient The patient will require care spanning > 2 midnights and should be moved to inpatient because: New stroke further work-up pending   Consultants:  neuro  Procedures: None Antimicrobials: None Subjective: She is resting in bed Extremely hard of hearing anxious to go home Wants to be with the family for Thanksgiving Her speech is clear no dysarthria noted she continues to complain of left thigh pain She has decreased range of motion to left knee has had history of knee surgeries bilateral in the past  Objective: Vitals:   04/20/21 0100 04/20/21 0311 04/20/21 0613 04/20/21 1043  BP:  130/73 122/81 122/76  Pulse:  95 93 87  Resp:  16 16 16   Temp:  98.1 F (36.7 C) 98.7 F (37.1 C) 98.6 F (37 C)  TempSrc:  Oral Oral   SpO2:  93% 96% 98%  Weight: 62.7 kg     Height: 5\' 1"  (1.549 m)       Intake/Output Summary (Last 24 hours) at 04/20/2021 1246 Last data filed at 04/20/2021 0100 Gross per 24 hour  Intake 240 ml  Output --  Net 240 ml   Filed Weights   04/19/21 1233 04/20/21 0100  Weight: 62.3 kg 62.7 kg    Examination:  General exam: Appears anxious wants to go home Respiratory system: Clear to auscultation. Respiratory effort normal. Cardiovascular system: S1 & S2 heard, RRR. No JVD, murmurs, rubs, gallops or clicks. No pedal edema. Gastrointestinal system: Abdomen is nondistended, soft and nontender. No organomegaly or masses felt. Normal bowel sounds heard. Central nervous system: Alert and oriented.  She moves all extremities.  No dysarthria or facial droop noted.  She is able to lift both feet off the bed. 4 x 5 left and right lower extremity 5 x 5 bilateral upper extremity Extremities: no edema  Skin: No rashes, lesions or ulcers Psychiatry: Judgement and insight appear normal. Mood & affect appropriate.     Data Reviewed: I have personally reviewed following labs and imaging studies  CBC: Recent Labs   Lab 04/20/21 0316  WBC 7.5  HGB 13.8  HCT 41.6  MCV 95.6  PLT 976   Basic Metabolic Panel: Recent Labs  Lab 04/19/21 1450 04/20/21 0316  NA 135 133*  K 3.4* 3.5  CL 95* 94*  CO2 29 30  GLUCOSE 105* 164*  BUN 14 15  CREATININE 0.79 0.69  CALCIUM 10.6* 9.6   GFR: Estimated Creatinine Clearance: 43.7 mL/min (by C-G formula based on SCr of 0.69 mg/dL). Liver Function Tests: No results for input(s): AST, ALT, ALKPHOS, BILITOT, PROT, ALBUMIN in the last 168 hours. No results for input(s): LIPASE, AMYLASE in the last 168 hours. Recent Labs  Lab 04/19/21 1515  AMMONIA 15   Coagulation Profile: No results for input(s): INR, PROTIME in the last 168 hours. Cardiac Enzymes: No results for input(s): CKTOTAL, CKMB, CKMBINDEX, TROPONINI in the last 168 hours. BNP (last 3 results) No results for input(s): PROBNP in the last  8760 hours. HbA1C: No results for input(s): HGBA1C in the last 72 hours. CBG: No results for input(s): GLUCAP in the last 168 hours. Lipid Profile: No results for input(s): CHOL, HDL, LDLCALC, TRIG, CHOLHDL, LDLDIRECT in the last 72 hours. Thyroid Function Tests: No results for input(s): TSH, T4TOTAL, FREET4, T3FREE, THYROIDAB in the last 72 hours. Anemia Panel: No results for input(s): VITAMINB12, FOLATE, FERRITIN, TIBC, IRON, RETICCTPCT in the last 72 hours. Sepsis Labs: No results for input(s): PROCALCITON, LATICACIDVEN in the last 168 hours.  Recent Results (from the past 240 hour(s))  Resp Panel by RT-PCR (Flu A&B, Covid) Nasopharyngeal Swab     Status: None   Collection Time: 04/19/21  2:59 PM   Specimen: Nasopharyngeal Swab; Nasopharyngeal(NP) swabs in vial transport medium  Result Value Ref Range Status   SARS Coronavirus 2 by RT PCR NEGATIVE NEGATIVE Final    Comment: (NOTE) SARS-CoV-2 target nucleic acids are NOT DETECTED.  The SARS-CoV-2 RNA is generally detectable in upper respiratory specimens during the acute phase of infection. The  lowest concentration of SARS-CoV-2 viral copies this assay can detect is 138 copies/mL. A negative result does not preclude SARS-Cov-2 infection and should not be used as the sole basis for treatment or other patient management decisions. A negative result may occur with  improper specimen collection/handling, submission of specimen other than nasopharyngeal swab, presence of viral mutation(s) within the areas targeted by this assay, and inadequate number of viral copies(<138 copies/mL). A negative result must be combined with clinical observations, patient history, and epidemiological information. The expected result is Negative.  Fact Sheet for Patients:  EntrepreneurPulse.com.au  Fact Sheet for Healthcare Providers:  IncredibleEmployment.be  This test is no t yet approved or cleared by the [place] FDA and  has been authorized for detection and/or diagnosis of SARS-CoV-2 by FDA under an Emergency Use Authorization (EUA). This EUA will remain  in effect (meaning this test can be used) for the duration of the COVID-19 declaration under Section 564(b)(1) of the Act, 21 U.S.C.section 360bbb-3(b)(1), unless the authorization is terminated  or revoked sooner.       Influenza A by PCR NEGATIVE NEGATIVE Final   Influenza B by PCR NEGATIVE NEGATIVE Final    Comment: (NOTE) The Xpert Xpress SARS-CoV-2/FLU/RSV plus assay is intended as an aid in the diagnosis of influenza from Nasopharyngeal swab specimens and should not be used as a sole basis for treatment. Nasal washings and aspirates are unacceptable for Xpert Xpress SARS-CoV-2/FLU/RSV testing.  Fact Sheet for Patients: EntrepreneurPulse.com.au  Fact Sheet for Healthcare Providers: IncredibleEmployment.be  This test is not yet approved or cleared by the [place] FDA and has been authorized for detection and/or diagnosis of SARS-CoV-2 by FDA under  an Emergency Use Authorization (EUA). This EUA will remain in effect (meaning this test can be used) for the duration of the COVID-19 declaration under Section 564(b)(1) of the Act, 21 U.S.C. section 360bbb-3(b)(1), unless the authorization is terminated or revoked.  Performed at [facility], [address], [place],  [address]          Radiology Studies: CT HEAD WO CONTRAST  Result Date: 04/19/2021 CLINICAL DATA:  Head trauma reported in a 87 year old female. EXAM: CT HEAD WITHOUT CONTRAST TECHNIQUE: Contiguous axial images were obtained from the base of the skull through the vertex without intravenous contrast. COMPARISON:  CT of the head from June of 2020. Also MRI of the brain from June 15, 2019 FINDINGS: Brain: No evidence of acute infarction, hemorrhage,  hydrocephalus, extra-axial collection or mass lesion/mass effect. Vascular: No hyperdense vessel or unexpected calcification. Skull: Normal. Negative for fracture or focal lesion. Sinuses/Orbits: Visualized paranasal sinuses and orbits are unremarkable. Signs of prior LEFT orbital fracture are redemonstrated. Other: Potential small scalp contusion in the soft tissues overlying the RIGHT parietal region near the vertex. IMPRESSION: No acute intracranial abnormality. Evidence of previous LEFT orbital floor fracture without change. Potential scalp hematoma or small contusion in the high RIGHT parietal scalp. Electronically Signed   By: Zetta Bills M.D.   On: 04/19/2021 15:24   CT CERVICAL SPINE WO CONTRAST  Result Date: 04/19/2021 CLINICAL DATA:  Neck trauma. Fall earlier today with head injury. Previous cervical fusion. EXAM: CT CERVICAL SPINE WITHOUT CONTRAST TECHNIQUE: Multidetector CT imaging of the cervical spine was performed without intravenous contrast. Multiplanar CT image reconstructions were also generated. COMPARISON:  Cervical MRI 01/14/2019. Radiographs 11/02/2016. CT 08/15/2009. FINDINGS:  Alignment: Near anatomic. There is a slight anterolisthesis at C4-5 which appears stable. Skull base and vertebrae: No evidence of acute fracture or traumatic subluxation. Previous anterior discectomy and disc replacement at C5-6 and C6-7. Soft tissues and spinal canal: No prevertebral fluid or swelling. No visible canal hematoma. There is synovial calcification around the odontoid process without osseous erosion. Disc levels: Stable mild multilevel spondylosis with disc bulging and uncinate spurring. No large disc herniation or high-grade spinal stenosis. Upper chest: Mild biapical scarring. Other: Bilateral carotid atherosclerosis. IMPRESSION: 1. No evidence of acute cervical spine fracture or traumatic subluxation. 2. Stable degenerative and postsurgical changes as described. No high-grade spinal stenosis identified. Electronically Signed   By: Richardean Sale M.D.   On: 04/19/2021 15:33   CT Lumbar Spine Wo Contrast  Result Date: 04/19/2021 CLINICAL DATA:  Low back pain for more than 6 weeks. Recent fall with leg weakness and recent spinal injection. EXAM: CT LUMBAR SPINE WITHOUT CONTRAST TECHNIQUE: Multidetector CT imaging of the lumbar spine was performed without intravenous contrast administration. Multiplanar CT image reconstructions were also generated. COMPARISON:  Abdominopelvic CT 04/01/2021.  Lumbar MRI 03/10/2021. FINDINGS: Segmentation: There are 5 lumbar type vertebral bodies. Alignment: Stable degenerative anterolisthesis at L3-4. Otherwise normal. Vertebrae: No evidence of acute lumbar spine fracture or traumatic subluxation. Stable T11 fracture status post spinal augmentation. Patient is status post L4-5 PLIF with intact hardware. Inter spinous fusion has been performed at L3-4. Paraspinal and other soft tissues: No significant paraspinal findings. Aortic and branch vessel atherosclerosis noted. Disc levels: T12-L1: The disc appears normal. Mild bilateral facet hypertrophy. No spinal  stenosis. L1-2: Loss of disc height with vacuum phenomenon, annular disc bulging and bilateral facet hypertrophy. Stable borderline spinal stenosis with mild right lateral recess and right foraminal narrowing. L2-3: Loss of disc height with annular disc bulging, endplate osteophytes and bilateral facet hypertrophy. Stable mild spinal stenosis with mild lateral recess and foraminal narrowing on the left. L3-4: Evaluation mildly limited by beam hardening artifact from the previous spinal instrumentation. Chronic adjacent segment disease with loss of disc height, annular disc bulging and endplate osteophytes. Previous posterior decompression and inter spinous fusion. There is stable mild spinal stenosis with asymmetric left lateral recess and left foraminal narrowing. L4-5: Stable appearance status post laminectomy and PLIF. No spinal stenosis or nerve root encroachment. L5-S1: Advanced asymmetric facet hypertrophy on the right. Disc height is maintained. No spinal stenosis or nerve root encroachment. IMPRESSION: 1. No evidence of acute lumbar spine fracture or traumatic subluxation. 2. Previous T11 spinal augmentation. 3. Stable postsurgical changes at L3-4 and L4-5.  Stable residual spinal stenosis as detailed above. Electronically Signed   By: Richardean Sale M.D.   On: 04/19/2021 15:25   MR BRAIN WO CONTRAST  Result Date: 04/20/2021 CLINICAL DATA:  Headache, chronic, no new features. Left lower extremity weakness and falls. Rule out CVA. EXAM: MRI HEAD WITHOUT CONTRAST TECHNIQUE: Multiplanar, multiecho pulse sequences of the brain and surrounding structures were obtained without intravenous contrast. COMPARISON:  Prior head CT examinations 04/19/2021 and earlier. Brain MRI 06/15/2019. FINDINGS: Brain: Mild intermittent motion degradation. There are multiple small acute cortical and subcortical infarcts within the right cerebral hemisphere within the right frontal lobe, right parietal lobe, right temporoparietal  junction, right caudate and lentiform nuclei and right insula. These infarcts involve the right ACA, right MCA and watershed territories. The largest infarct is located within the posterior right frontal lobe, measuring 14 mm. Subcentimeter acute infarcts are also present within the left parietal and anterior left temporal lobes (left MCA/PCA watershed territories). Additional subcentimeter acute infarct within the right cerebellum. Background mild-to-moderate patchy and ill-defined hypoattenuation within the cerebral white matter, nonspecific but compatible chronic small vessel ischemic disease. No evidence of an intracranial mass. No chronic intracranial blood products. No extra-axial fluid collection. No midline shift. Vascular: Maintained flow voids within the proximal large arterial vessels. Skull and upper cervical spine: Focal suspicious marrow lesion. Susceptibility artifact arising from cervical spinal fusion hardware. Sinuses/Orbits: Visualized orbits show no acute finding. Redemonstrated chronic fracture deformity of the left orbital floor. No significant paranasal sinus disease. IMPRESSION: Mildly motion degraded exam. Multiple small acute infarcts within the right greater than left cerebral hemispheres and within the right cerebellum, as described and measuring up to 14 mm. There is involvement of multiple vascular territories, and findings are suspicious for an embolic process. Mild-to-moderate chronic small vessel ischemic changes within the cerebral white matter. Electronically Signed   By: Kellie Simmering D.O.   On: 04/20/2021 11:40   MR Lumbar Spine W Wo Contrast  Result Date: 04/19/2021 CLINICAL DATA:  Lumbar radiculopathy, prior surgery EXAM: MRI LUMBAR SPINE WITHOUT AND WITH CONTRAST TECHNIQUE: Multiplanar and multiecho pulse sequences of the lumbar spine were obtained without and with intravenous contrast. CONTRAST:  6.11mL GADAVIST GADOBUTROL 1 MMOL/ML IV SOLN COMPARISON:  03/10/2021 lumbar  spine MRI, correlation is made with same day CT lumbar spine. FINDINGS: Segmentation:  Standard. Alignment: Dextrocurvature of the lumbar spine. Unchanged mild retrolisthesis L1 on L2. Unchanged grade 1 anterolisthesis L3 on L4. Vertebrae: No acute fracture or suspicious osseous lesion. Status post L4-L5 posterior fusion and decompression. No abnormal enhancement. Conus medullaris and cauda equina: Conus extends to the L1-L2 level. Conus and cauda equina appear normal. No abnormal enhancement. Paraspinal and other soft tissues: Postoperative changes in the soft tissues posterior to the lower lumbar spine. Otherwise negative. Disc levels: T12-L1: No significant disc bulge. No spinal canal stenosis or neural foraminal narrowing. L1-L2: Mild retrolisthesis with broad-based disc bulge. Moderate facet arthropathy. Ligamentum flavum hypertrophy. Mild narrowing of the lateral recesses. Minimal spinal canal stenosis, unchanged. Mild right neural foraminal narrowing, unchanged. L2-L3: Disc height loss with left eccentric disc bulge, with left extreme lateral disc protrusion. Moderate facet arthropathy and ligamentum flavum hypertrophy. Mild narrowing of the left lateral recess, which could affect the descending left L3 nerve. No spinal canal stenosis. No neural foraminal narrowing. L3-L4: Status post fusion of the spinous processes. Unchanged grade 1 anterolisthesis with disc unroofing and moderate disc bulge. Moderate to severe facet arthropathy, left greater than right. Ligamentum flavum hypertrophy. Mild spinal  canal stenosis. Effacement of the left lateral recess. Mild bilateral neural foraminal narrowing, unchanged. L4-L5: Status post decompression and fusion. No spinal canal stenosis or neural foraminal narrowing. L5-S1: Minimal disc bulge. Severe right and mild left facet arthropathy. No spinal canal stenosis or neural foraminal narrowing. IMPRESSION: 1. L3-L4 mild spinal canal stenosis, with effacement of the left  lateral recess that likely compresses the descending left L4 nerve. In addition there is mild bilateral neural foraminal narrowing. Overall this level appears unchanged compared to 03/10/2021. 2. Other levels are also unchanged compared to 03/10/2021. No abnormal enhancement. Electronically Signed   By: Merilyn Baba M.D.   On: 04/19/2021 20:59   DG Knee Complete 4 Views Left  Result Date: 04/19/2021 CLINICAL DATA:  Trauma, fall, pain EXAM: LEFT KNEE - COMPLETE 4+ VIEW COMPARISON:  None. FINDINGS: No recent fracture or dislocation is seen. There is previous left knee arthroplasty. There is no significant effusion in the suprapatellar bursa. IMPRESSION: Previous left knee arthroplasty. No recent fracture or dislocation is seen. Electronically Signed   By: Elmer Picker M.D.   On: 04/19/2021 13:30   DG HIP UNILAT WITH PELVIS 2-3 VIEWS LEFT  Result Date: 04/20/2021 CLINICAL DATA:  Bilateral hip pain.  Fall. EXAM: DG HIP (WITH OR WITHOUT PELVIS) 2-3V LEFT COMPARISON:  None. FINDINGS: Degenerative changes in the hips with joint space narrowing and spurring. Postoperative changes in the lower lumbar spine. No acute bony abnormality. Specifically, no fracture, subluxation, or dislocation. IMPRESSION: No acute bony abnormality. Electronically Signed   By: Rolm Baptise M.D.   On: 04/20/2021 10:56   DG HIP UNILAT WITH PELVIS 2-3 VIEWS RIGHT  Result Date: 04/20/2021 CLINICAL DATA:  Acute bilateral hip pain after fall several days ago. EXAM: DG HIP (WITH OR WITHOUT PELVIS) 2-3V RIGHT COMPARISON:  None. FINDINGS: There is no evidence of hip fracture or dislocation. Mild narrowing and osteophyte formation is seen involving the right hip. 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 No acute abnormality seen. Electronically Signed   By: Marijo Conception M.D.   On: 04/20/2021 10:40        Scheduled Meds:  amLODipine  2.5 mg Oral BID   aspirin EC  81 mg Oral q morning   escitalopram  10  mg Oral QHS   levothyroxine  25 mcg Oral Q0600   metoprolol succinate  25 mg Oral q morning   pantoprazole  40 mg Oral Daily   sodium chloride flush  3 mL Intravenous Q12H   triamterene-hydrochlorothiazide  1 tablet Oral q morning   Continuous Infusions:  sodium chloride       LOS: 0 days    Time spent: 39 min  Georgette Shell, MD 04/20/2021, 12:46 PM

## 2021-04-20 NOTE — Evaluation (Signed)
Physical Therapy Evaluation Patient Details Name: [name] MRN: [id number] DOB: 09-16-1933 Today's Date: 04/20/2021  History of Present Illness  Patient is 87 y.o. female admitted via EMS due to Lt LE weakness on Saturday and a fall on Monday 11/21 due to difficulty ambulating; pt hit her head during fall. MRI Tuesday morning reveals multiple small acute infarcts within the right greater than left cerebral hemispheres and within the right cerebellum. PMH significant for HTN, HLD, CAD, osteoporosis, hypothryoidism, neuropathy, hx of vertebral fractures and back surgery.    Clinical Impression  [name] is 87 y.o. female admitted with above HPI and diagnosis. Patient is currently limited by functional impairments below (see PT problem list). Patient lives alone and is independent at baseline. Currently pt demonstrates impaired coordination of Lt UE/LE and Lt LE weakness>Rt LE. She requires assist for transfers and gait and is more stable with RW for support. Patient will benefit from continued skilled PT interventions to address impairments and progress independence with mobility, recommending intense CIR follow up for rehab as pt was fully independent and living alone PTA. Pt has strong family support and grandchildren and pt's son are coordinating family to stay with pt when able to return home. Acute PT will follow and progress as able.        Recommendations for follow up therapy are one component of a multi-disciplinary discharge planning process, led by the attending physician.  Recommendations may be updated based on patient status, additional functional criteria and insurance authorization.  Follow Up Recommendations  Acute inpatient rehab (3hours/day)    Assistance Recommended at Discharge  Frequent or constant Supervision/Assistance  Functional Status Assessment  Patient has had a recent decline in their functional status and demonstrates the ability to make significant  improvements in function in a reasonable and predictable amount of time.    Equipment Recommendations   None    Recommendations for Other Services  OT consult, Rehab consult      Precautions / Restrictions  Fall       04/20/21 1200  PT Visit Information  Last PT Received On 04/20/21  Assistance Needed +1  History of Present Illness Patient is 87 y.o. female admitted via EMS due to Lt LE weakness on Saturday and a fall on Monday 11/21 due to difficulty ambulating; pt hit her head during fall. MRI Tuesday morning reveals multiple small acute infarcts within the right greater than left cerebral hemispheres and within the right cerebellum. PMH significant for HTN, HLD, CAD, osteoporosis, hypothryoidism, neuropathy, hx of vertebral fractures and back surgery.  Precautions  Precautions Fall  Restrictions  Weight Bearing Restrictions No  Other Position/Activity Restrictions WBAT  Home Living  Family/patient expects to be discharged to: Private residence  Living Arrangements Alone  Available Help at Discharge Family  Type of [place] to enter  Entrance Stairs-Number of Steps [place] One level  [place] (4 wheels)  Prior Function  Prior Level of Function  Independent/Modified Independent  Communication  Communication No difficulties  Pain Assessment  Pain Assessment No/denies pain  Cognition  Arousal/Alertness Awake/alert  Behavior During Therapy WFL for tasks assessed/performed  Overall Cognitive Status Within Functional Limits for tasks assessed  Upper Extremity Assessment  Upper Extremity Assessment Defer to OT evaluation (Lt UE impaired finger to nose, Rt UE normal)  Lower Extremity Assessment  Lower Extremity Assessment LLE deficits/detail;RLE deficits/detail  RLE Deficits / Details hip flex 4, knee  ext 4, df 4  RLE Sensation WNL  RLE Coordination WNL (normal heel to shin)  LLE Deficits / Details  hip flex3+, knee ext 3+, df 4-  LLE Sensation WNL  LLE Coordination decreased gross motor (impaired heel to shin)  Cervical / Trunk Assessment  Cervical / Trunk Assessment Normal;Back Surgery  Bed Mobility  Overal bed mobility Needs Assistance  Bed Mobility Supine to Sit  Supine to sit Min guard;HOB elevated  General bed mobility comments guarding for safety, HOB minimally elevated. pt able to move to sit EOB without cues or assist.  Transfers  Overall transfer level Needs assistance  Equipment used Rolling Vanderberg (2 wheels)  Transfers Sit to/from Stand  Sit to Stand Min guard  General transfer comment guarding for safety with rise. cues for hand placement on RW.  Ambulation/Gait  Ambulation/Gait assistance Min assist  Gait Distance (Feet) 200 Feet  Assistive device Rolling Bates (2 wheels);1 person hand held assist  Gait Pattern/deviations Step-to pattern;Decreased weight shift to left;Knee flexed in stance - left  General Gait Details cues to maintain safe proximity to RW thorughout gait, min assist to steady and cues to direct turns. Min Assist for [place] for short ~60' pt's Lt LE fatigued and began to flex some in stance phase with pt drifting slightly requiring increased support and seated rest.  Gait velocity decr  Balance  Overall balance assessment Needs assistance  Sitting-balance support Feet supported  Sitting balance-Leahy Scale Good  Standing balance support Reliant on assistive device for balance;During functional activity;Bilateral upper extremity supported;Single extremity supported  Standing balance-Leahy Scale Poor  PT - End of Session  Equipment Utilized During Treatment Gait belt  Activity Tolerance Patient tolerated treatment well  Patient left in chair;with call bell/phone within reach;with chair alarm set;with family/visitor present  Nurse Communication Mobility status  PT Assessment  PT Recommendation/Assessment Patient needs continued PT services  PT Visit  Diagnosis Muscle weakness (generalized) (M62.81);Difficulty in walking, not elsewhere classified (R26.2);Hemiplegia and hemiparesis  Hemiplegia - Right/Left Left  Hemiplegia - dominant/non-dominant Non-dominant  Hemiplegia - caused by Cerebral infarction  PT Problem List Decreased strength;Decreased activity tolerance;Decreased balance;Decreased mobility;Decreased coordination;Decreased knowledge of use of DME;Decreased safety awareness  PT Plan  PT Frequency (ACUTE ONLY) Min 4X/week  PT Treatment/Interventions (ACUTE ONLY) DME instruction;Gait training;Stair training;Functional mobility training;Therapeutic activities;Therapeutic exercise;Balance training;Neuromuscular re-education;Patient/family education  AM-PAC PT "6 Clicks" Mobility Outcome Measure (Version 2)  Help needed turning from your back to your side while in a flat bed without using bedrails? 3  Help needed moving from lying on your back to sitting on the side of a flat bed without using bedrails? 3  Help needed moving to and from a bed to a chair (including a wheelchair)? 3  Help needed standing up from a chair using your arms (e.g., wheelchair or bedside chair)? 3  Help needed to walk in hospital room? 3  Help needed climbing 3-5 steps with a railing?  2  6 Click Score 17  Consider Recommendation of Discharge To: Home with [facility]  Progressive Mobility  What is the highest level of mobility based on the progressive mobility assessment? Level 4 (Walks with assist in room) - Balance while marching in place and cannot step forward and back - Complete  Mobility Ambulated with assistance in hallway;Ambulated with assistance in room;Out of bed for toileting;Out of bed to chair with meals  PT Recommendation  Recommendations for Other Services OT consult;Rehab consult  Follow Up Recommendations Acute inpatient rehab (3hours/day)  Assistance recommended  at discharge Frequent or constant Supervision/Assistance  Functional Status Assessment  Patient has had a recent decline in their functional status and demonstrates the ability to make significant improvements in function in a reasonable and predictable amount of time.  PT equipment None recommended by PT  Individuals Consulted  Consulted and Agree with Results and Recommendations Patient;Family member/caregiver  Family Member Consulted pt's grandchildren present  Acute Rehab PT Goals  Patient Stated Goal get back independence  PT Goal Formulation With patient/family  Time For Goal Achievement 05/04/21  Potential to Achieve Goals Good  PT Time Calculation  PT Start Time (ACUTE ONLY) 1218  PT Stop Time (ACUTE ONLY) 1303  PT Time Calculation (min) (ACUTE ONLY) 45 min  PT General Charges  $$ ACUTE PT VISIT 1 Visit  PT Evaluation  $PT Eval Moderate Complexity 1 Mod  PT Treatments  $Gait Training 8-22 mins  $Therapeutic Activity 8-22 mins  Written Expression  Dominant Hand Right     [name] Mould, DPT Acute Rehabilitation Services Office [phone] Pager [phone]   Jacques Navy 04/20/2021, 7:30 PM

## 2021-04-20 NOTE — Progress Notes (Signed)
Patient to CT via w/c. Patient to move to 1435 when CT complete. All belongings in new room. Grandson present during transfer. Report given to [facility].

## 2021-04-20 NOTE — Consult Note (Signed)
Neurology Consultation  Reason for Consult: Left lower extremity weakness, MRI brain with right > left infarcts  Referring Physician: Dr. Zigmund Daniel  CC: Left lower extremity weakness  History is obtained from: Patient, Chart review  HPI: [name] is a 87 y.o. female with a medical history significant for coronary artery disease, essential hypertension, hyperlipidemia, hypothyroidism, remote history of lumbar surgery with hardware loosening s/p spinal injection last week by Dr. Ellene Route, and anxiety who presented to [facility] 11/21 for evaluation of left lower extremity weakness. Patient states that she was at her grandson's baseball game on 11/19 when she noticed left lower extremity weakness as she was walking back to her car.  Her leg weakness continued to progress throughout the night to where she was dragging her left leg with ambulation.  The following day she was hardly able to walk and decided to be seen on Monday 11/21 when she was unable to lift her leg out of the bed in order to stand and fell directly to the ground hitting her head without loss of consciousness.  An MRI was obtained revealing multiple small acute infarcts within the right greater than left cerebral hemispheres and within the right cerebellum with involvement of multiple vascular territories concerning for an embolic process.  Neurology was consulted for further evaluation and management.  LKW: The afternoon of 11/19 TNK given?: no, patient presented outside of the thrombolytic therapy window IR Thrombectomy? No, presentation is not consistent with an LVO. Modified Rankin Scale: 0-Completely asymptomatic and back to baseline post- stroke  ROS: A complete ROS was performed and is negative except as noted in the HPI.   Past Medical History:  Diagnosis Date   Anxiety    Arthritis    Chronic headaches    Colon polyps    Complication of anesthesia    Elevated blood pressure after having last Kyphoplasty; pt stated "I was  given Morphine and had to stay overnight"   Coronary artery disease    DI (detrusor instability)    Fracture of vertebra    x 3   GERD (gastroesophageal reflux disease)    History of hiatal hernia    HLD (hyperlipidemia)    Hypertension    Hypothyroidism    Menopausal symptoms    MGUS (monoclonal gammopathy of unknown significance) 12/27/2017   IgA   Osteoporosis    Peripheral neuropathy 12/27/2017   Peripheral neuropathy    Pneumonia    Restless leg syndrome    Varicose veins    Past Surgical History:  Procedure Laterality Date   ABDOMINAL HYSTERECTOMY  1992   TAH,BSO   BLADDER SUSPENSION  2011   CRYOMESH   CARPAL TUNNEL RELEASE Bilateral 1982   CHOLECYSTECTOMY  1992   COLONOSCOPY     EYE SURGERY Bilateral    Cataract removal   KYPHOPLASTY     X 3   LUMBAR LAMINECTOMY WITH COFLEX 1 LEVEL N/A 12/29/2015   Procedure: L3-4 Laminectomy with Coflex;  Surgeon: Kristeen Miss, MD;  Location: [facility];  Service: Neurosurgery;  Laterality: N/A;  L3-4 Laminectomy with coflex   OOPHORECTOMY     BSO   REPLACEMENT TOTAL KNEE Bilateral 2008,  2011   RIGHT 2008, LEFT 2011   REVERSE SHOULDER ARTHROPLASTY Right 01/30/2020   Procedure: REVERSE SHOULDER ARTHROPLASTY;  Surgeon: Nicholes Stairs, MD;  Location: [facility];  Service: Orthopedics;  Laterality: Right;  2.5 hrs   SPINAL FUSION  2011   VERTEBRAL SURGERY     T-8, T-10.  T-12  DR Roselee Culver   Family History  Problem Relation Age of Onset   Heart disease Mother    Heart disease Father    Stroke Father    Hypertension Sister    Diabetes Sister    Ovarian cancer Sister    Diabetes Sister    Rectal cancer Sister        ? Colon or rectal   Hyperlipidemia Sister    Hypertension Sister    Hyperlipidemia Sister    Hypertension Sister    Hypertension Brother    Heart disease Brother    Heart disease Brother    Hypertension Brother    Heart disease Brother    Heart disease Brother    Heart disease Brother    Heart  disease Brother    Breast cancer Niece    Heart disease Son    Hyperlipidemia Son    Hyperlipidemia Daughter    Hypertension Daughter    Social History:   reports that she has never smoked. She has never used smokeless tobacco. She reports that she does not drink alcohol and does not use drugs.  Medications  Current Facility-Administered Medications:    0.9 %  sodium chloride infusion, 250 mL, Intravenous, PRN, Chotiner, Yevonne Aline, MD   acetaminophen (TYLENOL) tablet 650 mg, 650 mg, Oral, Q6H PRN, 650 mg at 04/20/21 0726 **OR** acetaminophen (TYLENOL) suppository 650 mg, 650 mg, Rectal, Q6H PRN, Chotiner, Yevonne Aline, MD   ALPRAZolam Duanne Moron) tablet 0.25 mg, 0.25 mg, Oral, QHS PRN, Chotiner, Yevonne Aline, MD, 0.25 mg at 04/20/21 6063   aspirin EC tablet 81 mg, 81 mg, Oral, q morning, Chotiner, Yevonne Aline, MD, 81 mg at 04/20/21 0902   atorvastatin (LIPITOR) tablet 40 mg, 40 mg, Oral, Daily, Georgette Shell, MD, 40 mg at 04/20/21 1340   enoxaparin (LOVENOX) injection 40 mg, 40 mg, Subcutaneous, Q24H, Georgette Shell, MD   escitalopram (LEXAPRO) tablet 10 mg, 10 mg, Oral, QHS, Chotiner, Yevonne Aline, MD, 10 mg at 04/20/21 0101   HYDROmorphone (DILAUDID) injection 0.5 mg, 0.5 mg, Intravenous, Q3H PRN, Chotiner, Yevonne Aline, MD, 0.5 mg at 04/20/21 0101   levothyroxine (SYNTHROID) tablet 25 mcg, 25 mcg, Oral, Q0600, Chotiner, Yevonne Aline, MD, 25 mcg at 04/20/21 0612   metoprolol succinate (TOPROL-XL) 24 hr tablet 25 mg, 25 mg, Oral, q morning, Chotiner, Yevonne Aline, MD, 25 mg at 04/20/21 0902   ondansetron (ZOFRAN) tablet 4 mg, 4 mg, Oral, Q6H PRN **OR** ondansetron (ZOFRAN) injection 4 mg, 4 mg, Intravenous, Q6H PRN, Chotiner, Yevonne Aline, MD   pantoprazole (PROTONIX) EC tablet 40 mg, 40 mg, Oral, Daily, Chotiner, Yevonne Aline, MD, 40 mg at 04/20/21 0902   senna-docusate (Senokot-S) tablet 1 tablet, 1 tablet, Oral, QHS PRN, Chotiner, Yevonne Aline, MD   sodium chloride flush (NS) 0.9 % injection 3 mL, 3 mL,  Intravenous, Q12H, Chotiner, Yevonne Aline, MD, 3 mL at 04/20/21 0903   sodium chloride flush (NS) 0.9 % injection 3 mL, 3 mL, Intravenous, PRN, Chotiner, Yevonne Aline, MD  Exam: Current vital signs: BP 130/77 (BP Location: Left Arm)   Pulse 99   Temp 98.5 F (36.9 C) (Oral)   Resp 18   Ht 5\' 1"  (1.549 m)   Wt 62.7 kg   SpO2 96%   BMI 26.12 kg/m  Vital signs in last 24 hours: Temp:  [98.1 F (36.7 C)-98.7 F (37.1 C)] 98.5 F (36.9 C) (11/22 1409) Pulse Rate:  [87-128] 99 (11/22 1409) Resp:  [16-25] 18 (11/22  1409) BP: (119-155)/(73-99) 130/77 (11/22 1409) SpO2:  [93 %-98 %] 96 % (11/22 1409) Weight:  [62.7 kg] 62.7 kg (11/22 0100)  GENERAL: Awake, alert, in no acute distress Psych: Affect appropriate for situation, patient is calm and cooperative with examination Head: Normocephalic and atraumatic, without obvious abnormality EENT: Normal conjunctivae, dry mucous membranes, no OP obstruction LUNGS: Normal respiratory effort. Non-labored breathing on room air CV: Regular rate and rhythm on telemetry ABDOMEN: Soft, non-tender, non-distended Extremities: warm, well perfused, without obvious deformity  NEURO:  Mental Status: Awake, alert, and oriented to person, place, time, and situation. She is able to provide a clear and coherent history of present illness. Speech/Language: speech is intact without dysarthria. Naming, repetition, fluency, and comprehension intact without aphasia\ No neglect is noted Cranial Nerves:  II: PERRL 3 mm/brisk. Visual fields full.  III, IV, VI: EOMI without ptosis, diplopia, or gaze preference.  V: Sensation is intact to light touch and symmetrical to face. VII: Face is symmetric resting and smiling.  VIII: Hearing is intact to voice IX, X: Palate elevation is symmetric. Phonation normal.  XI: Normal sternocleidomastoid and trapezius muscle strength XII: Tongue protrudes midline without fasciculations.   Motor: 5/5 strength present in bilateral  upper extremities, and right lower extremity. There is mild weakness in the left lower extremity with minimal vertical drift with antigravity positioning. Tone is normal. Bulk is normal.  Sensation: Intact to light touch bilaterally in all four extremities. Coordination: FTN intact bilaterally. HKS intact bilaterally.  DTRs: 2+ throughout.  Gait: Deferred  NIHSS: 1a Level of Conscious.: 0 1b LOC Questions: 0 1c LOC Commands: 0 2 Best Gaze: 0 3 Visual: 0 4 Facial Palsy: 0 5a Motor Arm - left: 0 5b Motor Arm - Right: 0 6a Motor Leg - Left: 1 6b Motor Leg - Right: 0 7 Limb Ataxia: 0 8 Sensory: 0 9 Best Language: 0 10 Dysarthria: 0 11 Extinct. and Inatten.: 0 TOTAL: 1  Labs I have reviewed labs in epic and the results pertinent to this consultation are: CBC    Component Value Date/Time   WBC 7.5 04/20/2021 0316   RBC 4.35 04/20/2021 0316   HGB 13.8 04/20/2021 0316   HGB 13.9 03/11/2021 1029   HCT 41.6 04/20/2021 0316   PLT 322 04/20/2021 0316   PLT 291 03/11/2021 1029   MCV 95.6 04/20/2021 0316   MCH 31.7 04/20/2021 0316   MCHC 33.2 04/20/2021 0316   RDW 13.3 04/20/2021 0316   LYMPHSABS 1.7 03/11/2021 1029   MONOABS 0.7 03/11/2021 1029   EOSABS 0.1 03/11/2021 1029   BASOSABS 0.0 03/11/2021 1029   CMP     Component Value Date/Time   NA 133 (L) 04/20/2021 0316   K 3.5 04/20/2021 0316   CL 94 (L) 04/20/2021 0316   CO2 30 04/20/2021 0316   GLUCOSE 164 (H) 04/20/2021 0316   BUN 15 04/20/2021 0316   CREATININE 0.69 04/20/2021 0316   CREATININE 0.96 03/11/2021 1029   CALCIUM 9.6 04/20/2021 0316   PROT 6.5 03/11/2021 1029   PROT 6.6 11/23/2017 0852   ALBUMIN 4.2 03/11/2021 1029   AST 23 03/11/2021 1029   ALT 20 03/11/2021 1029   ALKPHOS 70 03/11/2021 1029   BILITOT 0.6 03/11/2021 1029   GFRNONAA >60 04/20/2021 0316   GFRNONAA 58 (L) 03/11/2021 1029   GFRAA 55 (L) 01/22/2020 1151   GFRAA >60 12/06/2019 0949   Lipid Panel     Component Value Date/Time    CHOL 241 (H) 11/05/2020  7654   TRIG 118 11/05/2020 0953   HDL 87 11/05/2020 0953   CHOLHDL 2.8 11/05/2020 0953   VLDL 24 11/05/2020 0953   LDLCALC 130 (H) 11/05/2020 0953   No results found for: HGBA1C  Imaging I have reviewed the images obtained:  CT-scan of the brain 11/21: - No acute intracranial abnormality. - Evidence of previous LEFT orbital floor fracture without change. - Potential scalp hematoma or small contusion in the high RIGHT parietal scalp.   MRI examination of the brain 11/22: - Mildly motion degraded exam. - Multiple small acute infarcts within the right greater than left cerebral hemispheres and within the right cerebellum, as described and measuring up to 14 mm. There is involvement of multiple vascular territories, and findings are suspicious for an embolic process. - Mild-to-moderate chronic small vessel ischemic changes within the cerebral white matter.   Assessment: Presented for evaluation of left lower extremity weakness with MRI evidence of multiple small acute infarcts within the right greater than left cerebral hemispheres and right cerebellum with involvement of multiple vascular territories concerning for embolic process.  Patient is without known history of A. fib. - Examination reveals patient with an NIHSS of 1 for minimal left lower extremity weakness. - MRI brain as above. - Presentation is most consistent with acute ischemic strokes as seen on MRI with ongoing left lower extremity weakness.  Stroke work-up pending. - Stroke risk factors include patient's history of hypertension, hyperlipidemia, coronary artery disease, and patient's advanced age.  Recommendations: - HgbA1c, fasting lipid panel - CT angio head and neck vessel imaging - Frequent neuro checks - Echocardiogram - Prophylactic therapy- Antiplatelet med: Aspirin - dose 325mg  PO or 300mg  PR followed by ASA 81 mg PO daily in addition to 75 mg clopidogrel daily for 21 days. After 21 days,  may resume ASA 81 mg PO daily monotherapy. - Risk factor modification - Telemetry monitoring - PT consult, OT consult, Speech consult - Stroke team to follow  Pt seen by NP/Neuro and later by MD. Note/plan to be edited by MD as needed.  Anibal Henderson, AGAC-NP [facility] Pager: [phone]  Neurology Attending Attestation   I examined the patient and discussed plan with Ms. Toberman. Above note has been edited by me to reflect my findings and recommendations. I personally reviewed all CNS imaging. Patient with multifocal embolic infarcts. If above stroke workup is unrevealing for embolic source, TEE and ambulatory cardiac monitor should be considered.   Su Monks, MD [facility] [phone]   If 7pm- 7am, please page neurology on call as listed in [place].

## 2021-04-21 ENCOUNTER — Inpatient Hospital Stay (HOSPITAL_COMMUNITY): Payer: Medicare Other

## 2021-04-21 DIAGNOSIS — I6389 Other cerebral infarction: Secondary | ICD-10-CM

## 2021-04-21 DIAGNOSIS — I6310 Cerebral infarction due to embolism of unspecified precerebral artery: Principal | ICD-10-CM

## 2021-04-21 DIAGNOSIS — Z8673 Personal history of transient ischemic attack (TIA), and cerebral infarction without residual deficits: Secondary | ICD-10-CM

## 2021-04-21 LAB — ECHOCARDIOGRAM COMPLETE
Area-P 1/2: 4.63 cm2
Height: 61 in
P 1/2 time: 568 msec
S' Lateral: 2.8 cm
Weight: 2211.65 oz

## 2021-04-21 LAB — LIPID PANEL
Cholesterol: 178 mg/dL (ref 0–200)
HDL: 89 mg/dL (ref >40)
LDL Cholesterol: 73 mg/dL (ref 0–99)
Total CHOL/HDL Ratio: 2 RATIO
Triglycerides: 79 mg/dL (ref <150)
VLDL: 16 mg/dL (ref 0–40)

## 2021-04-21 LAB — HEMOGLOBIN A1C
Hgb A1c MFr Bld: 5.3 % (ref 4.8–5.6)
Mean Plasma Glucose: 105.41 mg/dL

## 2021-04-21 LAB — TSH: TSH: 1.522 u[IU]/mL (ref 0.350–4.500)

## 2021-04-21 MED ORDER — MELATONIN 3 MG PO TABS
3.0000 mg | ORAL_TABLET | Freq: Every day | ORAL | Status: DC
  Administered 2021-04-21: 3 mg via ORAL
  Filled 2021-04-21: qty 1

## 2021-04-21 MED ORDER — SENNOSIDES-DOCUSATE SODIUM 8.6-50 MG PO TABS
1.0000 | ORAL_TABLET | Freq: Every day | ORAL | Status: DC
Start: 2021-04-21 — End: 2021-04-22
  Administered 2021-04-21: 1 via ORAL
  Filled 2021-04-21: qty 1

## 2021-04-21 MED ORDER — ALPRAZOLAM 0.25 MG PO TABS: 0.2500 mg | ORAL_TABLET | Freq: Two times a day (BID) | ORAL | Status: DC | PRN

## 2021-04-21 MED ORDER — ZOLPIDEM TARTRATE 5 MG PO TABS
5.0000 mg | ORAL_TABLET | Freq: Every evening | ORAL | Status: DC | PRN
  Administered 2021-04-21: 5 mg via ORAL
  Filled 2021-04-21: qty 1

## 2021-04-21 NOTE — Evaluation (Addendum)
Occupational Therapy Evaluation Patient Details Name: HAILEYANN STAIGER MRN: [id number] DOB: June 05, 1933 Today's Date: 04/21/2021   History of Present Illness Patient is 87 y.o. female admitted via EMS due to Lt LE weakness on Saturday and a fall on Monday 11/21 due to difficulty ambulating; pt hit her head during fall. MRI Tuesday morning reveals multiple small acute infarcts within the right greater than left cerebral hemispheres and within the right cerebellum. PMH significant for HTN, HLD, CAD, osteoporosis, hypothryoidism, neuropathy, hx of vertebral fractures and back surgery.   Clinical Impression   Mrs. Hydee Qualls is an 87 year old woman that is typically independent who presents today with above medical history. On evaluation her upper extremity range of motion, strength and coordination are at baseline. She reports normal vision except for some blurriness when she looks at the ceiling. She is able to read functionally. No apparent cognitive deficits. She is min guard to supervision to ambulate with RW to bathroom, stand at sink and ambulate to recliner. Attempts to not use the Bayley resulted in a loss of balance to her left. Patient reports difficulties with left knee and lower extremity prior to stroke and had done outpatient PT up until a month ago. Today her only complaints are with her left knee/leg. Overall patient is min guard/supervision for functional mobility and ADLs only for safety due to new use of [place] and impaired balance. Recommend home with supervision from family but does not require skilled OT services at discharge. Recommend continued PT to address balance and LLE deficits.  Will follow acutely to make sure patient consistently safe with [place] for functional tasks.   Recommendations for follow up therapy are one component of a multi-disciplinary discharge planning process, led by the attending physician.  Recommendations may be updated based on patient status, additional  functional criteria and insurance authorization.   Follow Up Recommendations  No OT follow up    Assistance Recommended at Discharge Frequent or constant Supervision/Assistance  Functional Status Assessment  Patient has had a recent decline in their functional status and demonstrates the ability to make significant improvements in function in a reasonable and predictable amount of time.  Equipment Recommendations  Other (comment) (Needs RW)    Recommendations for Other Services       Precautions / Restrictions Precautions Precautions: Fall Restrictions Weight Bearing Restrictions: No Other Position/Activity Restrictions: WBAT      Mobility Bed Mobility Overal bed mobility: Needs Assistance Bed Mobility: Supine to Sit     Supine to sit: Min assist;HOB elevated     General bed mobility comments: min assist for hand hold to pull herself up in to sitting    Transfers Overall transfer level: Needs assistance Equipment used: Rolling Gomez (2 wheels) Transfers: Sit to/from Stand Sit to Stand: Min guard           General transfer comment: Min guard to ambulate with RW to bathroom and then from recliner to door and back. Had loss of balance to the left without Mathurin      Balance Overall balance assessment: Needs assistance Sitting-balance support: No upper extremity supported;Feet supported Sitting balance-Leahy Scale: Good     Standing balance support: Reliant on assistive device for balance;During functional activity;Bilateral upper extremity supported;Single extremity supported Standing balance-Leahy Scale: Poor Standing balance comment: Mostly reliant on Herrin for ambulation. Able to take hands off of Grandt for ADLs.  ADL either performed or assessed with clinical judgement   ADL Overall ADL's : Needs assistance/impaired Eating/Feeding: Independent   Grooming: Supervision/safety;Standing Grooming Details (indicate cue  type and reason): stood at sink to perform grooming Upper Body Bathing: Set up;Supervision/ safety   Lower Body Bathing: Set up;Supervison/ safety   Upper Body Dressing : Set up;Supervision/safety   Lower Body Dressing: Set up;Supervision/safety Lower Body Dressing Details (indicate cue type and reason): able to don socks, manage LB clothing Toilet Transfer: Min guard;Regular Toilet;Rolling Von[place] (2 wheels)   Toileting- Clothing Manipulation and Hygiene: Supervision/safety;Sit to/from stand       Functional mobility during ADLs: Min guard;Rolling Drumwright (2 wheels)       Vision Patient Visual Report: No change from baseline Additional Comments: reports cataract surgery and lens replacements bilaterally - one for distance and up close. Reports some blurriness when looking at the ceiling but otherwise her normal.     Perception     Praxis      Pertinent Vitals/Pain Pain Assessment: No/denies pain     Hand Dominance Right   Extremity/Trunk Assessment Upper Extremity Assessment Upper Extremity Assessment: RUE deficits/detail;LUE deficits/detail RUE Deficits / Details: WNL ROM, 4-/5 shoulder strength (hx of TSA), bicep 5/5, tricep 4/5, wrist 5/5, grip 4/5 RUE Sensation: WNL RUE Coordination: WNL LUE Deficits / Details: WNL ROM, 4-/5 shoulder strength, bicep 5/5, tricep 4/5, wrist 5/5, grip 4/5 LUE Sensation: WNL LUE Coordination: WNL (normal finger to thumb, finger to finger is slightly slower on left compared to right.)   Lower Extremity Assessment Lower Extremity Assessment: Defer to PT evaluation   Cervical / Trunk Assessment Cervical / Trunk Assessment: Normal;Back Surgery (Reports history of back surgery and LLE weakness.Marland Kitchen)   Communication Communication Communication: No difficulties   Cognition Arousal/Alertness: Awake/alert Behavior During Therapy: WFL for tasks assessed/performed Overall Cognitive Status: Within Functional Limits for tasks assessed                                        General Comments       Exercises     Shoulder Instructions      Home Living Family/patient expects to be discharged to:: Private residence Living Arrangements: Alone Available Help at Discharge: Family Type of Home: House Home Access: Stairs to enter Technical brewer of Steps: [place]: One level     Bathroom Shower/Tub: International aid/development worker Accessibility: No   Home Equipment: Rollator (4 wheels);Shower seat          Prior Functioning/Environment Prior Level of Function : Independent/Modified Independent                        OT Problem List: Impaired balance (sitting and/or standing)      OT Treatment/Interventions: Balance training;Therapeutic activities;Patient/family education;DME and/or AE instruction    OT Goals(Current goals can be found in the care plan section) Acute Rehab OT Goals Patient Stated Goal: to be independent OT Goal Formulation: All assessment and education complete, DC therapy ADL Goals Pt Will Transfer to Toilet: with modified independence;ambulating Pt Will Perform Toileting - Clothing Manipulation and hygiene: with modified independence;sit to/from stand Additional ADL Goal #1: Patient will safely ambulat  in room with Bolduc while perfomring functional tasks.  OT Frequency: Min 1X/week   Barriers to D/C:  Co-evaluation              AM-PAC OT "6 Clicks" Daily Activity     Outcome Measure Help from another person eating meals?: None Help from another person taking care of personal grooming?: A Little Help from another person toileting, which includes using toliet, bedpan, or urinal?: A Little Help from another person bathing (including washing, rinsing, drying)?: A Little Help from another person to put on and taking off regular upper body clothing?: A Little Help from another person to put on and taking off regular lower body clothing?: A  Little 6 Click Score: 19   End of Session Equipment Utilized During Treatment: Rolling Wierzba (2 wheels);Gait belt Nurse Communication: Mobility status  Activity Tolerance: Patient tolerated treatment well Patient left: in chair;with call bell/phone within reach;with chair alarm set  OT Visit Diagnosis: Other abnormalities of gait and mobility (R26.89);Unsteadiness on feet (R26.81)                Time: 3435-6861 OT Time Calculation (min): 32 min Charges:  OT General Charges $OT Visit: 1 Visit OT Evaluation $OT Eval Low Complexity: 1 Low OT Treatments $Self Care/Home Management : 8-22 mins  Faithlyn Recktenwald, OTR/L [place] [phone] Pager: [phone]   Lenward Chancellor 04/21/2021, 12:11 PM

## 2021-04-21 NOTE — Evaluation (Signed)
  SLP Cancellation Note  Patient Details Name: [name] MRN: [id number] DOB: 11/04/1933   Cancelled treatment:       Reason Eval/Treat Not Completed:  (pt currently with echo will continue efforts) Kathleen Lime, MS [place] Office [phone] Pager [phone]   Macario Golds 04/21/2021, 10:40 AM

## 2021-04-21 NOTE — TOC Progression Note (Signed)
Transition of Care [facility]) - Progression Note    Patient Details  Name: [name] MRN: [id number] Date of Birth: 04-Apr-1934  Transition of Care [facility]) CM/SW Contact  Leeroy Cha, RN Phone Number: 04/21/2021, 8:52 AM  Clinical Narrative:    Assessment & Plan:   Principal Problem:   Leg weakness Active Problems:   Essential hypertension   Lumbar stenosis with neurogenic claudication   Confusion   Hypokalemia   #1 acute infarcts in the right and left cerebral hemispheres-patient admitted with left lower extremity weakness and fall prior to admission. Continue aspirin and Lipitor CT of the lumbar spine with post surgical changes of L3-L5, stable residual spinal stenosis. CT cervical spine no acute findings. CT head no acute findings. Hemoglobin A1c TSH and lipid panel pending. LDL 130 10/2020. Discussed with neurology will see the patient at [facility] no need to transfer her to [facility] at this time. Echo CT angiogram of the head and neck ordered.   MRI brain multiple small acute infarcts within the right greater than left cerebral hemisphere and within the right cerebellum measuring up to 14 mm.  Multiple vascular territories involved and suspicious for embolic process.  Mild to moderate chronic small vessel ischemic changes within the cerebral white matter.   #2 history of essential hypertension on Norvasc metoprolol and Maxide, blood pressure 122/76. I have stopped her Norvasc and Maxide restart as needed.   #3 depression on Lexapro   #4 hypokalemia repleted.   #5 hypothyroidism continue Synthroid.   Estimated body mass index is 26.12 kg/m as calculated from the following:   Height as of this encounter: 5\' 1"  (1.549 m).   Weight as of this encounter: 62.7 kg.   DVT prophylaxis: Lovenox  code Status: Full code  Family communication discussed with son over the phone Disposition Plan:  Status is: Inpatient The patient will require care spanning > 2  midnights and should be moved to inpatient because: New stroke further work-up pending   Consultants:  neuro   Procedures: None Antimicrobials: None Subjective: She is resting in bed Extremely hard of hearing anxious to go home Wants to be with the family for Thanksgiving Her speech is clear no dysarthria noted she continues to complain of left thigh pain She has decreased range of motion to left knee has had history of knee surgeries bilateral in the past  TOC PLAN OF CARE: Following for toc needs at [facility]-none present at this time, possible cir versus snf for rehab Following for progression.     Barriers to Discharge: Continued Medical Work up  Expected Discharge Plan and Services                                                 Social Determinants of Health (SDOH) Interventions    Readmission Risk Interventions No flowsheet data found.

## 2021-04-21 NOTE — Progress Notes (Signed)
  Echocardiogram 2D Echocardiogram has been performed.  [name] 04/21/2021, 11:17 AM

## 2021-04-21 NOTE — Progress Notes (Signed)
PROGRESS NOTE    RELDA Garner  WCB:[id number] DOB: September 26, 1933 DOA: 04/19/2021 PCP: Burnard Bunting, MD    Chief Complaint  Patient presents with   Fall   Knee Pain    Brief Narrative:  HPI by Dr. Tonie Griffith  on 04/19/2021 [name] is a 87 y.o. female with medical history significant for HTN, HLD, hypothyroidism, peripheral neuropathy, hx of vertebral fractures who presents for evaluation of leg weakness.  She reports that she went to see her grandsons baseball game Saturday evening and on the way back to her car she felt her left leg was becoming very weak and her son had to help her to the car.  After she arrived home she had worsening left leg weakness and had to drag her leg when she ambulated.  She is always been independent and lives by herself and has not had difficulty with ambulation until this occurred Saturday night  Subjective:  C/o insomnia, wants her Karlyn Agee  She prefers xanax twice a day prn, she reports take this at home for several years, she does not get sleep after taking xanax C/o left leg weakness Reports being constipated, wants stool softener   Assessment & Plan:   Principal Problem:   Leg weakness Active Problems:   Essential hypertension   Lumbar stenosis with neurogenic claudication   Confusion   Hypokalemia   Cerebrovascular accident (CVA) due to embolism of precerebral artery ([place])  Acute CVA: MRI evidence of multiple small acute infarcts within the right greater than left cerebral hemispheres and right cerebellum with involvement of multiple vascular territories concerning for embolic process.  Patient is without known history of A. fib. Presents with left leg weakness A1c 5.3 Ldl 73 Echo pending, may need outpatient cardiac monitoring to rule out afib  Hypertension Continue home medications metoprolol Hold Norvasc and Maxide  Hypothyroidism Continue Synthroid  The patient's BMI is: Body mass index is 26.12 kg/m.Marland Kitchen        Unresulted Labs (From admission, onward)     Start     Ordered   04/22/21 6160  Basic metabolic panel  Tomorrow morning,   R        04/21/21 2217   04/22/21 0500  Magnesium  Tomorrow morning,   R        04/21/21 2217   04/19/21 1515  CBC with Differential/Platelet  Once,   R        04/19/21 1515   04/19/21 1429  CBC WITH DIFFERENTIAL  ONCE - STAT,   STAT        04/19/21 1430              DVT prophylaxis: enoxaparin (LOVENOX) injection 40 mg Start: 04/20/21 2200 SCDs Start: 04/20/21 0023   Code Status:full Family Communication: patient Disposition:   Status is: Inpatient  Dispo: The patient is from: home              Anticipated d/c is to: CIR vs HH              Anticipated d/c date is: likely tomorrow                Consultants:  neurology  Procedures:  none  Antimicrobials:   Anti-infectives (From admission, onward)    None           Objective: Vitals:   04/21/21 0543 04/21/21 1313 04/21/21 1330 04/21/21 2141  BP: (!) 140/96 (!) 123/95 (!) 180/106 (!) 140/94  Pulse: 85 91 90  91  Resp: 14 18  17   Temp: 98.1 F (36.7 C) 99.2 F (37.3 C)  98.6 F (37 C)  TempSrc: Oral Oral  Oral  SpO2: 97% 98%  95%  Weight:      Height:        Intake/Output Summary (Last 24 hours) at 04/21/2021 2217 Last data filed at 04/21/2021 1334 Gross per 24 hour  Intake 3 ml  Output --  Net 3 ml   Filed Weights   04/19/21 1233 04/20/21 0100  Weight: 62.3 kg 62.7 kg    Examination:  General exam: alert, awake, communicative,calm, NAD Respiratory system: Clear to auscultation. Respiratory effort normal. Cardiovascular system:  RRR.  Gastrointestinal system: Abdomen is nondistended, soft and nontender.  Normal bowel sounds heard. Central nervous system: Alert and oriented. Left lower extremity slightly weak but able to lift high against gravity Extremities:  no edema Skin: scattered ecchymosis bilateral anterior shin Psychiatry: Judgement and insight  appear normal. Mood & affect appropriate.     Data Reviewed: I have personally reviewed following labs and imaging studies  CBC: Recent Labs  Lab 04/20/21 0316  WBC 7.5  HGB 13.8  HCT 41.6  MCV 95.6  PLT 831    Basic Metabolic Panel: Recent Labs  Lab 04/19/21 1450 04/20/21 0316  NA 135 133*  K 3.4* 3.5  CL 95* 94*  CO2 29 30  GLUCOSE 105* 164*  BUN 14 15  CREATININE 0.79 0.69  CALCIUM 10.6* 9.6    GFR: Estimated Creatinine Clearance: 43.7 mL/min (by C-G formula based on SCr of 0.69 mg/dL).  Liver Function Tests: No results for input(s): AST, ALT, ALKPHOS, BILITOT, PROT, ALBUMIN in the last 168 hours.  CBG: No results for input(s): GLUCAP in the last 168 hours.   Recent Results (from the past 240 hour(s))  Resp Panel by RT-PCR (Flu A&B, Covid) Nasopharyngeal Swab     Status: None   Collection Time: 04/19/21  2:59 PM   Specimen: Nasopharyngeal Swab; Nasopharyngeal(NP) swabs in vial transport medium  Result Value Ref Range Status   SARS Coronavirus 2 by RT PCR NEGATIVE NEGATIVE Final    Comment: (NOTE) SARS-CoV-2 target nucleic acids are NOT DETECTED.  The SARS-CoV-2 RNA is generally detectable in upper respiratory specimens during the acute phase of infection. The lowest concentration of SARS-CoV-2 viral copies this assay can detect is 138 copies/mL. A negative result does not preclude SARS-Cov-2 infection and should not be used as the sole basis for treatment or other patient management decisions. A negative result may occur with  improper specimen collection/handling, submission of specimen other than nasopharyngeal swab, presence of viral mutation(s) within the areas targeted by this assay, and inadequate number of viral copies(<138 copies/mL). A negative result must be combined with clinical observations, patient history, and epidemiological information. The expected result is Negative.  Fact Sheet for Patients:   EntrepreneurPulse.com.au  Fact Sheet for Healthcare Providers:  IncredibleEmployment.be  This test is no t yet approved or cleared by the [place] FDA and  has been authorized for detection and/or diagnosis of SARS-CoV-2 by FDA under an Emergency Use Authorization (EUA). This EUA will remain  in effect (meaning this test can be used) for the duration of the COVID-19 declaration under Section 564(b)(1) of the Act, 21 U.S.C.section 360bbb-3(b)(1), unless the authorization is terminated  or revoked sooner.       Influenza A by PCR NEGATIVE NEGATIVE Final   Influenza B by PCR NEGATIVE NEGATIVE Final    Comment: (NOTE)  The Xpert Xpress SARS-CoV-2/FLU/RSV plus assay is intended as an aid in the diagnosis of influenza from Nasopharyngeal swab specimens and should not be used as a sole basis for treatment. Nasal washings and aspirates are unacceptable for Xpert Xpress SARS-CoV-2/FLU/RSV testing.  Fact Sheet for Patients: EntrepreneurPulse.com.au  Fact Sheet for Healthcare Providers: IncredibleEmployment.be  This test is not yet approved or cleared by the [place] FDA and has been authorized for detection and/or diagnosis of SARS-CoV-2 by FDA under an Emergency Use Authorization (EUA). This EUA will remain in effect (meaning this test can be used) for the duration of the COVID-19 declaration under Section 564(b)(1) of the Act, 21 U.S.C. section 360bbb-3(b)(1), unless the authorization is terminated or revoked.  Performed at [facility], [address], [place], [place] [address]          Radiology Studies: CT ANGIO HEAD NECK W WO CM  Result Date: 04/20/2021 CLINICAL DATA:  Left leg weakness, infarcts seen on prior MRI EXAM: CT ANGIOGRAPHY HEAD AND NECK TECHNIQUE: Multidetector CT imaging of the head and neck was performed using the standard protocol during bolus  administration of intravenous contrast. Multiplanar CT image reconstructions and MIPs were obtained to evaluate the vascular anatomy. Carotid stenosis measurements (when applicable) are obtained utilizing NASCET criteria, using the distal internal carotid diameter as the denominator. CONTRAST:  34mL OMNIPAQUE IOHEXOL 350 MG/ML SOLN COMPARISON:  Same-day brain MRI, MRA neck 09/23/2008 FINDINGS: CT HEAD FINDINGS Brain: A small focus of hypodensity is seen in the right precentral gyrus corresponding to an evolving infarct seen on the prior brain MRI. The additional small infarcts seen on that MRI are not well assessed on the current study. There is no evidence of new large vessel territorial infarct. There is no acute intracranial hemorrhage or extra-axial fluid collection. Parenchymal volume is within normal limits. The ventricles are normal in size. There is no solid mass lesion.  There is no midline shift. Vascular: See below. Skull: Normal. Negative for fracture or focal lesion. Sinuses and orbits: The paranasal sinuses are clear. Bilateral lens implants are in place. The globes and orbits are otherwise unremarkable. Review of the MIP images confirms the above findings CTA NECK FINDINGS Aortic arch: There is mild calcified atherosclerotic plaque of the aortic arch without significant stenosis. There is no evidence of dissection. The origins of the major vessels are patent. Right carotid system: There is minimal soft and calcified atherosclerotic plaque at the right carotid bulb without hemodynamically significant stenosis or occlusion. There is evidence of acute no dissection or aneurysm. There is irregularity of the high cervical right internal carotid artery with areas of apparent beading, similar to the study from 2010 (15-29). Left carotid system: There is mild calcified atherosclerotic plaque at the left carotid bulb without hemodynamically significant stenosis or occlusion. There is no evidence of acute  dissection or aneurysm. There is significant multifocal irregularity of the high cervical left ICA with beading, similar to the study from 2010 (15-28, 11-209). Vertebral arteries: The vertebral arteries are patent, without hemodynamically significant stenosis, occlusion, dissection, or aneurysm. Skeleton: Interbody spacers are noted at C5-C6 and C6-C7. There is grade 1 anterolisthesis of C4 on C5. There is no visible canal hematoma. There is no acute osseous abnormality or aggressive osseous lesion. There is degenerative change of the temporomandibular joints. Other neck: The soft tissues are unremarkable. Upper chest: The imaged lung apices are clear. Review of the MIP images confirms the above findings CTA HEAD FINDINGS Anterior circulation: The bilateral intracranial  ICAs are patent, with minimal calcified atherosclerotic plaque. The bilateral MCAs are patent. The bilateral ACAs are patent. There is no aneurysm. Posterior circulation: The right V4 segment is diminutive compared to the left, likely an anatomic variant. The V4 segments are patent. The basilar artery is patent. The bilateral PCAs are patent. There is no aneurysm. Venous sinuses: Patent. Anatomic variants: None. Review of the MIP images confirms the above findings IMPRESSION: 1. Small focus of hypodensity in the right precentral gyrus corresponding to the evolving infarct seen on the same-day brain MRI. The additional small infarcts seen on that study are not well seen on the current study. 2. Significant irregularity and beading of the high cervical internal carotid arteries is most suspicious for fibromuscular dysplasia. 3. Mild calcified atherosclerotic plaque of the carotid bulbs without hemodynamically significant stenosis or occlusion. Otherwise, patent vasculature of the head and neck with no significant stenosis or occlusion. Electronically Signed   By: Valetta Mole M.D.   On: 04/20/2021 16:49   MR BRAIN WO CONTRAST  Result Date:  04/20/2021 CLINICAL DATA:  Headache, chronic, no new features. Left lower extremity weakness and falls. Rule out CVA. EXAM: MRI HEAD WITHOUT CONTRAST TECHNIQUE: Multiplanar, multiecho pulse sequences of the brain and surrounding structures were obtained without intravenous contrast. COMPARISON:  Prior head CT examinations 04/19/2021 and earlier. Brain MRI 06/15/2019. FINDINGS: Brain: Mild intermittent motion degradation. There are multiple small acute cortical and subcortical infarcts within the right cerebral hemisphere within the right frontal lobe, right parietal lobe, right temporoparietal junction, right caudate and lentiform nuclei and right insula. These infarcts involve the right ACA, right MCA and watershed territories. The largest infarct is located within the posterior right frontal lobe, measuring 14 mm. Subcentimeter acute infarcts are also present within the left parietal and anterior left temporal lobes (left MCA/PCA watershed territories). Additional subcentimeter acute infarct within the right cerebellum. Background mild-to-moderate patchy and ill-defined hypoattenuation within the cerebral white matter, nonspecific but compatible chronic small vessel ischemic disease. No evidence of an intracranial mass. No chronic intracranial blood products. No extra-axial fluid collection. No midline shift. Vascular: Maintained flow voids within the proximal large arterial vessels. Skull and upper cervical spine: Focal suspicious marrow lesion. Susceptibility artifact arising from cervical spinal fusion hardware. Sinuses/Orbits: Visualized orbits show no acute finding. Redemonstrated chronic fracture deformity of the left orbital floor. No significant paranasal sinus disease. IMPRESSION: Mildly motion degraded exam. Multiple small acute infarcts within the right greater than left cerebral hemispheres and within the right cerebellum, as described and measuring up to 14 mm. There is involvement of multiple  vascular territories, and findings are suspicious for an embolic process. Mild-to-moderate chronic small vessel ischemic changes within the cerebral white matter. Electronically Signed   By: Kellie Simmering D.O.   On: 04/20/2021 11:40   ECHOCARDIOGRAM COMPLETE  Result Date: 04/21/2021    ECHOCARDIOGRAM REPORT   Patient Name:   [name] Date of Exam: 04/21/2021 Medical Rec #:  [id number]      Height:       61.0 in Accession #:    [id number]     Weight:       138.2 lb Date of Birth:  1933-07-22      BSA:          1.614 m Patient Age:    21 years       BP:           140/96 mmHg Patient Gender: F  HR:           91 bpm. Exam Location:  Inpatient Procedure: 2D Echo, Cardiac Doppler and Color Doppler Indications:    Stroke  History:        Patient has no prior history of Echocardiogram examinations.                 CAD; Risk Factors:Hypertension and Dyslipidemia.  Sonographer:    Bernadene Person RDCS Referring Phys: [id number] [place]  1. Left ventricular ejection fraction, by estimation, is 60 to 65%. The left ventricle has normal function. The left ventricle has no regional wall motion abnormalities. Left ventricular diastolic parameters are consistent with Grade I diastolic dysfunction (impaired relaxation).  2. Right ventricular systolic function is normal. The right ventricular size is normal. Tricuspid regurgitation signal is inadequate for assessing PA pressure.  3. The mitral valve is normal in structure. Trivial mitral valve regurgitation. No evidence of mitral stenosis.  4. The aortic valve is tricuspid. Aortic valve regurgitation is trivial. No aortic stenosis is present. FINDINGS  Left Ventricle: Left ventricular ejection fraction, by estimation, is 60 to 65%. The left ventricle has normal function. The left ventricle has no regional wall motion abnormalities. The left ventricular internal cavity size was normal in size. There is  no left ventricular hypertrophy. Left  ventricular diastolic parameters are consistent with Grade I diastolic dysfunction (impaired relaxation). Right Ventricle: The right ventricular size is normal. No increase in right ventricular wall thickness. Right ventricular systolic function is normal. Tricuspid regurgitation signal is inadequate for assessing PA pressure. Left Atrium: Left atrial size was normal in size. Right Atrium: Right atrial size was normal in size. Pericardium: There is no evidence of pericardial effusion. Mitral Valve: The mitral valve is normal in structure. Trivial mitral valve regurgitation. No evidence of mitral valve stenosis. Tricuspid Valve: The tricuspid valve is normal in structure. Tricuspid valve regurgitation is trivial. Aortic Valve: The aortic valve is tricuspid. Aortic valve regurgitation is trivial. Aortic regurgitation PHT measures 568 msec. No aortic stenosis is present. Pulmonic Valve: The pulmonic valve was not well visualized. Pulmonic valve regurgitation is not visualized. Aorta: The aortic root and ascending aorta are structurally normal, with no evidence of dilitation. IAS/Shunts: The interatrial septum was not well visualized.  LEFT VENTRICLE PLAX 2D LVIDd:         3.90 cm   Diastology LVIDs:         2.80 cm   LV e' medial:    4.50 cm/s LV PW:         0.80 cm   LV E/e' medial:  13.2 LV IVS:        0.90 cm   LV e' lateral:   8.81 cm/s LVOT diam:     2.00 cm   LV E/e' lateral: 6.8 LV SV:         39 LV SV Index:   24 LVOT Area:     3.14 cm  RIGHT VENTRICLE RV S prime:     16.00 cm/s TAPSE (M-mode): 2.3 cm LEFT ATRIUM             Index        RIGHT ATRIUM           Index LA diam:        2.30 cm 1.42 cm/m   RA Area:     13.10 cm LA Vol (A2C):   35.0 ml 21.68 ml/m  RA Volume:   28.70 ml  17.78 ml/m LA Vol (A4C):   30.8 ml 19.08 ml/m LA Biplane Vol: 33.7 ml 20.88 ml/m  AORTIC VALVE LVOT Vmax:   80.10 cm/s LVOT Vmean:  50.600 cm/s LVOT VTI:    0.125 m AI PHT:      568 msec  AORTA Ao Root diam: 2.90 cm Ao Asc  diam:  3.10 cm MITRAL VALVE MV Area (PHT): 4.63 cm    SHUNTS MV Decel Time: 164 msec    Systemic VTI:  0.12 m MV E velocity: 59.60 cm/s  Systemic Diam: 2.00 cm MV A velocity: 96.00 cm/s MV E/A ratio:  0.62 Oswaldo Milian MD Electronically signed by Oswaldo Milian MD Signature Date/Time: 04/21/2021/3:15:45 PM    Final    DG HIP UNILAT WITH PELVIS 2-3 VIEWS LEFT  Result Date: 04/20/2021 CLINICAL DATA:  Bilateral hip pain.  Fall. EXAM: DG HIP (WITH OR WITHOUT PELVIS) 2-3V LEFT COMPARISON:  None. FINDINGS: Degenerative changes in the hips with joint space narrowing and spurring. Postoperative changes in the lower lumbar spine. No acute bony abnormality. Specifically, no fracture, subluxation, or dislocation. IMPRESSION: No acute bony abnormality. Electronically Signed   By: Rolm Baptise M.D.   On: 04/20/2021 10:56   DG HIP UNILAT WITH PELVIS 2-3 VIEWS RIGHT  Result Date: 04/20/2021 CLINICAL DATA:  Acute bilateral hip pain after fall several days ago. EXAM: DG HIP (WITH OR WITHOUT PELVIS) 2-3V RIGHT COMPARISON:  None. FINDINGS: There is no evidence of hip fracture or dislocation. Mild narrowing and osteophyte formation is seen involving the right hip. 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 No acute abnormality seen. Electronically Signed   By: Marijo Conception M.D.   On: 04/20/2021 10:40        Scheduled Meds:  aspirin EC  81 mg Oral q morning   atorvastatin  40 mg Oral Daily   clopidogrel  75 mg Oral Daily   enoxaparin (LOVENOX) injection  40 mg Subcutaneous Q24H   escitalopram  10 mg Oral QHS   levothyroxine  25 mcg Oral Q0600   melatonin  3 mg Oral QHS   metoprolol succinate  25 mg Oral q morning   pantoprazole  40 mg Oral Daily   senna-docusate  1 tablet Oral QHS   sodium chloride flush  3 mL Intravenous Q12H   Continuous Infusions:  sodium chloride       LOS: 1 day   Time spent: 4mins, case discussed with neurology through secure chat Greater  than 50% of this time was spent in counseling, explanation of diagnosis, planning of further management, and coordination of care.   Voice Recognition Viviann Spare dictation system was used to create this note, attempts have been made to correct errors. Please contact the author with questions and/or clarifications.   Florencia Reasons, MD PhD FACP [facility]  Available via Epic secure chat 7am-7pm for nonurgent issues Please page for urgent issues To page the attending provider between 7A-7P or the covering provider during after hours 7P-7A, please log into the web site [email or web address] and access using universal  password for that web site. If you do not have the password, please call the hospital operator.    04/21/2021, 10:17 PM

## 2021-04-21 NOTE — Plan of Care (Signed)
Neurology plan of care  CTA H&N revealed irregularity and beading of the high cervical ICAs suggestive of fibromuscular dysplasia. No hemodynamically-significant stenosis of the head and neck. No aneurysms identified. This does not change post-stroke management.  The presence of ICA findings consistent with FMD may also suggest renal FMD, but patient has no e/o resistant HTN therefore this does not need to be further evaluated at this time.   TTE results are pending. If no intracardiac clot is identified, we would not recommend TEE at this time given patient's age. She may be discharged on ASA 81mg  daily + plavix 75mg  daily x21 days f/b ASA 81mg  daily after that. If no intracardiac clot is identified on TTE she should be arranged to have an ambulatory cardiac monitor with cardiology to evaluate for occult a fib.  We will f/u on her TTE. If there is no intracardiac clot identified she does not need to be seen by neurology again prior to hospital discharge.  I will arrange outpatient f/u with neurology.  Su Monks, MD [facility] [phone]  If 7pm- 7am, please page neurology on call as listed in [place].

## 2021-04-21 NOTE — Progress Notes (Signed)
Inpatient Rehab Admissions Coordinator:   CIR consult was placed prior to OT documentation being submitted, OT is now recommending no follow up. Without OT needs, I do not think Pt.requires the intensity of CIR. I will not pursue for CIR admission.   Clemens Catholic, Carlos, [place] Admissions Coordinator  [phone] ([place]) [phone] (office)

## 2021-04-21 NOTE — Progress Notes (Signed)
Inpatient Rehab Admissions Coordinator:   Per therapy recommendations pt was screened for CIR appropriateness by Shann Medal, PT, DPT.  At this time we are recommending a CIR consult and I will place an order per our protocol for full assessment.  Note that beds on CIR are extremely limited for the remainder of the week, and we may not have any openings until mid-next week.  If pt is open to considering other area AIRs ([facility], [facility], [facility]), please consult TOC so that they can make a referral.    Shann Medal, PT, DPT Admissions Coordinator [phone] 04/21/21  11:26 AM

## 2021-04-21 NOTE — Evaluation (Signed)
Clinical/Bedside Swallow Evaluation Patient Details  Name: [name] MRN: [id number] Date of Birth: 1933-08-10  Today's Date: 04/21/2021 Time: SLP Start Time (ACUTE ONLY): 26 SLP Stop Time (ACUTE ONLY): 1634 SLP Time Calculation (min) (ACUTE ONLY): 33 min  Past Medical History:  Past Medical History:  Diagnosis Date   Anxiety    Arthritis    Chronic headaches    Colon polyps    Complication of anesthesia    Elevated blood pressure after having last Kyphoplasty; pt stated "I was given Morphine and had to stay overnight"   Coronary artery disease    DI (detrusor instability)    Fracture of vertebra    x 3   GERD (gastroesophageal reflux disease)    History of hiatal hernia    HLD (hyperlipidemia)    Hypertension    Hypothyroidism    Menopausal symptoms    MGUS (monoclonal gammopathy of unknown significance) 12/27/2017   IgA   Osteoporosis    Peripheral neuropathy 12/27/2017   Peripheral neuropathy    Pneumonia    Restless leg syndrome    Varicose veins    Past Surgical History:  Past Surgical History:  Procedure Laterality Date   ABDOMINAL HYSTERECTOMY  1992   TAH,BSO   BLADDER SUSPENSION  2011   CRYOMESH   CARPAL TUNNEL RELEASE Bilateral 1982   CHOLECYSTECTOMY  1992   COLONOSCOPY     EYE SURGERY Bilateral    Cataract removal   KYPHOPLASTY     X 3   LUMBAR LAMINECTOMY WITH COFLEX 1 LEVEL N/A 12/29/2015   Procedure: L3-4 Laminectomy with Coflex;  Surgeon: Kristeen Miss, MD;  Location: [facility];  Service: Neurosurgery;  Laterality: N/A;  L3-4 Laminectomy with coflex   OOPHORECTOMY     BSO   REPLACEMENT TOTAL KNEE Bilateral 2008,  2011   RIGHT 2008, LEFT 2011   REVERSE SHOULDER ARTHROPLASTY Right 01/30/2020   Procedure: REVERSE SHOULDER ARTHROPLASTY;  Surgeon: Nicholes Stairs, MD;  Location: [facility];  Service: Orthopedics;  Laterality: Right;  2.5 hrs   SPINAL FUSION  2011   VERTEBRAL SURGERY     T-8, T-10. 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  Pt also with h/o ACDF C5-C7 - after which she denies pharyngeal dysphagia.  UGI done in 2015 showing hiatal hernia, gastroesophageal reflux, mild tertiary contractions.  Swallow evaluation was ordered.    Assessment / Plan / Recommendation  Clinical Impression  Patient presents with functional oropharyngeal swallow - based on clinical swallow evaluation.  No focal CN deficits present - she does have mild right upper labial asymmetry - reports due to a dog bite years ago.  Pt passed 3 ounce Yale water challenge without any deficits.  Adequate mastication and oral clearance of all boluses noted.   SLP educated pt to compensation strategies re: UGI results showing tertiary contractions, hiatal hernia and GER in 2015.  Provided pt information in writing using teach back for confirmation of strategies.  NO SLP follow up indicated - pt may benefit from follow up cognitive linguistic evaluation given she resides alone PTA. SLP Visit Diagnosis: Dysphagia, unspecified (R13.10)    Aspiration Risk  Mild aspiration risk    Diet Recommendation Regular;Thin liquid   Liquid Administration via: Straw;Cup Medication Administration: Whole meds with liquid Supervision: Patient able to self feed Compensations: Slow rate;Small sips/bites Postural Changes: Seated upright  at 90 degrees;Remain upright for at least 30 minutes after po intake    Other  Recommendations Oral Care Recommendations: Oral care BID    Recommendations for follow up therapy are one component of a multi-disciplinary discharge planning process, led by the attending physician.  Recommendations may be updated based on patient status, additional functional criteria and insurance authorization.  Follow up Recommendations No SLP follow up - CIR SlP for cognitive linguistic evaluation given pt resides alone     Assistance Recommended  at Discharge  N/a  Functional Status Assessment  N/a  Frequency and Duration   N/a         Prognosis   N/a     Swallow Study   General Date of Onset: 04/21/21 HPI: 87 yo female adm to [facility] with leg weakness, Brain imaging showed a right precentral gyrus hypodensity - showing evolving infarct.  Pt also with h/o ACDF C5-C7 - after which she denies pharyngeal dysphagia.  UGI done in 2015 showing hiatal hernia, gastroesophageal reflux, mild tertiary contractions.  Swallow evaluation was ordered. Type of Study: Bedside Swallow Evaluation Diet Prior to this Study: Regular;Thin liquids Temperature Spikes Noted: No Respiratory Status: Room air History of Recent Intubation: No Behavior/Cognition: Alert;Cooperative;Pleasant mood Oral Cavity Assessment: Within Functional Limits Oral Care Completed by SLP: No Oral Cavity - Dentition: Adequate natural dentition Vision: Functional for self-feeding Self-Feeding Abilities: Able to feed self Patient Positioning: Upright in chair Baseline Vocal Quality: Normal Volitional Cough: Strong Volitional Swallow: Able to elicit    Oral/Motor/Sensory Function Overall Oral Motor/Sensory Function: Within functional limits   Ice Chips Ice chips: Not tested   Thin Liquid Thin Liquid: Within functional limits    Nectar Thick Nectar Thick Liquid: Not tested   Honey Thick Honey Thick Liquid: Not tested   Puree Puree: Within functional limits Presentation: Self Fed;Spoon   Solid     Solid: Within functional limits Presentation: Self Fredirick Lathe 04/21/2021,4:54 PM   Kathleen Lime, MS [facility] SLP [place] Office [phone] Pager [phone]

## 2021-04-22 LAB — BASIC METABOLIC PANEL
Anion gap: 10 (ref 5–15)
BUN: 19 mg/dL (ref 8–23)
CO2: 28 mmol/L (ref 22–32)
Calcium: 9.2 mg/dL (ref 8.9–10.3)
Chloride: 93 mmol/L — ABNORMAL LOW (ref 98–111)
Creatinine, Ser: 0.73 mg/dL (ref 0.44–1.00)
GFR, Estimated: >60 mL/min (ref >60)
Glucose, Bld: 100 mg/dL — ABNORMAL HIGH (ref 70–99)
Potassium: 3.5 mmol/L (ref 3.5–5.1)
Sodium: 131 mmol/L — ABNORMAL LOW (ref 135–145)

## 2021-04-22 LAB — MAGNESIUM: Magnesium: 2.2 mg/dL (ref 1.7–2.4)

## 2021-04-22 MED ORDER — TRIAMTERENE-HCTZ 37.5-25 MG PO TABS: 1.0000 | ORAL_TABLET | Freq: Every morning | ORAL | Status: DC

## 2021-04-22 MED ORDER — CLOPIDOGREL BISULFATE 75 MG PO TABS: 75.0000 mg | ORAL_TABLET | Freq: Every day | ORAL | 3 refills | Status: DC

## 2021-04-22 MED ORDER — ASPIRIN EC 81 MG PO TBEC
81.0000 mg | DELAYED_RELEASE_TABLET | Freq: Every morning | ORAL | 0 refills | Status: AC
Start: 2021-04-22 — End: 2021-05-13

## 2021-04-22 MED ORDER — ROSUVASTATIN CALCIUM 20 MG PO TABS: 20.0000 mg | ORAL_TABLET | Freq: Every day | ORAL | 0 refills | Status: AC

## 2021-04-22 NOTE — Discharge Summary (Signed)
Discharge Summary  PATT STEINHARDT BTD:[id number] DOB: [birth year]  PCP: Burnard Bunting, MD  Admit date: 04/19/2021 Discharge date: 04/22/2021  Time spent: 51mins, more than 50% time spent on coordination of care.   Recommendations for Outpatient Follow-up:  F/u with PCP within a week  for hospital discharge follow up, repeat cbc/bmp at follow up F/u with neurology F/u with cardiology for outpatient heart monitor to rule out Afib Home health arranged   New meds: plavix Increase crestor  Hold maxzide Discontinue NSAIDs    Discharge Diagnoses:  Active Hospital Problems   Diagnosis Date Noted   Leg weakness 04/19/2021   Cerebrovascular accident (CVA) due to embolism of precerebral artery (HCC)    Hypokalemia 04/19/2021   Confusion 05/20/2019   Lumbar stenosis with neurogenic claudication 12/29/2015   Essential hypertension 03/07/2014    Resolved Hospital Problems  No resolved problems to display.    Discharge Condition: stable  Diet recommendation: heart healthy  Filed Weights   04/19/21 1233 04/20/21 0100  Weight: 62.3 kg 62.7 kg    History of present illness: (Per admitting MD Dr. Tonie Griffith) Chief Complaint: Leg weakness   HPI: ANGELO CAROLL is a 87 y.o. female with medical history significant for HTN, HLD, hypothyroidism, peripheral neuropathy, hx of vertebral fractures who presents for evaluation of leg weakness.  She reports that she went to see her grandsons baseball game Saturday evening and on the way back to her car she felt her left leg was becoming very weak and her son had to help her to the car.  After she arrived home she had worsening left leg weakness and had to drag her leg when she ambulated.  She is always been independent and lives by herself and has not had difficulty with ambulation until this occurred Saturday night.  She does report having some lower back pain that is chronic and she is followed by neurosurgery.  She does have history of spinal  stenosis in her lumbar spine.  She denies having any injury or trauma to her back.  She denies any numbness of her pelvic or thigh region.  She denies any difficulty with urination or bowel movements.  She has not had any fevers or chills.  She did fall yesterday hitting her head but had no loss of consciousness.  She is able to provide history and answer questions appropriately.  She does state that 2 days ago her son and grandson those she seemed little confused but she feels like she is back to her baseline now.  She had an MRI with contrast of her lumbar spine show any acute changes.  This was discussed with neurosurgery who did not feel there was any need for neurosurgical intervention at this time.  MRI of the brain was ordered to make sure there is no stroke as the etiology of her symptoms but MRI of the brain cannot be obtained until the morning secondary to receiving IV contrast this afternoon Denies tobacco alcohol or illicit drug use   ED Course: Ms. Noreen is been hemodynamically stable while in the emergency room and has no complaints at this time other than weakness in her left leg.  She was ambulated in the emergency room and requires 2 person assist to ambulate which is not normal for her.  Lab work reveals sodium 135 potassium 3.4 chloride 95 bicarb 29 creatinine 0.79 BUN 14 glucose 105.  Hospitalist service asked to evaluate and manage patient overnight  Hospital Course:  Principal Problem:  Leg weakness Active Problems:   Essential hypertension   Lumbar stenosis with neurogenic claudication   Confusion   Hypokalemia   Cerebrovascular accident (CVA) due to embolism of precerebral artery (HCC)  Acute JAS:[id number] with left leg weakness -MRI evidence of multiple small acute infarcts within the right greater than left cerebral hemispheres and right cerebellum with involvement of multiple vascular territories concerning for embolic process.  Patient is without known history of A.  fib. -A1c 5.3 -LDL 73 -Echo no cardiac embolic source identified,need  outpatient cardiac monitoring to rule out afib, message sent to cards master through epic inbasket -patient is seen by neurology who recommends asa/plavix three weeks, then plavix alone, recommend outpatient cardiac monitoring to rule out A. fib, -Patient reports history of Lipitor intolerance due to joint pain, but appears able to tolerate Crestor twice a week prior to admission, Crestor increased to daily, discussed with neurology who is in agreement, follow-up with PCP and neurology   Hypertension Continue home medications metoprolol Norvasc and Maxide held in the hospital to allow permissive hypertension in the setting of  acute CVA She is discharged on metoprolol and Norvasc Continue hold Maxzide, she has poor oral intake, there is no edema, she has borderline low sodium, she is advised to follow-up with PCP to decide on resumption or discontinue Maxzide She was evaluated by physical therapy  with inpatient rehab recommendation initially, however on repeat evaluation patient appears improved , therapist changed recommendation to home health, patient has great family support, she is in agreement to go home with home health, case manger to set up home health  Mild hyponatremia Appear dehydrated with poor oral intake Hold Maxzide Encourage oral intake Follow-up with PCP   Hypothyroidism TSH 1.5 Continue Synthroid   The patient's BMI is: Body mass index is 26.12 kg/m.Marland Kitchen  Procedures: none  Consultations: Neurology  Discharge Exam: BP (!) 158/97 (BP Location: Left Arm)   Pulse 87   Temp 98.4 F (36.9 C) (Oral)   Resp 16   Ht 5\' 1"  (1.549 m)   Wt 62.7 kg   SpO2 95%   BMI 26.12 kg/m   General: NAD, AAOx3 Cardiovascular: RRR Respiratory: Normal respiratory effort  Discharge Instructions You were cared for by a hospitalist during your hospital stay. If you have any questions about your discharge  medications or the care you received while you were in the hospital after you are discharged, you can call the unit and asked to speak with the hospitalist on call if the hospitalist that took care of you is not available. Once you are discharged, your primary care physician will handle any further medical issues. Please note that NO REFILLS for any discharge medications will be authorized once you are discharged, as it is imperative that you return to your primary care physician (or establish a relationship with a primary care physician if you do not have one) for your aftercare needs so that they can reassess your need for medications and monitor your lab values.  Discharge Instructions     Ambulatory referral to Neurology   Complete by: As directed    An appointment is requested in approximately: 4-6 wks   Diet - low sodium heart healthy   Complete by: As directed    Increase activity slowly   Complete by: As directed       Allergies as of 04/22/2021   No Known Allergies      Medication List     STOP taking these medications  celecoxib 200 MG capsule Commonly known as: CELEBREX       TAKE these medications    ALPRAZolam 0.5 MG tablet Commonly known as: XANAX Take 0.25 mg by mouth at bedtime as needed (restless legs).   amLODipine 5 MG tablet Commonly known as: NORVASC Take 1 tablet (5 mg total) by mouth daily. What changed:  how much to take when to take this   amoxicillin 500 MG capsule Commonly known as: AMOXIL Take 2,000 mg by mouth See admin instructions. Take 4 capsules (2000 mg) by mouth one hour prior to dental appointments   aspirin EC 81 MG tablet Take 1 tablet (81 mg total) by mouth every morning for 21 days. Swallow whole.   Benefiber Powd Take 1 daily dose   CALCIUM-D PO Take 1 tablet by mouth daily.   clopidogrel 75 MG tablet Commonly known as: PLAVIX Take 1 tablet (75 mg total) by mouth daily. Start taking on: April 23, 2021    dexlansoprazole 60 MG capsule Commonly known as: DEXILANT Take 60 mg by mouth every morning.   escitalopram 10 MG tablet Commonly known as: LEXAPRO Take 10 mg by mouth at bedtime.   levothyroxine 25 MCG tablet Commonly known as: SYNTHROID Take 25 mcg by mouth daily before breakfast.   metoprolol succinate 25 MG 24 hr tablet Commonly known as: TOPROL-XL Take 25 mg by mouth every morning.   multivitamin with minerals Tabs tablet Take 1 tablet by mouth daily.   NONFORMULARY OR COMPOUNDED ITEM Apply 1 application topically See admin instructions. Transdermal therapeutics  meloxicam 0.5%, doxepin 3%, amantadine 3%, dextromethorphan 2%, lidocaine 2%. - compounded at [facility]:  Apply topically to legs daily after showering for neuropathy   REFRESH OP Place 1 drop into both eyes daily as needed (dry eyes).   rosuvastatin 20 MG tablet Commonly known as: CRESTOR Take 1 tablet (20 mg total) by mouth daily. What changed: when to take this   triamterene-hydrochlorothiazide 37.5-25 MG tablet Commonly known as: MAXZIDE-25 Take 1 tablet by mouth every morning. Hold this medication for now, please see your pcp for hospital discharge follow up, repeat sodium level, please discussion resumption or discontinuation this meds What changed: additional instructions   VITAMIN C PO Take 1 tablet by mouth daily.   Vitamin D (Ergocalciferol) 1.25 MG (50000 UNIT) Caps capsule Commonly known as: DRISDOL Take 50,000 Units by mouth every Friday.   VITAMIN E PO Take 1 capsule by mouth daily.   zolpidem 12.5 MG CR tablet Commonly known as: AMBIEN CR Take 12.5 mg by mouth at bedtime.       No Known Allergies  Follow-up Information     Burnard Bunting, MD Follow up in 1 week(s).   Specialty: Internal Medicine Why: hospital discharge follow up in 1-2 weeks repeat cbc/bmp Contact information: [place] [address] [phone]         Belva Crome, MD .    Specialty: Cardiology Why: please call cardiology office if you do not hear from them in three business days regarding heart monitor Contact information: [address] [place] [address] [place], [place] Follow up.   Why: [facility] [place] tel#[phone] physical therapy/nursing Contact information: [address] [place] [address] [address] [phone]                  The results of significant diagnostics from this  hospitalization (including imaging, microbiology, ancillary and laboratory) are listed below for reference.    Significant Diagnostic Studies: CT ANGIO HEAD NECK W WO CM  Result Date: 04/20/2021 CLINICAL DATA:  Left leg weakness, infarcts seen on prior MRI EXAM: CT ANGIOGRAPHY HEAD AND NECK TECHNIQUE: Multidetector CT imaging of the head and neck was performed using the standard protocol during bolus administration of intravenous contrast. Multiplanar CT image reconstructions and MIPs were obtained to evaluate the vascular anatomy. Carotid stenosis measurements (when applicable) are obtained utilizing NASCET criteria, using the distal internal carotid diameter as the denominator. CONTRAST:  3mL OMNIPAQUE IOHEXOL 350 MG/ML SOLN COMPARISON:  Same-day brain MRI, MRA neck 09/23/2008 FINDINGS: CT HEAD FINDINGS Brain: A small focus of hypodensity is seen in the right precentral gyrus corresponding to an evolving infarct seen on the prior brain MRI. The additional small infarcts seen on that MRI are not well assessed on the current study. There is no evidence of new large vessel territorial infarct. There is no acute intracranial hemorrhage or extra-axial fluid collection. Parenchymal volume is within normal limits. The ventricles are normal in size. There is no solid mass lesion.  There is no midline shift. Vascular: See below. Skull: Normal. Negative for fracture or focal lesion. Sinuses and  orbits: The paranasal sinuses are clear. Bilateral lens implants are in place. The globes and orbits are otherwise unremarkable. Review of the MIP images confirms the above findings CTA NECK FINDINGS Aortic arch: There is mild calcified atherosclerotic plaque of the aortic arch without significant stenosis. There is no evidence of dissection. The origins of the major vessels are patent. Right carotid system: There is minimal soft and calcified atherosclerotic plaque at the right carotid bulb without hemodynamically significant stenosis or occlusion. There is evidence of acute no dissection or aneurysm. There is irregularity of the high cervical right internal carotid artery with areas of apparent beading, similar to the study from 2010 (15-29). Left carotid system: There is mild calcified atherosclerotic plaque at the left carotid bulb without hemodynamically significant stenosis or occlusion. There is no evidence of acute dissection or aneurysm. There is significant multifocal irregularity of the high cervical left ICA with beading, similar to the study from 2010 (15-28, 11-209). Vertebral arteries: The vertebral arteries are patent, without hemodynamically significant stenosis, occlusion, dissection, or aneurysm. Skeleton: Interbody spacers are noted at C5-C6 and C6-C7. There is grade 1 anterolisthesis of C4 on C5. There is no visible canal hematoma. There is no acute osseous abnormality or aggressive osseous lesion. There is degenerative change of the temporomandibular joints. Other neck: The soft tissues are unremarkable. Upper chest: The imaged lung apices are clear. Review of the MIP images confirms the above findings CTA HEAD FINDINGS Anterior circulation: The bilateral intracranial ICAs are patent, with minimal calcified atherosclerotic plaque. The bilateral MCAs are patent. The bilateral ACAs are patent. There is no aneurysm. Posterior circulation: The right V4 segment is diminutive compared to the left,  likely an anatomic variant. The V4 segments are patent. The basilar artery is patent. The bilateral PCAs are patent. There is no aneurysm. Venous sinuses: Patent. Anatomic variants: None. Review of the MIP images confirms the above findings IMPRESSION: 1. Small focus of hypodensity in the right precentral gyrus corresponding to the evolving infarct seen on the same-day brain MRI. The additional small infarcts seen on that study are not well seen on the current study. 2. Significant irregularity and beading of the high cervical internal carotid arteries is most suspicious for fibromuscular dysplasia. 3.  Mild calcified atherosclerotic plaque of the carotid bulbs without hemodynamically significant stenosis or occlusion. Otherwise, patent vasculature of the head and neck with no significant stenosis or occlusion. Electronically Signed   By: Valetta Mole M.D.   On: 04/20/2021 16:49   CT HEAD WO CONTRAST  Result Date: 04/19/2021 CLINICAL DATA:  Head trauma reported in a 87 year old female. EXAM: CT HEAD WITHOUT CONTRAST TECHNIQUE: Contiguous axial images were obtained from the base of the skull through the vertex without intravenous contrast. COMPARISON:  CT of the head from June of 2020. Also MRI of the brain from June 15, 2019 FINDINGS: Brain: No evidence of acute infarction, hemorrhage, hydrocephalus, extra-axial collection or mass lesion/mass effect. Vascular: No hyperdense vessel or unexpected calcification. Skull: Normal. Negative for fracture or focal lesion. Sinuses/Orbits: Visualized paranasal sinuses and orbits are unremarkable. Signs of prior LEFT orbital fracture are redemonstrated. Other: Potential small scalp contusion in the soft tissues overlying the RIGHT parietal region near the vertex. IMPRESSION: No acute intracranial abnormality. Evidence of previous LEFT orbital floor fracture without change. Potential scalp hematoma or small contusion in the high RIGHT parietal scalp. Electronically Signed    By: Zetta Bills M.D.   On: 04/19/2021 15:24   CT CERVICAL SPINE WO CONTRAST  Result Date: 04/19/2021 CLINICAL DATA:  Neck trauma. Fall earlier today with head injury. Previous cervical fusion. EXAM: CT CERVICAL SPINE WITHOUT CONTRAST TECHNIQUE: Multidetector CT imaging of the cervical spine was performed without intravenous contrast. Multiplanar CT image reconstructions were also generated. COMPARISON:  Cervical MRI 01/14/2019. Radiographs 11/02/2016. CT 08/15/2009. FINDINGS: Alignment: Near anatomic. There is a slight anterolisthesis at C4-5 which appears stable. Skull base and vertebrae: No evidence of acute fracture or traumatic subluxation. Previous anterior discectomy and disc replacement at C5-6 and C6-7. Soft tissues and spinal canal: No prevertebral fluid or swelling. No visible canal hematoma. There is synovial calcification around the odontoid process without osseous erosion. Disc levels: Stable mild multilevel spondylosis with disc bulging and uncinate spurring. No large disc herniation or high-grade spinal stenosis. Upper chest: Mild biapical scarring. Other: Bilateral carotid atherosclerosis. IMPRESSION: 1. No evidence of acute cervical spine fracture or traumatic subluxation. 2. Stable degenerative and postsurgical changes as described. No high-grade spinal stenosis identified. Electronically Signed   By: Richardean Sale M.D.   On: 04/19/2021 15:33   CT Lumbar Spine Wo Contrast  Result Date: 04/19/2021 CLINICAL DATA:  Low back pain for more than 6 weeks. Recent fall with leg weakness and recent spinal injection. EXAM: CT LUMBAR SPINE WITHOUT CONTRAST TECHNIQUE: Multidetector CT imaging of the lumbar spine was performed without intravenous contrast administration. Multiplanar CT image reconstructions were also generated. COMPARISON:  Abdominopelvic CT 04/01/2021.  Lumbar MRI 03/10/2021. FINDINGS: Segmentation: There are 5 lumbar type vertebral bodies. Alignment: Stable degenerative  anterolisthesis at L3-4. Otherwise normal. Vertebrae: No evidence of acute lumbar spine fracture or traumatic subluxation. Stable T11 fracture status post spinal augmentation. Patient is status post L4-5 PLIF with intact hardware. Inter spinous fusion has been performed at L3-4. Paraspinal and other soft tissues: No significant paraspinal findings. Aortic and branch vessel atherosclerosis noted. Disc levels: T12-L1: The disc appears normal. Mild bilateral facet hypertrophy. No spinal stenosis. L1-2: Loss of disc height with vacuum phenomenon, annular disc bulging and bilateral facet hypertrophy. Stable borderline spinal stenosis with mild right lateral recess and right foraminal narrowing. L2-3: Loss of disc height with annular disc bulging, endplate osteophytes and bilateral facet hypertrophy. Stable mild spinal stenosis with mild lateral recess and foraminal narrowing  on the left. L3-4: Evaluation mildly limited by beam hardening artifact from the previous spinal instrumentation. Chronic adjacent segment disease with loss of disc height, annular disc bulging and endplate osteophytes. Previous posterior decompression and inter spinous fusion. There is stable mild spinal stenosis with asymmetric left lateral recess and left foraminal narrowing. L4-5: Stable appearance status post laminectomy and PLIF. No spinal stenosis or nerve root encroachment. L5-S1: Advanced asymmetric facet hypertrophy on the right. Disc height is maintained. No spinal stenosis or nerve root encroachment. IMPRESSION: 1. No evidence of acute lumbar spine fracture or traumatic subluxation. 2. Previous T11 spinal augmentation. 3. Stable postsurgical changes at L3-4 and L4-5. Stable residual spinal stenosis as detailed above. Electronically Signed   By: Richardean Sale M.D.   On: 04/19/2021 15:25   MR BRAIN WO CONTRAST  Result Date: 04/20/2021 CLINICAL DATA:  Headache, chronic, no new features. Left lower extremity weakness and falls. Rule  out CVA. EXAM: MRI HEAD WITHOUT CONTRAST TECHNIQUE: Multiplanar, multiecho pulse sequences of the brain and surrounding structures were obtained without intravenous contrast. COMPARISON:  Prior head CT examinations 04/19/2021 and earlier. Brain MRI 06/15/2019. FINDINGS: Brain: Mild intermittent motion degradation. There are multiple small acute cortical and subcortical infarcts within the right cerebral hemisphere within the right frontal lobe, right parietal lobe, right temporoparietal junction, right caudate and lentiform nuclei and right insula. These infarcts involve the right ACA, right MCA and watershed territories. The largest infarct is located within the posterior right frontal lobe, measuring 14 mm. Subcentimeter acute infarcts are also present within the left parietal and anterior left temporal lobes (left MCA/PCA watershed territories). Additional subcentimeter acute infarct within the right cerebellum. Background mild-to-moderate patchy and ill-defined hypoattenuation within the cerebral white matter, nonspecific but compatible chronic small vessel ischemic disease. No evidence of an intracranial mass. No chronic intracranial blood products. No extra-axial fluid collection. No midline shift. Vascular: Maintained flow voids within the proximal large arterial vessels. Skull and upper cervical spine: Focal suspicious marrow lesion. Susceptibility artifact arising from cervical spinal fusion hardware. Sinuses/Orbits: Visualized orbits show no acute finding. Redemonstrated chronic fracture deformity of the left orbital floor. No significant paranasal sinus disease. IMPRESSION: Mildly motion degraded exam. Multiple small acute infarcts within the right greater than left cerebral hemispheres and within the right cerebellum, as described and measuring up to 14 mm. There is involvement of multiple vascular territories, and findings are suspicious for an embolic process. Mild-to-moderate chronic small vessel  ischemic changes within the cerebral white matter. Electronically Signed   By: Kellie Simmering D.O.   On: 04/20/2021 11:40   MR Lumbar Spine W Wo Contrast  Result Date: 04/19/2021 CLINICAL DATA:  Lumbar radiculopathy, prior surgery EXAM: MRI LUMBAR SPINE WITHOUT AND WITH CONTRAST TECHNIQUE: Multiplanar and multiecho pulse sequences of the lumbar spine were obtained without and with intravenous contrast. CONTRAST:  6.87mL GADAVIST GADOBUTROL 1 MMOL/ML IV SOLN COMPARISON:  03/10/2021 lumbar spine MRI, correlation is made with same day CT lumbar spine. FINDINGS: Segmentation:  Standard. Alignment: Dextrocurvature of the lumbar spine. Unchanged mild retrolisthesis L1 on L2. Unchanged grade 1 anterolisthesis L3 on L4. Vertebrae: No acute fracture or suspicious osseous lesion. Status post L4-L5 posterior fusion and decompression. No abnormal enhancement. Conus medullaris and cauda equina: Conus extends to the L1-L2 level. Conus and cauda equina appear normal. No abnormal enhancement. Paraspinal and other soft tissues: Postoperative changes in the soft tissues posterior to the lower lumbar spine. Otherwise negative. Disc levels: T12-L1: No significant disc bulge. No spinal canal stenosis  or neural foraminal narrowing. L1-L2: Mild retrolisthesis with broad-based disc bulge. Moderate facet arthropathy. Ligamentum flavum hypertrophy. Mild narrowing of the lateral recesses. Minimal spinal canal stenosis, unchanged. Mild right neural foraminal narrowing, unchanged. L2-L3: Disc height loss with left eccentric disc bulge, with left extreme lateral disc protrusion. Moderate facet arthropathy and ligamentum flavum hypertrophy. Mild narrowing of the left lateral recess, which could affect the descending left L3 nerve. No spinal canal stenosis. No neural foraminal narrowing. L3-L4: Status post fusion of the spinous processes. Unchanged grade 1 anterolisthesis with disc unroofing and moderate disc bulge. Moderate to severe facet  arthropathy, left greater than right. Ligamentum flavum hypertrophy. Mild spinal canal stenosis. Effacement of the left lateral recess. Mild bilateral neural foraminal narrowing, unchanged. L4-L5: Status post decompression and fusion. No spinal canal stenosis or neural foraminal narrowing. L5-S1: Minimal disc bulge. Severe right and mild left facet arthropathy. No spinal canal stenosis or neural foraminal narrowing. IMPRESSION: 1. L3-L4 mild spinal canal stenosis, with effacement of the left lateral recess that likely compresses the descending left L4 nerve. In addition there is mild bilateral neural foraminal narrowing. Overall this level appears unchanged compared to 03/10/2021. 2. Other levels are also unchanged compared to 03/10/2021. No abnormal enhancement. Electronically Signed   By: Merilyn Baba M.D.   On: 04/19/2021 20:59   CT Abdomen Pelvis W Contrast  Result Date: 04/02/2021 CLINICAL DATA:  Elevated Ca125   Chronic bloating and distension EXAM: CT ABDOMEN AND PELVIS WITH CONTRAST TECHNIQUE: Multidetector CT imaging of the abdomen and pelvis was performed using the standard protocol following bolus administration of intravenous contrast. CONTRAST:  67mL OMNIPAQUE IOHEXOL 350 MG/ML SOLN COMPARISON:  April 25, 2016 FINDINGS: Lower chest: No acute abnormality.  Chronic scarring of the lingula. Hepatobiliary: Status post cholecystectomy. Unchanged appearance of mild intrahepatic and extrahepatic biliary ductal dilation, most likely due to post cholecystectomy state. Portal vein is patent. Scattered coarse calcifications, likely the sequela of remote prior granulomatous infection. Pancreas: Unremarkable. No pancreatic ductal dilatation or surrounding inflammatory changes. Spleen: Normal in size without focal abnormality. Adrenals/Urinary Tract: Adrenal glands are unremarkable. Kidneys enhance symmetrically. No hydronephrosis. No obstructing nephrolithiasis. Bladder is decompressed. Stomach/Bowel: No  evidence of bowel obstruction. Sigmoid diverticulosis. Low-density stool throughout the colon consistent with steatorrhea. Diverticulosis of the ileum. Large hiatal hernia. Appendix is normal. Anal prominence; recommend correlation with physical exam. Vascular/Lymphatic: Vascular calcifications. No aneurysmal dilation of the aorta. No suspicious lymphadenopathy. Reproductive: Status post hysterectomy. No adnexal masses. Other: No free air or free fluid. Musculoskeletal: Compression fracture deformity of T11 status post vertebral augmentation, similar in comparison to prior. Status post posterior fixation with intervertebral body spacer placement of L4-5. Grade 1 anterolisthesis of L3-4 with severe degenerative changes at this level. IMPRESSION: 1. Prominence of the anus; recommend correlation with physical exam. 2. Steatorrhea. 3. Incidental note of small bowel diverticulosis without evidence of acute diverticulitis. Aortic Atherosclerosis (ICD10-I70.0). Electronically Signed   By: Valentino Saxon M.D.   On: 04/02/2021 09:11   DG Knee Complete 4 Views Left  Result Date: 04/19/2021 CLINICAL DATA:  Trauma, fall, pain EXAM: LEFT KNEE - COMPLETE 4+ VIEW COMPARISON:  None. FINDINGS: No recent fracture or dislocation is seen. There is previous left knee arthroplasty. There is no significant effusion in the suprapatellar bursa. IMPRESSION: Previous left knee arthroplasty. No recent fracture or dislocation is seen. Electronically Signed   By: Elmer Picker M.D.   On: 04/19/2021 13:30   ECHOCARDIOGRAM COMPLETE  Result Date: 04/21/2021    ECHOCARDIOGRAM REPORT  Patient Name:   MANAL KREUTZER Date of Exam: 04/21/2021 Medical Rec #:  [id number]      Height:       61.0 in Accession #:    [id number]     Weight:       138.2 lb Date of Birth:  01-22-1934      BSA:          1.614 m Patient Age:    87 years       BP:           140/96 mmHg Patient Gender: F              HR:           91 bpm. Exam Location:   Inpatient Procedure: 2D Echo, Cardiac Doppler and Color Doppler Indications:    Stroke  History:        Patient has no prior history of Echocardiogram examinations.                 CAD; Risk Factors:Hypertension and Dyslipidemia.  Sonographer:    Bernadene Person RDCS Referring Phys: [id number] [place]  1. Left ventricular ejection fraction, by estimation, is 60 to 65%. The left ventricle has normal function. The left ventricle has no regional wall motion abnormalities. Left ventricular diastolic parameters are consistent with Grade I diastolic dysfunction (impaired relaxation).  2. Right ventricular systolic function is normal. The right ventricular size is normal. Tricuspid regurgitation signal is inadequate for assessing PA pressure.  3. The mitral valve is normal in structure. Trivial mitral valve regurgitation. No evidence of mitral stenosis.  4. The aortic valve is tricuspid. Aortic valve regurgitation is trivial. No aortic stenosis is present. FINDINGS  Left Ventricle: Left ventricular ejection fraction, by estimation, is 60 to 65%. The left ventricle has normal function. The left ventricle has no regional wall motion abnormalities. The left ventricular internal cavity size was normal in size. There is  no left ventricular hypertrophy. Left ventricular diastolic parameters are consistent with Grade I diastolic dysfunction (impaired relaxation). Right Ventricle: The right ventricular size is normal. No increase in right ventricular wall thickness. Right ventricular systolic function is normal. Tricuspid regurgitation signal is inadequate for assessing PA pressure. Left Atrium: Left atrial size was normal in size. Right Atrium: Right atrial size was normal in size. Pericardium: There is no evidence of pericardial effusion. Mitral Valve: The mitral valve is normal in structure. Trivial mitral valve regurgitation. No evidence of mitral valve stenosis. Tricuspid Valve: The tricuspid valve is  normal in structure. Tricuspid valve regurgitation is trivial. Aortic Valve: The aortic valve is tricuspid. Aortic valve regurgitation is trivial. Aortic regurgitation PHT measures 568 msec. No aortic stenosis is present. Pulmonic Valve: The pulmonic valve was not well visualized. Pulmonic valve regurgitation is not visualized. Aorta: The aortic root and ascending aorta are structurally normal, with no evidence of dilitation. IAS/Shunts: The interatrial septum was not well visualized.  LEFT VENTRICLE PLAX 2D LVIDd:         3.90 cm   Diastology LVIDs:         2.80 cm   LV e' medial:    4.50 cm/s LV PW:         0.80 cm   LV E/e' medial:  13.2 LV IVS:        0.90 cm   LV e' lateral:   8.81 cm/s LVOT diam:     2.00 cm   LV E/e' lateral: 6.8 LV SV:  39 LV SV Index:   24 LVOT Area:     3.14 cm  RIGHT VENTRICLE RV S prime:     16.00 cm/s TAPSE (M-mode): 2.3 cm LEFT ATRIUM             Index        RIGHT ATRIUM           Index LA diam:        2.30 cm 1.42 cm/m   RA Area:     13.10 cm LA Vol (A2C):   35.0 ml 21.68 ml/m  RA Volume:   28.70 ml  17.78 ml/m LA Vol (A4C):   30.8 ml 19.08 ml/m LA Biplane Vol: 33.7 ml 20.88 ml/m  AORTIC VALVE LVOT Vmax:   80.10 cm/s LVOT Vmean:  50.600 cm/s LVOT VTI:    0.125 m AI PHT:      568 msec  AORTA Ao Root diam: 2.90 cm Ao Asc diam:  3.10 cm MITRAL VALVE MV Area (PHT): 4.63 cm    SHUNTS MV Decel Time: 164 msec    Systemic VTI:  0.12 m MV E velocity: 59.60 cm/s  Systemic Diam: 2.00 cm MV A velocity: 96.00 cm/s MV E/A ratio:  0.62 Oswaldo Milian MD Electronically signed by Oswaldo Milian MD Signature Date/Time: 04/21/2021/3:15:45 PM    Final    DG HIP UNILAT WITH PELVIS 2-3 VIEWS LEFT  Result Date: 04/20/2021 CLINICAL DATA:  Bilateral hip pain.  Fall. EXAM: DG HIP (WITH OR WITHOUT PELVIS) 2-3V LEFT COMPARISON:  None. FINDINGS: Degenerative changes in the hips with joint space narrowing and spurring. Postoperative changes in the lower lumbar spine. No acute bony  abnormality. Specifically, no fracture, subluxation, or dislocation. IMPRESSION: No acute bony abnormality. Electronically Signed   By: Rolm Baptise M.D.   On: 04/20/2021 10:56   DG HIP UNILAT WITH PELVIS 2-3 VIEWS RIGHT  Result Date: 04/20/2021 CLINICAL DATA:  Acute bilateral hip pain after fall several days ago. EXAM: DG HIP (WITH OR WITHOUT PELVIS) 2-3V RIGHT COMPARISON:  None. FINDINGS: There is no evidence of hip fracture or dislocation. Mild narrowing and osteophyte formation is seen involving the right hip. 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 No acute abnormality seen. Electronically Signed   By: Marijo Conception M.D.   On: 04/20/2021 10:40    Microbiology: Recent Results (from the past 240 hour(s))  Resp Panel by RT-PCR (Flu A&B, Covid) Nasopharyngeal Swab     Status: None   Collection Time: 04/19/21  2:59 PM   Specimen: Nasopharyngeal Swab; Nasopharyngeal(NP) swabs in vial transport medium  Result Value Ref Range Status   SARS Coronavirus 2 by RT PCR NEGATIVE NEGATIVE Final    Comment: (NOTE) SARS-CoV-2 target nucleic acids are NOT DETECTED.  The SARS-CoV-2 RNA is generally detectable in upper respiratory specimens during the acute phase of infection. The lowest concentration of SARS-CoV-2 viral copies this assay can detect is 138 copies/mL. A negative result does not preclude SARS-Cov-2 infection and should not be used as the sole basis for treatment or other patient management decisions. A negative result may occur with  improper specimen collection/handling, submission of specimen other than nasopharyngeal swab, presence of viral mutation(s) within the areas targeted by this assay, and inadequate number of viral copies(<138 copies/mL). A negative result must be combined with clinical observations, patient history, and epidemiological information. The expected result is Negative.  Fact Sheet for Patients:   EntrepreneurPulse.com.au  Fact Sheet for Healthcare Providers:  IncredibleEmployment.be  This test is no t yet approved or cleared by the [place] and  has been authorized for detection and/or diagnosis of SARS-CoV-2 by FDA under an Emergency Use Authorization (EUA). This EUA will remain  in effect (meaning this test can be used) for the duration of the COVID-19 declaration under Section 564(b)(1) of the Act, 21 U.S.C.section 360bbb-3(b)(1), unless the authorization is terminated  or revoked sooner.       Influenza A by PCR NEGATIVE NEGATIVE Final   Influenza B by PCR NEGATIVE NEGATIVE Final    Comment: (NOTE) The Xpert Xpress SARS-CoV-2/FLU/RSV plus assay is intended as an aid in the diagnosis of influenza from Nasopharyngeal swab specimens and should not be used as a sole basis for treatment. Nasal washings and aspirates are unacceptable for Xpert Xpress SARS-CoV-2/FLU/RSV testing.  Fact Sheet for Patients: EntrepreneurPulse.com.au  Fact Sheet for Healthcare Providers: IncredibleEmployment.be  This test is not yet approved or cleared by the [place] FDA and has been authorized for detection and/or diagnosis of SARS-CoV-2 by FDA under an Emergency Use Authorization (EUA). This EUA will remain in effect (meaning this test can be used) for the duration of the COVID-19 declaration under Section 564(b)(1) of the Act, 21 U.S.C. section 360bbb-3(b)(1), unless the authorization is terminated or revoked.  Performed at [facility], [address], [place], [place] [address]      Labs: Basic Metabolic Panel: Recent Labs  Lab 04/19/21 1450 04/20/21 0316 04/22/21 0401  NA 135 133* 131*  K 3.4* 3.5 3.5  CL 95* 94* 93*  CO2 29 30 28   GLUCOSE 105* 164* 100*  BUN 14 15 19   CREATININE 0.79 0.69 0.73  CALCIUM 10.6* 9.6 9.2  MG  --   --  2.2   Liver Function Tests: No  results for input(s): AST, ALT, ALKPHOS, BILITOT, PROT, ALBUMIN in the last 168 hours. No results for input(s): LIPASE, AMYLASE in the last 168 hours. Recent Labs  Lab 04/19/21 1515  AMMONIA 15   CBC: Recent Labs  Lab 04/20/21 0316  WBC 7.5  HGB 13.8  HCT 41.6  MCV 95.6  PLT 322   Cardiac Enzymes: No results for input(s): CKTOTAL, CKMB, CKMBINDEX, TROPONINI in the last 168 hours. BNP: BNP (last 3 results) No results for input(s): BNP in the last 8760 hours.  ProBNP (last 3 results) No results for input(s): PROBNP in the last 8760 hours.  CBG: No results for input(s): GLUCAP in the last 168 hours.     Signed:  Florencia Reasons MD, PhD, FACP  [facility] 04/22/2021, 2:29 PM

## 2021-04-22 NOTE — TOC Transition Note (Addendum)
Transition of Care [facility]) - CM/SW Discharge Note   Patient Details  Name: [name] MRN: [id number] Date of Birth: 07-31-1933  [facility] CM/SW Contact:  Dessa Phi, RN Phone Number: 04/22/2021, 10:28 AM   Clinical Narrative: No preference-[facility] rep [facility]-HHPT/OT. Family to transport home. No further CM needs.  11:20-updated HHPT/RN only needed-[facility] rep [facility] aware. No further CM needs.     Final next level of care: Home w Home Health Services Barriers to Discharge: No Barriers Identified   Patient Goals and CMS Choice        Discharge Placement                       Discharge Plan and Services   Discharge Planning Services: CM Consult                      HH Arranged: PT, OT HH Agency: [facility] ([facility]) Date HH Agency Contacted: 04/22/21 Time [place]: 1028 Representative spoke with at [place]: [place] Determinants of Health ([place]) Interventions     Readmission Risk Interventions No flowsheet data found.

## 2021-04-22 NOTE — Progress Notes (Signed)
Patient discharged home with grandson.  IV removed - WNL.  Reviewed AVS, including medications and follow up appointments.  [place] set up per CM.  Patient verbalizes understanding - no questions at this time.  Assisted off unit via WC in NAD

## 2021-04-23 ENCOUNTER — Telehealth: Payer: Self-pay | Admitting: Physician Assistant

## 2021-04-23 DIAGNOSIS — I639 Cerebral infarction, unspecified: Secondary | ICD-10-CM

## 2021-04-23 NOTE — Telephone Encounter (Addendum)
   I am covering cardmaster today and we received this message to Trish's inbox:  Florencia Reasons, MD  [facility] Please arrange event monitor for this patient to rule out AFib, she is admitted for acute bilateral CVA, need to rule out afib  Thank you   Annamaria Boots    Patient has already been discharged. Will route staff msg to our monitor team to please help arrange and notify patient this will be sent to her house; will also route the staff msg to scheduling team to arrange post-monitor follow-up for review. Order for 30 day monitor placed with notation for Dr. Tamala Julian to read (primary cardiologist).  Joseandres Mazer PA-C

## 2021-04-24 DIAGNOSIS — Z9181 History of falling: Secondary | ICD-10-CM | POA: Diagnosis not present

## 2021-04-24 DIAGNOSIS — F419 Anxiety disorder, unspecified: Secondary | ICD-10-CM | POA: Diagnosis not present

## 2021-04-24 DIAGNOSIS — E876 Hypokalemia: Secondary | ICD-10-CM | POA: Diagnosis not present

## 2021-04-24 DIAGNOSIS — G629 Polyneuropathy, unspecified: Secondary | ICD-10-CM | POA: Diagnosis not present

## 2021-04-24 DIAGNOSIS — Z7902 Long term (current) use of antithrombotics/antiplatelets: Secondary | ICD-10-CM | POA: Diagnosis not present

## 2021-04-24 DIAGNOSIS — M1990 Unspecified osteoarthritis, unspecified site: Secondary | ICD-10-CM | POA: Diagnosis not present

## 2021-04-24 DIAGNOSIS — D472 Monoclonal gammopathy: Secondary | ICD-10-CM | POA: Diagnosis not present

## 2021-04-24 DIAGNOSIS — K219 Gastro-esophageal reflux disease without esophagitis: Secondary | ICD-10-CM | POA: Diagnosis not present

## 2021-04-24 DIAGNOSIS — M48062 Spinal stenosis, lumbar region with neurogenic claudication: Secondary | ICD-10-CM | POA: Diagnosis not present

## 2021-04-24 DIAGNOSIS — I69334 Monoplegia of upper limb following cerebral infarction affecting left non-dominant side: Secondary | ICD-10-CM | POA: Diagnosis not present

## 2021-04-24 DIAGNOSIS — R410 Disorientation, unspecified: Secondary | ICD-10-CM | POA: Diagnosis not present

## 2021-04-24 DIAGNOSIS — E039 Hypothyroidism, unspecified: Secondary | ICD-10-CM | POA: Diagnosis not present

## 2021-04-24 DIAGNOSIS — E785 Hyperlipidemia, unspecified: Secondary | ICD-10-CM | POA: Diagnosis not present

## 2021-04-24 DIAGNOSIS — I2510 Atherosclerotic heart disease of native coronary artery without angina pectoris: Secondary | ICD-10-CM | POA: Diagnosis not present

## 2021-04-24 DIAGNOSIS — M810 Age-related osteoporosis without current pathological fracture: Secondary | ICD-10-CM | POA: Diagnosis not present

## 2021-04-24 DIAGNOSIS — G2581 Restless legs syndrome: Secondary | ICD-10-CM | POA: Diagnosis not present

## 2021-04-24 DIAGNOSIS — I10 Essential (primary) hypertension: Secondary | ICD-10-CM | POA: Diagnosis not present

## 2021-04-24 DIAGNOSIS — E871 Hypo-osmolality and hyponatremia: Secondary | ICD-10-CM | POA: Diagnosis not present

## 2021-04-27 DIAGNOSIS — G629 Polyneuropathy, unspecified: Secondary | ICD-10-CM | POA: Diagnosis not present

## 2021-04-27 DIAGNOSIS — M48062 Spinal stenosis, lumbar region with neurogenic claudication: Secondary | ICD-10-CM | POA: Diagnosis not present

## 2021-04-27 DIAGNOSIS — R410 Disorientation, unspecified: Secondary | ICD-10-CM | POA: Diagnosis not present

## 2021-04-27 DIAGNOSIS — I69334 Monoplegia of upper limb following cerebral infarction affecting left non-dominant side: Secondary | ICD-10-CM | POA: Diagnosis not present

## 2021-04-27 DIAGNOSIS — I10 Essential (primary) hypertension: Secondary | ICD-10-CM | POA: Diagnosis not present

## 2021-04-27 DIAGNOSIS — I2510 Atherosclerotic heart disease of native coronary artery without angina pectoris: Secondary | ICD-10-CM | POA: Diagnosis not present

## 2021-04-28 DIAGNOSIS — I10 Essential (primary) hypertension: Secondary | ICD-10-CM | POA: Diagnosis not present

## 2021-04-28 DIAGNOSIS — E039 Hypothyroidism, unspecified: Secondary | ICD-10-CM | POA: Diagnosis not present

## 2021-04-28 DIAGNOSIS — E785 Hyperlipidemia, unspecified: Secondary | ICD-10-CM | POA: Diagnosis not present

## 2021-04-28 DIAGNOSIS — M810 Age-related osteoporosis without current pathological fracture: Secondary | ICD-10-CM | POA: Diagnosis not present

## 2021-04-29 DIAGNOSIS — G629 Polyneuropathy, unspecified: Secondary | ICD-10-CM | POA: Diagnosis not present

## 2021-04-29 DIAGNOSIS — I2510 Atherosclerotic heart disease of native coronary artery without angina pectoris: Secondary | ICD-10-CM | POA: Diagnosis not present

## 2021-04-29 DIAGNOSIS — I10 Essential (primary) hypertension: Secondary | ICD-10-CM | POA: Diagnosis not present

## 2021-04-29 DIAGNOSIS — R410 Disorientation, unspecified: Secondary | ICD-10-CM | POA: Diagnosis not present

## 2021-04-29 DIAGNOSIS — I69334 Monoplegia of upper limb following cerebral infarction affecting left non-dominant side: Secondary | ICD-10-CM | POA: Diagnosis not present

## 2021-04-29 DIAGNOSIS — M48062 Spinal stenosis, lumbar region with neurogenic claudication: Secondary | ICD-10-CM | POA: Diagnosis not present

## 2021-05-03 DIAGNOSIS — G629 Polyneuropathy, unspecified: Secondary | ICD-10-CM | POA: Diagnosis not present

## 2021-05-03 DIAGNOSIS — M48062 Spinal stenosis, lumbar region with neurogenic claudication: Secondary | ICD-10-CM | POA: Diagnosis not present

## 2021-05-03 DIAGNOSIS — I69334 Monoplegia of upper limb following cerebral infarction affecting left non-dominant side: Secondary | ICD-10-CM | POA: Diagnosis not present

## 2021-05-03 DIAGNOSIS — I10 Essential (primary) hypertension: Secondary | ICD-10-CM | POA: Diagnosis not present

## 2021-05-03 DIAGNOSIS — I2510 Atherosclerotic heart disease of native coronary artery without angina pectoris: Secondary | ICD-10-CM | POA: Diagnosis not present

## 2021-05-03 DIAGNOSIS — R410 Disorientation, unspecified: Secondary | ICD-10-CM | POA: Diagnosis not present

## 2021-05-04 DIAGNOSIS — R410 Disorientation, unspecified: Secondary | ICD-10-CM | POA: Diagnosis not present

## 2021-05-04 DIAGNOSIS — G629 Polyneuropathy, unspecified: Secondary | ICD-10-CM | POA: Diagnosis not present

## 2021-05-04 DIAGNOSIS — M48062 Spinal stenosis, lumbar region with neurogenic claudication: Secondary | ICD-10-CM | POA: Diagnosis not present

## 2021-05-04 DIAGNOSIS — I69334 Monoplegia of upper limb following cerebral infarction affecting left non-dominant side: Secondary | ICD-10-CM | POA: Diagnosis not present

## 2021-05-04 DIAGNOSIS — I2510 Atherosclerotic heart disease of native coronary artery without angina pectoris: Secondary | ICD-10-CM | POA: Diagnosis not present

## 2021-05-04 DIAGNOSIS — I10 Essential (primary) hypertension: Secondary | ICD-10-CM | POA: Diagnosis not present

## 2021-05-05 DIAGNOSIS — G629 Polyneuropathy, unspecified: Secondary | ICD-10-CM | POA: Diagnosis not present

## 2021-05-05 DIAGNOSIS — E039 Hypothyroidism, unspecified: Secondary | ICD-10-CM | POA: Diagnosis not present

## 2021-05-05 DIAGNOSIS — G459 Transient cerebral ischemic attack, unspecified: Secondary | ICD-10-CM | POA: Diagnosis not present

## 2021-05-05 DIAGNOSIS — Z1339 Encounter for screening examination for other mental health and behavioral disorders: Secondary | ICD-10-CM | POA: Diagnosis not present

## 2021-05-05 DIAGNOSIS — R82998 Other abnormal findings in urine: Secondary | ICD-10-CM | POA: Diagnosis not present

## 2021-05-05 DIAGNOSIS — N182 Chronic kidney disease, stage 2 (mild): Secondary | ICD-10-CM | POA: Diagnosis not present

## 2021-05-05 DIAGNOSIS — M48062 Spinal stenosis, lumbar region with neurogenic claudication: Secondary | ICD-10-CM | POA: Diagnosis not present

## 2021-05-05 DIAGNOSIS — I10 Essential (primary) hypertension: Secondary | ICD-10-CM | POA: Diagnosis not present

## 2021-05-05 DIAGNOSIS — I700 Atherosclerosis of aorta: Secondary | ICD-10-CM | POA: Diagnosis not present

## 2021-05-05 DIAGNOSIS — Z0000 Encounter for general adult medical examination without abnormal findings: Secondary | ICD-10-CM | POA: Diagnosis not present

## 2021-05-05 DIAGNOSIS — I2510 Atherosclerotic heart disease of native coronary artery without angina pectoris: Secondary | ICD-10-CM | POA: Diagnosis not present

## 2021-05-05 DIAGNOSIS — Z1331 Encounter for screening for depression: Secondary | ICD-10-CM | POA: Diagnosis not present

## 2021-05-05 DIAGNOSIS — I69334 Monoplegia of upper limb following cerebral infarction affecting left non-dominant side: Secondary | ICD-10-CM | POA: Diagnosis not present

## 2021-05-05 DIAGNOSIS — R410 Disorientation, unspecified: Secondary | ICD-10-CM | POA: Diagnosis not present

## 2021-05-05 DIAGNOSIS — I129 Hypertensive chronic kidney disease with stage 1 through stage 4 chronic kidney disease, or unspecified chronic kidney disease: Secondary | ICD-10-CM | POA: Diagnosis not present

## 2021-05-06 DIAGNOSIS — I10 Essential (primary) hypertension: Secondary | ICD-10-CM | POA: Diagnosis not present

## 2021-05-06 DIAGNOSIS — I2510 Atherosclerotic heart disease of native coronary artery without angina pectoris: Secondary | ICD-10-CM | POA: Diagnosis not present

## 2021-05-06 DIAGNOSIS — I69334 Monoplegia of upper limb following cerebral infarction affecting left non-dominant side: Secondary | ICD-10-CM | POA: Diagnosis not present

## 2021-05-06 DIAGNOSIS — R410 Disorientation, unspecified: Secondary | ICD-10-CM | POA: Diagnosis not present

## 2021-05-06 DIAGNOSIS — G629 Polyneuropathy, unspecified: Secondary | ICD-10-CM | POA: Diagnosis not present

## 2021-05-06 DIAGNOSIS — M48062 Spinal stenosis, lumbar region with neurogenic claudication: Secondary | ICD-10-CM | POA: Diagnosis not present

## 2021-05-07 DIAGNOSIS — M48062 Spinal stenosis, lumbar region with neurogenic claudication: Secondary | ICD-10-CM | POA: Diagnosis not present

## 2021-05-07 DIAGNOSIS — I2510 Atherosclerotic heart disease of native coronary artery without angina pectoris: Secondary | ICD-10-CM | POA: Diagnosis not present

## 2021-05-07 DIAGNOSIS — R410 Disorientation, unspecified: Secondary | ICD-10-CM | POA: Diagnosis not present

## 2021-05-07 DIAGNOSIS — I10 Essential (primary) hypertension: Secondary | ICD-10-CM | POA: Diagnosis not present

## 2021-05-07 DIAGNOSIS — G629 Polyneuropathy, unspecified: Secondary | ICD-10-CM | POA: Diagnosis not present

## 2021-05-07 DIAGNOSIS — I69334 Monoplegia of upper limb following cerebral infarction affecting left non-dominant side: Secondary | ICD-10-CM | POA: Diagnosis not present

## 2021-05-10 DIAGNOSIS — I10 Essential (primary) hypertension: Secondary | ICD-10-CM | POA: Diagnosis not present

## 2021-05-10 DIAGNOSIS — I2510 Atherosclerotic heart disease of native coronary artery without angina pectoris: Secondary | ICD-10-CM | POA: Diagnosis not present

## 2021-05-10 DIAGNOSIS — I69334 Monoplegia of upper limb following cerebral infarction affecting left non-dominant side: Secondary | ICD-10-CM | POA: Diagnosis not present

## 2021-05-10 DIAGNOSIS — G629 Polyneuropathy, unspecified: Secondary | ICD-10-CM | POA: Diagnosis not present

## 2021-05-10 DIAGNOSIS — R410 Disorientation, unspecified: Secondary | ICD-10-CM | POA: Diagnosis not present

## 2021-05-10 DIAGNOSIS — M48062 Spinal stenosis, lumbar region with neurogenic claudication: Secondary | ICD-10-CM | POA: Diagnosis not present

## 2021-05-12 DIAGNOSIS — G629 Polyneuropathy, unspecified: Secondary | ICD-10-CM | POA: Diagnosis not present

## 2021-05-12 DIAGNOSIS — I2510 Atherosclerotic heart disease of native coronary artery without angina pectoris: Secondary | ICD-10-CM | POA: Diagnosis not present

## 2021-05-12 DIAGNOSIS — I10 Essential (primary) hypertension: Secondary | ICD-10-CM | POA: Diagnosis not present

## 2021-05-12 DIAGNOSIS — I69334 Monoplegia of upper limb following cerebral infarction affecting left non-dominant side: Secondary | ICD-10-CM | POA: Diagnosis not present

## 2021-05-12 DIAGNOSIS — R410 Disorientation, unspecified: Secondary | ICD-10-CM | POA: Diagnosis not present

## 2021-05-12 DIAGNOSIS — M48062 Spinal stenosis, lumbar region with neurogenic claudication: Secondary | ICD-10-CM | POA: Diagnosis not present

## 2021-05-13 DIAGNOSIS — R410 Disorientation, unspecified: Secondary | ICD-10-CM | POA: Diagnosis not present

## 2021-05-13 DIAGNOSIS — I69334 Monoplegia of upper limb following cerebral infarction affecting left non-dominant side: Secondary | ICD-10-CM | POA: Diagnosis not present

## 2021-05-13 DIAGNOSIS — M48062 Spinal stenosis, lumbar region with neurogenic claudication: Secondary | ICD-10-CM | POA: Diagnosis not present

## 2021-05-13 DIAGNOSIS — I10 Essential (primary) hypertension: Secondary | ICD-10-CM | POA: Diagnosis not present

## 2021-05-13 DIAGNOSIS — G629 Polyneuropathy, unspecified: Secondary | ICD-10-CM | POA: Diagnosis not present

## 2021-05-13 DIAGNOSIS — I2510 Atherosclerotic heart disease of native coronary artery without angina pectoris: Secondary | ICD-10-CM | POA: Diagnosis not present

## 2021-05-17 DIAGNOSIS — I2510 Atherosclerotic heart disease of native coronary artery without angina pectoris: Secondary | ICD-10-CM | POA: Diagnosis not present

## 2021-05-17 DIAGNOSIS — I10 Essential (primary) hypertension: Secondary | ICD-10-CM | POA: Diagnosis not present

## 2021-05-17 DIAGNOSIS — G629 Polyneuropathy, unspecified: Secondary | ICD-10-CM | POA: Diagnosis not present

## 2021-05-17 DIAGNOSIS — I69334 Monoplegia of upper limb following cerebral infarction affecting left non-dominant side: Secondary | ICD-10-CM | POA: Diagnosis not present

## 2021-05-17 DIAGNOSIS — R410 Disorientation, unspecified: Secondary | ICD-10-CM | POA: Diagnosis not present

## 2021-05-17 DIAGNOSIS — M48062 Spinal stenosis, lumbar region with neurogenic claudication: Secondary | ICD-10-CM | POA: Diagnosis not present

## 2021-05-18 DIAGNOSIS — R410 Disorientation, unspecified: Secondary | ICD-10-CM | POA: Diagnosis not present

## 2021-05-18 DIAGNOSIS — I2510 Atherosclerotic heart disease of native coronary artery without angina pectoris: Secondary | ICD-10-CM | POA: Diagnosis not present

## 2021-05-18 DIAGNOSIS — M48062 Spinal stenosis, lumbar region with neurogenic claudication: Secondary | ICD-10-CM | POA: Diagnosis not present

## 2021-05-18 DIAGNOSIS — G629 Polyneuropathy, unspecified: Secondary | ICD-10-CM | POA: Diagnosis not present

## 2021-05-18 DIAGNOSIS — I10 Essential (primary) hypertension: Secondary | ICD-10-CM | POA: Diagnosis not present

## 2021-05-18 DIAGNOSIS — I69334 Monoplegia of upper limb following cerebral infarction affecting left non-dominant side: Secondary | ICD-10-CM | POA: Diagnosis not present

## 2021-05-19 DIAGNOSIS — I10 Essential (primary) hypertension: Secondary | ICD-10-CM | POA: Diagnosis not present

## 2021-05-19 DIAGNOSIS — I69334 Monoplegia of upper limb following cerebral infarction affecting left non-dominant side: Secondary | ICD-10-CM | POA: Diagnosis not present

## 2021-05-19 DIAGNOSIS — I2510 Atherosclerotic heart disease of native coronary artery without angina pectoris: Secondary | ICD-10-CM | POA: Diagnosis not present

## 2021-05-19 DIAGNOSIS — G629 Polyneuropathy, unspecified: Secondary | ICD-10-CM | POA: Diagnosis not present

## 2021-05-19 DIAGNOSIS — R410 Disorientation, unspecified: Secondary | ICD-10-CM | POA: Diagnosis not present

## 2021-05-19 DIAGNOSIS — M48062 Spinal stenosis, lumbar region with neurogenic claudication: Secondary | ICD-10-CM | POA: Diagnosis not present

## 2021-05-20 DIAGNOSIS — I69334 Monoplegia of upper limb following cerebral infarction affecting left non-dominant side: Secondary | ICD-10-CM | POA: Diagnosis not present

## 2021-05-20 DIAGNOSIS — M48062 Spinal stenosis, lumbar region with neurogenic claudication: Secondary | ICD-10-CM | POA: Diagnosis not present

## 2021-05-20 DIAGNOSIS — I10 Essential (primary) hypertension: Secondary | ICD-10-CM | POA: Diagnosis not present

## 2021-05-20 DIAGNOSIS — I2510 Atherosclerotic heart disease of native coronary artery without angina pectoris: Secondary | ICD-10-CM | POA: Diagnosis not present

## 2021-05-20 DIAGNOSIS — R410 Disorientation, unspecified: Secondary | ICD-10-CM | POA: Diagnosis not present

## 2021-05-20 DIAGNOSIS — G629 Polyneuropathy, unspecified: Secondary | ICD-10-CM | POA: Diagnosis not present

## 2021-05-24 DIAGNOSIS — Z9181 History of falling: Secondary | ICD-10-CM | POA: Diagnosis not present

## 2021-05-24 DIAGNOSIS — G2581 Restless legs syndrome: Secondary | ICD-10-CM | POA: Diagnosis not present

## 2021-05-24 DIAGNOSIS — E039 Hypothyroidism, unspecified: Secondary | ICD-10-CM | POA: Diagnosis not present

## 2021-05-24 DIAGNOSIS — I2510 Atherosclerotic heart disease of native coronary artery without angina pectoris: Secondary | ICD-10-CM | POA: Diagnosis not present

## 2021-05-24 DIAGNOSIS — I69334 Monoplegia of upper limb following cerebral infarction affecting left non-dominant side: Secondary | ICD-10-CM | POA: Diagnosis not present

## 2021-05-24 DIAGNOSIS — K219 Gastro-esophageal reflux disease without esophagitis: Secondary | ICD-10-CM | POA: Diagnosis not present

## 2021-05-24 DIAGNOSIS — G629 Polyneuropathy, unspecified: Secondary | ICD-10-CM | POA: Diagnosis not present

## 2021-05-24 DIAGNOSIS — E876 Hypokalemia: Secondary | ICD-10-CM | POA: Diagnosis not present

## 2021-05-24 DIAGNOSIS — M810 Age-related osteoporosis without current pathological fracture: Secondary | ICD-10-CM | POA: Diagnosis not present

## 2021-05-24 DIAGNOSIS — F419 Anxiety disorder, unspecified: Secondary | ICD-10-CM | POA: Diagnosis not present

## 2021-05-24 DIAGNOSIS — M48062 Spinal stenosis, lumbar region with neurogenic claudication: Secondary | ICD-10-CM | POA: Diagnosis not present

## 2021-05-24 DIAGNOSIS — D472 Monoclonal gammopathy: Secondary | ICD-10-CM | POA: Diagnosis not present

## 2021-05-24 DIAGNOSIS — Z7902 Long term (current) use of antithrombotics/antiplatelets: Secondary | ICD-10-CM | POA: Diagnosis not present

## 2021-05-24 DIAGNOSIS — E871 Hypo-osmolality and hyponatremia: Secondary | ICD-10-CM | POA: Diagnosis not present

## 2021-05-24 DIAGNOSIS — E785 Hyperlipidemia, unspecified: Secondary | ICD-10-CM | POA: Diagnosis not present

## 2021-05-24 DIAGNOSIS — I10 Essential (primary) hypertension: Secondary | ICD-10-CM | POA: Diagnosis not present

## 2021-05-24 DIAGNOSIS — R410 Disorientation, unspecified: Secondary | ICD-10-CM | POA: Diagnosis not present

## 2021-05-24 DIAGNOSIS — M1990 Unspecified osteoarthritis, unspecified site: Secondary | ICD-10-CM | POA: Diagnosis not present

## 2021-05-26 DIAGNOSIS — R410 Disorientation, unspecified: Secondary | ICD-10-CM | POA: Diagnosis not present

## 2021-05-26 DIAGNOSIS — G629 Polyneuropathy, unspecified: Secondary | ICD-10-CM | POA: Diagnosis not present

## 2021-05-26 DIAGNOSIS — I10 Essential (primary) hypertension: Secondary | ICD-10-CM | POA: Diagnosis not present

## 2021-05-26 DIAGNOSIS — I69334 Monoplegia of upper limb following cerebral infarction affecting left non-dominant side: Secondary | ICD-10-CM | POA: Diagnosis not present

## 2021-05-26 DIAGNOSIS — I2510 Atherosclerotic heart disease of native coronary artery without angina pectoris: Secondary | ICD-10-CM | POA: Diagnosis not present

## 2021-05-26 DIAGNOSIS — M48062 Spinal stenosis, lumbar region with neurogenic claudication: Secondary | ICD-10-CM | POA: Diagnosis not present

## 2021-05-27 ENCOUNTER — Other Ambulatory Visit: Payer: Self-pay | Admitting: Internal Medicine

## 2021-05-27 ENCOUNTER — Other Ambulatory Visit (HOSPITAL_COMMUNITY): Payer: Self-pay | Admitting: Internal Medicine

## 2021-05-27 DIAGNOSIS — R0602 Shortness of breath: Secondary | ICD-10-CM

## 2021-05-27 DIAGNOSIS — M48062 Spinal stenosis, lumbar region with neurogenic claudication: Secondary | ICD-10-CM | POA: Diagnosis not present

## 2021-05-27 DIAGNOSIS — Z1152 Encounter for screening for COVID-19: Secondary | ICD-10-CM | POA: Diagnosis not present

## 2021-05-27 DIAGNOSIS — R058 Other specified cough: Secondary | ICD-10-CM | POA: Diagnosis not present

## 2021-05-27 DIAGNOSIS — M549 Dorsalgia, unspecified: Secondary | ICD-10-CM | POA: Diagnosis not present

## 2021-05-27 DIAGNOSIS — I679 Cerebrovascular disease, unspecified: Secondary | ICD-10-CM | POA: Diagnosis not present

## 2021-05-27 DIAGNOSIS — J90 Pleural effusion, not elsewhere classified: Secondary | ICD-10-CM | POA: Diagnosis not present

## 2021-05-27 DIAGNOSIS — R5383 Other fatigue: Secondary | ICD-10-CM | POA: Diagnosis not present

## 2021-05-27 DIAGNOSIS — G479 Sleep disorder, unspecified: Secondary | ICD-10-CM | POA: Diagnosis not present

## 2021-05-27 DIAGNOSIS — G629 Polyneuropathy, unspecified: Secondary | ICD-10-CM | POA: Diagnosis not present

## 2021-05-27 DIAGNOSIS — I2510 Atherosclerotic heart disease of native coronary artery without angina pectoris: Secondary | ICD-10-CM | POA: Diagnosis not present

## 2021-05-27 DIAGNOSIS — I69334 Monoplegia of upper limb following cerebral infarction affecting left non-dominant side: Secondary | ICD-10-CM | POA: Diagnosis not present

## 2021-05-27 DIAGNOSIS — R410 Disorientation, unspecified: Secondary | ICD-10-CM | POA: Diagnosis not present

## 2021-05-27 DIAGNOSIS — I10 Essential (primary) hypertension: Secondary | ICD-10-CM | POA: Diagnosis not present

## 2021-05-28 ENCOUNTER — Ambulatory Visit (HOSPITAL_COMMUNITY)
Admission: RE | Admit: 2021-05-28 | Discharge: 2021-05-28 | Disposition: A | Discharge status: Home / Self Care | Payer: Medicare Other | Source: Ambulatory Visit | Attending: Internal Medicine | Admitting: Internal Medicine

## 2021-05-28 ENCOUNTER — Ambulatory Visit (HOSPITAL_COMMUNITY)
Admission: RE | Admit: 2021-05-28 | Discharge: 2021-05-28 | Disposition: A | Discharge status: Home / Self Care | Payer: Medicare Other | Source: Ambulatory Visit | Attending: Radiology | Admitting: Radiology

## 2021-05-28 ENCOUNTER — Other Ambulatory Visit: Payer: Self-pay

## 2021-05-28 DIAGNOSIS — J90 Pleural effusion, not elsewhere classified: Secondary | ICD-10-CM | POA: Diagnosis not present

## 2021-05-28 DIAGNOSIS — J9811 Atelectasis: Secondary | ICD-10-CM | POA: Diagnosis not present

## 2021-05-28 DIAGNOSIS — S2232XA Fracture of one rib, left side, initial encounter for closed fracture: Secondary | ICD-10-CM | POA: Diagnosis not present

## 2021-05-28 DIAGNOSIS — R0602 Shortness of breath: Secondary | ICD-10-CM

## 2021-05-28 HISTORY — PX: IR THORACENTESIS ASP PLEURAL SPACE W/IMG GUIDE: IMG5380

## 2021-05-28 LAB — BODY FLUID CELL COUNT WITH DIFFERENTIAL
Eos, Fluid: 8 %
Lymphs, Fluid: 36 %
Monocyte-Macrophage-Serous Fluid: 12 % — ABNORMAL LOW (ref 50–90)
Neutrophil Count, Fluid: 44 % — ABNORMAL HIGH (ref 0–25)
Total Nucleated Cell Count, Fluid: 733 cu mm (ref 0–1000)

## 2021-05-28 MED ORDER — LIDOCAINE HCL 1 % IJ SOLN
INTRAMUSCULAR | Status: DC | PRN
  Administered 2021-05-28: 5 mL via INTRADERMAL

## 2021-05-28 MED ORDER — LIDOCAINE HCL 1 % IJ SOLN
INTRAMUSCULAR | Status: AC
  Filled 2021-05-28: qty 20

## 2021-05-28 NOTE — Procedures (Signed)
PROCEDURE SUMMARY:  Successful US guided left thoracentesis. Yielded 1.4 L of bloody pleural fluid. Pt tolerated procedure well. No immediate complications.  Specimen was sent for labs. CXR ordered.  EBL < 5 mL  Ascencion Dike PA-C 05/28/2021 8:20 AM

## 2021-05-29 DIAGNOSIS — M48062 Spinal stenosis, lumbar region with neurogenic claudication: Secondary | ICD-10-CM | POA: Diagnosis not present

## 2021-05-29 DIAGNOSIS — R410 Disorientation, unspecified: Secondary | ICD-10-CM | POA: Diagnosis not present

## 2021-05-29 DIAGNOSIS — I10 Essential (primary) hypertension: Secondary | ICD-10-CM | POA: Diagnosis not present

## 2021-05-29 DIAGNOSIS — I69334 Monoplegia of upper limb following cerebral infarction affecting left non-dominant side: Secondary | ICD-10-CM | POA: Diagnosis not present

## 2021-05-29 DIAGNOSIS — G629 Polyneuropathy, unspecified: Secondary | ICD-10-CM | POA: Diagnosis not present

## 2021-05-29 DIAGNOSIS — I2510 Atherosclerotic heart disease of native coronary artery without angina pectoris: Secondary | ICD-10-CM | POA: Diagnosis not present

## 2021-06-01 DIAGNOSIS — M48062 Spinal stenosis, lumbar region with neurogenic claudication: Secondary | ICD-10-CM | POA: Diagnosis not present

## 2021-06-01 DIAGNOSIS — R410 Disorientation, unspecified: Secondary | ICD-10-CM | POA: Diagnosis not present

## 2021-06-01 DIAGNOSIS — I10 Essential (primary) hypertension: Secondary | ICD-10-CM | POA: Diagnosis not present

## 2021-06-01 DIAGNOSIS — G629 Polyneuropathy, unspecified: Secondary | ICD-10-CM | POA: Diagnosis not present

## 2021-06-01 DIAGNOSIS — I69334 Monoplegia of upper limb following cerebral infarction affecting left non-dominant side: Secondary | ICD-10-CM | POA: Diagnosis not present

## 2021-06-01 DIAGNOSIS — I2510 Atherosclerotic heart disease of native coronary artery without angina pectoris: Secondary | ICD-10-CM | POA: Diagnosis not present

## 2021-06-02 LAB — PATHOLOGIST SMEAR REVIEW

## 2021-06-03 ENCOUNTER — Ambulatory Visit (INDEPENDENT_AMBULATORY_CARE_PROVIDER_SITE_OTHER): Payer: Medicare Other

## 2021-06-03 DIAGNOSIS — I4891 Unspecified atrial fibrillation: Secondary | ICD-10-CM

## 2021-06-03 DIAGNOSIS — I639 Cerebral infarction, unspecified: Secondary | ICD-10-CM

## 2021-06-03 DIAGNOSIS — R410 Disorientation, unspecified: Secondary | ICD-10-CM | POA: Diagnosis not present

## 2021-06-03 DIAGNOSIS — I69334 Monoplegia of upper limb following cerebral infarction affecting left non-dominant side: Secondary | ICD-10-CM | POA: Diagnosis not present

## 2021-06-03 DIAGNOSIS — M48062 Spinal stenosis, lumbar region with neurogenic claudication: Secondary | ICD-10-CM | POA: Diagnosis not present

## 2021-06-03 DIAGNOSIS — I2510 Atherosclerotic heart disease of native coronary artery without angina pectoris: Secondary | ICD-10-CM | POA: Diagnosis not present

## 2021-06-03 DIAGNOSIS — I10 Essential (primary) hypertension: Secondary | ICD-10-CM | POA: Diagnosis not present

## 2021-06-03 DIAGNOSIS — G629 Polyneuropathy, unspecified: Secondary | ICD-10-CM | POA: Diagnosis not present

## 2021-06-08 DIAGNOSIS — I10 Essential (primary) hypertension: Secondary | ICD-10-CM | POA: Diagnosis not present

## 2021-06-08 DIAGNOSIS — I2510 Atherosclerotic heart disease of native coronary artery without angina pectoris: Secondary | ICD-10-CM | POA: Diagnosis not present

## 2021-06-08 DIAGNOSIS — I69334 Monoplegia of upper limb following cerebral infarction affecting left non-dominant side: Secondary | ICD-10-CM | POA: Diagnosis not present

## 2021-06-08 DIAGNOSIS — G629 Polyneuropathy, unspecified: Secondary | ICD-10-CM | POA: Diagnosis not present

## 2021-06-08 DIAGNOSIS — R410 Disorientation, unspecified: Secondary | ICD-10-CM | POA: Diagnosis not present

## 2021-06-08 DIAGNOSIS — M48062 Spinal stenosis, lumbar region with neurogenic claudication: Secondary | ICD-10-CM | POA: Diagnosis not present

## 2021-06-09 DIAGNOSIS — J90 Pleural effusion, not elsewhere classified: Secondary | ICD-10-CM | POA: Diagnosis not present

## 2021-06-09 DIAGNOSIS — M549 Dorsalgia, unspecified: Secondary | ICD-10-CM | POA: Diagnosis not present

## 2021-06-09 DIAGNOSIS — I679 Cerebrovascular disease, unspecified: Secondary | ICD-10-CM | POA: Diagnosis not present

## 2021-06-09 DIAGNOSIS — R600 Localized edema: Secondary | ICD-10-CM | POA: Diagnosis not present

## 2021-06-10 ENCOUNTER — Telehealth: Payer: Self-pay | Admitting: Gastroenterology

## 2021-06-10 ENCOUNTER — Telehealth: Payer: Self-pay | Admitting: Interventional Cardiology

## 2021-06-10 DIAGNOSIS — I2510 Atherosclerotic heart disease of native coronary artery without angina pectoris: Secondary | ICD-10-CM | POA: Diagnosis not present

## 2021-06-10 DIAGNOSIS — G629 Polyneuropathy, unspecified: Secondary | ICD-10-CM | POA: Diagnosis not present

## 2021-06-10 DIAGNOSIS — I69334 Monoplegia of upper limb following cerebral infarction affecting left non-dominant side: Secondary | ICD-10-CM | POA: Diagnosis not present

## 2021-06-10 DIAGNOSIS — R410 Disorientation, unspecified: Secondary | ICD-10-CM | POA: Diagnosis not present

## 2021-06-10 DIAGNOSIS — I10 Essential (primary) hypertension: Secondary | ICD-10-CM | POA: Diagnosis not present

## 2021-06-10 DIAGNOSIS — M48062 Spinal stenosis, lumbar region with neurogenic claudication: Secondary | ICD-10-CM | POA: Diagnosis not present

## 2021-06-10 NOTE — Telephone Encounter (Signed)
Inbound call from patient. States she need to cancel procedure for 1/25 due to her having a mini stoke. She states she will call back to reschedule.

## 2021-06-10 NOTE — Telephone Encounter (Signed)
Procedure has been canceled per pt request. Will await her returned call to reschedule procedure once she feels stable enough to proceed.

## 2021-06-10 NOTE — Telephone Encounter (Signed)
Patient is not sure who called her. Patient stated she thinks it might be about her monitor. Will forward to monitor Tech and Dr. Thompson Caul nurse.

## 2021-06-10 NOTE — Telephone Encounter (Signed)
Spoke with pt and she states someone left her a message but they were soft spoken and she couldn't catch the name of reason they were calling.  Advised I am unsure either.  Inquired about monitor.  Pt states she has been wearing it for about 2 weeks now.  Is due to take it off on 07/02/21.  Advised pt to continue wearing monitor and we will be in touch if anything shows up on it before she takes it off.  Pt appreciative for call. Will send message to monitor techs to have them check and make sure it is activated.

## 2021-06-10 NOTE — Telephone Encounter (Signed)
PT IS RETURNING CALL BACK TO TRIAGE

## 2021-06-14 DIAGNOSIS — I69334 Monoplegia of upper limb following cerebral infarction affecting left non-dominant side: Secondary | ICD-10-CM | POA: Diagnosis not present

## 2021-06-14 DIAGNOSIS — M48062 Spinal stenosis, lumbar region with neurogenic claudication: Secondary | ICD-10-CM | POA: Diagnosis not present

## 2021-06-14 DIAGNOSIS — I2510 Atherosclerotic heart disease of native coronary artery without angina pectoris: Secondary | ICD-10-CM | POA: Diagnosis not present

## 2021-06-14 DIAGNOSIS — R410 Disorientation, unspecified: Secondary | ICD-10-CM | POA: Diagnosis not present

## 2021-06-14 DIAGNOSIS — G629 Polyneuropathy, unspecified: Secondary | ICD-10-CM | POA: Diagnosis not present

## 2021-06-14 DIAGNOSIS — I10 Essential (primary) hypertension: Secondary | ICD-10-CM | POA: Diagnosis not present

## 2021-06-21 DIAGNOSIS — G629 Polyneuropathy, unspecified: Secondary | ICD-10-CM | POA: Diagnosis not present

## 2021-06-21 DIAGNOSIS — I2510 Atherosclerotic heart disease of native coronary artery without angina pectoris: Secondary | ICD-10-CM | POA: Diagnosis not present

## 2021-06-21 DIAGNOSIS — I10 Essential (primary) hypertension: Secondary | ICD-10-CM | POA: Diagnosis not present

## 2021-06-21 DIAGNOSIS — M48062 Spinal stenosis, lumbar region with neurogenic claudication: Secondary | ICD-10-CM | POA: Diagnosis not present

## 2021-06-21 DIAGNOSIS — R410 Disorientation, unspecified: Secondary | ICD-10-CM | POA: Diagnosis not present

## 2021-06-21 DIAGNOSIS — I69334 Monoplegia of upper limb following cerebral infarction affecting left non-dominant side: Secondary | ICD-10-CM | POA: Diagnosis not present

## 2021-06-23 ENCOUNTER — Encounter (HOSPITAL_COMMUNITY): Payer: Self-pay

## 2021-06-23 ENCOUNTER — Ambulatory Visit (HOSPITAL_COMMUNITY): Admit: 2021-06-23 | Payer: Medicare Other | Admitting: Gastroenterology

## 2021-06-23 DIAGNOSIS — M810 Age-related osteoporosis without current pathological fracture: Secondary | ICD-10-CM | POA: Diagnosis not present

## 2021-06-23 DIAGNOSIS — I10 Essential (primary) hypertension: Secondary | ICD-10-CM | POA: Diagnosis not present

## 2021-06-23 DIAGNOSIS — Z9181 History of falling: Secondary | ICD-10-CM | POA: Diagnosis not present

## 2021-06-23 DIAGNOSIS — M1990 Unspecified osteoarthritis, unspecified site: Secondary | ICD-10-CM | POA: Diagnosis not present

## 2021-06-23 DIAGNOSIS — G629 Polyneuropathy, unspecified: Secondary | ICD-10-CM | POA: Diagnosis not present

## 2021-06-23 DIAGNOSIS — E039 Hypothyroidism, unspecified: Secondary | ICD-10-CM | POA: Diagnosis not present

## 2021-06-23 DIAGNOSIS — I69334 Monoplegia of upper limb following cerebral infarction affecting left non-dominant side: Secondary | ICD-10-CM | POA: Diagnosis not present

## 2021-06-23 DIAGNOSIS — D472 Monoclonal gammopathy: Secondary | ICD-10-CM | POA: Diagnosis not present

## 2021-06-23 DIAGNOSIS — G2581 Restless legs syndrome: Secondary | ICD-10-CM | POA: Diagnosis not present

## 2021-06-23 DIAGNOSIS — F419 Anxiety disorder, unspecified: Secondary | ICD-10-CM | POA: Diagnosis not present

## 2021-06-23 DIAGNOSIS — K219 Gastro-esophageal reflux disease without esophagitis: Secondary | ICD-10-CM | POA: Diagnosis not present

## 2021-06-23 DIAGNOSIS — E876 Hypokalemia: Secondary | ICD-10-CM | POA: Diagnosis not present

## 2021-06-23 DIAGNOSIS — E785 Hyperlipidemia, unspecified: Secondary | ICD-10-CM | POA: Diagnosis not present

## 2021-06-23 DIAGNOSIS — E871 Hypo-osmolality and hyponatremia: Secondary | ICD-10-CM | POA: Diagnosis not present

## 2021-06-23 DIAGNOSIS — R410 Disorientation, unspecified: Secondary | ICD-10-CM | POA: Diagnosis not present

## 2021-06-23 DIAGNOSIS — M48062 Spinal stenosis, lumbar region with neurogenic claudication: Secondary | ICD-10-CM | POA: Diagnosis not present

## 2021-06-23 DIAGNOSIS — Z7902 Long term (current) use of antithrombotics/antiplatelets: Secondary | ICD-10-CM | POA: Diagnosis not present

## 2021-06-23 DIAGNOSIS — I2510 Atherosclerotic heart disease of native coronary artery without angina pectoris: Secondary | ICD-10-CM | POA: Diagnosis not present

## 2021-06-23 SURGERY — MANOMETRY, ANORECTAL

## 2021-06-27 DIAGNOSIS — M810 Age-related osteoporosis without current pathological fracture: Secondary | ICD-10-CM | POA: Diagnosis not present

## 2021-06-27 DIAGNOSIS — I10 Essential (primary) hypertension: Secondary | ICD-10-CM | POA: Diagnosis not present

## 2021-06-27 DIAGNOSIS — E785 Hyperlipidemia, unspecified: Secondary | ICD-10-CM | POA: Diagnosis not present

## 2021-06-27 DIAGNOSIS — I129 Hypertensive chronic kidney disease with stage 1 through stage 4 chronic kidney disease, or unspecified chronic kidney disease: Secondary | ICD-10-CM | POA: Diagnosis not present

## 2021-06-30 DIAGNOSIS — M48062 Spinal stenosis, lumbar region with neurogenic claudication: Secondary | ICD-10-CM | POA: Diagnosis not present

## 2021-06-30 DIAGNOSIS — I69334 Monoplegia of upper limb following cerebral infarction affecting left non-dominant side: Secondary | ICD-10-CM | POA: Diagnosis not present

## 2021-06-30 DIAGNOSIS — R410 Disorientation, unspecified: Secondary | ICD-10-CM | POA: Diagnosis not present

## 2021-06-30 DIAGNOSIS — I10 Essential (primary) hypertension: Secondary | ICD-10-CM | POA: Diagnosis not present

## 2021-06-30 DIAGNOSIS — I2510 Atherosclerotic heart disease of native coronary artery without angina pectoris: Secondary | ICD-10-CM | POA: Diagnosis not present

## 2021-06-30 DIAGNOSIS — G629 Polyneuropathy, unspecified: Secondary | ICD-10-CM | POA: Diagnosis not present

## 2021-07-05 ENCOUNTER — Telehealth: Payer: Self-pay | Admitting: Interventional Cardiology

## 2021-07-05 NOTE — Telephone Encounter (Signed)
Called pt who verified that she did wear the monitor for 30 days and it was mailed back Friday 2/3. Pt has f/u appt scheduled with Kathlen Mody, PA-C tomorrow. Assured pt that the final report would not have resulted from [facility] yet as it takes approx 1 week, but that we can access the readings over the 30 days via our device clinic here.

## 2021-07-05 NOTE — Progress Notes (Signed)
Cardiology Office Note:    Date:  07/06/2021   ID:  [name], DOB 12-31-1933, MRN [id number]  PCP:  Burnard Bunting, MD  [facility] Providers Cardiologist:  Sinclair Grooms, MD     Referring MD: Burnard Bunting, MD   Chief Complaint:  Hospitalization Follow-up (S/p CVA; f/u on event monitor)    Patient Profile: Non-obstructive coronary artery disease  CVA in 11/22  Hypertension  Hyperlipidemia  GERD  MGUS   Prior CV Studies: Echocardiogram 04/21/21 EF 60-65, no RWMA, Gr 1 DD, normal RVSF, trivial MR, trivial AI  Event monitor 07/02/2018 NSR, rare PACs, rare PVCs; no sustained arrhythmias or AF  Myoview 03/30/15 EF 83, no ischemia; low risk  CCTA 10/07/2006 CAC score 1 LAD < 30% soft plaque    History of Present Illness:   [name] is a 88 y.o. female with the above problem list.  She was last seen by Dr. Tamala Julian in 10/22.  She was admitted 11/21-11/24 with acute CVA.  MRI demonstrated multiple areas involved concerning for embolic infarct.  EF was normal on Echocardiogram.  She was placed on ASA and Plavix for 3 weeks, then Plavix alone.  She was set up for an OP event monitor which has not been resulted yet.  She returns for f/u.    Unfortunately, she fell and broke several ribs on the left after discharge in the hospital.  Therefore, there was a delay in getting her event monitor placed.  Event monitor was just finished recently.  I reviewed what is available today.  No atrial fibrillation was identified.  There was one rapid rhythm that was brief and was likely SVT or atrial tachycardia.  She has not had chest pain.  She has chronic shortness of breath with exertion.  This is unchanged.  She did develop a left pleural effusion after she broke her ribs.  She saw primary care and thoracentesis was arranged.  She had bloody pleural fluid removed.  She has felt good since.  She has not had orthopnea, significant leg edema, syncope.    Past Medical History:   Diagnosis Date   Anxiety    Arthritis    Chronic headaches    Colon polyps    Complication of anesthesia    Elevated blood pressure after having last Kyphoplasty; pt stated "I was given Morphine and had to stay overnight"   Coronary artery disease    DI (detrusor instability)    Fracture of vertebra    x 3   GERD (gastroesophageal reflux disease)    History of hiatal hernia    HLD (hyperlipidemia)    Hypertension    Hypothyroidism    Menopausal symptoms    MGUS (monoclonal gammopathy of unknown significance) 12/27/2017   IgA   Osteoporosis    Peripheral neuropathy 12/27/2017   Peripheral neuropathy    Pneumonia    Restless leg syndrome    Varicose veins    Current Medications: Current Meds  Medication Sig   ALPRAZolam (XANAX) 0.5 MG tablet Take 0.25 mg by mouth at bedtime as needed (restless legs).   amLODipine (NORVASC) 2.5 MG tablet Take 2.5 mg by mouth 2 (two) times daily.   amoxicillin (AMOXIL) 500 MG capsule Take 2,000 mg by mouth as needed (dental). Take 4 capsules (2000 mg) by mouth one hour prior to dental appointments   Ascorbic Acid (VITAMIN C PO) Take 1 tablet by mouth daily.   Calcium Carbonate-Vitamin D (CALCIUM-D PO) Take 1 tablet by  mouth daily.   clopidogrel (PLAVIX) 75 MG tablet Take 1 tablet (75 mg total) by mouth daily.   dexlansoprazole (DEXILANT) 60 MG capsule Take 60 mg by mouth every morning.   escitalopram (LEXAPRO) 10 MG tablet Take 10 mg by mouth at bedtime.   levothyroxine (SYNTHROID, LEVOTHROID) 25 MCG tablet Take 25 mcg by mouth daily before breakfast.    metoprolol succinate (TOPROL-XL) 25 MG 24 hr tablet Take 25 mg by mouth every morning.   Multiple Vitamin (MULTIVITAMIN WITH MINERALS) TABS tablet Take 1 tablet by mouth daily.   NONFORMULARY OR COMPOUNDED ITEM Apply 1 application topically See admin instructions. Transdermal therapeutics  meloxicam 0.5%, doxepin 3%, amantadine 3%, dextromethorphan 2%, lidocaine 2%. - compounded at [place]:  Apply topically to legs daily after showering for neuropathy   Polyvinyl Alcohol-Povidone (REFRESH OP) Place 1 drop into both eyes daily as needed (dry eyes).   rosuvastatin (CRESTOR) 20 MG tablet Take 1 tablet (20 mg total) by mouth daily.   triamterene-hydrochlorothiazide (MAXZIDE-25) 37.5-25 MG tablet Take 1 tablet by mouth every morning. Hold this medication for now, please see your pcp for hospital discharge follow up, repeat sodium level, please discussion resumption or discontinuation this meds   Vitamin D, Ergocalciferol, (DRISDOL) 50000 UNITS CAPS Take 50,000 Units by mouth every Friday.   VITAMIN E PO Take 1 capsule by mouth daily.   Wheat Dextrin (BENEFIBER) POWD Take 1 daily dose   zolpidem (AMBIEN CR) 12.5 MG CR tablet Take 12.5 mg by mouth at bedtime.    Allergies:   Patient has no known allergies.   Social History   Tobacco Use   Smoking status: Never   Smokeless tobacco: Never  Vaping Use   Vaping Use: Never used  Substance Use Topics   Alcohol use: No   Drug use: No    Family Hx: The patient's family history includes Breast cancer in her niece; Diabetes in her sister and sister; Heart disease in her brother, brother, brother, brother, brother, brother, father, mother, and son; Hyperlipidemia in her daughter, sister, sister, and son; Hypertension in her brother, brother, daughter, sister, sister, and sister; Ovarian cancer in her sister; Rectal cancer in her sister; Stroke in her father.  Review of Systems  Gastrointestinal:  Negative for hematochezia.  Genitourinary:  Negative for hematuria.    EKGs/Labs/Other Test Reviewed:    EKG:  EKG is   ordered today.  The ekg ordered today demonstrates NSR, HR 78, left axis deviation, frequent PVCs, no ST-T wave changes, septal Q waves, QTc 456, no change from prior tracing  Recent Labs: 03/11/2021: ALT 20 04/20/2021: Hemoglobin 13.8; Platelets 322 04/21/2021: TSH 1.522 04/22/2021: BUN 19; Creatinine, Ser 0.73;  Magnesium 2.2; Potassium 3.5; Sodium 131   Recent Lipid Panel Recent Labs    04/21/21 0350  CHOL 178  TRIG 79  HDL 89  VLDL 16  LDLCALC 73     Risk Assessment/Calculations:         Physical Exam:    VS:  BP 122/72 (BP Location: Right Arm, Patient Position: Sitting, Cuff Size: Normal)    Pulse 75    Ht 5\' 1"  (1.549 m)    Wt 134 lb (60.8 kg)    SpO2 97%    BMI 25.32 kg/m     Wt Readings from Last 3 Encounters:  07/06/21 134 lb (60.8 kg)  04/20/21 138 lb 3.7 oz (62.7 kg)  03/26/21 137 lb 6 oz (62.3 kg)    Constitutional:  Appearance: Healthy appearance. Not in distress.  Neck:     Vascular: JVD normal.  Pulmonary:     Effort: Pulmonary effort is normal.     Breath sounds: No wheezing. No rales.  Cardiovascular:     Normal rate. Regular rhythm. Normal S1. Normal S2.      Murmurs: There is no murmur.  Edema:    Peripheral edema absent.  Abdominal:     Palpations: Abdomen is soft.  Skin:    General: Skin is warm and dry.  Neurological:     Mental Status: Alert and oriented to person, place and time.     Cranial Nerves: Cranial nerves are intact.        ASSESSMENT & PLAN:   History of CVA (cerebrovascular accident) Her MRI in the hospital demonstrated multiple small infarcts in bilateral hemispheres concerning for embolic CVA.  Her event monitor did not demonstrate any evidence of atrial fibrillation.  As her MRI did suggest embolic CVA, she would likely benefit from implantation of a loop recorder.  I reviewed her case today with Dr. Quentin Ore (attending MD) who agreed.  I will refer her to Dr. Quentin Ore for implantation.  I discussed this with the patient and she agrees with this plan.  She remains on clopidogrel 75 mg daily.  We discussed the possibility of changing clopidogrel to warfarin versus a DOAC if we do identify atrial fibrillation.  PVC's (premature ventricular contractions) She had several PVCs noted on her electrocardiogram today.  She currently takes  triamterene/HCTZ.  I will obtain a BMET to rule out electrolyte abnormalities.  Essential hypertension Her blood pressure is well controlled.  Continue amlodipine 2.5 mg twice daily, metoprolol succinate 25 mg daily, triamterene/HCTZ 37.5/25 mg daily.  Hyperlipidemia LDL in November 2022 was 44.  Continue rosuvastatin 20 mg daily.  CAD (coronary artery disease) She has a history of nonobstructive coronary artery disease on coronary CTA in 2008.  She is not having anginal symptoms.  Continue clopidogrel 75 mg daily, rosuvastatin 20 mg daily.  Pleural effusion, left She developed this sometime after she fell and broke her lower ribs on the left.  She had a thoracentesis and has felt fine since.  Her exam today does not suggest reaccumulation.  She notes that her PCP will check repeat chest x-ray in the near future.  This was all likely reactionary to her fractured ribs.           Dispo:  Return in about 6 months (around 01/03/2022) for Routine follow up in 6 months with Dr. Tamala Julian. .   Medication Adjustments/Labs and Tests Ordered: Current medicines are reviewed at length with the patient today.  Concerns regarding medicines are outlined above.  Tests Ordered: Orders Placed This Encounter  Procedures   Basic Metabolic Panel (BMET)   Ambulatory referral to Cardiac Electrophysiology   EKG 12-Lead   Medication Changes: No orders of the defined types were placed in this encounter.  Signed, Richardson Dopp, PA-C  07/06/2021 5:43 PM    [facility] [place], [place], [place]  [address] Phone: [phone]; Fax: [phone]

## 2021-07-05 NOTE — Telephone Encounter (Signed)
Pt is reaching out wanting to know what she should do in regards to her upcoming appt... pt sent monitor in on Friday and doesn't think that the company will receive it in time enough for her to get her results at her next appt... please advise

## 2021-07-06 ENCOUNTER — Other Ambulatory Visit: Payer: Self-pay

## 2021-07-06 ENCOUNTER — Ambulatory Visit (INDEPENDENT_AMBULATORY_CARE_PROVIDER_SITE_OTHER): Payer: Medicare Other | Admitting: Physician Assistant

## 2021-07-06 ENCOUNTER — Encounter: Payer: Self-pay | Admitting: Physician Assistant

## 2021-07-06 ENCOUNTER — Other Ambulatory Visit: Payer: Self-pay | Admitting: Physician Assistant

## 2021-07-06 VITALS — BP 122/72 | HR 75 | Ht 61.0 in | Wt 134.0 lb

## 2021-07-06 DIAGNOSIS — I10 Essential (primary) hypertension: Secondary | ICD-10-CM | POA: Diagnosis not present

## 2021-07-06 DIAGNOSIS — I639 Cerebral infarction, unspecified: Secondary | ICD-10-CM

## 2021-07-06 DIAGNOSIS — J90 Pleural effusion, not elsewhere classified: Secondary | ICD-10-CM | POA: Diagnosis not present

## 2021-07-06 DIAGNOSIS — Z8673 Personal history of transient ischemic attack (TIA), and cerebral infarction without residual deficits: Secondary | ICD-10-CM | POA: Diagnosis not present

## 2021-07-06 DIAGNOSIS — I2510 Atherosclerotic heart disease of native coronary artery without angina pectoris: Secondary | ICD-10-CM | POA: Diagnosis not present

## 2021-07-06 DIAGNOSIS — E782 Mixed hyperlipidemia: Secondary | ICD-10-CM | POA: Diagnosis not present

## 2021-07-06 DIAGNOSIS — I493 Ventricular premature depolarization: Secondary | ICD-10-CM

## 2021-07-06 DIAGNOSIS — I4891 Unspecified atrial fibrillation: Secondary | ICD-10-CM

## 2021-07-06 NOTE — Patient Instructions (Signed)
Medication Instructions:   Your physician recommends that you continue on your current medications as directed. Please refer to the Current Medication list given to you today.   *If you need a refill on your cardiac medications before your next appointment, please call your pharmacy*   Lab Work:  TODAY!!!! BMET  If you have labs (blood work) drawn today and your tests are completely normal, you will receive your results only by: [place] (if you have MyChart) OR A paper copy in the mail If you have any lab test that is abnormal or we need to change your treatment, we will call you to review the results.   Testing/Procedures:  Otila Kluver will be in touch with you about setting up loop recorder.    Follow-Up: At [facility], you and your health needs are our priority.  As part of our continuing mission to provide you with exceptional heart care, we have created designated Provider Care Teams.  These Care Teams include your primary Cardiologist (physician) and Advanced Practice Providers (APPs -  Physician Assistants and Nurse Practitioners) who all work together to provide you with the care you need, when you need it.  We recommend signing up for the patient portal called "MyChart".  Sign up information is provided on this After Visit Summary.  MyChart is used to connect with patients for Virtual Visits (Telemedicine).  Patients are able to view lab/test results, encounter notes, upcoming appointments, etc.  Non-urgent messages can be sent to your provider as well.   To learn more about what you can do with MyChart, go to [email or web address].    Your next appointment:   6 month(s)  The format for your next appointment:   In Person  Provider:   Sinclair Grooms, MD      Other Instructions  Your physician wants you to follow-up in: 6 month with Dr. Tamala Julian.  You will receive a reminder letter in the mail two months in advance. If you don't receive a letter, please call  our office to schedule the follow-up appointment.

## 2021-07-06 NOTE — Assessment & Plan Note (Signed)
LDL in November 2022 was 44.  Continue rosuvastatin 20 mg daily.

## 2021-07-06 NOTE — Assessment & Plan Note (Signed)
Her blood pressure is well controlled.  Continue amlodipine 2.5 mg twice daily, metoprolol succinate 25 mg daily, triamterene/HCTZ 37.5/25 mg daily.

## 2021-07-06 NOTE — Assessment & Plan Note (Signed)
She had several PVCs noted on her electrocardiogram today.  She currently takes triamterene/HCTZ.  I will obtain a BMET to rule out electrolyte abnormalities.

## 2021-07-06 NOTE — Assessment & Plan Note (Signed)
Her MRI in the hospital demonstrated multiple small infarcts in bilateral hemispheres concerning for embolic CVA.  Her event monitor did not demonstrate any evidence of atrial fibrillation.  As her MRI did suggest embolic CVA, she would likely benefit from implantation of a loop recorder.  I reviewed her case today with Dr. Quentin Ore (attending MD) who agreed.  I will refer her to Dr. Quentin Ore for implantation.  I discussed this with the patient and she agrees with this plan.  She remains on clopidogrel 75 mg daily.  We discussed the possibility of changing clopidogrel to warfarin versus a DOAC if we do identify atrial fibrillation.

## 2021-07-06 NOTE — Assessment & Plan Note (Signed)
She developed this sometime after she fell and broke her lower ribs on the left.  She had a thoracentesis and has felt fine since.  Her exam today does not suggest reaccumulation.  She notes that her PCP will check repeat chest x-ray in the near future.  This was all likely reactionary to her fractured ribs.

## 2021-07-06 NOTE — Assessment & Plan Note (Signed)
She has a history of nonobstructive coronary artery disease on coronary CTA in 2008.  She is not having anginal symptoms.  Continue clopidogrel 75 mg daily, rosuvastatin 20 mg daily.

## 2021-07-07 LAB — BASIC METABOLIC PANEL
BUN/Creatinine Ratio: 17 (ref 12–28)
BUN: 14 mg/dL (ref 8–27)
CO2: 27 mmol/L (ref 20–29)
Calcium: 10.2 mg/dL (ref 8.7–10.3)
Chloride: 93 mmol/L — ABNORMAL LOW (ref 96–106)
Creatinine, Ser: 0.84 mg/dL (ref 0.57–1.00)
Glucose: 96 mg/dL (ref 70–99)
Potassium: 4.1 mmol/L (ref 3.5–5.2)
Sodium: 133 mmol/L — ABNORMAL LOW (ref 134–144)
eGFR: 68 mL/min/{1.73_m2} (ref >59)

## 2021-07-08 ENCOUNTER — Inpatient Hospital Stay: Payer: Medicare Other | Admitting: Neurology

## 2021-07-08 DIAGNOSIS — I69334 Monoplegia of upper limb following cerebral infarction affecting left non-dominant side: Secondary | ICD-10-CM | POA: Diagnosis not present

## 2021-07-08 DIAGNOSIS — R059 Cough, unspecified: Secondary | ICD-10-CM | POA: Diagnosis not present

## 2021-07-08 DIAGNOSIS — G629 Polyneuropathy, unspecified: Secondary | ICD-10-CM | POA: Diagnosis not present

## 2021-07-08 DIAGNOSIS — M48062 Spinal stenosis, lumbar region with neurogenic claudication: Secondary | ICD-10-CM | POA: Diagnosis not present

## 2021-07-08 DIAGNOSIS — I10 Essential (primary) hypertension: Secondary | ICD-10-CM | POA: Diagnosis not present

## 2021-07-08 DIAGNOSIS — I2510 Atherosclerotic heart disease of native coronary artery without angina pectoris: Secondary | ICD-10-CM | POA: Diagnosis not present

## 2021-07-08 DIAGNOSIS — R410 Disorientation, unspecified: Secondary | ICD-10-CM | POA: Diagnosis not present

## 2021-07-15 DIAGNOSIS — G629 Polyneuropathy, unspecified: Secondary | ICD-10-CM | POA: Diagnosis not present

## 2021-07-15 DIAGNOSIS — I69334 Monoplegia of upper limb following cerebral infarction affecting left non-dominant side: Secondary | ICD-10-CM | POA: Diagnosis not present

## 2021-07-15 DIAGNOSIS — R410 Disorientation, unspecified: Secondary | ICD-10-CM | POA: Diagnosis not present

## 2021-07-15 DIAGNOSIS — M48062 Spinal stenosis, lumbar region with neurogenic claudication: Secondary | ICD-10-CM | POA: Diagnosis not present

## 2021-07-15 DIAGNOSIS — I2510 Atherosclerotic heart disease of native coronary artery without angina pectoris: Secondary | ICD-10-CM | POA: Diagnosis not present

## 2021-07-15 DIAGNOSIS — I10 Essential (primary) hypertension: Secondary | ICD-10-CM | POA: Diagnosis not present

## 2021-07-20 ENCOUNTER — Inpatient Hospital Stay: Payer: Medicare Other | Admitting: Neurology

## 2021-07-20 DIAGNOSIS — I2510 Atherosclerotic heart disease of native coronary artery without angina pectoris: Secondary | ICD-10-CM | POA: Diagnosis not present

## 2021-07-20 DIAGNOSIS — G629 Polyneuropathy, unspecified: Secondary | ICD-10-CM | POA: Diagnosis not present

## 2021-07-20 DIAGNOSIS — I10 Essential (primary) hypertension: Secondary | ICD-10-CM | POA: Diagnosis not present

## 2021-07-20 DIAGNOSIS — M48062 Spinal stenosis, lumbar region with neurogenic claudication: Secondary | ICD-10-CM | POA: Diagnosis not present

## 2021-07-20 DIAGNOSIS — I69334 Monoplegia of upper limb following cerebral infarction affecting left non-dominant side: Secondary | ICD-10-CM | POA: Diagnosis not present

## 2021-07-20 DIAGNOSIS — R410 Disorientation, unspecified: Secondary | ICD-10-CM | POA: Diagnosis not present

## 2021-07-22 ENCOUNTER — Encounter: Payer: Self-pay | Admitting: Hematology & Oncology

## 2021-07-22 ENCOUNTER — Inpatient Hospital Stay: Payer: Medicare Other | Attending: Hematology & Oncology

## 2021-07-22 ENCOUNTER — Other Ambulatory Visit: Payer: Self-pay

## 2021-07-22 ENCOUNTER — Telehealth: Payer: Self-pay | Admitting: *Deleted

## 2021-07-22 ENCOUNTER — Inpatient Hospital Stay (HOSPITAL_BASED_OUTPATIENT_CLINIC_OR_DEPARTMENT_OTHER): Payer: Medicare Other | Admitting: Hematology & Oncology

## 2021-07-22 ENCOUNTER — Other Ambulatory Visit: Payer: Self-pay | Admitting: *Deleted

## 2021-07-22 VITALS — BP 115/67 | HR 84 | Resp 18 | Ht 60.0 in | Wt 132.1 lb

## 2021-07-22 DIAGNOSIS — D472 Monoclonal gammopathy: Secondary | ICD-10-CM

## 2021-07-22 DIAGNOSIS — M791 Myalgia, unspecified site: Secondary | ICD-10-CM | POA: Diagnosis not present

## 2021-07-22 DIAGNOSIS — E7800 Pure hypercholesterolemia, unspecified: Secondary | ICD-10-CM

## 2021-07-22 DIAGNOSIS — Z7902 Long term (current) use of antithrombotics/antiplatelets: Secondary | ICD-10-CM | POA: Insufficient documentation

## 2021-07-22 DIAGNOSIS — E782 Mixed hyperlipidemia: Secondary | ICD-10-CM

## 2021-07-22 DIAGNOSIS — R519 Headache, unspecified: Secondary | ICD-10-CM | POA: Insufficient documentation

## 2021-07-22 DIAGNOSIS — Z8679 Personal history of other diseases of the circulatory system: Secondary | ICD-10-CM | POA: Diagnosis not present

## 2021-07-22 DIAGNOSIS — Z79899 Other long term (current) drug therapy: Secondary | ICD-10-CM | POA: Insufficient documentation

## 2021-07-22 LAB — CBC WITH DIFFERENTIAL (CANCER CENTER ONLY)
Abs Immature Granulocytes: 0.02 10*3/uL (ref 0.00–0.07)
Basophils Absolute: 0 10*3/uL (ref 0.0–0.1)
Basophils Relative: 0 %
Eosinophils Absolute: 0 10*3/uL (ref 0.0–0.5)
Eosinophils Relative: 1 %
HCT: 42.3 % (ref 36.0–46.0)
Hemoglobin: 14.1 g/dL (ref 12.0–15.0)
Immature Granulocytes: 0 %
Lymphocytes Relative: 33 %
Lymphs Abs: 1.6 10*3/uL (ref 0.7–4.0)
MCH: 30.9 pg (ref 26.0–34.0)
MCHC: 33.3 g/dL (ref 30.0–36.0)
MCV: 92.8 fL (ref 80.0–100.0)
Monocytes Absolute: 0.6 10*3/uL (ref 0.1–1.0)
Monocytes Relative: 12 %
Neutro Abs: 2.7 10*3/uL (ref 1.7–7.7)
Neutrophils Relative %: 54 %
Platelet Count: 319 10*3/uL (ref 150–400)
RBC: 4.56 MIL/uL (ref 3.87–5.11)
RDW: 13.5 % (ref 11.5–15.5)
WBC Count: 4.9 10*3/uL (ref 4.0–10.5)
nRBC: 0 % (ref 0.0–0.2)

## 2021-07-22 LAB — CMP (CANCER CENTER ONLY)
ALT: 16 U/L (ref 0–44)
AST: 20 U/L (ref 15–41)
Albumin: 4.2 g/dL (ref 3.5–5.0)
Alkaline Phosphatase: 81 U/L (ref 38–126)
Anion gap: 7 (ref 5–15)
BUN: 15 mg/dL (ref 8–23)
CO2: 30 mmol/L (ref 22–32)
Calcium: 10 mg/dL (ref 8.9–10.3)
Chloride: 97 mmol/L — ABNORMAL LOW (ref 98–111)
Creatinine: 0.85 mg/dL (ref 0.44–1.00)
GFR, Estimated: >60 mL/min (ref >60)
Glucose, Bld: 93 mg/dL (ref 70–99)
Potassium: 3.7 mmol/L (ref 3.5–5.1)
Sodium: 134 mmol/L — ABNORMAL LOW (ref 135–145)
Total Bilirubin: 0.5 mg/dL (ref 0.3–1.2)
Total Protein: 7.2 g/dL (ref 6.5–8.1)

## 2021-07-22 LAB — LIPID PANEL
Cholesterol: 172 mg/dL (ref 0–200)
HDL: 89 mg/dL (ref >40)
LDL Cholesterol: 63 mg/dL (ref 0–99)
Total CHOL/HDL Ratio: 1.9 RATIO
Triglycerides: 99 mg/dL (ref <150)
VLDL: 20 mg/dL (ref 0–40)

## 2021-07-22 LAB — LACTATE DEHYDROGENASE: LDH: 163 U/L (ref 98–192)

## 2021-07-22 NOTE — Telephone Encounter (Signed)
As noted below by Dr. Marin Olp, I informed the patient that the cholesterol is fantastic! You have a high amount of good cholesterol, HDL, and that should be helpful. Labs faxed electronically to her PCP. She verbalized understanding.

## 2021-07-22 NOTE — Telephone Encounter (Signed)
Call received from patient requesting a lipid panel to be drawn today per recommendation of her PCP d/t recent mini strokes.  Dr. Marin Olp notified and order placed.

## 2021-07-22 NOTE — Telephone Encounter (Signed)
-----   Message from [name], MD sent at 07/22/2021 12:48 PM EST ----- Call - the cholesterol is fantastic.  You have a very high amount of good cholesterol - HDL - that really should be helpful!!!    Please make sure that her family MD gets this result!!  [name]

## 2021-07-22 NOTE — Progress Notes (Signed)
Hematology and Oncology Follow Up Visit  [name] [id number] 03-11-1934 88 y.o. 07/22/2021   Principle Diagnosis:  IgA Kappa MGUS  Current Therapy:   Observation     Interim History:  [name] is back for follow-up.  Unfortunately, since we last saw her, she actually is having strokes.  She had a MRI done right before Thanksgiving.   Looks like she had an infarct in the right cerebral hemisphere.  I am unsure as to what the etiology of this might be.  I think that she is on Plavix right now.  She also is on Crestor.  She looks and sounds good.  I really do not see any kind of neurological issues.  I do not think anything is related to this IgA kappa MGUS.  When we last saw her, the M spike was 0.2 g/dL.  The IgA level was 377 mg/dL.  The Kappa light chain was 3.7mg /dL.  She has had some back issues.  She sees Dr. Ellene Route of Neurosurgery for this.  I think she is going to see him tomorrow.  Is been no change in bowel or bladder habits.  She has had no incontinence.  Overall, I would say her performance status right now is ECOG 1.      Medications:  Current Outpatient Medications:    ALPRAZolam (XANAX) 0.5 MG tablet, Take 0.25 mg by mouth at bedtime as needed (restless legs)., Disp: , Rfl:    amLODipine (NORVASC) 2.5 MG tablet, Take 2.5 mg by mouth 2 (two) times daily., Disp: , Rfl:    amoxicillin (AMOXIL) 500 MG capsule, Take 2,000 mg by mouth as needed (dental). Take 4 capsules (2000 mg) by mouth one hour prior to dental appointments, Disp: , Rfl:    Ascorbic Acid (VITAMIN C PO), Take 1 tablet by mouth daily., Disp: , Rfl:    Calcium Carbonate-Vitamin D (CALCIUM-D PO), Take 1 tablet by mouth daily., Disp: , Rfl:    clopidogrel (PLAVIX) 75 MG tablet, Take 1 tablet (75 mg total) by mouth daily., Disp: 30 tablet, Rfl: 3   dexlansoprazole (DEXILANT) 60 MG capsule, Take 60 mg by mouth every morning., Disp: , Rfl:    escitalopram (LEXAPRO) 10 MG tablet, Take 10 mg by mouth at  bedtime., Disp: , Rfl:    levothyroxine (SYNTHROID, LEVOTHROID) 25 MCG tablet, Take 25 mcg by mouth daily before breakfast. , Disp: , Rfl:    metoprolol succinate (TOPROL-XL) 25 MG 24 hr tablet, Take 25 mg by mouth every morning., Disp: , Rfl:    Multiple Vitamin (MULTIVITAMIN WITH MINERALS) TABS tablet, Take 1 tablet by mouth daily., Disp: , Rfl:    NONFORMULARY OR COMPOUNDED ITEM, Apply 1 application topically See admin instructions. Transdermal therapeutics  meloxicam 0.5%, doxepin 3%, amantadine 3%, dextromethorphan 2%, lidocaine 2%. - compounded at [place]:  Apply topically to legs daily after showering for neuropathy, Disp: , Rfl:    Polyvinyl Alcohol-Povidone (REFRESH OP), Place 1 drop into both eyes daily as needed (dry eyes)., Disp: , Rfl:    rosuvastatin (CRESTOR) 20 MG tablet, Take 1 tablet (20 mg total) by mouth daily., Disp: 30 tablet, Rfl: 0   triamterene-hydrochlorothiazide (MAXZIDE-25) 37.5-25 MG tablet, Take 1 tablet by mouth every morning. Hold this medication for now, please see your pcp for hospital discharge follow up, repeat sodium level, please discussion resumption or discontinuation this meds, Disp: , Rfl:    Vitamin D, Ergocalciferol, (DRISDOL) 50000 UNITS CAPS, Take 50,000 Units by mouth every Friday.,  Disp: , Rfl:    VITAMIN E PO, Take 1 capsule by mouth daily., Disp: , Rfl:    Wheat Dextrin (BENEFIBER) POWD, Take 1 daily dose, Disp: 500 g, Rfl: 0   zolpidem (AMBIEN CR) 12.5 MG CR tablet, Take 12.5 mg by mouth at bedtime., Disp: , Rfl:   Allergies: No Known Allergies  Past Medical History, Surgical history, Social history, and Family History were reviewed and updated.  Review of Systems: Review of Systems  Constitutional: Negative.  Negative for appetite change, fatigue, fever and unexpected weight change.  HENT:  Negative.  Negative for lump/mass, mouth sores, sore throat and trouble swallowing.   Eyes: Negative.   Respiratory: Negative.  Negative for  cough, hemoptysis and shortness of breath.   Cardiovascular: Negative.  Negative for leg swelling and palpitations.  Gastrointestinal: Negative.  Negative for abdominal distention, abdominal pain, blood in stool, constipation, diarrhea, nausea and vomiting.  Endocrine: Negative.   Genitourinary: Negative.  Negative for bladder incontinence, dysuria, frequency and hematuria.   Musculoskeletal:  Positive for myalgias. Negative for arthralgias, back pain and gait problem.  Skin: Negative.  Negative for itching and rash.  Neurological:  Positive for headaches. Negative for dizziness, extremity weakness, gait problem, numbness, seizures and speech difficulty.  Hematological: Negative.  Does not bruise/bleed easily.  Psychiatric/Behavioral: Negative.  Negative for depression and sleep disturbance. The patient is not nervous/anxious.    Physical Exam:  vitals were not taken for this visit.   Wt Readings from Last 3 Encounters:  07/06/21 134 lb (60.8 kg)  04/20/21 138 lb 3.7 oz (62.7 kg)  03/26/21 137 lb 6 oz (62.3 kg)    Physical Exam Vitals reviewed.  HENT:     Head: Normocephalic and atraumatic.  Eyes:     Pupils: Pupils are equal, round, and reactive to light.  Cardiovascular:     Rate and Rhythm: Normal rate and regular rhythm.     Heart sounds: Normal heart sounds.  Pulmonary:     Effort: Pulmonary effort is normal.     Breath sounds: Normal breath sounds.  Abdominal:     General: Bowel sounds are normal.     Palpations: Abdomen is soft.  Musculoskeletal:        General: No tenderness or deformity. Normal range of motion.     Cervical back: Normal range of motion.  Lymphadenopathy:     Cervical: No cervical adenopathy.  Skin:    General: Skin is warm and dry.     Findings: No erythema or rash.  Neurological:     Mental Status: She is alert and oriented to person, place, and time.  Psychiatric:        Behavior: Behavior normal.        Thought Content: Thought content  normal.        Judgment: Judgment normal.     Lab Results  Component Value Date   WBC 4.9 07/22/2021   HGB 14.1 07/22/2021   HCT 42.3 07/22/2021   MCV 92.8 07/22/2021   PLT 319 07/22/2021     Chemistry      Component Value Date/Time   NA 134 (L) 07/22/2021 0934   NA 133 (L) 07/06/2021 1528   K 3.7 07/22/2021 0934   CL 97 (L) 07/22/2021 0934   CO2 30 07/22/2021 0934   BUN 15 07/22/2021 0934   BUN 14 07/06/2021 1528   CREATININE 0.85 07/22/2021 0934      Component Value Date/Time   CALCIUM 10.0 07/22/2021 0934  ALKPHOS 81 07/22/2021 0934   AST 20 07/22/2021 0934   ALT 16 07/22/2021 0934   BILITOT 0.5 07/22/2021 0934      Impression and Plan: Ms. Lopata is a 88 year old white female.  She has an IgA kappa MGUS.  This is still at a very low level.  This certainly could be from her age alone.  I forgot to mention that she also had a large bloody pleural effusion taken out from the left lung.  This was right after Christmas.  I think she fell.  1.2 L of fluid was removed.  No cytology was sent off as it was bloody.  I still think we can get her back in 4 months.  I would be surprised if there was any change in the monoclonal protein levels.    [name], MD 2/23/202310:39 AM

## 2021-07-23 DIAGNOSIS — D472 Monoclonal gammopathy: Secondary | ICD-10-CM | POA: Diagnosis not present

## 2021-07-23 DIAGNOSIS — F419 Anxiety disorder, unspecified: Secondary | ICD-10-CM | POA: Diagnosis not present

## 2021-07-23 DIAGNOSIS — M48062 Spinal stenosis, lumbar region with neurogenic claudication: Secondary | ICD-10-CM | POA: Diagnosis not present

## 2021-07-23 DIAGNOSIS — I2510 Atherosclerotic heart disease of native coronary artery without angina pectoris: Secondary | ICD-10-CM | POA: Diagnosis not present

## 2021-07-23 DIAGNOSIS — S22080A Wedge compression fracture of T11-T12 vertebra, initial encounter for closed fracture: Secondary | ICD-10-CM | POA: Diagnosis not present

## 2021-07-23 DIAGNOSIS — M810 Age-related osteoporosis without current pathological fracture: Secondary | ICD-10-CM | POA: Diagnosis not present

## 2021-07-23 DIAGNOSIS — K219 Gastro-esophageal reflux disease without esophagitis: Secondary | ICD-10-CM | POA: Diagnosis not present

## 2021-07-23 DIAGNOSIS — E785 Hyperlipidemia, unspecified: Secondary | ICD-10-CM | POA: Diagnosis not present

## 2021-07-23 DIAGNOSIS — I69334 Monoplegia of upper limb following cerebral infarction affecting left non-dominant side: Secondary | ICD-10-CM | POA: Diagnosis not present

## 2021-07-23 DIAGNOSIS — Z7902 Long term (current) use of antithrombotics/antiplatelets: Secondary | ICD-10-CM | POA: Diagnosis not present

## 2021-07-23 DIAGNOSIS — G629 Polyneuropathy, unspecified: Secondary | ICD-10-CM | POA: Diagnosis not present

## 2021-07-23 DIAGNOSIS — E876 Hypokalemia: Secondary | ICD-10-CM | POA: Diagnosis not present

## 2021-07-23 DIAGNOSIS — M5416 Radiculopathy, lumbar region: Secondary | ICD-10-CM | POA: Diagnosis not present

## 2021-07-23 DIAGNOSIS — I10 Essential (primary) hypertension: Secondary | ICD-10-CM | POA: Diagnosis not present

## 2021-07-23 DIAGNOSIS — Z9181 History of falling: Secondary | ICD-10-CM | POA: Diagnosis not present

## 2021-07-23 DIAGNOSIS — E871 Hypo-osmolality and hyponatremia: Secondary | ICD-10-CM | POA: Diagnosis not present

## 2021-07-23 DIAGNOSIS — E039 Hypothyroidism, unspecified: Secondary | ICD-10-CM | POA: Diagnosis not present

## 2021-07-23 DIAGNOSIS — G2581 Restless legs syndrome: Secondary | ICD-10-CM | POA: Diagnosis not present

## 2021-07-23 DIAGNOSIS — M1990 Unspecified osteoarthritis, unspecified site: Secondary | ICD-10-CM | POA: Diagnosis not present

## 2021-07-23 DIAGNOSIS — R410 Disorientation, unspecified: Secondary | ICD-10-CM | POA: Diagnosis not present

## 2021-07-23 LAB — KAPPA/LAMBDA LIGHT CHAINS
Kappa free light chain: 36.2 mg/L — ABNORMAL HIGH (ref 3.3–19.4)
Kappa, lambda light chain ratio: 1.87 — ABNORMAL HIGH (ref 0.26–1.65)
Lambda free light chains: 19.4 mg/L (ref 5.7–26.3)

## 2021-07-23 LAB — IGG, IGA, IGM
IgA: 402 mg/dL (ref 64–422)
IgG (Immunoglobin G), Serum: 877 mg/dL (ref 586–1602)
IgM (Immunoglobulin M), Srm: 86 mg/dL (ref 26–217)

## 2021-07-26 ENCOUNTER — Telehealth: Payer: Self-pay | Admitting: *Deleted

## 2021-07-26 ENCOUNTER — Other Ambulatory Visit: Payer: Self-pay | Admitting: Neurological Surgery

## 2021-07-26 NOTE — Telephone Encounter (Signed)
° °  Primary Cardiologist: Sinclair Grooms, MD  Chart reviewed as part of pre-operative protocol coverage. Given past medical history and time since last visit, based on ACC/AHA guidelines, [name] would be at acceptable risk for the planned procedure without further cardiovascular testing.   I spoke with the patient on the phone today and she has no new cardiac issues since she was last seen in the office July 06, 2021.  She denies chest pain, shortness of breath, palpitations/fluttering in her chest.  She meets the METS requirement and has no trouble walking around her house and going up and down flights of stairs.  She did have a fall after her stroke and now is having a T12 kyphoplasty by Dr. Ellene Route.  In regards to holding Plavix, Dr. Clarice Pole office will need to reach out to Dr. Clydene Fake office for clearance (Appointment 3/22).  She was started on Plavix after her CVA in November by neurology.  Patient was advised that if she develops new symptoms prior to surgery to contact our office to arrange a follow-up appointment.  He verbalized understanding.  I will route this recommendation to the requesting party via Epic fax function and remove from pre-op pool.  Please call with questions. Elgie Collard, PA-C  07/26/2021, 11:01 AM

## 2021-07-26 NOTE — Telephone Encounter (Signed)
° °  Pre-operative Risk Assessment    Patient Name: [name]  DOB: 1933-11-12 MRN: [id number]      Request for Surgical Clearance    Procedure:   T12 KYPHOPLASTY  Date of Surgery:  Clearance 08/02/21                                 Surgeon:  DR. Kristeen Miss Surgeon's Group or Practice Name:  [facility] Phone number:  [phone] Fax number:  [phone] ATTN JESSICA   Type of Clearance Requested:   - Medical  - Pharmacy:  Hold Clopidogrel (Plavix)     Type of Anesthesia:  General    Additional requests/questions:    [name]   07/26/2021, 9:21 AM

## 2021-07-27 DIAGNOSIS — M810 Age-related osteoporosis without current pathological fracture: Secondary | ICD-10-CM | POA: Diagnosis not present

## 2021-07-27 DIAGNOSIS — I10 Essential (primary) hypertension: Secondary | ICD-10-CM | POA: Diagnosis not present

## 2021-07-27 DIAGNOSIS — E785 Hyperlipidemia, unspecified: Secondary | ICD-10-CM | POA: Diagnosis not present

## 2021-07-27 DIAGNOSIS — I129 Hypertensive chronic kidney disease with stage 1 through stage 4 chronic kidney disease, or unspecified chronic kidney disease: Secondary | ICD-10-CM | POA: Diagnosis not present

## 2021-07-28 LAB — IMMUNOFIXATION REFLEX, SERUM

## 2021-07-28 LAB — PROTEIN ELECTROPHORESIS, SERUM, WITH REFLEX
A/G Ratio: 1.4 (ref 0.7–1.7)
Albumin ELP: 4 g/dL (ref 2.9–4.4)
Alpha-1-Globulin: 0.3 g/dL (ref 0.0–0.4)
Alpha-2-Globulin: 0.7 g/dL (ref 0.4–1.0)
Beta Globulin: 1 g/dL (ref 0.7–1.3)
Gamma Globulin: 1 g/dL (ref 0.4–1.8)
Globulin, Total: 2.9 g/dL (ref 2.2–3.9)
M-Spike, %: 0.3 g/dL — ABNORMAL HIGH
SPEP Interpretation: 0
Total Protein ELP: 6.9 g/dL (ref 6.0–8.5)

## 2021-07-29 ENCOUNTER — Encounter (HOSPITAL_COMMUNITY): Payer: Self-pay | Admitting: Neurological Surgery

## 2021-07-29 ENCOUNTER — Other Ambulatory Visit: Payer: Self-pay

## 2021-07-29 DIAGNOSIS — G629 Polyneuropathy, unspecified: Secondary | ICD-10-CM | POA: Diagnosis not present

## 2021-07-29 DIAGNOSIS — I10 Essential (primary) hypertension: Secondary | ICD-10-CM | POA: Diagnosis not present

## 2021-07-29 DIAGNOSIS — R410 Disorientation, unspecified: Secondary | ICD-10-CM | POA: Diagnosis not present

## 2021-07-29 DIAGNOSIS — M48062 Spinal stenosis, lumbar region with neurogenic claudication: Secondary | ICD-10-CM | POA: Diagnosis not present

## 2021-07-29 DIAGNOSIS — I2510 Atherosclerotic heart disease of native coronary artery without angina pectoris: Secondary | ICD-10-CM | POA: Diagnosis not present

## 2021-07-29 DIAGNOSIS — I69334 Monoplegia of upper limb following cerebral infarction affecting left non-dominant side: Secondary | ICD-10-CM | POA: Diagnosis not present

## 2021-07-29 NOTE — Progress Notes (Signed)
DUE TO COVID-19 ONLY ONE VISITOR IS ALLOWED TO COME WITH YOU AND STAY IN THE WAITING ROOM ONLY DURING PRE OP AND PROCEDURE DAY OF SURGERY.  ? ?PCP - Dr Burnard Bunting ?Cardiologist - Dr Daneen Schick ?Neurology - Butler Denmark, NP ( Has Appt w/ Dr Clydene Fake on 08/18/21) ?Oncology - Dr Burney Gauze ? ?Chest x-ray - 05/28/21 (1V) ?EKG - 07/06/21 ?Stress Test - 03/30/15 ?ECHO - 04/21/21 ?Cardiac Cath - n/a ? ?ICD Pacemaker/Loop - n/a ? ?Sleep Study -  n/a ?CPAP - none ? ?Blood Thinner Instructions:  Last dose was on 07/27/21 per patient. ? ?ERAS: Clear liquids til 9:45 AM DOS. ? ?Anesthesia review: Yes ? ?STOP now taking any Aspirin (unless otherwise instructed by your surgeon), Aleve, Naproxen, Ibuprofen, Motrin, Advil, Goody's, BC's, all herbal medications, fish oil, and all vitamins.  ? ?Coronavirus Screening ?Covid test n/a Ambulatory Surgery  ?Do you have any of the following symptoms:  ?Cough yes/no: No ?Fever (>100.1F)  yes/no: No ?Runny nose yes/no: No ?Sore throat yes/no: No ?Difficulty breathing/shortness of breath  yes/no: No ? ?Have you traveled in the last 14 days and where? yes/no: No ? ?Patient verbalized understanding of instructions that were given via phone. ?

## 2021-07-30 ENCOUNTER — Telehealth: Payer: Self-pay | Admitting: *Deleted

## 2021-07-30 NOTE — Progress Notes (Signed)
Anesthesia Chart Review: SAME DAY [facility]   Case: [id number] Date/Time: 08/02/21 1230   Procedure: T12 KYPHOPLASTY - RM 18   Anesthesia type: General   Pre-op diagnosis: Compression fracture of T12 Vertebra   Location: [facility] OR ROOM 18 / [place] [facility]   Surgeons: Kristeen Miss, MD       DISCUSSION: Patient is an 88 year old female scheduled for the above procedure.  History includes never smoker, HTN, HLD, hypothyroidism, GERD, hiatal hernia, IgA Kappa MGUS, peripheral neuropathy, spinal surgery (L4-5 PLIF 03/25/10;T8 & 11 kyphoplasty 2008; T7 kyphoplasty 2009; L3-4 laminotomies/foraminotomies 12/29/15), osteoarthritis (bilateral TKR; right revers shoulder arthroplasty 01/30/20), CVA (03/2021), left thoracentesis (05/28/21 for pleural effusion following fall with left rib fractures). She had evidence of non-obstructive CAD on coronary CTA in 2008. She reported that after her kyphoplasty she had to stay overnight due to "elevated blood pressure" (given Morphine).    Preoperative cardiology input outlined by Nicholes Rough, PA-C on 07/26/21: "Chart reviewed as part of pre-operative protocol coverage. Given past medical history and time since last visit, based on ACC/AHA guidelines, LEASHA GOLDBERGER would be at acceptable risk for the planned procedure without further cardiovascular testing.    I spoke with the patient on the phone today and she has no new cardiac issues since she was last seen in the office July 06, 2021.  She denies chest pain, shortness of breath, palpitations/fluttering in her chest.   She meets the METS requirement and has no trouble walking around her house and going up and down flights of stairs.  She did have a fall after her stroke and now is having a T12 kyphoplasty by Dr. Ellene Route.   In regards to holding Plavix, Dr. Clarice Pole office will need to reach out to Dr. Clydene Fake office for clearance (Appointment 3/22).  She was started on Plavix after her CVA in November by neurology."    Reportedly last Plavix 07/27/2021.  She is a [name], anesthesia team to evaluate on the day of surgery.  VS: Ht 5' (1.524 m)    Wt 61.2 kg    BMI 26.37 kg/m  BP Readings from Last 3 Encounters:  07/22/21 115/67  07/06/21 122/72  05/28/21 (!) 151/100   Pulse Readings from Last 3 Encounters:  07/22/21 84  07/06/21 75  04/22/21 87     PROVIDERS: Burnard Bunting, MD is PCP  - Burney Gauze, MD is HEM-ONC.Last visit 07/22/21.  Olen Pel, MD is cardiologist. Last visit 07/06/21 with Richardson Dopp, PA-C post CVA follow-up. Event monitor did not show any afib, but did show frequent PVCs. Plan was to refer to EP for consideration of loop recorder.  - Antony Contras, MD is neurologist   LABS: Most recent lab results include: Lab Results  Component Value Date   WBC 4.9 07/22/2021   HGB 14.1 07/22/2021   HCT 42.3 07/22/2021   PLT 319 07/22/2021   GLUCOSE 93 07/22/2021   CHOL 172 07/22/2021   TRIG 99 07/22/2021   HDL 89 07/22/2021   LDLCALC 63 07/22/2021   ALT 16 07/22/2021   AST 20 07/22/2021   NA 134 (L) 07/22/2021   K 3.7 07/22/2021   CL 97 (L) 07/22/2021   CREATININE 0.85 07/22/2021   BUN 15 07/22/2021   CO2 30 07/22/2021   TSH 1.522 04/21/2021   HGBA1C 5.3 04/21/2021     IMAGES: 1V CXR (post left thoracentesis) 05/28/21: FINDINGS: Small left pleural effusion with adjacent atelectasis. No pneumothorax. Right lung is clear. Normal heart size.  IMPRESSION: Small left pleural effusion with adjacent atelectasis. No pneumothorax.   CT Head & CTA Head/Neck 04/20/21: IMPRESSION: 1. Small focus of hypodensity in the right precentral gyrus corresponding to the evolving infarct seen on the [facility] brain MRI. The additional small infarcts seen on that study are not well seen on the current study. 2. Significant irregularity and beading of the high cervical internal carotid arteries is most suspicious for fibromuscular dysplasia. 3. Mild calcified  atherosclerotic plaque of the carotid bulbs without hemodynamically significant stenosis or occlusion. Otherwise, patent vasculature of the head and neck with no significant stenosis or occlusion.   MRI Brain 04/20/21: IMPRESSION: - Mildly motion degraded exam. - Multiple small acute infarcts within the right greater than left cerebral hemispheres and within the right cerebellum, as described and measuring up to 14 mm. There is involvement of multiple vascular territories, and findings are suspicious for an embolic process. - Mild-to-moderate chronic small vessel ischemic changes within the cerebral white matter.    EKG: 07/06/21 ([facility]): NSR with PVCs   CV: Cardiac event monitor 06/03/21-07/02/21: Normal sinus rhythm Frequent PVC's with 18% burden PAC's with 3% burden No sustained tachyarrhythmia No atrial fibrillation Some symptoms of flutter and heart racing correlate with PAC's and PVC's. No atrial fibrillation High burden PVC's  Echo 04/21/22: IMPRESSIONS   1. Left ventricular ejection fraction, by estimation, is 60 to 65%. The  left ventricle has normal function. The left ventricle has no regional  wall motion abnormalities. Left ventricular diastolic parameters are  consistent with Grade I diastolic  dysfunction (impaired relaxation).   2. Right ventricular systolic function is normal. The right ventricular  size is normal. Tricuspid regurgitation signal is inadequate for assessing  PA pressure.   3. The mitral valve is normal in structure. Trivial mitral valve  regurgitation. No evidence of mitral stenosis.   4. The aortic valve is tricuspid. Aortic valve regurgitation is trivial.  No aortic stenosis is present.   Nuclear stress test 03/12/15: Nuclear stress EF: 83%. There was no ST segment deviation noted during stress. The study is normal. No evidence of ischemia. This is a low risk study.   CTA Coronary 10/04/06:  IMPRESSION: 1.  This study  demonstrates basically normal coronary arteries for this patient's age.  The left main is widely patent.  The left anterior descending distributes around the left ventricular apex and is widely patent with < 30% obstruction from soft plaque and one fleck of calcium in the proximal vessel.  The circumflex and right coronaries are also normal.  2.  Normal left ventricular function.  3.  Prominent but not dilated or aneurysmal aorta.  4.   Calcium score of 1.0, suggesting a very low risk for future cardiac events. 5.  Please see the addendum for extracardiac findings.     Past Medical History:  Diagnosis Date   Anxiety    Arthritis    Chronic headaches    hx - now just occasional HA per patient   Colon polyps    Complication of anesthesia    Elevated blood pressure after having last Kyphoplasty; pt stated "I was given Morphine and had to stay overnight"   Coronary artery disease    Depression    DI (detrusor instability)    Fracture of vertebra    x 3   GERD (gastroesophageal reflux disease)    History of blood transfusion 2011   w/ knee replacement surgery   History of hiatal hernia    small - does  not cause any problems per patient   HLD (hyperlipidemia)    Hypertension    Hypothyroidism    Menopausal symptoms    MGUS (monoclonal gammopathy of unknown significance) 12/27/2017   IgA   Osteoporosis    Peripheral neuropathy 12/27/2017   Pneumonia    Restless leg syndrome    Stroke ([place]) 03/2021   mini strokes   TMJ click    Varicose veins     Past Surgical History:  Procedure Laterality Date   ABDOMINAL HYSTERECTOMY  1992   TAH,BSO   BLADDER SUSPENSION  2011   CRYOMESH   CARPAL TUNNEL RELEASE Bilateral 1982   CHOLECYSTECTOMY  1992   COLONOSCOPY     EYE SURGERY Bilateral    Cataract removal   IR THORACENTESIS ASP PLEURAL SPACE W/IMG GUIDE  05/28/2021   KYPHOPLASTY     X 3   LUMBAR LAMINECTOMY WITH COFLEX 1 LEVEL N/A 12/29/2015   Procedure: L3-4 Laminectomy with  Coflex;  Surgeon: Kristeen Miss, MD;  Location: [place] [facility];  Service: Neurosurgery;  Laterality: N/A;  L3-4 Laminectomy with coflex   OOPHORECTOMY     BSO   REPLACEMENT TOTAL KNEE Bilateral 2008,  2011   RIGHT 2008, LEFT 2011   REVERSE SHOULDER ARTHROPLASTY Right 01/30/2020   Procedure: REVERSE SHOULDER ARTHROPLASTY;  Surgeon: Nicholes Stairs, MD;  Location: [facility];  Service: Orthopedics;  Laterality: Right;  2.5 hrs   SPINAL FUSION  2011   VERTEBRAL SURGERY     T-8, T-10. T-12  DR Roselee Culver    MEDICATIONS: No current facility-administered medications for this encounter.    ALPRAZolam (XANAX) 0.5 MG tablet   amLODipine (NORVASC) 5 MG tablet   amoxicillin (AMOXIL) 500 MG capsule   Ascorbic Acid (VITAMIN C PO)   Calcium Carbonate-Vitamin D (CALCIUM-D PO)   clopidogrel (PLAVIX) 75 MG tablet   dexlansoprazole (DEXILANT) 60 MG capsule   escitalopram (LEXAPRO) 10 MG tablet   levothyroxine (SYNTHROID, LEVOTHROID) 25 MCG tablet   metoprolol succinate (TOPROL-XL) 25 MG 24 hr tablet   Multiple Vitamin (MULTIVITAMIN WITH MINERALS) TABS tablet   NONFORMULARY OR COMPOUNDED ITEM   Polyvinyl Alcohol-Povidone (REFRESH OP)   rosuvastatin (CRESTOR) 20 MG tablet   triamterene-hydrochlorothiazide (MAXZIDE-25) 37.5-25 MG tablet   Vitamin D, Ergocalciferol, (DRISDOL) 50000 UNITS CAPS   VITAMIN E PO   Wheat Dextrin (BENEFIBER) POWD   zolpidem (AMBIEN CR) 12.5 MG CR tablet   Myra Gianotti, PA-C Surgical Short Stay/Anesthesiology [facility] Phone [phone] [facility] Phone [phone] 07/30/2021 12:41 PM

## 2021-07-30 NOTE — Telephone Encounter (Signed)
Call received from patient requesting M-Spike results.  Pt notified per order of Dr. Marin Olp that the M-Spike is stable at 0.3.  Pt requests that all lab results be called to her and not sent via mychart.  Note placed in patient's chart.  ?

## 2021-07-30 NOTE — Anesthesia Preprocedure Evaluation (Addendum)
Anesthesia Evaluation  ?Patient identified by MRN, date of birth, ID band ?Patient awake ? ? ? ?Reviewed: ?Allergy & Precautions, NPO status , Patient's Chart, lab work & pertinent test results, reviewed documented beta blocker date and time  ? ?History of Anesthesia Complications ?Negative for: history of anesthetic complications ? ?Airway ?Mallampati: II ? ?TM Distance: >3 FB ?Neck ROM: Full ? ? ? Dental ? ?(+) Dental Advisory Given, Teeth Intact ?  ?Pulmonary ?neg pulmonary ROS,  ?  ?Pulmonary exam normal ? ? ? ? ? ? ? Cardiovascular ?hypertension, Pt. on medications and Pt. on home beta blockers ?+ CAD  ?Normal cardiovascular exam ? ? ?  ?Neuro/Psych ? Headaches, PSYCHIATRIC DISORDERS Anxiety Depression TIA Neuromuscular disease   ? GI/Hepatic ?Neg liver ROS, hiatal hernia, GERD  Medicated and Controlled,  ?Endo/Other  ?Hypothyroidism  ? Renal/GU ?negative Renal ROS  ? ?  ?Musculoskeletal ? ?(+) Arthritis ,  ? Abdominal ?  ?Peds ? Hematology ? ?On plavix ?MGUS ?   ?Anesthesia Other Findings ? ? Reproductive/Obstetrics ? ?  ? ? ? ? ? ? ? ? ? ? ? ? ? ?  ?  ? ? ? ? ? ?                                  Anesthesia Evaluation  ?Patient identified by MRN, date of birth, ID band ?Patient awake ? ? ? ?Reviewed: ?Allergy & Precautions, NPO status , Patient's Chart, lab work & pertinent test results ? ?Airway ?Mallampati: II ? ?TM Distance: >3 FB ?Neck ROM: Limited ? ? ? Dental ?no notable dental hx. ? ?  ?Pulmonary ?neg pulmonary ROS,  ?  ?Pulmonary exam normal ?breath sounds clear to auscultation ? ? ? ? ? ? Cardiovascular ?hypertension, Normal cardiovascular exam ?Rhythm:Regular Rate:Normal ? ? ?  ?Neuro/Psych ?negative neurological ROS ? negative psych ROS  ? GI/Hepatic ?Neg liver ROS, GERD  ,  ?Endo/Other  ?Hypothyroidism  ? Renal/GU ?negative Renal ROS  ?negative genitourinary ?  ?Musculoskeletal ? ?(+) Arthritis , Osteoarthritis,   ? Abdominal ?  ?Peds ?negative pediatric ROS ?(+)   Hematology ?negative hematology ROS ?(+)   ?Anesthesia Other Findings ? ? Reproductive/Obstetrics ?negative OB ROS ? ?  ? ? ? ? ? ? ? ? ? ? ? ? ? ?  ?  ? ? ? ? ? ? ? ? ?Anesthesia Physical ?Anesthesia Plan ? ?ASA: II ? ?Anesthesia Plan: General  ? ?Post-op Pain Management:  Regional for Post-op pain  ? ?Induction: Intravenous ? ?PONV Risk Score and Plan: 3 and Ondansetron, Dexamethasone and Treatment may vary due to age or medical condition ? ?Airway Management Planned: Oral ETT ? ?Additional Equipment:  ? ?Intra-op Plan:  ? ?Post-operative Plan: Extubation in OR ? ?Informed Consent: I have reviewed the patients History and Physical, chart, labs and discussed the procedure including the risks, benefits and alternatives for the proposed anesthesia with the patient or authorized representative who has indicated his/her understanding and acceptance.  ? ? ? ?Dental advisory given ? ?Plan Discussed with: CRNA and Surgeon ? ?Anesthesia Plan Comments:   ? ? ? ? ? ? ?Anesthesia Quick Evaluation ? ?Anesthesia Physical ?Anesthesia Plan ? ?ASA: 3 ? ?Anesthesia Plan: General  ? ?Post-op Pain Management: Tylenol PO (pre-op)*  ? ?Induction: Intravenous ? ?PONV Risk Score and Plan: 3 and Treatment may vary due to age or medical condition, Ondansetron and Propofol infusion ? ?Airway  Management Planned: Oral ETT ? ?Additional Equipment: None ? ?Intra-op Plan:  ? ?Post-operative Plan: Extubation in OR ? ?Informed Consent: I have reviewed the patients History and Physical, chart, labs and discussed the procedure including the risks, benefits and alternatives for the proposed anesthesia with the patient or authorized representative who has indicated his/her understanding and acceptance.  ? ? ? ?Dental advisory given ? ?Plan Discussed with: CRNA and Anesthesiologist ? ?Anesthesia Plan Comments: ( ?)  ? ? ? ? ?Anesthesia Quick Evaluation ? ?

## 2021-08-02 ENCOUNTER — Ambulatory Visit (HOSPITAL_COMMUNITY)
Admission: RE | Admit: 2021-08-02 | Discharge: 2021-08-02 | Disposition: A | Discharge status: Home / Self Care | Payer: Medicare Other | Attending: Neurological Surgery | Admitting: Neurological Surgery

## 2021-08-02 ENCOUNTER — Other Ambulatory Visit: Payer: Self-pay

## 2021-08-02 ENCOUNTER — Encounter (HOSPITAL_COMMUNITY)
Admission: RE | Disposition: A | Discharge status: Home / Self Care | Payer: Self-pay | Source: Home / Self Care | Attending: Neurological Surgery

## 2021-08-02 ENCOUNTER — Ambulatory Visit (HOSPITAL_COMMUNITY): Payer: Medicare Other

## 2021-08-02 ENCOUNTER — Ambulatory Visit (HOSPITAL_COMMUNITY): Payer: Medicare Other | Admitting: Vascular Surgery

## 2021-08-02 ENCOUNTER — Ambulatory Visit (HOSPITAL_BASED_OUTPATIENT_CLINIC_OR_DEPARTMENT_OTHER): Payer: Medicare Other | Admitting: Vascular Surgery

## 2021-08-02 ENCOUNTER — Encounter (HOSPITAL_COMMUNITY): Payer: Self-pay | Admitting: Neurological Surgery

## 2021-08-02 DIAGNOSIS — E039 Hypothyroidism, unspecified: Secondary | ICD-10-CM | POA: Insufficient documentation

## 2021-08-02 DIAGNOSIS — M8008XA Age-related osteoporosis with current pathological fracture, vertebra(e), initial encounter for fracture: Secondary | ICD-10-CM | POA: Diagnosis not present

## 2021-08-02 DIAGNOSIS — G629 Polyneuropathy, unspecified: Secondary | ICD-10-CM | POA: Diagnosis not present

## 2021-08-02 DIAGNOSIS — I10 Essential (primary) hypertension: Secondary | ICD-10-CM

## 2021-08-02 DIAGNOSIS — K449 Diaphragmatic hernia without obstruction or gangrene: Secondary | ICD-10-CM | POA: Insufficient documentation

## 2021-08-02 DIAGNOSIS — Z8249 Family history of ischemic heart disease and other diseases of the circulatory system: Secondary | ICD-10-CM | POA: Insufficient documentation

## 2021-08-02 DIAGNOSIS — Z79899 Other long term (current) drug therapy: Secondary | ICD-10-CM | POA: Diagnosis not present

## 2021-08-02 DIAGNOSIS — I2510 Atherosclerotic heart disease of native coronary artery without angina pectoris: Secondary | ICD-10-CM

## 2021-08-02 DIAGNOSIS — M1990 Unspecified osteoarthritis, unspecified site: Secondary | ICD-10-CM | POA: Insufficient documentation

## 2021-08-02 DIAGNOSIS — M4854XA Collapsed vertebra, not elsewhere classified, thoracic region, initial encounter for fracture: Secondary | ICD-10-CM | POA: Diagnosis not present

## 2021-08-02 DIAGNOSIS — Z8673 Personal history of transient ischemic attack (TIA), and cerebral infarction without residual deficits: Secondary | ICD-10-CM | POA: Insufficient documentation

## 2021-08-02 DIAGNOSIS — S22080A Wedge compression fracture of T11-T12 vertebra, initial encounter for closed fracture: Secondary | ICD-10-CM | POA: Diagnosis not present

## 2021-08-02 DIAGNOSIS — E785 Hyperlipidemia, unspecified: Secondary | ICD-10-CM | POA: Diagnosis not present

## 2021-08-02 DIAGNOSIS — K219 Gastro-esophageal reflux disease without esophagitis: Secondary | ICD-10-CM | POA: Insufficient documentation

## 2021-08-02 DIAGNOSIS — F419 Anxiety disorder, unspecified: Secondary | ICD-10-CM | POA: Diagnosis not present

## 2021-08-02 DIAGNOSIS — D472 Monoclonal gammopathy: Secondary | ICD-10-CM | POA: Diagnosis not present

## 2021-08-02 DIAGNOSIS — M40204 Unspecified kyphosis, thoracic region: Secondary | ICD-10-CM | POA: Diagnosis not present

## 2021-08-02 DIAGNOSIS — S22080G Wedge compression fracture of T11-T12 vertebra, subsequent encounter for fracture with delayed healing: Secondary | ICD-10-CM | POA: Diagnosis not present

## 2021-08-02 DIAGNOSIS — Z419 Encounter for procedure for purposes other than remedying health state, unspecified: Secondary | ICD-10-CM

## 2021-08-02 DIAGNOSIS — F32A Depression, unspecified: Secondary | ICD-10-CM | POA: Insufficient documentation

## 2021-08-02 HISTORY — DX: Other symptoms and signs involving the musculoskeletal system: R29.898

## 2021-08-02 HISTORY — DX: Depression, unspecified: F32.A

## 2021-08-02 HISTORY — PX: KYPHOPLASTY: SHX5884

## 2021-08-02 SURGERY — KYPHOPLASTY
Anesthesia: General | Site: Spine Thoracic

## 2021-08-02 MED ORDER — LACTATED RINGERS IV SOLN: INTRAVENOUS | Status: DC

## 2021-08-02 MED ORDER — BUPIVACAINE HCL (PF) 0.5 % IJ SOLN
INTRAMUSCULAR | Status: AC
  Filled 2021-08-02: qty 30

## 2021-08-02 MED ORDER — ONDANSETRON HCL 4 MG/2ML IJ SOLN
INTRAMUSCULAR | Status: DC | PRN
Start: 2021-08-02 — End: 2021-08-02
  Administered 2021-08-02: 4 mg via INTRAVENOUS

## 2021-08-02 MED ORDER — PROPOFOL 10 MG/ML IV BOLUS
INTRAVENOUS | Status: DC | PRN
  Administered 2021-08-02: 110 mg via INTRAVENOUS

## 2021-08-02 MED ORDER — PHENYLEPHRINE 40 MCG/ML (10ML) SYRINGE FOR IV PUSH (FOR BLOOD PRESSURE SUPPORT)
PREFILLED_SYRINGE | INTRAVENOUS | Status: DC | PRN
  Administered 2021-08-02: 80 ug via INTRAVENOUS

## 2021-08-02 MED ORDER — ACETAMINOPHEN 500 MG PO TABS
500.0000 mg | ORAL_TABLET | Freq: Once | ORAL | Status: AC
  Administered 2021-08-02: 500 mg via ORAL

## 2021-08-02 MED ORDER — ORAL CARE MOUTH RINSE: 15.0000 mL | Freq: Once | OROMUCOSAL | Status: AC

## 2021-08-02 MED ORDER — CHLORHEXIDINE GLUCONATE CLOTH 2 % EX PADS
6.0000 | MEDICATED_PAD | Freq: Once | CUTANEOUS | Status: DC
Start: 2021-08-02 — End: 2021-08-02

## 2021-08-02 MED ORDER — FENTANYL CITRATE (PF) 100 MCG/2ML IJ SOLN
INTRAMUSCULAR | Status: DC | PRN
  Administered 2021-08-02: 50 ug via INTRAVENOUS

## 2021-08-02 MED ORDER — MIDAZOLAM HCL 2 MG/2ML IJ SOLN
INTRAMUSCULAR | Status: AC
  Filled 2021-08-02: qty 2

## 2021-08-02 MED ORDER — LIDOCAINE 2% (20 MG/ML) 5 ML SYRINGE
INTRAMUSCULAR | Status: AC
  Filled 2021-08-02: qty 5

## 2021-08-02 MED ORDER — LIDOCAINE 2% (20 MG/ML) 5 ML SYRINGE
INTRAMUSCULAR | Status: DC | PRN
  Administered 2021-08-02: 40 mg via INTRAVENOUS

## 2021-08-02 MED ORDER — IOPAMIDOL (ISOVUE-300) INJECTION 61%
INTRAVENOUS | Status: DC | PRN
  Administered 2021-08-02: 10 mL

## 2021-08-02 MED ORDER — MIDAZOLAM HCL 2 MG/2ML IJ SOLN
INTRAMUSCULAR | Status: DC | PRN
  Administered 2021-08-02: 1 mg via INTRAVENOUS

## 2021-08-02 MED ORDER — BUPIVACAINE HCL (PF) 0.5 % IJ SOLN
INTRAMUSCULAR | Status: DC | PRN
  Administered 2021-08-02: 10 mL

## 2021-08-02 MED ORDER — CHLORHEXIDINE GLUCONATE 0.12 % MT SOLN: 15.0000 mL | Freq: Once | OROMUCOSAL | Status: AC

## 2021-08-02 MED ORDER — DEXAMETHASONE SODIUM PHOSPHATE 10 MG/ML IJ SOLN
INTRAMUSCULAR | Status: AC
  Filled 2021-08-02: qty 1

## 2021-08-02 MED ORDER — HYDROCODONE-ACETAMINOPHEN 5-325 MG PO TABS: 1.0000 | ORAL_TABLET | ORAL | 0 refills | Status: DC | PRN

## 2021-08-02 MED ORDER — CEFAZOLIN SODIUM-DEXTROSE 2-4 GM/100ML-% IV SOLN
2.0000 g | INTRAVENOUS | Status: AC
  Administered 2021-08-02: 2 g via INTRAVENOUS
  Filled 2021-08-02: qty 100

## 2021-08-02 MED ORDER — CHLORHEXIDINE GLUCONATE 0.12 % MT SOLN
OROMUCOSAL | Status: AC
  Administered 2021-08-02: 15 mL via OROMUCOSAL
  Filled 2021-08-02: qty 15

## 2021-08-02 MED ORDER — ACETAMINOPHEN 500 MG PO TABS
1000.0000 mg | ORAL_TABLET | Freq: Once | ORAL | Status: DC
  Filled 2021-08-02: qty 2

## 2021-08-02 MED ORDER — ONDANSETRON HCL 4 MG/2ML IJ SOLN
INTRAMUSCULAR | Status: AC
  Filled 2021-08-02: qty 2

## 2021-08-02 MED ORDER — DEXAMETHASONE SODIUM PHOSPHATE 10 MG/ML IJ SOLN
INTRAMUSCULAR | Status: DC | PRN
  Administered 2021-08-02: 5 mg via INTRAVENOUS

## 2021-08-02 MED ORDER — ROCURONIUM BROMIDE 10 MG/ML (PF) SYRINGE
PREFILLED_SYRINGE | INTRAVENOUS | Status: AC
  Filled 2021-08-02: qty 10

## 2021-08-02 MED ORDER — SUGAMMADEX SODIUM 200 MG/2ML IV SOLN
INTRAVENOUS | Status: DC | PRN
  Administered 2021-08-02 (×2): 200 mg via INTRAVENOUS

## 2021-08-02 MED ORDER — PROPOFOL 10 MG/ML IV BOLUS
INTRAVENOUS | Status: AC
  Filled 2021-08-02: qty 20

## 2021-08-02 MED ORDER — LIDOCAINE-EPINEPHRINE 1 %-1:100000 IJ SOLN
INTRAMUSCULAR | Status: DC | PRN
  Administered 2021-08-02: 10 mL

## 2021-08-02 MED ORDER — 0.9 % SODIUM CHLORIDE (POUR BTL) OPTIME
TOPICAL | Status: DC | PRN
  Administered 2021-08-02: 1000 mL

## 2021-08-02 MED ORDER — PHENYLEPHRINE 40 MCG/ML (10ML) SYRINGE FOR IV PUSH (FOR BLOOD PRESSURE SUPPORT)
PREFILLED_SYRINGE | INTRAVENOUS | Status: AC
  Filled 2021-08-02: qty 10

## 2021-08-02 MED ORDER — FENTANYL CITRATE (PF) 250 MCG/5ML IJ SOLN
INTRAMUSCULAR | Status: AC
  Filled 2021-08-02: qty 5

## 2021-08-02 MED ORDER — ROCURONIUM BROMIDE 10 MG/ML (PF) SYRINGE
PREFILLED_SYRINGE | INTRAVENOUS | Status: DC | PRN
  Administered 2021-08-02: 50 mg via INTRAVENOUS

## 2021-08-02 MED ORDER — LIDOCAINE-EPINEPHRINE 1 %-1:100000 IJ SOLN
INTRAMUSCULAR | Status: AC
  Filled 2021-08-02: qty 1

## 2021-08-02 SURGICAL SUPPLY — 41 items
ADH SKN CLS APL DERMABOND .7 (GAUZE/BANDAGES/DRESSINGS) ×1
BAG COUNTER SPONGE SURGICOUNT (BAG) ×2 IMPLANT
BAG SPNG CNTER NS LX DISP (BAG) ×1
BLADE CLIPPER SURG (BLADE) IMPLANT
BLADE SURG 11 STRL SS (BLADE) ×2 IMPLANT
BNDG ADH 1X3 SHEER STRL LF (GAUZE/BANDAGES/DRESSINGS) ×8 IMPLANT
BNDG ADH THN 3X1 STRL LF (GAUZE/BANDAGES/DRESSINGS) ×4
CEMENT KYPHON C01A KIT/MIXER (Cement) ×1 IMPLANT
CONT SPEC 4OZ CLIKSEAL STRL BL (MISCELLANEOUS) ×2 IMPLANT
DECANTER SPIKE VIAL GLASS SM (MISCELLANEOUS) ×1 IMPLANT
DERMABOND ADVANCED (GAUZE/BANDAGES/DRESSINGS) ×1
DERMABOND ADVANCED .7 DNX12 (GAUZE/BANDAGES/DRESSINGS) IMPLANT
DRAPE C-ARM 42X72 X-RAY (DRAPES) ×2 IMPLANT
DRAPE HALF SHEET 40X57 (DRAPES) ×2 IMPLANT
DRAPE INCISE IOBAN 66X45 STRL (DRAPES) ×2 IMPLANT
DRAPE LAPAROTOMY 100X72X124 (DRAPES) ×2 IMPLANT
DRAPE WARM FLUID 44X44 (DRAPES) ×2 IMPLANT
DURAPREP 26ML APPLICATOR (WOUND CARE) ×2 IMPLANT
GAUZE 4X4 16PLY ~~LOC~~+RFID DBL (SPONGE) ×2 IMPLANT
GLOVE EXAM NITRILE XL STR (GLOVE) IMPLANT
GLOVE SURG LTX SZ8.5 (GLOVE) ×2 IMPLANT
GLOVE SURG UNDER POLY LF SZ8.5 (GLOVE) ×2 IMPLANT
GOWN STRL REUS W/ TWL LRG LVL3 (GOWN DISPOSABLE) IMPLANT
GOWN STRL REUS W/ TWL XL LVL3 (GOWN DISPOSABLE) ×1 IMPLANT
GOWN STRL REUS W/TWL 2XL LVL3 (GOWN DISPOSABLE) ×2 IMPLANT
GOWN STRL REUS W/TWL LRG LVL3 (GOWN DISPOSABLE)
GOWN STRL REUS W/TWL XL LVL3 (GOWN DISPOSABLE) ×2
KIT BASIN OR (CUSTOM PROCEDURE TRAY) ×2 IMPLANT
KIT TURNOVER KIT B (KITS) ×2 IMPLANT
NDL HYPO 25X1 1.5 SAFETY (NEEDLE) ×1 IMPLANT
NEEDLE HYPO 25X1 1.5 SAFETY (NEEDLE) ×2 IMPLANT
NS IRRIG 1000ML POUR BTL (IV SOLUTION) ×2 IMPLANT
PACK SURGICAL SETUP 50X90 (CUSTOM PROCEDURE TRAY) ×2 IMPLANT
PAD ARMBOARD 7.5X6 YLW CONV (MISCELLANEOUS) ×9 IMPLANT
SPECIMEN JAR SMALL (MISCELLANEOUS) IMPLANT
SUT VIC AB 4-0 RB1 18 (SUTURE) ×1 IMPLANT
SUT VICRYL RAPIDE 4/0 PS 2 (SUTURE) ×2 IMPLANT
SYR CONTROL 10ML LL (SYRINGE) ×4 IMPLANT
TOWEL GREEN STERILE (TOWEL DISPOSABLE) ×2 IMPLANT
TOWEL GREEN STERILE FF (TOWEL DISPOSABLE) ×2 IMPLANT
TRAY KYPHOPAK 15/3 ONESTEP 1ST (MISCELLANEOUS) ×1 IMPLANT

## 2021-08-02 NOTE — Transfer of Care (Signed)
Immediate Anesthesia Transfer of Care Note ? ?Patient: [name] ? ?Procedure(s) Performed: Thoracic Twelve KYPHOPLASTY (Spine Thoracic) ? ?Patient Location: PACU ? ?Anesthesia Type:General ? ?Level of Consciousness: drowsy and patient cooperative ? ?Airway & Oxygen Therapy: Patient Spontanous Breathing and Patient connected to nasal cannula oxygen ? ?Post-op Assessment: Report given to RN, Post -op Vital signs reviewed and stable and Patient moving all extremities ? ?Post vital signs: Reviewed and stable ? ?Last Vitals:  ?Vitals Value Taken Time  ?BP 164/80 08/02/21 1504  ?Temp    ?Pulse 88 08/02/21 1506  ?Resp 26 08/02/21 1506  ?SpO2 99 % 08/02/21 1506  ?Vitals shown include unvalidated device data. ? ?Last Pain:  ?Vitals:  ? 08/02/21 1109  ?TempSrc:   ?PainSc: 0-No pain  ?   ? ?  ? ?Complications: No notable events documented. ?

## 2021-08-02 NOTE — Anesthesia Postprocedure Evaluation (Signed)
Anesthesia Post Note ? ?Patient: [name] ? ?Procedure(s) Performed: Thoracic Twelve KYPHOPLASTY (Spine Thoracic) ? ?  ? ?Patient location during evaluation: PACU ?Anesthesia Type: General ?Level of consciousness: awake and alert ?Pain management: pain level controlled ?Vital Signs Assessment: post-procedure vital signs reviewed and stable ?Respiratory status: spontaneous breathing, nonlabored ventilation and respiratory function stable ?Cardiovascular status: stable and blood pressure returned to baseline ?Anesthetic complications: no ? ? ?No notable events documented. ? ?Last Vitals:  ?Vitals:  ? 08/02/21 1520 08/02/21 1535  ?BP: (!) 151/58 (!) 169/70  ?Pulse: 87 85  ?Resp: 15 16  ?Temp:  36.4 ?C  ?SpO2: 97% 96%  ?  ?Last Pain:  ?Vitals:  ? 08/02/21 1535  ?TempSrc:   ?PainSc: 0-No pain  ? ? ?  ?  ?  ?  ?  ?  ? ?Audry Pili ? ? ? ? ?

## 2021-08-02 NOTE — H&P (Signed)
[name] is an 88 y.o. female.   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  She presented with new onset back pain for a couple of months duration and on x-ray was found to have a subacute T12 compression fracture.  Patient notes that the pain has not been improving despite the passage of time and I advised consideration of acrylic balloon kyphoplasty.  She is now admitted for that procedure. ? ?Past Medical History:  ?Diagnosis Date  ? Anxiety   ? Arthritis   ? Chronic headaches   ? hx - now just occasional HA per patient  ? Colon polyps   ? Complication of anesthesia   ? Elevated blood pressure after having last Kyphoplasty; pt stated "I was given Morphine and had to stay overnight"  ? Coronary artery disease   ? Depression   ? DI (detrusor instability)   ? Fracture of vertebra   ? x 3  ? GERD (gastroesophageal reflux disease)   ? History of blood transfusion 2011  ? w/ knee replacement surgery  ? History of hiatal hernia   ? small - does not cause any problems per patient  ? HLD (hyperlipidemia)   ? Hypertension   ? Hypothyroidism   ? Menopausal symptoms   ? MGUS (monoclonal gammopathy of unknown significance) 12/27/2017  ? IgA  ? Osteoporosis   ? Peripheral neuropathy 12/27/2017  ? Pneumonia   ? Restless leg syndrome   ? Stroke [facility]) 03/2021  ? mini strokes  ? TMJ click   ? Varicose veins   ? ? ?Past Surgical History:  ?Procedure Laterality Date  ? ABDOMINAL HYSTERECTOMY  1992  ? TAH,BSO  ? BLADDER SUSPENSION  2011  ? CRYOMESH  ? CARPAL TUNNEL RELEASE Bilateral 1982  ? CHOLECYSTECTOMY  1992  ? COLONOSCOPY    ? EYE SURGERY Bilateral   ? Cataract removal  ? IR THORACENTESIS ASP PLEURAL SPACE W/IMG GUIDE  05/28/2021  ? KYPHOPLASTY    ? X 3  ? LUMBAR LAMINECTOMY WITH COFLEX 1 LEVEL N/A 12/29/2015  ? Procedure: L3-4 Laminectomy with Coflex;  Surgeon: Kristeen Miss, MD;  Location: [place] [facility];  Service:  Neurosurgery;  Laterality: N/A;  L3-4 Laminectomy with coflex  ? OOPHORECTOMY    ? BSO  ? REPLACEMENT TOTAL KNEE Bilateral 2008,  2011  ? RIGHT 2008, LEFT 2011  ? REVERSE SHOULDER ARTHROPLASTY Right 01/30/2020  ? Procedure: REVERSE SHOULDER ARTHROPLASTY;  Surgeon: Nicholes Stairs, MD;  Location: [facility];  Service: Orthopedics;  Laterality: Right;  2.5 hrs  ? SPINAL FUSION  2011  ? VERTEBRAL SURGERY    ? T-8, T-10. T-12  DR Roselee Culver  ? ? ?Family History  ?Problem Relation Age of Onset  ? Heart disease Mother   ? Heart disease Father   ? Stroke Father   ? Hypertension Sister   ? Diabetes Sister   ? Ovarian cancer Sister   ? Diabetes Sister   ? Rectal cancer Sister   ?     ? Colon or rectal  ? Hyperlipidemia Sister   ? Hypertension Sister   ? Hyperlipidemia Sister   ? Hypertension Sister   ? Hypertension Brother   ? Heart disease Brother   ? Heart disease Brother   ? Hypertension Brother   ? Heart disease Brother   ? Heart disease Brother   ? Heart disease Brother   ? Heart disease Brother   ?  Breast cancer Niece   ? Heart disease Son   ? Hyperlipidemia Son   ? Hyperlipidemia Daughter   ? Hypertension Daughter   ? ?Social History:  reports that she has never smoked. She has never used smokeless tobacco. She reports that she does not drink alcohol and does not use drugs. ? ?Allergies: No Known Allergies ? ?Medications Prior to Admission  ?Medication Sig Dispense Refill  ? ALPRAZolam (XANAX) 0.5 MG tablet Take 0.25 mg by mouth at bedtime as needed (restless legs).    ? amLODipine (NORVASC) 5 MG tablet Take 5 mg by mouth daily.    ? Ascorbic Acid (VITAMIN C PO) Take 1 tablet by mouth daily.    ? Calcium Carbonate-Vitamin D (CALCIUM-D PO) Take 1 tablet by mouth daily.    ? clopidogrel (PLAVIX) 75 MG tablet Take 1 tablet (75 mg total) by mouth daily. 30 tablet 3  ? dexlansoprazole (DEXILANT) 60 MG capsule Take 60 mg by mouth every morning.    ? escitalopram (LEXAPRO) 10 MG tablet Take 10 mg by mouth at bedtime.    ?  levothyroxine (SYNTHROID, LEVOTHROID) 25 MCG tablet Take 25 mcg by mouth daily before breakfast.     ? metoprolol succinate (TOPROL-XL) 25 MG 24 hr tablet Take 25 mg by mouth every morning.    ? Multiple Vitamin (MULTIVITAMIN WITH MINERALS) TABS tablet Take 1 tablet by mouth daily.    ? NONFORMULARY OR COMPOUNDED ITEM Apply 1 application topically See admin instructions. Transdermal therapeutics  meloxicam 0.5%, doxepin 3%, amantadine 3%, dextromethorphan 2%, lidocaine 2%. - compounded at [place]:  ?Apply topically to legs daily after showering for neuropathy    ? rosuvastatin (CRESTOR) 20 MG tablet Take 1 tablet (20 mg total) by mouth daily. 30 tablet 0  ? triamterene-hydrochlorothiazide (MAXZIDE-25) 37.5-25 MG tablet Take 1 tablet by mouth every morning. Hold this medication for now, please see your pcp for hospital discharge follow up, repeat sodium level, please discussion resumption or discontinuation this meds (Patient taking differently: Take 1 tablet by mouth every morning.)    ? Vitamin D, Ergocalciferol, (DRISDOL) 50000 UNITS CAPS Take 50,000 Units by mouth every Friday.    ? VITAMIN E PO Take 1 capsule by mouth daily.    ? Wheat Dextrin (BENEFIBER) POWD Take 1 daily dose (Patient taking differently: Take 1 Scoop by mouth daily. Take 1 daily dose) 500 g 0  ? zolpidem (AMBIEN CR) 12.5 MG CR tablet Take 12.5 mg by mouth at bedtime.    ? amoxicillin (AMOXIL) 500 MG capsule Take 2,000 mg by mouth as needed (dental). Take 4 capsules (2000 mg) by mouth one hour prior to dental appointments    ? Polyvinyl Alcohol-Povidone (REFRESH OP) Place 1 drop into both eyes daily as needed (dry eyes).    ? ? ?No results found for this or any previous visit (from the past 48 hour(s)). ?No results found. ? ?Review of Systems  ?Constitutional:  Positive for activity change.  ?Musculoskeletal:  Positive for back pain.  ?All other systems reviewed and are negative. ? ?Blood pressure (!) 166/64, pulse 81, temperature  (!) 97.5 ?F (36.4 ?C), temperature source Oral, resp. rate 18, height '5\' 1"'$  (1.549 m), weight 61.2 kg, SpO2 97 %. ?Physical Exam ?Constitutional:   ?   Appearance: Normal appearance. She is normal weight.  ?HENT:  ?   Head: Normocephalic and atraumatic.  ?   Right Ear: Tympanic membrane normal.  ?   Left Ear: Tympanic membrane normal.  ?  Nose: Nose normal.  ?   Mouth/Throat:  ?   Mouth: Mucous membranes are moist.  ?Eyes:  ?   Extraocular Movements: Extraocular movements intact.  ?   Conjunctiva/sclera: Conjunctivae normal.  ?   Pupils: Pupils are equal, round, and reactive to light.  ?Cardiovascular:  ?   Rate and Rhythm: Normal rate and regular rhythm.  ?   Pulses: Normal pulses.  ?   Heart sounds: Normal heart sounds.  ?Pulmonary:  ?   Effort: Pulmonary effort is normal.  ?   Breath sounds: Normal breath sounds.  ?Abdominal:  ?   General: Abdomen is flat. Bowel sounds are normal.  ?   Palpations: Abdomen is soft.  ?Musculoskeletal:     ?   General: Normal range of motion.  ?   Cervical back: Normal range of motion and neck supple.  ?Skin: ?   General: Skin is warm and dry.  ?Neurological:  ?   General: No focal deficit present.  ?   Mental Status: She is alert.  ?Psychiatric:     ?   Mood and Affect: Mood normal.     ?   Behavior: Behavior normal.  ?  ? ?Assessment/Plan ?T12 subacute compression fracture with delayed healing.  Back pain. ? ?Plan: Acrylic balloon kyphoplasty T12 ? ?Earleen Newport, MD ?08/02/2021, 1:41 PM ? ? ? ?

## 2021-08-02 NOTE — Op Note (Signed)
Date of surgery: 08/02/2021 ?Preoperative diagnosis: T12 compression fracture with delayed healing ?Postoperative diagnosis: Same ?Procedure: F64 acrylic balloon kyphoplasty ?Surgeon: Kristeen Miss ?Anesthesia: General endotracheal ?Indications: [name] is an 88 year old individual whose had significant pain for little over a month and a half she had a fall about 2 months ago and was having pain since that time it was noted that she had a T12 subacute compression fracture and she was advised regarding acrylic balloon kyphoplasty.  She has had previous kyphoplasty for other fractures and tolerated them well. ? ?Procedure: The patient was brought to the operating room supine on the stretcher, she was carefully turned prone after the institution of general endotracheal anesthesia.  The back was prepped with alcohol DuraPrep after AP and lateral fluoroscopic imaging was put into position and secured.  The back was prepped and then draped sterilely.  Then by choosing an entry site lateral to the pedicles at the T12 level the skin was infiltrated and a small vertical incision was created with a #11 blade.  A Jamshidi needle was then inserted via the transpedicular route into the vertebral body of T12.  A drill was used to drill into the vertebral body and then a balloon was inserted on either side.  The balloon was inflated to 250 mmHg and gradually the pressure dissipated down to approximately 150 mm a total of 3 cc was then inserted into each balloon under significant pressure and this showed creation of a cavity within the vertebral body.  No reduction of the compression fracture was noted.  Then a total of 6 cc of cement was injected 3 cc on each side into the vertebral body at T12.  Minimal evidence of extravasation was noted on the right side cannula.  At this point the injection was stopped.  The outer cannulas were then removed after placing the trocar of the Jamshidi needle back into position.  The incisions  were then closed with a singular 4-0 Vicryl suture.  Blood loss for the procedure was nil and the patient tolerated procedure well was returned to recovery room stable condition. ?

## 2021-08-02 NOTE — Discharge Summary (Signed)
Physician Discharge Summary  ?Patient ID: ?[name] ?MRN: [id number] ?DOB/AGE: [birth year] 88 y.o. ? ?Admit date: 08/02/2021 ?Discharge date: 08/02/2021 ? ?Admission Diagnoses: T12 compression fracture with delayed healing ? ?Discharge Diagnoses: T12 compression fracture with delayed healing ?Active Problems: ?  * No active hospital problems. * ? ? ?Discharged Condition: good ? ?Hospital Course: Patient was admitted to undergo an acrylic balloon kyphoplasty which she tolerated well. ? ?Consults: None ? ?Significant Diagnostic Studies: None ? ?Treatments: surgery: See op note ? ?Discharge Exam: ?Blood pressure (!) 169/70, pulse 85, temperature 97.6 ?F (36.4 ?C), resp. rate 16, height '5\' 1"'$  (1.549 m), weight 61.2 kg, SpO2 96 %. ?Exam normal ? ?Disposition:  ? ?Discharge Instructions   ? ? Incentive spirometry RT   Complete by: As directed ?  ? ?  ? ?Allergies as of 08/02/2021   ?No Known Allergies ?  ? ?  ?Medication List  ?  ? ?TAKE these medications   ? ?ALPRAZolam 0.5 MG tablet ?Commonly known as: Duanne Moron ?Take 0.25 mg by mouth at bedtime as needed (restless legs). ?  ?amLODipine 5 MG tablet ?Commonly known as: NORVASC ?Take 5 mg by mouth daily. ?  ?amoxicillin 500 MG capsule ?Commonly known as: AMOXIL ?Take 2,000 mg by mouth as needed (dental). Take 4 capsules (2000 mg) by mouth one hour prior to dental appointments ?  ?Benefiber Powd ?Take 1 daily dose ?What changed:  ?how much to take ?how to take this ?when to take this ?  ?CALCIUM-D PO ?Take 1 tablet by mouth daily. ?  ?clopidogrel 75 MG tablet ?Commonly known as: PLAVIX ?Take 1 tablet (75 mg total) by mouth daily. ?  ?dexlansoprazole 60 MG capsule ?Commonly known as: DEXILANT ?Take 60 mg by mouth every morning. ?  ?escitalopram 10 MG tablet ?Commonly known as: LEXAPRO ?Take 10 mg by mouth at bedtime. ?  ?HYDROcodone-acetaminophen 5-325 MG tablet ?Commonly known as: NORCO/VICODIN ?Take 1 tablet by mouth every 4 (four) hours as needed for moderate pain. ?   ?levothyroxine 25 MCG tablet ?Commonly known as: SYNTHROID ?Take 25 mcg by mouth daily before breakfast. ?  ?metoprolol succinate 25 MG 24 hr tablet ?Commonly known as: TOPROL-XL ?Take 25 mg by mouth every morning. ?  ?multivitamin with minerals Tabs tablet ?Take 1 tablet by mouth daily. ?  ?NONFORMULARY OR COMPOUNDED ITEM ?Apply 1 application topically See admin instructions. Transdermal therapeutics  meloxicam 0.5%, doxepin 3%, amantadine 3%, dextromethorphan 2%, lidocaine 2%. - compounded at [place]:  ?Apply topically to legs daily after showering for neuropathy ?  ?REFRESH OP ?Place 1 drop into both eyes daily as needed (dry eyes). ?  ?rosuvastatin 20 MG tablet ?Commonly known as: CRESTOR ?Take 1 tablet (20 mg total) by mouth daily. ?  ?triamterene-hydrochlorothiazide 37.5-25 MG tablet ?Commonly known as: MAXZIDE-25 ?Take 1 tablet by mouth every morning. Hold this medication for now, please see your pcp for hospital discharge follow up, repeat sodium level, please discussion resumption or discontinuation this meds ?What changed: additional instructions ?  ?VITAMIN C PO ?Take 1 tablet by mouth daily. ?  ?Vitamin D (Ergocalciferol) 1.25 MG (50000 UNIT) Caps capsule ?Commonly known as: DRISDOL ?Take 50,000 Units by mouth every Friday. ?  ?VITAMIN E PO ?Take 1 capsule by mouth daily. ?  ?zolpidem 12.5 MG CR tablet ?Commonly known as: AMBIEN CR ?Take 12.5 mg by mouth at bedtime. ?  ? ?  ? ? ? ?Signed: ?Earleen Newport ?08/02/2021, 3:43 PM ? ? ?

## 2021-08-02 NOTE — Anesthesia Procedure Notes (Signed)
Procedure Name: Intubation ?Date/Time: 08/02/2021 2:07 PM ?Performed by: Moshe Salisbury, CRNA ?Pre-anesthesia Checklist: Patient identified, Emergency Drugs available, Suction available and Patient being monitored ?Patient Re-evaluated:Patient Re-evaluated prior to induction ?Oxygen Delivery Method: Circle System Utilized ?Preoxygenation: Pre-oxygenation with 100% oxygen ?Induction Type: IV induction ?Ventilation: Mask ventilation without difficulty ?Laryngoscope Size: Mac and 3 ?Grade View: Grade I ?Tube type: Oral ?Tube size: 7.5 mm ?Number of attempts: 1 ?Airway Equipment and Method: Stylet ?Placement Confirmation: ETT inserted through vocal cords under direct vision, positive ETCO2 and breath sounds checked- equal and bilateral ?Secured at: 21 cm ?Tube secured with: Tape ?Dental Injury: Teeth and Oropharynx as per pre-operative assessment  ? ? ? ? ?

## 2021-08-03 ENCOUNTER — Encounter (HOSPITAL_COMMUNITY): Payer: Self-pay | Admitting: Neurological Surgery

## 2021-08-04 DIAGNOSIS — R410 Disorientation, unspecified: Secondary | ICD-10-CM | POA: Diagnosis not present

## 2021-08-04 DIAGNOSIS — I10 Essential (primary) hypertension: Secondary | ICD-10-CM | POA: Diagnosis not present

## 2021-08-04 DIAGNOSIS — I2510 Atherosclerotic heart disease of native coronary artery without angina pectoris: Secondary | ICD-10-CM | POA: Diagnosis not present

## 2021-08-04 DIAGNOSIS — G629 Polyneuropathy, unspecified: Secondary | ICD-10-CM | POA: Diagnosis not present

## 2021-08-04 DIAGNOSIS — I69334 Monoplegia of upper limb following cerebral infarction affecting left non-dominant side: Secondary | ICD-10-CM | POA: Diagnosis not present

## 2021-08-04 DIAGNOSIS — M48062 Spinal stenosis, lumbar region with neurogenic claudication: Secondary | ICD-10-CM | POA: Diagnosis not present

## 2021-08-09 DIAGNOSIS — M48062 Spinal stenosis, lumbar region with neurogenic claudication: Secondary | ICD-10-CM | POA: Diagnosis not present

## 2021-08-09 DIAGNOSIS — I69334 Monoplegia of upper limb following cerebral infarction affecting left non-dominant side: Secondary | ICD-10-CM | POA: Diagnosis not present

## 2021-08-09 DIAGNOSIS — I2510 Atherosclerotic heart disease of native coronary artery without angina pectoris: Secondary | ICD-10-CM | POA: Diagnosis not present

## 2021-08-09 DIAGNOSIS — G629 Polyneuropathy, unspecified: Secondary | ICD-10-CM | POA: Diagnosis not present

## 2021-08-09 DIAGNOSIS — I10 Essential (primary) hypertension: Secondary | ICD-10-CM | POA: Diagnosis not present

## 2021-08-09 DIAGNOSIS — R410 Disorientation, unspecified: Secondary | ICD-10-CM | POA: Diagnosis not present

## 2021-08-17 DIAGNOSIS — H903 Sensorineural hearing loss, bilateral: Secondary | ICD-10-CM | POA: Diagnosis not present

## 2021-08-17 DIAGNOSIS — H838X3 Other specified diseases of inner ear, bilateral: Secondary | ICD-10-CM | POA: Diagnosis not present

## 2021-08-18 ENCOUNTER — Encounter: Payer: Self-pay | Admitting: Neurology

## 2021-08-18 ENCOUNTER — Ambulatory Visit (INDEPENDENT_AMBULATORY_CARE_PROVIDER_SITE_OTHER): Payer: Medicare Other | Admitting: Neurology

## 2021-08-18 VITALS — BP 122/84 | HR 84 | Ht 60.0 in | Wt 134.0 lb

## 2021-08-18 DIAGNOSIS — R296 Repeated falls: Secondary | ICD-10-CM | POA: Diagnosis not present

## 2021-08-18 DIAGNOSIS — R29898 Other symptoms and signs involving the musculoskeletal system: Secondary | ICD-10-CM | POA: Diagnosis not present

## 2021-08-18 DIAGNOSIS — I2510 Atherosclerotic heart disease of native coronary artery without angina pectoris: Secondary | ICD-10-CM | POA: Diagnosis not present

## 2021-08-18 DIAGNOSIS — I63421 Cerebral infarction due to embolism of right anterior cerebral artery: Secondary | ICD-10-CM | POA: Diagnosis not present

## 2021-08-18 DIAGNOSIS — G629 Polyneuropathy, unspecified: Secondary | ICD-10-CM | POA: Diagnosis not present

## 2021-08-18 NOTE — Patient Instructions (Signed)
I had a long d/w patient about her recent stroke, risk for recurrent stroke/TIAs, personally independently reviewed imaging studies and stroke evaluation results and answered questions.Continue clopidogrel 75 mg daily  for secondary stroke prevention and maintain strict control of hypertension with blood pressure goal below 130/90, diabetes with hemoglobin A1c goal below 6.5% and lipids with LDL cholesterol goal below 70 mg/dL. I also advised the patient to eat a healthy diet with plenty of whole grains, cereals, fruits and vegetables, exercise regularly and maintain ideal body weight .Marland KitchenContinue ongoing plans for loop recorder placement as per her cardiologist.  We also talked about her frequent falls which are likely multifactorial due to combination of underlying chronic neuropathy, degenerative spine disease and recent stroke.  We discussed fall prevention precautions.  She was advised to use a cane  while walking outdoors and long distances.  .Follow up in the future with my nurse practitioner in 6 months or call earlier if necessary. ? ?Fall Prevention in the Home, Adult ?Falls can cause injuries and can happen to people of all ages. There are many things you can do to make your home safe and to help prevent falls. Ask for help when making these changes. ?What actions can I take to prevent falls? ?General Instructions ?Use good lighting in all rooms. Replace any light bulbs that burn out. ?Turn on the lights in dark areas. Use night-lights. ?Keep items that you use often in easy-to-reach places. Lower the shelves around your home if needed. ?Set up your furniture so you have a clear path. Avoid moving your furniture around. ?Do not have throw rugs or other things on the floor that can make you trip. ?Avoid walking on wet floors. ?If any of your floors are uneven, fix them. ?Add color or contrast paint or tape to clearly mark and help you see: ?Grab bars or handrails. ?First and last steps of staircases. ?Where  the edge of each step is. ?If you use a stepladder: ?Make sure that it is fully opened. Do not climb a closed stepladder. ?Make sure the sides of the stepladder are locked in place. ?Ask someone to hold the stepladder while you use it. ?Know where your pets are when moving through your home. ?What can I do in the bathroom? ?  ?Keep the floor dry. Clean up any water on the floor right away. ?Remove soap buildup in the tub or shower. ?Use nonskid mats or decals on the floor of the tub or shower. ?Attach bath mats securely with double-sided, nonslip rug tape. ?If you need to sit down in the shower, use a plastic, nonslip stool. ?Install grab bars by the toilet and in the tub and shower. Do not use towel bars as grab bars. ?What can I do in the bedroom? ?Make sure that you have a light by your bed that is easy to reach. ?Do not use any sheets or blankets for your bed that hang to the floor. ?Have a firm chair with side arms that you can use for support when you get dressed. ?What can I do in the kitchen? ?Clean up any spills right away. ?If you need to reach something above you, use a step stool with a grab bar. ?Keep electrical cords out of the way. ?Do not use floor polish or wax that makes floors slippery. ?What can I do with my stairs? ?Do not leave any items on the stairs. ?Make sure that you have a light switch at the top and the bottom of the  stairs. ?Make sure that there are handrails on both sides of the stairs. Fix handrails that are broken or loose. ?Install nonslip stair treads on all your stairs. ?Avoid having throw rugs at the top or bottom of the stairs. ?Choose a carpet that does not hide the edge of the steps on the stairs. ?Check carpeting to make sure that it is firmly attached to the stairs. Fix carpet that is loose or worn. ?What can I do on the outside of my home? ?Use bright outdoor lighting. ?Fix the edges of walkways and driveways and fix any cracks. ?Remove anything that might make you trip as  you walk through a door, such as a raised step or threshold. ?Trim any bushes or trees on paths to your home. ?Check to see if handrails are loose or broken and that both sides of all steps have handrails. ?Install guardrails along the edges of any raised decks and porches. ?Clear paths of anything that can make you trip, such as tools or rocks. ?Have leaves, snow, or ice cleared regularly. ?Use sand or salt on paths during winter. ?Clean up any spills in your garage right away. This includes grease or oil spills. ?What other actions can I take? ?Wear shoes that: ?Have a low heel. Do not wear high heels. ?Have rubber bottoms. ?Feel good on your feet and fit well. ?Are closed at the toe. Do not wear open-toe sandals. ?Use tools that help you move around if needed. These include: ?Canes. ?Walkers. ?Scooters. ?Crutches. ?Review your medicines with your doctor. Some medicines can make you feel dizzy. This can increase your chance of falling. ?Ask your doctor what else you can do to help prevent falls. ?Where to find more information ?Centers for Disease Control and Prevention, STEADI: http://www.wolf.info/ ?[employer] on Aging: http://kim-miller.com/ ?Contact a doctor if: ?You are afraid of falling at home. ?You feel weak, drowsy, or dizzy at home. ?You fall at home. ?Summary ?There are many simple things that you can do to make your home safe and to help prevent falls. ?Ways to make your home safe include removing things that can make you trip and installing grab bars in the bathroom. ?Ask for help when making these changes in your home. ?This information is not intended to replace advice given to you by your health care provider. Make sure you discuss any questions you have with your health care provider. ?Document Revised: 12/18/2019 Document Reviewed: 12/18/2019 ?Elsevier Patient Education ? 2022 [place]. ? ? ?Stroke Prevention ?Some medical conditions and behaviors can lead to a higher chance of having a stroke. You  can help prevent a stroke by eating healthy, exercising, not smoking, and managing any medical conditions you have. ?Stroke is a leading cause of functional impairment. Primary prevention is particularly important because a majority of strokes are first-time events. Stroke changes the lives of not only those who experience a stroke but also their family and other caregivers. ?How can this condition affect me? ?A stroke is a medical emergency and should be treated right away. A stroke can lead to brain damage and can sometimes be life-threatening. If a person gets medical treatment right away, there is a better chance of surviving and recovering from a stroke. ?What can increase my risk? ?The following medical conditions may increase your risk of a stroke: ?Cardiovascular disease. ?High blood pressure (hypertension). ?Diabetes. ?High cholesterol. ?Sickle cell disease. ?Blood clotting disorders (hypercoagulable state). ?Obesity. ?Sleep disorders (obstructive sleep apnea). ?Other risk factors include: ?Being older than age  18. ?Having a history of blood clots, stroke, or mini-stroke (transient ischemic attack, TIA). ?Genetic factors, such as race, ethnicity, or a family history of stroke. ?Smoking cigarettes or using other tobacco products. ?Taking birth control pills, especially if you also use tobacco. ?Heavy use of alcohol or drugs, especially cocaine and methamphetamine. ?Physical inactivity. ?What actions can I take to prevent this? ?Manage your health conditions ?High cholesterol levels. ?Eating a healthy diet is important for preventing high cholesterol. If cholesterol cannot be managed through diet alone, you may need to take medicines. ?Take any prescribed medicines to control your cholesterol as told by your health care provider. ?Hypertension. ?To reduce your risk of stroke, try to keep your blood pressure below 130/80. ?Eating a healthy diet and exercising regularly are important for controlling blood  pressure. If these steps are not enough to manage your blood pressure, you may need to take medicines. ?Take any prescribed medicines to control hypertension as told by your health care provider. ?Ask your h

## 2021-08-18 NOTE — Progress Notes (Signed)
?[facility] ?[address] ?[place] [id number] ?[phone] ? ?     OFFICE CONSULT NOTE ? ?[name] ?Date of Birth:  03-04-1934 ?Medical Record Number:  [id number]  ? ?Referring MD: Dr. Quinn Axe ? ?Reason for Referral: Stroke ? ?HPI: [name] is a 88 year old pleasant Caucasian lady seen today for office consultation visit for stroke.  History is obtained from the patient and review of electronic medical records and opossum reviewed pertinent available imaging films in PACS.  She  has a past medical history of Arthritis, Complication of anesthesia, Coronary artery disease, DI (detrusor instability), Fracture of vertebra, GERD (gastroesophageal reflux disease), History of hiatal hernia, Hypertension, Hypothyroidism, Menopausal symptoms, MGUS (monoclonal gammopathy of unknown significance) (12/27/2017), Osteoporosis, Peripheral neuropathy (12/27/2017), Peripheral neuropathy, Pneumonia, Restless leg syndrome, and Varicose veins.  She presented to [facility] emergency room on 04/19/2022 for evaluation of left lower extremity weakness.  This gradually worsened over the last 2 days.  She was barely able to walk on the day of presentation and unable to lift the leg off the bed when she tried to stand she fell.  MRI scan of the brain was obtained which showed multiple small acute infarcts in right greater than left cerebral hemispheres and also involving right cerebellum and multiple vascular territories concerning for central embolic process.  CT angiogram of the brain and neck showed no large vessel stenosis or occlusion but there was beaded appearance of both terminal carotids raising concern for fibromuscular dysplasia.  Echocardiogram showed ejection fraction of 60 to 65%.  Left atrial size was not dilated.  LDL cholesterol was 63 mg percent and hemoglobin A1c was 5.3.  She subsequently had a 30-day heart monitor done and no paroxysmal A-fib was found.  Patient was discharged home and  underwent home physical and occupational therapy for for 3 months and is able to ambulate well now.  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  She can walk short distances unassisted and uses a cane for outdoors and long distances.  The patient did have a fall in her bathtub and fractured some ribs.  She was seen in December 2022 and found to have significant right-sided hemorrhagic pleural effusion and 1.2 L but after radiology.  She was also found to have T12 compression fracture and she underwent acrylic balloon kyphoplasty by Dr. Ellene Route on 08/02/2021 for relief of her back pain which has been refractory to medical therapies.  She states her back pain is much better now and she can ambulate quite well.  She has a longstanding history of peripheral neuropathy with hypersensitivity in the stocking distribution and poor balance.  She has seen Dr. Jannifer Franklin for the same.  She states her neuropathy symptoms are unchanged.  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 ? ?ROS:   ?14 system review of systems is positive for leg weakness, difficulty walking, imbalance, back pain, fall neuro systems negative ? ?PMH:  ?Past Medical History:  ?Diagnosis Date  ? Anxiety   ? Arthritis   ? Chronic headaches   ? hx - now just occasional HA per patient  ? Colon polyps   ? Complication of anesthesia   ? Elevated blood pressure after having last Kyphoplasty; pt stated "I was given Morphine and had to stay overnight"  ? Coronary artery disease   ? Depression   ? DI (detrusor instability)   ? Fracture of vertebra   ?  x 3  ? GERD (gastroesophageal reflux disease)   ? History of blood transfusion 2011  ? w/ knee replacement surgery  ? History of hiatal hernia   ? small - does not cause any problems per patient  ? HLD (hyperlipidemia)   ? Hypertension   ? Hypothyroidism   ? Menopausal symptoms   ? MGUS (monoclonal gammopathy of unknown significance) 12/27/2017  ? IgA  ?  Osteoporosis   ? Peripheral neuropathy 12/27/2017  ? Pneumonia   ? Restless leg syndrome   ? Stroke [facility]) 03/2021  ? mini strokes  ? TMJ click   ? Varicose veins   ? ? ?Social History:  ?Social History  ? ?Socioeconomic History  ? Marital status: Widowed  ?  Spouse name: Not on file  ? Number of children: 2  ? Years of education: 72  ? Highest education level: Not on file  ?Occupational History  ? Occupation: Retired  ?Tobacco Use  ? Smoking status: Never  ? Smokeless tobacco: Never  ?Vaping Use  ? Vaping Use: Never used  ?Substance and Sexual Activity  ? Alcohol use: No  ? Drug use: No  ? Sexual activity: Yes  ?  Birth control/protection: Surgical  ?  Comment: Hysterectomy  ?Other Topics Concern  ? Not on file  ?Social History Narrative  ? Lives  alone  ? Caffeine use: decaf only  ? Right handed   ? ?Social Determinants of Health  ? ?Financial Resource Strain: Not on file  ?Food Insecurity: Not on file  ?Transportation Needs: Not on file  ?Physical Activity: Not on file  ?Stress: Not on file  ?Social Connections: Not on file  ?Intimate Partner Violence: Not on file  ? ? ?Medications:   ?Current Outpatient Medications on File Prior to Visit  ?Medication Sig Dispense Refill  ? ALPRAZolam (XANAX) 0.5 MG tablet Take 0.25 mg by mouth at bedtime as needed (restless legs).    ? amLODipine (NORVASC) 5 MG tablet Take 5 mg by mouth daily.    ? amoxicillin (AMOXIL) 500 MG capsule Take 2,000 mg by mouth as needed (dental). Take 4 capsules (2000 mg) by mouth one hour prior to dental appointments    ? Ascorbic Acid (VITAMIN C PO) Take 1 tablet by mouth daily.    ? Calcium Carbonate-Vitamin D (CALCIUM-D PO) Take 1 tablet by mouth daily.    ? clopidogrel (PLAVIX) 75 MG tablet Take 1 tablet (75 mg total) by mouth daily. 30 tablet 3  ? dexlansoprazole (DEXILANT) 60 MG capsule Take 60 mg by mouth every morning.    ? escitalopram (LEXAPRO) 10 MG tablet Take 10 mg by mouth at bedtime.    ? HYDROcodone-acetaminophen (NORCO/VICODIN)  5-325 MG tablet Take 1 tablet by mouth every 4 (four) hours as needed for moderate pain. 20 tablet 0  ? levothyroxine (SYNTHROID, LEVOTHROID) 25 MCG tablet Take 25 mcg by mouth daily before breakfast.     ? metoprolol succinate (TOPROL-XL) 25 MG 24 hr tablet Take 25 mg by mouth every morning.    ? Multiple Vitamin (MULTIVITAMIN WITH MINERALS) TABS tablet Take 1 tablet by mouth daily.    ? NONFORMULARY OR COMPOUNDED ITEM Apply 1 application topically See admin instructions. Transdermal therapeutics  meloxicam 0.5%, doxepin 3%, amantadine 3%, dextromethorphan 2%, lidocaine 2%. - compounded at [place]:  ?Apply topically to legs daily after showering for neuropathy    ? Polyvinyl Alcohol-Povidone (REFRESH OP) Place 1 drop into both eyes daily as needed (dry eyes).    ?  rosuvastatin (CRESTOR) 20 MG tablet Take 1 tablet (20 mg total) by mouth daily. 30 tablet 0  ? triamterene-hydrochlorothiazide (MAXZIDE-25) 37.5-25 MG tablet Take 1 tablet by mouth every morning. Hold this medication for now, please see your pcp for hospital discharge follow up, repeat sodium level, please discussion resumption or discontinuation this meds (Patient taking differently: Take 1 tablet by mouth every morning.)    ? Vitamin D, Ergocalciferol, (DRISDOL) 50000 UNITS CAPS Take 50,000 Units by mouth every Friday.    ? VITAMIN E PO Take 1 capsule by mouth daily.    ? Wheat Dextrin (BENEFIBER) POWD Take 1 daily dose (Patient taking differently: Take 1 Scoop by mouth daily. Take 1 daily dose) 500 g 0  ? zolpidem (AMBIEN CR) 12.5 MG CR tablet Take 12.5 mg by mouth at bedtime.    ? ?No current facility-administered medications on file prior to visit.  ? ? ?Allergies:  No Known Allergies ? ?Physical Exam ?General: well developed, well nourished, seated, in no evident distress ?Head: head normocephalic and atraumatic.   ?Neck: supple with no carotid or supraclavicular bruits ?Cardiovascular: regular rate and rhythm, no  murmurs ?Musculoskeletal: no deformity right foot drop ?Skin:  no rash/petichiae ?Vascular:  Normal pulses all extremities ? ?Neurologic Exam ?Mental Status: Awake and fully alert. Oriented to place and time. Recent and remot

## 2021-08-20 DIAGNOSIS — M48062 Spinal stenosis, lumbar region with neurogenic claudication: Secondary | ICD-10-CM | POA: Diagnosis not present

## 2021-08-20 DIAGNOSIS — I10 Essential (primary) hypertension: Secondary | ICD-10-CM | POA: Diagnosis not present

## 2021-08-20 DIAGNOSIS — S22080A Wedge compression fracture of T11-T12 vertebra, initial encounter for closed fracture: Secondary | ICD-10-CM | POA: Diagnosis not present

## 2021-08-20 DIAGNOSIS — G629 Polyneuropathy, unspecified: Secondary | ICD-10-CM | POA: Diagnosis not present

## 2021-08-20 DIAGNOSIS — I2510 Atherosclerotic heart disease of native coronary artery without angina pectoris: Secondary | ICD-10-CM | POA: Diagnosis not present

## 2021-08-20 DIAGNOSIS — R410 Disorientation, unspecified: Secondary | ICD-10-CM | POA: Diagnosis not present

## 2021-08-20 DIAGNOSIS — Z6825 Body mass index (BMI) 25.0-25.9, adult: Secondary | ICD-10-CM | POA: Diagnosis not present

## 2021-08-20 DIAGNOSIS — I69334 Monoplegia of upper limb following cerebral infarction affecting left non-dominant side: Secondary | ICD-10-CM | POA: Diagnosis not present

## 2021-08-22 DIAGNOSIS — I10 Essential (primary) hypertension: Secondary | ICD-10-CM | POA: Diagnosis not present

## 2021-08-22 DIAGNOSIS — Z7902 Long term (current) use of antithrombotics/antiplatelets: Secondary | ICD-10-CM | POA: Diagnosis not present

## 2021-08-22 DIAGNOSIS — M810 Age-related osteoporosis without current pathological fracture: Secondary | ICD-10-CM | POA: Diagnosis not present

## 2021-08-22 DIAGNOSIS — K219 Gastro-esophageal reflux disease without esophagitis: Secondary | ICD-10-CM | POA: Diagnosis not present

## 2021-08-22 DIAGNOSIS — G2581 Restless legs syndrome: Secondary | ICD-10-CM | POA: Diagnosis not present

## 2021-08-22 DIAGNOSIS — E876 Hypokalemia: Secondary | ICD-10-CM | POA: Diagnosis not present

## 2021-08-22 DIAGNOSIS — G629 Polyneuropathy, unspecified: Secondary | ICD-10-CM | POA: Diagnosis not present

## 2021-08-22 DIAGNOSIS — I69334 Monoplegia of upper limb following cerebral infarction affecting left non-dominant side: Secondary | ICD-10-CM | POA: Diagnosis not present

## 2021-08-22 DIAGNOSIS — Z9181 History of falling: Secondary | ICD-10-CM | POA: Diagnosis not present

## 2021-08-22 DIAGNOSIS — Z87310 Personal history of (healed) osteoporosis fracture: Secondary | ICD-10-CM | POA: Diagnosis not present

## 2021-08-22 DIAGNOSIS — E785 Hyperlipidemia, unspecified: Secondary | ICD-10-CM | POA: Diagnosis not present

## 2021-08-22 DIAGNOSIS — F419 Anxiety disorder, unspecified: Secondary | ICD-10-CM | POA: Diagnosis not present

## 2021-08-22 DIAGNOSIS — D472 Monoclonal gammopathy: Secondary | ICD-10-CM | POA: Diagnosis not present

## 2021-08-22 DIAGNOSIS — I2510 Atherosclerotic heart disease of native coronary artery without angina pectoris: Secondary | ICD-10-CM | POA: Diagnosis not present

## 2021-08-22 DIAGNOSIS — M1990 Unspecified osteoarthritis, unspecified site: Secondary | ICD-10-CM | POA: Diagnosis not present

## 2021-08-22 DIAGNOSIS — E039 Hypothyroidism, unspecified: Secondary | ICD-10-CM | POA: Diagnosis not present

## 2021-08-22 DIAGNOSIS — M48062 Spinal stenosis, lumbar region with neurogenic claudication: Secondary | ICD-10-CM | POA: Diagnosis not present

## 2021-08-22 DIAGNOSIS — E871 Hypo-osmolality and hyponatremia: Secondary | ICD-10-CM | POA: Diagnosis not present

## 2021-08-26 ENCOUNTER — Telehealth: Payer: Self-pay | Admitting: Interventional Cardiology

## 2021-08-26 DIAGNOSIS — I69334 Monoplegia of upper limb following cerebral infarction affecting left non-dominant side: Secondary | ICD-10-CM | POA: Diagnosis not present

## 2021-08-26 DIAGNOSIS — M48062 Spinal stenosis, lumbar region with neurogenic claudication: Secondary | ICD-10-CM | POA: Diagnosis not present

## 2021-08-26 DIAGNOSIS — I2510 Atherosclerotic heart disease of native coronary artery without angina pectoris: Secondary | ICD-10-CM | POA: Diagnosis not present

## 2021-08-26 DIAGNOSIS — E039 Hypothyroidism, unspecified: Secondary | ICD-10-CM | POA: Diagnosis not present

## 2021-08-26 DIAGNOSIS — I10 Essential (primary) hypertension: Secondary | ICD-10-CM | POA: Diagnosis not present

## 2021-08-26 DIAGNOSIS — G629 Polyneuropathy, unspecified: Secondary | ICD-10-CM | POA: Diagnosis not present

## 2021-08-26 NOTE — Telephone Encounter (Signed)
Advised the patient that she can eat/drink and take all of her medications as normal, no need to hold Plavix. For dermatology the day before okay to continue with appt as planned. IF she needs any freezing or burning just to avoid the left chest area which is where the implant will go. Patient verbalized understanding.  ?

## 2021-08-26 NOTE — Telephone Encounter (Signed)
Pt would like to know when she should stop her blood thinners prior to her procedure. Pt also states that prior to this procedure she has an appt scheduled with another Dr. Parks Ranger will be freezing certain pieces of skin on her body that look cancerous.. pt would like to know if she is able to keep this appt or if she should skip it... please advise  ?

## 2021-08-26 NOTE — Telephone Encounter (Signed)
Pt states she reached out to Neurologist about holding Plavix prior to ILR insertion.  They told her to call our office and see if/how long it needs to be held.   ? ?She also inquired if it is ok for her to keep her dermatology appt on 4/6 or should she move it?  She goes every 6 months for a "body check" and they typically burn or freeze some areas if they feel they are pre cancerous.  This leaves blisters on her.  She's ok with moving it if she needs to. ? ?Advised I will send this information to Dr. Quentin Ore and his nurse and we would call back with his recommendations as soon as we had them.  ?

## 2021-09-01 DIAGNOSIS — I10 Essential (primary) hypertension: Secondary | ICD-10-CM | POA: Diagnosis not present

## 2021-09-01 DIAGNOSIS — M48062 Spinal stenosis, lumbar region with neurogenic claudication: Secondary | ICD-10-CM | POA: Diagnosis not present

## 2021-09-01 DIAGNOSIS — G629 Polyneuropathy, unspecified: Secondary | ICD-10-CM | POA: Diagnosis not present

## 2021-09-01 DIAGNOSIS — I69334 Monoplegia of upper limb following cerebral infarction affecting left non-dominant side: Secondary | ICD-10-CM | POA: Diagnosis not present

## 2021-09-01 DIAGNOSIS — I2510 Atherosclerotic heart disease of native coronary artery without angina pectoris: Secondary | ICD-10-CM | POA: Diagnosis not present

## 2021-09-01 DIAGNOSIS — E039 Hypothyroidism, unspecified: Secondary | ICD-10-CM | POA: Diagnosis not present

## 2021-09-03 ENCOUNTER — Encounter: Payer: Self-pay | Admitting: Cardiology

## 2021-09-03 ENCOUNTER — Ambulatory Visit (INDEPENDENT_AMBULATORY_CARE_PROVIDER_SITE_OTHER): Payer: Medicare Other | Admitting: Cardiology

## 2021-09-03 VITALS — BP 128/64 | HR 76 | Ht 60.0 in | Wt 134.0 lb

## 2021-09-03 DIAGNOSIS — I493 Ventricular premature depolarization: Secondary | ICD-10-CM

## 2021-09-03 DIAGNOSIS — I639 Cerebral infarction, unspecified: Secondary | ICD-10-CM

## 2021-09-03 NOTE — Progress Notes (Deleted)
04/19/2022 to [facility] for LLE weakness. MRI showed acute infarcts ?Heart monitor 07/06/2021 - no af detected ? ?Here for ILR ? ?

## 2021-09-03 NOTE — Patient Instructions (Signed)
Medication Instructions:  ?Your physician recommends that you continue on your current medications as directed. Please refer to the Current Medication list given to you today. ? ?Labwork: ?None ordered. ? ?Testing/Procedures: ?None ordered. ? ?Follow-Up: ? ?Your physician wants you to follow-up in: As needed with Lars Mage. He will follow your device remotely.  ? ? ? ?Implantable Loop Recorder Placement, Care After ?This sheet gives you information about how to care for yourself after your procedure. Your health care provider may also give you more specific instructions. If you have problems or questions, contact your health care provider. ?What can I expect after the procedure? ?After the procedure, it is common to have: ?Soreness or discomfort near the incision. ?Some swelling or bruising near the incision. ? ?Follow these instructions at home: ?Incision care ? ? Leave your outer dressing on for 72 hours.  After 72 hours you can remove your outer dressing and shower. ?Leave adhesive strips in place. These skin closures may need to stay in place for 1-2 weeks. If adhesive strip edges start to loosen and curl up, you may trim the loose edges.  You may remove the strips if they have not fallen off after 2 weeks. ?Check your incision area every day for signs of infection. Check for: ?Redness, swelling, or pain. ?Fluid or blood. ?Warmth. ?Pus or a bad smell. ?Do not take baths, swim, or use a hot tub until your incision is completely healed. ?If your wound site starts to bleed apply pressure.   ?   ?If you have any questions/concerns please call the device clinic at [phone]. ? ?Activity ? ?Return to your normal activities. ? ?General instructions ?Follow instructions from your health care provider about how to manage your implantable loop recorder and transmit the information. Learn how to activate a recording if this is necessary for your type of device. ?You may go through a metal detection gate, and you  may let someone hold a metal detector over your chest. Show your ID card if needed. ?Do not have an MRI unless you check with your health care provider first. ?Take over-the-counter and prescription medicines only as told by your health care provider. ?Keep all follow-up visits as told by your health care provider. This is important. ?Contact a health care provider if: ?You have redness, swelling, or pain around your incision. ?You have a fever. ?You have pain that is not relieved by your pain medicine. ?You have triggered your device because of fainting (syncope) or because of a heartbeat that feels like it is racing, slow, fluttering, or skipping (palpitations). ?Get help right away if you have: ?Chest pain. ?Difficulty breathing. ?Summary ?After the procedure, it is common to have soreness or discomfort near the incision. ?Change your dressing as told by your health care provider. ?Follow instructions from your health care provider about how to manage your implantable loop recorder and transmit the information. ?Keep all follow-up visits as told by your health care provider. This is important. ?This information is not intended to replace advice given to you by your health care provider. Make sure you discuss any questions you have with your health care provider. ?Document Released: 04/27/2015 Document Revised: 07/01/2017 Document Reviewed: 07/01/2017 ?Elsevier Patient Education ? [place].  ? ? ? ? ?  ? ? ?

## 2021-09-03 NOTE — Progress Notes (Signed)
?Electrophysiology Office Note:   ? ?Date:  09/03/2021  ? ?ID:  [name], DOB Oct 24, 1933, MRN [id number] ? ?PCP:  Burnard Bunting, MD  ?[facility] Cardiologist:  Sinclair Grooms, MD  ?[place] Electrophysiologist:  Yvonne Epley, MD  ? ?Referring MD: Yvonne Shi, PA-C  ? ?Chief Complaint: New patient consult for ILR ? ?History of Present Illness:   ? ?[name] is a 88 y.o. female who presents for a consult to discuss ILR at the request of [name], [place]. Their medical history includes CAD, hypertension, hyperlipidemia, hypothyroidism, stroke, and GERD. ? ?She is accompanied by her daughter. Overall, she appears well. ? ?Currently on Plavix. ? ?She denies any palpitations, chest pain, shortness of breath, or peripheral edema. No lightheadedness, headaches, syncope, orthopnea, or PND. ? ? ?  ?Past Medical History:  ?Diagnosis Date  ? Anxiety   ? Arthritis   ? Chronic headaches   ? hx - now just occasional HA per patient  ? Colon polyps   ? Complication of anesthesia   ? Elevated blood pressure after having last Kyphoplasty; pt stated "I was given Morphine and had to stay overnight"  ? Coronary artery disease   ? Depression   ? DI (detrusor instability)   ? Fracture of vertebra   ? x 3  ? GERD (gastroesophageal reflux disease)   ? History of blood transfusion 2011  ? w/ knee replacement surgery  ? History of hiatal hernia   ? small - does not cause any problems per patient  ? HLD (hyperlipidemia)   ? Hypertension   ? Hypothyroidism   ? Menopausal symptoms   ? MGUS (monoclonal gammopathy of unknown significance) 12/27/2017  ? IgA  ? Osteoporosis   ? Peripheral neuropathy 12/27/2017  ? Pneumonia   ? Restless leg syndrome   ? Stroke [facility]) 03/2021  ? mini strokes  ? TMJ click   ? Varicose veins   ? ? ?Past Surgical History:  ?Procedure Laterality Date  ? ABDOMINAL HYSTERECTOMY  1992  ? TAH,BSO  ? BLADDER SUSPENSION  2011  ? CRYOMESH  ? CARPAL TUNNEL RELEASE Bilateral 1982  ? CHOLECYSTECTOMY   1992  ? COLONOSCOPY    ? EYE SURGERY Bilateral   ? Cataract removal  ? IR THORACENTESIS ASP PLEURAL SPACE W/IMG GUIDE  05/28/2021  ? KYPHOPLASTY    ? X 3  ? KYPHOPLASTY N/A 08/02/2021  ? Procedure: Thoracic Twelve KYPHOPLASTY;  Surgeon: Yvonne Miss, MD;  Location: [place];  Service: Neurosurgery;  Laterality: N/A;  ? LUMBAR LAMINECTOMY WITH COFLEX 1 LEVEL N/A 12/29/2015  ? Procedure: L3-4 Laminectomy with Coflex;  Surgeon: Yvonne Miss, MD;  Location: [employer] [facility];  Service: Neurosurgery;  Laterality: N/A;  L3-4 Laminectomy with coflex  ? OOPHORECTOMY    ? BSO  ? REPLACEMENT TOTAL KNEE Bilateral 2008,  2011  ? RIGHT 2008, LEFT 2011  ? REVERSE SHOULDER ARTHROPLASTY Right 01/30/2020  ? Procedure: REVERSE SHOULDER ARTHROPLASTY;  Surgeon: Nicholes Stairs, MD;  Location: [facility];  Service: Orthopedics;  Laterality: Right;  2.5 hrs  ? SPINAL FUSION  2011  ? VERTEBRAL SURGERY    ? T-8, T-10. T-12  DR [name]  ? ? ?Current Medications: ?Current Meds  ?Medication Sig  ? ALPRAZolam (XANAX) 0.5 MG tablet Take 0.25 mg by mouth at bedtime as needed (restless legs).  ? amLODipine (NORVASC) 5 MG tablet Take 5 mg by mouth daily.  ? amoxicillin (AMOXIL) 500 MG capsule Take 2,000  mg by mouth as needed (dental). Take 4 capsules (2000 mg) by mouth one hour prior to dental appointments  ? Ascorbic Acid (VITAMIN C PO) Take 1 tablet by mouth daily.  ? Calcium Carbonate-Vitamin D (CALCIUM-D PO) Take 1 tablet by mouth daily.  ? clopidogrel (PLAVIX) 75 MG tablet Take 1 tablet (75 mg total) by mouth daily.  ? dexlansoprazole (DEXILANT) 60 MG capsule Take 60 mg by mouth every morning.  ? escitalopram (LEXAPRO) 10 MG tablet Take 10 mg by mouth at bedtime.  ? HYDROcodone-acetaminophen (NORCO/VICODIN) 5-325 MG tablet Take 1 tablet by mouth every 4 (four) hours as needed for moderate pain.  ? levothyroxine (SYNTHROID, LEVOTHROID) 25 MCG tablet Take 25 mcg by mouth daily before breakfast.   ? metoprolol succinate (TOPROL-XL) 25 MG 24 hr tablet  Take 25 mg by mouth every morning.  ? Multiple Vitamin (MULTIVITAMIN WITH MINERALS) TABS tablet Take 1 tablet by mouth daily.  ? NONFORMULARY OR COMPOUNDED ITEM Apply 1 application topically See admin instructions. Transdermal therapeutics  meloxicam 0.5%, doxepin 3%, amantadine 3%, dextromethorphan 2%, lidocaine 2%. - compounded at [place]:  ?Apply topically to legs daily after showering for neuropathy  ? Polyvinyl Alcohol-Povidone (REFRESH OP) Place 1 drop into both eyes daily as needed (dry eyes).  ? rosuvastatin (CRESTOR) 20 MG tablet Take 1 tablet (20 mg total) by mouth daily.  ? triamterene-hydrochlorothiazide (MAXZIDE-25) 37.5-25 MG tablet Take 1 tablet by mouth every morning. Hold this medication for now, please see your pcp for hospital discharge follow up, repeat sodium level, please discussion resumption or discontinuation this meds (Patient taking differently: Take 1 tablet by mouth every morning.)  ? Vitamin D, Ergocalciferol, (DRISDOL) 50000 UNITS CAPS Take 50,000 Units by mouth every Friday.  ? VITAMIN E PO Take 1 capsule by mouth daily.  ? Wheat Dextrin (BENEFIBER) POWD Take 1 daily dose  ? zolpidem (AMBIEN CR) 12.5 MG CR tablet Take 12.5 mg by mouth at bedtime.  ?  ? ?Allergies:   Patient has no known allergies.  ? ?Social History  ? ?Socioeconomic History  ? Marital status: Widowed  ?  Spouse name: Not on file  ? Number of children: 2  ? Years of education: 74  ? Highest education level: Not on file  ?Occupational History  ? Occupation: Retired  ?Tobacco Use  ? Smoking status: Never  ? Smokeless tobacco: Never  ?Vaping Use  ? Vaping Use: Never used  ?Substance and Sexual Activity  ? Alcohol use: No  ? Drug use: No  ? Sexual activity: Yes  ?  Birth control/protection: Surgical  ?  Comment: Hysterectomy  ?Other Topics Concern  ? Not on file  ?Social History Narrative  ? Lives  alone  ? Caffeine use: decaf only  ? Right handed   ? ?Social Determinants of Health  ? ?Financial Resource  Strain: Not on file  ?Food Insecurity: Not on file  ?Transportation Needs: Not on file  ?Physical Activity: Not on file  ?Stress: Not on file  ?Social Connections: Not on file  ?  ? ?Family History: ?The patient's family history includes Breast cancer in her niece; Diabetes in her sister and sister; Heart disease in her brother, brother, brother, brother, brother, brother, father, mother, and son; Hyperlipidemia in her daughter, sister, sister, and son; Hypertension in her brother, brother, daughter, sister, sister, and sister; Ovarian cancer in her sister; Rectal cancer in her sister; Stroke in her father. ? ?ROS:   ?Please see the history of present  illness.    ?All other systems reviewed and are negative. ? ?EKGs/Labs/Other Studies Reviewed:   ? ?The following studies were reviewed today: ? ?Monitor 2/02023: ?Normal sinus rhythm ?Frequent PVC's with 18% burden ?PAC's with 3% burden ?No sustained tachyarrhythmia ?No atrial fibrillation ?Some symptoms of flutter and heart racing correlate with PAC's and PVC's. ?  ?No atrial fibrillation ?High burden PVC's ? ?Echo 04/21/2021: ? 1. Left ventricular ejection fraction, by estimation, is 60 to 65%. The  ?left ventricle has normal function. The left ventricle has no regional  ?wall motion abnormalities. Left ventricular diastolic parameters are  ?consistent with Grade I diastolic  ?dysfunction (impaired relaxation).  ? 2. Right ventricular systolic function is normal. The right ventricular  ?size is normal. Tricuspid regurgitation signal is inadequate for assessing  ?PA pressure.  ? 3. The mitral valve is normal in structure. Trivial mitral valve  ?regurgitation. No evidence of mitral stenosis.  ? 4. The aortic valve is tricuspid. Aortic valve regurgitation is trivial.  ?No aortic stenosis is present.  ? ? ?EKG:   EKG is personally reviewed.  ?09/03/2021: sinus rhythm ? ? ?Recent Labs: ?04/21/2021: TSH 1.522 ?04/22/2021: Magnesium 2.2 ?07/22/2021: ALT 16; BUN 15; Creatinine  0.85; Hemoglobin 14.1; Platelet Count 319; Potassium 3.7; Sodium 134  ? ?Recent Lipid Panel ?   ?Component Value Date/Time  ? CHOL 172 07/22/2021 0934  ? TRIG 99 07/22/2021 0934  ? HDL 89 07/22/2021 0934

## 2021-09-07 DIAGNOSIS — E039 Hypothyroidism, unspecified: Secondary | ICD-10-CM | POA: Diagnosis not present

## 2021-09-07 DIAGNOSIS — M48062 Spinal stenosis, lumbar region with neurogenic claudication: Secondary | ICD-10-CM | POA: Diagnosis not present

## 2021-09-07 DIAGNOSIS — I10 Essential (primary) hypertension: Secondary | ICD-10-CM | POA: Diagnosis not present

## 2021-09-07 DIAGNOSIS — I69334 Monoplegia of upper limb following cerebral infarction affecting left non-dominant side: Secondary | ICD-10-CM | POA: Diagnosis not present

## 2021-09-07 DIAGNOSIS — I2510 Atherosclerotic heart disease of native coronary artery without angina pectoris: Secondary | ICD-10-CM | POA: Diagnosis not present

## 2021-09-07 DIAGNOSIS — G629 Polyneuropathy, unspecified: Secondary | ICD-10-CM | POA: Diagnosis not present

## 2021-09-16 DIAGNOSIS — M48062 Spinal stenosis, lumbar region with neurogenic claudication: Secondary | ICD-10-CM | POA: Diagnosis not present

## 2021-09-16 DIAGNOSIS — I69334 Monoplegia of upper limb following cerebral infarction affecting left non-dominant side: Secondary | ICD-10-CM | POA: Diagnosis not present

## 2021-09-16 DIAGNOSIS — E039 Hypothyroidism, unspecified: Secondary | ICD-10-CM | POA: Diagnosis not present

## 2021-09-16 DIAGNOSIS — G629 Polyneuropathy, unspecified: Secondary | ICD-10-CM | POA: Diagnosis not present

## 2021-09-16 DIAGNOSIS — I10 Essential (primary) hypertension: Secondary | ICD-10-CM | POA: Diagnosis not present

## 2021-09-16 DIAGNOSIS — I2510 Atherosclerotic heart disease of native coronary artery without angina pectoris: Secondary | ICD-10-CM | POA: Diagnosis not present

## 2021-09-20 DIAGNOSIS — I69334 Monoplegia of upper limb following cerebral infarction affecting left non-dominant side: Secondary | ICD-10-CM | POA: Diagnosis not present

## 2021-09-20 DIAGNOSIS — M48062 Spinal stenosis, lumbar region with neurogenic claudication: Secondary | ICD-10-CM | POA: Diagnosis not present

## 2021-09-20 DIAGNOSIS — G629 Polyneuropathy, unspecified: Secondary | ICD-10-CM | POA: Diagnosis not present

## 2021-09-20 DIAGNOSIS — E039 Hypothyroidism, unspecified: Secondary | ICD-10-CM | POA: Diagnosis not present

## 2021-09-20 DIAGNOSIS — I10 Essential (primary) hypertension: Secondary | ICD-10-CM | POA: Diagnosis not present

## 2021-09-20 DIAGNOSIS — I2510 Atherosclerotic heart disease of native coronary artery without angina pectoris: Secondary | ICD-10-CM | POA: Diagnosis not present

## 2021-09-21 DIAGNOSIS — Z7902 Long term (current) use of antithrombotics/antiplatelets: Secondary | ICD-10-CM | POA: Diagnosis not present

## 2021-09-21 DIAGNOSIS — F419 Anxiety disorder, unspecified: Secondary | ICD-10-CM | POA: Diagnosis not present

## 2021-09-21 DIAGNOSIS — M1990 Unspecified osteoarthritis, unspecified site: Secondary | ICD-10-CM | POA: Diagnosis not present

## 2021-09-21 DIAGNOSIS — M48062 Spinal stenosis, lumbar region with neurogenic claudication: Secondary | ICD-10-CM | POA: Diagnosis not present

## 2021-09-21 DIAGNOSIS — E876 Hypokalemia: Secondary | ICD-10-CM | POA: Diagnosis not present

## 2021-09-21 DIAGNOSIS — M810 Age-related osteoporosis without current pathological fracture: Secondary | ICD-10-CM | POA: Diagnosis not present

## 2021-09-21 DIAGNOSIS — E039 Hypothyroidism, unspecified: Secondary | ICD-10-CM | POA: Diagnosis not present

## 2021-09-21 DIAGNOSIS — I69334 Monoplegia of upper limb following cerebral infarction affecting left non-dominant side: Secondary | ICD-10-CM | POA: Diagnosis not present

## 2021-09-21 DIAGNOSIS — E871 Hypo-osmolality and hyponatremia: Secondary | ICD-10-CM | POA: Diagnosis not present

## 2021-09-21 DIAGNOSIS — D472 Monoclonal gammopathy: Secondary | ICD-10-CM | POA: Diagnosis not present

## 2021-09-21 DIAGNOSIS — Z9181 History of falling: Secondary | ICD-10-CM | POA: Diagnosis not present

## 2021-09-21 DIAGNOSIS — G629 Polyneuropathy, unspecified: Secondary | ICD-10-CM | POA: Diagnosis not present

## 2021-09-21 DIAGNOSIS — E785 Hyperlipidemia, unspecified: Secondary | ICD-10-CM | POA: Diagnosis not present

## 2021-09-21 DIAGNOSIS — K219 Gastro-esophageal reflux disease without esophagitis: Secondary | ICD-10-CM | POA: Diagnosis not present

## 2021-09-21 DIAGNOSIS — I2510 Atherosclerotic heart disease of native coronary artery without angina pectoris: Secondary | ICD-10-CM | POA: Diagnosis not present

## 2021-09-21 DIAGNOSIS — I10 Essential (primary) hypertension: Secondary | ICD-10-CM | POA: Diagnosis not present

## 2021-09-21 DIAGNOSIS — G2581 Restless legs syndrome: Secondary | ICD-10-CM | POA: Diagnosis not present

## 2021-09-21 DIAGNOSIS — Z87310 Personal history of (healed) osteoporosis fracture: Secondary | ICD-10-CM | POA: Diagnosis not present

## 2021-09-28 DIAGNOSIS — M48062 Spinal stenosis, lumbar region with neurogenic claudication: Secondary | ICD-10-CM | POA: Diagnosis not present

## 2021-09-28 DIAGNOSIS — I10 Essential (primary) hypertension: Secondary | ICD-10-CM | POA: Diagnosis not present

## 2021-09-28 DIAGNOSIS — E039 Hypothyroidism, unspecified: Secondary | ICD-10-CM | POA: Diagnosis not present

## 2021-09-28 DIAGNOSIS — G629 Polyneuropathy, unspecified: Secondary | ICD-10-CM | POA: Diagnosis not present

## 2021-09-28 DIAGNOSIS — I69334 Monoplegia of upper limb following cerebral infarction affecting left non-dominant side: Secondary | ICD-10-CM | POA: Diagnosis not present

## 2021-09-28 DIAGNOSIS — I2510 Atherosclerotic heart disease of native coronary artery without angina pectoris: Secondary | ICD-10-CM | POA: Diagnosis not present

## 2021-10-05 DIAGNOSIS — E039 Hypothyroidism, unspecified: Secondary | ICD-10-CM | POA: Diagnosis not present

## 2021-10-05 DIAGNOSIS — I10 Essential (primary) hypertension: Secondary | ICD-10-CM | POA: Diagnosis not present

## 2021-10-05 DIAGNOSIS — G629 Polyneuropathy, unspecified: Secondary | ICD-10-CM | POA: Diagnosis not present

## 2021-10-05 DIAGNOSIS — I69334 Monoplegia of upper limb following cerebral infarction affecting left non-dominant side: Secondary | ICD-10-CM | POA: Diagnosis not present

## 2021-10-05 DIAGNOSIS — I2510 Atherosclerotic heart disease of native coronary artery without angina pectoris: Secondary | ICD-10-CM | POA: Diagnosis not present

## 2021-10-05 DIAGNOSIS — M48062 Spinal stenosis, lumbar region with neurogenic claudication: Secondary | ICD-10-CM | POA: Diagnosis not present

## 2021-10-06 DIAGNOSIS — S42255A Nondisplaced fracture of greater tuberosity of left humerus, initial encounter for closed fracture: Secondary | ICD-10-CM | POA: Diagnosis not present

## 2021-10-06 DIAGNOSIS — M25552 Pain in left hip: Secondary | ICD-10-CM | POA: Diagnosis not present

## 2021-10-06 DIAGNOSIS — M25512 Pain in left shoulder: Secondary | ICD-10-CM | POA: Diagnosis not present

## 2021-10-06 DIAGNOSIS — S7002XA Contusion of left hip, initial encounter: Secondary | ICD-10-CM | POA: Diagnosis not present

## 2021-10-12 DIAGNOSIS — I10 Essential (primary) hypertension: Secondary | ICD-10-CM | POA: Diagnosis not present

## 2021-10-12 DIAGNOSIS — E039 Hypothyroidism, unspecified: Secondary | ICD-10-CM | POA: Diagnosis not present

## 2021-10-12 DIAGNOSIS — I2510 Atherosclerotic heart disease of native coronary artery without angina pectoris: Secondary | ICD-10-CM | POA: Diagnosis not present

## 2021-10-12 DIAGNOSIS — I69334 Monoplegia of upper limb following cerebral infarction affecting left non-dominant side: Secondary | ICD-10-CM | POA: Diagnosis not present

## 2021-10-12 DIAGNOSIS — M48062 Spinal stenosis, lumbar region with neurogenic claudication: Secondary | ICD-10-CM | POA: Diagnosis not present

## 2021-10-12 DIAGNOSIS — G629 Polyneuropathy, unspecified: Secondary | ICD-10-CM | POA: Diagnosis not present

## 2021-10-14 ENCOUNTER — Ambulatory Visit: Payer: Medicare Other | Admitting: Neurology

## 2021-10-20 DIAGNOSIS — I10 Essential (primary) hypertension: Secondary | ICD-10-CM | POA: Diagnosis not present

## 2021-10-20 DIAGNOSIS — G629 Polyneuropathy, unspecified: Secondary | ICD-10-CM | POA: Diagnosis not present

## 2021-10-20 DIAGNOSIS — E039 Hypothyroidism, unspecified: Secondary | ICD-10-CM | POA: Diagnosis not present

## 2021-10-20 DIAGNOSIS — I2510 Atherosclerotic heart disease of native coronary artery without angina pectoris: Secondary | ICD-10-CM | POA: Diagnosis not present

## 2021-10-20 DIAGNOSIS — I69334 Monoplegia of upper limb following cerebral infarction affecting left non-dominant side: Secondary | ICD-10-CM | POA: Diagnosis not present

## 2021-10-20 DIAGNOSIS — M48062 Spinal stenosis, lumbar region with neurogenic claudication: Secondary | ICD-10-CM | POA: Diagnosis not present

## 2021-10-21 DIAGNOSIS — M48062 Spinal stenosis, lumbar region with neurogenic claudication: Secondary | ICD-10-CM | POA: Diagnosis not present

## 2021-10-21 DIAGNOSIS — W19XXXD Unspecified fall, subsequent encounter: Secondary | ICD-10-CM | POA: Diagnosis not present

## 2021-10-21 DIAGNOSIS — M1990 Unspecified osteoarthritis, unspecified site: Secondary | ICD-10-CM | POA: Diagnosis not present

## 2021-10-21 DIAGNOSIS — M25512 Pain in left shoulder: Secondary | ICD-10-CM | POA: Diagnosis not present

## 2021-10-21 DIAGNOSIS — K219 Gastro-esophageal reflux disease without esophagitis: Secondary | ICD-10-CM | POA: Diagnosis not present

## 2021-10-21 DIAGNOSIS — D472 Monoclonal gammopathy: Secondary | ICD-10-CM | POA: Diagnosis not present

## 2021-10-21 DIAGNOSIS — G629 Polyneuropathy, unspecified: Secondary | ICD-10-CM | POA: Diagnosis not present

## 2021-10-21 DIAGNOSIS — F419 Anxiety disorder, unspecified: Secondary | ICD-10-CM | POA: Diagnosis not present

## 2021-10-21 DIAGNOSIS — Z9181 History of falling: Secondary | ICD-10-CM | POA: Diagnosis not present

## 2021-10-21 DIAGNOSIS — E876 Hypokalemia: Secondary | ICD-10-CM | POA: Diagnosis not present

## 2021-10-21 DIAGNOSIS — M80012D Age-related osteoporosis with current pathological fracture, left shoulder, subsequent encounter for fracture with routine healing: Secondary | ICD-10-CM | POA: Diagnosis not present

## 2021-10-21 DIAGNOSIS — E785 Hyperlipidemia, unspecified: Secondary | ICD-10-CM | POA: Diagnosis not present

## 2021-10-21 DIAGNOSIS — E039 Hypothyroidism, unspecified: Secondary | ICD-10-CM | POA: Diagnosis not present

## 2021-10-21 DIAGNOSIS — Z7902 Long term (current) use of antithrombotics/antiplatelets: Secondary | ICD-10-CM | POA: Diagnosis not present

## 2021-10-21 DIAGNOSIS — E871 Hypo-osmolality and hyponatremia: Secondary | ICD-10-CM | POA: Diagnosis not present

## 2021-10-21 DIAGNOSIS — I69334 Monoplegia of upper limb following cerebral infarction affecting left non-dominant side: Secondary | ICD-10-CM | POA: Diagnosis not present

## 2021-10-21 DIAGNOSIS — I2510 Atherosclerotic heart disease of native coronary artery without angina pectoris: Secondary | ICD-10-CM | POA: Diagnosis not present

## 2021-10-21 DIAGNOSIS — I10 Essential (primary) hypertension: Secondary | ICD-10-CM | POA: Diagnosis not present

## 2021-10-21 DIAGNOSIS — G2581 Restless legs syndrome: Secondary | ICD-10-CM | POA: Diagnosis not present

## 2021-10-28 ENCOUNTER — Ambulatory Visit (INDEPENDENT_AMBULATORY_CARE_PROVIDER_SITE_OTHER): Payer: Medicare Other

## 2021-10-28 DIAGNOSIS — M48062 Spinal stenosis, lumbar region with neurogenic claudication: Secondary | ICD-10-CM | POA: Diagnosis not present

## 2021-10-28 DIAGNOSIS — Z8673 Personal history of transient ischemic attack (TIA), and cerebral infarction without residual deficits: Secondary | ICD-10-CM

## 2021-10-28 DIAGNOSIS — G629 Polyneuropathy, unspecified: Secondary | ICD-10-CM | POA: Diagnosis not present

## 2021-10-28 DIAGNOSIS — I2510 Atherosclerotic heart disease of native coronary artery without angina pectoris: Secondary | ICD-10-CM | POA: Diagnosis not present

## 2021-10-28 DIAGNOSIS — I69334 Monoplegia of upper limb following cerebral infarction affecting left non-dominant side: Secondary | ICD-10-CM | POA: Diagnosis not present

## 2021-10-28 DIAGNOSIS — M80012D Age-related osteoporosis with current pathological fracture, left shoulder, subsequent encounter for fracture with routine healing: Secondary | ICD-10-CM | POA: Diagnosis not present

## 2021-10-28 DIAGNOSIS — I10 Essential (primary) hypertension: Secondary | ICD-10-CM | POA: Diagnosis not present

## 2021-10-28 LAB — CUP PACEART REMOTE DEVICE CHECK
Date Time Interrogation Session: 20230601092817
Implantable Pulse Generator Implant Date: 20230407

## 2021-10-30 DIAGNOSIS — I69334 Monoplegia of upper limb following cerebral infarction affecting left non-dominant side: Secondary | ICD-10-CM | POA: Diagnosis not present

## 2021-10-30 DIAGNOSIS — I2510 Atherosclerotic heart disease of native coronary artery without angina pectoris: Secondary | ICD-10-CM | POA: Diagnosis not present

## 2021-10-30 DIAGNOSIS — M80012D Age-related osteoporosis with current pathological fracture, left shoulder, subsequent encounter for fracture with routine healing: Secondary | ICD-10-CM | POA: Diagnosis not present

## 2021-10-30 DIAGNOSIS — M48062 Spinal stenosis, lumbar region with neurogenic claudication: Secondary | ICD-10-CM | POA: Diagnosis not present

## 2021-10-30 DIAGNOSIS — G629 Polyneuropathy, unspecified: Secondary | ICD-10-CM | POA: Diagnosis not present

## 2021-10-30 DIAGNOSIS — I10 Essential (primary) hypertension: Secondary | ICD-10-CM | POA: Diagnosis not present

## 2021-11-01 DIAGNOSIS — I2510 Atherosclerotic heart disease of native coronary artery without angina pectoris: Secondary | ICD-10-CM | POA: Diagnosis not present

## 2021-11-01 DIAGNOSIS — M80012D Age-related osteoporosis with current pathological fracture, left shoulder, subsequent encounter for fracture with routine healing: Secondary | ICD-10-CM | POA: Diagnosis not present

## 2021-11-01 DIAGNOSIS — G629 Polyneuropathy, unspecified: Secondary | ICD-10-CM | POA: Diagnosis not present

## 2021-11-01 DIAGNOSIS — I10 Essential (primary) hypertension: Secondary | ICD-10-CM | POA: Diagnosis not present

## 2021-11-01 DIAGNOSIS — I69334 Monoplegia of upper limb following cerebral infarction affecting left non-dominant side: Secondary | ICD-10-CM | POA: Diagnosis not present

## 2021-11-01 DIAGNOSIS — M48062 Spinal stenosis, lumbar region with neurogenic claudication: Secondary | ICD-10-CM | POA: Diagnosis not present

## 2021-11-03 DIAGNOSIS — I69334 Monoplegia of upper limb following cerebral infarction affecting left non-dominant side: Secondary | ICD-10-CM | POA: Diagnosis not present

## 2021-11-03 DIAGNOSIS — I10 Essential (primary) hypertension: Secondary | ICD-10-CM | POA: Diagnosis not present

## 2021-11-03 DIAGNOSIS — M48062 Spinal stenosis, lumbar region with neurogenic claudication: Secondary | ICD-10-CM | POA: Diagnosis not present

## 2021-11-03 DIAGNOSIS — G629 Polyneuropathy, unspecified: Secondary | ICD-10-CM | POA: Diagnosis not present

## 2021-11-03 DIAGNOSIS — M80012D Age-related osteoporosis with current pathological fracture, left shoulder, subsequent encounter for fracture with routine healing: Secondary | ICD-10-CM | POA: Diagnosis not present

## 2021-11-03 DIAGNOSIS — I2510 Atherosclerotic heart disease of native coronary artery without angina pectoris: Secondary | ICD-10-CM | POA: Diagnosis not present

## 2021-11-04 DIAGNOSIS — S42202D Unspecified fracture of upper end of left humerus, subsequent encounter for fracture with routine healing: Secondary | ICD-10-CM | POA: Diagnosis not present

## 2021-11-05 DIAGNOSIS — M80012D Age-related osteoporosis with current pathological fracture, left shoulder, subsequent encounter for fracture with routine healing: Secondary | ICD-10-CM | POA: Diagnosis not present

## 2021-11-05 DIAGNOSIS — I69334 Monoplegia of upper limb following cerebral infarction affecting left non-dominant side: Secondary | ICD-10-CM | POA: Diagnosis not present

## 2021-11-05 DIAGNOSIS — M48062 Spinal stenosis, lumbar region with neurogenic claudication: Secondary | ICD-10-CM | POA: Diagnosis not present

## 2021-11-05 DIAGNOSIS — G629 Polyneuropathy, unspecified: Secondary | ICD-10-CM | POA: Diagnosis not present

## 2021-11-05 DIAGNOSIS — I10 Essential (primary) hypertension: Secondary | ICD-10-CM | POA: Diagnosis not present

## 2021-11-05 DIAGNOSIS — I2510 Atherosclerotic heart disease of native coronary artery without angina pectoris: Secondary | ICD-10-CM | POA: Diagnosis not present

## 2021-11-05 NOTE — Progress Notes (Signed)
[facility] Summary Report / Loop Recorder 

## 2021-11-08 DIAGNOSIS — I2510 Atherosclerotic heart disease of native coronary artery without angina pectoris: Secondary | ICD-10-CM | POA: Diagnosis not present

## 2021-11-08 DIAGNOSIS — M48062 Spinal stenosis, lumbar region with neurogenic claudication: Secondary | ICD-10-CM | POA: Diagnosis not present

## 2021-11-08 DIAGNOSIS — G629 Polyneuropathy, unspecified: Secondary | ICD-10-CM | POA: Diagnosis not present

## 2021-11-08 DIAGNOSIS — I10 Essential (primary) hypertension: Secondary | ICD-10-CM | POA: Diagnosis not present

## 2021-11-08 DIAGNOSIS — M80012D Age-related osteoporosis with current pathological fracture, left shoulder, subsequent encounter for fracture with routine healing: Secondary | ICD-10-CM | POA: Diagnosis not present

## 2021-11-08 DIAGNOSIS — I69334 Monoplegia of upper limb following cerebral infarction affecting left non-dominant side: Secondary | ICD-10-CM | POA: Diagnosis not present

## 2021-11-10 DIAGNOSIS — I2510 Atherosclerotic heart disease of native coronary artery without angina pectoris: Secondary | ICD-10-CM | POA: Diagnosis not present

## 2021-11-10 DIAGNOSIS — G629 Polyneuropathy, unspecified: Secondary | ICD-10-CM | POA: Diagnosis not present

## 2021-11-10 DIAGNOSIS — I10 Essential (primary) hypertension: Secondary | ICD-10-CM | POA: Diagnosis not present

## 2021-11-10 DIAGNOSIS — M80012D Age-related osteoporosis with current pathological fracture, left shoulder, subsequent encounter for fracture with routine healing: Secondary | ICD-10-CM | POA: Diagnosis not present

## 2021-11-10 DIAGNOSIS — I69334 Monoplegia of upper limb following cerebral infarction affecting left non-dominant side: Secondary | ICD-10-CM | POA: Diagnosis not present

## 2021-11-10 DIAGNOSIS — M48062 Spinal stenosis, lumbar region with neurogenic claudication: Secondary | ICD-10-CM | POA: Diagnosis not present

## 2021-11-11 ENCOUNTER — Inpatient Hospital Stay (HOSPITAL_BASED_OUTPATIENT_CLINIC_OR_DEPARTMENT_OTHER): Payer: Medicare Other | Admitting: Hematology & Oncology

## 2021-11-11 ENCOUNTER — Encounter: Payer: Self-pay | Admitting: Hematology & Oncology

## 2021-11-11 ENCOUNTER — Inpatient Hospital Stay: Payer: Medicare Other | Attending: Hematology & Oncology

## 2021-11-11 ENCOUNTER — Other Ambulatory Visit: Payer: Self-pay | Admitting: Oncology

## 2021-11-11 VITALS — BP 146/99 | HR 81 | Temp 97.8°F | Resp 20 | Ht 61.0 in | Wt 132.1 lb

## 2021-11-11 DIAGNOSIS — Z8673 Personal history of transient ischemic attack (TIA), and cerebral infarction without residual deficits: Secondary | ICD-10-CM | POA: Insufficient documentation

## 2021-11-11 DIAGNOSIS — D472 Monoclonal gammopathy: Secondary | ICD-10-CM

## 2021-11-11 DIAGNOSIS — Z79899 Other long term (current) drug therapy: Secondary | ICD-10-CM | POA: Insufficient documentation

## 2021-11-11 DIAGNOSIS — S42202A Unspecified fracture of upper end of left humerus, initial encounter for closed fracture: Secondary | ICD-10-CM | POA: Diagnosis not present

## 2021-11-11 DIAGNOSIS — M791 Myalgia, unspecified site: Secondary | ICD-10-CM | POA: Diagnosis not present

## 2021-11-11 DIAGNOSIS — R410 Disorientation, unspecified: Secondary | ICD-10-CM | POA: Diagnosis not present

## 2021-11-11 DIAGNOSIS — Z79890 Hormone replacement therapy (postmenopausal): Secondary | ICD-10-CM | POA: Diagnosis not present

## 2021-11-11 LAB — CMP (CANCER CENTER ONLY)
ALT: 15 U/L (ref 0–44)
AST: 23 U/L (ref 15–41)
Albumin: 4.6 g/dL (ref 3.5–5.0)
Alkaline Phosphatase: 86 U/L (ref 38–126)
Anion gap: 7 (ref 5–15)
BUN: 16 mg/dL (ref 8–23)
CO2: 32 mmol/L (ref 22–32)
Calcium: 10.3 mg/dL (ref 8.9–10.3)
Chloride: 92 mmol/L — ABNORMAL LOW (ref 98–111)
Creatinine: 0.81 mg/dL (ref 0.44–1.00)
GFR, Estimated: >60 mL/min (ref >60)
Glucose, Bld: 85 mg/dL (ref 70–99)
Potassium: 3.7 mmol/L (ref 3.5–5.1)
Sodium: 131 mmol/L — ABNORMAL LOW (ref 135–145)
Total Bilirubin: 0.5 mg/dL (ref 0.3–1.2)
Total Protein: 7.3 g/dL (ref 6.5–8.1)

## 2021-11-11 LAB — CBC WITH DIFFERENTIAL (CANCER CENTER ONLY)
Abs Immature Granulocytes: 0.01 10*3/uL (ref 0.00–0.07)
Basophils Absolute: 0 10*3/uL (ref 0.0–0.1)
Basophils Relative: 0 %
Eosinophils Absolute: 0.1 10*3/uL (ref 0.0–0.5)
Eosinophils Relative: 3 %
HCT: 40.3 % (ref 36.0–46.0)
Hemoglobin: 13.3 g/dL (ref 12.0–15.0)
Immature Granulocytes: 0 %
Lymphocytes Relative: 36 %
Lymphs Abs: 1.4 10*3/uL (ref 0.7–4.0)
MCH: 31.4 pg (ref 26.0–34.0)
MCHC: 33 g/dL (ref 30.0–36.0)
MCV: 95 fL (ref 80.0–100.0)
Monocytes Absolute: 0.5 10*3/uL (ref 0.1–1.0)
Monocytes Relative: 13 %
Neutro Abs: 1.8 10*3/uL (ref 1.7–7.7)
Neutrophils Relative %: 48 %
Platelet Count: 289 10*3/uL (ref 150–400)
RBC: 4.24 MIL/uL (ref 3.87–5.11)
RDW: 12.2 % (ref 11.5–15.5)
WBC Count: 3.9 10*3/uL — ABNORMAL LOW (ref 4.0–10.5)
nRBC: 0 % (ref 0.0–0.2)

## 2021-11-11 LAB — LACTATE DEHYDROGENASE: LDH: 174 U/L (ref 98–192)

## 2021-11-11 NOTE — Progress Notes (Signed)
Hematology and Oncology Follow Up Visit  [name] [id number] [birth year] 88 y.o. 11/11/2021   Principle Diagnosis:  IgA Kappa MGUS  Current Therapy:   Observation     Interim History:  [name] is back for follow-up.  Unfortunately, she had another incident I have since we last saw her.  She fell about 5 weeks ago.  She broke her left shoulder.  She did not require any surgery.  Thankfully, it seems like this seems to be healing up.  She is on Plavix.  She is on Plavix because of the past CVA that she had.  When we last saw her, her monoclonal studies showed an M spike of 0.3 g/dL.  Her IgA level was 402 mg/dL.  The Kappa light chain was 3.6 mg/dL.  Her appetite is okay.  She has had no nausea or vomiting.  She has had no cough or shortness of breath.  She did have a hematoma in the left hip area.  This seems to be improved.  Overall, I would say performance status is probably ECOG 2.    Medications:  Current Outpatient Medications:    ALPRAZolam (XANAX) 0.5 MG tablet, Take 0.25 mg by mouth at bedtime as needed (restless legs)., Disp: , Rfl:    amLODipine (NORVASC) 5 MG tablet, Take 5 mg by mouth daily., Disp: , Rfl:    Ascorbic Acid (VITAMIN C PO), Take 1 tablet by mouth daily., Disp: , Rfl:    Calcium Carbonate-Vitamin D (CALCIUM-D PO), Take 1 tablet by mouth daily., Disp: , Rfl:    clopidogrel (PLAVIX) 75 MG tablet, Take 1 tablet (75 mg total) by mouth daily., Disp: 30 tablet, Rfl: 3   dexlansoprazole (DEXILANT) 60 MG capsule, Take 60 mg by mouth every morning., Disp: , Rfl:    escitalopram (LEXAPRO) 10 MG tablet, Take 10 mg by mouth at bedtime., Disp: , Rfl:    levothyroxine (SYNTHROID, LEVOTHROID) 25 MCG tablet, Take 25 mcg by mouth daily before breakfast. , Disp: , Rfl:    metoprolol succinate (TOPROL-XL) 25 MG 24 hr tablet, Take 25 mg by mouth every morning., Disp: , Rfl:    Multiple Vitamin (MULTIVITAMIN WITH MINERALS) TABS tablet, Take 1 tablet by mouth daily., Disp:  , Rfl:    Polyvinyl Alcohol-Povidone (REFRESH OP), Place 1 drop into both eyes daily as needed (dry eyes)., Disp: , Rfl:    rosuvastatin (CRESTOR) 20 MG tablet, Take 1 tablet (20 mg total) by mouth daily., Disp: 30 tablet, Rfl: 0   triamterene-hydrochlorothiazide (MAXZIDE-25) 37.5-25 MG tablet, Take 1 tablet by mouth every morning. Hold this medication for now, please see your pcp for hospital discharge follow up, repeat sodium level, please discussion resumption or discontinuation this meds (Patient taking differently: Take 1 tablet by mouth every morning.), Disp: , Rfl:    Vitamin D, Ergocalciferol, (DRISDOL) 50000 UNITS CAPS, Take 50,000 Units by mouth every Friday., Disp: , Rfl:    VITAMIN E PO, Take 1 capsule by mouth daily., Disp: , Rfl:    Wheat Dextrin (BENEFIBER) POWD, Take 1 daily dose, Disp: 500 g, Rfl: 0   zolpidem (AMBIEN CR) 12.5 MG CR tablet, Take 12.5 mg by mouth at bedtime., Disp: , Rfl:    amoxicillin (AMOXIL) 500 MG capsule, Take 2,000 mg by mouth as needed (dental). Take 4 capsules (2000 mg) by mouth one hour prior to dental appointments (Patient not taking: Reported on 11/11/2021), Disp: , Rfl:    HYDROcodone-acetaminophen (NORCO/VICODIN) 5-325 MG tablet, Take 1 tablet by  mouth every 4 (four) hours as needed for moderate pain. (Patient not taking: Reported on 11/11/2021), Disp: 20 tablet, Rfl: 0   NONFORMULARY OR COMPOUNDED ITEM, Apply 1 application topically See admin instructions. Transdermal therapeutics  meloxicam 0.5%, doxepin 3%, amantadine 3%, dextromethorphan 2%, lidocaine 2%. - compounded at [place]:  Apply topically to legs daily after showering for neuropathy (Patient not taking: Reported on 11/11/2021), Disp: , Rfl:   Allergies: No Known Allergies  Past Medical History, Surgical history, Social history, and Family History were reviewed and updated.  Review of Systems: Review of Systems  Constitutional: Negative.  Negative for appetite change, fatigue,  fever and unexpected weight change.  HENT:  Negative.  Negative for lump/mass, mouth sores, sore throat and trouble swallowing.   Eyes: Negative.   Respiratory: Negative.  Negative for cough, hemoptysis and shortness of breath.   Cardiovascular: Negative.  Negative for leg swelling and palpitations.  Gastrointestinal: Negative.  Negative for abdominal distention, abdominal pain, blood in stool, constipation, diarrhea, nausea and vomiting.  Endocrine: Negative.   Genitourinary: Negative.  Negative for bladder incontinence, dysuria, frequency and hematuria.   Musculoskeletal:  Positive for myalgias. Negative for arthralgias, back pain and gait problem.  Skin: Negative.  Negative for itching and rash.  Neurological:  Positive for headaches. Negative for dizziness, extremity weakness, gait problem, numbness, seizures and speech difficulty.  Hematological: Negative.  Does not bruise/bleed easily.  Psychiatric/Behavioral: Negative.  Negative for depression and sleep disturbance. The patient is not nervous/anxious.     Physical Exam:  height is '5\' 1"'$  (1.549 m) and weight is 132 lb 1.9 oz (59.9 kg). Her oral temperature is 97.8 F (36.6 C). Her blood pressure is 146/99 (abnormal) and her pulse is 81. Her respiration is 20 and oxygen saturation is 100%.   Wt Readings from Last 3 Encounters:  11/11/21 132 lb 1.9 oz (59.9 kg)  09/03/21 134 lb (60.8 kg)  08/18/21 134 lb (60.8 kg)    Physical Exam Vitals reviewed.  HENT:     Head: Normocephalic and atraumatic.  Eyes:     Pupils: Pupils are equal, round, and reactive to light.  Cardiovascular:     Rate and Rhythm: Normal rate and regular rhythm.     Heart sounds: Normal heart sounds.  Pulmonary:     Effort: Pulmonary effort is normal.     Breath sounds: Normal breath sounds.  Abdominal:     General: Bowel sounds are normal.     Palpations: Abdomen is soft.  Musculoskeletal:        General: No tenderness or deformity. Normal range of  motion.     Cervical back: Normal range of motion.  Lymphadenopathy:     Cervical: No cervical adenopathy.  Skin:    General: Skin is warm and dry.     Findings: No erythema or rash.  Neurological:     Mental Status: She is alert and oriented to person, place, and time.  Psychiatric:        Behavior: Behavior normal.        Thought Content: Thought content normal.        Judgment: Judgment normal.      Lab Results  Component Value Date   WBC 3.9 (L) 11/11/2021   HGB 13.3 11/11/2021   HCT 40.3 11/11/2021   MCV 95.0 11/11/2021   PLT 289 11/11/2021     Chemistry      Component Value Date/Time   NA 131 (L) 11/11/2021 1011   NA  133 (L) 07/06/2021 1528   K 3.7 11/11/2021 1011   CL 92 (L) 11/11/2021 1011   CO2 32 11/11/2021 1011   BUN 16 11/11/2021 1011   BUN 14 07/06/2021 1528   CREATININE 0.81 11/11/2021 1011      Component Value Date/Time   CALCIUM 10.3 11/11/2021 1011   ALKPHOS 86 11/11/2021 1011   AST 23 11/11/2021 1011   ALT 15 11/11/2021 1011   BILITOT 0.5 11/11/2021 1011      Impression and Plan: Ms. Caravello is a 88 year old white female.  She has an IgA kappa MGUS.  This is still at a very low level.  This certainly could be from her age alone.  I hate the fact that she falls.  Thankfully, this shoulder fracture and seems to be doing better.  She does have better range of motion of her arm.  I forgot to mention that she does have a loop in to help monitor any count of arrhythmia.  We will continue to follow her along.  We will plan to get her back in about 3 to 4 months. [name], MD 6/15/202311:35 AM

## 2021-11-12 LAB — KAPPA/LAMBDA LIGHT CHAINS
Kappa free light chain: 34.1 mg/L — ABNORMAL HIGH (ref 3.3–19.4)
Kappa, lambda light chain ratio: 2.1 — ABNORMAL HIGH (ref 0.26–1.65)
Lambda free light chains: 16.2 mg/L (ref 5.7–26.3)

## 2021-11-13 LAB — IGG, IGA, IGM
IgA: 391 mg/dL (ref 64–422)
IgG (Immunoglobin G), Serum: 835 mg/dL (ref 586–1602)
IgM (Immunoglobulin M), Srm: 73 mg/dL (ref 26–217)

## 2021-11-16 DIAGNOSIS — I69334 Monoplegia of upper limb following cerebral infarction affecting left non-dominant side: Secondary | ICD-10-CM | POA: Diagnosis not present

## 2021-11-16 DIAGNOSIS — I10 Essential (primary) hypertension: Secondary | ICD-10-CM | POA: Diagnosis not present

## 2021-11-16 DIAGNOSIS — I2510 Atherosclerotic heart disease of native coronary artery without angina pectoris: Secondary | ICD-10-CM | POA: Diagnosis not present

## 2021-11-16 DIAGNOSIS — G629 Polyneuropathy, unspecified: Secondary | ICD-10-CM | POA: Diagnosis not present

## 2021-11-16 DIAGNOSIS — M48062 Spinal stenosis, lumbar region with neurogenic claudication: Secondary | ICD-10-CM | POA: Diagnosis not present

## 2021-11-16 DIAGNOSIS — M80012D Age-related osteoporosis with current pathological fracture, left shoulder, subsequent encounter for fracture with routine healing: Secondary | ICD-10-CM | POA: Diagnosis not present

## 2021-11-16 LAB — PROTEIN ELECTROPHORESIS, SERUM, WITH REFLEX
A/G Ratio: 1.4 (ref 0.7–1.7)
Albumin ELP: 4 g/dL (ref 2.9–4.4)
Alpha-1-Globulin: 0.2 g/dL (ref 0.0–0.4)
Alpha-2-Globulin: 0.7 g/dL (ref 0.4–1.0)
Beta Globulin: 1 g/dL (ref 0.7–1.3)
Gamma Globulin: 0.9 g/dL (ref 0.4–1.8)
Globulin, Total: 2.9 g/dL (ref 2.2–3.9)
M-Spike, %: 0.3 g/dL — ABNORMAL HIGH
SPEP Interpretation: 0
Total Protein ELP: 6.9 g/dL (ref 6.0–8.5)

## 2021-11-16 LAB — IMMUNOFIXATION REFLEX, SERUM
IgA: 404 mg/dL (ref 64–422)
IgG (Immunoglobin G), Serum: 837 mg/dL (ref 586–1602)
IgM (Immunoglobulin M), Srm: 79 mg/dL (ref 26–217)

## 2021-11-17 ENCOUNTER — Encounter: Payer: Self-pay | Admitting: *Deleted

## 2021-11-17 DIAGNOSIS — G629 Polyneuropathy, unspecified: Secondary | ICD-10-CM | POA: Diagnosis not present

## 2021-11-17 DIAGNOSIS — M80012D Age-related osteoporosis with current pathological fracture, left shoulder, subsequent encounter for fracture with routine healing: Secondary | ICD-10-CM | POA: Diagnosis not present

## 2021-11-17 DIAGNOSIS — I69334 Monoplegia of upper limb following cerebral infarction affecting left non-dominant side: Secondary | ICD-10-CM | POA: Diagnosis not present

## 2021-11-17 DIAGNOSIS — I10 Essential (primary) hypertension: Secondary | ICD-10-CM | POA: Diagnosis not present

## 2021-11-17 DIAGNOSIS — M48062 Spinal stenosis, lumbar region with neurogenic claudication: Secondary | ICD-10-CM | POA: Diagnosis not present

## 2021-11-17 DIAGNOSIS — I2510 Atherosclerotic heart disease of native coronary artery without angina pectoris: Secondary | ICD-10-CM | POA: Diagnosis not present

## 2021-11-20 DIAGNOSIS — G629 Polyneuropathy, unspecified: Secondary | ICD-10-CM | POA: Diagnosis not present

## 2021-11-20 DIAGNOSIS — M48062 Spinal stenosis, lumbar region with neurogenic claudication: Secondary | ICD-10-CM | POA: Diagnosis not present

## 2021-11-20 DIAGNOSIS — K219 Gastro-esophageal reflux disease without esophagitis: Secondary | ICD-10-CM | POA: Diagnosis not present

## 2021-11-20 DIAGNOSIS — M80012D Age-related osteoporosis with current pathological fracture, left shoulder, subsequent encounter for fracture with routine healing: Secondary | ICD-10-CM | POA: Diagnosis not present

## 2021-11-20 DIAGNOSIS — I10 Essential (primary) hypertension: Secondary | ICD-10-CM | POA: Diagnosis not present

## 2021-11-20 DIAGNOSIS — I2510 Atherosclerotic heart disease of native coronary artery without angina pectoris: Secondary | ICD-10-CM | POA: Diagnosis not present

## 2021-11-20 DIAGNOSIS — E876 Hypokalemia: Secondary | ICD-10-CM | POA: Diagnosis not present

## 2021-11-20 DIAGNOSIS — G2581 Restless legs syndrome: Secondary | ICD-10-CM | POA: Diagnosis not present

## 2021-11-20 DIAGNOSIS — D472 Monoclonal gammopathy: Secondary | ICD-10-CM | POA: Diagnosis not present

## 2021-11-20 DIAGNOSIS — Z9181 History of falling: Secondary | ICD-10-CM | POA: Diagnosis not present

## 2021-11-20 DIAGNOSIS — E039 Hypothyroidism, unspecified: Secondary | ICD-10-CM | POA: Diagnosis not present

## 2021-11-20 DIAGNOSIS — F419 Anxiety disorder, unspecified: Secondary | ICD-10-CM | POA: Diagnosis not present

## 2021-11-20 DIAGNOSIS — Z7902 Long term (current) use of antithrombotics/antiplatelets: Secondary | ICD-10-CM | POA: Diagnosis not present

## 2021-11-20 DIAGNOSIS — M1990 Unspecified osteoarthritis, unspecified site: Secondary | ICD-10-CM | POA: Diagnosis not present

## 2021-11-20 DIAGNOSIS — E871 Hypo-osmolality and hyponatremia: Secondary | ICD-10-CM | POA: Diagnosis not present

## 2021-11-20 DIAGNOSIS — I69334 Monoplegia of upper limb following cerebral infarction affecting left non-dominant side: Secondary | ICD-10-CM | POA: Diagnosis not present

## 2021-11-20 DIAGNOSIS — W19XXXD Unspecified fall, subsequent encounter: Secondary | ICD-10-CM | POA: Diagnosis not present

## 2021-11-20 DIAGNOSIS — E785 Hyperlipidemia, unspecified: Secondary | ICD-10-CM | POA: Diagnosis not present

## 2021-11-23 DIAGNOSIS — M48062 Spinal stenosis, lumbar region with neurogenic claudication: Secondary | ICD-10-CM | POA: Diagnosis not present

## 2021-11-23 DIAGNOSIS — I2510 Atherosclerotic heart disease of native coronary artery without angina pectoris: Secondary | ICD-10-CM | POA: Diagnosis not present

## 2021-11-23 DIAGNOSIS — I10 Essential (primary) hypertension: Secondary | ICD-10-CM | POA: Diagnosis not present

## 2021-11-23 DIAGNOSIS — G629 Polyneuropathy, unspecified: Secondary | ICD-10-CM | POA: Diagnosis not present

## 2021-11-23 DIAGNOSIS — I69334 Monoplegia of upper limb following cerebral infarction affecting left non-dominant side: Secondary | ICD-10-CM | POA: Diagnosis not present

## 2021-11-23 DIAGNOSIS — M80012D Age-related osteoporosis with current pathological fracture, left shoulder, subsequent encounter for fracture with routine healing: Secondary | ICD-10-CM | POA: Diagnosis not present

## 2021-11-24 DIAGNOSIS — Z961 Presence of intraocular lens: Secondary | ICD-10-CM | POA: Diagnosis not present

## 2021-11-24 DIAGNOSIS — H26491 Other secondary cataract, right eye: Secondary | ICD-10-CM | POA: Diagnosis not present

## 2021-11-27 DIAGNOSIS — I69334 Monoplegia of upper limb following cerebral infarction affecting left non-dominant side: Secondary | ICD-10-CM | POA: Diagnosis not present

## 2021-11-27 DIAGNOSIS — M80012D Age-related osteoporosis with current pathological fracture, left shoulder, subsequent encounter for fracture with routine healing: Secondary | ICD-10-CM | POA: Diagnosis not present

## 2021-11-27 DIAGNOSIS — I10 Essential (primary) hypertension: Secondary | ICD-10-CM | POA: Diagnosis not present

## 2021-11-27 DIAGNOSIS — G629 Polyneuropathy, unspecified: Secondary | ICD-10-CM | POA: Diagnosis not present

## 2021-11-27 DIAGNOSIS — M48062 Spinal stenosis, lumbar region with neurogenic claudication: Secondary | ICD-10-CM | POA: Diagnosis not present

## 2021-11-27 DIAGNOSIS — I2510 Atherosclerotic heart disease of native coronary artery without angina pectoris: Secondary | ICD-10-CM | POA: Diagnosis not present

## 2021-11-29 DIAGNOSIS — M80012D Age-related osteoporosis with current pathological fracture, left shoulder, subsequent encounter for fracture with routine healing: Secondary | ICD-10-CM | POA: Diagnosis not present

## 2021-11-29 DIAGNOSIS — I10 Essential (primary) hypertension: Secondary | ICD-10-CM | POA: Diagnosis not present

## 2021-11-29 DIAGNOSIS — M48062 Spinal stenosis, lumbar region with neurogenic claudication: Secondary | ICD-10-CM | POA: Diagnosis not present

## 2021-11-29 DIAGNOSIS — G629 Polyneuropathy, unspecified: Secondary | ICD-10-CM | POA: Diagnosis not present

## 2021-11-29 DIAGNOSIS — I69334 Monoplegia of upper limb following cerebral infarction affecting left non-dominant side: Secondary | ICD-10-CM | POA: Diagnosis not present

## 2021-11-29 DIAGNOSIS — I2510 Atherosclerotic heart disease of native coronary artery without angina pectoris: Secondary | ICD-10-CM | POA: Diagnosis not present

## 2021-12-01 ENCOUNTER — Ambulatory Visit (INDEPENDENT_AMBULATORY_CARE_PROVIDER_SITE_OTHER): Payer: Medicare Other

## 2021-12-01 DIAGNOSIS — I639 Cerebral infarction, unspecified: Secondary | ICD-10-CM | POA: Diagnosis not present

## 2021-12-02 DIAGNOSIS — S42202D Unspecified fracture of upper end of left humerus, subsequent encounter for fracture with routine healing: Secondary | ICD-10-CM | POA: Diagnosis not present

## 2021-12-02 LAB — CUP PACEART REMOTE DEVICE CHECK
Date Time Interrogation Session: 20230704092515
Implantable Pulse Generator Implant Date: 20230407

## 2021-12-03 DIAGNOSIS — M48062 Spinal stenosis, lumbar region with neurogenic claudication: Secondary | ICD-10-CM | POA: Diagnosis not present

## 2021-12-03 DIAGNOSIS — G629 Polyneuropathy, unspecified: Secondary | ICD-10-CM | POA: Diagnosis not present

## 2021-12-03 DIAGNOSIS — I2510 Atherosclerotic heart disease of native coronary artery without angina pectoris: Secondary | ICD-10-CM | POA: Diagnosis not present

## 2021-12-03 DIAGNOSIS — I10 Essential (primary) hypertension: Secondary | ICD-10-CM | POA: Diagnosis not present

## 2021-12-03 DIAGNOSIS — I69334 Monoplegia of upper limb following cerebral infarction affecting left non-dominant side: Secondary | ICD-10-CM | POA: Diagnosis not present

## 2021-12-03 DIAGNOSIS — M80012D Age-related osteoporosis with current pathological fracture, left shoulder, subsequent encounter for fracture with routine healing: Secondary | ICD-10-CM | POA: Diagnosis not present

## 2021-12-07 DIAGNOSIS — L570 Actinic keratosis: Secondary | ICD-10-CM | POA: Diagnosis not present

## 2021-12-07 DIAGNOSIS — G629 Polyneuropathy, unspecified: Secondary | ICD-10-CM | POA: Diagnosis not present

## 2021-12-07 DIAGNOSIS — I2510 Atherosclerotic heart disease of native coronary artery without angina pectoris: Secondary | ICD-10-CM | POA: Diagnosis not present

## 2021-12-07 DIAGNOSIS — M48062 Spinal stenosis, lumbar region with neurogenic claudication: Secondary | ICD-10-CM | POA: Diagnosis not present

## 2021-12-07 DIAGNOSIS — I69334 Monoplegia of upper limb following cerebral infarction affecting left non-dominant side: Secondary | ICD-10-CM | POA: Diagnosis not present

## 2021-12-07 DIAGNOSIS — M80012D Age-related osteoporosis with current pathological fracture, left shoulder, subsequent encounter for fracture with routine healing: Secondary | ICD-10-CM | POA: Diagnosis not present

## 2021-12-07 DIAGNOSIS — Z85828 Personal history of other malignant neoplasm of skin: Secondary | ICD-10-CM | POA: Diagnosis not present

## 2021-12-07 DIAGNOSIS — D692 Other nonthrombocytopenic purpura: Secondary | ICD-10-CM | POA: Diagnosis not present

## 2021-12-07 DIAGNOSIS — L821 Other seborrheic keratosis: Secondary | ICD-10-CM | POA: Diagnosis not present

## 2021-12-07 DIAGNOSIS — I10 Essential (primary) hypertension: Secondary | ICD-10-CM | POA: Diagnosis not present

## 2021-12-09 DIAGNOSIS — I10 Essential (primary) hypertension: Secondary | ICD-10-CM | POA: Diagnosis not present

## 2021-12-09 DIAGNOSIS — I2510 Atherosclerotic heart disease of native coronary artery without angina pectoris: Secondary | ICD-10-CM | POA: Diagnosis not present

## 2021-12-09 DIAGNOSIS — I69334 Monoplegia of upper limb following cerebral infarction affecting left non-dominant side: Secondary | ICD-10-CM | POA: Diagnosis not present

## 2021-12-09 DIAGNOSIS — M80012D Age-related osteoporosis with current pathological fracture, left shoulder, subsequent encounter for fracture with routine healing: Secondary | ICD-10-CM | POA: Diagnosis not present

## 2021-12-09 DIAGNOSIS — M48062 Spinal stenosis, lumbar region with neurogenic claudication: Secondary | ICD-10-CM | POA: Diagnosis not present

## 2021-12-09 DIAGNOSIS — G629 Polyneuropathy, unspecified: Secondary | ICD-10-CM | POA: Diagnosis not present

## 2021-12-10 DIAGNOSIS — M48062 Spinal stenosis, lumbar region with neurogenic claudication: Secondary | ICD-10-CM | POA: Diagnosis not present

## 2021-12-10 DIAGNOSIS — G629 Polyneuropathy, unspecified: Secondary | ICD-10-CM | POA: Diagnosis not present

## 2021-12-10 DIAGNOSIS — I10 Essential (primary) hypertension: Secondary | ICD-10-CM | POA: Diagnosis not present

## 2021-12-10 DIAGNOSIS — I69334 Monoplegia of upper limb following cerebral infarction affecting left non-dominant side: Secondary | ICD-10-CM | POA: Diagnosis not present

## 2021-12-10 DIAGNOSIS — I2510 Atherosclerotic heart disease of native coronary artery without angina pectoris: Secondary | ICD-10-CM | POA: Diagnosis not present

## 2021-12-10 DIAGNOSIS — M80012D Age-related osteoporosis with current pathological fracture, left shoulder, subsequent encounter for fracture with routine healing: Secondary | ICD-10-CM | POA: Diagnosis not present

## 2021-12-13 DIAGNOSIS — M80012D Age-related osteoporosis with current pathological fracture, left shoulder, subsequent encounter for fracture with routine healing: Secondary | ICD-10-CM | POA: Diagnosis not present

## 2021-12-13 DIAGNOSIS — I2510 Atherosclerotic heart disease of native coronary artery without angina pectoris: Secondary | ICD-10-CM | POA: Diagnosis not present

## 2021-12-13 DIAGNOSIS — I10 Essential (primary) hypertension: Secondary | ICD-10-CM | POA: Diagnosis not present

## 2021-12-13 DIAGNOSIS — M48062 Spinal stenosis, lumbar region with neurogenic claudication: Secondary | ICD-10-CM | POA: Diagnosis not present

## 2021-12-13 DIAGNOSIS — G629 Polyneuropathy, unspecified: Secondary | ICD-10-CM | POA: Diagnosis not present

## 2021-12-13 DIAGNOSIS — I69334 Monoplegia of upper limb following cerebral infarction affecting left non-dominant side: Secondary | ICD-10-CM | POA: Diagnosis not present

## 2021-12-16 DIAGNOSIS — I2510 Atherosclerotic heart disease of native coronary artery without angina pectoris: Secondary | ICD-10-CM | POA: Diagnosis not present

## 2021-12-16 DIAGNOSIS — M80012D Age-related osteoporosis with current pathological fracture, left shoulder, subsequent encounter for fracture with routine healing: Secondary | ICD-10-CM | POA: Diagnosis not present

## 2021-12-16 DIAGNOSIS — I69334 Monoplegia of upper limb following cerebral infarction affecting left non-dominant side: Secondary | ICD-10-CM | POA: Diagnosis not present

## 2021-12-16 DIAGNOSIS — M48062 Spinal stenosis, lumbar region with neurogenic claudication: Secondary | ICD-10-CM | POA: Diagnosis not present

## 2021-12-16 DIAGNOSIS — G629 Polyneuropathy, unspecified: Secondary | ICD-10-CM | POA: Diagnosis not present

## 2021-12-16 DIAGNOSIS — I10 Essential (primary) hypertension: Secondary | ICD-10-CM | POA: Diagnosis not present

## 2021-12-20 DIAGNOSIS — Z9181 History of falling: Secondary | ICD-10-CM | POA: Diagnosis not present

## 2021-12-20 DIAGNOSIS — S80922D Unspecified superficial injury of left lower leg, subsequent encounter: Secondary | ICD-10-CM | POA: Diagnosis not present

## 2021-12-20 DIAGNOSIS — Z7902 Long term (current) use of antithrombotics/antiplatelets: Secondary | ICD-10-CM | POA: Diagnosis not present

## 2021-12-20 DIAGNOSIS — W19XXXD Unspecified fall, subsequent encounter: Secondary | ICD-10-CM | POA: Diagnosis not present

## 2021-12-20 DIAGNOSIS — D472 Monoclonal gammopathy: Secondary | ICD-10-CM | POA: Diagnosis not present

## 2021-12-20 DIAGNOSIS — G2581 Restless legs syndrome: Secondary | ICD-10-CM | POA: Diagnosis not present

## 2021-12-20 DIAGNOSIS — I2510 Atherosclerotic heart disease of native coronary artery without angina pectoris: Secondary | ICD-10-CM | POA: Diagnosis not present

## 2021-12-20 DIAGNOSIS — E785 Hyperlipidemia, unspecified: Secondary | ICD-10-CM | POA: Diagnosis not present

## 2021-12-20 DIAGNOSIS — I10 Essential (primary) hypertension: Secondary | ICD-10-CM | POA: Diagnosis not present

## 2021-12-20 DIAGNOSIS — E876 Hypokalemia: Secondary | ICD-10-CM | POA: Diagnosis not present

## 2021-12-20 DIAGNOSIS — K219 Gastro-esophageal reflux disease without esophagitis: Secondary | ICD-10-CM | POA: Diagnosis not present

## 2021-12-20 DIAGNOSIS — M1990 Unspecified osteoarthritis, unspecified site: Secondary | ICD-10-CM | POA: Diagnosis not present

## 2021-12-20 DIAGNOSIS — G629 Polyneuropathy, unspecified: Secondary | ICD-10-CM | POA: Diagnosis not present

## 2021-12-20 DIAGNOSIS — E039 Hypothyroidism, unspecified: Secondary | ICD-10-CM | POA: Diagnosis not present

## 2021-12-20 DIAGNOSIS — I69334 Monoplegia of upper limb following cerebral infarction affecting left non-dominant side: Secondary | ICD-10-CM | POA: Diagnosis not present

## 2021-12-20 DIAGNOSIS — F419 Anxiety disorder, unspecified: Secondary | ICD-10-CM | POA: Diagnosis not present

## 2021-12-20 DIAGNOSIS — M48062 Spinal stenosis, lumbar region with neurogenic claudication: Secondary | ICD-10-CM | POA: Diagnosis not present

## 2021-12-20 DIAGNOSIS — M80012D Age-related osteoporosis with current pathological fracture, left shoulder, subsequent encounter for fracture with routine healing: Secondary | ICD-10-CM | POA: Diagnosis not present

## 2021-12-20 DIAGNOSIS — E871 Hypo-osmolality and hyponatremia: Secondary | ICD-10-CM | POA: Diagnosis not present

## 2021-12-22 DIAGNOSIS — I69334 Monoplegia of upper limb following cerebral infarction affecting left non-dominant side: Secondary | ICD-10-CM | POA: Diagnosis not present

## 2021-12-22 DIAGNOSIS — I2510 Atherosclerotic heart disease of native coronary artery without angina pectoris: Secondary | ICD-10-CM | POA: Diagnosis not present

## 2021-12-22 DIAGNOSIS — S80922D Unspecified superficial injury of left lower leg, subsequent encounter: Secondary | ICD-10-CM | POA: Diagnosis not present

## 2021-12-22 DIAGNOSIS — M48062 Spinal stenosis, lumbar region with neurogenic claudication: Secondary | ICD-10-CM | POA: Diagnosis not present

## 2021-12-22 DIAGNOSIS — I10 Essential (primary) hypertension: Secondary | ICD-10-CM | POA: Diagnosis not present

## 2021-12-22 DIAGNOSIS — M80012D Age-related osteoporosis with current pathological fracture, left shoulder, subsequent encounter for fracture with routine healing: Secondary | ICD-10-CM | POA: Diagnosis not present

## 2021-12-23 NOTE — Progress Notes (Signed)
[facility] Summary Report / Loop Recorder 

## 2021-12-28 DIAGNOSIS — M48062 Spinal stenosis, lumbar region with neurogenic claudication: Secondary | ICD-10-CM | POA: Diagnosis not present

## 2021-12-28 DIAGNOSIS — S80922D Unspecified superficial injury of left lower leg, subsequent encounter: Secondary | ICD-10-CM | POA: Diagnosis not present

## 2021-12-28 DIAGNOSIS — I10 Essential (primary) hypertension: Secondary | ICD-10-CM | POA: Diagnosis not present

## 2021-12-28 DIAGNOSIS — M80012D Age-related osteoporosis with current pathological fracture, left shoulder, subsequent encounter for fracture with routine healing: Secondary | ICD-10-CM | POA: Diagnosis not present

## 2021-12-28 DIAGNOSIS — I69334 Monoplegia of upper limb following cerebral infarction affecting left non-dominant side: Secondary | ICD-10-CM | POA: Diagnosis not present

## 2021-12-28 DIAGNOSIS — I2510 Atherosclerotic heart disease of native coronary artery without angina pectoris: Secondary | ICD-10-CM | POA: Diagnosis not present

## 2021-12-30 DIAGNOSIS — I69334 Monoplegia of upper limb following cerebral infarction affecting left non-dominant side: Secondary | ICD-10-CM | POA: Diagnosis not present

## 2021-12-30 DIAGNOSIS — S80922D Unspecified superficial injury of left lower leg, subsequent encounter: Secondary | ICD-10-CM | POA: Diagnosis not present

## 2021-12-30 DIAGNOSIS — M48062 Spinal stenosis, lumbar region with neurogenic claudication: Secondary | ICD-10-CM | POA: Diagnosis not present

## 2021-12-30 DIAGNOSIS — I10 Essential (primary) hypertension: Secondary | ICD-10-CM | POA: Diagnosis not present

## 2021-12-30 DIAGNOSIS — I2510 Atherosclerotic heart disease of native coronary artery without angina pectoris: Secondary | ICD-10-CM | POA: Diagnosis not present

## 2021-12-30 DIAGNOSIS — M80012D Age-related osteoporosis with current pathological fracture, left shoulder, subsequent encounter for fracture with routine healing: Secondary | ICD-10-CM | POA: Diagnosis not present

## 2022-01-02 NOTE — Progress Notes (Signed)
Cardiology Office Note:    Date:  01/05/2022   ID:  [name], DOB June 11, 1933, MRN [id number]  PCP:  Burnard Bunting, MD  Cardiologist:  Sinclair Grooms, MD   Referring MD: Burnard Bunting, MD   Chief Complaint  Patient presents with   Advice Only    CVA Loop recorder   Coronary Artery Disease   Hypertension   PVC    History of Present Illness:    [name] is a 88 y.o. 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  Loop recorder implanted 09/03/2021 after she was referred by Richardson Dopp, PA-C.Marland Kitchen No AF noted on down load 10/28/2021.  She has high burden PVCs on the monitor that was done after she had a stroke.  She subsequently saw Dr. Quentin Ore who seemed to have no concern about the PVC burden.  On exam today she has noticeable irregularity and rhythm associated with premature beats.  She denies angina, orthopnea, and edema.  She does have dyspnea on exertion.  In 2013 she had CT coronary angiography and did not have any obstructive disease.  Her calcium score that time was 1.0.  Past Medical History:  Diagnosis Date   Anxiety    Arthritis    Chronic headaches    hx - now just occasional HA per patient   Colon polyps    Complication of anesthesia    Elevated blood pressure after having last Kyphoplasty; pt stated "I was given Morphine and had to stay overnight"   Coronary artery disease    Depression    DI (detrusor instability)    Fracture of vertebra    x 3   GERD (gastroesophageal reflux disease)    History of blood transfusion 2011   w/ knee replacement surgery   History of hiatal hernia    small - does not cause any problems per patient   HLD (hyperlipidemia)    Hypertension    Hypothyroidism    Menopausal symptoms    MGUS (monoclonal gammopathy of unknown significance) 12/27/2017   IgA   Osteoporosis    Peripheral neuropathy 12/27/2017   Pneumonia    Restless leg  syndrome    Stroke ([place]) 03/2021   mini strokes   TMJ click    Varicose veins     Past Surgical History:  Procedure Laterality Date   ABDOMINAL HYSTERECTOMY  1992   TAH,BSO   BLADDER SUSPENSION  2011   CRYOMESH   CARPAL TUNNEL RELEASE Bilateral 1982   CHOLECYSTECTOMY  1992   COLONOSCOPY     EYE SURGERY Bilateral    Cataract removal   IR THORACENTESIS ASP PLEURAL SPACE W/IMG GUIDE  05/28/2021   KYPHOPLASTY     X 3   KYPHOPLASTY N/A 08/02/2021   Procedure: Thoracic Twelve KYPHOPLASTY;  Surgeon: Kristeen Miss, MD;  Location: [facility] OR;  Service: Neurosurgery;  Laterality: N/A;   LUMBAR LAMINECTOMY WITH COFLEX 1 LEVEL N/A 12/29/2015   Procedure: L3-4 Laminectomy with Coflex;  Surgeon: Kristeen Miss, MD;  Location: [facility];  Service: Neurosurgery;  Laterality: N/A;  L3-4 Laminectomy with coflex   OOPHORECTOMY     BSO   REPLACEMENT TOTAL KNEE Bilateral 2008,  2011   RIGHT 2008, LEFT 2011   REVERSE SHOULDER ARTHROPLASTY Right 01/30/2020   Procedure: REVERSE SHOULDER ARTHROPLASTY;  Surgeon: Nicholes Stairs, MD;  Location: [facility];  Service: Orthopedics;  Laterality: Right;  2.5 hrs   SPINAL FUSION  2011   VERTEBRAL SURGERY     T-8, T-10. T-12  DR Roselee Culver    Current Medications: Current Meds  Medication Sig   ALPRAZolam (XANAX) 0.5 MG tablet Take 0.25 mg by mouth at bedtime as needed (restless legs).   amLODipine (NORVASC) 5 MG tablet Take 5 mg by mouth daily.   amoxicillin (AMOXIL) 500 MG capsule Take 2,000 mg by mouth as needed (dental). Take 4 capsules (2000 mg) by mouth one hour prior to dental appointments   Ascorbic Acid (VITAMIN C PO) Take 1 tablet by mouth daily.   Calcium Carbonate-Vitamin D (CALCIUM-D PO) Take 1 tablet by mouth daily.   celecoxib (CELEBREX) 200 MG capsule Take 1 tablet by mouth as needed.   clopidogrel (PLAVIX) 75 MG tablet Take 1 tablet (75 mg total) by mouth daily.   dexlansoprazole (DEXILANT) 60 MG capsule Take 60 mg by mouth every morning.    escitalopram (LEXAPRO) 10 MG tablet Take 10 mg by mouth at bedtime.   levothyroxine (SYNTHROID, LEVOTHROID) 25 MCG tablet Take 25 mcg by mouth daily before breakfast.    metoprolol succinate (TOPROL-XL) 25 MG 24 hr tablet Take 25 mg by mouth every morning.   Multiple Vitamin (MULTIVITAMIN WITH MINERALS) TABS tablet Take 1 tablet by mouth daily.   NONFORMULARY OR COMPOUNDED ITEM Apply 1 application  topically See admin instructions. Transdermal therapeutics  meloxicam 0.5%, doxepin 3%, amantadine 3%, dextromethorphan 2%, lidocaine 2%. - compounded at [facility]:  Apply topically to legs daily after showering for neuropathy   Polyvinyl Alcohol-Povidone (REFRESH OP) Place 1 drop into both eyes daily as needed (dry eyes).   rosuvastatin (CRESTOR) 20 MG tablet Take 1 tablet (20 mg total) by mouth daily.   triamterene-hydrochlorothiazide (MAXZIDE-25) 37.5-25 MG tablet Take 1 tablet by mouth every morning. Hold this medication for now, please see your pcp for hospital discharge follow up, repeat sodium level, please discussion resumption or discontinuation this meds (Patient taking differently: Take 1 tablet by mouth every morning.)   Vitamin D, Ergocalciferol, (DRISDOL) 50000 UNITS CAPS Take 50,000 Units by mouth every Friday.   VITAMIN E PO Take 1 capsule by mouth daily.   Wheat Dextrin (BENEFIBER) POWD Take 1 daily dose   zolpidem (AMBIEN CR) 12.5 MG CR tablet Take 12.5 mg by mouth at bedtime.     Allergies:   Patient has no known allergies.   Social History   Socioeconomic History   Marital status: Widowed    Spouse name: Not on file   Number of children: 2   Years of education: 60   Highest education level: Not on file  Occupational History   Occupation: Retired  Tobacco Use   Smoking status: Never   Smokeless tobacco: Never  Vaping Use   Vaping Use: Never used  Substance and Sexual Activity   Alcohol use: No   Drug use: No   Sexual activity: Yes    Birth  control/protection: Surgical    Comment: Hysterectomy  Other Topics Concern   Not on file  Social History Narrative   Lives  alone   Caffeine use: decaf only   Right handed    Social Determinants of Health   Financial Resource Strain: Not on file  Food Insecurity: Not on file  Transportation Needs: Not on file  Physical Activity: Not on file  Stress: Not on file  Social Connections: Not on file     Family History: The patient's family history includes Breast cancer in her niece; Diabetes in her  sister and sister; Heart disease in her brother, brother, brother, brother, brother, brother, father, mother, and son; Hyperlipidemia in her daughter, sister, sister, and son; Hypertension in her brother, brother, daughter, sister, sister, and sister; Ovarian cancer in her sister; Rectal cancer in her sister; Stroke in her father.  ROS:   Please see the history of present illness.    No specific cardiac complaints.  She has decreased energy.  She fractured her left shoulder in a fall.  She had a vertebral fracture in a fall.  Had a vertebral plasty.  All other systems reviewed and are negative.  EKGs/Labs/Other Studies Reviewed:    The following studies were reviewed today: Echocardiogram 04/21/21 EF 60-65, no RWMA, Gr 1 DD, normal RVSF, trivial MR, trivial AI  30 Day Cardiac Telemetry 06/03/2021: Highlights    Normal sinus rhythm Frequent PVC's with 18% burden PAC's with 3% burden No sustained tachyarrhythmia No atrial fibrillation Some symptoms of flutter and heart racing correlate with PAC's and PVC's.   No atrial fibrillation High burden PVC's  EKG:  EKG not performed  Recent Labs: 04/21/2021: TSH 1.522 04/22/2021: Magnesium 2.2 11/11/2021: ALT 15; BUN 16; Creatinine 0.81; Hemoglobin 13.3; Platelet Count 289; Potassium 3.7; Sodium 131  Recent Lipid Panel    Component Value Date/Time   CHOL 172 07/22/2021 0934   TRIG 99 07/22/2021 0934   HDL 89 07/22/2021 0934    CHOLHDL 1.9 07/22/2021 0934   VLDL 20 07/22/2021 0934   LDLCALC 63 07/22/2021 0934    Physical Exam:    VS:  BP 124/64   Pulse 82   Ht '5\' 1"'$  (1.549 m)   Wt 133 lb (60.3 kg)   SpO2 95%   BMI 25.13 kg/m     Wt Readings from Last 3 Encounters:  01/05/22 133 lb (60.3 kg)  11/11/21 132 lb 1.9 oz (59.9 kg)  09/03/21 134 lb (60.8 kg)     GEN: Compatible with age, frail.. No acute distress HEENT: Normal NECK: No JVD. LYMPHATICS: No lymphadenopathy CARDIAC: No murmur. RRR no gallop, or edema. VASCULAR:  Normal Pulses. No bruits. RESPIRATORY:  Clear to auscultation without rales, wheezing or rhonchi  ABDOMEN: Soft, non-tender, non-distended, No pulsatile mass, MUSCULOSKELETAL: No deformity  SKIN: Warm and dry NEUROLOGIC:  Alert and oriented x 3 PSYCHIATRIC:  Normal affect   ASSESSMENT:    1. Coronary artery disease involving native coronary artery of native heart without angina pectoris   2. Essential hypertension   3. Mixed hyperlipidemia   4. PVC's (premature ventricular contractions)    PLAN:    In order of problems listed above:  Minimal nonobstructive atherosclerosis from remote evaluation.  No symptoms currently to suggest any worsening or obstructive disease. Blood pressure is stable. Did not discuss her lipids. Having PVCs now.  Had 18% burden on recent monitor.  No particular action has been taken.   If she develops atrial fibrillation on loop recorder, will need to be managed.  Otherwise cardiology follow-up can be as needed in the future.  I did inform her that I am retiring.   Medication Adjustments/Labs and Tests Ordered: Current medicines are reviewed at length with the patient today.  Concerns regarding medicines are outlined above.  No orders of the defined types were placed in this encounter.  No orders of the defined types were placed in this encounter.   There are no Patient Instructions on file for this visit.   Signed, Sinclair Grooms, MD   01/05/2022 1:45 PM  [facility]

## 2022-01-03 ENCOUNTER — Ambulatory Visit (INDEPENDENT_AMBULATORY_CARE_PROVIDER_SITE_OTHER): Payer: Medicare Other

## 2022-01-03 DIAGNOSIS — I639 Cerebral infarction, unspecified: Secondary | ICD-10-CM

## 2022-01-03 LAB — CUP PACEART REMOTE DEVICE CHECK
Date Time Interrogation Session: 20230806092738
Implantable Pulse Generator Implant Date: 20230407

## 2022-01-04 DIAGNOSIS — Z90711 Acquired absence of uterus with remaining cervical stump: Secondary | ICD-10-CM | POA: Diagnosis not present

## 2022-01-04 DIAGNOSIS — Z0142 Encounter for cervical smear to confirm findings of recent normal smear following initial abnormal smear: Secondary | ICD-10-CM | POA: Diagnosis not present

## 2022-01-04 DIAGNOSIS — M48062 Spinal stenosis, lumbar region with neurogenic claudication: Secondary | ICD-10-CM | POA: Diagnosis not present

## 2022-01-04 DIAGNOSIS — Z01419 Encounter for gynecological examination (general) (routine) without abnormal findings: Secondary | ICD-10-CM | POA: Diagnosis not present

## 2022-01-04 DIAGNOSIS — I69334 Monoplegia of upper limb following cerebral infarction affecting left non-dominant side: Secondary | ICD-10-CM | POA: Diagnosis not present

## 2022-01-04 DIAGNOSIS — M80012D Age-related osteoporosis with current pathological fracture, left shoulder, subsequent encounter for fracture with routine healing: Secondary | ICD-10-CM | POA: Diagnosis not present

## 2022-01-04 DIAGNOSIS — R140 Abdominal distension (gaseous): Secondary | ICD-10-CM | POA: Diagnosis not present

## 2022-01-04 DIAGNOSIS — Z124 Encounter for screening for malignant neoplasm of cervix: Secondary | ICD-10-CM | POA: Diagnosis not present

## 2022-01-04 DIAGNOSIS — Z1231 Encounter for screening mammogram for malignant neoplasm of breast: Secondary | ICD-10-CM | POA: Diagnosis not present

## 2022-01-04 DIAGNOSIS — I10 Essential (primary) hypertension: Secondary | ICD-10-CM | POA: Diagnosis not present

## 2022-01-04 DIAGNOSIS — S80922D Unspecified superficial injury of left lower leg, subsequent encounter: Secondary | ICD-10-CM | POA: Diagnosis not present

## 2022-01-04 DIAGNOSIS — Z01411 Encounter for gynecological examination (general) (routine) with abnormal findings: Secondary | ICD-10-CM | POA: Diagnosis not present

## 2022-01-04 DIAGNOSIS — I2510 Atherosclerotic heart disease of native coronary artery without angina pectoris: Secondary | ICD-10-CM | POA: Diagnosis not present

## 2022-01-05 ENCOUNTER — Encounter: Payer: Self-pay | Admitting: Interventional Cardiology

## 2022-01-05 ENCOUNTER — Ambulatory Visit (INDEPENDENT_AMBULATORY_CARE_PROVIDER_SITE_OTHER): Payer: Medicare Other | Admitting: Interventional Cardiology

## 2022-01-05 VITALS — BP 124/64 | HR 82 | Ht 61.0 in | Wt 133.0 lb

## 2022-01-05 DIAGNOSIS — I639 Cerebral infarction, unspecified: Secondary | ICD-10-CM | POA: Diagnosis not present

## 2022-01-05 DIAGNOSIS — I2510 Atherosclerotic heart disease of native coronary artery without angina pectoris: Secondary | ICD-10-CM

## 2022-01-05 DIAGNOSIS — E782 Mixed hyperlipidemia: Secondary | ICD-10-CM | POA: Diagnosis not present

## 2022-01-05 DIAGNOSIS — I10 Essential (primary) hypertension: Secondary | ICD-10-CM

## 2022-01-05 DIAGNOSIS — I493 Ventricular premature depolarization: Secondary | ICD-10-CM

## 2022-01-05 NOTE — Patient Instructions (Addendum)
Medication Instructions:  Your physician recommends that you continue on your current medications as directed. Please refer to the Current Medication list given to you today.  *If you need a refill on your cardiac medications before your next appointment, please call your pharmacy*  Lab Work: NONE  Testing/Procedures: NONE  Follow-Up: 1 year with Richardson Dopp, PA-C  Important Information About Sugar

## 2022-01-06 DIAGNOSIS — S80922D Unspecified superficial injury of left lower leg, subsequent encounter: Secondary | ICD-10-CM | POA: Diagnosis not present

## 2022-01-06 DIAGNOSIS — M80012D Age-related osteoporosis with current pathological fracture, left shoulder, subsequent encounter for fracture with routine healing: Secondary | ICD-10-CM | POA: Diagnosis not present

## 2022-01-06 DIAGNOSIS — M48062 Spinal stenosis, lumbar region with neurogenic claudication: Secondary | ICD-10-CM | POA: Diagnosis not present

## 2022-01-06 DIAGNOSIS — I10 Essential (primary) hypertension: Secondary | ICD-10-CM | POA: Diagnosis not present

## 2022-01-06 DIAGNOSIS — I2510 Atherosclerotic heart disease of native coronary artery without angina pectoris: Secondary | ICD-10-CM | POA: Diagnosis not present

## 2022-01-06 DIAGNOSIS — I69334 Monoplegia of upper limb following cerebral infarction affecting left non-dominant side: Secondary | ICD-10-CM | POA: Diagnosis not present

## 2022-01-12 DIAGNOSIS — I10 Essential (primary) hypertension: Secondary | ICD-10-CM | POA: Diagnosis not present

## 2022-01-12 DIAGNOSIS — M80012D Age-related osteoporosis with current pathological fracture, left shoulder, subsequent encounter for fracture with routine healing: Secondary | ICD-10-CM | POA: Diagnosis not present

## 2022-01-12 DIAGNOSIS — I2510 Atherosclerotic heart disease of native coronary artery without angina pectoris: Secondary | ICD-10-CM | POA: Diagnosis not present

## 2022-01-12 DIAGNOSIS — S80922D Unspecified superficial injury of left lower leg, subsequent encounter: Secondary | ICD-10-CM | POA: Diagnosis not present

## 2022-01-12 DIAGNOSIS — M48062 Spinal stenosis, lumbar region with neurogenic claudication: Secondary | ICD-10-CM | POA: Diagnosis not present

## 2022-01-12 DIAGNOSIS — I69334 Monoplegia of upper limb following cerebral infarction affecting left non-dominant side: Secondary | ICD-10-CM | POA: Diagnosis not present

## 2022-01-13 DIAGNOSIS — S80922D Unspecified superficial injury of left lower leg, subsequent encounter: Secondary | ICD-10-CM | POA: Diagnosis not present

## 2022-01-13 DIAGNOSIS — S42202D Unspecified fracture of upper end of left humerus, subsequent encounter for fracture with routine healing: Secondary | ICD-10-CM | POA: Diagnosis not present

## 2022-01-13 DIAGNOSIS — M80012D Age-related osteoporosis with current pathological fracture, left shoulder, subsequent encounter for fracture with routine healing: Secondary | ICD-10-CM | POA: Diagnosis not present

## 2022-01-13 DIAGNOSIS — I10 Essential (primary) hypertension: Secondary | ICD-10-CM | POA: Diagnosis not present

## 2022-01-13 DIAGNOSIS — I2510 Atherosclerotic heart disease of native coronary artery without angina pectoris: Secondary | ICD-10-CM | POA: Diagnosis not present

## 2022-01-13 DIAGNOSIS — M48062 Spinal stenosis, lumbar region with neurogenic claudication: Secondary | ICD-10-CM | POA: Diagnosis not present

## 2022-01-13 DIAGNOSIS — I69334 Monoplegia of upper limb following cerebral infarction affecting left non-dominant side: Secondary | ICD-10-CM | POA: Diagnosis not present

## 2022-01-19 DIAGNOSIS — Z9181 History of falling: Secondary | ICD-10-CM | POA: Diagnosis not present

## 2022-01-19 DIAGNOSIS — Z7902 Long term (current) use of antithrombotics/antiplatelets: Secondary | ICD-10-CM | POA: Diagnosis not present

## 2022-01-19 DIAGNOSIS — I2510 Atherosclerotic heart disease of native coronary artery without angina pectoris: Secondary | ICD-10-CM | POA: Diagnosis not present

## 2022-01-19 DIAGNOSIS — I69334 Monoplegia of upper limb following cerebral infarction affecting left non-dominant side: Secondary | ICD-10-CM | POA: Diagnosis not present

## 2022-01-19 DIAGNOSIS — W19XXXD Unspecified fall, subsequent encounter: Secondary | ICD-10-CM | POA: Diagnosis not present

## 2022-01-19 DIAGNOSIS — D472 Monoclonal gammopathy: Secondary | ICD-10-CM | POA: Diagnosis not present

## 2022-01-19 DIAGNOSIS — E039 Hypothyroidism, unspecified: Secondary | ICD-10-CM | POA: Diagnosis not present

## 2022-01-19 DIAGNOSIS — E785 Hyperlipidemia, unspecified: Secondary | ICD-10-CM | POA: Diagnosis not present

## 2022-01-19 DIAGNOSIS — M1990 Unspecified osteoarthritis, unspecified site: Secondary | ICD-10-CM | POA: Diagnosis not present

## 2022-01-19 DIAGNOSIS — E876 Hypokalemia: Secondary | ICD-10-CM | POA: Diagnosis not present

## 2022-01-19 DIAGNOSIS — F419 Anxiety disorder, unspecified: Secondary | ICD-10-CM | POA: Diagnosis not present

## 2022-01-19 DIAGNOSIS — I10 Essential (primary) hypertension: Secondary | ICD-10-CM | POA: Diagnosis not present

## 2022-01-19 DIAGNOSIS — S80922D Unspecified superficial injury of left lower leg, subsequent encounter: Secondary | ICD-10-CM | POA: Diagnosis not present

## 2022-01-19 DIAGNOSIS — G629 Polyneuropathy, unspecified: Secondary | ICD-10-CM | POA: Diagnosis not present

## 2022-01-19 DIAGNOSIS — K219 Gastro-esophageal reflux disease without esophagitis: Secondary | ICD-10-CM | POA: Diagnosis not present

## 2022-01-19 DIAGNOSIS — G2581 Restless legs syndrome: Secondary | ICD-10-CM | POA: Diagnosis not present

## 2022-01-19 DIAGNOSIS — M48062 Spinal stenosis, lumbar region with neurogenic claudication: Secondary | ICD-10-CM | POA: Diagnosis not present

## 2022-01-19 DIAGNOSIS — M80012D Age-related osteoporosis with current pathological fracture, left shoulder, subsequent encounter for fracture with routine healing: Secondary | ICD-10-CM | POA: Diagnosis not present

## 2022-01-19 DIAGNOSIS — E871 Hypo-osmolality and hyponatremia: Secondary | ICD-10-CM | POA: Diagnosis not present

## 2022-01-20 DIAGNOSIS — S80922D Unspecified superficial injury of left lower leg, subsequent encounter: Secondary | ICD-10-CM | POA: Diagnosis not present

## 2022-01-20 DIAGNOSIS — I69334 Monoplegia of upper limb following cerebral infarction affecting left non-dominant side: Secondary | ICD-10-CM | POA: Diagnosis not present

## 2022-01-20 DIAGNOSIS — M80012D Age-related osteoporosis with current pathological fracture, left shoulder, subsequent encounter for fracture with routine healing: Secondary | ICD-10-CM | POA: Diagnosis not present

## 2022-01-20 DIAGNOSIS — I10 Essential (primary) hypertension: Secondary | ICD-10-CM | POA: Diagnosis not present

## 2022-01-20 DIAGNOSIS — M48062 Spinal stenosis, lumbar region with neurogenic claudication: Secondary | ICD-10-CM | POA: Diagnosis not present

## 2022-01-20 DIAGNOSIS — I2510 Atherosclerotic heart disease of native coronary artery without angina pectoris: Secondary | ICD-10-CM | POA: Diagnosis not present

## 2022-01-27 DIAGNOSIS — I2510 Atherosclerotic heart disease of native coronary artery without angina pectoris: Secondary | ICD-10-CM | POA: Diagnosis not present

## 2022-01-27 DIAGNOSIS — S80922D Unspecified superficial injury of left lower leg, subsequent encounter: Secondary | ICD-10-CM | POA: Diagnosis not present

## 2022-01-27 DIAGNOSIS — I10 Essential (primary) hypertension: Secondary | ICD-10-CM | POA: Diagnosis not present

## 2022-01-27 DIAGNOSIS — I69334 Monoplegia of upper limb following cerebral infarction affecting left non-dominant side: Secondary | ICD-10-CM | POA: Diagnosis not present

## 2022-01-27 DIAGNOSIS — M80012D Age-related osteoporosis with current pathological fracture, left shoulder, subsequent encounter for fracture with routine healing: Secondary | ICD-10-CM | POA: Diagnosis not present

## 2022-01-27 DIAGNOSIS — M48062 Spinal stenosis, lumbar region with neurogenic claudication: Secondary | ICD-10-CM | POA: Diagnosis not present

## 2022-01-29 DIAGNOSIS — Z23 Encounter for immunization: Secondary | ICD-10-CM | POA: Diagnosis not present

## 2022-02-03 DIAGNOSIS — I69334 Monoplegia of upper limb following cerebral infarction affecting left non-dominant side: Secondary | ICD-10-CM | POA: Diagnosis not present

## 2022-02-03 DIAGNOSIS — M48062 Spinal stenosis, lumbar region with neurogenic claudication: Secondary | ICD-10-CM | POA: Diagnosis not present

## 2022-02-03 DIAGNOSIS — I10 Essential (primary) hypertension: Secondary | ICD-10-CM | POA: Diagnosis not present

## 2022-02-03 DIAGNOSIS — M80012D Age-related osteoporosis with current pathological fracture, left shoulder, subsequent encounter for fracture with routine healing: Secondary | ICD-10-CM | POA: Diagnosis not present

## 2022-02-03 DIAGNOSIS — I2510 Atherosclerotic heart disease of native coronary artery without angina pectoris: Secondary | ICD-10-CM | POA: Diagnosis not present

## 2022-02-03 DIAGNOSIS — S80922D Unspecified superficial injury of left lower leg, subsequent encounter: Secondary | ICD-10-CM | POA: Diagnosis not present

## 2022-02-07 ENCOUNTER — Ambulatory Visit (INDEPENDENT_AMBULATORY_CARE_PROVIDER_SITE_OTHER): Payer: Medicare Other

## 2022-02-07 DIAGNOSIS — I639 Cerebral infarction, unspecified: Secondary | ICD-10-CM | POA: Diagnosis not present

## 2022-02-08 LAB — CUP PACEART REMOTE DEVICE CHECK
Date Time Interrogation Session: 20230908092406
Implantable Pulse Generator Implant Date: 20230407

## 2022-02-08 NOTE — Progress Notes (Signed)
[facility] Summary Report / Loop Recorder 

## 2022-02-11 ENCOUNTER — Telehealth: Payer: Self-pay

## 2022-02-11 NOTE — Telephone Encounter (Signed)
The patient called stating sometimes at night her heart running away from her. I had Leigh, rn look at her transmission from 02/11/2022 and she did not have any episodes since 10/09/2021. She is having some PVC's 9.4%. Marliss Czar is going to send information to Dr. Quentin Ore and Dr. Tamala Julian to see if they have any recommendations.

## 2022-02-11 NOTE — Telephone Encounter (Signed)
No events noted since Oct 09, 2021. PVC burden noted 9.4%. Note presenting rhythm and time logged was during early am hours.   Routing to Dr. Quentin Ore and Dr Tamala Julian for review.

## 2022-02-14 NOTE — Telephone Encounter (Signed)
Called patient to advise Dr. Quentin Ore would like patient to take metroprolol succinate 25 mg twice a day. Patient voiced understanding.

## 2022-02-17 DIAGNOSIS — Z96653 Presence of artificial knee joint, bilateral: Secondary | ICD-10-CM | POA: Diagnosis not present

## 2022-02-17 DIAGNOSIS — Z471 Aftercare following joint replacement surgery: Secondary | ICD-10-CM | POA: Diagnosis not present

## 2022-02-22 NOTE — Progress Notes (Unsigned)
Patient: [name] Date of Birth: 1934-02-21  Reason for Visit: Follow up History from: Patient Primary Neurologist: Willis/Sethi   ASSESSMENT AND PLAN 88 y.o. year old female   38.  Cryptogenic stroke November 9678, multiple embolic right brain and cerebellar infarcts -Vascular risk factors HTN, HLD, CAD, age -Continue Plavix 75 mg daily for secondary stroke prevention -Goals BP less than 130/90 LDL less than 70, A1c less than 7.0 -Had a loop recorder placed April 2023, no AFIB has been detected   2.  History of chronic IgA MGUS peripheral neuropathy 3.  Chronic gait disorder 4.  Chronic back pain, post kyphoplasty March 2023 with Dr. Ellene Route -Not on any medication, has previously tried gabapentin, Lyrica, Cymbalta, Keppra, Effexor, compound cream -Seeing oncology tomorrow -Discussed need to be careful with ambulation, just finished home health PT, continues to do her exercises -Remains quite active, lives alone, drives a car, I am very impressed by her -I will see her back in 6 months or sooner if needed  HISTORY OF PRESENT ILLNESS: Today 02/23/22 [name] is here today for follow-up.  Was previously seen by myself and Dr. Jannifer Franklin, last visit was in May 2022 for history of IgA MGUS, peripheral neuropathy, gait disorder.  Follows with oncology for IgA kappa MGUS.  Has not been able to tolerate multiple medications for neuropathy.  Has used topical cream with good benefit.  In November 2023 she presented to the ER with left lower extremity weakness, MRI showed multiple small acute infarcts in the right greater than left cerebral hemispheres, also involving right cerebellum concerning for embolic process.  CT angiogram of the head and neck showed beaded appearance of both terminal carotids raising concern for fibromuscular dysplasia.  No A-fib was seen on cardiac monitor. On Plavix 75 mg daily.   In March 2023 Dr. Ellene Route performed kyphoplasty for T12 compression fracture with  good benefit.  Had a fall 3 months ago fell broke left humerus. Her grandson suddenly passed away over Easter, he was helping to take care of her. She is living alone, she is driving. Since the stroke the left leg is still slightly weak in combination with the neuropathy. Is careful with walking, uses a cane. Home health just finished 1 month ago, she wore a sling. Sees Dr. Marin Olp tomorrow, last saw in June, IgA kappa MGUS is still low level, could be age alone. Not on any medication for neuropathy, her legs burn, the cream doesn't work. Wears support socks.   BP up today here but was 122/72 at gynecologist earlier.   Generally sleeps well, sometimes feet wake her up burning, will use wet cool wash cloth. [name] is delightful.   HISTORY  Copied Dr. Leonie Man 08/18/21 HPI: [name] is a 88 year old pleasant Caucasian lady seen today for office consultation visit for stroke.  History is obtained from the patient and review of electronic medical records and opossum reviewed pertinent available imaging films in PACS.  She  has a past medical history of Arthritis, Complication of anesthesia, Coronary artery disease, DI (detrusor instability), Fracture of vertebra, GERD (gastroesophageal reflux disease), History of hiatal hernia, Hypertension, Hypothyroidism, Menopausal symptoms, MGUS (monoclonal gammopathy of unknown significance) (12/27/2017), Osteoporosis, Peripheral neuropathy (12/27/2017), Peripheral neuropathy, Pneumonia, Restless leg syndrome, and Varicose veins.  She presented to [facility] emergency room on 04/19/2022 for evaluation of left lower extremity weakness.  This gradually worsened over the last 2 days.  She was barely able to walk on the day of presentation  and unable to lift the leg off the bed when she tried to stand she fell.  MRI scan of the brain was obtained which showed multiple small acute infarcts in right greater than left cerebral hemispheres and also involving right cerebellum and  multiple vascular territories concerning for central embolic process.  CT angiogram of the brain and neck showed no large vessel stenosis or occlusion but there was beaded appearance of both terminal carotids raising concern for fibromuscular dysplasia.  Echocardiogram showed ejection fraction of 60 to 65%.  Left atrial size was not dilated.  LDL cholesterol was 63 mg percent and hemoglobin A1c was 5.3.  She subsequently had a 30-day heart monitor done and no paroxysmal A-fib was found.  Patient was discharged home and underwent home physical and occupational therapy for for 3 months and is able to ambulate well now.  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  She can walk short distances unassisted and uses a cane for outdoors and long distances.  The patient did have a fall in her bathtub and fractured some ribs.  She was seen in December 2022 and found to have significant right-sided hemorrhagic pleural effusion and 1.2 L but after radiology.  She was also found to have T12 compression fracture and she underwent acrylic balloon kyphoplasty by Dr. Ellene Route on 08/02/2021 for relief of her back pain which has been refractory to medical therapies.  She states her back pain is much better now and she can ambulate quite well.  She has a longstanding history of peripheral neuropathy with hypersensitivity in the stocking distribution and poor balance.  She has seen Dr. Jannifer Franklin for the same.  She states her neuropathy symptoms are unchanged.  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  REVIEW OF SYSTEMS: Out of a complete 14 system review of symptoms, the patient complains only of the following symptoms, and all other reviewed systems are negative.  See HPI  ALLERGIES: No Known Allergies  HOME MEDICATIONS: Outpatient Medications Prior to Visit  Medication Sig Dispense Refill   ALPRAZolam (XANAX) 0.5 MG tablet Take 0.25 mg by mouth 2 (two)  times daily as needed (restless legs).     amLODipine (NORVASC) 5 MG tablet Take 5 mg by mouth daily.     amoxicillin (AMOXIL) 500 MG capsule Take 2,000 mg by mouth as needed (dental). Take 4 capsules (2000 mg) by mouth one hour prior to dental appointments     Ascorbic Acid (VITAMIN C PO) Take 1 tablet by mouth daily.     Calcium Carbonate-Vitamin D (CALCIUM-D PO) Take 1 tablet by mouth daily.     celecoxib (CELEBREX) 200 MG capsule Take 1 tablet by mouth as needed.     clopidogrel (PLAVIX) 75 MG tablet Take 1 tablet (75 mg total) by mouth daily. 30 tablet 3   dexlansoprazole (DEXILANT) 60 MG capsule Take 60 mg by mouth every morning.     escitalopram (LEXAPRO) 10 MG tablet Take 10 mg by mouth at bedtime.     levothyroxine (SYNTHROID, LEVOTHROID) 25 MCG tablet Take 25 mcg by mouth daily before breakfast.      metoprolol succinate (TOPROL-XL) 25 MG 24 hr tablet Take 25 mg by mouth in the morning and at bedtime.     Multiple Vitamin (MULTIVITAMIN WITH MINERALS) TABS tablet Take 1 tablet by mouth daily.     NONFORMULARY OR COMPOUNDED ITEM Apply 1 application  topically See admin instructions.  Transdermal therapeutics  meloxicam 0.5%, doxepin 3%, amantadine 3%, dextromethorphan 2%, lidocaine 2%. - compounded at [place]:  Apply topically to legs daily after showering for neuropathy     Polyvinyl Alcohol-Povidone (REFRESH OP) Place 1 drop into both eyes daily as needed (dry eyes).     rosuvastatin (CRESTOR) 20 MG tablet Take 1 tablet (20 mg total) by mouth daily. 30 tablet 0   triamterene-hydrochlorothiazide (MAXZIDE-25) 37.5-25 MG tablet Take 1 tablet by mouth every morning. Hold this medication for now, please see your pcp for hospital discharge follow up, repeat sodium level, please discussion resumption or discontinuation this meds (Patient taking differently: Take 1 tablet by mouth every morning.)     Vitamin D, Ergocalciferol, (DRISDOL) 50000 UNITS CAPS Take 50,000 Units by mouth every  Friday.     VITAMIN E PO Take 1 capsule by mouth daily.     Wheat Dextrin (BENEFIBER) POWD Take 1 daily dose 500 g 0   zolpidem (AMBIEN CR) 12.5 MG CR tablet Take 12.5 mg by mouth at bedtime.     No facility-administered medications prior to visit.    PAST MEDICAL HISTORY: Past Medical History:  Diagnosis Date   Anxiety    Arthritis    Chronic headaches    hx - now just occasional HA per patient   Colon polyps    Complication of anesthesia    Elevated blood pressure after having last Kyphoplasty; pt stated "I was given Morphine and had to stay overnight"   Coronary artery disease    Depression    DI (detrusor instability)    Fracture of vertebra    x 3   GERD (gastroesophageal reflux disease)    History of blood transfusion 2011   w/ knee replacement surgery   History of hiatal hernia    small - does not cause any problems per patient   HLD (hyperlipidemia)    Hypertension    Hypothyroidism    Menopausal symptoms    MGUS (monoclonal gammopathy of unknown significance) 12/27/2017   IgA   Osteoporosis    Peripheral neuropathy 12/27/2017   Pneumonia    Restless leg syndrome    Stroke ([place]) 03/2021   mini strokes   TMJ click    Varicose veins     PAST SURGICAL HISTORY: Past Surgical History:  Procedure Laterality Date   ABDOMINAL HYSTERECTOMY  1992   TAH,BSO   BLADDER SUSPENSION  2011   CRYOMESH   CARPAL TUNNEL RELEASE Bilateral 1982   CHOLECYSTECTOMY  1992   COLONOSCOPY     EYE SURGERY Bilateral    Cataract removal   IR THORACENTESIS ASP PLEURAL SPACE W/IMG GUIDE  05/28/2021   KYPHOPLASTY     X 3   KYPHOPLASTY N/A 08/02/2021   Procedure: Thoracic Twelve KYPHOPLASTY;  Surgeon: Kristeen Miss, MD;  Location: [facility];  Service: Neurosurgery;  Laterality: N/A;   LUMBAR LAMINECTOMY WITH COFLEX 1 LEVEL N/A 12/29/2015   Procedure: L3-4 Laminectomy with Coflex;  Surgeon: Kristeen Miss, MD;  Location: [facility];  Service: Neurosurgery;  Laterality: N/A;  L3-4  Laminectomy with coflex   OOPHORECTOMY     BSO   REPLACEMENT TOTAL KNEE Bilateral 2008,  2011   RIGHT 2008, LEFT 2011   REVERSE SHOULDER ARTHROPLASTY Right 01/30/2020   Procedure: REVERSE SHOULDER ARTHROPLASTY;  Surgeon: Nicholes Stairs, MD;  Location: [facility];  Service: Orthopedics;  Laterality: Right;  2.5 hrs   SPINAL FUSION  2011   VERTEBRAL SURGERY  T-8, T-10. T-12  DR Roselee Culver    FAMILY HISTORY: Family History  Problem Relation Age of Onset   Heart disease Mother    Heart disease Father    Stroke Father    Hypertension Sister    Diabetes Sister    Ovarian cancer Sister    Diabetes Sister    Rectal cancer Sister        ? Colon or rectal   Hyperlipidemia Sister    Hypertension Sister    Hyperlipidemia Sister    Hypertension Sister    Hypertension Brother    Heart disease Brother    Heart disease Brother    Hypertension Brother    Heart disease Brother    Heart disease Brother    Heart disease Brother    Heart disease Brother    Breast cancer Niece    Heart disease Son    Hyperlipidemia Son    Hyperlipidemia Daughter    Hypertension Daughter     SOCIAL HISTORY: Social History   Socioeconomic History   Marital status: Widowed    Spouse name: Not on file   Number of children: 2   Years of education: 49   Highest education level: Not on file  Occupational History   Occupation: Retired  Tobacco Use   Smoking status: Never   Smokeless tobacco: Never  Vaping Use   Vaping Use: Never used  Substance and Sexual Activity   Alcohol use: No   Drug use: No   Sexual activity: Yes    Birth control/protection: Surgical    Comment: Hysterectomy  Other Topics Concern   Not on file  Social History Narrative   Lives  alone   Caffeine use: decaf only   Right handed    Social Determinants of Health   Financial Resource Strain: Not on file  Food Insecurity: Not on file  Transportation Needs: Not on file  Physical Activity: Not on file  Stress: Not on  file  Social Connections: Not on file  Intimate Partner Violence: Not on file   PHYSICAL EXAM  Vitals:   02/23/22 1309  BP: (!) 151/91  Pulse: (!) 54  Weight: 136 lb (61.7 kg)  Height: '5\' 1"'$  (1.549 m)   Body mass index is 25.7 kg/m.  Generalized: Well developed, in no acute distress, well-appearing, dressed Neurological examination  Mentation: Alert oriented to time, place, history taking. Follows all commands speech and language fluent.  Very pleasant, interactive. Cranial nerve II-XII: Pupils were equal round reactive to light. Extraocular movements were full, visual field were full on confrontational test. Facial sensation and strength were normal. Head turning and shoulder shrug were normal and symmetric. Motor: The motor testing reveals 5 over 5 strength of all 4 extremities. Good symmetric motor tone is noted throughout.  Sensory: Sensory testing is intact to soft touch on all 4 extremities. No evidence of extinction is noted.  Coordination: Cerebellar testing reveals good finger-nose-finger and heel-to-shin bilaterally.  Gait and station: Gait is mildly wide-based, can walk short distances independently, in the hallway uses single-point cane. Reflexes: Deep tendon reflexes are symmetric but depressed  DIAGNOSTIC DATA (LABS, IMAGING, TESTING) - I reviewed patient records, labs, notes, testing and imaging myself where available.  Lab Results  Component Value Date   WBC 3.9 (L) 11/11/2021   HGB 13.3 11/11/2021   HCT 40.3 11/11/2021   MCV 95.0 11/11/2021   PLT 289 11/11/2021      Component Value Date/Time   NA 131 (L) 11/11/2021 1011  NA 133 (L) 07/06/2021 1528   K 3.7 11/11/2021 1011   CL 92 (L) 11/11/2021 1011   CO2 32 11/11/2021 1011   GLUCOSE 85 11/11/2021 1011   BUN 16 11/11/2021 1011   BUN 14 07/06/2021 1528   CREATININE 0.81 11/11/2021 1011   CALCIUM 10.3 11/11/2021 1011   PROT 7.3 11/11/2021 1011   PROT 6.6 11/23/2017 0852   ALBUMIN 4.6 11/11/2021 1011    AST 23 11/11/2021 1011   ALT 15 11/11/2021 1011   ALKPHOS 86 11/11/2021 1011   BILITOT 0.5 11/11/2021 1011   GFRNONAA >60 11/11/2021 1011   GFRAA 55 (L) 01/22/2020 1151   GFRAA >60 12/06/2019 0949   Lab Results  Component Value Date   CHOL 172 07/22/2021   HDL 89 07/22/2021   LDLCALC 63 07/22/2021   TRIG 99 07/22/2021   CHOLHDL 1.9 07/22/2021   Lab Results  Component Value Date   HGBA1C 5.3 04/21/2021   Lab Results  Component Value Date   VITAMINB12 2,378 (H) 08/13/2018   Lab Results  Component Value Date   TSH 1.522 04/21/2021    Butler Denmark, AGNP-C, DNP 02/23/2022, 1:48 PM [facility] [address] [place], [place] [address] [phone]

## 2022-02-23 ENCOUNTER — Ambulatory Visit (INDEPENDENT_AMBULATORY_CARE_PROVIDER_SITE_OTHER): Payer: Medicare Other | Admitting: Neurology

## 2022-02-23 ENCOUNTER — Encounter: Payer: Self-pay | Admitting: Neurology

## 2022-02-23 VITALS — BP 151/91 | HR 54 | Ht 61.0 in | Wt 136.0 lb

## 2022-02-23 DIAGNOSIS — D472 Monoclonal gammopathy: Secondary | ICD-10-CM | POA: Diagnosis not present

## 2022-02-23 DIAGNOSIS — G603 Idiopathic progressive neuropathy: Secondary | ICD-10-CM | POA: Diagnosis not present

## 2022-02-23 DIAGNOSIS — Z8673 Personal history of transient ischemic attack (TIA), and cerebral infarction without residual deficits: Secondary | ICD-10-CM

## 2022-02-23 DIAGNOSIS — R971 Elevated cancer antigen 125 [CA 125]: Secondary | ICD-10-CM | POA: Diagnosis not present

## 2022-02-23 NOTE — Patient Instructions (Signed)
Great to see you today Keep BP < 130/90, LDL < 70, A1C < 7.0 Be careful not to fall, use your cane See you back in 6 months

## 2022-02-24 ENCOUNTER — Other Ambulatory Visit: Payer: Self-pay

## 2022-02-24 ENCOUNTER — Other Ambulatory Visit: Payer: Self-pay | Admitting: *Deleted

## 2022-02-24 ENCOUNTER — Inpatient Hospital Stay: Payer: Medicare Other | Attending: Hematology & Oncology

## 2022-02-24 ENCOUNTER — Inpatient Hospital Stay (HOSPITAL_BASED_OUTPATIENT_CLINIC_OR_DEPARTMENT_OTHER): Payer: Medicare Other | Admitting: Hematology & Oncology

## 2022-02-24 ENCOUNTER — Encounter: Payer: Self-pay | Admitting: Hematology & Oncology

## 2022-02-24 ENCOUNTER — Telehealth: Payer: Self-pay | Admitting: *Deleted

## 2022-02-24 VITALS — BP 129/77 | HR 60 | Temp 97.9°F | Resp 18 | Ht 61.0 in | Wt 134.1 lb

## 2022-02-24 DIAGNOSIS — D472 Monoclonal gammopathy: Secondary | ICD-10-CM | POA: Diagnosis not present

## 2022-02-24 DIAGNOSIS — E782 Mixed hyperlipidemia: Secondary | ICD-10-CM

## 2022-02-24 DIAGNOSIS — Z8673 Personal history of transient ischemic attack (TIA), and cerebral infarction without residual deficits: Secondary | ICD-10-CM | POA: Insufficient documentation

## 2022-02-24 DIAGNOSIS — R519 Headache, unspecified: Secondary | ICD-10-CM | POA: Diagnosis not present

## 2022-02-24 DIAGNOSIS — Z7902 Long term (current) use of antithrombotics/antiplatelets: Secondary | ICD-10-CM | POA: Insufficient documentation

## 2022-02-24 DIAGNOSIS — Z79890 Hormone replacement therapy (postmenopausal): Secondary | ICD-10-CM | POA: Diagnosis not present

## 2022-02-24 DIAGNOSIS — M791 Myalgia, unspecified site: Secondary | ICD-10-CM | POA: Insufficient documentation

## 2022-02-24 DIAGNOSIS — Z79899 Other long term (current) drug therapy: Secondary | ICD-10-CM | POA: Insufficient documentation

## 2022-02-24 DIAGNOSIS — R531 Weakness: Secondary | ICD-10-CM | POA: Insufficient documentation

## 2022-02-24 DIAGNOSIS — R410 Disorientation, unspecified: Secondary | ICD-10-CM

## 2022-02-24 DIAGNOSIS — G629 Polyneuropathy, unspecified: Secondary | ICD-10-CM | POA: Diagnosis not present

## 2022-02-24 LAB — CBC WITH DIFFERENTIAL (CANCER CENTER ONLY)
Abs Immature Granulocytes: 0.01 10*3/uL (ref 0.00–0.07)
Basophils Absolute: 0 10*3/uL (ref 0.0–0.1)
Basophils Relative: 0 %
Eosinophils Absolute: 0 10*3/uL (ref 0.0–0.5)
Eosinophils Relative: 1 %
HCT: 41.5 % (ref 36.0–46.0)
Hemoglobin: 14.1 g/dL (ref 12.0–15.0)
Immature Granulocytes: 0 %
Lymphocytes Relative: 29 %
Lymphs Abs: 1.3 10*3/uL (ref 0.7–4.0)
MCH: 31.5 pg (ref 26.0–34.0)
MCHC: 34 g/dL (ref 30.0–36.0)
MCV: 92.8 fL (ref 80.0–100.0)
Monocytes Absolute: 0.5 10*3/uL (ref 0.1–1.0)
Monocytes Relative: 11 %
Neutro Abs: 2.6 10*3/uL (ref 1.7–7.7)
Neutrophils Relative %: 59 %
Platelet Count: 278 10*3/uL (ref 150–400)
RBC: 4.47 MIL/uL (ref 3.87–5.11)
RDW: 13.9 % (ref 11.5–15.5)
WBC Count: 4.3 10*3/uL (ref 4.0–10.5)
nRBC: 0 % (ref 0.0–0.2)

## 2022-02-24 LAB — CMP (CANCER CENTER ONLY)
ALT: 18 U/L (ref 0–44)
AST: 24 U/L (ref 15–41)
Albumin: 4.7 g/dL (ref 3.5–5.0)
Alkaline Phosphatase: 70 U/L (ref 38–126)
Anion gap: 7 (ref 5–15)
BUN: 16 mg/dL (ref 8–23)
CO2: 32 mmol/L (ref 22–32)
Calcium: 10.6 mg/dL — ABNORMAL HIGH (ref 8.9–10.3)
Chloride: 93 mmol/L — ABNORMAL LOW (ref 98–111)
Creatinine: 0.81 mg/dL (ref 0.44–1.00)
GFR, Estimated: >60 mL/min (ref >60)
Glucose, Bld: 93 mg/dL (ref 70–99)
Potassium: 3.9 mmol/L (ref 3.5–5.1)
Sodium: 132 mmol/L — ABNORMAL LOW (ref 135–145)
Total Bilirubin: 0.8 mg/dL (ref 0.3–1.2)
Total Protein: 7.6 g/dL (ref 6.5–8.1)

## 2022-02-24 LAB — LIPID PANEL
Cholesterol: 167 mg/dL (ref 0–200)
HDL: 80 mg/dL (ref >40)
LDL Cholesterol: 66 mg/dL (ref 0–99)
Total CHOL/HDL Ratio: 2.1 RATIO
Triglycerides: 105 mg/dL (ref <150)
VLDL: 21 mg/dL (ref 0–40)

## 2022-02-24 LAB — LACTATE DEHYDROGENASE: LDH: 173 U/L (ref 98–192)

## 2022-02-24 MED ORDER — PYRIDOXINE HCL 500 MG PO TABS: 500.0000 mg | ORAL_TABLET | Freq: Every day | ORAL | 5 refills | Status: AC

## 2022-02-24 NOTE — Progress Notes (Signed)
Hematology and Oncology Follow Up Visit  [name] [id number] 01-21-1934 88 y.o. 02/24/2022   Principle Diagnosis:  IgA Kappa MGUS  Current Therapy:   Observation     Interim History:  [name] is back for follow-up.  She seems to be doing pretty well.  She made it through the summer.  The last time that we saw her back in June, she had a fall and had the broken her left shoulder.  This seems to be improving.  I think she is done with her therapy..  She still has some weakness.  She had a stroke in the past.  She has problems with her balance.  I think that she probably does need some physical therapy.  Again, she recently completed this.  Her MGUS studies have looked pretty stable.  Her monoclonal spike is 0.3 g/dL.  Her IgA level 400 mg/dL.  The Kappa light chain 3.4 mg/dL.  She has had no change in bowel or bladder habits.  She has had no cough or shortness of breath.  She has had no bleeding.  Her appetite seems to be doing all right.  She continues on the Plavix for the history of her CVA.  Overall, her performance status is ECOG 2.     Medications:  Current Outpatient Medications:    ALPRAZolam (XANAX) 0.5 MG tablet, Take 0.25 mg by mouth 2 (two) times daily as needed (restless legs)., Disp: , Rfl:    amLODipine (NORVASC) 5 MG tablet, Take 5 mg by mouth daily., Disp: , Rfl:    Ascorbic Acid (VITAMIN C PO), Take 1 tablet by mouth daily., Disp: , Rfl:    Calcium Carbonate-Vitamin D (CALCIUM-D PO), Take 1 tablet by mouth daily., Disp: , Rfl:    celecoxib (CELEBREX) 200 MG capsule, Take 1 tablet by mouth as needed., Disp: , Rfl:    clopidogrel (PLAVIX) 75 MG tablet, Take 1 tablet (75 mg total) by mouth daily., Disp: 30 tablet, Rfl: 3   dexlansoprazole (DEXILANT) 60 MG capsule, Take 60 mg by mouth every morning., Disp: , Rfl:    escitalopram (LEXAPRO) 10 MG tablet, Take 10 mg by mouth at bedtime., Disp: , Rfl:    levothyroxine (SYNTHROID, LEVOTHROID) 25 MCG tablet, Take 25  mcg by mouth daily before breakfast. , Disp: , Rfl:    metoprolol succinate (TOPROL-XL) 25 MG 24 hr tablet, Take 25 mg by mouth in the morning and at bedtime., Disp: , Rfl:    Multiple Vitamin (MULTIVITAMIN WITH MINERALS) TABS tablet, Take 1 tablet by mouth daily., Disp: , Rfl:    NONFORMULARY OR COMPOUNDED ITEM, Apply 1 application  topically See admin instructions. Transdermal therapeutics  meloxicam 0.5%, doxepin 3%, amantadine 3%, dextromethorphan 2%, lidocaine 2%. - compounded at [place]:  Apply topically to legs daily after showering for neuropathy, Disp: , Rfl:    Polyvinyl Alcohol-Povidone (REFRESH OP), Place 1 drop into both eyes daily as needed (dry eyes)., Disp: , Rfl:    rosuvastatin (CRESTOR) 20 MG tablet, Take 1 tablet (20 mg total) by mouth daily., Disp: 30 tablet, Rfl: 0   triamterene-hydrochlorothiazide (MAXZIDE-25) 37.5-25 MG tablet, Take 1 tablet by mouth every morning. Hold this medication for now, please see your pcp for hospital discharge follow up, repeat sodium level, please discussion resumption or discontinuation this meds (Patient taking differently: Take 1 tablet by mouth every morning.), Disp: , Rfl:    Vitamin D, Ergocalciferol, (DRISDOL) 50000 UNITS CAPS, Take 50,000 Units by mouth every Friday., Disp: ,  Rfl:    VITAMIN E PO, Take 1 capsule by mouth daily., Disp: , Rfl:    Wheat Dextrin (BENEFIBER) POWD, Take 1 daily dose, Disp: 500 g, Rfl: 0   zolpidem (AMBIEN CR) 12.5 MG CR tablet, Take 12.5 mg by mouth at bedtime., Disp: , Rfl:    amoxicillin (AMOXIL) 500 MG capsule, Take 2,000 mg by mouth as needed (dental). Take 4 capsules (2000 mg) by mouth one hour prior to dental appointments (Patient not taking: Reported on 02/24/2022), Disp: , Rfl:   Allergies: No Known Allergies  Past Medical History, Surgical history, Social history, and Family History were reviewed and updated.  Review of Systems: Review of Systems  Constitutional: Negative.  Negative for  appetite change, fatigue, fever and unexpected weight change.  HENT:  Negative.  Negative for lump/mass, mouth sores, sore throat and trouble swallowing.   Eyes: Negative.   Respiratory: Negative.  Negative for cough, hemoptysis and shortness of breath.   Cardiovascular: Negative.  Negative for leg swelling and palpitations.  Gastrointestinal: Negative.  Negative for abdominal distention, abdominal pain, blood in stool, constipation, diarrhea, nausea and vomiting.  Endocrine: Negative.   Genitourinary: Negative.  Negative for bladder incontinence, dysuria, frequency and hematuria.   Musculoskeletal:  Positive for myalgias. Negative for arthralgias, back pain and gait problem.  Skin: Negative.  Negative for itching and rash.  Neurological:  Positive for headaches. Negative for dizziness, extremity weakness, gait problem, numbness, seizures and speech difficulty.  Hematological: Negative.  Does not bruise/bleed easily.  Psychiatric/Behavioral: Negative.  Negative for depression and sleep disturbance. The patient is not nervous/anxious.     Physical Exam:  height is '5\' 1"'$  (1.549 m) and weight is 134 lb 1.9 oz (60.8 kg). Her oral temperature is 97.9 F (36.6 C). Her blood pressure is 129/77 and her pulse is 60. Her respiration is 18 and oxygen saturation is 100%.   Wt Readings from Last 3 Encounters:  02/24/22 134 lb 1.9 oz (60.8 kg)  02/23/22 136 lb (61.7 kg)  01/05/22 133 lb (60.3 kg)    Physical Exam Vitals reviewed.  HENT:     Head: Normocephalic and atraumatic.  Eyes:     Pupils: Pupils are equal, round, and reactive to light.  Cardiovascular:     Rate and Rhythm: Normal rate and regular rhythm.     Heart sounds: Normal heart sounds.  Pulmonary:     Effort: Pulmonary effort is normal.     Breath sounds: Normal breath sounds.  Abdominal:     General: Bowel sounds are normal.     Palpations: Abdomen is soft.  Musculoskeletal:        General: No tenderness or deformity. Normal  range of motion.     Cervical back: Normal range of motion.  Lymphadenopathy:     Cervical: No cervical adenopathy.  Skin:    General: Skin is warm and dry.     Findings: No erythema or rash.  Neurological:     Mental Status: She is alert and oriented to person, place, and time.  Psychiatric:        Behavior: Behavior normal.        Thought Content: Thought content normal.        Judgment: Judgment normal.      Lab Results  Component Value Date   WBC 4.3 02/24/2022   HGB 14.1 02/24/2022   HCT 41.5 02/24/2022   MCV 92.8 02/24/2022   PLT 278 02/24/2022     Chemistry  Component Value Date/Time   NA 132 (L) 02/24/2022 1135   NA 133 (L) 07/06/2021 1528   K 3.9 02/24/2022 1135   CL 93 (L) 02/24/2022 1135   CO2 32 02/24/2022 1135   BUN 16 02/24/2022 1135   BUN 14 07/06/2021 1528   CREATININE 0.81 02/24/2022 1135      Component Value Date/Time   CALCIUM 10.6 (H) 02/24/2022 1135   ALKPHOS 70 02/24/2022 1135   AST 24 02/24/2022 1135   ALT 18 02/24/2022 1135   BILITOT 0.8 02/24/2022 1135      Impression and Plan: Ms. Cirrincione is a 88 year old white female.  She has an IgA kappa MGUS.  This is still at a very low level.  This certainly could be from her age alone.  I told her to try some vitamin B6 for the neuropathy that she has.  Again I do not think the neuropathy is anything related to the MGUS.  I just hope her quality of life improves.  I know we have the holiday season coming up.  I forgot to mention that she does have a cardiac loop and to help with respect to monitoring her heart rhythm.  We will try to get her back after the holiday season.  I think this would be reasonable.    [name], MD 9/28/202312:38 PM

## 2022-02-24 NOTE — Progress Notes (Signed)
[facility] Summary Report / Loop Recorder 

## 2022-02-24 NOTE — Telephone Encounter (Signed)
Per 02/24/22 los - gave upcoming appointments - confirmmed

## 2022-02-25 LAB — KAPPA/LAMBDA LIGHT CHAINS
Kappa free light chain: 32.1 mg/L — ABNORMAL HIGH (ref 3.3–19.4)
Kappa, lambda light chain ratio: 1.76 — ABNORMAL HIGH (ref 0.26–1.65)
Lambda free light chains: 18.2 mg/L (ref 5.7–26.3)

## 2022-02-26 LAB — IGG, IGA, IGM
IgA: 409 mg/dL (ref 64–422)
IgG (Immunoglobin G), Serum: 911 mg/dL (ref 586–1602)
IgM (Immunoglobulin M), Srm: 92 mg/dL (ref 26–217)

## 2022-03-02 LAB — IMMUNOFIXATION REFLEX, SERUM
IgA: 417 mg/dL (ref 64–422)
IgG (Immunoglobin G), Serum: 888 mg/dL (ref 586–1602)
IgM (Immunoglobulin M), Srm: 88 mg/dL (ref 26–217)

## 2022-03-02 LAB — PROTEIN ELECTROPHORESIS, SERUM, WITH REFLEX
A/G Ratio: 1.4 (ref 0.7–1.7)
Albumin ELP: 4.1 g/dL (ref 2.9–4.4)
Alpha-1-Globulin: 0.3 g/dL (ref 0.0–0.4)
Alpha-2-Globulin: 0.7 g/dL (ref 0.4–1.0)
Beta Globulin: 1 g/dL (ref 0.7–1.3)
Gamma Globulin: 1 g/dL (ref 0.4–1.8)
Globulin, Total: 3 g/dL (ref 2.2–3.9)
M-Spike, %: 0.3 g/dL — ABNORMAL HIGH
SPEP Interpretation: 0
Total Protein ELP: 7.1 g/dL (ref 6.0–8.5)

## 2022-03-10 LAB — CUP PACEART REMOTE DEVICE CHECK
Date Time Interrogation Session: 20231011092814
Implantable Pulse Generator Implant Date: 20230407

## 2022-03-14 ENCOUNTER — Ambulatory Visit (INDEPENDENT_AMBULATORY_CARE_PROVIDER_SITE_OTHER): Payer: Medicare Other

## 2022-03-14 DIAGNOSIS — I639 Cerebral infarction, unspecified: Secondary | ICD-10-CM

## 2022-03-17 NOTE — Progress Notes (Signed)
[facility] Summary Report / Loop Recorder 

## 2022-04-18 ENCOUNTER — Ambulatory Visit (INDEPENDENT_AMBULATORY_CARE_PROVIDER_SITE_OTHER): Payer: Medicare Other

## 2022-04-18 DIAGNOSIS — I639 Cerebral infarction, unspecified: Secondary | ICD-10-CM | POA: Diagnosis not present

## 2022-04-19 LAB — CUP PACEART REMOTE DEVICE CHECK
Date Time Interrogation Session: 20231119231703
Implantable Pulse Generator Implant Date: 20230407

## 2022-05-02 DIAGNOSIS — E039 Hypothyroidism, unspecified: Secondary | ICD-10-CM | POA: Diagnosis not present

## 2022-05-02 DIAGNOSIS — I10 Essential (primary) hypertension: Secondary | ICD-10-CM | POA: Diagnosis not present

## 2022-05-02 DIAGNOSIS — R7989 Other specified abnormal findings of blood chemistry: Secondary | ICD-10-CM | POA: Diagnosis not present

## 2022-05-02 DIAGNOSIS — M810 Age-related osteoporosis without current pathological fracture: Secondary | ICD-10-CM | POA: Diagnosis not present

## 2022-05-02 DIAGNOSIS — E785 Hyperlipidemia, unspecified: Secondary | ICD-10-CM | POA: Diagnosis not present

## 2022-05-09 DIAGNOSIS — R82998 Other abnormal findings in urine: Secondary | ICD-10-CM | POA: Diagnosis not present

## 2022-05-09 DIAGNOSIS — E663 Overweight: Secondary | ICD-10-CM | POA: Diagnosis not present

## 2022-05-09 DIAGNOSIS — K219 Gastro-esophageal reflux disease without esophagitis: Secondary | ICD-10-CM | POA: Diagnosis not present

## 2022-05-09 DIAGNOSIS — Z1331 Encounter for screening for depression: Secondary | ICD-10-CM | POA: Diagnosis not present

## 2022-05-09 DIAGNOSIS — Z0000 Encounter for general adult medical examination without abnormal findings: Secondary | ICD-10-CM | POA: Diagnosis not present

## 2022-05-09 DIAGNOSIS — M1990 Unspecified osteoarthritis, unspecified site: Secondary | ICD-10-CM | POA: Diagnosis not present

## 2022-05-09 DIAGNOSIS — I700 Atherosclerosis of aorta: Secondary | ICD-10-CM | POA: Diagnosis not present

## 2022-05-09 DIAGNOSIS — E039 Hypothyroidism, unspecified: Secondary | ICD-10-CM | POA: Diagnosis not present

## 2022-05-09 DIAGNOSIS — E785 Hyperlipidemia, unspecified: Secondary | ICD-10-CM | POA: Diagnosis not present

## 2022-05-09 DIAGNOSIS — I10 Essential (primary) hypertension: Secondary | ICD-10-CM | POA: Diagnosis not present

## 2022-05-09 DIAGNOSIS — Z1339 Encounter for screening examination for other mental health and behavioral disorders: Secondary | ICD-10-CM | POA: Diagnosis not present

## 2022-05-19 ENCOUNTER — Telehealth: Payer: Self-pay | Admitting: Cardiology

## 2022-05-19 NOTE — Telephone Encounter (Signed)
Pt would like a call back to discuss loop recorder results. Please advise.

## 2022-05-20 NOTE — Telephone Encounter (Signed)
Returned call to Pt.   She was concerned because she has not received any phone calls related to her loop recorder.  Advised that no news is good news, and no atrial fibrillation has been noted on her loop yet.  All questions answered.  Pt appreciative of information.

## 2022-05-24 ENCOUNTER — Ambulatory Visit (INDEPENDENT_AMBULATORY_CARE_PROVIDER_SITE_OTHER): Payer: Medicare Other

## 2022-05-24 DIAGNOSIS — I639 Cerebral infarction, unspecified: Secondary | ICD-10-CM

## 2022-05-24 LAB — CUP PACEART REMOTE DEVICE CHECK
Date Time Interrogation Session: 20231225231219
Implantable Pulse Generator Implant Date: 20230407

## 2022-05-26 NOTE — Progress Notes (Signed)
[facility] Summary Report / Loop Recorder 

## 2022-06-03 DIAGNOSIS — M5012 Mid-cervical disc disorder, unspecified level: Secondary | ICD-10-CM | POA: Diagnosis not present

## 2022-06-03 DIAGNOSIS — S22000S Wedge compression fracture of unspecified thoracic vertebra, sequela: Secondary | ICD-10-CM | POA: Diagnosis not present

## 2022-06-03 DIAGNOSIS — Z6826 Body mass index (BMI) 26.0-26.9, adult: Secondary | ICD-10-CM | POA: Diagnosis not present

## 2022-06-03 DIAGNOSIS — M5416 Radiculopathy, lumbar region: Secondary | ICD-10-CM | POA: Diagnosis not present

## 2022-06-14 IMAGING — MR MR LUMBAR SPINE WO/W CM
7 of 10 series · 31 of 48 positions shown · IV contrast (gadavist)
Comparison: 03/10/2021 lumbar spine MRI, correlation is made with
same day CT lumbar spine.

CLINICAL DATA: Lumbar radiculopathy, prior surgery

EXAM:
MRI LUMBAR SPINE WITHOUT AND WITH CONTRAST
TECHNIQUE: Multiplanar and multiecho pulse sequences of the lumbar spine were
obtained without and with intravenous contrast.
CONTRAST:  6.5mL GADAVIST GADOBUTROL 1 MMOL/ML IV SOLN

[Series 5: T1 · sagittal · 4.0mm · 0.86mm/px · 3 of 17 slices shown]
[im 1/17]
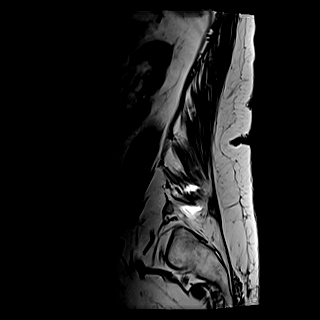
[im 9/17]
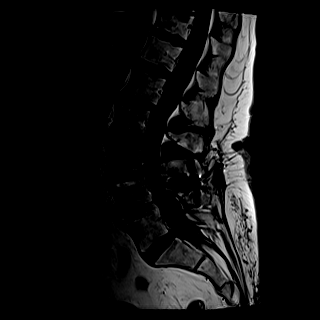
[im 17/17]
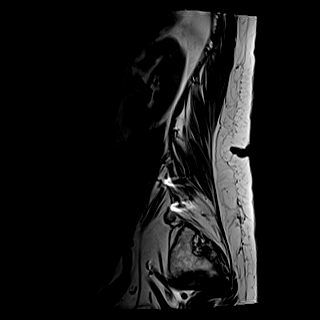

[Series 6: STIR · sagittal · 4.0mm · 0.51mm/px · 4 of 17 slices shown]
[im 1/17]
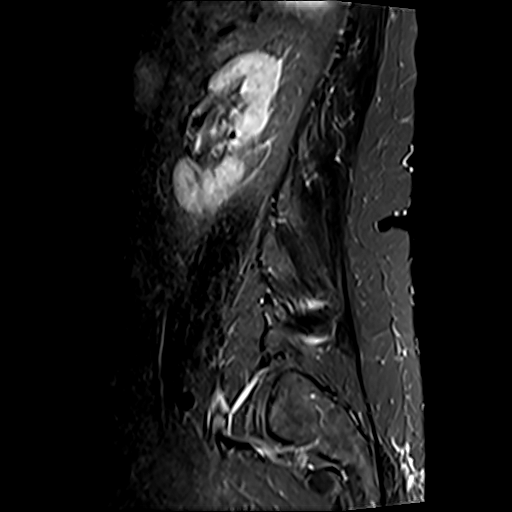
[im 6/17]
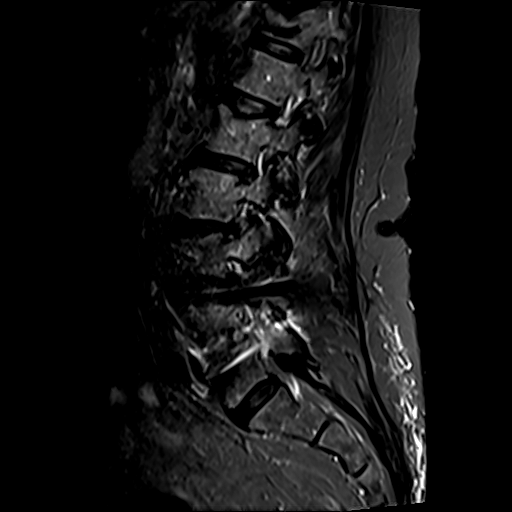
[im 11/17]
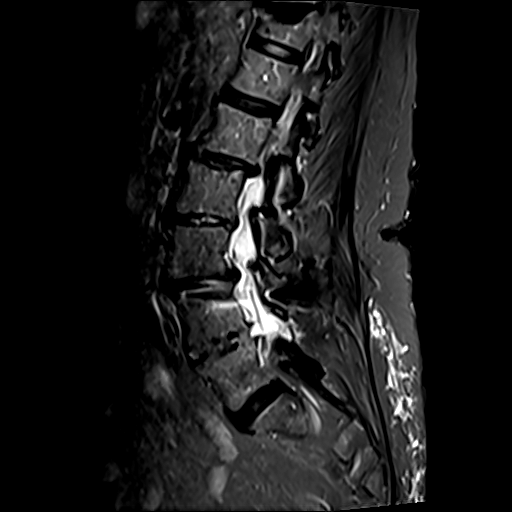
[im 17/17]
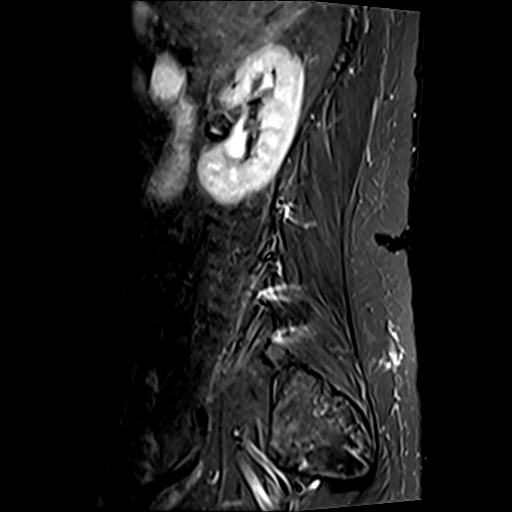

[Series 7: axial t2_upper · axial · 4.0mm · 0.62mm/px · z∈[+99,+201]mm · 5 of 20 slices shown]
[im 1/20]
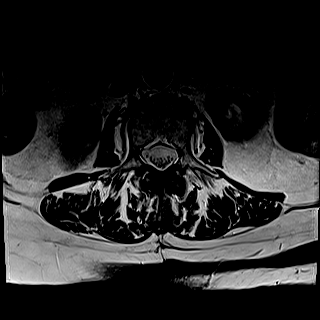
[im 5/20]
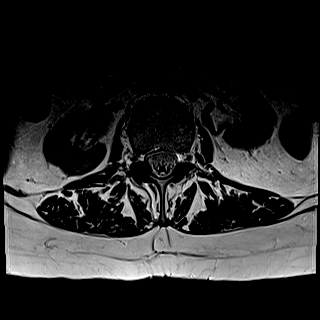
[im 10/20]
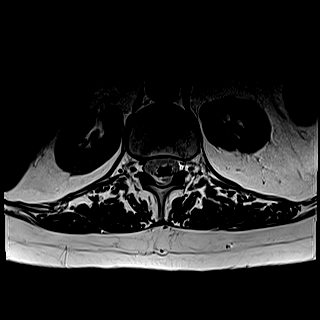
[im 15/20]
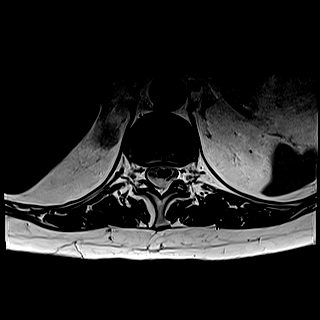
[im 20/20]
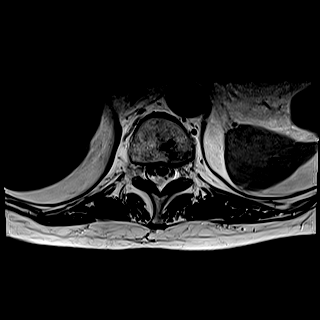

[Series 8: axial t2_lower · axial · 4.0mm · 0.62mm/px · z∈[-75,+49]mm · 6 of 24 slices shown]
[im 1/24]
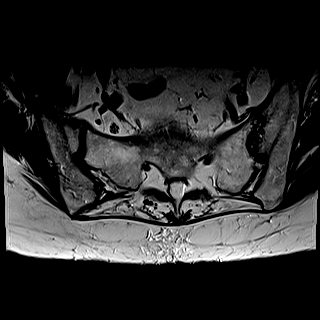
[im 5/24]
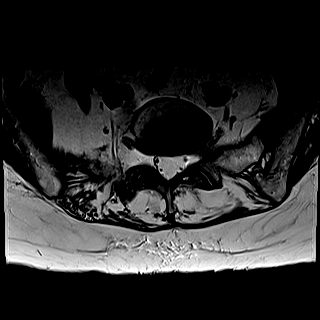
[im 10/24]
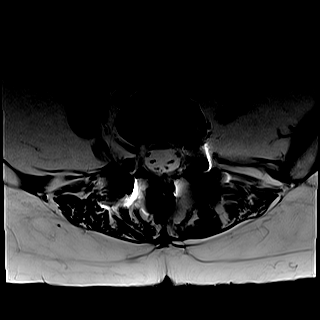
[im 14/24]
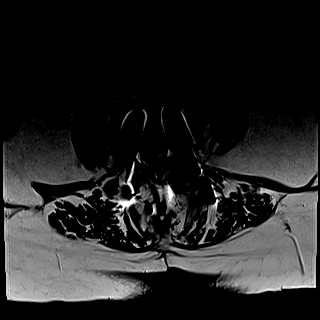
[im 19/24]
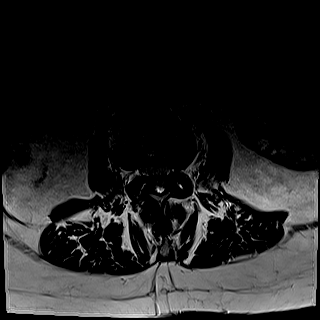
[im 24/24]
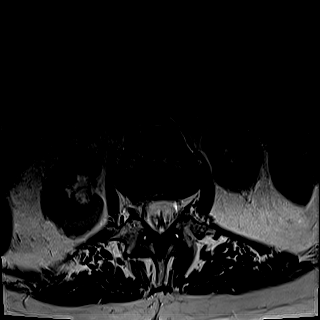

[Series 9: axial t1_upper · axial · 4.0mm · 0.39mm/px · z∈[+99,+201]mm · 5 of 20 slices shown]
[im 1/20]
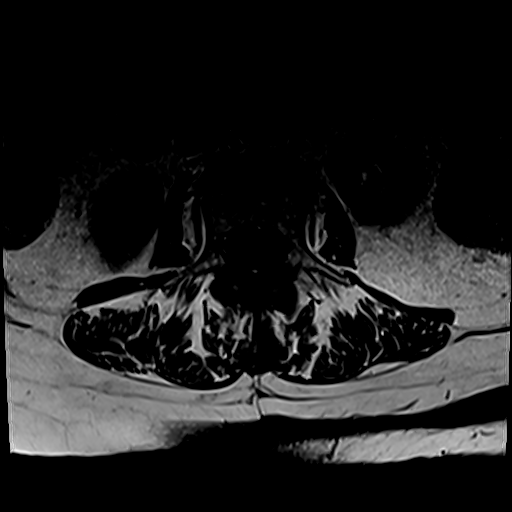
[im 5/20]
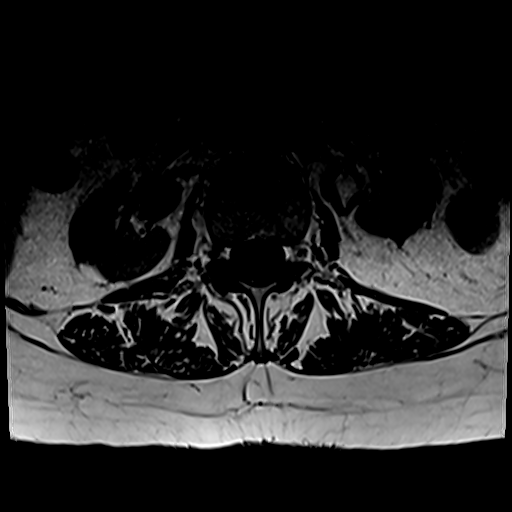
[im 10/20]
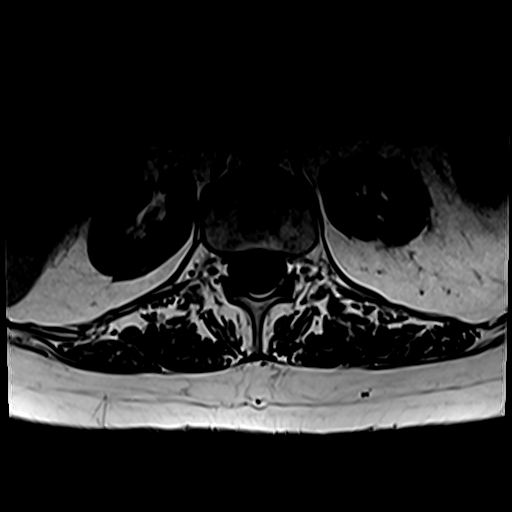
[im 15/20]
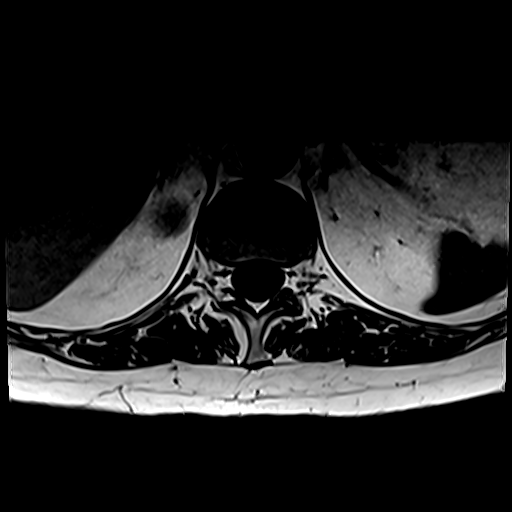
[im 20/20]
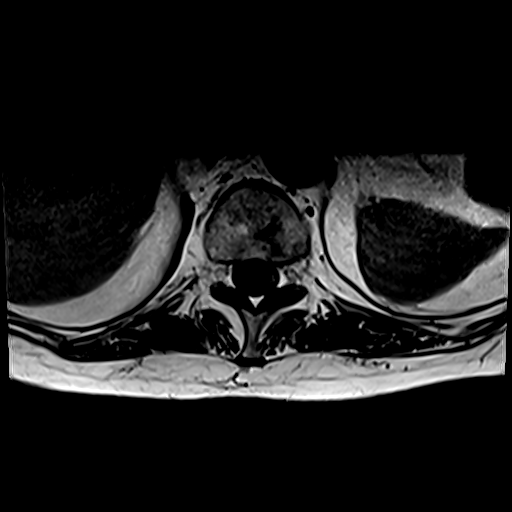

[Series 11: T2 post-contrast · sagittal · 4.0mm · 0.81mm/px · 4 of 17 slices shown]
[im 1/17]
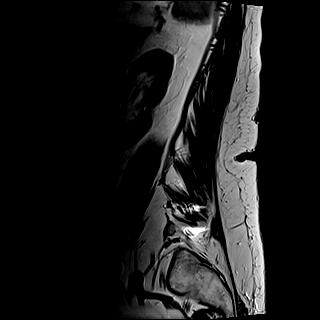
[im 6/17]
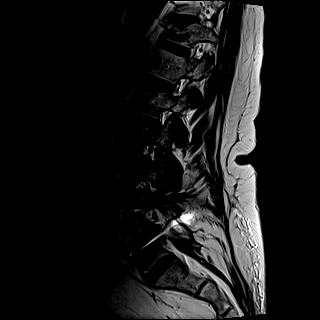
[im 11/17]
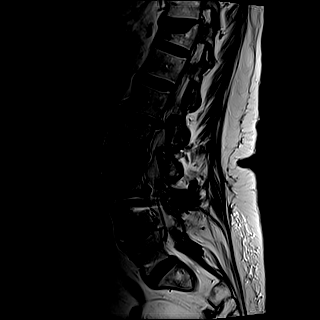
[im 17/17]
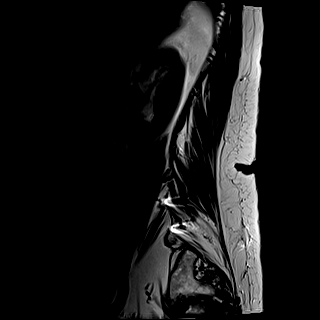

[Series 12: T1 fat-sat post-contrast · sagittal · 4.0mm · 0.81mm/px · 4 of 17 slices shown]
[im 1/17]
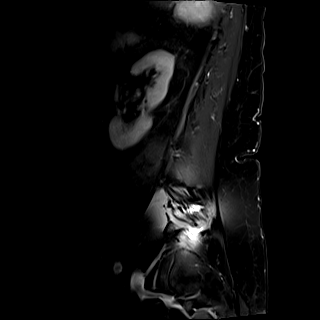
[im 6/17]
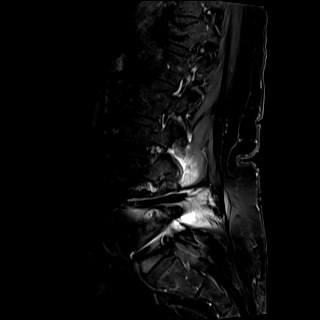
[im 11/17]
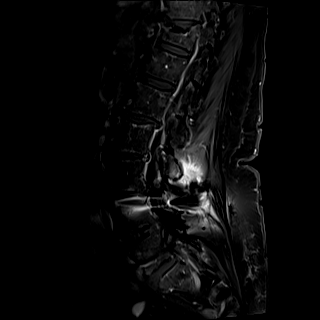
[im 17/17]
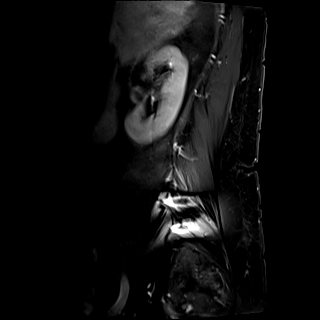

[31 of 48 positions shown; findings below may reference images not displayed]

FINDINGS: Segmentation:  Standard.

Alignment: Dextrocurvature of the lumbar spine. Unchanged mild

Vertebrae: No acute fracture or suspicious osseous lesion. Status
post L4-L5 posterior fusion and decompression. No abnormal
enhancement.

Conus medullaris and cauda equina: Conus extends to the L1-L2 level.
Conus and cauda equina appear normal. No abnormal enhancement.

Paraspinal and other soft tissues: Postoperative changes in the soft
tissues posterior to the lower lumbar spine. Otherwise negative.

Disc levels:

T12-L1: No significant disc bulge. No spinal canal stenosis or
neural foraminal narrowing.

L1-L2: Mild retrolisthesis with broad-based disc bulge. Moderate
facet arthropathy. Ligamentum flavum hypertrophy. Mild narrowing of
the lateral recesses. Minimal spinal canal stenosis, unchanged. Mild
right neural foraminal narrowing, unchanged.

L2-L3: Disc height loss with left eccentric disc bulge, with left
extreme lateral disc protrusion. Moderate facet arthropathy and
ligamentum flavum hypertrophy. Mild narrowing of the left lateral
recess, which could affect the descending left L3 nerve. No spinal
canal stenosis. No neural foraminal narrowing.

L3-L4: Status post fusion of the spinous processes. Unchanged grade
1 anterolisthesis with disc unroofing and moderate disc bulge.
Moderate to severe facet arthropathy, left greater than right.
Ligamentum flavum hypertrophy. Mild spinal canal stenosis.
Effacement of the left lateral recess. Mild bilateral neural
foraminal narrowing, unchanged.

L4-L5: Status post decompression and fusion. No spinal canal
stenosis or neural foraminal narrowing.

L5-S1: Minimal disc bulge. Severe right and mild left facet
arthropathy. No spinal canal stenosis or neural foraminal narrowing.
IMPRESSION: 1. L3-L4 mild spinal canal stenosis, with effacement of the left
lateral recess that likely compresses the descending left L4 nerve.
In addition there is mild bilateral neural foraminal narrowing.
Overall this level appears unchanged compared to 03/10/2021.
2. Other levels are also unchanged compared to 03/10/2021. No
abnormal enhancement.

## 2022-06-14 IMAGING — DX DG KNEE COMPLETE 4+V*L*
4 series · 4 of 4 positions shown · non-contrast
Comparison: None.

CLINICAL DATA: Trauma, fall, pain

EXAM:
LEFT KNEE - COMPLETE 4+ VIEW

[knee ap]
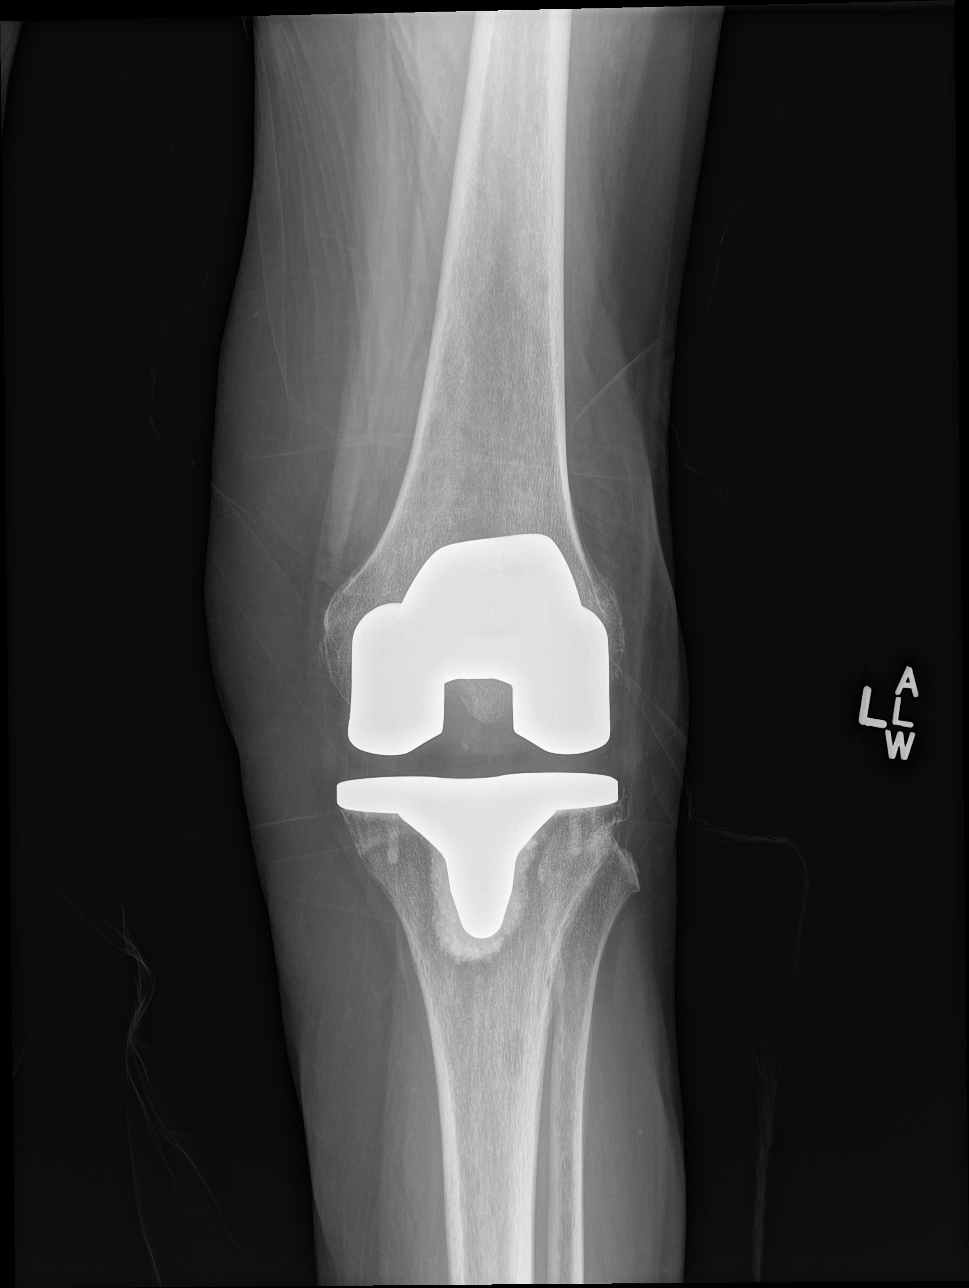

[knee obl (1 of 2)]
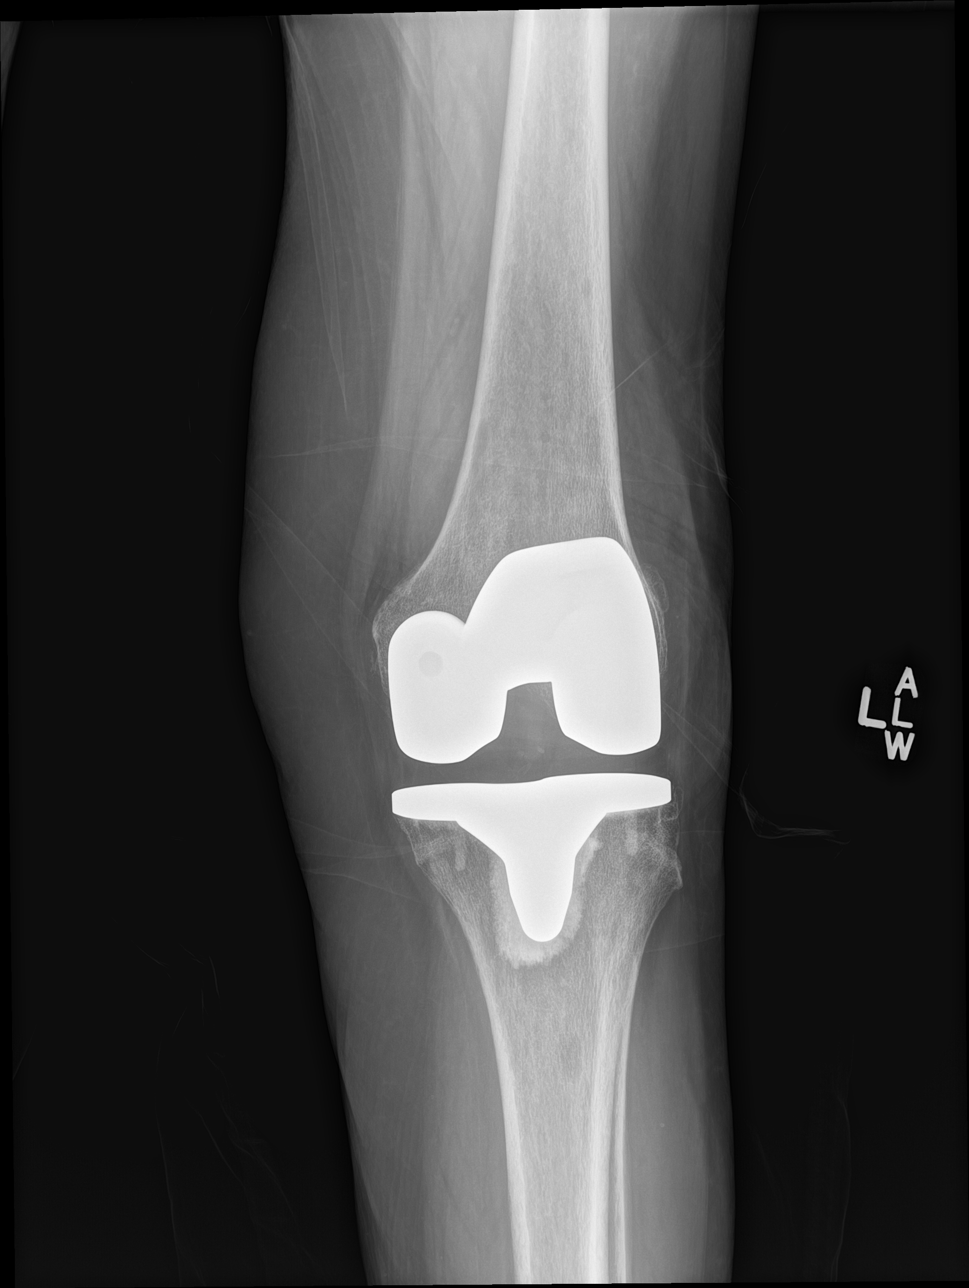

[knee obl (2 of 2)]
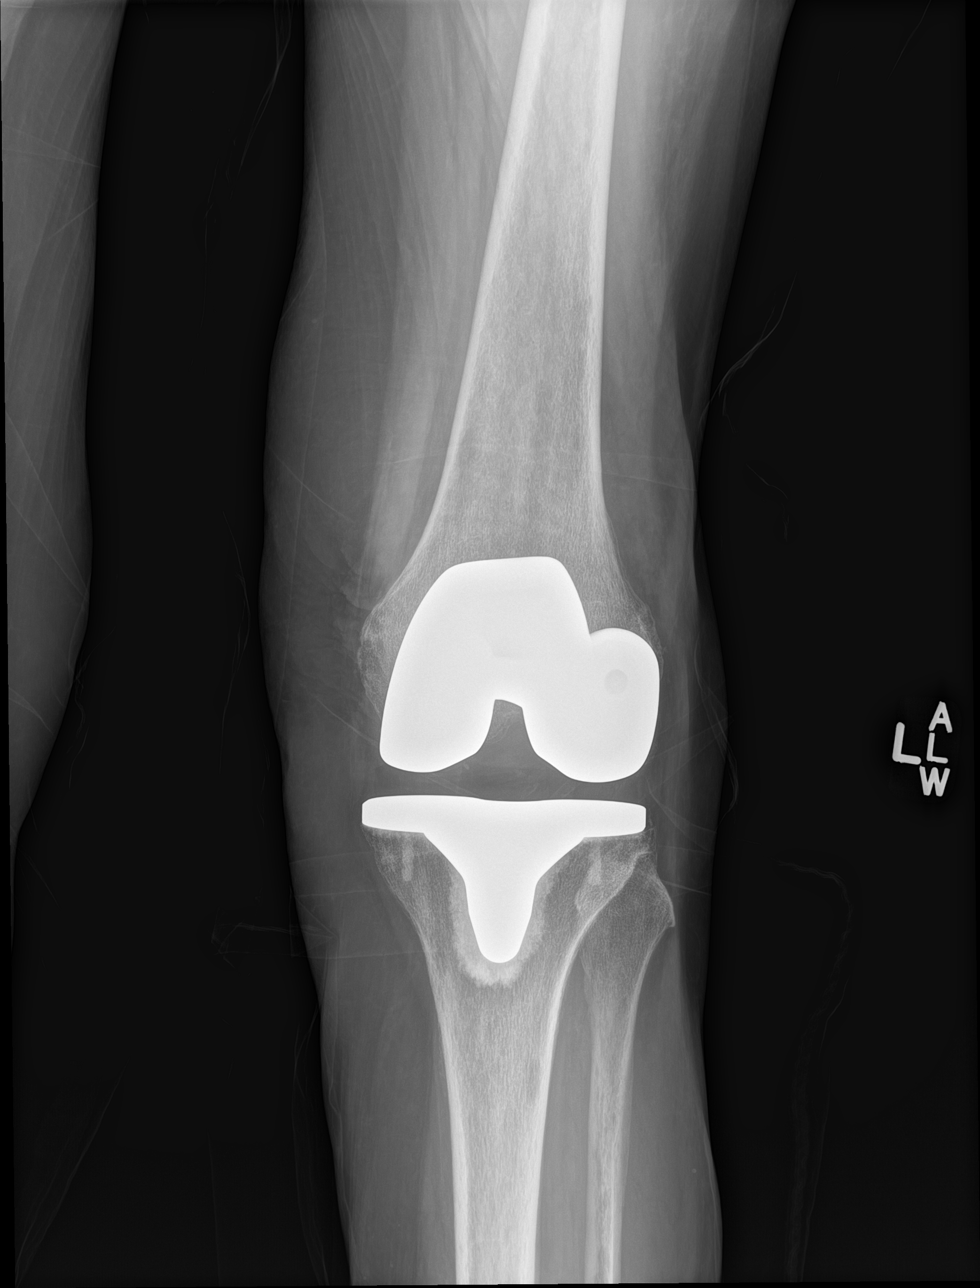

[knee lat]
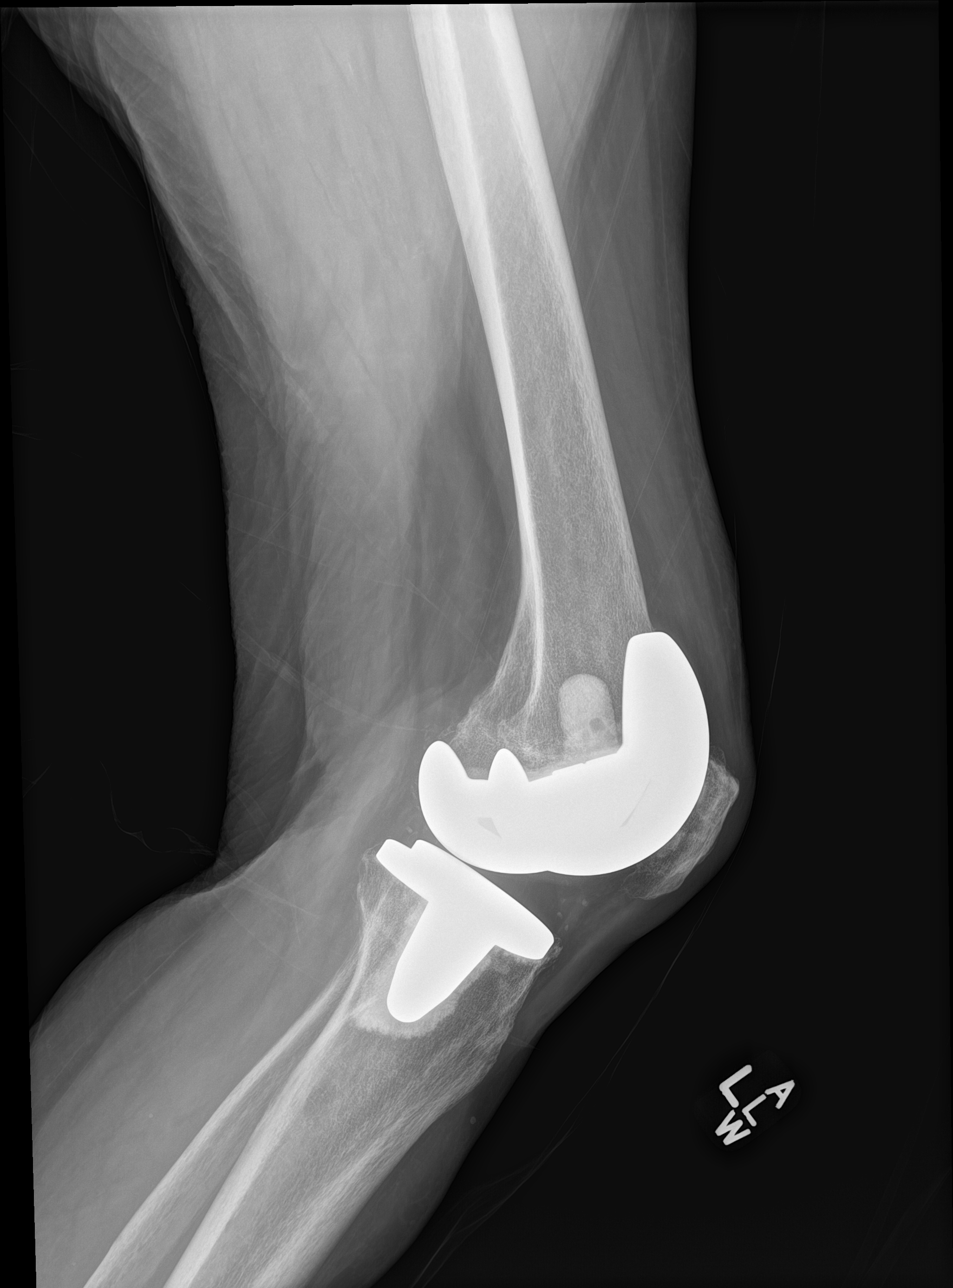

[4 of 4 positions shown; findings below may reference images not displayed]

FINDINGS: No recent fracture or dislocation is seen. There is previous left
knee arthroplasty. There is no significant effusion in the
suprapatellar bursa.
IMPRESSION: Previous left knee arthroplasty. No recent fracture or dislocation
is seen.

## 2022-06-20 NOTE — Progress Notes (Signed)
[facility] Summary Report / Loop Recorder

## 2022-06-22 ENCOUNTER — Encounter: Payer: Self-pay | Admitting: Hematology & Oncology

## 2022-06-22 ENCOUNTER — Other Ambulatory Visit: Payer: Self-pay

## 2022-06-22 ENCOUNTER — Inpatient Hospital Stay: Payer: Medicare Other | Attending: Hematology & Oncology

## 2022-06-22 ENCOUNTER — Inpatient Hospital Stay (HOSPITAL_BASED_OUTPATIENT_CLINIC_OR_DEPARTMENT_OTHER): Payer: Medicare Other | Admitting: Hematology & Oncology

## 2022-06-22 VITALS — BP 126/51 | HR 71 | Temp 98.7°F | Resp 19 | Wt 137.0 lb

## 2022-06-22 DIAGNOSIS — G629 Polyneuropathy, unspecified: Secondary | ICD-10-CM | POA: Diagnosis not present

## 2022-06-22 DIAGNOSIS — Z79899 Other long term (current) drug therapy: Secondary | ICD-10-CM | POA: Diagnosis not present

## 2022-06-22 DIAGNOSIS — M791 Myalgia, unspecified site: Secondary | ICD-10-CM | POA: Insufficient documentation

## 2022-06-22 DIAGNOSIS — Z8673 Personal history of transient ischemic attack (TIA), and cerebral infarction without residual deficits: Secondary | ICD-10-CM | POA: Diagnosis not present

## 2022-06-22 DIAGNOSIS — D472 Monoclonal gammopathy: Secondary | ICD-10-CM | POA: Insufficient documentation

## 2022-06-22 DIAGNOSIS — R519 Headache, unspecified: Secondary | ICD-10-CM | POA: Insufficient documentation

## 2022-06-22 DIAGNOSIS — Z7902 Long term (current) use of antithrombotics/antiplatelets: Secondary | ICD-10-CM | POA: Diagnosis not present

## 2022-06-22 LAB — CBC WITH DIFFERENTIAL (CANCER CENTER ONLY)
Abs Immature Granulocytes: 0 10*3/uL (ref 0.00–0.07)
Basophils Absolute: 0 10*3/uL (ref 0.0–0.1)
Basophils Relative: 0 %
Eosinophils Absolute: 0.1 10*3/uL (ref 0.0–0.5)
Eosinophils Relative: 1 %
HCT: 40.5 % (ref 36.0–46.0)
Hemoglobin: 13.9 g/dL (ref 12.0–15.0)
Immature Granulocytes: 0 %
Lymphocytes Relative: 32 %
Lymphs Abs: 1.5 10*3/uL (ref 0.7–4.0)
MCH: 32.3 pg (ref 26.0–34.0)
MCHC: 34.3 g/dL (ref 30.0–36.0)
MCV: 94 fL (ref 80.0–100.0)
Monocytes Absolute: 0.5 10*3/uL (ref 0.1–1.0)
Monocytes Relative: 11 %
Neutro Abs: 2.5 10*3/uL (ref 1.7–7.7)
Neutrophils Relative %: 56 %
Platelet Count: 251 10*3/uL (ref 150–400)
RBC: 4.31 MIL/uL (ref 3.87–5.11)
RDW: 12.1 % (ref 11.5–15.5)
WBC Count: 4.6 10*3/uL (ref 4.0–10.5)
nRBC: 0 % (ref 0.0–0.2)

## 2022-06-22 LAB — LACTATE DEHYDROGENASE: LDH: 165 U/L (ref 98–192)

## 2022-06-22 LAB — CMP (CANCER CENTER ONLY)
ALT: 17 U/L (ref 0–44)
AST: 24 U/L (ref 15–41)
Albumin: 4.6 g/dL (ref 3.5–5.0)
Alkaline Phosphatase: 60 U/L (ref 38–126)
Anion gap: 10 (ref 5–15)
BUN: 16 mg/dL (ref 8–23)
CO2: 28 mmol/L (ref 22–32)
Calcium: 10.5 mg/dL — ABNORMAL HIGH (ref 8.9–10.3)
Chloride: 96 mmol/L — ABNORMAL LOW (ref 98–111)
Creatinine: 0.84 mg/dL (ref 0.44–1.00)
GFR, Estimated: >60 mL/min (ref >60)
Glucose, Bld: 102 mg/dL — ABNORMAL HIGH (ref 70–99)
Potassium: 4 mmol/L (ref 3.5–5.1)
Sodium: 134 mmol/L — ABNORMAL LOW (ref 135–145)
Total Bilirubin: 0.7 mg/dL (ref 0.3–1.2)
Total Protein: 7.1 g/dL (ref 6.5–8.1)

## 2022-06-22 NOTE — Progress Notes (Signed)
Hematology and Oncology Follow Up Visit  NIANG MITCHELTREE [id number] 03/13/1934 89 y.o. 06/22/2022   Principle Diagnosis:  IgA Kappa MGUS  Current Therapy:   Observation     Interim History:  Ms. Labuda is back for follow-up.  We last saw her back in September.  Since then, she has been doing fairly well.  She has recovered from the broken left shoulder.  She seems to be doing much better with this.  She also had a CVA in the past.  This may have been associated with a COVID-vaccine.  She did have a nice holiday season.  She is still being very diligent with avoiding COVID.  When we last checked her monoclonal studies, her M spike was 0.3 g/dL.  Her IgA level was 415 mg/dL.  The Kappa light chain was 3.2 mg/dL.  She has had no problems with bowels or bladder.  She has had no issues with nausea or vomiting.  She has had no fever.  She has had no bleeding.  She has had no headache.  Her appetite has been doing pretty well.  Overall, I would say that her performance status is probably ECOG 1.    Medications:  Current Outpatient Medications:    ALPRAZolam (XANAX) 0.5 MG tablet, Take 0.25 mg by mouth 2 (two) times daily as needed (restless legs)., Disp: , Rfl:    amLODipine (NORVASC) 5 MG tablet, Take 5 mg by mouth daily., Disp: , Rfl:    amoxicillin (AMOXIL) 500 MG capsule, Take 2,000 mg by mouth as needed (dental). Take 4 capsules (2000 mg) by mouth one hour prior to dental appointments (Patient not taking: Reported on 02/24/2022), Disp: , Rfl:    Ascorbic Acid (VITAMIN C PO), Take 1 tablet by mouth daily., Disp: , Rfl:    Calcium Carbonate-Vitamin D (CALCIUM-D PO), Take 1 tablet by mouth daily., Disp: , Rfl:    celecoxib (CELEBREX) 200 MG capsule, Take 1 tablet by mouth as needed., Disp: , Rfl:    clopidogrel (PLAVIX) 75 MG tablet, Take 1 tablet (75 mg total) by mouth daily., Disp: 30 tablet, Rfl: 3   dexlansoprazole (DEXILANT) 60 MG capsule, Take 60 mg by mouth every morning., Disp: ,  Rfl:    escitalopram (LEXAPRO) 10 MG tablet, Take 10 mg by mouth at bedtime., Disp: , Rfl:    levothyroxine (SYNTHROID, LEVOTHROID) 25 MCG tablet, Take 25 mcg by mouth daily before breakfast. , Disp: , Rfl:    metoprolol succinate (TOPROL-XL) 25 MG 24 hr tablet, Take 25 mg by mouth in the morning and at bedtime., Disp: , Rfl:    Multiple Vitamin (MULTIVITAMIN WITH MINERALS) TABS tablet, Take 1 tablet by mouth daily., Disp: , Rfl:    NONFORMULARY OR COMPOUNDED ITEM, Apply 1 application  topically See admin instructions. Transdermal therapeutics  meloxicam 0.5%, doxepin 3%, amantadine 3%, dextromethorphan 2%, lidocaine 2%. - compounded at [facility]:  Apply topically to legs daily after showering for neuropathy, Disp: , Rfl:    Polyvinyl Alcohol-Povidone (REFRESH OP), Place 1 drop into both eyes daily as needed (dry eyes)., Disp: , Rfl:    pyridoxine (B-6) 500 MG tablet, Take 1 tablet (500 mg total) by mouth daily., Disp: 30 tablet, Rfl: 5   rosuvastatin (CRESTOR) 20 MG tablet, Take 1 tablet (20 mg total) by mouth daily., Disp: 30 tablet, Rfl: 0   triamterene-hydrochlorothiazide (MAXZIDE-25) 37.5-25 MG tablet, Take 1 tablet by mouth every morning. Hold this medication for now, please see your pcp for hospital  discharge follow up, repeat sodium level, please discussion resumption or discontinuation this meds (Patient taking differently: Take 1 tablet by mouth every morning.), Disp: , Rfl:    Vitamin D, Ergocalciferol, (DRISDOL) 50000 UNITS CAPS, Take 50,000 Units by mouth every Friday., Disp: , Rfl:    VITAMIN E PO, Take 1 capsule by mouth daily., Disp: , Rfl:    Wheat Dextrin (BENEFIBER) POWD, Take 1 daily dose, Disp: 500 g, Rfl: 0   zolpidem (AMBIEN CR) 12.5 MG CR tablet, Take 12.5 mg by mouth at bedtime., Disp: , Rfl:   Allergies: No Known Allergies  Past Medical History, Surgical history, Social history, and Family History were reviewed and updated.  Review of Systems: Review of  Systems  Constitutional: Negative.  Negative for appetite change, fatigue, fever and unexpected weight change.  HENT:  Negative.  Negative for lump/mass, mouth sores, sore throat and trouble swallowing.   Eyes: Negative.   Respiratory: Negative.  Negative for cough, hemoptysis and shortness of breath.   Cardiovascular: Negative.  Negative for leg swelling and palpitations.  Gastrointestinal: Negative.  Negative for abdominal distention, abdominal pain, blood in stool, constipation, diarrhea, nausea and vomiting.  Endocrine: Negative.   Genitourinary: Negative.  Negative for bladder incontinence, dysuria, frequency and hematuria.   Musculoskeletal:  Positive for myalgias. Negative for arthralgias, back pain and gait problem.  Skin: Negative.  Negative for itching and rash.  Neurological:  Positive for headaches. Negative for dizziness, extremity weakness, gait problem, numbness, seizures and speech difficulty.  Hematological: Negative.  Does not bruise/bleed easily.  Psychiatric/Behavioral: Negative.  Negative for depression and sleep disturbance. The patient is not nervous/anxious.     Physical Exam:  weight is 137 lb (62.1 kg). Her oral temperature is 98.7 F (37.1 C). Her blood pressure is 126/51 (abnormal) and her pulse is 71. Her respiration is 19 and oxygen saturation is 97%.   Wt Readings from Last 3 Encounters:  06/22/22 137 lb (62.1 kg)  02/24/22 134 lb 1.9 oz (60.8 kg)  02/23/22 136 lb (61.7 kg)    Physical Exam Vitals reviewed.  HENT:     Head: Normocephalic and atraumatic.  Eyes:     Pupils: Pupils are equal, round, and reactive to light.  Cardiovascular:     Rate and Rhythm: Normal rate and regular rhythm.     Heart sounds: Normal heart sounds.  Pulmonary:     Effort: Pulmonary effort is normal.     Breath sounds: Normal breath sounds.  Abdominal:     General: Bowel sounds are normal.     Palpations: Abdomen is soft.  Musculoskeletal:        General: No  tenderness or deformity. Normal range of motion.     Cervical back: Normal range of motion.  Lymphadenopathy:     Cervical: No cervical adenopathy.  Skin:    General: Skin is warm and dry.     Findings: No erythema or rash.  Neurological:     Mental Status: She is alert and oriented to person, place, and time.  Psychiatric:        Behavior: Behavior normal.        Thought Content: Thought content normal.        Judgment: Judgment normal.      Lab Results  Component Value Date   WBC 4.6 06/22/2022   HGB 13.9 06/22/2022   HCT 40.5 06/22/2022   MCV 94.0 06/22/2022   PLT 251 06/22/2022     Chemistry  Component Value Date/Time   NA 132 (L) 02/24/2022 1135   NA 133 (L) 07/06/2021 1528   K 3.9 02/24/2022 1135   CL 93 (L) 02/24/2022 1135   CO2 32 02/24/2022 1135   BUN 16 02/24/2022 1135   BUN 14 07/06/2021 1528   CREATININE 0.81 02/24/2022 1135      Component Value Date/Time   CALCIUM 10.6 (H) 02/24/2022 1135   ALKPHOS 70 02/24/2022 1135   AST 24 02/24/2022 1135   ALT 18 02/24/2022 1135   BILITOT 0.8 02/24/2022 1135      Impression and Plan: Ms. Hudman is a 89 year old white female.  She has an IgA kappa MGUS.  This is still at a very low level.  This certainly could be from her age alone.  I think everything still looking quite good for her.  I am glad that she has recovered from this fall that she had with a broken left shoulder.  She does have a history of the I suspect atrial fibrillation.  This seems to be under good control also.  There is neuropathy.  She does take vitamin B6 for this.  Otherwise, I think we will plan to get her back in another 4 months or so.  We will get her through the Winter.     [name], MD 1/24/202412:14 PM

## 2022-06-23 LAB — KAPPA/LAMBDA LIGHT CHAINS
Kappa free light chain: 30.9 mg/L — ABNORMAL HIGH (ref 3.3–19.4)
Kappa, lambda light chain ratio: 1.9 — ABNORMAL HIGH (ref 0.26–1.65)
Lambda free light chains: 16.3 mg/L (ref 5.7–26.3)

## 2022-06-24 LAB — IGG, IGA, IGM
IgA: 362 mg/dL (ref 64–422)
IgG (Immunoglobin G), Serum: 783 mg/dL (ref 586–1602)
IgM (Immunoglobulin M), Srm: 72 mg/dL (ref 26–217)

## 2022-06-27 ENCOUNTER — Ambulatory Visit: Payer: Medicare Other

## 2022-06-27 DIAGNOSIS — I639 Cerebral infarction, unspecified: Secondary | ICD-10-CM

## 2022-06-28 LAB — CUP PACEART REMOTE DEVICE CHECK
Date Time Interrogation Session: 20240127230443
Implantable Pulse Generator Implant Date: 20230407

## 2022-06-30 ENCOUNTER — Telehealth: Payer: Self-pay | Admitting: *Deleted

## 2022-06-30 LAB — IMMUNOFIXATION REFLEX, SERUM
IgA: 399 mg/dL (ref 64–422)
IgG (Immunoglobin G), Serum: 886 mg/dL (ref 586–1602)
IgM (Immunoglobulin M), Srm: 79 mg/dL (ref 26–217)

## 2022-06-30 LAB — PROTEIN ELECTROPHORESIS, SERUM, WITH REFLEX
A/G Ratio: 1.7 (ref 0.7–1.7)
Albumin ELP: 4.3 g/dL (ref 2.9–4.4)
Alpha-1-Globulin: 0.2 g/dL (ref 0.0–0.4)
Alpha-2-Globulin: 0.6 g/dL (ref 0.4–1.0)
Beta Globulin: 0.9 g/dL (ref 0.7–1.3)
Gamma Globulin: 0.8 g/dL (ref 0.4–1.8)
Globulin, Total: 2.6 g/dL (ref 2.2–3.9)
M-Spike, %: 0.2 g/dL — ABNORMAL HIGH
SPEP Interpretation: 0
Total Protein ELP: 6.9 g/dL (ref 6.0–8.5)

## 2022-06-30 NOTE — Telephone Encounter (Signed)
-----  Message from Yvonne Napoleon, MD sent at 06/30/2022 12:35 PM EST ----- Please call let her know that the protein level is a little bit better.  It is now 0.2.  This is nice and low and will never cause her problem.  Thanks.  [name]

## 2022-06-30 NOTE — Telephone Encounter (Signed)
Pt notified per order of Dr. Marin Olp "that the protein level is a little bit better.  It is now 0.2.  This is nice and low and will never cause her problem.  Thanks.  Pete"  Pt is appreciative of call and has no questions or concerns at this time.

## 2022-07-07 DIAGNOSIS — M5116 Intervertebral disc disorders with radiculopathy, lumbar region: Secondary | ICD-10-CM | POA: Diagnosis not present

## 2022-07-07 DIAGNOSIS — M5416 Radiculopathy, lumbar region: Secondary | ICD-10-CM | POA: Diagnosis not present

## 2022-08-01 ENCOUNTER — Ambulatory Visit (INDEPENDENT_AMBULATORY_CARE_PROVIDER_SITE_OTHER): Payer: Medicare Other

## 2022-08-01 DIAGNOSIS — I639 Cerebral infarction, unspecified: Secondary | ICD-10-CM | POA: Diagnosis not present

## 2022-08-02 LAB — CUP PACEART REMOTE DEVICE CHECK
Date Time Interrogation Session: 20240303231605
Implantable Pulse Generator Implant Date: 20230407

## 2022-08-12 NOTE — Progress Notes (Signed)
[facility] Summary Report / Loop Recorder 

## 2022-08-24 ENCOUNTER — Ambulatory Visit (INDEPENDENT_AMBULATORY_CARE_PROVIDER_SITE_OTHER): Payer: Medicare Other | Admitting: Neurology

## 2022-08-24 ENCOUNTER — Telehealth: Payer: Self-pay | Admitting: Interventional Cardiology

## 2022-08-24 ENCOUNTER — Encounter: Payer: Self-pay | Admitting: Neurology

## 2022-08-24 VITALS — BP 138/93 | HR 88 | Ht 61.0 in | Wt 137.0 lb

## 2022-08-24 DIAGNOSIS — G603 Idiopathic progressive neuropathy: Secondary | ICD-10-CM | POA: Diagnosis not present

## 2022-08-24 DIAGNOSIS — D472 Monoclonal gammopathy: Secondary | ICD-10-CM

## 2022-08-24 DIAGNOSIS — Z8673 Personal history of transient ischemic attack (TIA), and cerebral infarction without residual deficits: Secondary | ICD-10-CM

## 2022-08-24 NOTE — Progress Notes (Signed)
Patient: [name] Date of Birth: [birth year]  Reason for Visit: Follow up History from: Patient Primary Neurologist: Willis/Sethi   ASSESSMENT AND PLAN 89 y.o. year old female   62.  Cryptogenic stroke November [id number], multiple embolic right brain and cerebellar infarcts -Vascular risk factors HTN, HLD, CAD, age -Continue Plavix 75 mg daily for secondary stroke prevention -Goals BP less than 130/90 LDL less than 70, A1c less than 7.0 -Had a loop recorder placed April 2023, no AFIB has been detected   2.  History of chronic IgA MGUS peripheral neuropathy 3.  Chronic gait disorder 4.  Chronic back pain, post kyphoplasty March 2023 with Dr. Ellene Route -Not on any medication, has previously tried gabapentin, Lyrica, Cymbalta, Keppra, Effexor, compound cream -Continue to use compound cream from [facility]  -Be careful with ambulation not to fall -Continues to live alone, drive, she is very impressive, follow up in 1 year   HISTORY OF PRESENT ILLNESS: Today 08/24/22 Here today for follow-up.  Reviewed Dr. Marin Olp note 06/22/22 still very low IgA kappa MGUS, could be from age-related. Does report sunburn feeling to feet and ache up to her knees, cannot tolerate traditional medications. Balance is unsteady, no recent falls. Her left shoulder has healed. Has a cane. Still living alone. She is driving. No AFIB from loop recorder. Remains on Plavix. Dr. Ellene Route just gave her ESI lumbar, helped for 3 weeks. Feels doing well at her home, her daughter lives beside her. She has a compounded cream from [facility] she uses.  Update 02/23/22 SS: [name] is here today for follow-up.  Was previously seen by myself and Dr. Jannifer Franklin, last visit was in May 2022 for history of IgA MGUS, peripheral neuropathy, gait disorder.  Follows with oncology for IgA kappa MGUS.  Has not been able to tolerate multiple medications for neuropathy.  Has used topical cream with good benefit.  In November 2023 she presented to the  ER with left lower extremity weakness, MRI showed multiple small acute infarcts in the right greater than left cerebral hemispheres, also involving right cerebellum concerning for embolic process.  CT angiogram of the head and neck showed beaded appearance of both terminal carotids raising concern for fibromuscular dysplasia.  No A-fib was seen on cardiac monitor. On Plavix 75 mg daily.   In March 2023 Dr. Ellene Route performed kyphoplasty for T12 compression fracture with good benefit.  Had a fall 3 months ago fell broke left humerus. Her grandson suddenly passed away over Easter, he was helping to take care of her. She is living alone, she is driving. Since the stroke the left leg is still slightly weak in combination with the neuropathy. Is careful with walking, uses a cane. Home health just finished 1 month ago, she wore a sling. Sees Dr. Marin Olp tomorrow, last saw in June, IgA kappa MGUS is still low level, could be age alone. Not on any medication for neuropathy, her legs burn, the cream doesn't work. Wears support socks.   BP up today here but was 122/72 at gynecologist earlier.   Generally sleeps well, sometimes feet wake her up burning, will use wet cool wash cloth. [name] is delightful.   HISTORY  Copied Dr. Leonie Man 08/18/21 HPI: [name] is a 89 year old pleasant Caucasian lady seen today for office consultation visit for stroke.  History is obtained from the patient and review of electronic medical records and opossum reviewed pertinent available imaging films in PACS.  She  has a past medical history  of Arthritis, Complication of anesthesia, Coronary artery disease, DI (detrusor instability), Fracture of vertebra, GERD (gastroesophageal reflux disease), History of hiatal hernia, Hypertension, Hypothyroidism, Menopausal symptoms, MGUS (monoclonal gammopathy of unknown significance) (12/27/2017), Osteoporosis, Peripheral neuropathy (12/27/2017), Peripheral neuropathy, Pneumonia, Restless leg  syndrome, and Varicose veins.  She presented to [facility] emergency room on 04/19/2022 for evaluation of left lower extremity weakness.  This gradually worsened over the last 2 days.  She was barely able to walk on the day of presentation and unable to lift the leg off the bed when she tried to stand she fell.  MRI scan of the brain was obtained which showed multiple small acute infarcts in right greater than left cerebral hemispheres and also involving right cerebellum and multiple vascular territories concerning for central embolic process.  CT angiogram of the brain and neck showed no large vessel stenosis or occlusion but there was beaded appearance of both terminal carotids raising concern for fibromuscular dysplasia.  Echocardiogram showed ejection fraction of 60 to 65%.  Left atrial size was not dilated.  LDL cholesterol was 63 mg percent and hemoglobin A1c was 5.3.  She subsequently had a 30-day heart monitor done and no paroxysmal A-fib was found.  Patient was discharged home and underwent home physical and occupational therapy for for 3 months and is able to ambulate well now.  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  She can walk short distances unassisted and uses a cane for outdoors and long distances.  The patient did have a fall in her bathtub and fractured some ribs.  She was seen in December 2022 and found to have significant right-sided hemorrhagic pleural effusion and 1.2 L but after radiology.  She was also found to have T12 compression fracture and she underwent acrylic balloon kyphoplasty by Dr. Ellene Route on 08/02/2021 for relief of her back pain which has been refractory to medical therapies.  She states her back pain is much better now and she can ambulate quite well.  She has a longstanding history of peripheral neuropathy with hypersensitivity in the stocking distribution and poor balance.  She has seen Dr. Jannifer Franklin for the same.  She states her neuropathy symptoms are  unchanged.  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  REVIEW OF SYSTEMS: Out of a complete 14 system review of symptoms, the patient complains only of the following symptoms, and all other reviewed systems are negative.  See HPI  ALLERGIES: No Known Allergies  HOME MEDICATIONS: Outpatient Medications Prior to Visit  Medication Sig Dispense Refill   ALPRAZolam (XANAX) 0.5 MG tablet Take 0.25 mg by mouth 2 (two) times daily as needed (restless legs).     amLODipine (NORVASC) 5 MG tablet Take 5 mg by mouth daily.     amoxicillin (AMOXIL) 500 MG capsule Take 2,000 mg by mouth as needed (dental). Take 4 capsules (2000 mg) by mouth one hour prior to dental appointments (Patient not taking: Reported on 02/24/2022)     Ascorbic Acid (VITAMIN C PO) Take 1 tablet by mouth daily.     Calcium Carbonate-Vitamin D (CALCIUM-D PO) Take 1 tablet by mouth daily.     celecoxib (CELEBREX) 200 MG capsule Take 1 tablet by mouth as needed.     clopidogrel (PLAVIX) 75 MG tablet Take 1 tablet (75 mg total) by mouth daily. 30 tablet 3   dexlansoprazole (DEXILANT) 60 MG capsule Take 60 mg by mouth every morning.  escitalopram (LEXAPRO) 10 MG tablet Take 10 mg by mouth at bedtime.     levothyroxine (SYNTHROID, LEVOTHROID) 25 MCG tablet Take 25 mcg by mouth daily before breakfast.      metoprolol succinate (TOPROL-XL) 25 MG 24 hr tablet Take 25 mg by mouth in the morning and at bedtime.     Multiple Vitamin (MULTIVITAMIN WITH MINERALS) TABS tablet Take 1 tablet by mouth daily.     NONFORMULARY OR COMPOUNDED ITEM Apply 1 application  topically See admin instructions. Transdermal therapeutics  meloxicam 0.5%, doxepin 3%, amantadine 3%, dextromethorphan 2%, lidocaine 2%. - compounded at [facility]:  Apply topically to legs daily after showering for neuropathy     Polyvinyl Alcohol-Povidone (REFRESH OP) Place 1 drop into both eyes daily as needed (dry  eyes).     pyridoxine (B-6) 500 MG tablet Take 1 tablet (500 mg total) by mouth daily. 30 tablet 5   rosuvastatin (CRESTOR) 20 MG tablet Take 1 tablet (20 mg total) by mouth daily. 30 tablet 0   triamterene-hydrochlorothiazide (MAXZIDE-25) 37.5-25 MG tablet Take 1 tablet by mouth every morning. Hold this medication for now, please see your pcp for hospital discharge follow up, repeat sodium level, please discussion resumption or discontinuation this meds (Patient taking differently: Take 1 tablet by mouth every morning.)     Vitamin D, Ergocalciferol, (DRISDOL) 50000 UNITS CAPS Take 50,000 Units by mouth every Friday.     VITAMIN E PO Take 1 capsule by mouth daily.     Wheat Dextrin (BENEFIBER) POWD Take 1 daily dose 500 g 0   zolpidem (AMBIEN CR) 12.5 MG CR tablet Take 12.5 mg by mouth at bedtime.     No facility-administered medications prior to visit.    PAST MEDICAL HISTORY: Past Medical History:  Diagnosis Date   Anxiety    Arthritis    Chronic headaches    hx - now just occasional HA per patient   Colon polyps    Complication of anesthesia    Elevated blood pressure after having last Kyphoplasty; pt stated "I was given Morphine and had to stay overnight"   Coronary artery disease    Depression    DI (detrusor instability)    Fracture of vertebra    x 3   GERD (gastroesophageal reflux disease)    History of blood transfusion 2011   w/ knee replacement surgery   History of hiatal hernia    small - does not cause any problems per patient   HLD (hyperlipidemia)    Hypertension    Hypothyroidism    Menopausal symptoms    MGUS (monoclonal gammopathy of unknown significance) 12/27/2017   IgA   Osteoporosis    Peripheral neuropathy 12/27/2017   Pneumonia    Restless leg syndrome    Stroke ([place]) 03/2021   mini strokes   TMJ click    Varicose veins     PAST SURGICAL HISTORY: Past Surgical History:  Procedure Laterality Date   ABDOMINAL HYSTERECTOMY  1992   TAH,BSO    BLADDER SUSPENSION  2011   CRYOMESH   CARPAL TUNNEL RELEASE Bilateral 1982   CHOLECYSTECTOMY  1992   COLONOSCOPY     EYE SURGERY Bilateral    Cataract removal   IR THORACENTESIS ASP PLEURAL SPACE W/IMG GUIDE  05/28/2021   KYPHOPLASTY     X 3   KYPHOPLASTY N/A 08/02/2021   Procedure: Thoracic Twelve KYPHOPLASTY;  Surgeon: Kristeen Miss, MD;  Location: [facility];  Service: Neurosurgery;  Laterality: N/A;  LUMBAR LAMINECTOMY WITH COFLEX 1 LEVEL N/A 12/29/2015   Procedure: L3-4 Laminectomy with Coflex;  Surgeon: Kristeen Miss, MD;  Location: [facility];  Service: Neurosurgery;  Laterality: N/A;  L3-4 Laminectomy with coflex   OOPHORECTOMY     BSO   REPLACEMENT TOTAL KNEE Bilateral 2008,  2011   RIGHT 2008, LEFT 2011   REVERSE SHOULDER ARTHROPLASTY Right 01/30/2020   Procedure: REVERSE SHOULDER ARTHROPLASTY;  Surgeon: Nicholes Stairs, MD;  Location: [facility];  Service: Orthopedics;  Laterality: Right;  2.5 hrs   SPINAL FUSION  2011   VERTEBRAL SURGERY     T-8, T-10. T-12  DR Roselee Culver    FAMILY HISTORY: Family History  Problem Relation Age of Onset   Heart disease Mother    Heart disease Father    Stroke Father    Hypertension Sister    Diabetes Sister    Ovarian cancer Sister    Diabetes Sister    Rectal cancer Sister        ? Colon or rectal   Hyperlipidemia Sister    Hypertension Sister    Hyperlipidemia Sister    Hypertension Sister    Hypertension Brother    Heart disease Brother    Heart disease Brother    Hypertension Brother    Heart disease Brother    Heart disease Brother    Heart disease Brother    Heart disease Brother    Breast cancer Niece    Heart disease Son    Hyperlipidemia Son    Hyperlipidemia Daughter    Hypertension Daughter     SOCIAL HISTORY: Social History   Socioeconomic History   Marital status: Widowed    Spouse name: Not on file   Number of children: 2   Years of education: 39   Highest education level: Not on file  Occupational  History   Occupation: Retired  Tobacco Use   Smoking status: Never   Smokeless tobacco: Never  Vaping Use   Vaping Use: Never used  Substance and Sexual Activity   Alcohol use: No   Drug use: No   Sexual activity: Yes    Birth control/protection: Surgical    Comment: Hysterectomy  Other Topics Concern   Not on file  Social History Narrative   Lives  alone   Caffeine use: decaf only   Right handed    Social Determinants of Health   Financial Resource Strain: Not on file  Food Insecurity: Not on file  Transportation Needs: Not on file  Physical Activity: Not on file  Stress: Not on file  Social Connections: Not on file  Intimate Partner Violence: Not on file   PHYSICAL EXAM  Vitals:   08/24/22 1346  BP: (!) 138/93  Pulse: 88  Weight: 137 lb (62.1 kg)  Height: 5\' 1"  (1.549 m)    Body mass index is 25.89 kg/m.  Generalized: Well developed, in no acute distress, well-appearing, dressed Neurological examination  Mentation: Alert oriented to time, place, history taking. Follows all commands speech and language fluent.  Very pleasant, interactive. Cranial nerve II-XII: Pupils were equal round reactive to light. Extraocular movements were full, visual field were full on confrontational test. Facial sensation and strength were normal. Head turning and shoulder shrug were normal and symmetric. Motor: The motor testing reveals 5 over 5 strength of all 4 extremities. Good symmetric motor tone is noted throughout.  Sensory: Sensory testing is intact to soft touch on all 4 extremities. No evidence of extinction is noted.  Coordination: Cerebellar testing reveals good finger-nose-finger and heel-to-shin bilaterally.  Gait and station: Gait is mildly wide-based, can walk short distances independently, in the hallway uses single-point cane. Reflexes: Deep tendon reflexes are symmetric but depressed  DIAGNOSTIC DATA (LABS, IMAGING, TESTING) - I reviewed patient records, labs,  notes, testing and imaging myself where available.  Lab Results  Component Value Date   WBC 4.6 06/22/2022   HGB 13.9 06/22/2022   HCT 40.5 06/22/2022   MCV 94.0 06/22/2022   PLT 251 06/22/2022      Component Value Date/Time   NA 134 (L) 06/22/2022 1140   NA 133 (L) 07/06/2021 1528   K 4.0 06/22/2022 1140   CL 96 (L) 06/22/2022 1140   CO2 28 06/22/2022 1140   GLUCOSE 102 (H) 06/22/2022 1140   BUN 16 06/22/2022 1140   BUN 14 07/06/2021 1528   CREATININE 0.84 06/22/2022 1140   CALCIUM 10.5 (H) 06/22/2022 1140   PROT 7.1 06/22/2022 1140   PROT 6.6 11/23/2017 0852   ALBUMIN 4.6 06/22/2022 1140   AST 24 06/22/2022 1140   ALT 17 06/22/2022 1140   ALKPHOS 60 06/22/2022 1140   BILITOT 0.7 06/22/2022 1140   GFRNONAA >60 06/22/2022 1140   GFRAA 55 (L) 01/22/2020 1151   GFRAA >60 12/06/2019 0949   Lab Results  Component Value Date   CHOL 167 02/24/2022   HDL 80 02/24/2022   LDLCALC 66 02/24/2022   TRIG 105 02/24/2022   CHOLHDL 2.1 02/24/2022   Lab Results  Component Value Date   HGBA1C 5.3 04/21/2021   Lab Results  Component Value Date   VITAMINB12 2,378 (H) 08/13/2018   Lab Results  Component Value Date   TSH 1.522 04/21/2021    Butler Denmark, AGNP-C, DNP 08/24/2022, 1:56 PM [facility] [address] [place], [place] [address] [phone]

## 2022-08-24 NOTE — Telephone Encounter (Signed)
Patient was previously seen by Dr. Tamala Julian, asking to make appointment with new cardiologist. Scheduled w/ Dr. Ali Lowe per her request.

## 2022-08-24 NOTE — Telephone Encounter (Signed)
Patient calling to speak with dr. Tamala Julian nurse. Want to know which dr she should see. Please advise

## 2022-08-24 NOTE — Patient Instructions (Addendum)
Continue to be careful with walking Goals BP less than 130/90 LDL less than 70, A1c less than 7.0 Stay on Plavix, use your compound  See you back in 1 year

## 2022-09-02 ENCOUNTER — Telehealth: Payer: Self-pay | Admitting: Cardiology

## 2022-09-02 NOTE — Telephone Encounter (Signed)
Loop implanted for Cryptogenic stroke.   No afib noted.  Pt with known PVC's.

## 2022-09-02 NOTE — Telephone Encounter (Signed)
Pt c/o BP issue: STAT if pt c/o blurred vision, one-sided weakness or slurred speech  1. What are your last 5 BP readings?  158/104 - This morning 164/102 - Last night 2. Are you having any other symptoms (ex. Dizziness, headache, blurred vision, passed out)?  Dizziness  3. What is your BP issue?  Pt states that BP has been elevated for the last few days. She would like a callback regarding this matter.   Pt would also like a callback from [facility] regarding Remote Check

## 2022-09-02 NOTE — Telephone Encounter (Signed)
This week hadn't felt good, dizzy so she checked BP. Tells me she started feeling lightheaded last week.     I have scheduled her with Lorin Picket on Monday and gave precautions to call EMS.  She is going to take an extra 1/2 tablet of amlodipine if bp remains elevated.  I asked her to bring her cuff and list of readings with her on Monday.

## 2022-09-04 NOTE — Progress Notes (Unsigned)
Cardiology Office Note:    Date:  09/05/2022  ID:  [name], DOB 1933-09-08, MRN [id number] [facility] Providers Cardiologist:  Lesleigh Noe, MD (Inactive) Electrophysiologist:  Lanier Prude, MD      Patient Profile:   Non-obstructive coronary artery disease  CCTA 10/07/2006: CAC score 1; LAD < 30% soft plaque   Myoview 03/30/2015: EF 83, no ischemia; low risk  CVA in 11/22  TTE 04/21/2021: EF 60-65, no RWMA, Gr 1 DD, normal RVSF, trivial MR, trivial AI S/p ILR (Dr. Lalla Brothers)  PVCs Monitor 05/2021: PVCs 18%; no atrial fibrillation Burden monitored by ILR Hypertension  Hyperlipidemia  GERD  MGUS     History of Present Illness:   [name] is a 89 y.o. female who returns for evaluation of elevated blood pressure.  She was last seen by Dr. Katrinka Blazing 01/05/2022.  She is here alone.  She notes blood pressure elevations over the past several weeks.  She has had headaches as well.  She notes posterior headaches as well as frontal headaches.  She had headaches prior to her stroke as well.  She notes significant fatigue.  She has not had chest pain.  She has chronic shortness of breath with exertion.  This is unchanged.  She has not had orthopnea, leg edema or syncope.  Review of Systems  Constitutional: Negative for fever.  Respiratory:  Negative for cough.   Gastrointestinal:  Negative for diarrhea, hematochezia, melena and vomiting.  Genitourinary:  Negative for hematuria.   see HPI    Studies Reviewed:    EKG: NSR, HR 86, left axis deviation, frequent PVCs, QTc 454   Risk Assessment/Calculations:             Physical Exam:   VS:  BP 130/60   Pulse 86   Ht 5\' 1"  (1.549 m)   Wt 137 lb 6.4 oz (62.3 kg)   SpO2 96%   BMI 25.96 kg/m    Wt Readings from Last 3 Encounters:  09/05/22 137 lb 6.4 oz (62.3 kg)  08/24/22 137 lb (62.1 kg)  06/22/22 137 lb (62.1 kg)    Constitutional:      Appearance: Healthy appearance. Not in distress.  Neck:     Vascular:  JVD normal.  Pulmonary:     Breath sounds: Normal breath sounds. No wheezing. No rales.  Cardiovascular:     Normal rate. Irregular rhythm. Normal S1. Normal S2.      Murmurs: There is no murmur.  Edema:    Peripheral edema absent.  Abdominal:     Palpations: Abdomen is soft.       ASSESSMENT AND PLAN:   Essential hypertension She has had some fluctuations in her blood pressure at home.  I checked her blood pressure with our mercury cuff and it was 130/60 on the right.  With her machine, it was 123/68.  Therefore, I think her machine is fairly accurate.  She has had some fairly elevated blood pressures at home, as high as 160/97.  She has a history of PVCs as well with a burden of 18%.  This is being monitored by her ILR. Increase metoprolol succinate to 25 mg twice daily Continue amlodipine 2.5 mg twice daily, triamterene/HCTZ 37.5/25 mg daily. Keep follow-up in May  PVC's (premature ventricular contractions) Previous monitor with 18% burden.  Her PVC burden has been fairly stable on ILR interrogation.  She notes significant fatigue.  Her last echocardiogram was in 2022.  In light of  her PVCs and previous echocardiogram with mitral regurgitation and aortic insufficiency, I will obtain a follow-up echocardiogram.  I will increase her metoprolol succinate to 25 mg twice daily as noted.  Keep follow-up in May.  Nonrheumatic aortic valve insufficiency Trivial AI and trivial MR on echocardiogram 2022.  As noted, she has significant fatigue as well as frequent PVCs.  Follow-up echocardiogram will be obtained to reassess LV function, aortic valve and mitral valve.  CAD (coronary artery disease) History of nonobstructive coronary artery disease by CT in the past.  Nuclear stress test in 2016 was low risk.  She is not having anginal symptoms.  She has been placed on Plavix for management of her stroke in 2022.  Continue Crestor 20 mg daily, Plavix 75 mg daily.  Hyperlipidemia Labs from [facility]  reviewed.  LDL was optimal at 64 in December 2023.  Continue Crestor 20 mg daily.  Frequent headaches These seem to coincide with elevated blood pressures.  I have adjusted her metoprolol as outlined.  If her blood pressure is better controlled and she continues to have headaches, I have encouraged her to follow-up with primary care for further evaluation.  History of CVA (cerebrovascular accident) She remains on clopidogrel.  Follow-up with neurology as planned.       Dispo:  Return in 6 weeks (on 10/17/2022) for Scheduled Follow Up, w/ Dr. Lynnette Caffey.  Signed, Tereso Newcomer, PA-C

## 2022-09-05 ENCOUNTER — Ambulatory Visit (INDEPENDENT_AMBULATORY_CARE_PROVIDER_SITE_OTHER): Payer: Medicare Other

## 2022-09-05 ENCOUNTER — Encounter: Payer: Self-pay | Admitting: Physician Assistant

## 2022-09-05 ENCOUNTER — Ambulatory Visit: Payer: Medicare Other | Attending: Physician Assistant | Admitting: Physician Assistant

## 2022-09-05 VITALS — BP 130/60 | HR 86 | Ht 61.0 in | Wt 137.4 lb

## 2022-09-05 DIAGNOSIS — I351 Nonrheumatic aortic (valve) insufficiency: Secondary | ICD-10-CM | POA: Diagnosis not present

## 2022-09-05 DIAGNOSIS — I639 Cerebral infarction, unspecified: Secondary | ICD-10-CM | POA: Diagnosis not present

## 2022-09-05 DIAGNOSIS — I2510 Atherosclerotic heart disease of native coronary artery without angina pectoris: Secondary | ICD-10-CM | POA: Insufficient documentation

## 2022-09-05 DIAGNOSIS — Z8673 Personal history of transient ischemic attack (TIA), and cerebral infarction without residual deficits: Secondary | ICD-10-CM | POA: Diagnosis not present

## 2022-09-05 DIAGNOSIS — E782 Mixed hyperlipidemia: Secondary | ICD-10-CM | POA: Diagnosis not present

## 2022-09-05 DIAGNOSIS — R519 Headache, unspecified: Secondary | ICD-10-CM | POA: Diagnosis not present

## 2022-09-05 DIAGNOSIS — I493 Ventricular premature depolarization: Secondary | ICD-10-CM | POA: Insufficient documentation

## 2022-09-05 DIAGNOSIS — I10 Essential (primary) hypertension: Secondary | ICD-10-CM | POA: Diagnosis not present

## 2022-09-05 MED ORDER — METOPROLOL SUCCINATE ER 25 MG PO TB24: 25.0000 mg | ORAL_TABLET | Freq: Two times a day (BID) | ORAL | 3 refills | Status: DC

## 2022-09-05 NOTE — Assessment & Plan Note (Signed)
History of nonobstructive coronary artery disease by CT in the past.  Nuclear stress test in 2016 was low risk.  She is not having anginal symptoms.  She has been placed on Plavix for management of her stroke in 2022.  Continue Crestor 20 mg daily, Plavix 75 mg daily.

## 2022-09-05 NOTE — Assessment & Plan Note (Signed)
Trivial AI and trivial MR on echocardiogram 2022.  As noted, she has significant fatigue as well as frequent PVCs.  Follow-up echocardiogram will be obtained to reassess LV function, aortic valve and mitral valve.

## 2022-09-05 NOTE — Assessment & Plan Note (Signed)
Previous monitor with 18% burden.  Her PVC burden has been fairly stable on ILR interrogation.  She notes significant fatigue.  Her last echocardiogram was in 2022.  In light of her PVCs and previous echocardiogram with mitral regurgitation and aortic insufficiency, I will obtain a follow-up echocardiogram.  I will increase her metoprolol succinate to 25 mg twice daily as noted.  Keep follow-up in May.

## 2022-09-05 NOTE — Assessment & Plan Note (Signed)
These seem to coincide with elevated blood pressures.  I have adjusted her metoprolol as outlined.  If her blood pressure is better controlled and she continues to have headaches, I have encouraged her to follow-up with primary care for further evaluation.

## 2022-09-05 NOTE — Assessment & Plan Note (Signed)
She remains on clopidogrel.  Follow-up with neurology as planned.

## 2022-09-05 NOTE — Assessment & Plan Note (Addendum)
She has had some fluctuations in her blood pressure at home.  I checked her blood pressure with our mercury cuff and it was 130/60 on the right.  With her machine, it was 123/68.  Therefore, I think her machine is fairly accurate.  She has had some fairly elevated blood pressures at home, as high as 160/97.  She has a history of PVCs as well with a burden of 18%.  This is being monitored by her ILR. Increase metoprolol succinate to 25 mg twice daily Continue amlodipine 2.5 mg twice daily, triamterene/HCTZ 37.5/25 mg daily. Keep follow-up in May

## 2022-09-05 NOTE — Patient Instructions (Signed)
Medication Instructions:  Your physician has recommended you make the following change in your medication:   INCREASE th Toprol to twice a day   *If you need a refill on your cardiac medications before your next appointment, please call your pharmacy*   Lab Work: TODAY:  BMET, MAG, & CBC  If you have labs (blood work) drawn today and your tests are completely normal, you will receive your results only by: MyChart Message (if you have MyChart) OR A paper copy in the mail If you have any lab test that is abnormal or we need to change your treatment, we will call you to review the results.   Testing/Procedures: Your physician has requested that you have an echocardiogram. Echocardiography is a painless test that uses sound waves to create images of your heart. It provides your doctor with information about the size and shape of your heart and how well your heart's chambers and valves are working. This procedure takes approximately one hour. There are no restrictions for this procedure. Please do NOT wear cologne, perfume, aftershave, or lotions (deodorant is allowed). Please arrive 15 minutes prior to your appointment time.    Follow-Up: At [facility], you and your health needs are our priority.  As part of our continuing mission to provide you with exceptional heart care, we have created designated Provider Care Teams.  These Care Teams include your primary Cardiologist (physician) and Advanced Practice Providers (APPs -  Physician Assistants and Nurse Practitioners) who all work together to provide you with the care you need, when you need it.  We recommend signing up for the patient portal called "MyChart".  Sign up information is provided on this After Visit Summary.  MyChart is used to connect with patients for Virtual Visits (Telemedicine).  Patients are able to view lab/test results, encounter notes, upcoming appointments, etc.  Non-urgent messages can be sent to your  provider as well.   To learn more about what you can do with MyChart, go to [email or web address].    Your next appointment:   As scheduled   Provider:   Alverda Skeans, MD    Other Instructions Your physician has requested that you regularly monitor and record your blood pressure readings at home. Please use the same machine at the same time of day to check your readings and record them to bring to your follow-up visit.   Please monitor blood pressures and keep a log of your readings.    Make sure to check 2-3 hours after your medications and make sure you rest 15-20 minutes before you check it.   SEND A MYCHART MESSAGE IF YOUR PRESSURES ARE OVER 140/90 3 TIMES OR MORE   AVOID these things for 30 minutes before checking your blood pressure: No Drinking caffeine. No Drinking alcohol. No Eating. No Smoking. No Exercising.   Five minutes before checking your blood pressure: Pee. Sit in a dining chair. Avoid sitting in a soft couch or armchair. Be quiet. Do not talk

## 2022-09-05 NOTE — Assessment & Plan Note (Signed)
Labs from [facility] reviewed.  LDL was optimal at 64 in December 2023.  Continue Crestor 20 mg daily.

## 2022-09-06 ENCOUNTER — Telehealth: Payer: Self-pay | Admitting: Physician Assistant

## 2022-09-06 LAB — BASIC METABOLIC PANEL

## 2022-09-06 LAB — CUP PACEART REMOTE DEVICE CHECK
Date Time Interrogation Session: 20240405231056
Implantable Pulse Generator Implant Date: 20230407

## 2022-09-06 LAB — CBC: MCH: 33.3 pg — ABNORMAL HIGH (ref 26.6–33.0)

## 2022-09-06 NOTE — Telephone Encounter (Signed)
Patient is requesting a call back to discuss results of labs.

## 2022-09-06 NOTE — Telephone Encounter (Signed)
Patient called asking for clarification pertaining to her lab results. Advised pt APP had not reviewed her results and the office will call her to explain the lab results. Patient voiced understanding.

## 2022-09-06 NOTE — Telephone Encounter (Signed)
Her hemoglobin is normal. However, unfortunately, the lab has not completed the other tests yet. I am still waiting on the results of her potassium, magnesium and creatinine. Tereso Newcomer, PA-C    09/06/2022 5:15 PM

## 2022-09-07 LAB — CBC
Hematocrit: 43.3 % (ref 34.0–46.6)
Hemoglobin: 14.8 g/dL (ref 11.1–15.9)
MCHC: 34.2 g/dL (ref 31.5–35.7)
MCV: 97 fL (ref 79–97)
Platelets: 288 10*3/uL (ref 150–450)
RBC: 4.45 x10E6/uL (ref 3.77–5.28)
RDW: 13.1 % (ref 11.7–15.4)
WBC: 5.2 10*3/uL (ref 3.4–10.8)

## 2022-09-07 LAB — BASIC METABOLIC PANEL
CO2: 23 mmol/L (ref 20–29)
Calcium: 10.1 mg/dL (ref 8.7–10.3)
Sodium: 130 mmol/L — ABNORMAL LOW (ref 134–144)

## 2022-09-07 LAB — MAGNESIUM: Magnesium: 2.3 mg/dL (ref 1.6–2.3)

## 2022-09-08 ENCOUNTER — Telehealth: Payer: Self-pay | Admitting: Physician Assistant

## 2022-09-08 DIAGNOSIS — Z79899 Other long term (current) drug therapy: Secondary | ICD-10-CM

## 2022-09-08 NOTE — Telephone Encounter (Signed)
Left voicemail for patient to return call to office. 

## 2022-09-08 NOTE — Telephone Encounter (Signed)
Patient is aware of lab results and provider recommendations   Na chronically low. K+, Creatinine, Hgb, Magnesium normal. K+ is 3.8 >> keep K+ at 4 would help with lessening PVCs. Her sodium is chronically low but this one is lower than prior. HCTZ can contribute to low Na. PLAN: -Ask pt if Triamterene/HCTZ is a tablet. If so, decrease to 1/2 tab once daily. If not, change to tablet form and take 1/2 once daily. -Repeat BMET 2 weeks -Send BP readings in 2 weeks Tereso Newcomer, PA-C    She verbalized understanding. Her triamterence/HCTZ is a tablet and she stated she will cut it in half. She also wanted you to know that she monitors her salt intake and doesn't eat much salt. She would like to know if she should stop monitoring her salt intake.

## 2022-09-08 NOTE — Telephone Encounter (Signed)
  Pt is returning call to get lab result. She said to call her cell number [phone]

## 2022-09-08 NOTE — Telephone Encounter (Signed)
No. She should continue to monitor salt intake. Tereso Newcomer, PA-C    09/08/2022 4:52 PM

## 2022-09-09 MED ORDER — TRIAMTERENE-HCTZ 37.5-25 MG PO TABS: ORAL_TABLET | ORAL | 0 refills | Status: DC

## 2022-09-09 NOTE — Addendum Note (Signed)
Addended by: Burnetta Sabin on: 09/09/2022 10:43 AM   Modules accepted: Orders

## 2022-09-12 NOTE — Progress Notes (Signed)
[facility] Summary Report / Loop Recorder 

## 2022-09-16 ENCOUNTER — Other Ambulatory Visit: Payer: Self-pay

## 2022-09-16 MED ORDER — AMLODIPINE BESYLATE 2.5 MG PO TABS: 2.5000 mg | ORAL_TABLET | Freq: Two times a day (BID) | ORAL | 3 refills | Status: DC

## 2022-09-22 ENCOUNTER — Ambulatory Visit: Payer: Medicare Other | Attending: Cardiology

## 2022-09-22 DIAGNOSIS — Z79899 Other long term (current) drug therapy: Secondary | ICD-10-CM

## 2022-09-23 LAB — BASIC METABOLIC PANEL
BUN/Creatinine Ratio: 26 (ref 12–28)
BUN: 21 mg/dL (ref 8–27)
CO2: 24 mmol/L (ref 20–29)
Calcium: 9.5 mg/dL (ref 8.7–10.3)
Chloride: 94 mmol/L — ABNORMAL LOW (ref 96–106)
Creatinine, Ser: 0.82 mg/dL (ref 0.57–1.00)
Glucose: 107 mg/dL — ABNORMAL HIGH (ref 70–99)
Potassium: 3.9 mmol/L (ref 3.5–5.2)
Sodium: 134 mmol/L (ref 134–144)
eGFR: 70 mL/min/{1.73_m2} (ref >59)

## 2022-09-26 NOTE — Progress Notes (Signed)
Pt has been made aware of normal result and verbalized understanding.  jw

## 2022-10-03 ENCOUNTER — Ambulatory Visit (HOSPITAL_COMMUNITY): Payer: Medicare Other | Attending: Cardiology

## 2022-10-03 DIAGNOSIS — I351 Nonrheumatic aortic (valve) insufficiency: Secondary | ICD-10-CM

## 2022-10-03 DIAGNOSIS — I10 Essential (primary) hypertension: Secondary | ICD-10-CM

## 2022-10-03 DIAGNOSIS — I493 Ventricular premature depolarization: Secondary | ICD-10-CM

## 2022-10-03 LAB — ECHOCARDIOGRAM COMPLETE
Area-P 1/2: 3.17 cm2
S' Lateral: 3.1 cm

## 2022-10-04 ENCOUNTER — Encounter: Payer: Self-pay | Admitting: Physician Assistant

## 2022-10-04 DIAGNOSIS — I420 Dilated cardiomyopathy: Secondary | ICD-10-CM | POA: Insufficient documentation

## 2022-10-04 HISTORY — DX: Dilated cardiomyopathy: I42.0

## 2022-10-06 LAB — CUP PACEART REMOTE DEVICE CHECK
Date Time Interrogation Session: 20240508231450
Implantable Pulse Generator Implant Date: 20230407

## 2022-10-07 ENCOUNTER — Telehealth: Payer: Self-pay | Admitting: *Deleted

## 2022-10-07 DIAGNOSIS — Z79899 Other long term (current) drug therapy: Secondary | ICD-10-CM

## 2022-10-07 DIAGNOSIS — I493 Ventricular premature depolarization: Secondary | ICD-10-CM

## 2022-10-07 MED ORDER — AMIODARONE HCL 200 MG PO TABS: ORAL_TABLET | ORAL | 3 refills | Status: DC

## 2022-10-07 NOTE — Telephone Encounter (Signed)
-----   Message from Beatrice Lecher, [place] sent at 10/07/2022 12:32 PM EDT ----- Reviewed echocardiogram findings with patient by phone. We reviewed treatment options for PVCs and low EF. PLAN: -Start Amiodarone 200 mg - take 2 (400 mg) once daily x 14 days, then 200 mg once daily  -Arrange f/u with Dr. Lalla Brothers in 2 mos -Get CMET, TSH, T4 at f/u with Dr. Naida Sleight, PA-C    10/07/2022 12:32 PM

## 2022-10-10 ENCOUNTER — Ambulatory Visit (INDEPENDENT_AMBULATORY_CARE_PROVIDER_SITE_OTHER): Payer: Medicare Other

## 2022-10-10 DIAGNOSIS — I639 Cerebral infarction, unspecified: Secondary | ICD-10-CM

## 2022-10-12 ENCOUNTER — Telehealth: Payer: Self-pay | Admitting: Physician Assistant

## 2022-10-12 NOTE — Telephone Encounter (Signed)
Returned pt's call via cell phone number. Pt stated she was having trouble sleeping and she had started to get a rash on her lower stomach area. Told pt that Tereso Newcomer, PA said it was ok to stop the amiodarone and that if the rash doe not resolve with stopping amiodarone that she would need to contact her PCP as it may not be a reaction to the medication. She said she had spoke to someone yesterday about getting in to see Dr. Lalla Brothers but that she plans to see Dr. Lynnette Caffey on Monday. I restated that her appointment is on 10/17/22 at 11:30 am. Instructed Pt to call if she had any additional questions or if she started having any other symptoms. Pt stated understanding.

## 2022-10-12 NOTE — Telephone Encounter (Signed)
Stop amiodarone. If rash does not resolve with stopping Amiodarone, see primary care for acute visit as it may not be related to the drug. F/u with Dr. Lynnette Caffey as planned. Please make sure she is getting an appointment with Dr. Lalla Brothers. Tereso Newcomer, PA-C    10/12/2022 1:33 PM

## 2022-10-12 NOTE — Telephone Encounter (Signed)
Pt c/o medication issue:  1. Name of Medication: amiodarone (PACERONE) 200 MG tablet   2. How are you currently taking this medication (dosage and times per day)? 2 tablets for 2 weeks and then reduce to 1 tablet  3. Are you having a reaction (difficulty breathing--STAT)? no  4. What is your medication issue? Patient states she was put on the medication for her PVC's and was told there could be serious side effects. She says she started it Friday and has had no energy. She says below her stomach and her groin started itching and the skin is red with blisters. She says when she goes to bed and takes her sleeping pill it would usually puts her to sleep, but now she is staying awake. She says she is getting more unsteady on her feet and she cannot afford to have another fall. She says she feels lousy feels like she needs to lay down in bed. She states she has also felt really nervous and jittery. She says it is okay to call either her home or cell and that she needs to know something today.

## 2022-10-12 NOTE — Progress Notes (Signed)
Cardiology Office Note:    Date:  10/17/2022   ID:  [name], DOB 10/18/1933, MRN [id number]  PCP:  Geoffry Paradise, MD   [facility] Providers Cardiologist:  Alverda Skeans, MD Referring MD: Geoffry Paradise, MD   Chief Complaint/Reason for Referral: Cardiology follow-up  ASSESSMENT:    1. Cardiomyopathy, unspecified type (HCC)   2. PVC's (premature ventricular contractions)   3. Hyperlipidemia LDL goal <70   4. Essential hypertension   5. Cryptogenic stroke (HCC)   6. Aortic atherosclerosis (HCC)   7. CKD (chronic kidney disease) stage 2, GFR 60-89 ml/min   8. Coronary artery disease involving native coronary artery of native heart without angina pectoris     PLAN:    In order of problems listed above: 1.  Cardiomyopathy: Given the patient's advanced age and frailty, I think medical therapy should be pursued.  She had a relatively reassuring coronary CTA in 2008 which admittedly was quite a while ago.  Continue Toprol but will consolidate to once a day bedtime dosing.  Since she is having some fatigue in the morning.  She still demonstrates some occasional PVCs so I will increase her Toprol dosage to 62.5 mg at bedtime; her heart rate is 79 her blood pressure is 132/78 so I think she can tolerate this.  Will discontinue amlodipine and Maxide and start losartan 12.5 mg at bedtime as well; will check creatinine next week.  Will consider the addition of Jardiance in the future.  The patient will be seeing EP in 2 months.  Will pursue echocardiogram in 3 months for assessment of ejection fraction. 2.  PVCs: On amiodarone given elevated burden but developed a rash so this was stopped.  Will intensify Toprol from 50 mg total in a day to 62.5 mg.  EKG today still demonstrates occasional PVCs. 3.  Hyperlipidemia: Check lipid panel and LFTs. 4.  Hypertension: Blood pressure is well-controlled on her current regimen but will stop amlodipine and Maxide and start losartan due to  ejection fraction of 42%. 5.  Stroke: Continue Plavix monotherapy.   6.  Aortic atherosclerosis: Continue Plavix monotherapy and Crestor 20 mg. 7.  CKD stage II: Will start losartan and check BMP in 1 week. 8.  Coronary artery disease: This was mild on coronary CTA 2008.  Given advanced age and fraility, we will continue medical therapy with Toprol, Plavix, and statin.  Hopefully her ejection fraction will improve with medical therapy.  If not an ischemic evaluation could be pursued but again I think the threshold in this older and relatively frail patient is very high.  The patient agrees with this plan.  If an ischemic evaluation were required, I think a coronary CTA would be the appropriate next step if the PVC burden is minimal during image acquisition.           Dispo:  Return in about 3 months (around 01/17/2023).      Medication Adjustments/Labs and Tests Ordered: Current medicines are reviewed at length with the patient today.  Concerns regarding medicines are outlined above.  The following changes have been made:     Labs/tests ordered: Orders Placed This Encounter  Procedures   Comprehensive metabolic panel   Lipid panel   EKG 12-Lead   ECHOCARDIOGRAM COMPLETE    Medication Changes: Meds ordered this encounter  Medications   DISCONTD: losartan (COZAAR) 50 MG tablet    Sig: Take 1 tablet (50 mg total) by mouth daily.    Dispense:  90 tablet  Refill:  3   metoprolol succinate (TOPROL XL) 25 MG 24 hr tablet    Sig: Take 2.5 tablets (62.5 mg total) by mouth at bedtime.    Dispense:  225 tablet    Refill:  3   losartan (COZAAR) 25 MG tablet    Sig: Take 0.5 tablets (12.5 mg total) by mouth daily.    Dispense:  45 tablet    Refill:  3     Current medicines are reviewed at length with the patient today.  The patient does not have concerns regarding medicines.   History of Present Illness:    FOCUSED PROBLEM LIST:   1.  Mild obstructive coronary artery disease  with calcium score of 1 on coronary CTA 2008 2.  Hyperlipidemia 3.  Hypertension 4.  History of stroke 2022; ILR in place 5.  PVCs 6.  GERD 7.  Aortic atherosclerosis on CT abdomen pelvis 2022 8.  CKD stage II 9.  Frailty 10.  MGUS; followed by oncology  The patient is a 89 y.o. female with the indicated medical history here for routine follow-up.  She had previously been cared for by Dr. Katrinka Blazing.  She saw Tereso Newcomer last month.  At that point in time she was noted to be irregular rhythm.  Her metoprolol succinate was increased to 25 mg twice a day.  Given PVC burden complaints of fatigue an echocardiogram was obtained which demonstrated an ejection fraction of 42% with no significant valvular abnormalities.  Given her PVC burden of 11% amiodarone was initiated however the patient developed a rash thought in response and so it was discontinued.  Eventually her Toprol was increased to 25 mg twice a day and her Norvasc and Maxide were decreased.  She tells me she feels less palpitations.  She still is very weak and really gets worn out with moderate exertion.  She used to be quite active.  She seems to have taken quite a step back after her stroke in 2022.  Apparently this was several months after a COVID booster shot.  It sounds like he will believe that the COVID booster shot was related to the stroke.  She tells me she has not felt right since she took the COVID booster shot.  She denies any exertional angina, frank syncope, paroxysmal nocturnal dyspnea, or orthopnea.  Her legs were little bit more swollen when on amiodarone but this is gotten better since she stopped the medication.  She fortunately has not required any emergency room visits or hospitalizations.          Current Medications: Current Meds  Medication Sig   ALPRAZolam (XANAX) 0.5 MG tablet Take 0.25 mg by mouth 2 (two) times daily as needed (restless legs).   amoxicillin (AMOXIL) 500 MG capsule Take 2,000 mg by mouth as needed  (dental). Take 4 capsules (2000 mg) by mouth one hour prior to dental appointments   Ascorbic Acid (VITAMIN C PO) Take 1 tablet by mouth daily.   Calcium Carbonate-Vitamin D (CALCIUM-D PO) Take 1 tablet by mouth daily.   celecoxib (CELEBREX) 200 MG capsule Take 1 tablet by mouth as needed.   clopidogrel (PLAVIX) 75 MG tablet Take 1 tablet (75 mg total) by mouth daily.   dexlansoprazole (DEXILANT) 60 MG capsule Take 60 mg by mouth every morning.   escitalopram (LEXAPRO) 10 MG tablet Take 10 mg by mouth at bedtime.   levothyroxine (SYNTHROID, LEVOTHROID) 25 MCG tablet Take 25 mcg by mouth daily before breakfast.    losartan (  COZAAR) 25 MG tablet Take 0.5 tablets (12.5 mg total) by mouth daily.   metoprolol succinate (TOPROL XL) 25 MG 24 hr tablet Take 2.5 tablets (62.5 mg total) by mouth at bedtime.   Multiple Vitamin (MULTIVITAMIN WITH MINERALS) TABS tablet Take 1 tablet by mouth daily.   NONFORMULARY OR COMPOUNDED ITEM Apply 1 application  topically See admin instructions. Transdermal therapeutics  meloxicam 0.5%, doxepin 3%, amantadine 3%, dextromethorphan 2%, lidocaine 2%. - compounded at [facility]:  Apply topically to legs daily after showering for neuropathy   pyridoxine (B-6) 500 MG tablet Take 1 tablet (500 mg total) by mouth daily.   rosuvastatin (CRESTOR) 20 MG tablet Take 1 tablet (20 mg total) by mouth daily.   Vitamin D, Ergocalciferol, (DRISDOL) 50000 UNITS CAPS Take 50,000 Units by mouth every Friday.   VITAMIN E PO Take 1 capsule by mouth daily.   Wheat Dextrin (BENEFIBER) POWD Take 1 daily dose   zolpidem (AMBIEN CR) 12.5 MG CR tablet Take 12.5 mg by mouth at bedtime.   [DISCONTINUED] amLODipine (NORVASC) 2.5 MG tablet Take 1 tablet (2.5 mg total) by mouth in the morning and at bedtime.   [DISCONTINUED] losartan (COZAAR) 50 MG tablet Take 1 tablet (50 mg total) by mouth daily.   [DISCONTINUED] metoprolol succinate (TOPROL XL) 25 MG 24 hr tablet Take 1 tablet (25 mg  total) by mouth in the morning and at bedtime.   [DISCONTINUED] triamterene-hydrochlorothiazide (MAXZIDE-25) 37.5-25 MG tablet TAKE 1/2 TABLET BY MOUTH DAILY     Allergies:    Patient has no known allergies.   Social History:   Social History   Tobacco Use   Smoking status: Never   Smokeless tobacco: Never  Vaping Use   Vaping Use: Never used  Substance Use Topics   Alcohol use: No   Drug use: No     Family Hx: Family History  Problem Relation Age of Onset   Heart disease Mother    Heart disease Father    Stroke Father    Hypertension Sister    Diabetes Sister    Ovarian cancer Sister    Diabetes Sister    Rectal cancer Sister        ? Colon or rectal   Hyperlipidemia Sister    Hypertension Sister    Hyperlipidemia Sister    Hypertension Sister    Hypertension Brother    Heart disease Brother    Heart disease Brother    Hypertension Brother    Heart disease Brother    Heart disease Brother    Heart disease Brother    Heart disease Brother    Breast cancer Niece    Heart disease Son    Hyperlipidemia Son    Hyperlipidemia Daughter    Hypertension Daughter      Review of Systems:   Please see the history of present illness.    All other systems reviewed and are negative.     EKGs/Labs/Other Test Reviewed:    EKG:  EKG performed April 2024 that I personally reviewed demonstrates SR with PVCs; EKG performed today that I personally reviewed demonstrates sinus rhythm with occasional PVCs  Prior CV studies:  Cardiac Studies & Procedures     STRESS TESTS  MYOCARDIAL PERFUSION IMAGING 03/30/2015  Narrative  Nuclear stress EF: 83%.  There was no ST segment deviation noted during stress.  The study is normal. No evidence of ischemia.  This is a low risk study.   ECHOCARDIOGRAM  ECHOCARDIOGRAM COMPLETE 10/03/2022  Narrative ECHOCARDIOGRAM REPORT    Patient Name:   [name] Date of Exam: 10/03/2022 Medical Rec #:  [id number]      Height:        61.0 in Accession #:    [id number]     Weight:       137.4 lb Date of Birth:  05-May-1934      BSA:          1.610 m Patient Age:    88 years       BP:           130/60 mmHg Patient Gender: F              HR:           76 bpm. Exam Location:  [address]  Procedure: 2D Echo, Cardiac Doppler and Color Doppler  Indications:    Frequent PVCs; I49.3 PVC  History:        Patient has prior history of Echocardiogram examinations, most recent 04/21/2021. CAD; Risk Factors:Hypertension and Dyslipidemia.  Sonographer:    Thurman Coyer RDCS Referring Phys: [id number] Evern Bio WEAVER   Sonographer Comments: 3D Heart Model was attempted. IMPRESSIONS   1. Left ventricular ejection fraction by 3D volume is 42 %. The left ventricle has mildly decreased function. The left ventricle demonstrates global hypokinesis. Left ventricular diastolic parameters are indeterminate. 2. Right ventricular systolic function is normal. The right ventricular size is normal. 3. The mitral valve is normal in structure. Mild mitral valve regurgitation. No evidence of mitral stenosis. 4. The aortic valve is normal in structure. Aortic valve regurgitation is not visualized. No aortic stenosis is present. 5. There is dilatation of the ascending aorta, measuring 38 mm. 6. The inferior vena cava is normal in size with greater than 50% respiratory variability, suggesting right atrial pressure of 3 mmHg.  FINDINGS Left Ventricle: Left ventricular ejection fraction by 3D volume is 42 %. The left ventricle has mildly decreased function. The left ventricle demonstrates global hypokinesis. The left ventricular internal cavity size was normal in size. There is no left ventricular hypertrophy. Left ventricular diastolic parameters are indeterminate.  Right Ventricle: The right ventricular size is normal. No increase in right ventricular wall thickness. Right ventricular systolic function is normal.  Left Atrium: Left atrial size  was normal in size.  Right Atrium: Right atrial size was normal in size.  Pericardium: There is no evidence of pericardial effusion.  Mitral Valve: The mitral valve is normal in structure. Mild mitral valve regurgitation. No evidence of mitral valve stenosis.  Tricuspid Valve: The tricuspid valve is normal in structure. Tricuspid valve regurgitation is trivial. No evidence of tricuspid stenosis.  Aortic Valve: The aortic valve is normal in structure. Aortic valve regurgitation is not visualized. No aortic stenosis is present.  Pulmonic Valve: The pulmonic valve was normal in structure. Pulmonic valve regurgitation is not visualized. No evidence of pulmonic stenosis.  Aorta: The aortic root is normal in size and structure. There is dilatation of the ascending aorta, measuring 38 mm.  Venous: The inferior vena cava is normal in size with greater than 50% respiratory variability, suggesting right atrial pressure of 3 mmHg.  IAS/Shunts: No atrial level shunt detected by color flow Doppler.   LEFT VENTRICLE PLAX 2D LVIDd:         4.00 cm         Diastology LVIDs:         3.10 cm  LV e' medial:    7.22 cm/s LV PW:         0.80 cm         LV E/e' medial:  10.2 LV IVS:        0.80 cm         LV e' lateral:   7.14 cm/s LVOT diam:     2.00 cm         LV E/e' lateral: 10.4 LV SV:         56 LV SV Index:   35 LVOT Area:     3.14 cm        3D Volume EF LV 3D EF:    Left ventricul ar ejection fraction by 3D volume is 42 %.  3D Volume EF: 3D EF:        42 % LV EDV:       93 ml LV ESV:       54 ml LV SV:        39 ml  RIGHT VENTRICLE RV Basal diam:  3.00 cm RV Mid diam:    2.80 cm RV S prime:     9.89 cm/s TAPSE (M-mode): 1.7 cm  LEFT ATRIUM             Index        RIGHT ATRIUM           Index LA diam:        2.90 cm 1.80 cm/m   RA Area:     10.10 cm LA Vol (A2C):   41.6 ml 25.84 ml/m  RA Volume:   20.00 ml  12.42 ml/m LA Vol (A4C):   57.1 ml 35.46 ml/m LA  Biplane Vol: 48.3 ml 30.00 ml/m AORTIC VALVE LVOT Vmax:   87.50 cm/s LVOT Vmean:  55.000 cm/s LVOT VTI:    0.179 m  AORTA Ao Root diam: 2.70 cm Ao Asc diam:  3.80 cm  MITRAL VALVE MV Area (PHT): 3.17 cm    SHUNTS MV Decel Time: 239 msec    Systemic VTI:  0.18 m MV E velocity: 73.90 cm/s  Systemic Diam: 2.00 cm MV A velocity: 79.10 cm/s MV E/A ratio:  0.93  Kardie Tobb DO Electronically signed by Thomasene Ripple DO Signature Date/Time: 10/03/2022/3:48:53 PM    Final    MONITORS  CARDIAC EVENT MONITOR 07/11/2021  Narrative  Normal sinus rhythm  Frequent PVC's with 18% burden  PAC's with 3% burden  No sustained tachyarrhythmia  No atrial fibrillation  Some symptoms of flutter and heart racing correlate with PAC's and PVC's.  No atrial fibrillation High burden PVC's           Other studies Reviewed: Review of the additional studies/records demonstrates: Aortic atherosclerosis on CT abdomen pelvis 2022  Recent Labs: 06/22/2022: ALT 17 09/05/2022: Hemoglobin 14.8; Magnesium 2.3; Platelets 288 09/22/2022: BUN 21; Creatinine, Ser 0.82; Potassium 3.9; Sodium 134   Recent Lipid Panel Lab Results  Component Value Date/Time   CHOL 167 02/24/2022 11:42 AM   TRIG 105 02/24/2022 11:42 AM   HDL 80 02/24/2022 11:42 AM   LDLCALC 66 02/24/2022 11:42 AM    Risk Assessment/Calculations:                Physical Exam:    VS:  BP 132/78   Pulse 79   Ht 5\' 1"  (1.549 m)   Wt 138 lb 12.8 oz (63 kg)   SpO2 97%   BMI 26.23 kg/m  Wt Readings from Last 3 Encounters:  10/17/22 138 lb 12.8 oz (63 kg)  09/05/22 137 lb 6.4 oz (62.3 kg)  08/24/22 137 lb (62.1 kg)    GENERAL:  No apparent distress, AOx3 HEENT:  No carotid bruits, +2 carotid impulses, no scleral icterus CAR: RRR with occ ectopy no murmurs, gallops, rubs, or thrills RES:  Clear to auscultation bilaterally ABD:  Soft, nontender, nondistended, positive bowel sounds x 4 VASC:  +2 radial pulses, +2 carotid  pulses, palpable pedal pulses NEURO:  CN 2-12 grossly intact; motor and sensory grossly intact PSYCH:  No active depression or anxiety EXT:  No edema, ecchymosis, or cyanosis  Signed, Orbie Pyo, MD  10/17/2022 1:04 PM    [facility] [address] [place], [place], [place]  [address] Phone: [phone]; Fax: [phone]   Note:  This document was prepared using Dragon voice recognition software and may include unintentional dictation errors.

## 2022-10-13 NOTE — Progress Notes (Signed)
[facility] Summary Report / Loop Recorder 

## 2022-10-17 ENCOUNTER — Encounter: Payer: Self-pay | Admitting: Internal Medicine

## 2022-10-17 ENCOUNTER — Ambulatory Visit: Payer: Medicare Other | Admitting: Internal Medicine

## 2022-10-17 ENCOUNTER — Ambulatory Visit: Payer: Medicare Other | Attending: Internal Medicine | Admitting: Internal Medicine

## 2022-10-17 VITALS — BP 132/78 | HR 79 | Ht 61.0 in | Wt 138.8 lb

## 2022-10-17 DIAGNOSIS — I429 Cardiomyopathy, unspecified: Secondary | ICD-10-CM | POA: Diagnosis not present

## 2022-10-17 DIAGNOSIS — N182 Chronic kidney disease, stage 2 (mild): Secondary | ICD-10-CM | POA: Insufficient documentation

## 2022-10-17 DIAGNOSIS — I2510 Atherosclerotic heart disease of native coronary artery without angina pectoris: Secondary | ICD-10-CM | POA: Diagnosis not present

## 2022-10-17 DIAGNOSIS — E785 Hyperlipidemia, unspecified: Secondary | ICD-10-CM | POA: Insufficient documentation

## 2022-10-17 DIAGNOSIS — I493 Ventricular premature depolarization: Secondary | ICD-10-CM | POA: Diagnosis not present

## 2022-10-17 DIAGNOSIS — I700 Atherosclerosis of aorta: Secondary | ICD-10-CM | POA: Diagnosis not present

## 2022-10-17 DIAGNOSIS — I639 Cerebral infarction, unspecified: Secondary | ICD-10-CM | POA: Insufficient documentation

## 2022-10-17 DIAGNOSIS — I10 Essential (primary) hypertension: Secondary | ICD-10-CM | POA: Insufficient documentation

## 2022-10-17 MED ORDER — LOSARTAN POTASSIUM 25 MG PO TABS: 12.5000 mg | ORAL_TABLET | Freq: Every day | ORAL | 3 refills | Status: DC

## 2022-10-17 MED ORDER — LOSARTAN POTASSIUM 50 MG PO TABS: 50.0000 mg | ORAL_TABLET | Freq: Every day | ORAL | 3 refills | Status: DC

## 2022-10-17 MED ORDER — METOPROLOL SUCCINATE ER 25 MG PO TB24: 62.5000 mg | ORAL_TABLET | Freq: Every day | ORAL | 3 refills | Status: DC

## 2022-10-17 NOTE — Patient Instructions (Signed)
Medication Instructions:  Your physician has recommended you make the following change in your medication:   Stop taking amlodipine Stop taking Maxzide. Increase Toprol XL to 62.5 mg at bedtime Losartan 12.5 mg at bedtime  *If you need a refill on your cardiac medications before your next appointment, please call your pharmacy*   Lab Work: CMP and lipids in 1 week If you have labs (blood work) drawn today and your tests are completely normal, you will receive your results only by: MyChart Message (if you have MyChart) OR A paper copy in the mail If you have any lab test that is abnormal or we need to change your treatment, we will call you to review the results.   Testing/Procedures: ECHO on 8/21 -same day as appointment with Tereso Newcomer  Your physician has requested that you have an echocardiogram. Echocardiography is a painless test that uses sound waves to create images of your heart. It provides your doctor with information about the size and shape of your heart and how well your heart's chambers and valves are working. This procedure takes approximately one hour. There are no restrictions for this procedure. Please do NOT wear cologne, perfume, aftershave, or lotions (deodorant is allowed). Please arrive 15 minutes prior to your appointment time.    Follow-Up: At [facility], you and your health needs are our priority.  As part of our continuing mission to provide you with exceptional heart care, we have created designated Provider Care Teams.  These Care Teams include your primary Cardiologist (physician) and Advanced Practice Providers (APPs -  Physician Assistants and Nurse Practitioners) who all work together to provide you with the care you need, when you need it.  Your next appointment:   As scheduled  Provider:   Tereso Newcomer

## 2022-10-21 ENCOUNTER — Inpatient Hospital Stay: Payer: Medicare Other | Attending: Hematology & Oncology

## 2022-10-21 ENCOUNTER — Inpatient Hospital Stay (HOSPITAL_BASED_OUTPATIENT_CLINIC_OR_DEPARTMENT_OTHER): Payer: Medicare Other | Admitting: Hematology & Oncology

## 2022-10-21 ENCOUNTER — Encounter: Payer: Self-pay | Admitting: Hematology & Oncology

## 2022-10-21 ENCOUNTER — Other Ambulatory Visit: Payer: Self-pay

## 2022-10-21 VITALS — BP 162/83 | HR 64 | Temp 97.9°F | Resp 18 | Ht 61.0 in | Wt 139.1 lb

## 2022-10-21 DIAGNOSIS — M791 Myalgia, unspecified site: Secondary | ICD-10-CM | POA: Diagnosis not present

## 2022-10-21 DIAGNOSIS — D472 Monoclonal gammopathy: Secondary | ICD-10-CM | POA: Insufficient documentation

## 2022-10-21 DIAGNOSIS — Z7902 Long term (current) use of antithrombotics/antiplatelets: Secondary | ICD-10-CM | POA: Insufficient documentation

## 2022-10-21 DIAGNOSIS — I509 Heart failure, unspecified: Secondary | ICD-10-CM | POA: Diagnosis not present

## 2022-10-21 DIAGNOSIS — R519 Headache, unspecified: Secondary | ICD-10-CM | POA: Insufficient documentation

## 2022-10-21 DIAGNOSIS — Z79899 Other long term (current) drug therapy: Secondary | ICD-10-CM | POA: Insufficient documentation

## 2022-10-21 LAB — CBC WITH DIFFERENTIAL (CANCER CENTER ONLY)
Abs Immature Granulocytes: 0.01 10*3/uL (ref 0.00–0.07)
Basophils Absolute: 0 10*3/uL (ref 0.0–0.1)
Basophils Relative: 0 %
Eosinophils Absolute: 0 10*3/uL (ref 0.0–0.5)
Eosinophils Relative: 1 %
HCT: 39.6 % (ref 36.0–46.0)
Hemoglobin: 13.3 g/dL (ref 12.0–15.0)
Immature Granulocytes: 0 %
Lymphocytes Relative: 30 %
Lymphs Abs: 1.3 10*3/uL (ref 0.7–4.0)
MCH: 33.3 pg (ref 26.0–34.0)
MCHC: 33.6 g/dL (ref 30.0–36.0)
MCV: 99.2 fL (ref 80.0–100.0)
Monocytes Absolute: 0.6 10*3/uL (ref 0.1–1.0)
Monocytes Relative: 13 %
Neutro Abs: 2.4 10*3/uL (ref 1.7–7.7)
Neutrophils Relative %: 56 %
Platelet Count: 242 10*3/uL (ref 150–400)
RBC: 3.99 MIL/uL (ref 3.87–5.11)
RDW: 12.4 % (ref 11.5–15.5)
WBC Count: 4.2 10*3/uL (ref 4.0–10.5)
nRBC: 0 % (ref 0.0–0.2)

## 2022-10-21 LAB — CMP (CANCER CENTER ONLY)
ALT: 19 U/L (ref 0–44)
AST: 25 U/L (ref 15–41)
Albumin: 4.5 g/dL (ref 3.5–5.0)
Alkaline Phosphatase: 56 U/L (ref 38–126)
Anion gap: 7 (ref 5–15)
BUN: 18 mg/dL (ref 8–23)
CO2: 31 mmol/L (ref 22–32)
Calcium: 10 mg/dL (ref 8.9–10.3)
Chloride: 96 mmol/L — ABNORMAL LOW (ref 98–111)
Creatinine: 0.89 mg/dL (ref 0.44–1.00)
GFR, Estimated: >60 mL/min (ref >60)
Glucose, Bld: 90 mg/dL (ref 70–99)
Potassium: 4.2 mmol/L (ref 3.5–5.1)
Sodium: 134 mmol/L — ABNORMAL LOW (ref 135–145)
Total Bilirubin: 0.7 mg/dL (ref 0.3–1.2)
Total Protein: 6.5 g/dL (ref 6.5–8.1)

## 2022-10-21 LAB — LACTATE DEHYDROGENASE: LDH: 168 U/L (ref 98–192)

## 2022-10-21 NOTE — Progress Notes (Signed)
Hematology and Oncology Follow Up Visit  [name] [id number] Jun 21, 1933 89 y.o. 10/21/2022   Principle Diagnosis:  IgA Kappa MGUS  Current Therapy:   Observation     Interim History:  [name] is back for follow-up.  We last saw her back in January.  Since then, she been having a lot of cardiac issues.  She has a problems with amiodarone.  It sounds like she has some atrial fibrillation.  She cannot be kept on amiodarone.  She now is on Toprol.  I know that cardiology is following her.  She did have a echocardiogram that was done.  This was done 3 weeks ago.  This did show that she had some mild heart failure with a left ejection fraction of 42%.  She is really worried about her cardiac function.  As far as a IgA kappa MGUS, this has been holding pretty steady.  When we last checked her monoclonal studies back in January, her monoclonal spike was 0.2 g/dL.  The IgA was 380 mg/dL.  The Kappa light chain was 3.1 mg/dL.  She has had no nausea or vomiting.  There is been no change in bowel or bladder habits.  She has had no rashes, outside associated with amiodarone..  She has had no leg swelling.  Overall, I would say that her performance status is probably ECOG 2.    Medications:  Current Outpatient Medications:    ALPRAZolam (XANAX) 0.5 MG tablet, Take 0.25 mg by mouth 2 (two) times daily as needed (restless legs)., Disp: , Rfl:    Ascorbic Acid (VITAMIN C PO), Take 1 tablet by mouth daily., Disp: , Rfl:    Calcium Carbonate-Vitamin D (CALCIUM-D PO), Take 1 tablet by mouth daily., Disp: , Rfl:    celecoxib (CELEBREX) 200 MG capsule, Take 1 tablet by mouth as needed., Disp: , Rfl:    clopidogrel (PLAVIX) 75 MG tablet, Take 1 tablet (75 mg total) by mouth daily., Disp: 30 tablet, Rfl: 3   dexlansoprazole (DEXILANT) 60 MG capsule, Take 60 mg by mouth every morning., Disp: , Rfl:    escitalopram (LEXAPRO) 10 MG tablet, Take 10 mg by mouth at bedtime., Disp: , Rfl:     levothyroxine (SYNTHROID, LEVOTHROID) 25 MCG tablet, Take 25 mcg by mouth daily before breakfast. , Disp: , Rfl:    losartan (COZAAR) 25 MG tablet, Take 0.5 tablets (12.5 mg total) by mouth daily., Disp: 45 tablet, Rfl: 3   metoprolol succinate (TOPROL XL) 25 MG 24 hr tablet, Take 2.5 tablets (62.5 mg total) by mouth at bedtime., Disp: 225 tablet, Rfl: 3   Multiple Vitamin (MULTIVITAMIN WITH MINERALS) TABS tablet, Take 1 tablet by mouth daily., Disp: , Rfl:    NONFORMULARY OR COMPOUNDED ITEM, Apply 1 application  topically See admin instructions. Transdermal therapeutics  meloxicam 0.5%, doxepin 3%, amantadine 3%, dextromethorphan 2%, lidocaine 2%. - compounded at [facility]:  Apply topically to legs daily after showering for neuropathy, Disp: , Rfl:    pyridoxine (B-6) 500 MG tablet, Take 1 tablet (500 mg total) by mouth daily., Disp: 30 tablet, Rfl: 5   rosuvastatin (CRESTOR) 20 MG tablet, Take 1 tablet (20 mg total) by mouth daily., Disp: 30 tablet, Rfl: 0   Vitamin D, Ergocalciferol, (DRISDOL) 50000 UNITS CAPS, Take 50,000 Units by mouth every Friday., Disp: , Rfl:    VITAMIN E PO, Take 1 capsule by mouth daily., Disp: , Rfl:    Wheat Dextrin (BENEFIBER) POWD, Take 1 daily dose, Disp:  500 g, Rfl: 0   zolpidem (AMBIEN CR) 12.5 MG CR tablet, Take 12.5 mg by mouth at bedtime., Disp: , Rfl:    amoxicillin (AMOXIL) 500 MG capsule, Take 2,000 mg by mouth as needed (dental). Take 4 capsules (2000 mg) by mouth one hour prior to dental appointments, Disp: , Rfl:   Allergies: No Known Allergies  Past Medical History, Surgical history, Social history, and Family History were reviewed and updated.  Review of Systems: Review of Systems  Constitutional: Negative.  Negative for appetite change, fatigue, fever and unexpected weight change.  HENT:  Negative.  Negative for lump/mass, mouth sores, sore throat and trouble swallowing.   Eyes: Negative.   Respiratory: Negative.  Negative for cough,  hemoptysis and shortness of breath.   Cardiovascular: Negative.  Negative for leg swelling and palpitations.  Gastrointestinal: Negative.  Negative for abdominal distention, abdominal pain, blood in stool, constipation, diarrhea, nausea and vomiting.  Endocrine: Negative.   Genitourinary: Negative.  Negative for bladder incontinence, dysuria, frequency and hematuria.   Musculoskeletal:  Positive for myalgias. Negative for arthralgias, back pain and gait problem.  Skin: Negative.  Negative for itching and rash.  Neurological:  Positive for headaches. Negative for dizziness, extremity weakness, gait problem, numbness, seizures and speech difficulty.  Hematological: Negative.  Does not bruise/bleed easily.  Psychiatric/Behavioral: Negative.  Negative for depression and sleep disturbance. The patient is not nervous/anxious.     Physical Exam:  height is 5\' 1"  (1.549 m) and weight is 139 lb 1.9 oz (63.1 kg). Her oral temperature is 97.9 F (36.6 C). Her blood pressure is 162/83 (abnormal) and her pulse is 64. Her respiration is 18 and oxygen saturation is 100%.   Wt Readings from Last 3 Encounters:  10/21/22 139 lb 1.9 oz (63.1 kg)  10/17/22 138 lb 12.8 oz (63 kg)  09/05/22 137 lb 6.4 oz (62.3 kg)    Physical Exam Vitals reviewed.  HENT:     Head: Normocephalic and atraumatic.  Eyes:     Pupils: Pupils are equal, round, and reactive to light.  Cardiovascular:     Rate and Rhythm: Normal rate and regular rhythm.     Heart sounds: Normal heart sounds.  Pulmonary:     Effort: Pulmonary effort is normal.     Breath sounds: Normal breath sounds.  Abdominal:     General: Bowel sounds are normal.     Palpations: Abdomen is soft.  Musculoskeletal:        General: No tenderness or deformity. Normal range of motion.     Cervical back: Normal range of motion.  Lymphadenopathy:     Cervical: No cervical adenopathy.  Skin:    General: Skin is warm and dry.     Findings: No erythema or rash.   Neurological:     Mental Status: She is alert and oriented to person, place, and time.  Psychiatric:        Behavior: Behavior normal.        Thought Content: Thought content normal.        Judgment: Judgment normal.      Lab Results  Component Value Date   WBC 4.2 10/21/2022   HGB 13.3 10/21/2022   HCT 39.6 10/21/2022   MCV 99.2 10/21/2022   PLT 242 10/21/2022     Chemistry      Component Value Date/Time   NA 134 (L) 10/21/2022 1144   NA 134 09/22/2022 1030   K 4.2 10/21/2022 1144   CL 96 (L)  10/21/2022 1144   CO2 31 10/21/2022 1144   BUN 18 10/21/2022 1144   BUN 21 09/22/2022 1030   CREATININE 0.89 10/21/2022 1144      Component Value Date/Time   CALCIUM 10.0 10/21/2022 1144   ALKPHOS 56 10/21/2022 1144   AST 25 10/21/2022 1144   ALT 19 10/21/2022 1144   BILITOT 0.7 10/21/2022 1144      Impression and Plan: [name] is a 89 year old white female.  She has an IgA kappa MGUS.  This is still at a very low level.  This certainly could be from her age alone.  I feel bad that she has had this cardiac issue.  Hopefully, cardiology will be able to manage this.  As always, I know they do a fantastic job with all of their patients.  We will try to get her through the Summer.  I know she has a lot that she is worried about.  We will certainly stay strong in prayer for her.    Josph Macho, MD 5/24/20241:13 PM

## 2022-10-23 LAB — IGG, IGA, IGM
IgA: 322 mg/dL (ref 64–422)
IgG (Immunoglobin G), Serum: 629 mg/dL (ref 586–1602)
IgM (Immunoglobulin M), Srm: 63 mg/dL (ref 26–217)

## 2022-10-25 LAB — KAPPA/LAMBDA LIGHT CHAINS
Kappa free light chain: 23 mg/L — ABNORMAL HIGH (ref 3.3–19.4)
Kappa, lambda light chain ratio: 1.44 (ref 0.26–1.65)
Lambda free light chains: 16 mg/L (ref 5.7–26.3)

## 2022-10-26 ENCOUNTER — Ambulatory Visit: Payer: Medicare Other | Attending: Internal Medicine

## 2022-10-26 DIAGNOSIS — I493 Ventricular premature depolarization: Secondary | ICD-10-CM | POA: Diagnosis not present

## 2022-10-26 DIAGNOSIS — N182 Chronic kidney disease, stage 2 (mild): Secondary | ICD-10-CM | POA: Diagnosis not present

## 2022-10-26 DIAGNOSIS — I2510 Atherosclerotic heart disease of native coronary artery without angina pectoris: Secondary | ICD-10-CM

## 2022-10-26 DIAGNOSIS — I10 Essential (primary) hypertension: Secondary | ICD-10-CM

## 2022-10-26 DIAGNOSIS — E785 Hyperlipidemia, unspecified: Secondary | ICD-10-CM

## 2022-10-26 DIAGNOSIS — I429 Cardiomyopathy, unspecified: Secondary | ICD-10-CM | POA: Diagnosis not present

## 2022-10-27 ENCOUNTER — Telehealth: Payer: Self-pay | Admitting: Internal Medicine

## 2022-10-27 LAB — LIPID PANEL
Chol/HDL Ratio: 1.7 ratio (ref 0.0–4.4)
Cholesterol, Total: 147 mg/dL (ref 100–199)
HDL: 89 mg/dL (ref >39)
LDL Chol Calc (NIH): 44 mg/dL (ref 0–99)
Triglycerides: 75 mg/dL (ref 0–149)
VLDL Cholesterol Cal: 14 mg/dL (ref 5–40)

## 2022-10-27 NOTE — Telephone Encounter (Signed)
Spoke to the patient, advised her Dr. Lynnette Caffey has not reviewed her lab results. The office will call her with MD recommendations and will mail her a copy of test results. Patient voiced understanding.

## 2022-10-27 NOTE — Telephone Encounter (Signed)
Follow Up:     Patient says she would like her lab results from yesterday. She said she is not on My Chart. Marland Kitchen

## 2022-10-28 ENCOUNTER — Telehealth: Payer: Self-pay | Admitting: Internal Medicine

## 2022-10-28 DIAGNOSIS — Z79899 Other long term (current) drug therapy: Secondary | ICD-10-CM

## 2022-10-28 DIAGNOSIS — I10 Essential (primary) hypertension: Secondary | ICD-10-CM

## 2022-10-28 NOTE — Telephone Encounter (Signed)
Spoke with patient and discussed lab results.  Per Dr. Lynnette Caffey: Her LDL is at goal of <55.  Continue Crestor at current dose.   Patient verbalized understanding and shared some BP/HR readings since medication changes made at last OV on 10/17/22.  All PM readings before taking BP meds. 5/30 PM--167/118, 68 5/30 noon--150/97, 80 5/30 AM--155/86, 75  5/29 PM--164/97, 76 5/29 AM--142/88, 79  5/28 PM--177/103, 75 5/28 noon--179/113, 78 5/28 AM--127/87, 75  5/27 PM--154/104, 74 5/27 noon--143/90, 80 5/27 AM--135/90, 87  Will forward to Dr. Lynnette Caffey to review.

## 2022-10-31 MED ORDER — LOSARTAN POTASSIUM 50 MG PO TABS: 50.0000 mg | ORAL_TABLET | Freq: Every day | ORAL | 3 refills | Status: DC

## 2022-10-31 NOTE — Telephone Encounter (Signed)
Spoke with patient and discussed Dr. Trula Ore recommendation:  Increase losartan to 50mg  and BMP in one week, thanks   Patient reports BP yesterday evening: 179/116, HR 67.  Patient currently taking Losartan 12.5mg  QHS. She is hesitant about immediately increasing to 50mg . Instructed patient to increase Losartan to 25mg  for 3 days and then increase to 50mg  QD. Continue to monitor BP. BMET ordered and lab appt scheduled for 11/08/22.  Patient verbalized understanding and expressed appreciation for follow-up.  Losartan 50mg  QHS sent to pharmacy of choice.

## 2022-11-02 ENCOUNTER — Other Ambulatory Visit: Payer: Self-pay

## 2022-11-02 ENCOUNTER — Telehealth: Payer: Self-pay

## 2022-11-02 DIAGNOSIS — D472 Monoclonal gammopathy: Secondary | ICD-10-CM

## 2022-11-02 LAB — PROTEIN ELECTROPHORESIS, SERUM, WITH REFLEX
A/G Ratio: 1.6 (ref 0.7–1.7)
Albumin ELP: 3.9 g/dL (ref 2.9–4.4)
Alpha-1-Globulin: 0.2 g/dL (ref 0.0–0.4)
Alpha-2-Globulin: 0.6 g/dL (ref 0.4–1.0)
Beta Globulin: 0.9 g/dL (ref 0.7–1.3)
Gamma Globulin: 0.7 g/dL (ref 0.4–1.8)
Globulin, Total: 2.4 g/dL (ref 2.2–3.9)
M-Spike, %: 0.2 g/dL — ABNORMAL HIGH
SPEP Interpretation: 0
Total Protein ELP: 6.3 g/dL (ref 6.0–8.5)

## 2022-11-02 LAB — IMMUNOFIXATION REFLEX, SERUM

## 2022-11-02 NOTE — Telephone Encounter (Signed)
Called and informed pt of lab results, she was appreciative of call and declines any questions or concerns at this time.

## 2022-11-02 NOTE — Telephone Encounter (Signed)
-----   Message from Josph Macho, MD sent at 11/02/2022 12:16 PM EDT ----- Please call and let her know that the protein spike is still stable at 0.2.  This is fantastic.  Thanks.  Cindee Lame

## 2022-11-03 NOTE — Progress Notes (Signed)
[facility] Summary Report / Loop Recorder 

## 2022-11-08 ENCOUNTER — Ambulatory Visit: Payer: Medicare Other

## 2022-11-09 ENCOUNTER — Ambulatory Visit: Payer: Medicare Other | Attending: Internal Medicine

## 2022-11-09 DIAGNOSIS — I10 Essential (primary) hypertension: Secondary | ICD-10-CM | POA: Diagnosis not present

## 2022-11-09 DIAGNOSIS — Z79899 Other long term (current) drug therapy: Secondary | ICD-10-CM

## 2022-11-09 DIAGNOSIS — E039 Hypothyroidism, unspecified: Secondary | ICD-10-CM | POA: Diagnosis not present

## 2022-11-09 DIAGNOSIS — E785 Hyperlipidemia, unspecified: Secondary | ICD-10-CM | POA: Diagnosis not present

## 2022-11-09 DIAGNOSIS — E663 Overweight: Secondary | ICD-10-CM | POA: Diagnosis not present

## 2022-11-09 LAB — CUP PACEART REMOTE DEVICE CHECK
Date Time Interrogation Session: 20240610230754
Implantable Pulse Generator Implant Date: 20230407

## 2022-11-10 LAB — BASIC METABOLIC PANEL
BUN/Creatinine Ratio: 14 (ref 12–28)
BUN: 13 mg/dL (ref 8–27)
CO2: 25 mmol/L (ref 20–29)
Calcium: 9.9 mg/dL (ref 8.7–10.3)
Chloride: 97 mmol/L (ref 96–106)
Creatinine, Ser: 0.91 mg/dL (ref 0.57–1.00)
Glucose: 146 mg/dL — ABNORMAL HIGH (ref 70–99)
Potassium: 4.3 mmol/L (ref 3.5–5.2)
Sodium: 136 mmol/L (ref 134–144)
eGFR: 61 mL/min/{1.73_m2} (ref >59)

## 2022-11-14 ENCOUNTER — Ambulatory Visit (INDEPENDENT_AMBULATORY_CARE_PROVIDER_SITE_OTHER): Payer: Medicare Other

## 2022-11-14 DIAGNOSIS — I639 Cerebral infarction, unspecified: Secondary | ICD-10-CM | POA: Diagnosis not present

## 2022-11-24 DIAGNOSIS — M5416 Radiculopathy, lumbar region: Secondary | ICD-10-CM | POA: Diagnosis not present

## 2022-11-24 DIAGNOSIS — M5116 Intervertebral disc disorders with radiculopathy, lumbar region: Secondary | ICD-10-CM | POA: Diagnosis not present

## 2022-12-06 NOTE — Progress Notes (Signed)
[facility] Summary Report / Loop Recorder 

## 2022-12-08 DIAGNOSIS — Z85828 Personal history of other malignant neoplasm of skin: Secondary | ICD-10-CM | POA: Diagnosis not present

## 2022-12-08 DIAGNOSIS — D692 Other nonthrombocytopenic purpura: Secondary | ICD-10-CM | POA: Diagnosis not present

## 2022-12-08 DIAGNOSIS — L821 Other seborrheic keratosis: Secondary | ICD-10-CM | POA: Diagnosis not present

## 2022-12-08 DIAGNOSIS — L570 Actinic keratosis: Secondary | ICD-10-CM | POA: Diagnosis not present

## 2022-12-12 ENCOUNTER — Ambulatory Visit: Payer: Medicare Other

## 2022-12-12 ENCOUNTER — Ambulatory Visit (INDEPENDENT_AMBULATORY_CARE_PROVIDER_SITE_OTHER): Payer: Medicare Other

## 2022-12-12 DIAGNOSIS — I639 Cerebral infarction, unspecified: Secondary | ICD-10-CM

## 2022-12-14 DIAGNOSIS — Z961 Presence of intraocular lens: Secondary | ICD-10-CM | POA: Diagnosis not present

## 2022-12-14 DIAGNOSIS — H26491 Other secondary cataract, right eye: Secondary | ICD-10-CM | POA: Diagnosis not present

## 2022-12-14 LAB — CUP PACEART REMOTE DEVICE CHECK
Date Time Interrogation Session: 20240713230520
Implantable Pulse Generator Implant Date: 20230407

## 2022-12-19 ENCOUNTER — Ambulatory Visit: Payer: Medicare Other

## 2022-12-28 ENCOUNTER — Ambulatory Visit: Payer: Medicare Other | Admitting: Cardiology

## 2022-12-28 ENCOUNTER — Encounter: Payer: Self-pay | Admitting: Cardiology

## 2022-12-28 VITALS — BP 126/84 | HR 84 | Ht 67.0 in | Wt 138.0 lb

## 2022-12-28 DIAGNOSIS — I493 Ventricular premature depolarization: Secondary | ICD-10-CM | POA: Insufficient documentation

## 2022-12-28 DIAGNOSIS — I639 Cerebral infarction, unspecified: Secondary | ICD-10-CM | POA: Diagnosis not present

## 2022-12-28 DIAGNOSIS — I5022 Chronic systolic (congestive) heart failure: Secondary | ICD-10-CM | POA: Insufficient documentation

## 2022-12-28 MED ORDER — METOPROLOL SUCCINATE ER 50 MG PO TB24: 75.0000 mg | ORAL_TABLET | Freq: Every day | ORAL | 3 refills | Status: AC

## 2022-12-28 NOTE — Patient Instructions (Signed)
Medication Instructions:  Your physician has recommended you make the following change in your medication:  1) INCREASE metoprolol succinate to 75mg  (1.5 tablets) once daily *If you need a refill on your cardiac medications before your next appointment, please call your pharmacy*  Follow-Up: At [facility], you and your health needs are our priority.  As part of our continuing mission to provide you with exceptional heart care, we have created designated Provider Care Teams.  These Care Teams include your primary Cardiologist (physician) and Advanced Practice Providers (APPs -  Physician Assistants and Nurse Practitioners) who all work together to provide you with the care you need, when you need it.  Your next appointment:   6 months  Provider:   You will see one of the following Advanced Practice Providers on your designated Care Team:   Francis Dowse, Charlott Holler [address]" [place], [place] Sherie Don, NP Canary Brim, NP

## 2022-12-28 NOTE — Progress Notes (Signed)
  Electrophysiology Office Follow up Visit Note:    Date:  12/28/2022   ID:  [name], DOB 07/29/1933, MRN [id number]  PCP:  Geoffry Paradise, MD  [facility] Cardiologist:  Lesleigh Noe, MD (Inactive)  [facility] Electrophysiologist:  Lanier Prude, MD    Interval History:    [name] is a 89 y.o. female who presents for a follow up visit.   Last seen by Tereso Newcomer on September 05, 2022.  She has a history of stroke.  She has a loop recorder in place.  I also treat her for PVCs.  A recent echocardiogram showed a reduced left ventricular function, 42%.  Given her frequent PVCs suppression of her PVCs was undertaken with amiodarone.  She presents today for follow-up.  After she started taking amiodarone she reported a rash on her torso.  The amiodarone was stopped.  Her rash resolved after stopping the amiodarone.  She is fallen multiple times.  She is with her family today in clinic who I previously met.  She is more confused during our interview than she has been during previous visits.     Past medical, surgical, social and family history were reviewed.  ROS:   Please see the history of present illness.    All other systems reviewed and are negative.  EKGs/Labs/Other Studies Reviewed:    The following studies were reviewed today:  Loop recorder interrogations  EKG Interpretation Date/Time:  Wednesday December 28 2022 14:47:32 EDT Ventricular Rate:  84 PR Interval:  168 QRS Duration:  76 QT Interval:  390 QTC Calculation: 460 R Axis:   -29  Text Interpretation: Sinus rhythm with frequent Premature ventricular complexes Confirmed by Steffanie Dunn [phone]) on 12/28/2022 3:16:33 PM    Physical Exam:    VS:  BP 126/84   Pulse 84   Ht 5\' 7"  (1.702 m)   Wt 138 lb (62.6 kg)   SpO2 96%   BMI 21.61 kg/m     Wt Readings from Last 3 Encounters:  12/28/22 138 lb (62.6 kg)  10/21/22 139 lb 1.9 oz (63.1 kg)  10/17/22 138 lb 12.8 oz (63 kg)      GEN:  Well nourished, well developed in no acute distress.  Elderly CARDIAC: RRR, no murmurs, rubs, gallops RESPIRATORY:  Clear to auscultation without rales, wheezing or rhonchi       ASSESSMENT:    1. Cryptogenic stroke (HCC)   2. PVC's (premature ventricular contractions)   3. Chronic systolic heart failure (HCC)    PLAN:    In order of problems listed above:  #Frequent PVCs #Chronic systolic heart failure  NYHA class II.  Warm and dry on exam.  Continue current medical therapy including metoprolol, losartan  We discussed treatment options for her frequent PVCs including alternative antiarrhythmic drugs and conservative medical therapy/management.  Given her advanced age, I do think a more conservative approach is indicated.    For now I am going to increase her metoprolol to 75 mg by mouth once daily  #Cryptogenic stroke Continue Plavix  She will follow-up in 6 months with an APP this will  Signed, Steffanie Dunn, MD, [facility], [facility] 12/28/2022 3:43 PM    Electrophysiology [facility]

## 2022-12-28 NOTE — Progress Notes (Signed)
[facility] Summary Report / Loop Recorder 

## 2023-01-02 ENCOUNTER — Telehealth: Payer: Self-pay | Admitting: Neurology

## 2023-01-02 NOTE — Telephone Encounter (Signed)
Pt has called to report that on last Sunday evening she fell on her back.  Pt hit her head, back, and across neck.  Pt states her family wanted her to go to ED but she did not.  Pt states her PCP Dr Barnett Abu put an artificial disk in her back.  Pt would like to discuss this with Maralyn Sago, NP.  Please call pt to discuss the fall.

## 2023-01-02 NOTE — Telephone Encounter (Signed)
Returned call to pt and advised her to go to er she voiced gratitude and understanding

## 2023-01-02 NOTE — Telephone Encounter (Signed)
Returned call to patient, she states she fell on Sunday 12/25/22 had a head strike, and hurt her lower back and her neck. She states her family encouraged her to go to the Er but she refused. She states she had a stroke 2 years ago and has unsteady gait since but does use assistive devices. She has had headaches for about a week and tylenol has helped. No current symptoms other than pain in lower back. She has had 4 other falls since fall on Sunday. Advised that Maralyn Sago was out of the office but I would send to covering MD as well but encouraged patient to follow up with PCP, urgent care or ER for evaluation due to previous stroke and multiple falls in 1 week. Reviewed stroke symptoms with patient. Patient verbalized understanding.

## 2023-01-02 NOTE — Telephone Encounter (Signed)
I would agree, patient should be seen in the ER for imaging to rule out acute injury from the falls. Thanks.

## 2023-01-03 ENCOUNTER — Emergency Department (HOSPITAL_BASED_OUTPATIENT_CLINIC_OR_DEPARTMENT_OTHER): Payer: Medicare Other

## 2023-01-03 ENCOUNTER — Emergency Department (HOSPITAL_BASED_OUTPATIENT_CLINIC_OR_DEPARTMENT_OTHER)
Admission: EM | Admit: 2023-01-03 | Discharge: 2023-01-03 | Disposition: A | Discharge status: Home / Self Care | Payer: Medicare Other | Attending: Emergency Medicine | Admitting: Emergency Medicine

## 2023-01-03 ENCOUNTER — Other Ambulatory Visit: Payer: Self-pay

## 2023-01-03 ENCOUNTER — Encounter (HOSPITAL_BASED_OUTPATIENT_CLINIC_OR_DEPARTMENT_OTHER): Payer: Self-pay | Admitting: Emergency Medicine

## 2023-01-03 DIAGNOSIS — R519 Headache, unspecified: Secondary | ICD-10-CM | POA: Diagnosis not present

## 2023-01-03 DIAGNOSIS — Z8673 Personal history of transient ischemic attack (TIA), and cerebral infarction without residual deficits: Secondary | ICD-10-CM | POA: Diagnosis not present

## 2023-01-03 DIAGNOSIS — M542 Cervicalgia: Secondary | ICD-10-CM | POA: Insufficient documentation

## 2023-01-03 DIAGNOSIS — M25561 Pain in right knee: Secondary | ICD-10-CM | POA: Insufficient documentation

## 2023-01-03 DIAGNOSIS — R531 Weakness: Secondary | ICD-10-CM | POA: Insufficient documentation

## 2023-01-03 DIAGNOSIS — Z471 Aftercare following joint replacement surgery: Secondary | ICD-10-CM | POA: Diagnosis not present

## 2023-01-03 DIAGNOSIS — Z043 Encounter for examination and observation following other accident: Secondary | ICD-10-CM | POA: Diagnosis not present

## 2023-01-03 DIAGNOSIS — W01190A Fall on same level from slipping, tripping and stumbling with subsequent striking against furniture, initial encounter: Secondary | ICD-10-CM | POA: Insufficient documentation

## 2023-01-03 DIAGNOSIS — M25562 Pain in left knee: Secondary | ICD-10-CM | POA: Insufficient documentation

## 2023-01-03 DIAGNOSIS — S20211A Contusion of right front wall of thorax, initial encounter: Secondary | ICD-10-CM | POA: Diagnosis not present

## 2023-01-03 DIAGNOSIS — S20219A Contusion of unspecified front wall of thorax, initial encounter: Secondary | ICD-10-CM

## 2023-01-03 DIAGNOSIS — Z981 Arthrodesis status: Secondary | ICD-10-CM | POA: Diagnosis not present

## 2023-01-03 DIAGNOSIS — S20229A Contusion of unspecified back wall of thorax, initial encounter: Secondary | ICD-10-CM | POA: Insufficient documentation

## 2023-01-03 DIAGNOSIS — M25551 Pain in right hip: Secondary | ICD-10-CM | POA: Diagnosis not present

## 2023-01-03 DIAGNOSIS — S3993XA Unspecified injury of pelvis, initial encounter: Secondary | ICD-10-CM | POA: Diagnosis not present

## 2023-01-03 DIAGNOSIS — M546 Pain in thoracic spine: Secondary | ICD-10-CM | POA: Diagnosis present

## 2023-01-03 DIAGNOSIS — Z96652 Presence of left artificial knee joint: Secondary | ICD-10-CM | POA: Diagnosis not present

## 2023-01-03 DIAGNOSIS — Z9049 Acquired absence of other specified parts of digestive tract: Secondary | ICD-10-CM | POA: Diagnosis not present

## 2023-01-03 DIAGNOSIS — S0990XA Unspecified injury of head, initial encounter: Secondary | ICD-10-CM | POA: Diagnosis not present

## 2023-01-03 DIAGNOSIS — Z7902 Long term (current) use of antithrombotics/antiplatelets: Secondary | ICD-10-CM | POA: Insufficient documentation

## 2023-01-03 DIAGNOSIS — M4856XA Collapsed vertebra, not elsewhere classified, lumbar region, initial encounter for fracture: Secondary | ICD-10-CM | POA: Diagnosis not present

## 2023-01-03 DIAGNOSIS — S300XXA Contusion of lower back and pelvis, initial encounter: Secondary | ICD-10-CM | POA: Diagnosis not present

## 2023-01-03 DIAGNOSIS — Z96651 Presence of right artificial knee joint: Secondary | ICD-10-CM | POA: Diagnosis not present

## 2023-01-03 DIAGNOSIS — M5134 Other intervertebral disc degeneration, thoracic region: Secondary | ICD-10-CM | POA: Diagnosis not present

## 2023-01-03 DIAGNOSIS — S3991XA Unspecified injury of abdomen, initial encounter: Secondary | ICD-10-CM | POA: Diagnosis not present

## 2023-01-03 DIAGNOSIS — R296 Repeated falls: Secondary | ICD-10-CM | POA: Insufficient documentation

## 2023-01-03 DIAGNOSIS — S199XXA Unspecified injury of neck, initial encounter: Secondary | ICD-10-CM | POA: Diagnosis not present

## 2023-01-03 DIAGNOSIS — R0789 Other chest pain: Secondary | ICD-10-CM | POA: Diagnosis not present

## 2023-01-03 DIAGNOSIS — S299XXA Unspecified injury of thorax, initial encounter: Secondary | ICD-10-CM | POA: Diagnosis not present

## 2023-01-03 DIAGNOSIS — M4854XA Collapsed vertebra, not elsewhere classified, thoracic region, initial encounter for fracture: Secondary | ICD-10-CM | POA: Diagnosis not present

## 2023-01-03 LAB — COMPREHENSIVE METABOLIC PANEL
ALT: 25 U/L (ref 0–44)
AST: 25 U/L (ref 15–41)
Albumin: 3.9 g/dL (ref 3.5–5.0)
Alkaline Phosphatase: 52 U/L (ref 38–126)
Anion gap: 10 (ref 5–15)
BUN: 11 mg/dL (ref 8–23)
CO2: 27 mmol/L (ref 22–32)
Calcium: 9.5 mg/dL (ref 8.9–10.3)
Chloride: 98 mmol/L (ref 98–111)
Creatinine, Ser: 0.76 mg/dL (ref 0.44–1.00)
GFR, Estimated: >60 mL/min (ref >60)
Glucose, Bld: 142 mg/dL — ABNORMAL HIGH (ref 70–99)
Potassium: 3.4 mmol/L — ABNORMAL LOW (ref 3.5–5.1)
Sodium: 135 mmol/L (ref 135–145)
Total Bilirubin: 0.6 mg/dL (ref 0.3–1.2)
Total Protein: 6.7 g/dL (ref 6.5–8.1)

## 2023-01-03 LAB — URINALYSIS, ROUTINE W REFLEX MICROSCOPIC
Bilirubin Urine: NEGATIVE
Glucose, UA: NEGATIVE mg/dL
Hgb urine dipstick: NEGATIVE
Ketones, ur: NEGATIVE mg/dL
Nitrite: NEGATIVE
Protein, ur: NEGATIVE mg/dL
Specific Gravity, Urine: 1.02 (ref 1.005–1.030)
pH: 6 (ref 5.0–8.0)

## 2023-01-03 LAB — CBC WITH DIFFERENTIAL/PLATELET
Abs Immature Granulocytes: 0.01 10*3/uL (ref 0.00–0.07)
Basophils Absolute: 0 10*3/uL (ref 0.0–0.1)
Basophils Relative: 0 %
Eosinophils Absolute: 0.1 10*3/uL (ref 0.0–0.5)
Eosinophils Relative: 1 %
HCT: 39.8 % (ref 36.0–46.0)
Hemoglobin: 13.5 g/dL (ref 12.0–15.0)
Immature Granulocytes: 0 %
Lymphocytes Relative: 24 %
Lymphs Abs: 1.1 10*3/uL (ref 0.7–4.0)
MCH: 32.7 pg (ref 26.0–34.0)
MCHC: 33.9 g/dL (ref 30.0–36.0)
MCV: 96.4 fL (ref 80.0–100.0)
Monocytes Absolute: 0.5 10*3/uL (ref 0.1–1.0)
Monocytes Relative: 12 %
Neutro Abs: 2.9 10*3/uL (ref 1.7–7.7)
Neutrophils Relative %: 63 %
Platelets: 249 10*3/uL (ref 150–400)
RBC: 4.13 MIL/uL (ref 3.87–5.11)
RDW: 12.8 % (ref 11.5–15.5)
WBC: 4.6 10*3/uL (ref 4.0–10.5)
nRBC: 0 % (ref 0.0–0.2)

## 2023-01-03 LAB — URINALYSIS, MICROSCOPIC (REFLEX)

## 2023-01-03 MED ORDER — HYDROCODONE-ACETAMINOPHEN 5-325 MG PO TABS
0.5000 | ORAL_TABLET | Freq: Once | ORAL | Status: AC
  Administered 2023-01-03: 0.5 via ORAL
  Filled 2023-01-03: qty 1

## 2023-01-03 MED ORDER — IOHEXOL 300 MG/ML  SOLN
100.0000 mL | Freq: Once | INTRAMUSCULAR | Status: AC | PRN
  Administered 2023-01-03: 100 mL via INTRAVENOUS

## 2023-01-03 MED ORDER — HYDROCODONE-ACETAMINOPHEN 5-325 MG PO TABS: 1.0000 | ORAL_TABLET | Freq: Every evening | ORAL | 0 refills | Status: DC | PRN

## 2023-01-03 MED ORDER — ACETAMINOPHEN 325 MG PO TABS
650.0000 mg | ORAL_TABLET | Freq: Once | ORAL | Status: AC
  Administered 2023-01-03: 650 mg via ORAL
  Filled 2023-01-03: qty 2

## 2023-01-03 NOTE — ED Notes (Signed)
Patient transported to CT 

## 2023-01-03 NOTE — Discharge Instructions (Addendum)
The cause of your fall, is unclear, you may have a worsening weakness secondary to age, please follow-up with physical therapy, I have sent a home eval for further evaluation.  Additionally use the Tylenol, for your pain, as well as the Norco that I prescribed.  Only take the Norco at night, to prevent falls, and make sure you are taking it with MiraLAX.  Return to the ER if you have any weakness, numbness of bilateral lower extremities, or loss of bowel or bladder, severe headache, or confusion.  Please follow-up with your cardiologist, given your ectopy on your EKG.

## 2023-01-03 NOTE — ED Provider Notes (Signed)
EMERGENCY DEPARTMENT AT [facility] Provider Note   CSN: [id number] Arrival date & time: 01/03/23  1110     History  Chief Complaint  Patient presents with   Fall   Weakness    [name] is a 89 y.o. female, history of CVA, who presents to the ED secondary to multiple falls in the last week.  She states that she fell backwards, and hit her head, on the dresser about a week ago, and had a severe headache, for the past week.  Notes that she has mild, more times, and just feels off balance.  She denies any chest pain, dizziness, lightheadedness, abdominal pain, shortness of breath with these falls.  She states that her headache has abated in the past week, but is very severe for the past week.  She notes that she has taken Tylenol with some relief.  Reports that her neck, and upper back really bother her as well as she has been having pain in her legs, when standing.  She has been using her cane, as usual, but has been using needing to rely on it more.  Son states that she lives alone, and has no history of memory problems.    Home Medications Prior to Admission medications   Medication Sig Start Date End Date Taking? Authorizing Provider  HYDROcodone-acetaminophen (NORCO/VICODIN) 5-325 MG tablet Take 1 tablet by mouth at bedtime as needed. 01/03/23  Yes ,  L, PA  ALPRAZolam (XANAX) 0.5 MG tablet Take 0.25 mg by mouth 2 (two) times daily as needed (restless legs).    [provider]  amoxicillin (AMOXIL) 500 MG capsule Take 2,000 mg by mouth as needed (dental). Take 4 capsules (2000 mg) by mouth one hour prior to dental appointments 03/29/21   [provider]  Ascorbic Acid (VITAMIN C PO) Take 1 tablet by mouth daily.    [provider]  Calcium Carbonate-Vitamin D (CALCIUM-D PO) Take 1 tablet by mouth daily.    [provider]  celecoxib (CELEBREX) 200 MG capsule Take 1 tablet by mouth as needed. 09/28/20   [provider]  clopidogrel (PLAVIX) 75 MG tablet Take 1 tablet (75 mg total) by mouth daily. 04/23/21   Albertine Grates, MD  dexlansoprazole (DEXILANT) 60 MG capsule Take 60 mg by mouth every morning.    [provider]  escitalopram (LEXAPRO) 10 MG tablet Take 10 mg by mouth at bedtime.    [provider]  levothyroxine (SYNTHROID, LEVOTHROID) 25 MCG tablet Take 25 mcg by mouth daily before breakfast.     [provider]  losartan (COZAAR) 50 MG tablet Take 1 tablet (50 mg total) by mouth daily. 10/31/22   Orbie Pyo, MD  metoprolol succinate (TOPROL-XL) 50 MG 24 hr tablet Take 1.5 tablets (75 mg total) by mouth daily. Take with or immediately following a meal. 12/28/22   Lanier Prude, MD  Multiple Vitamin (MULTIVITAMIN WITH MINERALS) TABS tablet Take 1 tablet by mouth daily.    [provider]  NONFORMULARY OR COMPOUNDED ITEM Apply 1 application  topically See admin instructions. Transdermal therapeutics  meloxicam 0.5%, doxepin 3%, amantadine 3%, dextromethorphan 2%, lidocaine 2%. - compounded at [facility]:  Apply topically to legs daily after showering for neuropathy    [provider]  pyridoxine (B-6) 500 MG tablet Take 1 tablet (500 mg total) by mouth daily. 02/24/22   Josph Macho, MD  rosuvastatin (CRESTOR) 20 MG tablet Take 1 tablet (  20 mg total) by mouth daily. 04/22/21   Albertine Grates, MD  Vitamin D, Ergocalciferol, (DRISDOL) 50000 UNITS CAPS Take 50,000 Units by mouth every Friday.    [provider]  VITAMIN E PO Take 1 capsule by mouth daily.    [provider]  Wheat Dextrin (BENEFIBER) POWD Take 1 daily dose 02/09/21   Tressia Danas, MD  zolpidem (AMBIEN CR) 12.5 MG CR tablet Take 12.5 mg by mouth at bedtime.    [provider]      Allergies    Patient has no known allergies.    Review of Systems   Review of Systems  Respiratory:  Negative for shortness of breath.   Cardiovascular:   Negative for chest pain.  Neurological:  Positive for weakness.    Physical Exam Updated Vital Signs BP (!) 171/99   Pulse 83   Temp 98 F (36.7 C)   Resp 20   Ht 5\' 1"  (1.549 m)   Wt 61.9 kg   SpO2 98%   BMI 25.78 kg/m  Physical Exam Vitals and nursing note reviewed.  Constitutional:      General: She is not in acute distress.    Appearance: She is well-developed.  HENT:     Head: Normocephalic and atraumatic.     Nose: Nose normal.     Mouth/Throat:     Mouth: Mucous membranes are moist.  Eyes:     Conjunctiva/sclera: Conjunctivae normal.  Cardiovascular:     Rate and Rhythm: Normal rate and regular rhythm.     Heart sounds: No murmur heard. Pulmonary:     Effort: Pulmonary effort is normal. No respiratory distress.     Breath sounds: Normal breath sounds.  Abdominal:     Palpations: Abdomen is soft.     Tenderness: There is no abdominal tenderness.  Musculoskeletal:        General: No swelling.     Cervical back: Neck supple.     Comments: Tenderness to palpation of the cervical and thoracic spine, as well as right hip, right femur, and bilateral knees.  Range of motion intact.  Skin:    General: Skin is warm and dry.     Capillary Refill: Capillary refill takes less than 2 seconds.     Comments: Large ecchymoses to right chest wall, and upper thoracic back.  Neurological:     General: No focal deficit present.     Mental Status: She is alert and oriented to person, place, and time.     Cranial Nerves: No cranial nerve deficit.     Sensory: No sensory deficit.  Psychiatric:        Mood and Affect: Mood normal.     ED Results / Procedures / Treatments   Labs (all labs ordered are listed, but only abnormal results are displayed) Labs Reviewed  COMPREHENSIVE METABOLIC PANEL - Abnormal; Notable for the following components:      Result Value   Potassium 3.4 (*)    Glucose, Bld 142 (*)    All other components within normal limits  URINALYSIS, ROUTINE W  REFLEX MICROSCOPIC - Abnormal; Notable for the following components:   Leukocytes,Ua TRACE (*)    All other components within normal limits  URINALYSIS, MICROSCOPIC (REFLEX) - Abnormal; Notable for the following components:   Bacteria, UA RARE (*)    All other components within normal limits  CBC WITH DIFFERENTIAL/PLATELET    EKG EKG Interpretation Date/Time:  Tuesday January 03 2023 11:28:17 EDT Ventricular Rate:  87 PR Interval:  177 QRS Duration:  86 QT Interval:  393 QTC Calculation: 473 R Axis:   -30  Text Interpretation: Sinus rhythm Ventricular premature complex Probable left atrial enlargement Abnormal R-wave progression, late transition Left ventricular hypertrophy No significant change since prior 7/24 Confirmed by Meridee Score [phone]) on 01/03/2023 11:41:50 AM  Radiology DG Knee Complete 4 Views Left  Result Date: 01/03/2023 CLINICAL DATA:  Bilateral knee pain after multiple falls. EXAM: LEFT KNEE - COMPLETE 4+ VIEW COMPARISON:  None Available. FINDINGS: Status post left total knee arthroplasty. Femoral and tibial components are well situated. No fracture or dislocation is noted. IMPRESSION: No acute abnormality seen. Electronically Signed   By: Lupita Raider M.D.   On: 01/03/2023 14:08   DG Femur Min 2 Views Right  Result Date: 01/03/2023 CLINICAL DATA:  Multiple falls. EXAM: RIGHT FEMUR 2 VIEWS COMPARISON:  None Available. FINDINGS: There is no evidence of fracture or other focal bone lesions. Status post right total knee arthroplasty. Soft tissues are unremarkable. IMPRESSION: No acute abnormality seen. Electronically Signed   By: Lupita Raider M.D.   On: 01/03/2023 14:07   DG Knee Complete 4 Views Right  Result Date: 01/03/2023 CLINICAL DATA:  Bilateral knee pain after multiple falls. EXAM: RIGHT KNEE - COMPLETE 4+ VIEW COMPARISON:  None Available. FINDINGS: Status post right total knee arthroplasty. Femoral and tibial components are well situated. No fracture or  dislocation is noted. IMPRESSION: No acute abnormality seen. Electronically Signed   By: Lupita Raider M.D.   On: 01/03/2023 14:05   CT T-SPINE NO CHARGE  Result Date: 01/03/2023 CLINICAL DATA:  Fall EXAM: CT THORACIC AND LUMBAR SPINE WITHOUT CONTRAST TECHNIQUE: Multidetector CT imaging of the thoracic and lumbar spine was performed without contrast. Multiplanar CT image reconstructions were also generated. RADIATION DOSE REDUCTION: This exam was performed according to the departmental dose-optimization program which includes automated exposure control, adjustment of the mA and/or kV according to patient size and/or use of iterative reconstruction technique. COMPARISON:  MRI lumbar spine 04/19/2021, x-ray thoracic spine 08/02/2021 FINDINGS: CT THORACIC SPINE FINDINGS Alignment: Normal. Vertebrae: Diffusely decreased bone density. Chronic compression fractures status post T7, T8, T11, T12 kyphoplasty. Multilevel mild degenerative changes of the spine. No acute fracture or focal pathologic process. Paraspinal and other soft tissues: Negative. Disc levels: Multilevel mild intervertebral disc space narrowing. CT LUMBAR SPINE FINDINGS Segmentation: 5 lumbar type vertebrae. Alignment: Grade 1 anterolisthesis of L3 on L4. Vertebrae: Diffusely decreased bone density. L4-L5 posterolateral and interbody surgical hardware fusion. No CT findings to suggest surgical hardware complication. Multilevel mild to moderate degenerative changes of the spine. No acute fracture or focal pathologic process. Paraspinal and other soft tissues: Negative. Disc levels: Multilevel intervertebral disc space vacuum phenomenon. IMPRESSION: CT THORACIC SPINE IMPRESSION 1. No acute displaced fracture or traumatic listhesis of the thoracic spine. 2. Chronic compression fractures status post T7, T8, T11, T12 kyphoplasty. CT LUMBAR SPINE IMPRESSION 1. No acute displaced fracture or traumatic listhesis of the lumbar spine. 2.  L4-L5 posterolateral  and interbody surgical hardware fusion. Electronically Signed   By: Tish Frederickson M.D.   On: 01/03/2023 13:29   CT L-SPINE NO CHARGE  Result Date: 01/03/2023 CLINICAL DATA:  Fall EXAM: CT THORACIC AND LUMBAR SPINE WITHOUT CONTRAST TECHNIQUE: Multidetector CT imaging of the thoracic and lumbar spine was performed without contrast. Multiplanar CT image reconstructions were also generated. RADIATION DOSE REDUCTION: This exam was performed according to the departmental dose-optimization program which includes automated  exposure control, adjustment of the mA and/or kV according to patient size and/or use of iterative reconstruction technique. COMPARISON:  MRI lumbar spine 04/19/2021, x-ray thoracic spine 08/02/2021 FINDINGS: CT THORACIC SPINE FINDINGS Alignment: Normal. Vertebrae: Diffusely decreased bone density. Chronic compression fractures status post T7, T8, T11, T12 kyphoplasty. Multilevel mild degenerative changes of the spine. No acute fracture or focal pathologic process. Paraspinal and other soft tissues: Negative. Disc levels: Multilevel mild intervertebral disc space narrowing. CT LUMBAR SPINE FINDINGS Segmentation: 5 lumbar type vertebrae. Alignment: Grade 1 anterolisthesis of L3 on L4. Vertebrae: Diffusely decreased bone density. L4-L5 posterolateral and interbody surgical hardware fusion. No CT findings to suggest surgical hardware complication. Multilevel mild to moderate degenerative changes of the spine. No acute fracture or focal pathologic process. Paraspinal and other soft tissues: Negative. Disc levels: Multilevel intervertebral disc space vacuum phenomenon. IMPRESSION: CT THORACIC SPINE IMPRESSION 1. No acute displaced fracture or traumatic listhesis of the thoracic spine. 2. Chronic compression fractures status post T7, T8, T11, T12 kyphoplasty. CT LUMBAR SPINE IMPRESSION 1. No acute displaced fracture or traumatic listhesis of the lumbar spine. 2.  L4-L5 posterolateral and interbody surgical  hardware fusion. Electronically Signed   By: Tish Frederickson M.D.   On: 01/03/2023 13:29   CT CHEST ABDOMEN PELVIS W CONTRAST  Result Date: 01/03/2023 CLINICAL DATA:  Polytrauma, blunt.  Fall EXAM: CT CHEST, ABDOMEN, AND PELVIS WITH CONTRAST TECHNIQUE: Multidetector CT imaging of the chest, abdomen and pelvis was performed following the standard protocol during bolus administration of intravenous contrast. RADIATION DOSE REDUCTION: This exam was performed according to the departmental dose-optimization program which includes automated exposure control, adjustment of the mA and/or kV according to patient size and/or use of iterative reconstruction technique. CONTRAST:  OMNIPAQUE IOHEXOL 300 MG/ML  SOLN COMPARISON:  None Available. FINDINGS: CHEST: Cardiovascular: No aortic injury. The thoracic aorta is normal in caliber. The heart is normal in size. No significant pericardial effusion. At least 2 vessel coronary artery calcification. Moderate atherosclerotic plaque. The main pulmonary artery is normal in caliber. No pulmonary embolus. Mediastinum/Nodes: No pneumomediastinum. No mediastinal hematoma. The esophagus is unremarkable. Large hiatal hernia containing the entire stomach. The thyroid is unremarkable. The central airways are patent. No mediastinal, hilar, or axillary lymphadenopathy. Lungs/Pleura: Biapical pleural/pulmonary scarring. No focal consolidation. No pulmonary nodule. No pulmonary mass. No pulmonary contusion or laceration. No pneumatocele formation. No pleural effusion. No pneumothorax. No hemothorax. Musculoskeletal/Chest wall: No chest wall mass. No acute rib or sternal fracture. Total right shoulder arthroplasty. Developed left humeral head avascular necrosis. Old healed left shoulder fracture. Please see separately dictated CT thoracolumbar spine. ABDOMEN / PELVIS: Hepatobiliary: Not enlarged. No focal lesion. No laceration or subcapsular hematoma. Status post cholecystectomy. Common  bile duct dilatation which can be seen in the post cholecystectomy setting. Mild intrahepatic biliary ductal dilatation centrally which can also be seen in the post cholecystectomy setting. Pancreas: Diffusely atrophic. Normal pancreatic contour. No main pancreatic duct dilatation. Spleen: Not enlarged. No focal lesion. No laceration, subcapsular hematoma, or vascular injury. Adrenals/Urinary Tract: No nodularity bilaterally. Bilateral kidneys enhance symmetrically. No hydronephrosis. No contusion, laceration, or subcapsular hematoma. No injury to the vascular structures or collecting systems. No hydroureter. The urinary bladder is unremarkable. Stomach/Bowel: No  or large bowel wall thickening or dilatation. Colonic diverticulosis. Status post appendectomy. Vasculature/Lymphatics: Severe atherosclerotic plaque. No abdominal aorta or iliac aneurysm. No active contrast extravasation or pseudoaneurysm. No abdominal, pelvic, inguinal lymphadenopathy. Reproductive: Status post hysterectomy. Other: No simple free fluid ascites. No pneumoperitoneum. No  hemoperitoneum. No mesenteric hematoma identified. No organized fluid collection. Musculoskeletal: No significant soft tissue hematoma. No acute pelvic fracture. Please see separately dictated CT thoracolumbar spine. Ports and Devices: None. IMPRESSION: 1. No acute intrathoracic, intra-abdominal, intrapelvic traumatic injury. 2. Please see separately dictated CT thoracolumbar spine 01/03/2023. 3. Other imaging findings of potential clinical significance: Aortic Atherosclerosis (ICD10-I70.0) -including at least 2 vessel coronary calcification. Large hiatal hernia containing the entire stomach with no associated volvulus. Colonic diverticulosis with no acute diverticulitis. Status post cholecystectomy, appendectomy, hysterectomy, total right shoulder arthroplasty. Developed left humeral head avascular necrosis. Electronically Signed   By: Tish Frederickson M.D.   On:  01/03/2023 13:23   CT Cervical Spine Wo Contrast  Result Date: 01/03/2023 CLINICAL DATA:  Polytrauma, blunt. Falling 5 times in last week. C/o worsening weakness, neurology encouraged to be evaluated. Hx stroke 2022. Takes plavix. Reports hitting her head in fall one week ago. EXAM: CT HEAD WITHOUT CONTRAST CT CERVICAL SPINE WITHOUT CONTRAST TECHNIQUE: Multidetector CT imaging of the head and cervical spine was performed following the standard protocol without intravenous contrast. Multiplanar CT image reconstructions of the cervical spine were also generated. RADIATION DOSE REDUCTION: This exam was performed according to the departmental dose-optimization program which includes automated exposure control, adjustment of the mA and/or kV according to patient size and/or use of iterative reconstruction technique. COMPARISON:  None Available. FINDINGS: CT HEAD FINDINGS Brain: Patchy and confluent areas of decreased attenuation are noted throughout the deep and periventricular white matter of the cerebral hemispheres bilaterally, compatible with chronic microvascular ischemic disease. No evidence of large-territorial acute infarction. No parenchymal hemorrhage. No mass lesion. No extra-axial collection. No mass effect or midline shift. No hydrocephalus. Basilar cisterns are patent. Vascular: No hyperdense vessel. Skull: No acute fracture or focal lesion. Bilateral temporomandibular joint degenerative changes. Sinuses/Orbits: Paranasal sinuses and mastoid air cells are clear. Bilateral lens replacement. Otherwise the orbits are unremarkable. Other: None. CT CERVICAL SPINE FINDINGS Alignment: Grade 1 anterolisthesis of C2 on C3, C3 on C4, C4 on C5. Skull base and vertebrae: C5-C6 and C6-C7 vertebral body surgical hardware fusion. No acute fracture. No aggressive appearing focal osseous lesion or focal pathologic process. Soft tissues and spinal canal: No prevertebral fluid or swelling. No visible canal hematoma. Upper  chest: Biapical pleural/pulmonary scarring. Other: None. IMPRESSION: 1. No acute intracranial abnormality. 2. No acute displaced fracture or traumatic listhesis of the cervical spine. Electronically Signed   By: Tish Frederickson M.D.   On: 01/03/2023 13:10   CT Head Wo Contrast  Result Date: 01/03/2023 CLINICAL DATA:  Polytrauma, blunt. Falling 5 times in last week. C/o worsening weakness, neurology encouraged to be evaluated. Hx stroke 2022. Takes plavix. Reports hitting her head in fall one week ago. EXAM: CT HEAD WITHOUT CONTRAST CT CERVICAL SPINE WITHOUT CONTRAST TECHNIQUE: Multidetector CT imaging of the head and cervical spine was performed following the standard protocol without intravenous contrast. Multiplanar CT image reconstructions of the cervical spine were also generated. RADIATION DOSE REDUCTION: This exam was performed according to the departmental dose-optimization program which includes automated exposure control, adjustment of the mA and/or kV according to patient size and/or use of iterative reconstruction technique. COMPARISON:  None Available. FINDINGS: CT HEAD FINDINGS Brain: Patchy and confluent areas of decreased attenuation are noted throughout the deep and periventricular white matter of the cerebral hemispheres bilaterally, compatible with chronic microvascular ischemic disease. No evidence of large-territorial acute infarction. No parenchymal hemorrhage. No mass lesion. No extra-axial collection. No mass effect or midline shift.  No hydrocephalus. Basilar cisterns are patent. Vascular: No hyperdense vessel. Skull: No acute fracture or focal lesion. Bilateral temporomandibular joint degenerative changes. Sinuses/Orbits: Paranasal sinuses and mastoid air cells are clear. Bilateral lens replacement. Otherwise the orbits are unremarkable. Other: None. CT CERVICAL SPINE FINDINGS Alignment: Grade 1 anterolisthesis of C2 on C3, C3 on C4, C4 on C5. Skull base and vertebrae: C5-C6 and C6-C7  vertebral body surgical hardware fusion. No acute fracture. No aggressive appearing focal osseous lesion or focal pathologic process. Soft tissues and spinal canal: No prevertebral fluid or swelling. No visible canal hematoma. Upper chest: Biapical pleural/pulmonary scarring. Other: None. IMPRESSION: 1. No acute intracranial abnormality. 2. No acute displaced fracture or traumatic listhesis of the cervical spine. Electronically Signed   By: Tish Frederickson M.D.   On: 01/03/2023 13:10    Procedures Procedures    Medications Ordered in ED Medications  HYDROcodone-acetaminophen (NORCO/VICODIN) 5-325 MG per tablet 0.5 tablet (has no administration in time range)  acetaminophen (TYLENOL) tablet 650 mg (650 mg Oral Given 01/03/23 1207)  iohexol (OMNIPAQUE) 300 MG/ML solution 100 mL (100 mLs Intravenous Contrast Given 01/03/23 1231)    ED Course/ Medical Decision Making/ A&P Clinical Course as of 01/03/23 1523  Tue Jan 03, 2023  1141 ECG not crossing into epic, sinus rhythm rate of 87 PVC no acute ST-T changes. [MB]  [id number] 89 year old female with multiple falls over the last few days, on Plavix.  She has some bruising to her anterior chest and upper back lower neck.  Diffuse pain.  Pending lab work CT imaging urinalysis.  Disposition per results of testing. [MB]    Clinical Course User Index [MB] Terrilee Files, MD                                 Medical Decision Making Patient is an 89 year old female, who fell about a week ago and has had recurrent falls since then.  She has ecchymosis to her chest wall, and upper thoracic spine.  She has tenderness to palpation of the left femur, as well as bilateral knees.  Has fallen on her knees, first episode was when she fell straight back.  Denies any headache, dizziness, weakness, that caused this.  States she does not know what happened.  We will obtain head CT, CTA of the chest, abdomen, pelvis, C-spine, thoracic spine, and lumbar for further evaluation  as well as x-rays of where she hurts.  She overall has fairly good equal strength, bilateral upper and lower extremities.  But given recurrent falls, will likely need physical therapy.  Denies any chest pain or shortness of breath.  Amount and/or Complexity of Data Reviewed Labs: ordered.    Details: No acute abnormalities of labs Radiology: ordered.    Details: CT head, cervical spine, chest, abdomen, pelvis, lumbar spine, thoracic spine, all within normal limits.  X-rays within normal limits.  Of note there is difficulty with having this passed over in PACS, but radiology graciously printed me out the readings ECG/medicine tests:  Decision-making details documented in ED Course. Discussion of management or test interpretation with external provider(s): Discussed with son, patient at bedside, she is overall feeling well, she has some pain to the back of the neck.  Is requesting Norco for pain, she only takes it half of it, at night, for pain control.  We will give her a few pills, for the pain given that she does have a large  bruise to the area.  She has no evidence of any kind of bleed, fractures, or organ damage.  Her frequent falls may be secondary to imbalance from the neuropathy.  I will have physical therapy see her at her home, for further evaluation, she needs strengthening, and balancing exercises.  I discussed this with Dr. Charm Barges, Plan.  Discharged diffuse pain.  Return precautions emphasized.  We discussed following up with cardiology, as she is already established with cardiologist, for her ectopy on her EKG. She already has a loop recorder.   Risk OTC drugs. Prescription drug management.   Final Clinical Impression(s) / ED Diagnoses Final diagnoses:  Weakness  Multiple falls  Contusion of chest wall, unspecified laterality, initial encounter  Contusion of back, unspecified laterality, initial encounter  Neck pain    Rx / DC Orders ED Discharge Orders          Ordered     Ambulatory referral to Home Health       Comments: Please evaluate EMMAKATE SIMEK for admission to [facility].  Disciplines requested: Physical Therapy  Services to provide: Strengthening Exercises  Physician to follow patient's care (the person listed here will be responsible for signing ongoing orders): PCP  Requested Start of Care Date: Within 2-3 days  I certify that this patient is under my care and that I, or a Nurse Practitioner or Physician's Assistant working with me, had a face-to-face encounter that meets the physician face-to-face requirements with patient on need for PT. The encounter with the patient was in whole, or in part for the following medical condition(s) which is the primary reason for home health care (List medical condition). Weakness, debility, frequent falls  Special Instructions:   01/03/23 1509    HYDROcodone-acetaminophen (NORCO/VICODIN) 5-325 MG tablet  At bedtime PRN        01/03/23 1512              , [place], PA 01/03/23 1523    Terrilee Files, MD 01/03/23 1731

## 2023-01-03 NOTE — ED Triage Notes (Signed)
Pt POV with son, reports falling 5 times in last week.  C/o worsening weakness, neurology encouraged to be evaluated.   Hx stroke 2022. Takes plavix.  Reports hitting her head in fall one week ago.   AOx4, VAN neg.

## 2023-01-13 LAB — CUP PACEART REMOTE DEVICE CHECK
Date Time Interrogation Session: 20240816094544
Implantable Pulse Generator Implant Date: 20230407

## 2023-01-16 ENCOUNTER — Ambulatory Visit (INDEPENDENT_AMBULATORY_CARE_PROVIDER_SITE_OTHER): Payer: Medicare Other

## 2023-01-16 DIAGNOSIS — I639 Cerebral infarction, unspecified: Secondary | ICD-10-CM

## 2023-01-18 ENCOUNTER — Ambulatory Visit: Payer: Medicare Other | Attending: Physician Assistant | Admitting: Physician Assistant

## 2023-01-18 ENCOUNTER — Encounter: Payer: Self-pay | Admitting: Physician Assistant

## 2023-01-18 ENCOUNTER — Ambulatory Visit: Payer: Medicare Other | Admitting: Physician Assistant

## 2023-01-18 ENCOUNTER — Ambulatory Visit (HOSPITAL_BASED_OUTPATIENT_CLINIC_OR_DEPARTMENT_OTHER): Payer: Medicare Other

## 2023-01-18 VITALS — BP 150/100 | HR 80 | Ht 61.0 in | Wt 134.4 lb

## 2023-01-18 DIAGNOSIS — R296 Repeated falls: Secondary | ICD-10-CM | POA: Insufficient documentation

## 2023-01-18 DIAGNOSIS — N182 Chronic kidney disease, stage 2 (mild): Secondary | ICD-10-CM | POA: Insufficient documentation

## 2023-01-18 DIAGNOSIS — I2510 Atherosclerotic heart disease of native coronary artery without angina pectoris: Secondary | ICD-10-CM | POA: Diagnosis not present

## 2023-01-18 DIAGNOSIS — E785 Hyperlipidemia, unspecified: Secondary | ICD-10-CM | POA: Diagnosis not present

## 2023-01-18 DIAGNOSIS — Z8673 Personal history of transient ischemic attack (TIA), and cerebral infarction without residual deficits: Secondary | ICD-10-CM | POA: Insufficient documentation

## 2023-01-18 DIAGNOSIS — I493 Ventricular premature depolarization: Secondary | ICD-10-CM | POA: Insufficient documentation

## 2023-01-18 DIAGNOSIS — I10 Essential (primary) hypertension: Secondary | ICD-10-CM

## 2023-01-18 DIAGNOSIS — I429 Cardiomyopathy, unspecified: Secondary | ICD-10-CM | POA: Diagnosis not present

## 2023-01-18 DIAGNOSIS — I5022 Chronic systolic (congestive) heart failure: Secondary | ICD-10-CM | POA: Insufficient documentation

## 2023-01-18 HISTORY — DX: Chronic systolic (congestive) heart failure: I50.22

## 2023-01-18 LAB — ECHOCARDIOGRAM COMPLETE
Area-P 1/2: 4.8 cm2
P 1/2 time: 532 msec
S' Lateral: 2.65 cm
Single Plane A4C EF: 52.1 %

## 2023-01-18 MED ORDER — SPIRONOLACTONE 25 MG PO TABS: 25.0000 mg | ORAL_TABLET | Freq: Every day | ORAL | 3 refills | Status: DC

## 2023-01-18 NOTE — Assessment & Plan Note (Signed)
History of nonobstructive coronary artery disease by CT in the past.  Nuclear stress test in 2016 was low risk.  She is not having chest pain to suggest angina. Continue Crestor 20 mg daily, Plavix 75 mg daily.

## 2023-01-18 NOTE — Patient Instructions (Addendum)
Medication Instructions:  Your physician has recommended you make the following change in your medication:   START Spironolactone 25 taking 1 daily  *If you need a refill on your cardiac medications before your next appointment, please call your pharmacy*   Lab Work: TODAY:  BMET & PRO BNP  01/26/23 COME BACK TO THE LAB, ANYTIME AFTER 7:15 A.M BEFORE 4:45 FOR:  BMET  02/02/23 COME BACK TO THE LAB, ANYTIME AFTER 7:15 A.M BEFORE 4:45 FOR:  BMET If you have labs (blood work) drawn today and your tests are completely normal, you will receive your results only by: [employer] (if you have MyChart) OR A paper copy in the mail If you have any lab test that is abnormal or we need to change your treatment, we will call you to review the results.   Testing/Procedures: None ordered   Follow-Up: At [facility], you and your health needs are our priority.  As part of our continuing mission to provide you with exceptional heart care, we have created designated Provider Care Teams.  These Care Teams include your primary Cardiologist (physician) and Advanced Practice Providers (APPs -  Physician Assistants and Nurse Practitioners) who all work together to provide you with the care you need, when you need it.  We recommend signing up for the patient portal called "MyChart".  Sign up information is provided on this After Visit Summary.  MyChart is used to connect with patients for Virtual Visits (Telemedicine).  Patients are able to view lab/test results, encounter notes, upcoming appointments, etc.  Non-urgent messages can be sent to your provider as well.   To learn more about what you can do with MyChart, go to [email or web address].    Your next appointment:   4 week(s)  02/20/2023 arrive at 2:00  Provider:   Tereso Newcomer, PA-C         Other Instructions Bring your blood pressure readings to your 1st lab appointment and drop them off for me.

## 2023-01-18 NOTE — Assessment & Plan Note (Signed)
Reduced LV function (EF 42%) on echo in May 2024.  Likely nonischemic and due to PVCs.  Amiodarone was discontinued due to rash.  Blood pressure is uncontrolled. -Add Spironolactone 25mg  daily to manage fluid retention and blood pressure. -Check BNP and basic metabolic panel today and repeat weekly x 2 to monitor response to Spironolactone. -Continue losartan 50 mg daily, Toprol-XL 75 mg daily -Follow-up 4 to 6 weeks

## 2023-01-18 NOTE — Assessment & Plan Note (Signed)
Previous monitor with 18% burden.  LV function decreased with EF 42.  She could not tolerate amiodarone secondary to rash.  Continue follow-up with EP as planned.  Continue conservative management with Toprol-XL 75 mg daily.

## 2023-01-18 NOTE — Assessment & Plan Note (Signed)
Multiple falls reported in the past week. No loss of consciousness reported. -Encourage use of [place] to prevent falls. -Adjust blood pressure medications cautiously to avoid hypotension and increased fall risk.

## 2023-01-18 NOTE — Assessment & Plan Note (Signed)
Blood pressure uncontrolled -Continue Losartan 50mg  and Metoprolol XL 75 mg daily   -Add Spironolactone 25mg  daily. -Monitor blood pressure at home and bring readings to next appointment.

## 2023-01-18 NOTE — Progress Notes (Signed)
Cardiology Office Note:    Date:  01/18/2023  ID:  [name], DOB 19-Feb-1934, MRN [id number] PCP: Geoffry Paradise, MD  [facility] Providers Cardiologist:  Orbie Pyo, MD Electrophysiologist:  Lanier Prude, MD       Patient Profile:      Non-obstructive coronary artery disease  CCTA 10/07/2006: CAC score 1; LAD < 30% soft plaque   Myoview 03/30/2015: EF 83, no ischemia; low risk  HFmrEF (heart failure with mildly reduced ejection fraction)  TTE 10/03/22: EF 42, global HK, normal RVSF, mild MR, ascending aorta dilation (38 mm), RAP 3 CVA in 11/22  TTE 04/21/2021: EF 60-65, no RWMA, Gr 1 DD, normal RVSF, trivial MR, trivial AI S/p ILR (Dr. Lalla Brothers)  PVCs Monitor 05/2021: PVCs 18%; no atrial fibrillation [place]den monitored by ILR Hypertension  Hyperlipidemia  Hyponatremia GERD  MGUS             Discussed the use of AI scribe software for clinical note transcription with the patient, who gave verbal consent to proceed.  History of Present Illness   [name], an 89 year old female who returns for follow-up of CHF, frequent PVCs. She reports multiple falls recently, which she attributes to a loss of balance and weakness in her legs. She denies any episodes of blacking out during these falls. She also reports feeling weak all over, especially after exertion, and has difficulty walking up stairs due to both shortness of breath and leg weakness. She denies any chest discomfort but reports back pain that started about three weeks after a fall. She is able to lay flat without difficulty breathing and denies any swelling in her legs. She also denies any bleeding, but notes that she bruises easily due to her Plavix medication. She has noticed an increase in abdominal girth, which she attributes to fluid retention. She denies any history of frequent bladder infections.  She has not had chest pain.  Blood pressures at home have been elevated.    ROS:  See HPI No melena,  hematochezia    Studies Reviewed:        Risk Assessment/Calculations:     HYPERTENSION CONTROL Vitals:   01/18/23 1416 01/18/23 1514  BP: (!) 180/90 (!) 150/100    The patient's blood pressure is elevated above target today.  In order to address the patient's elevated BP: A new medication was prescribed today.          Physical Exam:   VS:  BP (!) 150/100   Pulse 80   Ht 5\' 1"  (1.549 m)   Wt 134 lb 6.4 oz (61 kg)   SpO2 97%   BMI 25.39 kg/m    Wt Readings from Last 3 Encounters:  01/18/23 134 lb 6.4 oz (61 kg)  01/03/23 136 lb 7.4 oz (61.9 kg)  12/28/22 138 lb (62.6 kg)    Constitutional:      Appearance: Healthy appearance. Not in distress.  Neck:     Vascular: No JVR.  Pulmonary:     Breath sounds: No wheezing. Rales (faint bibasilar crackles ?rales) present.  Cardiovascular:     Normal rate. Regular rhythm.     Murmurs: There is no murmur.  Edema:    Peripheral edema absent.  Abdominal:     General: There is no distension.     Palpations: Abdomen is soft.  Skin:    Findings: Bruising present.        Assessment and Plan:  Heart failure with mildly reduced ejection  fraction (HFmrEF) (HCC) Reduced LV function (EF 42%) on echo in May 2024.  Likely nonischemic and due to PVCs.  Amiodarone was discontinued due to rash.  Blood pressure is uncontrolled. -Add Spironolactone 25mg  daily to manage fluid retention and blood pressure. -Check BNP and basic metabolic panel today and repeat weekly x 2 to monitor response to Spironolactone. -Continue losartan 50 mg daily, Toprol-XL 75 mg daily -Follow-up 4 to 6 weeks  Essential hypertension Blood pressure uncontrolled -Continue Losartan 50mg  and Metoprolol XL 75 mg daily   -Add Spironolactone 25mg  daily. -Monitor blood pressure at home and bring readings to next appointment.  History of CVA (cerebrovascular accident) Status post ILR.  No A-fib detected today.On Plavix for secondary prevention since 2022. Reports  easy bruising. -Contact neurology to discuss potential switch from Plavix to Aspirin.  Frequent falls Multiple falls reported in the past week. No loss of consciousness reported. -Encourage use of [place] to prevent falls. -Adjust blood pressure medications cautiously to avoid hypotension and increased fall risk.  PVC's (premature ventricular contractions) Previous monitor with 18% burden.  LV function decreased with EF 42.  She could not tolerate amiodarone secondary to rash.  Continue follow-up with EP as planned.  Continue conservative management with Toprol-XL 75 mg daily.  CAD (coronary artery disease) History of nonobstructive coronary artery disease by CT in the past.  Nuclear stress test in 2016 was low risk.  She is not having chest pain to suggest angina. Continue Crestor 20 mg daily, Plavix 75 mg daily.        Dispo:  Return in about 4 weeks (around 02/15/2023) for Routine Follow Up, w/ Tereso Newcomer, PA-C.  Signed, Tereso Newcomer, PA-C

## 2023-01-18 NOTE — Assessment & Plan Note (Signed)
Status post ILR.  No A-fib detected today.On Plavix for secondary prevention since 2022. Reports easy bruising. -Contact neurology to discuss potential switch from Plavix to Aspirin.

## 2023-01-19 ENCOUNTER — Telehealth: Payer: Self-pay | Admitting: Neurology

## 2023-01-19 ENCOUNTER — Telehealth: Payer: Self-pay | Admitting: *Deleted

## 2023-01-19 DIAGNOSIS — I5022 Chronic systolic (congestive) heart failure: Secondary | ICD-10-CM

## 2023-01-19 DIAGNOSIS — Z79899 Other long term (current) drug therapy: Secondary | ICD-10-CM

## 2023-01-19 LAB — BASIC METABOLIC PANEL
BUN/Creatinine Ratio: 17 (ref 12–28)
BUN: 12 mg/dL (ref 8–27)
CO2: 25 mmol/L (ref 20–29)
Calcium: 9.9 mg/dL (ref 8.7–10.3)
Chloride: 98 mmol/L (ref 96–106)
Creatinine, Ser: 0.69 mg/dL (ref 0.57–1.00)
Glucose: 84 mg/dL (ref 70–99)
Potassium: 4 mmol/L (ref 3.5–5.2)
Sodium: 137 mmol/L (ref 134–144)
eGFR: 84 mL/min/{1.73_m2} (ref >59)

## 2023-01-19 LAB — PRO B NATRIURETIC PEPTIDE: NT-Pro BNP: 1353 pg/mL — ABNORMAL HIGH (ref 0–738)

## 2023-01-19 MED ORDER — FUROSEMIDE 20 MG PO TABS: ORAL_TABLET | ORAL | 3 refills | Status: DC

## 2023-01-19 NOTE — Telephone Encounter (Signed)
-----   Message from Tereso Newcomer sent at 01/19/2023  9:38 AM EDT ----- BNP high. Creatinine, K+ normal.  PLAN:  -Start Lasix 20 mg once daily x 3 days, then 20 mg every Mon, Wed, Fri. -Add BNP to BMET scheduled for 1 week Tereso Newcomer, PA-C    01/19/2023 9:35 AM

## 2023-01-19 NOTE — Telephone Encounter (Signed)
Cardiology sent me a note, on Plavix 75, patient concerned with bruising, they were asking about aspirin. I called the patient, reports bruising with plavix, bothersome to her. Had a fall few weeks ago.   Cryptogenic stroke November 2022, multiple embolic right brain and cerebellar infarcts on aspirin 81 mg, Switched to Plavix 75 mg since. LR has not shown any AFIB.   I called the patient. She can switch to aspirin 81 daily due to bruising. She is aware she may be at risk for recurrent stroke, but is willing to proceed, monitor bruising.

## 2023-01-20 ENCOUNTER — Telehealth: Payer: Self-pay | Admitting: Physician Assistant

## 2023-01-20 NOTE — Telephone Encounter (Signed)
Pt returning nurse Victorino Dike) call from yesterday regarding Lasix medication. Pt also states that she has a question about medication spironolactone (ALDACTONE) 25 MG tablet . She would like to know if she is able to take both medications together and what time to take them. Please advise

## 2023-01-20 NOTE — Telephone Encounter (Signed)
-----   Message from [name] sent at 01/19/2023  9:38 AM EDT ----- BNP high. Creatinine, K+ normal.  PLAN:  -Start Lasix 20 mg once daily x 3 days, then 20 mg every Mon, Wed, Fri. -Add BNP to BMET scheduled for 1 week Yvonne Newcomer, PA-C    01/19/2023 9:35 AM       Spoke with the patient and reviewed recommendations from [place]. Patient states that she started the lasix today.

## 2023-01-24 ENCOUNTER — Encounter: Payer: Self-pay | Admitting: Physician Assistant

## 2023-01-24 NOTE — Telephone Encounter (Signed)
This encounter was created in error - please disregard.

## 2023-01-24 NOTE — Telephone Encounter (Signed)
-----   Message from Delia Heady sent at 01/23/2023  5:44 PM EDT ----- Agree with changing clopidogrel to aspirin 81 mg daily ----- Message ----- From: Beatrice Lecher, PA-C Sent: 01/18/2023   5:45 PM EDT To: Micki Riley, MD; Glean Salvo, NP  She notes increased bruising with Clopidogrel. It was started for her stroke in 2022. No AF on ILR to date. She falls frequently. I told her I would check with you to see what you thought about changing Clopidogrel to ASA.

## 2023-01-26 ENCOUNTER — Ambulatory Visit: Payer: Medicare Other | Attending: Cardiovascular Disease

## 2023-01-26 DIAGNOSIS — I493 Ventricular premature depolarization: Secondary | ICD-10-CM

## 2023-01-26 DIAGNOSIS — Z79899 Other long term (current) drug therapy: Secondary | ICD-10-CM

## 2023-01-26 DIAGNOSIS — I5022 Chronic systolic (congestive) heart failure: Secondary | ICD-10-CM

## 2023-01-26 DIAGNOSIS — I2510 Atherosclerotic heart disease of native coronary artery without angina pectoris: Secondary | ICD-10-CM

## 2023-01-26 DIAGNOSIS — Z8673 Personal history of transient ischemic attack (TIA), and cerebral infarction without residual deficits: Secondary | ICD-10-CM

## 2023-01-27 ENCOUNTER — Telehealth: Payer: Self-pay | Admitting: Internal Medicine

## 2023-01-27 LAB — BASIC METABOLIC PANEL
BUN/Creatinine Ratio: 16 (ref 12–28)
BUN: 13 mg/dL (ref 8–27)
CO2: 27 mmol/L (ref 20–29)
Calcium: 9.9 mg/dL (ref 8.7–10.3)
Chloride: 91 mmol/L — ABNORMAL LOW (ref 96–106)
Creatinine, Ser: 0.82 mg/dL (ref 0.57–1.00)
Glucose: 97 mg/dL (ref 70–99)
Potassium: 4.6 mmol/L (ref 3.5–5.2)
Sodium: 130 mmol/L — ABNORMAL LOW (ref 134–144)
eGFR: 70 mL/min/{1.73_m2} (ref >59)

## 2023-01-27 LAB — PRO B NATRIURETIC PEPTIDE: NT-Pro BNP: 473 pg/mL (ref 0–738)

## 2023-01-27 NOTE — Telephone Encounter (Signed)
Spoke with patient and she is aware of provider recommendations. She states she does not feel secure because she feels she gets told different things all the time. She would like to know why would you increase her losartan to 50 mg. She states you just added spironolactone last week. Last month Dr. Lalla Brothers added metoprolol. She states she does want to keep adding and taking away medications. She states she feels she is on too many medications and she informed you at visit she feels tired and weak some days. She does not want to increase her losartan.

## 2023-01-27 NOTE — Progress Notes (Signed)
[facility] Summary Report / Loop Recorder 

## 2023-01-27 NOTE — Telephone Encounter (Signed)
 Patient called to follow up on lab test results.

## 2023-01-30 NOTE — Telephone Encounter (Signed)
Losartan increased based upon recent BP readings submitted by patient. Several were above goal. Ok to hold off on increasing Losartan for now. Ask pt to monitor BP and bring readings to next visit to review. Tereso Newcomer, PA-C    01/30/2023 6:35 PM

## 2023-01-31 NOTE — Telephone Encounter (Signed)
Pt has been made aware that it is ok not to increase the Losartan at this time.  She has been advised to continue to monitor her blood pressure and to bring the readings with her on her next office visit, 02/20/23.  Pt verbalized understanding.

## 2023-02-02 ENCOUNTER — Ambulatory Visit: Payer: Medicare Other | Attending: Physician Assistant

## 2023-02-02 DIAGNOSIS — Z8673 Personal history of transient ischemic attack (TIA), and cerebral infarction without residual deficits: Secondary | ICD-10-CM

## 2023-02-02 DIAGNOSIS — I493 Ventricular premature depolarization: Secondary | ICD-10-CM

## 2023-02-02 DIAGNOSIS — I2510 Atherosclerotic heart disease of native coronary artery without angina pectoris: Secondary | ICD-10-CM | POA: Diagnosis not present

## 2023-02-02 DIAGNOSIS — I5022 Chronic systolic (congestive) heart failure: Secondary | ICD-10-CM | POA: Diagnosis not present

## 2023-02-02 LAB — BASIC METABOLIC PANEL
BUN/Creatinine Ratio: 12 (ref 12–28)
BUN: 13 mg/dL (ref 8–27)
CO2: 26 mmol/L (ref 20–29)
Calcium: 10.1 mg/dL (ref 8.7–10.3)
Chloride: 94 mmol/L — ABNORMAL LOW (ref 96–106)
Creatinine, Ser: 1.05 mg/dL — ABNORMAL HIGH (ref 0.57–1.00)
Glucose: 117 mg/dL — ABNORMAL HIGH (ref 70–99)
Potassium: 4.4 mmol/L (ref 3.5–5.2)
Sodium: 132 mmol/L — ABNORMAL LOW (ref 134–144)
eGFR: 52 mL/min/{1.73_m2} — ABNORMAL LOW (ref >59)

## 2023-02-03 ENCOUNTER — Telehealth: Payer: Self-pay | Admitting: *Deleted

## 2023-02-03 DIAGNOSIS — Z79899 Other long term (current) drug therapy: Secondary | ICD-10-CM

## 2023-02-03 MED ORDER — SPIRONOLACTONE 25 MG PO TABS: 12.5000 mg | ORAL_TABLET | Freq: Every day | ORAL | Status: DC

## 2023-02-03 NOTE — Telephone Encounter (Signed)
-----   Message from Tereso Newcomer sent at 02/03/2023 12:35 PM EDT ----- Renal function (creatinine) worse.  Sodium stable.  Potassium normal. PLAN:  -Decrease spironolactone to 12.5 mg daily -Repeat BMET 1 week Tereso Newcomer, PA-C    02/03/2023 12:33 PM

## 2023-02-07 ENCOUNTER — Emergency Department (HOSPITAL_BASED_OUTPATIENT_CLINIC_OR_DEPARTMENT_OTHER): Payer: Medicare Other

## 2023-02-07 ENCOUNTER — Encounter (HOSPITAL_BASED_OUTPATIENT_CLINIC_OR_DEPARTMENT_OTHER): Payer: Self-pay | Admitting: Emergency Medicine

## 2023-02-07 ENCOUNTER — Other Ambulatory Visit: Payer: Self-pay

## 2023-02-07 ENCOUNTER — Emergency Department (HOSPITAL_BASED_OUTPATIENT_CLINIC_OR_DEPARTMENT_OTHER)
Admission: EM | Admit: 2023-02-07 | Discharge: 2023-02-07 | Disposition: A | Discharge status: Home / Self Care | Payer: Medicare Other | Attending: Emergency Medicine | Admitting: Emergency Medicine

## 2023-02-07 DIAGNOSIS — S0990XA Unspecified injury of head, initial encounter: Secondary | ICD-10-CM

## 2023-02-07 DIAGNOSIS — Z7902 Long term (current) use of antithrombotics/antiplatelets: Secondary | ICD-10-CM | POA: Diagnosis not present

## 2023-02-07 DIAGNOSIS — W19XXXA Unspecified fall, initial encounter: Secondary | ICD-10-CM

## 2023-02-07 DIAGNOSIS — Z79899 Other long term (current) drug therapy: Secondary | ICD-10-CM | POA: Diagnosis not present

## 2023-02-07 DIAGNOSIS — S0001XA Abrasion of scalp, initial encounter: Secondary | ICD-10-CM | POA: Insufficient documentation

## 2023-02-07 DIAGNOSIS — W06XXXA Fall from bed, initial encounter: Secondary | ICD-10-CM | POA: Diagnosis not present

## 2023-02-07 DIAGNOSIS — E039 Hypothyroidism, unspecified: Secondary | ICD-10-CM | POA: Diagnosis not present

## 2023-02-07 DIAGNOSIS — S0003XA Contusion of scalp, initial encounter: Secondary | ICD-10-CM | POA: Diagnosis not present

## 2023-02-07 LAB — URINALYSIS, ROUTINE W REFLEX MICROSCOPIC
Bilirubin Urine: NEGATIVE
Glucose, UA: NEGATIVE mg/dL
Hgb urine dipstick: NEGATIVE
Ketones, ur: NEGATIVE mg/dL
Leukocytes,Ua: NEGATIVE
Nitrite: NEGATIVE
Protein, ur: NEGATIVE mg/dL
Specific Gravity, Urine: 1.01 (ref 1.005–1.030)
pH: 6 (ref 5.0–8.0)

## 2023-02-07 NOTE — Discharge Instructions (Signed)
Local wound care with bacitracin and dressing changes twice daily.  Return to the ER for severe headache, difficulty with balance, or for other new and concerning symptoms.

## 2023-02-07 NOTE — ED Triage Notes (Signed)
Pt up to restroom and fell, hit head on floor, has bump/bruise and bleeding from right back of head. Pt is on Plavix.

## 2023-02-07 NOTE — ED Provider Notes (Signed)
[place] [facility] Provider Note   CSN: [id number] Arrival date & time: 02/07/23  4098     History  Chief Complaint  Patient presents with   Fall   Head Injury    [name] is a 89 y.o. female.  Patient is an 89 year old female with past medical history of prior CVA on Plavix, hypothyroidism, hyperlipidemia.  Patient presenting today with complaints of a head injury.  She was getting out of bed to go to the bathroom when she lost her balance and fell.  She fell backward and struck her head on the floor.  There was no loss of consciousness, but she does have a headache and an abrasion to the right occipital scalp.  She denies any weakness or numbness.  No visual disturbances.  She denies other injury.  The history is provided by the patient.       Home Medications Prior to Admission medications   Medication Sig Start Date End Date Taking? Authorizing Provider  ALPRAZolam Prudy Feeler) 0.5 MG tablet Take 0.25 mg by mouth 2 (two) times daily as needed (restless legs).    [provider]  amoxicillin (AMOXIL) 500 MG capsule Take 2,000 mg by mouth as needed (dental). Take 4 capsules (2000 mg) by mouth one hour prior to dental appointments 03/29/21   [provider]  Ascorbic Acid (VITAMIN C PO) Take 1 tablet by mouth daily.    [provider]  Calcium Carbonate-Vitamin D (CALCIUM-D PO) Take 1 tablet by mouth daily.    [provider]  celecoxib (CELEBREX) 200 MG capsule Take 1 tablet by mouth as needed. 09/28/20   [provider]  clopidogrel (PLAVIX) 75 MG tablet Take 1 tablet (75 mg total) by mouth daily. 04/23/21   Albertine Grates, MD  dexlansoprazole (DEXILANT) 60 MG capsule Take 60 mg by mouth every morning.    [provider]  escitalopram (LEXAPRO) 10 MG tablet Take 10 mg by mouth at bedtime.    [provider]  furosemide (LASIX) 20 MG tablet TAKE 1 TABLET BY MOUTH DAILY X'S 3 DAYS THEN  REDUCE TO 1 TABLET BY MOUTH ONLY ON MONDAY, WEDNESDAY, & FRIDAY 01/19/23   Tereso Newcomer T, PA-C  HYDROcodone-acetaminophen (NORCO/VICODIN) 5-325 MG tablet Take 1 tablet by mouth at bedtime as needed. 01/03/23   Small, Brooke L, PA  levothyroxine (SYNTHROID, LEVOTHROID) 25 MCG tablet Take 25 mcg by mouth daily before breakfast.     [provider]  losartan (COZAAR) 50 MG tablet Take 1 tablet (50 mg total) by mouth daily. 10/31/22   Orbie Pyo, MD  metoprolol succinate (TOPROL-XL) 50 MG 24 hr tablet Take 1.5 tablets (75 mg total) by mouth daily. Take with or immediately following a meal. 12/28/22   Lanier Prude, MD  Multiple Vitamin (MULTIVITAMIN WITH MINERALS) TABS tablet Take 1 tablet by mouth daily.    [provider]  pyridoxine (B-6) 500 MG tablet Take 1 tablet (500 mg total) by mouth daily. 02/24/22   Josph Macho, MD  rosuvastatin (CRESTOR) 20 MG tablet Take 1 tablet (20 mg total) by mouth daily. 04/22/21   Albertine Grates, MD  spironolactone (ALDACTONE) 25 MG tablet Take 0.5 tablets (12.5 mg total) by mouth daily. 02/03/23 05/04/23  Tereso Newcomer T, PA-C  Vitamin D, Ergocalciferol, (DRISDOL) 50000 UNITS CAPS Take 50,000 Units by mouth every Friday.    [provider]  VITAMIN E PO Take 1 capsule by mouth daily.  [provider]  Wheat Dextrin (BENEFIBER) POWD Take 1 daily dose 02/09/21   Tressia Danas, MD  zolpidem (AMBIEN CR) 12.5 MG CR tablet Take 12.5 mg by mouth at bedtime.    [provider]      Allergies    Patient has no known allergies.    Review of Systems   Review of Systems  All other systems reviewed and are negative.   Physical Exam Updated Vital Signs BP (!) 182/92   Pulse 77   Temp 98.7 F (37.1 C) (Oral)   Resp 17   Ht 5\' 1"  (1.549 m)   Wt 62 kg   SpO2 100%   BMI 25.83 kg/m  Physical Exam Vitals and nursing note reviewed.  Constitutional:      General: She is not in acute distress.    Appearance: She is  well-developed. She is not diaphoretic.  HENT:     Head: Normocephalic.     Comments: There is an abrasion and swelling noted to the right upper occipital scalp.  There is no palpable defect and no repairable laceration. Cardiovascular:     Rate and Rhythm: Normal rate and regular rhythm.     Heart sounds: No murmur heard.    No friction rub. No gallop.  Pulmonary:     Effort: Pulmonary effort is normal. No respiratory distress.     Breath sounds: Normal breath sounds. No wheezing.  Abdominal:     General: Bowel sounds are normal. There is no distension.     Palpations: Abdomen is soft.     Tenderness: There is no abdominal tenderness.  Musculoskeletal:        General: Normal range of motion.     Cervical back: Normal range of motion and neck supple.  Skin:    General: Skin is warm and dry.  Neurological:     General: No focal deficit present.     Mental Status: She is alert and oriented to person, place, and time.     ED Results / Procedures / Treatments   Labs (all labs ordered are listed, but only abnormal results are displayed) Labs Reviewed - No data to display  EKG None  Radiology No results found.  Procedures Procedures    Medications Ordered in ED Medications - No data to display  ED Course/ Medical Decision Making/ A&P  Patient is an 89 year old female presenting with complaints of fall.  She fell attempting to ambulate to the bathroom and struck the back of her head.  She arrives here neurologically intact.  Vital signs are stable and patient is afebrile.  CT scan of the head obtained showing a scalp contusion, but no intracranial hemorrhage or other abnormality.  At this point, patient seems appropriate for discharge.  There is no evidence for bleed or skull fracture.  She was started on an antibiotic for possible UTI yesterday.  Even though her urinalysis is clear today, due to her history of UTIs I will have her continue this.  Final Clinical  Impression(s) / ED Diagnoses Final diagnoses:  None    Rx / DC Orders ED Discharge Orders     None         Geoffery Lyons, MD 02/07/23 [phone]

## 2023-02-10 ENCOUNTER — Ambulatory Visit: Payer: Medicare Other | Attending: Internal Medicine

## 2023-02-10 DIAGNOSIS — Z79899 Other long term (current) drug therapy: Secondary | ICD-10-CM

## 2023-02-11 LAB — BASIC METABOLIC PANEL
BUN/Creatinine Ratio: 17 (ref 12–28)
BUN: 13 mg/dL (ref 8–27)
CO2: 23 mmol/L (ref 20–29)
Calcium: 9.7 mg/dL (ref 8.7–10.3)
Chloride: 97 mmol/L (ref 96–106)
Creatinine, Ser: 0.77 mg/dL (ref 0.57–1.00)
Glucose: 81 mg/dL (ref 70–99)
Potassium: 4.4 mmol/L (ref 3.5–5.2)
Sodium: 132 mmol/L — ABNORMAL LOW (ref 134–144)
eGFR: 75 mL/min/{1.73_m2} (ref >59)

## 2023-02-13 ENCOUNTER — Telehealth: Payer: Self-pay | Admitting: Internal Medicine

## 2023-02-13 NOTE — Progress Notes (Signed)
Pt has been made aware of normal result and verbalized understanding.  jw

## 2023-02-13 NOTE — Telephone Encounter (Signed)
Patient calling to results to lab work

## 2023-02-13 NOTE — Telephone Encounter (Signed)
Returned call to pt and she has been made aware of her lab results.

## 2023-02-13 NOTE — Telephone Encounter (Signed)
-----   Message from Tereso Newcomer sent at 02/13/2023  6:45 AM EDT ----- Sodium is stable. Kidney function (creatinine) improved and is now normal. Potassium normal.  PLAN:  - Tell her Happy Birthday - Continue current medications/treatment plan and follow up as scheduled.  Tereso Newcomer, PA-C    02/13/2023 6:43 AM

## 2023-02-16 ENCOUNTER — Inpatient Hospital Stay (HOSPITAL_BASED_OUTPATIENT_CLINIC_OR_DEPARTMENT_OTHER): Payer: Medicare Other | Admitting: Hematology & Oncology

## 2023-02-16 ENCOUNTER — Encounter: Payer: Self-pay | Admitting: Hematology & Oncology

## 2023-02-16 ENCOUNTER — Inpatient Hospital Stay: Payer: Medicare Other | Attending: Hematology & Oncology

## 2023-02-16 VITALS — BP 133/80 | HR 77 | Temp 98.0°F | Resp 20 | Ht 61.0 in | Wt 133.8 lb

## 2023-02-16 DIAGNOSIS — R519 Headache, unspecified: Secondary | ICD-10-CM | POA: Diagnosis not present

## 2023-02-16 DIAGNOSIS — Z7902 Long term (current) use of antithrombotics/antiplatelets: Secondary | ICD-10-CM | POA: Insufficient documentation

## 2023-02-16 DIAGNOSIS — Z79899 Other long term (current) drug therapy: Secondary | ICD-10-CM | POA: Diagnosis not present

## 2023-02-16 DIAGNOSIS — D472 Monoclonal gammopathy: Secondary | ICD-10-CM | POA: Insufficient documentation

## 2023-02-16 DIAGNOSIS — G629 Polyneuropathy, unspecified: Secondary | ICD-10-CM | POA: Diagnosis not present

## 2023-02-16 DIAGNOSIS — M791 Myalgia, unspecified site: Secondary | ICD-10-CM | POA: Diagnosis not present

## 2023-02-16 LAB — CBC WITH DIFFERENTIAL (CANCER CENTER ONLY)
Abs Immature Granulocytes: 0.01 10*3/uL (ref 0.00–0.07)
Basophils Absolute: 0 10*3/uL (ref 0.0–0.1)
Basophils Relative: 0 %
Eosinophils Absolute: 0 10*3/uL (ref 0.0–0.5)
Eosinophils Relative: 1 %
HCT: 36.4 % (ref 36.0–46.0)
Hemoglobin: 12 g/dL (ref 12.0–15.0)
Immature Granulocytes: 0 %
Lymphocytes Relative: 26 %
Lymphs Abs: 1.2 10*3/uL (ref 0.7–4.0)
MCH: 32.8 pg (ref 26.0–34.0)
MCHC: 33 g/dL (ref 30.0–36.0)
MCV: 99.5 fL (ref 80.0–100.0)
Monocytes Absolute: 0.6 10*3/uL (ref 0.1–1.0)
Monocytes Relative: 12 %
Neutro Abs: 2.7 10*3/uL (ref 1.7–7.7)
Neutrophils Relative %: 61 %
Platelet Count: 258 10*3/uL (ref 150–400)
RBC: 3.66 MIL/uL — ABNORMAL LOW (ref 3.87–5.11)
RDW: 13.5 % (ref 11.5–15.5)
WBC Count: 4.4 10*3/uL (ref 4.0–10.5)
nRBC: 0 % (ref 0.0–0.2)

## 2023-02-16 LAB — CMP (CANCER CENTER ONLY)
ALT: 15 U/L (ref 0–44)
AST: 22 U/L (ref 15–41)
Albumin: 4.4 g/dL (ref 3.5–5.0)
Alkaline Phosphatase: 71 U/L (ref 38–126)
Anion gap: 6 (ref 5–15)
BUN: 14 mg/dL (ref 8–23)
CO2: 29 mmol/L (ref 22–32)
Calcium: 9.8 mg/dL (ref 8.9–10.3)
Chloride: 96 mmol/L — ABNORMAL LOW (ref 98–111)
Creatinine: 0.85 mg/dL (ref 0.44–1.00)
GFR, Estimated: >60 mL/min (ref >60)
Glucose, Bld: 96 mg/dL (ref 70–99)
Potassium: 4.4 mmol/L (ref 3.5–5.1)
Sodium: 131 mmol/L — ABNORMAL LOW (ref 135–145)
Total Bilirubin: 0.7 mg/dL (ref 0.3–1.2)
Total Protein: 6.3 g/dL — ABNORMAL LOW (ref 6.5–8.1)

## 2023-02-16 LAB — LACTATE DEHYDROGENASE: LDH: 189 U/L (ref 98–192)

## 2023-02-16 NOTE — Progress Notes (Signed)
Hematology and Oncology Follow Up Visit  [name] [id number] 1933/09/23 89 y.o. 02/16/2023   Principle Diagnosis:  IgA Kappa MGUS  Current Therapy:   Observation     Interim History:  [name] is back for follow-up.  Unfortunately, she fell a couple weeks ago.  She fell backwards.  She just lost [name] balance.  She hit [name] head.  She went to the ER.  She had a CT of the head on 02/07/2023.  It showed a large scalp hematoma.  There is no fracture.  [name] son comes in with [name].  [name] family is definitely watching of [name] right now.  They have a [place] for [name].  Hopefully she will use it..  She does have some neuropathy.  I am sure this is probably part of the etiology of [name] falling.  I think that she is on Plavix.  This might be a problem given the fact that she is falling.  I think she is going talk to [name] cardiologist about this.  She had been having some blood pressure issues.    As far as his MGUS is concerned, this really is not a problem for [place].  Back in May, [name] last monoclonal spike was 0.2 g/dL.  [name] IgA level was 322 mg/dL.  [name] Kappa light chain was 2.3 mg/dL.  She has had no problems with COVID.  There is been no issues with nausea or vomiting.  She has had no change in bowel or bladder habits.  Overall, I would say performance status is ECOG 2.    Medications:  Current Outpatient Medications:    ALPRAZolam (XANAX) 0.5 MG tablet, Take 0.25 mg by mouth 2 (two) times daily as needed (restless legs)., Disp: , Rfl:    Ascorbic Acid (VITAMIN C PO), Take 1 tablet by mouth daily., Disp: , Rfl:    Calcium Carbonate-Vitamin D (CALCIUM-D PO), Take 1 tablet by mouth daily., Disp: , Rfl:    celecoxib (CELEBREX) 200 MG capsule, Take 1 tablet by mouth as needed., Disp: , Rfl:    clopidogrel (PLAVIX) 75 MG tablet, Take 1 tablet (75 mg total) by mouth daily., Disp: 30 tablet, Rfl: 3   dexlansoprazole (DEXILANT) 60 MG capsule, Take 60 mg by mouth every morning., Disp: , Rfl:     escitalopram (LEXAPRO) 10 MG tablet, Take 10 mg by mouth at bedtime., Disp: , Rfl:    levothyroxine (SYNTHROID, LEVOTHROID) 25 MCG tablet, Take 25 mcg by mouth daily before breakfast. , Disp: , Rfl:    losartan (COZAAR) 50 MG tablet, Take 1 tablet (50 mg total) by mouth daily., Disp: 90 tablet, Rfl: 3   metoprolol succinate (TOPROL-XL) 50 MG 24 hr tablet, Take 1.5 tablets (75 mg total) by mouth daily. Take with or immediately following a meal., Disp: 135 tablet, Rfl: 3   Multiple Vitamin (MULTIVITAMIN WITH MINERALS) TABS tablet, Take 1 tablet by mouth daily., Disp: , Rfl:    Probiotic Product (ALIGN) 4 MG CAPS, 1 capsule by mouth once a day, Disp: , Rfl:    pyridoxine (B-6) 500 MG tablet, Take 1 tablet (500 mg total) by mouth daily., Disp: 30 tablet, Rfl: 5   rosuvastatin (CRESTOR) 20 MG tablet, Take 1 tablet (20 mg total) by mouth daily., Disp: 30 tablet, Rfl: 0   spironolactone (ALDACTONE) 25 MG tablet, Take 0.5 tablets (12.5 mg total) by mouth daily., Disp: , Rfl:    Vitamin D, Ergocalciferol, (DRISDOL) 50000 UNITS CAPS, Take 50,000 Units by mouth every Friday.,  Disp: , Rfl:    VITAMIN E PO, Take 1 capsule by mouth daily., Disp: , Rfl:    Wheat Dextrin (BENEFIBER) POWD, Take 1 daily dose, Disp: 500 g, Rfl: 0   zolpidem (AMBIEN CR) 12.5 MG CR tablet, Take 12.5 mg by mouth at bedtime., Disp: , Rfl:    amoxicillin (AMOXIL) 500 MG capsule, Take 2,000 mg by mouth as needed (dental). Take 4 capsules (2000 mg) by mouth one hour prior to dental appointments (Patient not taking: Reported on 02/16/2023), Disp: , Rfl:   Allergies: No Known Allergies  Past Medical History, Surgical history, Social history, and Family History were reviewed and updated.  Review of Systems: Review of Systems  Constitutional: Negative.  Negative for appetite change, fatigue, fever and unexpected weight change.  HENT:  Negative.  Negative for lump/mass, mouth sores, sore throat and trouble swallowing.   Eyes: Negative.    Respiratory: Negative.  Negative for cough, hemoptysis and shortness of breath.   Cardiovascular: Negative.  Negative for leg swelling and palpitations.  Gastrointestinal: Negative.  Negative for abdominal distention, abdominal pain, blood in stool, constipation, diarrhea, nausea and vomiting.  Endocrine: Negative.   Genitourinary: Negative.  Negative for bladder incontinence, dysuria, frequency and hematuria.   Musculoskeletal:  Positive for myalgias. Negative for arthralgias, back pain and gait problem.  Skin: Negative.  Negative for itching and rash.  Neurological:  Positive for headaches. Negative for dizziness, extremity weakness, gait problem, numbness, seizures and speech difficulty.  Hematological: Negative.  Does not bruise/bleed easily.  Psychiatric/Behavioral: Negative.  Negative for depression and sleep disturbance. The patient is not nervous/anxious.     Physical Exam:  height is 5\' 1"  (1.549 m) and weight is 133 lb 12.8 oz (60.7 kg). [name] oral temperature is 98 F (36.7 C). [name] blood pressure is 133/80 and [name] pulse is 77. [name] respiration is 20 and oxygen saturation is 98%.   Wt Readings from Last 3 Encounters:  02/16/23 133 lb 12.8 oz (60.7 kg)  02/07/23 136 lb 11 oz (62 kg)  01/18/23 134 lb 6.4 oz (61 kg)    Physical Exam Vitals reviewed.  HENT:     Head: Normocephalic and atraumatic.  Eyes:     Pupils: Pupils are equal, round, and reactive to light.  Cardiovascular:     Rate and Rhythm: Normal rate and regular rhythm.     Heart sounds: Normal heart sounds.  Pulmonary:     Effort: Pulmonary effort is normal.     Breath sounds: Normal breath sounds.  Abdominal:     General: Bowel sounds are normal.     Palpations: Abdomen is soft.  Musculoskeletal:        General: No tenderness or deformity. Normal range of motion.     Cervical back: Normal range of motion.  Lymphadenopathy:     Cervical: No cervical adenopathy.  Skin:    General: Skin is warm and dry.      Findings: No erythema or rash.  Neurological:     Mental Status: She is alert and oriented to person, place, and time.  Psychiatric:        Behavior: Behavior normal.        Thought Content: Thought content normal.        Judgment: Judgment normal.      Lab Results  Component Value Date   WBC 4.4 02/16/2023   HGB 12.0 02/16/2023   HCT 36.4 02/16/2023   MCV 99.5 02/16/2023   PLT 258 02/16/2023  Chemistry      Component Value Date/Time   NA 131 (L) 02/16/2023 1200   NA 132 (L) 02/10/2023 1159   K 4.4 02/16/2023 1200   CL 96 (L) 02/16/2023 1200   CO2 29 02/16/2023 1200   BUN 14 02/16/2023 1200   BUN 13 02/10/2023 1159   CREATININE 0.85 02/16/2023 1200      Component Value Date/Time   CALCIUM 9.8 02/16/2023 1200   ALKPHOS 71 02/16/2023 1200   AST 22 02/16/2023 1200   ALT 15 02/16/2023 1200   BILITOT 0.7 02/16/2023 1200      Impression and Plan: Ms. Ringor is a 89 year old white female.  She has an IgA kappa MGUS.  This is still at a very low level.  This certainly could be from [name] age alone.  I think the big problem is his following issue.  Hopefully, this will improve.  Hopefully she will use a Brashier.  I think she is getting some physical therapy at home.  We are going to have to watch [name] closely.  I would like to have [name] come back in November Before the Holiday season to make sure that everything is improving.    Josph Macho, MD 9/19/20241:41 PM

## 2023-02-18 LAB — IGG, IGA, IGM
IgA: 280 mg/dL (ref 64–422)
IgG (Immunoglobin G), Serum: 606 mg/dL (ref 586–1602)
IgM (Immunoglobulin M), Srm: 59 mg/dL (ref 26–217)

## 2023-02-20 ENCOUNTER — Ambulatory Visit: Payer: Medicare Other | Attending: Physician Assistant | Admitting: Physician Assistant

## 2023-02-20 ENCOUNTER — Ambulatory Visit: Payer: Medicare Other

## 2023-02-20 ENCOUNTER — Encounter: Payer: Self-pay | Admitting: Physician Assistant

## 2023-02-20 VITALS — BP 134/80 | HR 78 | Ht 61.0 in | Wt 135.6 lb

## 2023-02-20 DIAGNOSIS — E782 Mixed hyperlipidemia: Secondary | ICD-10-CM

## 2023-02-20 DIAGNOSIS — I5022 Chronic systolic (congestive) heart failure: Secondary | ICD-10-CM | POA: Diagnosis not present

## 2023-02-20 DIAGNOSIS — I639 Cerebral infarction, unspecified: Secondary | ICD-10-CM | POA: Diagnosis not present

## 2023-02-20 DIAGNOSIS — I10 Essential (primary) hypertension: Secondary | ICD-10-CM

## 2023-02-20 DIAGNOSIS — I2510 Atherosclerotic heart disease of native coronary artery without angina pectoris: Secondary | ICD-10-CM

## 2023-02-20 DIAGNOSIS — Z8673 Personal history of transient ischemic attack (TIA), and cerebral infarction without residual deficits: Secondary | ICD-10-CM

## 2023-02-20 DIAGNOSIS — I493 Ventricular premature depolarization: Secondary | ICD-10-CM

## 2023-02-20 MED ORDER — ASPIRIN 81 MG PO TBEC: 81.0000 mg | DELAYED_RELEASE_TABLET | Freq: Every day | ORAL | 12 refills | Status: DC

## 2023-02-20 NOTE — Progress Notes (Signed)
Cardiology Office Note:    Date:  02/20/2023  ID:  [name], DOB [birth year], MRN [id number] PCP: Geoffry Paradise, MD  [facility] Providers Cardiologist:  Orbie Pyo, MD Electrophysiologist:  Lanier Prude, MD       Patient Profile:      Non-obstructive coronary artery disease  CCTA 10/07/2006: CAC score 1; LAD < 30% soft plaque   Myoview 03/30/2015: EF 83, no ischemia; low risk  HFmrEF (heart failure with mildly reduced ejection fraction)  TTE 10/03/22: EF 42, global HK, normal RVSF, mild MR, ascending aorta dilation (38 mm), RAP 3 TTE 01/18/2023: EF 45-50 (47), posterior HK, mild septal LVH, GR 1 DD, normal RVSF, normal PASP, trivial MR, mild AI, RAP 3 CVA in 11/22  TTE 04/21/2021: EF 60-65, no RWMA, Gr 1 DD, normal RVSF, trivial MR, trivial AI S/p ILR (Dr. Lalla Brothers)  PVCs Monitor 05/2021: PVCs 18%; no atrial fibrillation Burden monitored by ILR Allergic to Amiodarone (rash) Hypertension  Hyperlipidemia  Hyponatremia GERD  MGUS         History of Present Illness:  Discussed the use of AI scribe software for clinical note transcription with the patient, who gave verbal consent to proceed.   [name] is a 89 y.o. female who returns for follow-up of CHF, PVCs, HTN, CAD.  She was last seen 01/18/2023.  She was noted to have 18% PVCs on previous monitor.  EF decreased to 42 on TTE in May 2024.  She could not tolerate amiodarone secondary to rash.  She has been followed by EP and plan is to manage her PVCs conservatively with beta-blocker therapy.  At last visit, I added spironolactone to help with blood pressure control as well as to advance GDMT for CHF.  Of note, follow-up echocardiogram in August 2024 demonstrated somewhat improved LV function with an EF of 45-50.  She did go to the emergency room 02/07/2023 after a fall.  Head CT was negative for bleed.  She notes somewhat improved breathing since we adjust her medications.  She does still feel weak.  She  continues to have issues with falling.  She is trying to use a [employer].  She has not had chest pain.  She has not had syncope or leg edema.    ROS: See HPI. Notes frequent falls.    Studies Reviewed:       Risk Assessment/Calculations:             Physical Exam:   VS:  BP 134/80   Pulse 78   Ht 5\' 1"  (1.549 m)   Wt 135 lb 9.6 oz (61.5 kg)   SpO2 94%   BMI 25.62 kg/m    Wt Readings from Last 3 Encounters:  02/20/23 135 lb 9.6 oz (61.5 kg)  02/16/23 133 lb 12.8 oz (60.7 kg)  02/07/23 136 lb 11 oz (62 kg)    Constitutional:      Appearance: Healthy appearance. Not in distress.  Neck:     Vascular: No JVR. JVD normal.  Pulmonary:     Breath sounds: Normal breath sounds. No wheezing. No rales.  Cardiovascular:     Normal rate. Regularly irregular rhythm.     Murmurs: There is no murmur.  Edema:    Peripheral edema absent.  Abdominal:     Palpations: Abdomen is soft.  Skin:    General: Skin is warm and dry.       Assessment and Plan:     Heart Failure with  Mildly Reduced Ejection Fraction NYHA 2B-3, volume status stable.  Recent echocardiogram with improved EF at 45-50%. -Continue Losartan 50mg  daily, Toprol XL 75mg  daily, Spironolactone 12.5mg  daily. -Follow up in six months.  Coronary Artery Disease Non-obstructive by CCTA in 2008, low risk Myoview in 2016.  No chest symptoms to suggest angina.  She had been on Plavix since her stroke.  We checked with neurology recently who approved switching Plavix to aspirin. -Stop Plavix, start Aspirin 81mg  daily. -Continue Crestor 20mg  daily.  Hypertension Controlled.  She brings in a list of her blood pressures today.  Several blood pressures from home are optimal. -Continue Losartan 50mg  daily, Toprol XL 75mg  daily, Spironolactone 12.5mg  daily.  Premature Ventricular Contractions (PVCs) She was allergic to amiodarone (rash).  Managed conservatively with beta-blocker therapy. -Continue Toprol XL 75mg   daily.  Hyperlipidemia LDL optimal in May 2024 at 44. -Continue Crestor 20mg  daily.  History of Stroke S/p ILR.  No A-fib detected today.  As noted, I discussed with neurology previously.  She may stop taking Plavix and replace with aspirin. -Stop Plavix, start Aspirin 81mg  daily.         Dispo:  Return in about 6 months (around 08/20/2023) for Routine Follow Up, w/ Dr. Lynnette Caffey, or Tereso Newcomer, PA-C.  Signed, Tereso Newcomer, PA-C

## 2023-02-20 NOTE — Patient Instructions (Signed)
Medication Instructions:  Your physician has recommended you make the following change in your medication:   STOP Plavix  START Aspirin 81 mg taking 1 daily  *If you need a refill on your cardiac medications before your next appointment, please call your pharmacy*   Lab Work: None ordered  If you have labs (blood work) drawn today and your tests are completely normal, you will receive your results only by: MyChart Message (if you have MyChart) OR A paper copy in the mail If you have any lab test that is abnormal or we need to change your treatment, we will call you to review the results.   Testing/Procedures: None ordered   Follow-Up: At [facility], you and your health needs are our priority.  As part of our continuing mission to provide you with exceptional heart care, we have created designated Provider Care Teams.  These Care Teams include your primary Cardiologist (physician) and Advanced Practice Providers (APPs -  Physician Assistants and Nurse Practitioners) who all work together to provide you with the care you need, when you need it.  We recommend signing up for the patient portal called "MyChart".  Sign up information is provided on this After Visit Summary.  MyChart is used to connect with patients for Virtual Visits (Telemedicine).  Patients are able to view lab/test results, encounter notes, upcoming appointments, etc.  Non-urgent messages can be sent to your provider as well.   To learn more about what you can do with MyChart, go to [email or web address].    Your next appointment:   6 month(s)  Provider:   Tereso Newcomer, PA-C         Other Instructions

## 2023-02-21 LAB — PROTEIN ELECTROPHORESIS, SERUM
A/G Ratio: 1.5 (ref 0.7–1.7)
Albumin ELP: 3.8 g/dL (ref 2.9–4.4)
Alpha-1-Globulin: 0.2 g/dL (ref 0.0–0.4)
Alpha-2-Globulin: 0.6 g/dL (ref 0.4–1.0)
Beta Globulin: 0.9 g/dL (ref 0.7–1.3)
Gamma Globulin: 0.7 g/dL (ref 0.4–1.8)
Globulin, Total: 2.5 g/dL (ref 2.2–3.9)
Total Protein ELP: 6.3 g/dL (ref 6.0–8.5)

## 2023-02-21 LAB — KAPPA/LAMBDA LIGHT CHAINS
Kappa free light chain: 30.1 mg/L — ABNORMAL HIGH (ref 3.3–19.4)
Kappa, lambda light chain ratio: 2.3 — ABNORMAL HIGH (ref 0.26–1.65)
Lambda free light chains: 13.1 mg/L (ref 5.7–26.3)

## 2023-02-22 ENCOUNTER — Telehealth: Payer: Self-pay | Admitting: *Deleted

## 2023-02-22 NOTE — Telephone Encounter (Signed)
-----   Message from Josph Macho sent at 02/21/2023  7:51 PM EDT ----- Call - the is NO abnormal myeloma protein in the blood!!!  I am not surprised by this!!!!   Cindee Lame

## 2023-02-22 NOTE — Telephone Encounter (Signed)
Notified pt of results.no concerns at this time.

## 2023-02-23 LAB — PROTEIN ELECTROPHORESIS, SERUM, WITH REFLEX
A/G Ratio: 1.6 (ref 0.7–1.7)
Albumin ELP: 3.8 g/dL (ref 2.9–4.4)
Alpha-1-Globulin: 0.3 g/dL (ref 0.0–0.4)
Alpha-2-Globulin: 0.6 g/dL (ref 0.4–1.0)
Beta Globulin: 0.9 g/dL (ref 0.7–1.3)
Gamma Globulin: 0.6 g/dL (ref 0.4–1.8)
Globulin, Total: 2.4 g/dL (ref 2.2–3.9)
M-Spike, %: 0.2 g/dL — ABNORMAL HIGH
SPEP Interpretation: 0
Total Protein ELP: 6.2 g/dL (ref 6.0–8.5)

## 2023-02-23 LAB — IMMUNOFIXATION REFLEX, SERUM
IgA: 335 mg/dL (ref 64–422)
IgG (Immunoglobin G), Serum: 772 mg/dL (ref 586–1602)
IgM (Immunoglobulin M), Srm: 73 mg/dL (ref 26–217)

## 2023-02-28 DIAGNOSIS — Z23 Encounter for immunization: Secondary | ICD-10-CM | POA: Diagnosis not present

## 2023-03-07 NOTE — Progress Notes (Signed)
[facility] Summary Report / Loop Recorder 

## 2023-03-27 ENCOUNTER — Ambulatory Visit (INDEPENDENT_AMBULATORY_CARE_PROVIDER_SITE_OTHER): Payer: Medicare Other

## 2023-03-27 DIAGNOSIS — I639 Cerebral infarction, unspecified: Secondary | ICD-10-CM

## 2023-03-27 LAB — CUP PACEART REMOTE DEVICE CHECK
Date Time Interrogation Session: 20241027231334
Implantable Pulse Generator Implant Date: 20230407

## 2023-04-11 ENCOUNTER — Other Ambulatory Visit: Payer: Self-pay | Admitting: Physician Assistant

## 2023-04-13 DIAGNOSIS — S81802A Unspecified open wound, left lower leg, initial encounter: Secondary | ICD-10-CM | POA: Diagnosis not present

## 2023-04-17 DIAGNOSIS — S81802A Unspecified open wound, left lower leg, initial encounter: Secondary | ICD-10-CM | POA: Diagnosis not present

## 2023-04-17 NOTE — Progress Notes (Signed)
[facility] Summary Report / Loop Recorder 

## 2023-04-19 DIAGNOSIS — S81802D Unspecified open wound, left lower leg, subsequent encounter: Secondary | ICD-10-CM | POA: Diagnosis not present

## 2023-04-21 DIAGNOSIS — S81802D Unspecified open wound, left lower leg, subsequent encounter: Secondary | ICD-10-CM | POA: Diagnosis not present

## 2023-04-30 LAB — CUP PACEART REMOTE DEVICE CHECK
Date Time Interrogation Session: 20241129230534
Implantable Pulse Generator Implant Date: 20230407

## 2023-05-01 ENCOUNTER — Ambulatory Visit: Payer: Medicare Other

## 2023-05-01 DIAGNOSIS — I639 Cerebral infarction, unspecified: Secondary | ICD-10-CM

## 2023-05-10 DIAGNOSIS — E039 Hypothyroidism, unspecified: Secondary | ICD-10-CM | POA: Diagnosis not present

## 2023-05-10 DIAGNOSIS — R82998 Other abnormal findings in urine: Secondary | ICD-10-CM | POA: Diagnosis not present

## 2023-05-10 DIAGNOSIS — E785 Hyperlipidemia, unspecified: Secondary | ICD-10-CM | POA: Diagnosis not present

## 2023-05-10 DIAGNOSIS — M810 Age-related osteoporosis without current pathological fracture: Secondary | ICD-10-CM | POA: Diagnosis not present

## 2023-05-10 DIAGNOSIS — I10 Essential (primary) hypertension: Secondary | ICD-10-CM | POA: Diagnosis not present

## 2023-05-15 ENCOUNTER — Other Ambulatory Visit: Payer: Self-pay

## 2023-05-15 ENCOUNTER — Emergency Department (HOSPITAL_BASED_OUTPATIENT_CLINIC_OR_DEPARTMENT_OTHER)
Admission: EM | Admit: 2023-05-15 | Discharge: 2023-05-15 | Disposition: A | Discharge status: Home / Self Care | Payer: Medicare Other | Attending: Emergency Medicine | Admitting: Emergency Medicine

## 2023-05-15 ENCOUNTER — Encounter (HOSPITAL_BASED_OUTPATIENT_CLINIC_OR_DEPARTMENT_OTHER): Payer: Self-pay | Admitting: Urology

## 2023-05-15 ENCOUNTER — Emergency Department (HOSPITAL_BASED_OUTPATIENT_CLINIC_OR_DEPARTMENT_OTHER): Payer: Medicare Other

## 2023-05-15 DIAGNOSIS — Z7982 Long term (current) use of aspirin: Secondary | ICD-10-CM | POA: Insufficient documentation

## 2023-05-15 DIAGNOSIS — S0990XA Unspecified injury of head, initial encounter: Secondary | ICD-10-CM

## 2023-05-15 DIAGNOSIS — Y9389 Activity, other specified: Secondary | ICD-10-CM | POA: Diagnosis not present

## 2023-05-15 DIAGNOSIS — I6782 Cerebral ischemia: Secondary | ICD-10-CM | POA: Diagnosis not present

## 2023-05-15 DIAGNOSIS — W1830XA Fall on same level, unspecified, initial encounter: Secondary | ICD-10-CM | POA: Diagnosis not present

## 2023-05-15 DIAGNOSIS — S0003XA Contusion of scalp, initial encounter: Secondary | ICD-10-CM | POA: Diagnosis not present

## 2023-05-15 DIAGNOSIS — S0101XA Laceration without foreign body of scalp, initial encounter: Secondary | ICD-10-CM | POA: Diagnosis not present

## 2023-05-15 NOTE — ED Provider Notes (Signed)
[facility] Provider Note   CSN: [id number] Arrival date & time: 05/15/23  1801     History  Chief Complaint  Patient presents with   Head Injury    [name] is a 89 y.o. female presents today after mechanical fall while trying to kill a spider at approximately 1630.  Patient fell and hit her posterior scalp on an end table.  Patient has 2 lacerations approximately 2 cm in length each.  Bleeding controlled with direct pressure.  Patient denies taking blood thinners.  Patient denies vision changes, tinnitus, nausea, vomiting, any other pain.  Patient did have a mild headache which she took Tylenol for with relief.   Head Injury      Home Medications Prior to Admission medications   Medication Sig Start Date End Date Taking? Authorizing Provider  ALPRAZolam Prudy Feeler) 0.5 MG tablet Take 0.25 mg by mouth 2 (two) times daily as needed (restless legs).    [provider]  amoxicillin (AMOXIL) 500 MG capsule Take 2,000 mg by mouth as needed (dental). Take 4 capsules (2000 mg) by mouth one hour prior to dental appointments 03/29/21   [provider]  Ascorbic Acid (VITAMIN C PO) Take 1 tablet by mouth daily.    [provider]  aspirin EC 81 MG tablet Take 1 tablet (81 mg total) by mouth daily. Swallow whole. 02/20/23   Tereso Newcomer T, PA-C  Calcium Carbonate-Vitamin D (CALCIUM-D PO) Take 1 tablet by mouth daily.    [provider]  celecoxib (CELEBREX) 200 MG capsule Take 1 tablet by mouth as needed. 09/28/20   [provider]  dexlansoprazole (DEXILANT) 60 MG capsule Take 60 mg by mouth every morning.    [provider]  escitalopram (LEXAPRO) 10 MG tablet Take 10 mg by mouth at bedtime.    [provider]  levothyroxine (SYNTHROID, LEVOTHROID) 25 MCG tablet Take 25 mcg by mouth daily before breakfast.     [provider]  losartan (COZAAR) 50 MG tablet Take 1 tablet  (50 mg total) by mouth daily. 10/31/22   Orbie Pyo, MD  metoprolol succinate (TOPROL-XL) 50 MG 24 hr tablet Take 1.5 tablets (75 mg total) by mouth daily. Take with or immediately following a meal. 12/28/22   Lanier Prude, MD  Multiple Vitamin (MULTIVITAMIN WITH MINERALS) TABS tablet Take 1 tablet by mouth daily.    [provider]  Probiotic Product (ALIGN) 4 MG CAPS 1 capsule by mouth once a day    [provider]  pyridoxine (B-6) 500 MG tablet Take 1 tablet (500 mg total) by mouth daily. 02/24/22   Josph Macho, MD  rosuvastatin (CRESTOR) 20 MG tablet Take 1 tablet (20 mg total) by mouth daily. 04/22/21   Albertine Grates, MD  spironolactone (ALDACTONE) 25 MG tablet Take 0.5 tablets (12.5 mg total) by mouth daily. 02/03/23 05/04/23  Tereso Newcomer T, PA-C  Vitamin D, Ergocalciferol, (DRISDOL) 50000 UNITS CAPS Take 50,000 Units by mouth every Friday.    [provider]  VITAMIN E PO Take 1 capsule by mouth daily.    [provider]  Wheat Dextrin (BENEFIBER) POWD Take 1 daily dose 02/09/21   Tressia Danas, MD  zolpidem (AMBIEN CR) 12.5 MG CR tablet Take 12.5 mg by mouth at bedtime.    [provider]      Allergies    Patient has no known allergies.    Review of Systems  Review of Systems  Skin:  Positive for wound.    Physical Exam Updated Vital Signs BP (!) 142/92 (BP Location: Right Arm)   Pulse 77   Temp 98 F (36.7 C) (Oral)   Resp 16   Ht 5\' 1"  (1.549 m)   Wt 61.5 kg   SpO2 98%   BMI 25.62 kg/m  Physical Exam Vitals and nursing note reviewed.  Constitutional:      General: She is not in acute distress.    Appearance: She is well-developed.  HENT:     Head: Normocephalic. Laceration present. No raccoon eyes or Battle's sign.      Comments: Patient has 2 lacerations noted above approximately 2 cm in length each.  The more superior laceration is noted to have a hematoma.  Patient is neurovascularly intact. Eyes:      Extraocular Movements: Extraocular movements intact.     Conjunctiva/sclera: Conjunctivae normal.     Pupils: Pupils are equal, round, and reactive to light.  Cardiovascular:     Rate and Rhythm: Normal rate and regular rhythm.     Pulses: Normal pulses.     Heart sounds: Normal heart sounds. No murmur heard. Pulmonary:     Effort: Pulmonary effort is normal. No respiratory distress.     Breath sounds: Normal breath sounds.  Abdominal:     Palpations: Abdomen is soft.     Tenderness: There is no abdominal tenderness.  Musculoskeletal:        General: No swelling.     Cervical back: Neck supple.  Skin:    General: Skin is warm and dry.     Capillary Refill: Capillary refill takes less than 2 seconds.  Neurological:     Mental Status: She is alert. Mental status is at baseline.  Psychiatric:        Mood and Affect: Mood normal.     ED Results / Procedures / Treatments   Labs (all labs ordered are listed, but only abnormal results are displayed) Labs Reviewed - No data to display  EKG None  Radiology CT Head Wo Contrast Result Date: 05/15/2023 CLINICAL DATA:  Fall EXAM: CT HEAD WITHOUT CONTRAST TECHNIQUE: Contiguous axial images were obtained from the base of the skull through the vertex without intravenous contrast. RADIATION DOSE REDUCTION: This exam was performed according to the departmental dose-optimization program which includes automated exposure control, adjustment of the mA and/or kV according to patient size and/or use of iterative reconstruction technique. COMPARISON:  In 24 FINDINGS: Brain: No mass, hemorrhage or extra-axial collection. There is generalized volume loss. There is hypoattenuation of the bilateral supratentorial white matter. Vascular: No hyperdense vessel or unexpected calcification. Skull: Large superior midline scalp hematoma.  No skull fracture Sinuses/Orbits: No acute finding. Other: None. IMPRESSION: 1. No acute intracranial abnormality. 2. Large  superior midline scalp hematoma without skull fracture. 3. Findings of chronic small vessel ischemia and volume loss. Electronically Signed   By: Deatra Robinson M.D.   On: 05/15/2023 19:34    Procedures .Laceration Repair  Date/Time: 05/15/2023 10:55 PM  Performed by: Dolphus Jenny, PA-C Authorized by: Dolphus Jenny, PA-C   Consent:    Consent obtained:  Verbal   Consent given by:  Patient   Risks discussed:  Infection, retained foreign body, vascular damage and poor wound healing   Alternatives discussed:  No treatment Universal protocol:    Patient identity confirmed:  Verbally with patient Anesthesia:    Anesthesia method:  None Laceration details:  Location:  Scalp   Scalp location:  Crown   Length (cm):  2   Depth (mm):  2 Exploration:    Hemostasis achieved with:  Direct pressure   Imaging outcome: foreign body not noted     Wound exploration: wound explored through full range of motion and entire depth of wound visualized   Treatment:    Area cleansed with:  Shur-Clens   Amount of cleaning:  Standard Skin repair:    Repair method:  Staples   Number of staples:  4 Approximation:    Approximation:  Close Repair type:    Repair type:  Simple Post-procedure details:    Procedure completion:  Tolerated .Laceration Repair  Date/Time: 05/15/2023 10:56 PM  Performed by: Dolphus Jenny, PA-C Authorized by: Dolphus Jenny, PA-C   Consent:    Consent obtained:  Verbal   Consent given by:  Patient   Risks discussed:  Infection, retained foreign body and poor wound healing   Alternatives discussed:  No treatment Universal protocol:    Patient identity confirmed:  Verbally with patient Anesthesia:    Anesthesia method:  None Laceration details:    Location:  Scalp   Scalp location:  L temporal   Length (cm):  2   Depth (mm):  2 Exploration:    Hemostasis achieved with:  Direct pressure   Imaging outcome: foreign body not noted     Wound exploration: wound  explored through full range of motion and entire depth of wound visualized   Treatment:    Area cleansed with:  Shur-Clens   Amount of cleaning:  Extensive   Irrigation solution:  Sterile saline   Irrigation method:  Pressure wash Skin repair:    Repair method:  Staples   Number of staples:  3 Approximation:    Approximation:  Close Repair type:    Repair type:  Simple Post-procedure details:    Dressing:  Open (no dressing)   Procedure completion:  Tolerated     Medications Ordered in ED Medications - No data to display  ED Course/ Medical Decision Making/ A&P                                 Medical Decision Making Amount and/or Complexity of Data Reviewed Radiology: ordered.   This patient presents to the ED with chief complaint(s) of fall with pertinent past medical history of none which further complicates the presenting complaint. The complaint involves an extensive differential diagnosis and also carries with it a high risk of complications and morbidity.    The differential diagnosis includes brain bleed, musculoskeletal pain, lacerations  Additional history obtained: Additional history obtained from significant other  ED Course and Reassessment:    Independent visualization of imaging: - I independently visualized the following imaging with scope of interpretation limited to determining acute life threatening conditions related to emergency care: CT head Noncon, which revealed no acute intracranial abnormality, large superior midline scalp hematoma without skull fracture  Consultation: - Consulted or discussed management/test interpretation w/ external professional: None  Consideration for admission or further workup: Considered for mission for further workup however patient's vital signs, physical exam, and imaging have all been reassuring.  Patient's lacerations were repaired using staples and should have follow-up in approximately 1 week with her primary  care physician for removal.        Final Clinical Impression(s) / ED Diagnoses Final diagnoses:  Laceration of scalp,  initial encounter  Minor head injury, initial encounter    Rx / DC Orders ED Discharge Orders     None         Gretta Began 05/15/23 2300    Alvira Monday, MD 05/16/23 1218

## 2023-05-15 NOTE — ED Triage Notes (Addendum)
Mechanical Fall and hit posterior scalp on end table at approx 1630, 2 lacerations noted, bleeding controlled    No blood thinners, off plavix x 2 months  Takes asa 81mg 

## 2023-05-15 NOTE — Discharge Instructions (Signed)
You were seen for a head injury.  Please follow-up with your PCP in approximately 1 week to have your staples removed.  You may take Tylenol as needed for pain.  Thank you for letting us treat you today. After performing physical exam and reviewing your imaging, I feel you are safe to go home. Please follow up with your PCP in the next several days and provide them with your records from this visit. Return to the Emergency Room if pain becomes severe or symptoms worsen.

## 2023-05-17 DIAGNOSIS — E785 Hyperlipidemia, unspecified: Secondary | ICD-10-CM | POA: Diagnosis not present

## 2023-05-17 DIAGNOSIS — I129 Hypertensive chronic kidney disease with stage 1 through stage 4 chronic kidney disease, or unspecified chronic kidney disease: Secondary | ICD-10-CM | POA: Diagnosis not present

## 2023-05-17 DIAGNOSIS — Z1331 Encounter for screening for depression: Secondary | ICD-10-CM | POA: Diagnosis not present

## 2023-05-17 DIAGNOSIS — K219 Gastro-esophageal reflux disease without esophagitis: Secondary | ICD-10-CM | POA: Diagnosis not present

## 2023-05-17 DIAGNOSIS — Z0000 Encounter for general adult medical examination without abnormal findings: Secondary | ICD-10-CM | POA: Diagnosis not present

## 2023-05-17 DIAGNOSIS — E663 Overweight: Secondary | ICD-10-CM | POA: Diagnosis not present

## 2023-05-17 DIAGNOSIS — M810 Age-related osteoporosis without current pathological fracture: Secondary | ICD-10-CM | POA: Diagnosis not present

## 2023-05-17 DIAGNOSIS — G629 Polyneuropathy, unspecified: Secondary | ICD-10-CM | POA: Diagnosis not present

## 2023-05-17 DIAGNOSIS — Z1339 Encounter for screening examination for other mental health and behavioral disorders: Secondary | ICD-10-CM | POA: Diagnosis not present

## 2023-05-17 DIAGNOSIS — E039 Hypothyroidism, unspecified: Secondary | ICD-10-CM | POA: Diagnosis not present

## 2023-05-17 DIAGNOSIS — G4700 Insomnia, unspecified: Secondary | ICD-10-CM | POA: Diagnosis not present

## 2023-05-18 ENCOUNTER — Inpatient Hospital Stay: Payer: Medicare Other | Admitting: Hematology & Oncology

## 2023-05-18 ENCOUNTER — Encounter: Payer: Self-pay | Admitting: Hematology & Oncology

## 2023-05-18 ENCOUNTER — Inpatient Hospital Stay: Payer: Medicare Other | Attending: Hematology & Oncology

## 2023-05-18 VITALS — BP 135/82 | HR 71 | Temp 98.9°F | Resp 20

## 2023-05-18 DIAGNOSIS — M791 Myalgia, unspecified site: Secondary | ICD-10-CM | POA: Diagnosis not present

## 2023-05-18 DIAGNOSIS — Z79899 Other long term (current) drug therapy: Secondary | ICD-10-CM | POA: Diagnosis not present

## 2023-05-18 DIAGNOSIS — R296 Repeated falls: Secondary | ICD-10-CM | POA: Diagnosis not present

## 2023-05-18 DIAGNOSIS — R519 Headache, unspecified: Secondary | ICD-10-CM | POA: Diagnosis not present

## 2023-05-18 DIAGNOSIS — D472 Monoclonal gammopathy: Secondary | ICD-10-CM | POA: Diagnosis not present

## 2023-05-18 LAB — CBC WITH DIFFERENTIAL (CANCER CENTER ONLY)
Abs Immature Granulocytes: 0.07 10*3/uL (ref 0.00–0.07)
Basophils Absolute: 0 10*3/uL (ref 0.0–0.1)
Basophils Relative: 0 %
Eosinophils Absolute: 0.1 10*3/uL (ref 0.0–0.5)
Eosinophils Relative: 1 %
HCT: 39.8 % (ref 36.0–46.0)
Hemoglobin: 13 g/dL (ref 12.0–15.0)
Immature Granulocytes: 1 %
Lymphocytes Relative: 23 %
Lymphs Abs: 1.2 10*3/uL (ref 0.7–4.0)
MCH: 31.8 pg (ref 26.0–34.0)
MCHC: 32.7 g/dL (ref 30.0–36.0)
MCV: 97.3 fL (ref 80.0–100.0)
Monocytes Absolute: 0.6 10*3/uL (ref 0.1–1.0)
Monocytes Relative: 12 %
Neutro Abs: 3.2 10*3/uL (ref 1.7–7.7)
Neutrophils Relative %: 63 %
Platelet Count: 287 10*3/uL (ref 150–400)
RBC: 4.09 MIL/uL (ref 3.87–5.11)
RDW: 12.2 % (ref 11.5–15.5)
WBC Count: 5.1 10*3/uL (ref 4.0–10.5)
nRBC: 0 % (ref 0.0–0.2)

## 2023-05-18 LAB — CMP (CANCER CENTER ONLY)
ALT: 19 U/L (ref 0–44)
AST: 26 U/L (ref 15–41)
Albumin: 4.5 g/dL (ref 3.5–5.0)
Alkaline Phosphatase: 76 U/L (ref 38–126)
Anion gap: 8 (ref 5–15)
BUN: 14 mg/dL (ref 8–23)
CO2: 29 mmol/L (ref 22–32)
Calcium: 10.1 mg/dL (ref 8.9–10.3)
Chloride: 97 mmol/L — ABNORMAL LOW (ref 98–111)
Creatinine: 0.8 mg/dL (ref 0.44–1.00)
GFR, Estimated: >60 mL/min (ref >60)
Glucose, Bld: 150 mg/dL — ABNORMAL HIGH (ref 70–99)
Potassium: 4 mmol/L (ref 3.5–5.1)
Sodium: 134 mmol/L — ABNORMAL LOW (ref 135–145)
Total Bilirubin: 0.4 mg/dL (ref <1.2)
Total Protein: 7 g/dL (ref 6.5–8.1)

## 2023-05-18 LAB — LACTATE DEHYDROGENASE: LDH: 176 U/L (ref 98–192)

## 2023-05-18 NOTE — Progress Notes (Signed)
Hematology and Oncology Follow Up Visit  [name] [id number] 08/14/1933 89 y.o. 05/18/2023   Principle Diagnosis:  IgA Kappa MGUS  Current Therapy:   Observation     Interim History:  [name] is back for follow-up.  Unfortunately, she was in the ER this past Monday.  She had fallen at home.  She hit her head.  She did have a CT of the brain.  Thankfully, this does not show any bleed.  She has staples in the scalp.  I do say that she keeps having these issues.  I suspect that the neuropathy is probably causing a lot of the problems.  She may need to have a rolling [place] to get around with.  Over last saw her back in September, her monoclonal spike was 0.2 g/dL.  Her IgA level was 310 mg/dL.  The Kappa light chain was 3.0 mg/dL.  She has had no problem with infections.  Has been no problems with nausea or vomiting.  She has had no change in bowel or bladder habits.  She has had no rashes.  There has been no swollen lymph nodes.  She has had no fever.  Overall, I would say performance status is probably ECOG 2.     Medications:  Current Outpatient Medications:    ALPRAZolam (XANAX) 0.5 MG tablet, Take 0.25 mg by mouth 2 (two) times daily as needed (restless legs)., Disp: , Rfl:    Ascorbic Acid (VITAMIN C PO), Take 1 tablet by mouth daily., Disp: , Rfl:    aspirin EC 81 MG tablet, Take 1 tablet (81 mg total) by mouth daily. Swallow whole., Disp: 30 tablet, Rfl: 12   Calcium Carbonate-Vitamin D (CALCIUM-D PO), Take 1 tablet by mouth daily., Disp: , Rfl:    celecoxib (CELEBREX) 200 MG capsule, Take 1 tablet by mouth as needed., Disp: , Rfl:    dexlansoprazole (DEXILANT) 60 MG capsule, Take 60 mg by mouth every morning., Disp: , Rfl:    escitalopram (LEXAPRO) 10 MG tablet, Take 10 mg by mouth at bedtime., Disp: , Rfl:    levothyroxine (SYNTHROID, LEVOTHROID) 25 MCG tablet, Take 25 mcg by mouth daily before breakfast. , Disp: , Rfl:    losartan (COZAAR) 50 MG tablet, Take 1  tablet (50 mg total) by mouth daily., Disp: 90 tablet, Rfl: 3   metoprolol succinate (TOPROL-XL) 50 MG 24 hr tablet, Take 1.5 tablets (75 mg total) by mouth daily. Take with or immediately following a meal., Disp: 135 tablet, Rfl: 3   Multiple Vitamin (MULTIVITAMIN WITH MINERALS) TABS tablet, Take 1 tablet by mouth daily., Disp: , Rfl:    Probiotic Product (ALIGN) 4 MG CAPS, 1 capsule by mouth once a day, Disp: , Rfl:    pyridoxine (B-6) 500 MG tablet, Take 1 tablet (500 mg total) by mouth daily., Disp: 30 tablet, Rfl: 5   rosuvastatin (CRESTOR) 20 MG tablet, Take 1 tablet (20 mg total) by mouth daily. (Patient taking differently: Take 20 mg by mouth. 05/18/2023 Takes 3 times per week.), Disp: 30 tablet, Rfl: 0   Vitamin D, Ergocalciferol, (DRISDOL) 50000 UNITS CAPS, Take 50,000 Units by mouth every Friday., Disp: , Rfl:    VITAMIN E PO, Take 1 capsule by mouth daily., Disp: , Rfl:    Wheat Dextrin (BENEFIBER) POWD, Take 1 daily dose, Disp: 500 g, Rfl: 0   zolpidem (AMBIEN CR) 12.5 MG CR tablet, Take 12.5 mg by mouth at bedtime., Disp: , Rfl:    amoxicillin (AMOXIL) 500  MG capsule, Take 2,000 mg by mouth as needed (dental). Take 4 capsules (2000 mg) by mouth one hour prior to dental appointments (Patient not taking: Reported on 05/18/2023), Disp: , Rfl:    spironolactone (ALDACTONE) 25 MG tablet, Take 0.5 tablets (12.5 mg total) by mouth daily., Disp: , Rfl:   Allergies: No Known Allergies  Past Medical History, Surgical history, Social history, and Family History were reviewed and updated.  Review of Systems: Review of Systems  Constitutional: Negative.  Negative for appetite change, fatigue, fever and unexpected weight change.  HENT:  Negative.  Negative for lump/mass, mouth sores, sore throat and trouble swallowing.   Eyes: Negative.   Respiratory: Negative.  Negative for cough, hemoptysis and shortness of breath.   Cardiovascular: Negative.  Negative for leg swelling and palpitations.   Gastrointestinal: Negative.  Negative for abdominal distention, abdominal pain, blood in stool, constipation, diarrhea, nausea and vomiting.  Endocrine: Negative.   Genitourinary: Negative.  Negative for bladder incontinence, dysuria, frequency and hematuria.   Musculoskeletal:  Positive for myalgias. Negative for arthralgias, back pain and gait problem.  Skin: Negative.  Negative for itching and rash.  Neurological:  Positive for headaches. Negative for dizziness, extremity weakness, gait problem, numbness, seizures and speech difficulty.  Hematological: Negative.  Does not bruise/bleed easily.  Psychiatric/Behavioral: Negative.  Negative for depression and sleep disturbance. The patient is not nervous/anxious.     Physical Exam:  oral temperature is 98.9 F (37.2 C). Her blood pressure is 135/82 and her pulse is 71. Her respiration is 20 and oxygen saturation is 98%.   Wt Readings from Last 3 Encounters:  05/15/23 135 lb 9.3 oz (61.5 kg)  02/20/23 135 lb 9.6 oz (61.5 kg)  02/16/23 133 lb 12.8 oz (60.7 kg)    Physical Exam Vitals reviewed.  HENT:     Head: Normocephalic and atraumatic.  Eyes:     Pupils: Pupils are equal, round, and reactive to light.  Cardiovascular:     Rate and Rhythm: Normal rate and regular rhythm.     Heart sounds: Normal heart sounds.  Pulmonary:     Effort: Pulmonary effort is normal.     Breath sounds: Normal breath sounds.  Abdominal:     General: Bowel sounds are normal.     Palpations: Abdomen is soft.  Musculoskeletal:        General: No tenderness or deformity. Normal range of motion.     Cervical back: Normal range of motion.  Lymphadenopathy:     Cervical: No cervical adenopathy.  Skin:    General: Skin is warm and dry.     Findings: No erythema or rash.  Neurological:     Mental Status: She is alert and oriented to person, place, and time.  Psychiatric:        Behavior: Behavior normal.        Thought Content: Thought content normal.         Judgment: Judgment normal.      Lab Results  Component Value Date   WBC 5.1 05/18/2023   HGB 13.0 05/18/2023   HCT 39.8 05/18/2023   MCV 97.3 05/18/2023   PLT 287 05/18/2023     Chemistry      Component Value Date/Time   NA 134 (L) 05/18/2023 1156   NA 132 (L) 02/10/2023 1159   K 4.0 05/18/2023 1156   CL 97 (L) 05/18/2023 1156   CO2 29 05/18/2023 1156   BUN 14 05/18/2023 1156   BUN 13 02/10/2023  1159   CREATININE 0.80 05/18/2023 1156      Component Value Date/Time   CALCIUM 10.1 05/18/2023 1156   ALKPHOS 76 05/18/2023 1156   AST 26 05/18/2023 1156   ALT 19 05/18/2023 1156   BILITOT 0.4 05/18/2023 1156      Impression and Plan: Ms. Traina is a 89 year old white female.  She has an IgA kappa MGUS.  This is still at a very low level.  This certainly could be from her age alone.  I really hate that she has these falling episodes.  Again, she has not broken any bones.  This is a blessing.  I know her family is watching out for her very closely.  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  For right now, I probably would just plan to get her back in another 3 or 4 months.  We will get her through the Winter.    Josph Macho, MD 12/19/20241:30 PM

## 2023-05-19 LAB — IGG, IGA, IGM
IgA: 323 mg/dL (ref 64–422)
IgG (Immunoglobin G), Serum: 717 mg/dL (ref 586–1602)
IgM (Immunoglobulin M), Srm: 82 mg/dL (ref 26–217)

## 2023-05-22 LAB — KAPPA/LAMBDA LIGHT CHAINS
Kappa free light chain: 26.5 mg/L — ABNORMAL HIGH (ref 3.3–19.4)
Kappa, lambda light chain ratio: 1.42 (ref 0.26–1.65)
Lambda free light chains: 18.6 mg/L (ref 5.7–26.3)

## 2023-05-23 DIAGNOSIS — Z4802 Encounter for removal of sutures: Secondary | ICD-10-CM | POA: Diagnosis not present

## 2023-05-24 LAB — PROTEIN ELECTROPHORESIS, SERUM, WITH REFLEX
A/G Ratio: 1.3 (ref 0.7–1.7)
Albumin ELP: 3.8 g/dL (ref 2.9–4.4)
Alpha-1-Globulin: 0.3 g/dL (ref 0.0–0.4)
Alpha-2-Globulin: 0.8 g/dL (ref 0.4–1.0)
Beta Globulin: 1 g/dL (ref 0.7–1.3)
Gamma Globulin: 0.8 g/dL (ref 0.4–1.8)
Globulin, Total: 2.9 g/dL (ref 2.2–3.9)
Total Protein ELP: 6.7 g/dL (ref 6.0–8.5)

## 2023-05-25 ENCOUNTER — Telehealth: Payer: Self-pay | Admitting: *Deleted

## 2023-05-25 NOTE — Telephone Encounter (Signed)
As noted below by Dr. Myna Hidalgo, I left a message for the patient informing her that there is no abnormal protein in the blood. This is great news! Thanks [employer].

## 2023-05-25 NOTE — Telephone Encounter (Signed)
-----   Message from Josph Macho sent at 05/25/2023  6:48 AM EST ----- Call - =there is no abnormal protein in the blood!!!   Great news!!   Cindee Lame

## 2023-06-02 DIAGNOSIS — M48062 Spinal stenosis, lumbar region with neurogenic claudication: Secondary | ICD-10-CM | POA: Diagnosis not present

## 2023-06-02 DIAGNOSIS — Z6826 Body mass index (BMI) 26.0-26.9, adult: Secondary | ICD-10-CM | POA: Diagnosis not present

## 2023-06-05 ENCOUNTER — Ambulatory Visit (INDEPENDENT_AMBULATORY_CARE_PROVIDER_SITE_OTHER): Payer: Medicare Other

## 2023-06-05 DIAGNOSIS — I639 Cerebral infarction, unspecified: Secondary | ICD-10-CM | POA: Diagnosis not present

## 2023-06-07 LAB — CUP PACEART REMOTE DEVICE CHECK
Date Time Interrogation Session: 20250105231313
Implantable Pulse Generator Implant Date: 20230407

## 2023-06-12 ENCOUNTER — Emergency Department (HOSPITAL_BASED_OUTPATIENT_CLINIC_OR_DEPARTMENT_OTHER): Payer: Medicare Other

## 2023-06-12 ENCOUNTER — Emergency Department (HOSPITAL_BASED_OUTPATIENT_CLINIC_OR_DEPARTMENT_OTHER)
Admission: EM | Admit: 2023-06-12 | Discharge: 2023-06-12 | Disposition: A | Discharge status: Home / Self Care | Payer: Medicare Other | Attending: Emergency Medicine | Admitting: Emergency Medicine

## 2023-06-12 ENCOUNTER — Encounter (HOSPITAL_BASED_OUTPATIENT_CLINIC_OR_DEPARTMENT_OTHER): Payer: Self-pay | Admitting: Emergency Medicine

## 2023-06-12 ENCOUNTER — Other Ambulatory Visit: Payer: Self-pay

## 2023-06-12 DIAGNOSIS — I110 Hypertensive heart disease with heart failure: Secondary | ICD-10-CM | POA: Diagnosis not present

## 2023-06-12 DIAGNOSIS — M4854XA Collapsed vertebra, not elsewhere classified, thoracic region, initial encounter for fracture: Secondary | ICD-10-CM | POA: Diagnosis not present

## 2023-06-12 DIAGNOSIS — M160 Bilateral primary osteoarthritis of hip: Secondary | ICD-10-CM | POA: Diagnosis not present

## 2023-06-12 DIAGNOSIS — I6782 Cerebral ischemia: Secondary | ICD-10-CM | POA: Diagnosis not present

## 2023-06-12 DIAGNOSIS — S32010A Wedge compression fracture of first lumbar vertebra, initial encounter for closed fracture: Secondary | ICD-10-CM | POA: Diagnosis not present

## 2023-06-12 DIAGNOSIS — I2510 Atherosclerotic heart disease of native coronary artery without angina pectoris: Secondary | ICD-10-CM | POA: Diagnosis not present

## 2023-06-12 DIAGNOSIS — Z23 Encounter for immunization: Secondary | ICD-10-CM | POA: Insufficient documentation

## 2023-06-12 DIAGNOSIS — Z7982 Long term (current) use of aspirin: Secondary | ICD-10-CM | POA: Insufficient documentation

## 2023-06-12 DIAGNOSIS — M5030 Other cervical disc degeneration, unspecified cervical region: Secondary | ICD-10-CM | POA: Diagnosis not present

## 2023-06-12 DIAGNOSIS — S32010D Wedge compression fracture of first lumbar vertebra, subsequent encounter for fracture with routine healing: Secondary | ICD-10-CM | POA: Diagnosis not present

## 2023-06-12 DIAGNOSIS — M47816 Spondylosis without myelopathy or radiculopathy, lumbar region: Secondary | ICD-10-CM | POA: Diagnosis not present

## 2023-06-12 DIAGNOSIS — S300XXA Contusion of lower back and pelvis, initial encounter: Secondary | ICD-10-CM | POA: Diagnosis not present

## 2023-06-12 DIAGNOSIS — I6523 Occlusion and stenosis of bilateral carotid arteries: Secondary | ICD-10-CM | POA: Diagnosis not present

## 2023-06-12 DIAGNOSIS — Z79899 Other long term (current) drug therapy: Secondary | ICD-10-CM | POA: Insufficient documentation

## 2023-06-12 DIAGNOSIS — Z043 Encounter for examination and observation following other accident: Secondary | ICD-10-CM | POA: Diagnosis not present

## 2023-06-12 DIAGNOSIS — Z981 Arthrodesis status: Secondary | ICD-10-CM | POA: Diagnosis not present

## 2023-06-12 DIAGNOSIS — M47812 Spondylosis without myelopathy or radiculopathy, cervical region: Secondary | ICD-10-CM | POA: Diagnosis not present

## 2023-06-12 DIAGNOSIS — M545 Low back pain, unspecified: Secondary | ICD-10-CM | POA: Diagnosis not present

## 2023-06-12 DIAGNOSIS — M4856XA Collapsed vertebra, not elsewhere classified, lumbar region, initial encounter for fracture: Secondary | ICD-10-CM | POA: Diagnosis not present

## 2023-06-12 DIAGNOSIS — S0990XA Unspecified injury of head, initial encounter: Secondary | ICD-10-CM | POA: Diagnosis not present

## 2023-06-12 DIAGNOSIS — S199XXA Unspecified injury of neck, initial encounter: Secondary | ICD-10-CM | POA: Diagnosis not present

## 2023-06-12 DIAGNOSIS — S0003XA Contusion of scalp, initial encounter: Secondary | ICD-10-CM | POA: Diagnosis not present

## 2023-06-12 DIAGNOSIS — W19XXXA Unspecified fall, initial encounter: Secondary | ICD-10-CM | POA: Insufficient documentation

## 2023-06-12 DIAGNOSIS — I509 Heart failure, unspecified: Secondary | ICD-10-CM | POA: Insufficient documentation

## 2023-06-12 MED ORDER — OXYCODONE-ACETAMINOPHEN 5-325 MG PO TABS
1.0000 | ORAL_TABLET | Freq: Once | ORAL | Status: AC
  Administered 2023-06-12: 1 via ORAL
  Filled 2023-06-12: qty 1

## 2023-06-12 MED ORDER — TETANUS-DIPHTH-ACELL PERTUSSIS 5-2.5-18.5 LF-MCG/0.5 IM SUSY
0.5000 mL | PREFILLED_SYRINGE | Freq: Once | INTRAMUSCULAR | Status: AC
  Administered 2023-06-12: 0.5 mL via INTRAMUSCULAR
  Filled 2023-06-12: qty 0.5

## 2023-06-12 MED ORDER — HYDROCODONE-ACETAMINOPHEN 5-325 MG PO TABS: 1.0000 | ORAL_TABLET | Freq: Four times a day (QID) | ORAL | 0 refills | Status: DC | PRN

## 2023-06-12 NOTE — ED Triage Notes (Signed)
 Mechanical fall at 4 am , Hx multiple falls due to her imbalance .  Occipital hematoma , obvious dry blood . Reports headache neck p[ain and buttocks pain  . On Aspirin daily .  Alert and oriented x 4 .

## 2023-06-12 NOTE — Discharge Instructions (Addendum)
 Please use Tylenol  or ibuprofen for pain.  You may use 600 mg ibuprofen every 6 hours or 1000 mg of Tylenol  every 6 hours.  You may choose to alternate between the 2.  This would be most effective.  Not to exceed 4 g of Tylenol  within 24 hours.  Not to exceed 3200 mg ibuprofen 24 hours.  You can use the stronger narcotic pain medication for severe breakthrough pain, I would only take it at night if you are not planning on walking any more.

## 2023-06-12 NOTE — ED Notes (Signed)
 D/c paperwork reviewed with pt, including prescriptions and follow up care.  All questions and/or concerns addressed at time of d/c.  No further needs expressed. . Pt verbalized understanding, Wheeled by ED staff to ED exit, NAD.

## 2023-06-12 NOTE — ED Provider Notes (Signed)
 [name] Provider Note   CSN: [id number] Arrival date & time: 06/12/23  1337     History  Chief Complaint  Patient presents with   [name] is a 89 y.o. female with past medical history significant for CAD, hyperlipidemia, hypertension, heart failure, frequent falls secondary to poor balance from previous stroke who presents with concern for mechanical, nonsyncopal fall this morning at 4 AM.  She reports that she fell backwards and struck the back of her head.  She denies loss of consciousness.  She endorses some pain in the back of the head, neck, low back and buttocks.  She reports that she had tailbone injury from somewhat recent previous fall.   Fall       Home Medications Prior to Admission medications   Medication Sig Start Date End Date Taking? Authorizing Provider  HYDROcodone -acetaminophen  (NORCO/VICODIN) 5-325 MG tablet Take 1 tablet by mouth every 6 (six) hours as needed. 06/12/23  Yes Alejandro Adcox H, PA-C  ALPRAZolam  (XANAX ) 0.5 MG tablet Take 0.25 mg by mouth 2 (two) times daily as needed (restless legs).    [provider]  amoxicillin  (AMOXIL ) 500 MG capsule Take 2,000 mg by mouth as needed (dental). Take 4 capsules (2000 mg) by mouth one hour prior to dental appointments Patient not taking: Reported on 05/18/2023 03/29/21   [provider]  Ascorbic Acid (VITAMIN C PO) Take 1 tablet by mouth daily.    [provider]  aspirin  EC 81 MG tablet Take 1 tablet (81 mg total) by mouth daily. Swallow whole. 02/20/23   Lelon Hamilton T, PA-C  Calcium  Carbonate-Vitamin D  (CALCIUM -D PO) Take 1 tablet by mouth daily.    [provider]  celecoxib  (CELEBREX ) 200 MG capsule Take 1 tablet by mouth as needed. 09/28/20   [provider]  dexlansoprazole  (DEXILANT ) 60 MG capsule Take 60 mg by mouth every morning.    [provider]  escitalopram  (LEXAPRO ) 10 MG  tablet Take 10 mg by mouth at bedtime.    [provider]  levothyroxine  (SYNTHROID , LEVOTHROID) 25 MCG tablet Take 25 mcg by mouth daily before breakfast.     [provider]  losartan  (COZAAR ) 50 MG tablet Take 1 tablet (50 mg total) by mouth daily. 10/31/22   Thukkani, Arun K, MD  metoprolol  succinate (TOPROL -XL) 50 MG 24 hr tablet Take 1.5 tablets (75 mg total) by mouth daily. Take with or immediately following a meal. 12/28/22   Cindie Ole DASEN, MD  Multiple Vitamin (MULTIVITAMIN WITH MINERALS) TABS tablet Take 1 tablet by mouth daily.    [provider]  Probiotic Product (ALIGN) 4 MG CAPS 1 capsule by mouth once a day    [provider]  pyridoxine  (B-6) 500 MG tablet Take 1 tablet (500 mg total) by mouth daily. 02/24/22   Timmy Maude SAUNDERS, MD  rosuvastatin  (CRESTOR ) 20 MG tablet Take 1 tablet (20 mg total) by mouth daily. Patient taking differently: Take 20 mg by mouth. 05/18/2023 Takes 3 times per week. 04/22/21   Jerri Keys, MD  spironolactone  (ALDACTONE ) 25 MG tablet Take 0.5 tablets (12.5 mg total) by mouth daily. 02/03/23 05/04/23  Lelon Hamilton T, PA-C  Vitamin D , Ergocalciferol , (DRISDOL ) 50000 UNITS CAPS Take 50,000 Units by mouth every Friday.    [provider]  VITAMIN E PO Take 1 capsule by mouth daily.    [provider]  Wheat Dextrin (BENEFIBER) POWD Take 1  daily dose 02/09/21   Eda Iha, MD  zolpidem  (AMBIEN  CR) 12.5 MG CR tablet Take 12.5 mg by mouth at bedtime.    [provider]      Allergies    Patient has no known allergies.    Review of Systems   Review of Systems  All other systems reviewed and are negative.   Physical Exam Updated Vital Signs BP (!) 167/69   Pulse 68   Temp 97.7 F (36.5 C)   Resp 17   Wt 58.5 kg   SpO2 95%   BMI 24.37 kg/m  Physical Exam Vitals and nursing note reviewed.  Constitutional:      General: She is not in acute distress.    Appearance: Normal  appearance.  HENT:     Head: Normocephalic and atraumatic.  Eyes:     General:        Right eye: No discharge.        Left eye: No discharge.  Cardiovascular:     Rate and Rhythm: Normal rate and regular rhythm.     Heart sounds: No murmur heard.    No friction rub. No gallop.  Pulmonary:     Effort: Pulmonary effort is normal.     Breath sounds: Normal breath sounds.  Abdominal:     General: Bowel sounds are normal.     Palpations: Abdomen is soft.  Musculoskeletal:     Comments: Mild tenderness to palpation in the cervical paraspinous muscles, no step-off, deformity, no significant midline spinal tenderness.  Intact range of motion to flexion, extension, lateral rotation of the neck.  Intact strength 5/5 of bilateral lower extremities.  Some lumbar paraspinous muscle tenderness.  Some tailbone tenderness.  She has not tenderness over the right great trochanter but no leg length discrepancy, no range of motion abnormality of bilateral lower extremities.  Skin:    General: Skin is warm and dry.     Capillary Refill: Capillary refill takes less than 2 seconds.  Neurological:     Mental Status: She is alert and oriented to person, place, and time.  Psychiatric:        Mood and Affect: Mood normal.        Behavior: Behavior normal.     ED Results / Procedures / Treatments   Labs (all labs ordered are listed, but only abnormal results are displayed) Labs Reviewed - No data to display  EKG None  Radiology CT Cervical Spine Wo Contrast Result Date: 06/12/2023 CLINICAL DATA:  Neck trauma (Age >= 65y) EXAM: CT CERVICAL SPINE WITHOUT CONTRAST TECHNIQUE: Multidetector CT imaging of the cervical spine was performed without intravenous contrast. Multiplanar CT image reconstructions were also generated. RADIATION DOSE REDUCTION: This exam was performed according to the departmental dose-optimization program which includes automated exposure control, adjustment of the mA and/or kV according  to patient size and/or use of iterative reconstruction technique. COMPARISON:  01/03/2023 FINDINGS: Alignment: Facet joints are aligned without dislocation or traumatic listhesis. Dens and lateral masses are aligned. Skull base and vertebrae: No acute fracture. No primary bone lesion or focal pathologic process. Soft tissues and spinal canal: No prevertebral fluid or swelling. No visible canal hematoma. Disc levels: Prior disc replacement at C5-6 and C6-7. Similar degree of degenerative disc disease and facet arthropathy of the remaining levels. Upper chest: Included lung apices are clear. Other: Bilateral carotid atherosclerosis. IMPRESSION: No acute fracture or traumatic listhesis of the cervical spine. Electronically Signed   By: Mabel Converse D.O.  On: 06/12/2023 16:19   CT Head Wo Contrast Result Date: 06/12/2023 CLINICAL DATA:  Head trauma, minor (Age >= 65y) EXAM: CT HEAD WITHOUT CONTRAST TECHNIQUE: Contiguous axial images were obtained from the base of the skull through the vertex without intravenous contrast. RADIATION DOSE REDUCTION: This exam was performed according to the departmental dose-optimization program which includes automated exposure control, adjustment of the mA and/or kV according to patient size and/or use of iterative reconstruction technique. COMPARISON:  05/15/2023 FINDINGS: Brain: No evidence of acute infarction, hemorrhage, hydrocephalus, extra-axial collection or mass lesion/mass effect. Patchy low-density changes within the periventricular and subcortical white matter most compatible with chronic microvascular ischemic change. Mild diffuse cerebral volume loss. Vascular: Atherosclerotic calcifications involving the large vessels of the skull base. No unexpected hyperdense vessel. Skull: Normal. Negative for fracture or focal lesion. Sinuses/Orbits: No acute finding. Other: Left occipital scalp swelling. IMPRESSION: 1. No acute intracranial findings. 2. Left occipital scalp  swelling. No underlying calvarial fracture. 3. Chronic microvascular ischemic change and cerebral volume loss. Electronically Signed   By: Mabel Converse D.O.   On: 06/12/2023 16:16   DG Lumbar Spine Complete Result Date: 06/12/2023 CLINICAL DATA:  Fall with low back pain and right hip pain. EXAM: LUMBAR SPINE - COMPLETE 4+ VIEW COMPARISON:  06/03/2022 FINDINGS: Subtle curvature of the lumbar spine convex right unchanged. Minimal grade 1 anterolisthesis of L3 on L4 unchanged. Moderate spondylosis throughout the lumbar spine. Posterior fusion hardware with pedicle screws at the L4-5 level intact and unchanged. Interbody fusion at the L4-5 level. Evidence multiple thoracic spine compression fractures post kyphoplasty unchanged. New moderate compression fracture of L1 compared to 06/03/2022 although age indeterminate. Remainder the exam is unchanged. IMPRESSION: 1. New moderate compression fracture of L1 compared to 06/03/2022, although age indeterminate. 2. Multiple thoracic spine compression fractures post kyphoplasty unchanged. 3. Moderate spondylosis throughout the lumbar spine. 4. Posterior fusion hardware at the L4-5 level intact and unchanged. Electronically Signed   By: Toribio Agreste M.D.   On: 06/12/2023 16:06   DG Hip Unilat W or Wo Pelvis 2-3 Views Right Result Date: 06/12/2023 CLINICAL DATA:  Fall. EXAM: DG HIP (WITH OR WITHOUT PELVIS) 2-3V RIGHT COMPARISON:  CT chest, abdomen, and pelvis dated January 03, 2023. FINDINGS: There is no evidence of acute hip fracture or dislocation. The sacroiliac joints and pubic symphysis appear anatomically aligned. Moderate degenerative changes of the bilateral hips with joint space narrowing superiorly and marginal osteophytosis. L4-L5 posterior fusion is noted. IMPRESSION: 1. No acute osseous abnormality. 2. Moderate osteoarthritis of the bilateral hips. Electronically Signed   By: Harrietta Sherry M.D.   On: 06/12/2023 16:02    Procedures Procedures     Medications Ordered in ED Medications  oxyCODONE -acetaminophen  (PERCOCET/ROXICET) 5-325 MG per tablet 1 tablet (1 tablet Oral Given 06/12/23 1423)  Tdap (BOOSTRIX ) injection 0.5 mL (0.5 mLs Intramuscular Given 06/12/23 1424)    ED Course/ Medical Decision Making/ A&P                                 Medical Decision Making Amount and/or Complexity of Data Reviewed Radiology: ordered.  Risk Prescription drug management.    This patient is a 89 y.o. female  who presents to the ED for concern of fall, head injury, neck and back pain.   Differential diagnoses prior to evaluation: The emergent differential diagnosis includes, but is not limited to,  epidural hematoma, subdural hematoma, skull fracture, subarachnoid  hemorrhage, unstable cervical spine fracture, concussion vs other MSK injury  . This is not an exhaustive differential.   Past Medical History / Co-morbidities / Social History: CAD, hyperlipidemia, hypertension, heart failure, frequent falls secondary to poor balance from previous stroke  Additional history: Chart reviewed. Pertinent results include: Reviewed lab work, imaging from previous emergency department visits  Physical Exam: Physical exam performed. The pertinent findings include: Patient with hematoma to left occipital scalp, she has some tenderness to palpation in the C-spine, L-spine paraspinous muscles, otherwise with no focal step-off, deformity or clear signs of injury.  She is neurovascularly intact throughout.  Lab Tests/Imaging studies: I personally interpreted labs/imaging and the pertinent results include: I independently interpreted CT of the head, C-spine, L-spine, and plain film radiograph of the hip on the right which showed no evidence of acute intracranial injury, unstable cervical spine fracture, or hip fracture..  Chronic arthritis of hips noted, probable subacute L1 compression fracture noted, new since previous evaluation on CT from January of  last year.  I agree with the radiologist interpretation.   Medications: I ordered medication including Percocet x 1 for acute pain, will discharge with very short course of Norco to only be used at hour of sleep, extensive precautions given for use to prevent any future falls..  I have reviewed the patients home medicines and have made adjustments as needed.   Disposition: After consideration of the diagnostic results and the patients response to treatment, I feel that patient is stable for discharge at this time, encouraged orthopedic follow up .   emergency department workup does not suggest an emergent condition requiring admission or immediate intervention beyond what has been performed at this time. The plan is: as above. The patient is safe for discharge and has been instructed to return immediately for worsening symptoms, change in symptoms or any other concerns.  Final Clinical Impression(s) / ED Diagnoses Final diagnoses:  Compression fracture of L1 vertebra with routine healing, subsequent encounter  Fall, initial encounter  Hematoma of scalp, initial encounter    Rx / DC Orders ED Discharge Orders          Ordered    HYDROcodone -acetaminophen  (NORCO/VICODIN) 5-325 MG tablet  Every 6 hours PRN        06/12/23 1635              Almadelia Looman, Amalga H, PA-C 06/12/23 1639    Ruthe Cornet, DO 06/12/23 2015

## 2023-06-15 DIAGNOSIS — M5416 Radiculopathy, lumbar region: Secondary | ICD-10-CM | POA: Diagnosis not present

## 2023-06-15 DIAGNOSIS — M5116 Intervertebral disc disorders with radiculopathy, lumbar region: Secondary | ICD-10-CM | POA: Diagnosis not present

## 2023-07-10 ENCOUNTER — Ambulatory Visit (INDEPENDENT_AMBULATORY_CARE_PROVIDER_SITE_OTHER): Payer: Medicare Other

## 2023-07-10 DIAGNOSIS — I639 Cerebral infarction, unspecified: Secondary | ICD-10-CM | POA: Diagnosis not present

## 2023-07-10 LAB — CUP PACEART REMOTE DEVICE CHECK
Date Time Interrogation Session: 20250209231426
Implantable Pulse Generator Implant Date: 20230407

## 2023-07-17 NOTE — Addendum Note (Signed)
 Addended by: Geralyn Flash D on: 07/17/2023 04:54 PM   Modules accepted: Orders

## 2023-07-17 NOTE — Progress Notes (Signed)
 [facility] Summary Report / Loop Recorder

## 2023-08-14 ENCOUNTER — Ambulatory Visit (INDEPENDENT_AMBULATORY_CARE_PROVIDER_SITE_OTHER): Payer: Medicare Other

## 2023-08-14 DIAGNOSIS — I639 Cerebral infarction, unspecified: Secondary | ICD-10-CM | POA: Diagnosis not present

## 2023-08-15 LAB — CUP PACEART REMOTE DEVICE CHECK
Date Time Interrogation Session: 20250316231031
Implantable Pulse Generator Implant Date: 20230407

## 2023-08-21 NOTE — Progress Notes (Signed)
 [facility] Summary Report / Loop Recorder

## 2023-08-23 NOTE — Progress Notes (Unsigned)
 Patient: [name] Date of Birth: [birth year]  Reason for Visit: Follow up History from: Patient, daughter  Primary Neurologist: Willis/Sethi   ASSESSMENT AND PLAN 89 y.o. year old female with history of cryptogenic stroke in November 2022, multiple embolic right brain and cerebellar infarcts.  Loop recorder placed April 2023, no A-fib detected thus far.  Switched from Plavix to aspirin in September 2024 due to falls, bleeding/bruising.  History of chronic IgA mugs peripheral neuropathy, chronic gait disorder, chronic back pain, post kyphoplasty March 2023 with Dr. Danielle Dess.  Has had multiple falls in the last 6 months, I can tell she has declined, her daughter accompanies her today.  -Check MRI of the brain to rule out stroke event contributing to recent falls.  She is quite concerned with recurrent falls, headache, memory -Continue aspirin 81 mg daily for secondary stroke prevention, discussed the importance of fall prevention while on aspirin -Strict management of vascular risk factors with a goal BP less than 130/90, A1c less than 7.0, LDL less than 70 for secondary stroke prevention -Not on any medication for peripheral neuropathy, has previously tried gabapentin, Lyrica, Cymbalta, Keppra, Effexor, compound cream -Continue to use compound cream from [facility] for neuropathy -Be careful with ambulation not to fall -Follow-up with me in 6 months or sooner if needed  HISTORY OF PRESENT ILLNESS: Today 08/24/23 Switched from Plavix to aspirin in September 2024 due to bleeding/bruising. LR has not shown any abnormality. Continues with falls, can go 6 weeks without falling, then fall few times in a week. Just lose her balance, goes backward and hits her head. Lives alone, daughter lives beside her. She is not driving much. Chronic back pain, ESI in Jan with Dr. Danielle Dess. Helped her back and balance until she fell and messed it up again. Can have ESI every 3 months. Neuropathy is "bad", her feet  and legs  to knees are stinging and burning. Using compound cream. She is worried about her brain, feels like her head doesn't feel like it used to. Feels her memory is not as good since hitting her head, head feels heavy, headaches since she has been falling. A lot of falls in December and January. Has done PT. Wears watch that notifies her family if she falls. In a wheelchair today due to distance but mostly uses [employer]. Doesn't have much energy, feels drained in the morning.   08/24/22 SS: Here today for follow-up.  Reviewed Dr. Myna Hidalgo note 06/22/22 still very low IgA kappa MGUS, could be from age-related. Does report sunburn feeling to feet and ache up to her knees, cannot tolerate traditional medications. Balance is unsteady, no recent falls. Her left shoulder has healed. Has a cane. Still living alone. She is driving. No AFIB from loop recorder. Remains on Plavix. Dr. Danielle Dess just gave her ESI lumbar, helped for 3 weeks. Feels doing well at her home, her daughter lives beside her. She has a compounded cream from [facility] she uses.  Update 02/23/22 SS: [name] is here today for follow-up.  Was previously seen by myself and Dr. Anne Hahn, last visit was in May 2022 for history of IgA MGUS, peripheral neuropathy, gait disorder.  Follows with oncology for IgA kappa MGUS.  Has not been able to tolerate multiple medications for neuropathy.  Has used topical cream with good benefit.  In November 2023 she presented to the ER with left lower extremity weakness, MRI showed multiple small acute infarcts in the right greater than left cerebral hemispheres, also  involving right cerebellum concerning for embolic process.  CT angiogram of the head and neck showed beaded appearance of both terminal carotids raising concern for fibromuscular dysplasia.  No A-fib was seen on cardiac monitor. On Plavix 75 mg daily.   In March 2023 Dr. Danielle Dess performed kyphoplasty for T12 compression fracture with good benefit.  Had a  fall 3 months ago fell broke left humerus. Her grandson suddenly passed away over Easter, he was helping to take care of her. She is living alone, she is driving. Since the stroke the left leg is still slightly weak in combination with the neuropathy. Is careful with walking, uses a cane. Home health just finished 1 month ago, she wore a sling. Sees Dr. Myna Hidalgo tomorrow, last saw in June, IgA kappa MGUS is still low level, could be age alone. Not on any medication for neuropathy, her legs burn, the cream doesn't work. Wears support socks.   BP up today here but was 122/72 at gynecologist earlier.   Generally sleeps well, sometimes feet wake her up burning, will use wet cool wash cloth. Ms. Ciccone is delightful.   HISTORY  Copied Dr. Pearlean Brownie 08/18/21 HPI: [name] is a 89 year old pleasant Caucasian lady seen today for office consultation visit for stroke.  History is obtained from the patient and review of electronic medical records and opossum reviewed pertinent available imaging films in PACS.  She  has a past medical history of Arthritis, Complication of anesthesia, Coronary artery disease, DI (detrusor instability), Fracture of vertebra, GERD (gastroesophageal reflux disease), History of hiatal hernia, Hypertension, Hypothyroidism, Menopausal symptoms, MGUS (monoclonal gammopathy of unknown significance) (12/27/2017), Osteoporosis, Peripheral neuropathy (12/27/2017), Peripheral neuropathy, Pneumonia, Restless leg syndrome, and Varicose veins.  She presented to [facility] emergency room on 04/19/2022 for evaluation of left lower extremity weakness.  This gradually worsened over the last 2 days.  She was barely able to walk on the day of presentation and unable to lift the leg off the bed when she tried to stand she fell.  MRI scan of the brain was obtained which showed multiple small acute infarcts in right greater than left cerebral hemispheres and also involving right cerebellum and multiple vascular  territories concerning for central embolic process.  CT angiogram of the brain and neck showed no large vessel stenosis or occlusion but there was beaded appearance of both terminal carotids raising concern for fibromuscular dysplasia.  Echocardiogram showed ejection fraction of 60 to 65%.  Left atrial size was not dilated.  LDL cholesterol was 63 mg percent and hemoglobin A1c was 5.3.  She subsequently had a 30-day heart monitor done and no paroxysmal A-fib was found.  Patient was discharged home and underwent home physical and occupational therapy for for 3 months and is able to ambulate well now.  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  She can walk short distances unassisted and uses a cane for outdoors and long distances.  The patient did have a fall in her bathtub and fractured some ribs.  She was seen in December 2022 and found to have significant right-sided hemorrhagic pleural effusion and 1.2 L but after radiology.  She was also found to have T12 compression fracture and she underwent acrylic balloon kyphoplasty by Dr. Danielle Dess on 08/02/2021 for relief of her back pain which has been refractory to medical therapies.  She states her back pain is much better now and she can ambulate quite well.  She has a longstanding history of peripheral  neuropathy with hypersensitivity in the stocking distribution and poor balance.  She has seen Dr. Anne Hahn for the same.  She states her neuropathy symptoms are unchanged.  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  REVIEW OF SYSTEMS: Out of a complete 14 system review of symptoms, the patient complains only of the following symptoms, and all other reviewed systems are negative.  See HPI  ALLERGIES: No Known Allergies  HOME MEDICATIONS: Outpatient Medications Prior to Visit  Medication Sig Dispense Refill   ALPRAZolam (XANAX) 0.5 MG tablet Take 0.25 mg by mouth 2 (two) times daily as needed  (restless legs).     Ascorbic Acid (VITAMIN C PO) Take 1 tablet by mouth daily.     aspirin EC 81 MG tablet Take 1 tablet (81 mg total) by mouth daily. Swallow whole. 30 tablet 12   Calcium Carbonate-Vitamin D (CALCIUM-D PO) Take 1 tablet by mouth daily.     celecoxib (CELEBREX) 200 MG capsule Take 1 tablet by mouth as needed.     dexlansoprazole (DEXILANT) 60 MG capsule Take 60 mg by mouth every morning.     escitalopram (LEXAPRO) 10 MG tablet Take 10 mg by mouth at bedtime.     levothyroxine (SYNTHROID, LEVOTHROID) 25 MCG tablet Take 25 mcg by mouth daily before breakfast.      losartan (COZAAR) 50 MG tablet Take 1 tablet (50 mg total) by mouth daily. 90 tablet 3   metoprolol succinate (TOPROL-XL) 50 MG 24 hr tablet Take 1.5 tablets (75 mg total) by mouth daily. Take with or immediately following a meal. 135 tablet 3   Multiple Vitamin (MULTIVITAMIN WITH MINERALS) TABS tablet Take 1 tablet by mouth daily.     Probiotic Product (ALIGN) 4 MG CAPS 1 capsule by mouth once a day     pyridoxine (B-6) 500 MG tablet Take 1 tablet (500 mg total) by mouth daily. 30 tablet 5   rosuvastatin (CRESTOR) 20 MG tablet Take 1 tablet (20 mg total) by mouth daily. (Patient taking differently: Take 20 mg by mouth. 05/18/2023 Takes 3 times per week.) 30 tablet 0   Vitamin D, Ergocalciferol, (DRISDOL) 50000 UNITS CAPS Take 50,000 Units by mouth every Friday.     VITAMIN E PO Take 1 capsule by mouth daily.     Wheat Dextrin (BENEFIBER) POWD Take 1 daily dose 500 g 0   zolpidem (AMBIEN CR) 12.5 MG CR tablet Take 12.5 mg by mouth at bedtime.     spironolactone (ALDACTONE) 25 MG tablet Take 0.5 tablets (12.5 mg total) by mouth daily.     amoxicillin (AMOXIL) 500 MG capsule Take 2,000 mg by mouth as needed (dental). Take 4 capsules (2000 mg) by mouth one hour prior to dental appointments (Patient not taking: Reported on 05/18/2023)     HYDROcodone-acetaminophen (NORCO/VICODIN) 5-325 MG tablet Take 1 tablet by mouth every  6 (six) hours as needed. 10 tablet 0   No facility-administered medications prior to visit.    PAST MEDICAL HISTORY: Past Medical History:  Diagnosis Date   Anxiety    Arthritis    Chronic headaches    hx - now just occasional HA per patient   Colon polyps    Complication of anesthesia    Elevated blood pressure after having last Kyphoplasty; pt stated "I was given Morphine and had to stay overnight"   Coronary artery disease    Depression    DI (detrusor instability)    Dilated cardiomyopathy (HCC)  10/04/2022   TTE 10/03/22: EF 42, mild MR, ascending aorta 38 mm   Fracture of vertebra    x 3   GERD (gastroesophageal reflux disease)    Heart failure with mildly reduced ejection fraction (HFmrEF) (HCC) 01/18/2023   - CCTA 10/07/2006: CAC score 1; LAD < 30% soft plaque   - Myoview 03/30/2015: EF 83, no ischemia; low risk  - TTE 04/21/2021: EF 60-65, no RWMA, Gr 1 DD, normal RVSF, trivial MR, trivial AI - TTE 10/03/22: EF 42, global HK, normal RVSF, mild MR, ascending aorta dilation (38 mm), RAP 3    History of blood transfusion 2011   w/ knee replacement surgery   History of hiatal hernia    small - does not cause any problems per patient   HLD (hyperlipidemia)    Hypertension    Hypothyroidism    Menopausal symptoms    MGUS (monoclonal gammopathy of unknown significance) 12/27/2017   IgA   Osteoporosis    Peripheral neuropathy 12/27/2017   Pneumonia    Restless leg syndrome    Stroke (HCC) 03/2021   mini strokes   TMJ click    Varicose veins     PAST SURGICAL HISTORY: Past Surgical History:  Procedure Laterality Date   ABDOMINAL HYSTERECTOMY  1992   TAH,BSO   BLADDER SUSPENSION  2011   CRYOMESH   CARPAL TUNNEL RELEASE Bilateral 1982   CHOLECYSTECTOMY  1992   COLONOSCOPY     EYE SURGERY Bilateral    Cataract removal   IR THORACENTESIS ASP PLEURAL SPACE W/IMG GUIDE  05/28/2021   KYPHOPLASTY     X 3   KYPHOPLASTY N/A 08/02/2021   Procedure: Thoracic Twelve  KYPHOPLASTY;  Surgeon: Barnett Abu, MD;  Location: [facility] OR;  Service: Neurosurgery;  Laterality: N/A;   LUMBAR LAMINECTOMY WITH COFLEX 1 LEVEL N/A 12/29/2015   Procedure: L3-4 Laminectomy with Coflex;  Surgeon: Barnett Abu, MD;  Location: [facility] ORS;  Service: Neurosurgery;  Laterality: N/A;  L3-4 Laminectomy with coflex   OOPHORECTOMY     BSO   REPLACEMENT TOTAL KNEE Bilateral 2008,  2011   RIGHT 2008, LEFT 2011   REVERSE SHOULDER ARTHROPLASTY Right 01/30/2020   Procedure: REVERSE SHOULDER ARTHROPLASTY;  Surgeon: Yolonda Kida, MD;  Location: [facility] ORS;  Service: Orthopedics;  Laterality: Right;  2.5 hrs   SPINAL FUSION  2011   VERTEBRAL SURGERY     T-8, T-10. T-12  DR Ilean Skill    FAMILY HISTORY: Family History  Problem Relation Age of Onset   Heart disease Mother    Heart disease Father    Stroke Father    Hypertension Sister    Diabetes Sister    Ovarian cancer Sister    Diabetes Sister    Rectal cancer Sister        ? Colon or rectal   Hyperlipidemia Sister    Hypertension Sister    Hyperlipidemia Sister    Hypertension Sister    Hypertension Brother    Heart disease Brother    Heart disease Brother    Hypertension Brother    Heart disease Brother    Heart disease Brother    Heart disease Brother    Heart disease Brother    Breast cancer Niece    Heart disease Son    Hyperlipidemia Son    Hyperlipidemia Daughter    Hypertension Daughter     SOCIAL HISTORY: Social History   Socioeconomic History   Marital status: Widowed    Spouse name:  Not on file   Number of children: 2   Years of education: 12   Highest education level: Not on file  Occupational History   Occupation: Retired  Tobacco Use   Smoking status: Never   Smokeless tobacco: Never  Vaping Use   Vaping status: Never Used  Substance and Sexual Activity   Alcohol use: No   Drug use: No   Sexual activity: Not Currently    Birth control/protection: Surgical    Comment: Hysterectomy   Other Topics Concern   Not on file  Social History Narrative   Lives  alone   Caffeine use: decaf only   Right handed    Social Drivers of Corporate investment banker Strain: Not on file  Food Insecurity: Not on file  Transportation Needs: Not on file  Physical Activity: Not on file  Stress: Not on file  Social Connections: Unknown (10/09/2021)   Received from [facility], [facility]   Social Network    Social Network: Not on file  Intimate Partner Violence: Unknown (08/31/2021)   Received from [employer], [facility]   HITS    Physically Hurt: Not on file    Insult or Talk Down To: Not on file    Threaten Physical Harm: Not on file    Scream or Curse: Not on file   PHYSICAL EXAM  Vitals:   08/24/23 1330  BP: 116/73  Pulse: 80  Weight: 128 lb 15.5 oz (58.5 kg)  Height: 5' (1.524 m)   Body mass index is 25.19 kg/m.  Generalized: Well developed, in no acute distress, seated in wheelchair, looks a little weaker than normal  Neurological examination  Mentation: Alert oriented to time, place, history taking. Follows all commands speech and language fluent.  Very pleasant, gives her own history Cranial nerve II-XII: Pupils were equal round reactive to light. Extraocular movements were full, visual field were full on confrontational test. Facial sensation and strength were normal. Head turning and shoulder shrug were normal and symmetric. Motor: The motor testing reveals 5 over 5 strength of all 4 extremities. Good symmetric motor tone is noted throughout.  Sensory: Sensory testing is intact to soft touch on all 4 extremities. No evidence of extinction is noted.  Coordination: Cerebellar testing reveals good finger-nose-finger and heel-to-shin bilaterally.  Gait and station: walking in room with 1 assist, drags her right foot slightly, reports due to club foot from polio, she drag it. Gait is unsteady, no Tews to rely on  Skin: Bruising, discoloration to lower legs,  thin skin  Reflexes: Deep tendon reflexes are symmetric   DIAGNOSTIC DATA (LABS, IMAGING, TESTING) - I reviewed patient records, labs, notes, testing and imaging myself where available.  Lab Results  Component Value Date   WBC 5.1 05/18/2023   HGB 13.0 05/18/2023   HCT 39.8 05/18/2023   MCV 97.3 05/18/2023   PLT 287 05/18/2023      Component Value Date/Time   NA 134 (L) 05/18/2023 1156   NA 132 (L) 02/10/2023 1159   K 4.0 05/18/2023 1156   CL 97 (L) 05/18/2023 1156   CO2 29 05/18/2023 1156   GLUCOSE 150 (H) 05/18/2023 1156   BUN 14 05/18/2023 1156   BUN 13 02/10/2023 1159   CREATININE 0.80 05/18/2023 1156   CALCIUM 10.1 05/18/2023 1156   PROT 7.0 05/18/2023 1156   PROT 6.6 11/23/2017 0852   ALBUMIN 4.5 05/18/2023 1156   AST 26 05/18/2023 1156   ALT 19 05/18/2023 1156  ALKPHOS 76 05/18/2023 1156   BILITOT 0.4 05/18/2023 1156   GFRNONAA >60 05/18/2023 1156   GFRAA 55 (L) 01/22/2020 1151   GFRAA >60 12/06/2019 0949   Lab Results  Component Value Date   CHOL 147 10/26/2022   HDL 89 10/26/2022   LDLCALC 44 10/26/2022   TRIG 75 10/26/2022   CHOLHDL 1.7 10/26/2022   Lab Results  Component Value Date   HGBA1C 5.3 04/21/2021   Lab Results  Component Value Date   VITAMINB12 2,378 (H) 08/13/2018   Lab Results  Component Value Date   TSH 1.522 04/21/2021    Margie Ege, AGNP-C, DNP 08/24/2023, 1:47 PM [facility] [address], [address] [place], [place] [address] [phone]

## 2023-08-24 ENCOUNTER — Ambulatory Visit: Payer: Medicare Other | Admitting: Neurology

## 2023-08-24 ENCOUNTER — Encounter: Payer: Self-pay | Admitting: Neurology

## 2023-08-24 VITALS — BP 116/73 | HR 80 | Ht 60.0 in | Wt 129.0 lb

## 2023-08-24 DIAGNOSIS — R296 Repeated falls: Secondary | ICD-10-CM

## 2023-08-24 DIAGNOSIS — Z8673 Personal history of transient ischemic attack (TIA), and cerebral infarction without residual deficits: Secondary | ICD-10-CM

## 2023-08-24 DIAGNOSIS — D472 Monoclonal gammopathy: Secondary | ICD-10-CM

## 2023-08-24 DIAGNOSIS — G603 Idiopathic progressive neuropathy: Secondary | ICD-10-CM

## 2023-08-24 NOTE — Patient Instructions (Addendum)
 Check MRI brain today  Continue current medications  Be very careful with walking, don't fall  Follow up in 6 months

## 2023-08-29 ENCOUNTER — Telehealth: Payer: Self-pay | Admitting: Neurology

## 2023-08-29 ENCOUNTER — Encounter: Payer: Self-pay | Admitting: Neurology

## 2023-08-29 NOTE — Telephone Encounter (Signed)
 no auth required sent to [facility] [phone]

## 2023-08-31 ENCOUNTER — Inpatient Hospital Stay: Payer: Medicare Other | Attending: Hematology & Oncology

## 2023-08-31 ENCOUNTER — Inpatient Hospital Stay (HOSPITAL_BASED_OUTPATIENT_CLINIC_OR_DEPARTMENT_OTHER): Payer: Medicare Other | Admitting: Hematology & Oncology

## 2023-08-31 ENCOUNTER — Encounter: Payer: Self-pay | Admitting: Hematology & Oncology

## 2023-08-31 ENCOUNTER — Other Ambulatory Visit: Payer: Self-pay

## 2023-08-31 ENCOUNTER — Inpatient Hospital Stay

## 2023-08-31 VITALS — BP 189/96 | HR 79 | Temp 98.1°F | Resp 16 | Ht 60.0 in | Wt 125.0 lb

## 2023-08-31 DIAGNOSIS — R410 Disorientation, unspecified: Secondary | ICD-10-CM

## 2023-08-31 DIAGNOSIS — D472 Monoclonal gammopathy: Secondary | ICD-10-CM | POA: Insufficient documentation

## 2023-08-31 DIAGNOSIS — R519 Headache, unspecified: Secondary | ICD-10-CM | POA: Diagnosis not present

## 2023-08-31 DIAGNOSIS — R748 Abnormal levels of other serum enzymes: Secondary | ICD-10-CM | POA: Diagnosis not present

## 2023-08-31 DIAGNOSIS — Z79899 Other long term (current) drug therapy: Secondary | ICD-10-CM | POA: Diagnosis not present

## 2023-08-31 DIAGNOSIS — R1110 Vomiting, unspecified: Secondary | ICD-10-CM | POA: Diagnosis not present

## 2023-08-31 LAB — CBC WITH DIFFERENTIAL (CANCER CENTER ONLY)
Abs Immature Granulocytes: 0.02 10*3/uL (ref 0.00–0.07)
Basophils Absolute: 0 10*3/uL (ref 0.0–0.1)
Basophils Relative: 0 %
Eosinophils Absolute: 0 10*3/uL (ref 0.0–0.5)
Eosinophils Relative: 1 %
HCT: 42 % (ref 36.0–46.0)
Hemoglobin: 14.3 g/dL (ref 12.0–15.0)
Immature Granulocytes: 0 %
Lymphocytes Relative: 23 %
Lymphs Abs: 1.5 10*3/uL (ref 0.7–4.0)
MCH: 32.4 pg (ref 26.0–34.0)
MCHC: 34 g/dL (ref 30.0–36.0)
MCV: 95.2 fL (ref 80.0–100.0)
Monocytes Absolute: 0.6 10*3/uL (ref 0.1–1.0)
Monocytes Relative: 10 %
Neutro Abs: 4.3 10*3/uL (ref 1.7–7.7)
Neutrophils Relative %: 66 %
Platelet Count: 285 10*3/uL (ref 150–400)
RBC: 4.41 MIL/uL (ref 3.87–5.11)
RDW: 13.3 % (ref 11.5–15.5)
WBC Count: 6.5 10*3/uL (ref 4.0–10.5)
nRBC: 0 % (ref 0.0–0.2)

## 2023-08-31 LAB — CMP (CANCER CENTER ONLY)
ALT: 25 U/L (ref 0–44)
AST: 27 U/L (ref 15–41)
Albumin: 4.8 g/dL (ref 3.5–5.0)
Alkaline Phosphatase: 60 U/L (ref 38–126)
Anion gap: 9 (ref 5–15)
BUN: 11 mg/dL (ref 8–23)
CO2: 28 mmol/L (ref 22–32)
Calcium: 9.9 mg/dL (ref 8.9–10.3)
Chloride: 89 mmol/L — ABNORMAL LOW (ref 98–111)
Creatinine: 0.57 mg/dL (ref 0.44–1.00)
GFR, Estimated: >60 mL/min (ref >60)
Glucose, Bld: 128 mg/dL — ABNORMAL HIGH (ref 70–99)
Potassium: 3.7 mmol/L (ref 3.5–5.1)
Sodium: 126 mmol/L — ABNORMAL LOW (ref 135–145)
Total Bilirubin: 0.8 mg/dL (ref 0.0–1.2)
Total Protein: 7.4 g/dL (ref 6.5–8.1)

## 2023-08-31 LAB — TSH: TSH: 1.123 u[IU]/mL (ref 0.350–4.500)

## 2023-08-31 NOTE — Progress Notes (Signed)
 Hematology and Oncology Follow Up Visit  [name] [id number] 04-22-1934 89 y.o. 08/31/2023   Principle Diagnosis:  IgA Kappa MGUS  Current Therapy:   Observation     Interim History:  Ms. [name] is back for follow-up.  Unfortunately, she continues to have problems.  She has been falling.  Since last we saw, I think she is fallen several times.  She has seen Neurology.  They want to do an MRI of her brain.  This is going to be done tomorrow.  Her blood pressure is sky high.  We did an EKG on her.  There is no obvious changes on the EKG that suggested cardiac ischemia.  She was having some chest discomfort.  There is no shortness of breath.  Again, I am not sure how much she really is taken with all of her medications.  I told her that she needs to take the losartan twice a day (50 mg p.o. twice daily) and she really needs to see her family doctor.  As far as the MGUS is concerned, this really is not a problem.  There was no monoclonal spike when we last saw her.  Her IgA level was 323 mg/dL.  The kappa light chain was 2.7 mg/dL.  She did have an episode of vomiting this morning.  She has little bit of a headache.  She denies any kind of visual changes.  There is no problem with bowel or bladder.  Overall, I would have to set her performance status ECOG 2.  She   Medications:  Current Outpatient Medications:    ALPRAZolam (XANAX) 0.5 MG tablet, Take 0.25 mg by mouth 2 (two) times daily as needed (restless legs)., Disp: , Rfl:    Ascorbic Acid (VITAMIN C PO), Take 1 tablet by mouth daily., Disp: , Rfl:    aspirin EC 81 MG tablet, Take 1 tablet (81 mg total) by mouth daily. Swallow whole., Disp: 30 tablet, Rfl: 12   Calcium Carbonate-Vitamin D (CALCIUM-D PO), Take 1 tablet by mouth daily., Disp: , Rfl:    celecoxib (CELEBREX) 200 MG capsule, Take 1 tablet by mouth as needed., Disp: , Rfl:    dexlansoprazole (DEXILANT) 60 MG capsule, Take 60 mg by mouth every morning., Disp: ,  Rfl:    escitalopram (LEXAPRO) 10 MG tablet, Take 10 mg by mouth at bedtime., Disp: , Rfl:    levothyroxine (SYNTHROID, LEVOTHROID) 25 MCG tablet, Take 25 mcg by mouth daily before breakfast. , Disp: , Rfl:    losartan (COZAAR) 50 MG tablet, Take 1 tablet (50 mg total) by mouth daily., Disp: 90 tablet, Rfl: 3   metoprolol succinate (TOPROL-XL) 50 MG 24 hr tablet, Take 1.5 tablets (75 mg total) by mouth daily. Take with or immediately following a meal., Disp: 135 tablet, Rfl: 3   Multiple Vitamin (MULTIVITAMIN WITH MINERALS) TABS tablet, Take 1 tablet by mouth daily., Disp: , Rfl:    Probiotic Product (ALIGN) 4 MG CAPS, 1 capsule by mouth once a day, Disp: , Rfl:    pyridoxine (B-6) 500 MG tablet, Take 1 tablet (500 mg total) by mouth daily., Disp: 30 tablet, Rfl: 5   rosuvastatin (CRESTOR) 20 MG tablet, Take 1 tablet (20 mg total) by mouth daily. (Patient taking differently: Take 20 mg by mouth. 05/18/2023 Takes 3 times per week.), Disp: 30 tablet, Rfl: 0   spironolactone (ALDACTONE) 25 MG tablet, Take 0.5 tablets (12.5 mg total) by mouth daily., Disp: , Rfl:    Vitamin D, Ergocalciferol, (  DRISDOL) 50000 UNITS CAPS, Take 50,000 Units by mouth every Friday., Disp: , Rfl:    VITAMIN E PO, Take 1 capsule by mouth daily., Disp: , Rfl:    Wheat Dextrin (BENEFIBER) POWD, Take 1 daily dose, Disp: 500 g, Rfl: 0   zolpidem (AMBIEN CR) 12.5 MG CR tablet, Take 12.5 mg by mouth at bedtime., Disp: , Rfl:   Allergies: No Known Allergies  Past Medical History, Surgical history, Social history, and Family History were reviewed and updated.  Review of Systems: Review of Systems  Constitutional: Negative.  Negative for appetite change, fatigue, fever and unexpected weight change.  HENT:  Negative.  Negative for lump/mass, mouth sores, sore throat and trouble swallowing.   Eyes: Negative.   Respiratory: Negative.  Negative for cough, hemoptysis and shortness of breath.   Cardiovascular: Negative.  Negative for  leg swelling and palpitations.  Gastrointestinal: Negative.  Negative for abdominal distention, abdominal pain, blood in stool, constipation, diarrhea, nausea and vomiting.  Endocrine: Negative.   Genitourinary: Negative.  Negative for bladder incontinence, dysuria, frequency and hematuria.   Musculoskeletal:  Positive for myalgias. Negative for arthralgias, back pain and gait problem.  Skin: Negative.  Negative for itching and rash.  Neurological:  Positive for headaches. Negative for dizziness, extremity weakness, gait problem, numbness, seizures and speech difficulty.  Hematological: Negative.  Does not bruise/bleed easily.  Psychiatric/Behavioral: Negative.  Negative for depression and sleep disturbance. The patient is not nervous/anxious.     Physical Exam: Temperature is 98.1.  Pulse 79.  Blood pressure 190/102.  Repeated it was 189/96.  Weight is 125 pounds.  Wt Readings from Last 3 Encounters:  08/24/23 128 lb 15.5 oz (58.5 kg)  06/12/23 129 lb (58.5 kg)  05/15/23 135 lb 9.3 oz (61.5 kg)    Physical Exam Vitals reviewed.  HENT:     Head: Normocephalic and atraumatic.  Eyes:     Pupils: Pupils are equal, round, and reactive to light.  Cardiovascular:     Rate and Rhythm: Normal rate and regular rhythm.     Heart sounds: Normal heart sounds.  Pulmonary:     Effort: Pulmonary effort is normal.     Breath sounds: Normal breath sounds.  Abdominal:     General: Bowel sounds are normal.     Palpations: Abdomen is soft.  Musculoskeletal:        General: No tenderness or deformity. Normal range of motion.     Cervical back: Normal range of motion.  Lymphadenopathy:     Cervical: No cervical adenopathy.  Skin:    General: Skin is warm and dry.     Findings: No erythema or rash.  Neurological:     Mental Status: She is alert and oriented to person, place, and time.  Psychiatric:        Behavior: Behavior normal.        Thought Content: Thought content normal.         Judgment: Judgment normal.     Lab Results  Component Value Date   WBC 5.1 05/18/2023   HGB 13.0 05/18/2023   HCT 39.8 05/18/2023   MCV 97.3 05/18/2023   PLT 287 05/18/2023     Chemistry      Component Value Date/Time   NA 134 (L) 05/18/2023 1156   NA 132 (L) 02/10/2023 1159   K 4.0 05/18/2023 1156   CL 97 (L) 05/18/2023 1156   CO2 29 05/18/2023 1156   BUN 14 05/18/2023 1156   BUN 13  02/10/2023 1159   CREATININE 0.80 05/18/2023 1156      Component Value Date/Time   CALCIUM 10.1 05/18/2023 1156   ALKPHOS 76 05/18/2023 1156   AST 26 05/18/2023 1156   ALT 19 05/18/2023 1156   BILITOT 0.4 05/18/2023 1156      Impression and Plan: Ms. Rommel is a 89 year old white female.  She has an IgA kappa MGUS.  This is still at a very low level.  This certainly could be from her age alone.  Again, I think she has other episodes that can be a whole lot more important than this MGUS.  She is to have the MRI tomorrow.  It will be interesting to see what that shows.  Hopefully, doubling up on the losartan will help.  Her renal function is okay so she could tolerate this.  I told her that her family doctor really needs to be made aware of her blood pressure.  She does take her blood pressure at home.  I am glad that the EKG looked okay.  I do not see any changes that suggested ischemia.  I would like to see her back probably in about 6 weeks just because of the other issues that are going on with her.    Josph Macho, MD 4/3/20251:50 PM

## 2023-09-01 ENCOUNTER — Ambulatory Visit
Admission: RE | Admit: 2023-09-01 | Discharge: 2023-09-01 | Disposition: A | Discharge status: Home / Self Care | Source: Ambulatory Visit | Attending: Neurology | Admitting: Neurology

## 2023-09-01 ENCOUNTER — Telehealth: Payer: Self-pay | Admitting: *Deleted

## 2023-09-01 DIAGNOSIS — I639 Cerebral infarction, unspecified: Secondary | ICD-10-CM

## 2023-09-01 DIAGNOSIS — I493 Ventricular premature depolarization: Secondary | ICD-10-CM | POA: Diagnosis not present

## 2023-09-01 DIAGNOSIS — I10 Essential (primary) hypertension: Secondary | ICD-10-CM | POA: Diagnosis not present

## 2023-09-01 DIAGNOSIS — Z8673 Personal history of transient ischemic attack (TIA), and cerebral infarction without residual deficits: Secondary | ICD-10-CM

## 2023-09-01 DIAGNOSIS — I2510 Atherosclerotic heart disease of native coronary artery without angina pectoris: Secondary | ICD-10-CM | POA: Diagnosis not present

## 2023-09-01 DIAGNOSIS — I420 Dilated cardiomyopathy: Secondary | ICD-10-CM | POA: Diagnosis not present

## 2023-09-01 LAB — IGG, IGA, IGM
IgA: 362 mg/dL (ref 64–422)
IgG (Immunoglobin G), Serum: 734 mg/dL (ref 586–1602)
IgM (Immunoglobulin M), Srm: 94 mg/dL (ref 26–217)

## 2023-09-01 LAB — KAPPA/LAMBDA LIGHT CHAINS
Kappa free light chain: 19 mg/L (ref 3.3–19.4)
Kappa, lambda light chain ratio: 1.27 (ref 0.26–1.65)
Lambda free light chains: 15 mg/L (ref 5.7–26.3)

## 2023-09-01 NOTE — Telephone Encounter (Signed)
-----   Message from Josph Macho sent at 09/01/2023 11:40 AM EDT ----- Please call and let him know that the thyroid level is okay.  Cindee Lame

## 2023-09-01 NOTE — Telephone Encounter (Signed)
 As noted below by Dr. Myna Hidalgo, I left a message informing the patient that the thyroid level is OK. Instructed her to call if she had any questions or concerns.

## 2023-09-06 ENCOUNTER — Telehealth: Payer: Self-pay | Admitting: Neurology

## 2023-09-06 NOTE — Telephone Encounter (Signed)
 Called the patient and offered the 4/16 opening with Dr Pearlean Brownie at 2:30 pm. She will reach out to her daughter and if that is a problem, she will call back and let us know.

## 2023-09-06 NOTE — Telephone Encounter (Signed)
 I called the patient, MRI didn't show any acute stroke. 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 She continues with gait instability, falls. Her BP was high, losartan was increased and her headaches are better. Patient has really declined in her mobility and she is falling a lot. I'd like to get her in to see Dr. Pearlean Brownie. Reviewed with Dr.Yan who agrees, with her history of spine injury may need further workup.

## 2023-09-08 ENCOUNTER — Other Ambulatory Visit

## 2023-09-11 LAB — PROTEIN ELECTROPHORESIS, SERUM, WITH REFLEX
A/G Ratio: 1.4 (ref 0.7–1.7)
Albumin ELP: 4.1 g/dL (ref 2.9–4.4)
Alpha-1-Globulin: 0.3 g/dL (ref 0.0–0.4)
Alpha-2-Globulin: 0.8 g/dL (ref 0.4–1.0)
Beta Globulin: 1.1 g/dL (ref 0.7–1.3)
Gamma Globulin: 0.8 g/dL (ref 0.4–1.8)
Globulin, Total: 3 g/dL (ref 2.2–3.9)
M-Spike, %: 0.2 g/dL — ABNORMAL HIGH
SPEP Interpretation: 0
Total Protein ELP: 7.1 g/dL (ref 6.0–8.5)

## 2023-09-11 LAB — IMMUNOFIXATION REFLEX, SERUM
IgA: 362 mg/dL (ref 64–422)
IgG (Immunoglobin G), Serum: 744 mg/dL (ref 586–1602)
IgM (Immunoglobulin M), Srm: 84 mg/dL (ref 26–217)

## 2023-09-12 ENCOUNTER — Telehealth: Payer: Self-pay | Admitting: *Deleted

## 2023-09-12 NOTE — Telephone Encounter (Signed)
 As noted below by Dr. Maria Shiner, I left a message stating that everything still looks very stable with the monoclonal protein. I am not worried about anything with this. Instructed her to call the office if she has any questions or concerns.

## 2023-09-12 NOTE — Telephone Encounter (Signed)
-----   Message from [name] sent at 09/12/2023  7:01 AM EDT ----- Call and let her know that everything still looks very stable with her monoclonal protein.  I am not worried about anything with this.[name]

## 2023-09-13 ENCOUNTER — Encounter: Payer: Self-pay | Admitting: Neurology

## 2023-09-13 ENCOUNTER — Ambulatory Visit (INDEPENDENT_AMBULATORY_CARE_PROVIDER_SITE_OTHER): Admitting: Neurology

## 2023-09-13 VITALS — BP 144/86 | HR 65 | Ht 60.0 in | Wt 125.0 lb

## 2023-09-13 DIAGNOSIS — R296 Repeated falls: Secondary | ICD-10-CM

## 2023-09-13 DIAGNOSIS — R269 Unspecified abnormalities of gait and mobility: Secondary | ICD-10-CM | POA: Diagnosis not present

## 2023-09-13 NOTE — Progress Notes (Signed)
 Patient: [name] Date of Birth: 04/14/1934  Reason for Visit: Follow up History from: Patient, daughter  Primary Neurologist: [facility]    HISTORY OF PRESENT ILLNESS: Today 09/13/23 : Patient is seen today upon request from family because of concerns about increasing falls and worsening gait and balance.  Patient was seen 3 weeks ago and MRI scan of the brain was ordered to evaluate for her worsening symptoms.  I personally reviewed the MRI which was done on 09/01/2023 which shows no infarct but remote age bilateral corona radiator lacunar infarcts and moderate age-related changes of small vessel disease and mild degree of generalized cerebral atrophy.  CT cervical spine on 06/12/2023 shows degenerative changes at C5-6 and C6/7 with facet arthropathy but unchanged from prior CT from 01/03/2023.  Patient states most of the falls are related when she is alone and trying to get out of bed and to go to the bathroom especially at night.  She does have a meter and she does quite well if she uses it.  She is still living alone.  Her daughter lives next-door and checks on her frequently.  Patient does needs and help with actives of daily living.  She does have a cleaning lady coming every day.  She remains on aspirin she is tolerating well without bruising or bleeding.  She denies any recurrent stroke or TIA symptoms.  She checks her blood pressures under good control though today is slightly elevated at 144/86.  She remains on Crestor which she is tolerating well without muscle aches and pains.  She has a loop recorder and so far paroxysmal A-fib has not yet been found. Last office visit 08/24/2023  Margie Ege, NP ) switched from Plavix to aspirin in September 2024 due to bleeding/bruising. LR has not shown any abnormality. Continues with falls, can go 6 weeks without falling, then fall few times in a week. Just lose her balance, goes backward and hits her head. Lives alone, daughter lives beside her.  She is not driving much. Chronic back pain, ESI in Jan with Dr. Danielle Dess. Helped her back and balance until she fell and messed it up again. Can have ESI every 3 months. Neuropathy is "bad", her feet and legs  to knees are stinging and burning. Using compound cream. She is worried about her brain, feels like her head doesn't feel like it used to. Feels her memory is not as good since hitting her head, head feels heavy, headaches since she has been falling. A lot of falls in December and January. Has done PT. Wears watch that notifies her family if she falls. In a wheelchair today due to distance but mostly uses [place]. Doesn't have much energy, feels drained in the morning.   08/24/22 SS: Here today for follow-up.  Reviewed Dr. Myna Hidalgo note 06/22/22 still very low IgA kappa MGUS, could be from age-related. Does report sunburn feeling to feet and ache up to her knees, cannot tolerate traditional medications. Balance is unsteady, no recent falls. Her left shoulder has healed. Has a cane. Still living alone. She is driving. No AFIB from loop recorder. Remains on Plavix. Dr. Danielle Dess just gave her ESI lumbar, helped for 3 weeks. Feels doing well at her home, her daughter lives beside her. She has a compounded cream from [facility] she uses.  Update 02/23/22 SS: [name] is here today for follow-up.  Was previously seen by myself and Dr. Anne Hahn, last visit was in May 2022 for history of IgA  MGUS, peripheral neuropathy, gait disorder.  Follows with oncology for IgA kappa MGUS.  Has not been able to tolerate multiple medications for neuropathy.  Has used topical cream with good benefit.  In November 2023 she presented to the ER with left lower extremity weakness, MRI showed multiple small acute infarcts in the right greater than left cerebral hemispheres, also involving right cerebellum concerning for embolic process.  CT angiogram of the head and neck showed beaded appearance of both terminal carotids raising concern for  fibromuscular dysplasia.  No A-fib was seen on cardiac monitor. On Plavix 75 mg daily.   In March 2023 Dr. Danielle Dess performed kyphoplasty for T12 compression fracture with good benefit.  Had a fall 3 months ago fell broke left humerus. Her grandson suddenly passed away over Easter, he was helping to take care of her. She is living alone, she is driving. Since the stroke the left leg is still slightly weak in combination with the neuropathy. Is careful with walking, uses a cane. Home health just finished 1 month ago, she wore a sling. Sees Dr. Myna Hidalgo tomorrow, last saw in June, IgA kappa MGUS is still low level, could be age alone. Not on any medication for neuropathy, her legs burn, the cream doesn't work. Wears support socks.   BP up today here but was 122/72 at gynecologist earlier.   Generally sleeps well, sometimes feet wake her up burning, will use wet cool wash cloth. Ms. Calloway is delightful.   HISTORY  Copied Dr. Pearlean Brownie 08/18/21 HPI: [name] is a 89 year old pleasant Caucasian lady seen today for office consultation visit for stroke.  History is obtained from the patient and review of electronic medical records and opossum reviewed pertinent available imaging films in PACS.  She  has a past medical history of Arthritis, Complication of anesthesia, Coronary artery disease, DI (detrusor instability), Fracture of vertebra, GERD (gastroesophageal reflux disease), History of hiatal hernia, Hypertension, Hypothyroidism, Menopausal symptoms, MGUS (monoclonal gammopathy of unknown significance) (12/27/2017), Osteoporosis, Peripheral neuropathy (12/27/2017), Peripheral neuropathy, Pneumonia, Restless leg syndrome, and Varicose veins.  She presented to [facility] emergency room on 04/19/2022 for evaluation of left lower extremity weakness.  This gradually worsened over the last 2 days.  She was barely able to walk on the day of presentation and unable to lift the leg off the bed when she tried to stand she  fell.  MRI scan of the brain was obtained which showed multiple small acute infarcts in right greater than left cerebral hemispheres and also involving right cerebellum and multiple vascular territories concerning for central embolic process.  CT angiogram of the brain and neck showed no large vessel stenosis or occlusion but there was beaded appearance of both terminal carotids raising concern for fibromuscular dysplasia.  Echocardiogram showed ejection fraction of 60 to 65%.  Left atrial size was not dilated.  LDL cholesterol was 63 mg percent and hemoglobin A1c was 5.3.  She subsequently had a 30-day heart monitor done and no paroxysmal A-fib was found.  Patient was discharged home and underwent home physical and occupational therapy for for 3 months and is able to ambulate well now.  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  She can walk short distances unassisted and uses a cane for outdoors and long distances.  The patient did have a fall in her bathtub and fractured some ribs.  She was seen in December 2022 and found to have significant right-sided hemorrhagic pleural effusion and 1.2 L  but after radiology.  She was also found to have T12 compression fracture and she underwent acrylic balloon kyphoplasty by Dr. Ellery Guthrie on 08/02/2021 for relief of her back pain which has been refractory to medical therapies.  She states her back pain is much better now and she can ambulate quite well.  She has a longstanding history of peripheral neuropathy with hypersensitivity in the stocking distribution and poor balance.  She has seen Dr. Tilda Fogo for the same.  She states her neuropathy symptoms are unchanged.  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  REVIEW OF SYSTEMS: Out of a complete 14 system review of symptoms, the patient complains only of the following symptoms, and all other reviewed systems are negative.  See HPI  ALLERGIES: No Known  Allergies  HOME MEDICATIONS: Outpatient Medications Prior to Visit  Medication Sig Dispense Refill   ALPRAZolam (XANAX) 0.5 MG tablet Take 0.25 mg by mouth 2 (two) times daily as needed (restless legs).     Ascorbic Acid (VITAMIN C PO) Take 1 tablet by mouth daily.     aspirin EC 81 MG tablet Take 1 tablet (81 mg total) by mouth daily. Swallow whole. 30 tablet 12   Calcium Carbonate-Vitamin D (CALCIUM-D PO) Take 1 tablet by mouth daily.     celecoxib (CELEBREX) 200 MG capsule Take 1 tablet by mouth as needed.     dexlansoprazole (DEXILANT) 60 MG capsule Take 60 mg by mouth every morning.     escitalopram (LEXAPRO) 10 MG tablet Take 10 mg by mouth at bedtime.     levothyroxine (SYNTHROID, LEVOTHROID) 25 MCG tablet Take 25 mcg by mouth daily before breakfast.      losartan (COZAAR) 50 MG tablet Take 1 tablet (50 mg total) by mouth daily. (Patient taking differently: Take 50 mg by mouth 2 (two) times daily.) 90 tablet 3   metoprolol succinate (TOPROL-XL) 50 MG 24 hr tablet Take 1.5 tablets (75 mg total) by mouth daily. Take with or immediately following a meal. 135 tablet 3   Multiple Vitamin (MULTIVITAMIN WITH MINERALS) TABS tablet Take 1 tablet by mouth daily.     Probiotic Product (ALIGN) 4 MG CAPS 1 capsule by mouth once a day     pyridoxine (B-6) 500 MG tablet Take 1 tablet (500 mg total) by mouth daily. 30 tablet 5   rosuvastatin (CRESTOR) 20 MG tablet Take 1 tablet (20 mg total) by mouth daily. (Patient taking differently: Take 20 mg by mouth. 05/18/2023 Takes 3 times per week.) 30 tablet 0   Vitamin D, Ergocalciferol, (DRISDOL) 50000 UNITS CAPS Take 50,000 Units by mouth every Friday.     VITAMIN E PO Take 1 capsule by mouth daily.     Wheat Dextrin (BENEFIBER) POWD Take 1 daily dose 500 g 0   zolpidem (AMBIEN CR) 12.5 MG CR tablet Take 12.5 mg by mouth at bedtime.     spironolactone (ALDACTONE) 25 MG tablet Take 0.5 tablets (12.5 mg total) by mouth daily.     No facility-administered  medications prior to visit.    PAST MEDICAL HISTORY: Past Medical History:  Diagnosis Date   Anxiety    Arthritis    Chronic headaches    hx - now just occasional HA per patient   Colon polyps    Complication of anesthesia    Elevated blood pressure after having last Kyphoplasty; pt stated "I was given Morphine and had to stay overnight"   Coronary artery disease  Depression    DI (detrusor instability)    Dilated cardiomyopathy (HCC) 10/04/2022   TTE 10/03/22: EF 42, mild MR, ascending aorta 38 mm   Fracture of vertebra    x 3   GERD (gastroesophageal reflux disease)    Heart failure with mildly reduced ejection fraction (HFmrEF) (HCC) 01/18/2023   - CCTA 10/07/2006: CAC score 1; LAD < 30% soft plaque   - Myoview 03/30/2015: EF 83, no ischemia; low risk  - TTE 04/21/2021: EF 60-65, no RWMA, Gr 1 DD, normal RVSF, trivial MR, trivial AI - TTE 10/03/22: EF 42, global HK, normal RVSF, mild MR, ascending aorta dilation (38 mm), RAP 3    History of blood transfusion 2011   w/ knee replacement surgery   History of hiatal hernia    small - does not cause any problems per patient   HLD (hyperlipidemia)    Hypertension    Hypothyroidism    Menopausal symptoms    MGUS (monoclonal gammopathy of unknown significance) 12/27/2017   IgA   Osteoporosis    Peripheral neuropathy 12/27/2017   Pneumonia    Restless leg syndrome    Stroke (HCC) 03/2021   mini strokes   TMJ click    Varicose veins     PAST SURGICAL HISTORY: Past Surgical History:  Procedure Laterality Date   ABDOMINAL HYSTERECTOMY  1992   TAH,BSO   BLADDER SUSPENSION  2011   CRYOMESH   CARPAL TUNNEL RELEASE Bilateral 1982   CHOLECYSTECTOMY  1992   COLONOSCOPY     EYE SURGERY Bilateral    Cataract removal   IR THORACENTESIS ASP PLEURAL SPACE W/IMG GUIDE  05/28/2021   KYPHOPLASTY     X 3   KYPHOPLASTY N/A 08/02/2021   Procedure: Thoracic Twelve KYPHOPLASTY;  Surgeon: Barnett Abu, MD;  Location: [facility] OR;  Service:  Neurosurgery;  Laterality: N/A;   LUMBAR LAMINECTOMY WITH COFLEX 1 LEVEL N/A 12/29/2015   Procedure: L3-4 Laminectomy with Coflex;  Surgeon: Barnett Abu, MD;  Location: [facility] ORS;  Service: Neurosurgery;  Laterality: N/A;  L3-4 Laminectomy with coflex   OOPHORECTOMY     BSO   REPLACEMENT TOTAL KNEE Bilateral 2008,  2011   RIGHT 2008, LEFT 2011   REVERSE SHOULDER ARTHROPLASTY Right 01/30/2020   Procedure: REVERSE SHOULDER ARTHROPLASTY;  Surgeon: Yolonda Kida, MD;  Location: [facility] ORS;  Service: Orthopedics;  Laterality: Right;  2.5 hrs   SPINAL FUSION  2011   VERTEBRAL SURGERY     T-8, T-10. T-12  DR Ilean Skill    FAMILY HISTORY: Family History  Problem Relation Age of Onset   Heart disease Mother    Heart disease Father    Stroke Father    Hypertension Sister    Diabetes Sister    Ovarian cancer Sister    Diabetes Sister    Rectal cancer Sister        ? Colon or rectal   Hyperlipidemia Sister    Hypertension Sister    Hyperlipidemia Sister    Hypertension Sister    Hypertension Brother    Heart disease Brother    Heart disease Brother    Hypertension Brother    Heart disease Brother    Heart disease Brother    Heart disease Brother    Heart disease Brother    Breast cancer Niece    Heart disease Son    Hyperlipidemia Son    Hyperlipidemia Daughter    Hypertension Daughter     SOCIAL HISTORY: Social History  Socioeconomic History   Marital status: Widowed    Spouse name: Not on file   Number of children: 2   Years of education: 34   Highest education level: Not on file  Occupational History   Occupation: Retired  Tobacco Use   Smoking status: Never   Smokeless tobacco: Never  Vaping Use   Vaping status: Never Used  Substance and Sexual Activity   Alcohol use: No   Drug use: No   Sexual activity: Not Currently    Birth control/protection: Surgical    Comment: Hysterectomy  Other Topics Concern   Not on file  Social History Narrative   Lives   alone   Caffeine use: decaf only   Right handed    Social Drivers of Corporate investment banker Strain: Not on file  Food Insecurity: Not on file  Transportation Needs: Not on file  Physical Activity: Not on file  Stress: Not on file  Social Connections: Unknown (10/09/2021)   Received from [facility]   Social Network    Social Network: Not on file  Intimate Partner Violence: Unknown (08/31/2021)   Received from [facility], [facility]   HITS    Physically Hurt: Not on file    Insult or Talk Down To: Not on file    Threaten Physical Harm: Not on file    Scream or Curse: Not on file   PHYSICAL EXAM  Vitals:   09/13/23 1436  BP: (!) 144/86  Pulse: 65  Weight: 125 lb (56.7 kg)  Height: 5' (1.524 m)   Body mass index is 24.41 kg/m.  Generalized: Pleasant elderly Caucasian lady, seated in wheelchair, not in distress Neurological examination  Mentation: Alert oriented to time, place, history taking. Follows all commands speech and language fluent.  Very pleasant, gives her own history Cranial nerve II-XII: Pupils were equal round reactive to light. Extraocular movements were full, visual field were full on confrontational test. Facial sensation and strength were normal. Head turning and shoulder shrug were normal and symmetric. Motor: The motor testing reveals 5 over 5 strength of all 4 extremities. Good symmetric motor tone is noted throughout.  Sensory: Sensory testing is intact to soft touch on all 4 extremities. No evidence of extinction is noted.  Coordination: Cerebellar testing reveals good finger-nose-finger and heel-to-shin bilaterally.  Gait and station: Needs 1 person assist to get up from the chair.  Drags her right foot slightly, reports due to club foot from polio, she drag it. Gait is unsteady, no [place] to rely on  Skin: Bruising, discoloration to lower legs, thin skin  Reflexes: Deep tendon reflexes are symmetric   DIAGNOSTIC DATA (LABS,  IMAGING, TESTING) - I reviewed patient records, labs, notes, testing and imaging myself where available.  Lab Results  Component Value Date   WBC 6.5 08/31/2023   HGB 14.3 08/31/2023   HCT 42.0 08/31/2023   MCV 95.2 08/31/2023   PLT 285 08/31/2023      Component Value Date/Time   NA 126 (L) 08/31/2023 1332   NA 132 (L) 02/10/2023 1159   K 3.7 08/31/2023 1332   CL 89 (L) 08/31/2023 1332   CO2 28 08/31/2023 1332   GLUCOSE 128 (H) 08/31/2023 1332   BUN 11 08/31/2023 1332   BUN 13 02/10/2023 1159   CREATININE 0.57 08/31/2023 1332   CALCIUM 9.9 08/31/2023 1332   PROT 7.4 08/31/2023 1332   PROT 6.6 11/23/2017 0852   ALBUMIN 4.8 08/31/2023 1332   AST  27 08/31/2023 1332   ALT 25 08/31/2023 1332   ALKPHOS 60 08/31/2023 1332   BILITOT 0.8 08/31/2023 1332   GFRNONAA >60 08/31/2023 1332   GFRAA 55 (L) 01/22/2020 1151   GFRAA >60 12/06/2019 0949   Lab Results  Component Value Date   CHOL 147 10/26/2022   HDL 89 10/26/2022   LDLCALC 44 10/26/2022   TRIG 75 10/26/2022   CHOLHDL 1.7 10/26/2022   Lab Results  Component Value Date   HGBA1C 5.3 04/21/2021   Lab Results  Component Value Date   VITAMINB12 2,378 (H) 08/13/2018   Lab Results  Component Value Date   TSH 1.123 08/31/2023   ASSESSMENT AND PLAN 89 y.o. year old female with history of cryptogenic stroke in November 2022, multiple embolic right brain and cerebellar infarcts.  Loop recorder placed April 2023, no A-fib detected thus far.  Switched from Plavix to aspirin in September 2024 due to falls, bleeding/bruising.  History of chronic IgA mugs peripheral neuropathy, chronic gait disorder, chronic back pain, post kyphoplasty March 2023 with Dr. Ellery Guthrie.  Has had multiple falls in the last 6 months, etiology of fall likely multifactorial due to her prior strokes, degenerative spine disease and peripheral neuropathy.  - I had a long discussion with the patient and her daughter regarding her longstanding gait and balance  difficulties and frequent falls which are likely multifactorial due to combination of previous strokes, age-related changes of small vessel disease, degenerative spine disease and peripheral neuropathy.  We discussed gait and fall precautions and I encouraged her to get up slowly and avoid sudden movements and to use her Soberanis at all times.Continue aspirin 81 mg daily for secondary stroke  prevention and BP  with  goal BP less than 130/90, A1c less than 7.0, LDL less than 70 for secondary stroke prevention.Return for f/u with Jeanmarie Millet, NP in 6 months or call earlier if needed.  Greater than 50% time during this 35-minute visit was spent on counseling and coordination of care about her multifactorial gait and balance difficulties and discussion with patient and daughter and answering questions. Ardella Beaver, MD 09/13/2023, 2:59 PM [facility] [address] [place], [place] [address] [phone]

## 2023-09-13 NOTE — Patient Instructions (Addendum)
 I had a long discussion with the patient and her daughter regarding her longstanding gait and balance difficulties and frequent falls which are likely multifactorial due to combination of previous strokes, age-related changes of small vessel disease, degenerative spine disease and peripheral neuropathy.  We discussed gait and fall precautions and I encouraged her to get up slowly and avoid sudden movements and to use her [place] at all times.Continue aspirin 81 mg daily for secondary stroke  prevention and BP  with  goal BP less than 130/90, A1c less than 7.0, LDL less than 70 for secondary stroke prevention.Return for f/u with Jeanmarie Millet, NP in 6 months or call earlier if needed  Fall Prevention in the Home, Adult Falls can cause injuries and affect people of all ages. There are many simple things that you can do to make your home safe and to help prevent falls. If you need it, ask for help making these changes. What actions can I take to prevent falls? General information Use good lighting in all rooms. Make sure to: Replace any light bulbs that burn out. Turn on lights if it is dark and use night-lights. Keep items that you use often in easy-to-reach places. Lower the shelves around your home if needed. Move furniture so that there are clear paths around it. Do not keep throw rugs or other things on the floor that can make you trip. If any of your floors are uneven, fix them. Add color or contrast paint or tape to clearly mark and help you see: Grab bars or handrails. First and last steps of staircases. Where the edge of each step is. If you use a ladder or stepladder: Make sure that it is fully opened. Do not climb a closed ladder. Make sure the sides of the ladder are locked in place. Have someone hold the ladder while you use it. Know where your pets are as you move through your home. What can I do in the bathroom?     Keep the floor dry. Clean up any water that is on the floor right  away. Remove soap buildup in the bathtub or shower. Buildup makes bathtubs and showers slippery. Use non-skid mats or decals on the floor of the bathtub or shower. Attach bath mats securely with double-sided, non-slip rug tape. If you need to sit down while you are in the shower, use a non-slip stool. Install grab bars by the toilet and in the bathtub and shower. Do not use towel bars as grab bars. What can I do in the bedroom? Make sure that you have a light by your bed that is easy to reach. Do not use any sheets or blankets on your bed that hang to the floor. Have a firm bench or chair with side arms that you can use for support when you get dressed. What can I do in the kitchen? Clean up any spills right away. If you need to reach something above you, use a sturdy step stool that has a grab bar. Keep electrical cables out of the way. Do not use floor polish or wax that makes floors slippery. What can I do with my stairs? Do not leave anything on the stairs. Make sure that you have a light switch at the top and the bottom of the stairs. Have them installed if you do not have them. Make sure that there are handrails on both sides of the stairs. Fix handrails that are broken or loose. Make sure that handrails are as  long as the staircases. Install non-slip stair treads on all stairs in your home if they do not have carpet. Avoid having throw rugs at the top or bottom of stairs, or secure the rugs with carpet tape to prevent them from moving. Choose a carpet design that does not hide the edge of steps on the stairs. Make sure that carpet is firmly attached to the stairs. Fix any carpet that is loose or worn. What can I do on the outside of my home? Use bright outdoor lighting. Repair the edges of walkways and driveways and fix any cracks. Clear paths of anything that can make you trip, such as tools or rocks. Add color or contrast paint or tape to clearly mark and help you see high doorway  thresholds. Trim any bushes or trees on the main path into your home. Check that handrails are securely fastened and in good repair. Both sides of all steps should have handrails. Install guardrails along the edges of any raised decks or porches. Have leaves, snow, and ice cleared regularly. Use sand, salt, or ice melt on walkways during winter months if you live where there is ice and snow. In the garage, clean up any spills right away, including grease or oil spills. What other actions can I take? Review your medicines with your health care provider. Some medicines can make you confused or feel dizzy. This can increase your chance of falling. Wear closed-toe shoes that fit well and support your feet. Wear shoes that have rubber soles and low heels. Use a cane, Mario, scooter, or crutches that help you move around if needed. Talk with your provider about other ways that you can decrease your risk of falls. This may include seeing a physical therapist to learn to do exercises to improve movement and strength. Where to find more information Centers for Disease Control and Prevention, STE[employer]: TonerPromos.no [employer] on Aging: BaseRingTones.pl National Institute on Aging: BaseRingTones.pl Contact a health care provider if: You are afraid of falling at home. You feel weak, drowsy, or dizzy at home. You fall at home. Get help right away if you: Lose consciousness or have trouble moving after a fall. Have a fall that causes a head injury. These symptoms may be an emergency. Get help right away. Call 911. Do not wait to see if the symptoms will go away. Do not drive yourself to the hospital. This information is not intended to replace advice given to you by your health care provider. Make sure you discuss any questions you have with your health care provider. Document Revised: 01/17/2022 Document Reviewed: 01/17/2022 Elsevier Patient Education  2024 [employer].

## 2023-09-18 ENCOUNTER — Ambulatory Visit (INDEPENDENT_AMBULATORY_CARE_PROVIDER_SITE_OTHER): Payer: Medicare Other

## 2023-09-18 DIAGNOSIS — I639 Cerebral infarction, unspecified: Secondary | ICD-10-CM | POA: Diagnosis not present

## 2023-09-18 LAB — CUP PACEART REMOTE DEVICE CHECK
Date Time Interrogation Session: 20250420231245
Implantable Pulse Generator Implant Date: 20230407

## 2023-10-01 DIAGNOSIS — S81801A Unspecified open wound, right lower leg, initial encounter: Secondary | ICD-10-CM | POA: Diagnosis not present

## 2023-10-04 ENCOUNTER — Telehealth: Payer: Self-pay

## 2023-10-04 DIAGNOSIS — Z79899 Other long term (current) drug therapy: Secondary | ICD-10-CM

## 2023-10-04 DIAGNOSIS — I10 Essential (primary) hypertension: Secondary | ICD-10-CM

## 2023-10-04 MED ORDER — LOSARTAN POTASSIUM 100 MG PO TABS
100.0000 mg | ORAL_TABLET | Freq: Every day | ORAL | 1 refills | Status: DC
Start: 2023-10-04 — End: 2024-03-02

## 2023-10-04 NOTE — Telephone Encounter (Signed)
 [facility] is requesting a refill on medication Losartan  50 mg tablet, pt taking 2 tablets daily. I called the pt and pt stated that her blood pressure was high at her oncologist appt the oncologist wanted her to start taking her losartan  50 mg tablets twice daily. This is not how medication is listed on pt's medication list. Would Dr. Lorie Rook like to change the directions on how pt is supposed to take medication? Pt would like a call back concerning this matter. Please address

## 2023-10-04 NOTE — Telephone Encounter (Signed)
 LVMTCB to get recent BP readings from patient. At Oncology office visit on 08/31/2023 BP was 189/96. At Neurology office visit on 09/13/23, BP was 144/86.

## 2023-10-04 NOTE — Addendum Note (Signed)
 Addended by: Edra Govern D on: 10/04/2023 04:46 PM   Modules accepted: Orders

## 2023-10-04 NOTE — Progress Notes (Signed)
 [facility] Summary Report / Loop Recorder

## 2023-10-04 NOTE — Telephone Encounter (Signed)
 Called and spoke to pt regarding the change in Losartan  RX. She has been taking 100 mg (50 mg BID) for about two weeks. Dr. Arthuro Billow advice (100 mg at once at bedtime and have BMP drawn) given to pt who verbalized understanding. She states her BP on 10/03/23 at dentist's office was 110/78, and this was the best it has been in a while. She is also concerned about having labs drawn, as it is very difficult for her to get out of the house, due to recent falls and mobility issues. She will try to have someone take her to a [facility] this week to have BMP drawn. She will call us  back if she feels that her BP's are too low (pt mentioned this at beginning of phone call, stating "I'm taking so many BP meds that I think my BP is on the low side). She has our phone number. No other concerns for now.

## 2023-10-19 ENCOUNTER — Ambulatory Visit: Payer: Self-pay | Admitting: Cardiology

## 2023-10-19 ENCOUNTER — Ambulatory Visit (INDEPENDENT_AMBULATORY_CARE_PROVIDER_SITE_OTHER)

## 2023-10-19 DIAGNOSIS — I639 Cerebral infarction, unspecified: Secondary | ICD-10-CM

## 2023-10-19 LAB — CUP PACEART REMOTE DEVICE CHECK
Date Time Interrogation Session: 20250521231258
Implantable Pulse Generator Implant Date: 20230407

## 2023-10-20 ENCOUNTER — Inpatient Hospital Stay (HOSPITAL_BASED_OUTPATIENT_CLINIC_OR_DEPARTMENT_OTHER): Admitting: Hematology & Oncology

## 2023-10-20 ENCOUNTER — Encounter: Payer: Self-pay | Admitting: Hematology & Oncology

## 2023-10-20 ENCOUNTER — Inpatient Hospital Stay: Attending: Hematology & Oncology

## 2023-10-20 VITALS — BP 153/83 | HR 76 | Temp 97.8°F | Resp 20 | Ht 60.0 in | Wt 122.0 lb

## 2023-10-20 DIAGNOSIS — I10 Essential (primary) hypertension: Secondary | ICD-10-CM | POA: Diagnosis not present

## 2023-10-20 DIAGNOSIS — Z7982 Long term (current) use of aspirin: Secondary | ICD-10-CM | POA: Diagnosis not present

## 2023-10-20 DIAGNOSIS — R410 Disorientation, unspecified: Secondary | ICD-10-CM | POA: Diagnosis not present

## 2023-10-20 DIAGNOSIS — R519 Headache, unspecified: Secondary | ICD-10-CM | POA: Diagnosis not present

## 2023-10-20 DIAGNOSIS — Z79899 Other long term (current) drug therapy: Secondary | ICD-10-CM | POA: Diagnosis not present

## 2023-10-20 DIAGNOSIS — R748 Abnormal levels of other serum enzymes: Secondary | ICD-10-CM | POA: Diagnosis not present

## 2023-10-20 DIAGNOSIS — D472 Monoclonal gammopathy: Secondary | ICD-10-CM

## 2023-10-20 DIAGNOSIS — M791 Myalgia, unspecified site: Secondary | ICD-10-CM | POA: Diagnosis not present

## 2023-10-20 LAB — CMP (CANCER CENTER ONLY)
ALT: 22 U/L (ref 0–44)
AST: 22 U/L (ref 15–41)
Albumin: 5.1 g/dL — ABNORMAL HIGH (ref 3.5–5.0)
Alkaline Phosphatase: 71 U/L (ref 38–126)
Anion gap: 8 (ref 5–15)
BUN: 18 mg/dL (ref 8–23)
CO2: 29 mmol/L (ref 22–32)
Calcium: 10.6 mg/dL — ABNORMAL HIGH (ref 8.9–10.3)
Chloride: 93 mmol/L — ABNORMAL LOW (ref 98–111)
Creatinine: 0.71 mg/dL (ref 0.44–1.00)
GFR, Estimated: >60 mL/min (ref >60)
Glucose, Bld: 98 mg/dL (ref 70–99)
Potassium: 3.8 mmol/L (ref 3.5–5.1)
Sodium: 130 mmol/L — ABNORMAL LOW (ref 135–145)
Total Bilirubin: 0.6 mg/dL (ref 0.0–1.2)
Total Protein: 7.5 g/dL (ref 6.5–8.1)

## 2023-10-20 LAB — CBC WITH DIFFERENTIAL (CANCER CENTER ONLY)
Abs Immature Granulocytes: 0.01 10*3/uL (ref 0.00–0.07)
Basophils Absolute: 0 10*3/uL (ref 0.0–0.1)
Basophils Relative: 0 %
Eosinophils Absolute: 0 10*3/uL (ref 0.0–0.5)
Eosinophils Relative: 0 %
HCT: 41.6 % (ref 36.0–46.0)
Hemoglobin: 14 g/dL (ref 12.0–15.0)
Immature Granulocytes: 0 %
Lymphocytes Relative: 11 %
Lymphs Abs: 1 10*3/uL (ref 0.7–4.0)
MCH: 32.4 pg (ref 26.0–34.0)
MCHC: 33.7 g/dL (ref 30.0–36.0)
MCV: 96.3 fL (ref 80.0–100.0)
Monocytes Absolute: 0.9 10*3/uL (ref 0.1–1.0)
Monocytes Relative: 10 %
Neutro Abs: 6.6 10*3/uL (ref 1.7–7.7)
Neutrophils Relative %: 79 %
Platelet Count: 323 10*3/uL (ref 150–400)
RBC: 4.32 MIL/uL (ref 3.87–5.11)
RDW: 12.4 % (ref 11.5–15.5)
WBC Count: 8.5 10*3/uL (ref 4.0–10.5)
nRBC: 0 % (ref 0.0–0.2)

## 2023-10-20 LAB — LACTATE DEHYDROGENASE: LDH: 162 U/L (ref 98–192)

## 2023-10-20 LAB — VITAMIN B12: Vitamin B-12: 2475 pg/mL — ABNORMAL HIGH (ref 180–914)

## 2023-10-20 LAB — FERRITIN: Ferritin: 78 ng/mL (ref 11–307)

## 2023-10-20 LAB — TSH: TSH: 1.46 u[IU]/mL (ref 0.350–4.500)

## 2023-10-20 NOTE — Progress Notes (Signed)
 Hematology and Oncology Follow Up Visit  [name] [id number] 1933-10-19 89 y.o. 10/20/2023   Principle Diagnosis:  IgA Kappa MGUS  Current Therapy:   Observation     Interim History:  [name] is back for follow-up.  She is still having some problems with her blood pressure.  Her blood pressure is still on the high side.  I know that she is on losartan , metoprolol  XL, Aldactone .  I told her to increase the losartan  to 100 mg p.o. daily.  I told her to increase the Aldactone  up to 50 mg p.o. daily.  Hopefully, you will see her family doctor in a couple weeks to try to help with the blood pressure.  I know that Dr. Delorise Few will do a great job in getting the blood pressure under better control.  She has seen neurology.  She had a MRI of the brain on 09/01/2023.  This showed remote bilateral corona radiata lacunar infarcts and some age-related chronic small vessel disease and generalized cerebral atrophy.  She had an epidural injection I think yesterday.  As far as her MGUS is concerned, this really is not a problem.  When I saw her, her monoclonal spike was 0.2 g/dL.  Her IgA level was 362 mg/dL.  The Kappa light chain was 1.9 mg/dL.  I know that she has symptoms of falling.  Thankfully, she has not broken anything.  Overall, I would say that her performance status is probably ECOG 2.     Medications:  Current Outpatient Medications:    ALPRAZolam  (XANAX ) 0.5 MG tablet, Take 0.25 mg by mouth 2 (two) times daily as needed (restless legs)., Disp: , Rfl:    Ascorbic Acid (VITAMIN C PO), Take 1 tablet by mouth daily., Disp: , Rfl:    aspirin  EC 81 MG tablet, Take 1 tablet (81 mg total) by mouth daily. Swallow whole., Disp: 30 tablet, Rfl: 12   Calcium  Carbonate-Vitamin D  (CALCIUM -D PO), Take 1 tablet by mouth daily., Disp: , Rfl:    dexlansoprazole  (DEXILANT ) 60 MG capsule, Take 60 mg by mouth every morning., Disp: , Rfl:    escitalopram  (LEXAPRO ) 10 MG tablet, Take 10 mg by mouth  at bedtime., Disp: , Rfl:    levothyroxine  (SYNTHROID , LEVOTHROID) 25 MCG tablet, Take 25 mcg by mouth daily before breakfast. , Disp: , Rfl:    losartan  (COZAAR ) 100 MG tablet, Take 1 tablet (100 mg total) by mouth daily. Take once daily at bedtime (Patient taking differently: Take 100 mg by mouth 2 (two) times daily.), Disp: 90 tablet, Rfl: 1   metoprolol  succinate (TOPROL -XL) 50 MG 24 hr tablet, Take 1.5 tablets (75 mg total) by mouth daily. Take with or immediately following a meal., Disp: 135 tablet, Rfl: 3   Multiple Vitamin (MULTIVITAMIN WITH MINERALS) TABS tablet, Take 1 tablet by mouth daily., Disp: , Rfl:    Probiotic Product (ALIGN) 4 MG CAPS, 1 capsule by mouth once a day, Disp: , Rfl:    pyridoxine  (B-6) 500 MG tablet, Take 1 tablet (500 mg total) by mouth daily., Disp: 30 tablet, Rfl: 5   rosuvastatin  (CRESTOR ) 20 MG tablet, Take 1 tablet (20 mg total) by mouth daily. (Patient taking differently: Take 20 mg by mouth. 05/18/2023 Takes 3 times per week.), Disp: 30 tablet, Rfl: 0   Vitamin D , Ergocalciferol , (DRISDOL ) 50000 UNITS CAPS, Take 50,000 Units by mouth every Friday., Disp: , Rfl:    VITAMIN E PO, Take 1 capsule by mouth daily., Disp: , Rfl:  Wheat Dextrin (BENEFIBER) POWD, Take 1 daily dose, Disp: 500 g, Rfl: 0   zolpidem  (AMBIEN  CR) 12.5 MG CR tablet, Take 12.5 mg by mouth at bedtime., Disp: , Rfl:    celecoxib  (CELEBREX ) 200 MG capsule, Take 1 tablet by mouth as needed. (Patient not taking: Reported on 10/20/2023), Disp: , Rfl:    spironolactone  (ALDACTONE ) 25 MG tablet, Take 0.5 tablets (12.5 mg total) by mouth daily., Disp: , Rfl:   Allergies: No Known Allergies  Past Medical History, Surgical history, Social history, and Family History were reviewed and updated.  Review of Systems: Review of Systems  Constitutional: Negative.  Negative for appetite change, fatigue, fever and unexpected weight change.  HENT:  Negative.  Negative for lump/mass, mouth sores, sore throat  and trouble swallowing.   Eyes: Negative.   Respiratory: Negative.  Negative for cough, hemoptysis and shortness of breath.   Cardiovascular: Negative.  Negative for leg swelling and palpitations.  Gastrointestinal: Negative.  Negative for abdominal distention, abdominal pain, blood in stool, constipation, diarrhea, nausea and vomiting.  Endocrine: Negative.   Genitourinary: Negative.  Negative for bladder incontinence, dysuria, frequency and hematuria.   Musculoskeletal:  Positive for myalgias. Negative for arthralgias, back pain and gait problem.  Skin: Negative.  Negative for itching and rash.  Neurological:  Positive for headaches. Negative for dizziness, extremity weakness, gait problem, numbness, seizures and speech difficulty.  Hematological: Negative.  Does not bruise/bleed easily.  Psychiatric/Behavioral: Negative.  Negative for depression and sleep disturbance. The patient is not nervous/anxious.     Physical Exam: Temperature is 97.8.  Pulse 76.  Blood pressure 153/83.  Weight is 122 pounds.    Wt Readings from Last 3 Encounters:  10/20/23 122 lb (55.3 kg)  09/13/23 125 lb (56.7 kg)  08/31/23 125 lb (56.7 kg)    Physical Exam Vitals reviewed.  HENT:     Head: Normocephalic and atraumatic.  Eyes:     Pupils: Pupils are equal, round, and reactive to light.  Cardiovascular:     Rate and Rhythm: Normal rate and regular rhythm.     Heart sounds: Normal heart sounds.  Pulmonary:     Effort: Pulmonary effort is normal.     Breath sounds: Normal breath sounds.  Abdominal:     General: Bowel sounds are normal.     Palpations: Abdomen is soft.  Musculoskeletal:        General: No tenderness or deformity. Normal range of motion.     Cervical back: Normal range of motion.  Lymphadenopathy:     Cervical: No cervical adenopathy.  Skin:    General: Skin is warm and dry.     Findings: No erythema or rash.  Neurological:     Mental Status: She is alert and oriented to person,  place, and time.  Psychiatric:        Behavior: Behavior normal.        Thought Content: Thought content normal.        Judgment: Judgment normal.     Lab Results  Component Value Date   WBC 8.5 10/20/2023   HGB 14.0 10/20/2023   HCT 41.6 10/20/2023   MCV 96.3 10/20/2023   PLT 323 10/20/2023     Chemistry      Component Value Date/Time   NA 126 (L) 08/31/2023 1332   NA 132 (L) 02/10/2023 1159   K 3.7 08/31/2023 1332   CL 89 (L) 08/31/2023 1332   CO2 28 08/31/2023 1332   BUN 11 08/31/2023  1332   BUN 13 02/10/2023 1159   CREATININE 0.57 08/31/2023 1332      Component Value Date/Time   CALCIUM  9.9 08/31/2023 1332   ALKPHOS 60 08/31/2023 1332   AST 27 08/31/2023 1332   ALT 25 08/31/2023 1332   BILITOT 0.8 08/31/2023 1332      Impression and Plan: Ms. Sterbenz is a 89 year old white female.  She has an IgA kappa MGUS.  This is still at a very low level.  This certainly could be from her age alone.  I know that she is working hard to track the blood pressure under better control.  I think this will help her in the long run with respect to overall quality of life.  Maybe, by adjusting her blood pressure medications up a little bit, she can get the blood pressure down.  Hopefully, we can move her appointment out a little bit longer now.  We will try to get her back to see us  in another couple months now.    Ivor Mars, MD 5/23/20251:57 PM

## 2023-10-21 LAB — IGG, IGA, IGM
IgA: 350 mg/dL (ref 64–422)
IgG (Immunoglobin G), Serum: 738 mg/dL (ref 586–1602)
IgM (Immunoglobulin M), Srm: 93 mg/dL (ref 26–217)

## 2023-10-24 LAB — KAPPA/LAMBDA LIGHT CHAINS
Kappa free light chain: 15.4 mg/L (ref 3.3–19.4)
Kappa, lambda light chain ratio: 1.29 (ref 0.26–1.65)
Lambda free light chains: 11.9 mg/L (ref 5.7–26.3)

## 2023-10-24 LAB — IRON AND IRON BINDING CAPACITY (CC-WL,HP ONLY)
Iron: 110 ug/dL (ref 28–170)
Saturation Ratios: 23 % (ref 10.4–31.8)
TIBC: 484 ug/dL — ABNORMAL HIGH (ref 250–450)
UIBC: 374 ug/dL (ref 148–442)

## 2023-11-08 NOTE — Progress Notes (Signed)
 [facility] Summary Report / Loop Recorder

## 2023-11-19 ENCOUNTER — Ambulatory Visit (INDEPENDENT_AMBULATORY_CARE_PROVIDER_SITE_OTHER)

## 2023-11-19 DIAGNOSIS — I639 Cerebral infarction, unspecified: Secondary | ICD-10-CM | POA: Diagnosis not present

## 2023-11-20 ENCOUNTER — Ambulatory Visit: Payer: Self-pay | Admitting: Cardiology

## 2023-11-20 LAB — CUP PACEART REMOTE DEVICE CHECK
Date Time Interrogation Session: 20250621232856
Implantable Pulse Generator Implant Date: 20230407

## 2023-11-21 ENCOUNTER — Ambulatory Visit

## 2023-11-27 ENCOUNTER — Ambulatory Visit: Payer: Medicare Other

## 2023-12-04 ENCOUNTER — Other Ambulatory Visit: Payer: Self-pay | Admitting: Internal Medicine

## 2023-12-06 NOTE — Progress Notes (Signed)
 [facility] Summary Report / Loop Recorder

## 2023-12-10 ENCOUNTER — Other Ambulatory Visit: Payer: Self-pay | Admitting: Physician Assistant

## 2023-12-20 ENCOUNTER — Ambulatory Visit

## 2023-12-20 ENCOUNTER — Inpatient Hospital Stay

## 2023-12-20 ENCOUNTER — Ambulatory Visit: Admitting: Hematology & Oncology

## 2023-12-20 DIAGNOSIS — I639 Cerebral infarction, unspecified: Secondary | ICD-10-CM

## 2023-12-20 LAB — CUP PACEART REMOTE DEVICE CHECK
Date Time Interrogation Session: 20250722232511
Implantable Pulse Generator Implant Date: 20230407

## 2023-12-21 ENCOUNTER — Inpatient Hospital Stay (HOSPITAL_BASED_OUTPATIENT_CLINIC_OR_DEPARTMENT_OTHER): Admitting: Hematology & Oncology

## 2023-12-21 ENCOUNTER — Ambulatory Visit: Payer: Self-pay | Admitting: Cardiology

## 2023-12-21 ENCOUNTER — Inpatient Hospital Stay: Attending: Hematology & Oncology

## 2023-12-21 VITALS — BP 155/65 | HR 81 | Temp 97.6°F | Resp 18 | Ht 60.0 in | Wt 125.0 lb

## 2023-12-21 DIAGNOSIS — R296 Repeated falls: Secondary | ICD-10-CM | POA: Diagnosis not present

## 2023-12-21 DIAGNOSIS — D472 Monoclonal gammopathy: Secondary | ICD-10-CM | POA: Diagnosis not present

## 2023-12-21 DIAGNOSIS — Z79899 Other long term (current) drug therapy: Secondary | ICD-10-CM | POA: Insufficient documentation

## 2023-12-21 DIAGNOSIS — I420 Dilated cardiomyopathy: Secondary | ICD-10-CM | POA: Diagnosis not present

## 2023-12-21 DIAGNOSIS — I10 Essential (primary) hypertension: Secondary | ICD-10-CM

## 2023-12-21 LAB — CMP (CANCER CENTER ONLY)
ALT: 39 U/L (ref 0–44)
AST: 24 U/L (ref 15–41)
Albumin: 4.6 g/dL (ref 3.5–5.0)
Alkaline Phosphatase: 67 U/L (ref 38–126)
Anion gap: 12 (ref 5–15)
BUN: 15 mg/dL (ref 8–23)
CO2: 26 mmol/L (ref 22–32)
Calcium: 10.6 mg/dL — ABNORMAL HIGH (ref 8.9–10.3)
Chloride: 98 mmol/L (ref 98–111)
Creatinine: 0.73 mg/dL (ref 0.44–1.00)
GFR, Estimated: >60 mL/min (ref >60)
Glucose, Bld: 98 mg/dL (ref 70–99)
Potassium: 3.9 mmol/L (ref 3.5–5.1)
Sodium: 135 mmol/L (ref 135–145)
Total Bilirubin: 0.6 mg/dL (ref 0.0–1.2)
Total Protein: 7.3 g/dL (ref 6.5–8.1)

## 2023-12-21 LAB — CBC WITH DIFFERENTIAL (CANCER CENTER ONLY)
Abs Immature Granulocytes: 0.03 K/uL (ref 0.00–0.07)
Basophils Absolute: 0 K/uL (ref 0.0–0.1)
Basophils Relative: 0 %
Eosinophils Absolute: 0.1 K/uL (ref 0.0–0.5)
Eosinophils Relative: 1 %
HCT: 40.7 % (ref 36.0–46.0)
Hemoglobin: 13.7 g/dL (ref 12.0–15.0)
Immature Granulocytes: 1 %
Lymphocytes Relative: 18 %
Lymphs Abs: 1.1 K/uL (ref 0.7–4.0)
MCH: 32.9 pg (ref 26.0–34.0)
MCHC: 33.7 g/dL (ref 30.0–36.0)
MCV: 97.6 fL (ref 80.0–100.0)
Monocytes Absolute: 0.7 K/uL (ref 0.1–1.0)
Monocytes Relative: 12 %
Neutro Abs: 3.9 K/uL (ref 1.7–7.7)
Neutrophils Relative %: 68 %
Platelet Count: 332 K/uL (ref 150–400)
RBC: 4.17 MIL/uL (ref 3.87–5.11)
RDW: 13.1 % (ref 11.5–15.5)
WBC Count: 5.8 K/uL (ref 4.0–10.5)
nRBC: 0 % (ref 0.0–0.2)

## 2023-12-21 LAB — LACTATE DEHYDROGENASE: LDH: 229 U/L — ABNORMAL HIGH (ref 98–192)

## 2023-12-21 NOTE — Progress Notes (Signed)
 Hematology and Oncology Follow Up Visit  [name] [id number] 1933/12/08 89 y.o. 12/21/2023   Principle Diagnosis:  IgA Kappa MGUS  Current Therapy:   Observation     Interim History:  [name] is back for follow-up.  Her main problem has been falling.  She has fallen many times.  Thankfully, she has not broken her hip.  She has hurt her right elbow..  She recently fell and hit the back of her head.  This happened I think a day or so ago.  She did not go to the ER.  Thankfully, at least, her blood pressure is doing better.  I am happy about that.  She seems to have problems with falling mostly at nighttime.  Her daughter comes with her.  Her daughter is going to get her some type of device to try to help with her legs and try to help strengthen her legs.  None of this is related to her having the MGUS.  She has not seen her family doctor about all of this.  She I think is seeing Neurology but they really have not been able to do anything.  When we last saw her, the monoclonal spike was 0.2 g/dL.  The IgA level was 350 mg/dL.  The Kappa light chain was 1.5 mg/dL.  She gets Meals on Wheels now.  Hopefully this will help with her nutrition.  Hopefully this may help with her strength.  Thankfully, she has not yet broken a hip.  Overall, I would say her performance status and prior ECOG 3.    Medications:  Current Outpatient Medications:    ALPRAZolam  (XANAX ) 0.5 MG tablet, Take 0.25 mg by mouth 2 (two) times daily as needed (restless legs)., Disp: , Rfl:    Ascorbic Acid (VITAMIN C PO), Take 1 tablet by mouth daily., Disp: , Rfl:    aspirin  EC 81 MG tablet, Take 1 tablet (81 mg total) by mouth daily. Swallow whole., Disp: 30 tablet, Rfl: 12   Calcium  Carbonate-Vitamin D  (CALCIUM -D PO), Take 1 tablet by mouth daily., Disp: , Rfl:    celecoxib  (CELEBREX ) 200 MG capsule, Take 1 tablet by mouth as needed., Disp: , Rfl:    dexlansoprazole  (DEXILANT ) 60 MG capsule, Take 60 mg by  mouth every morning., Disp: , Rfl:    escitalopram  (LEXAPRO ) 10 MG tablet, Take 10 mg by mouth at bedtime., Disp: , Rfl:    levothyroxine  (SYNTHROID , LEVOTHROID) 25 MCG tablet, Take 25 mcg by mouth daily before breakfast. , Disp: , Rfl:    losartan  (COZAAR ) 100 MG tablet, Take 1 tablet (100 mg total) by mouth daily. Take once daily at bedtime (Patient taking differently: Take 100 mg by mouth 2 (two) times daily.), Disp: 90 tablet, Rfl: 1   metoprolol  succinate (TOPROL -XL) 50 MG 24 hr tablet, Take 1.5 tablets (75 mg total) by mouth daily. Take with or immediately following a meal., Disp: 135 tablet, Rfl: 3   Multiple Vitamin (MULTIVITAMIN WITH MINERALS) TABS tablet, Take 1 tablet by mouth daily., Disp: , Rfl:    Probiotic Product (ALIGN) 4 MG CAPS, 1 capsule by mouth once a day, Disp: , Rfl:    pyridoxine  (B-6) 500 MG tablet, Take 1 tablet (500 mg total) by mouth daily., Disp: 30 tablet, Rfl: 5   rosuvastatin  (CRESTOR ) 20 MG tablet, Take 1 tablet (20 mg total) by mouth daily. (Patient taking differently: Take 20 mg by mouth. 05/18/2023 Takes 3 times per week.), Disp: 30 tablet, Rfl: 0  spironolactone  (ALDACTONE ) 25 MG tablet, TAKE 1 TABLET (25 MG TOTAL) BY MOUTH DAILY., Disp: 90 tablet, Rfl: 0   Vitamin D , Ergocalciferol , (DRISDOL ) 50000 UNITS CAPS, Take 50,000 Units by mouth every Friday., Disp: , Rfl:    VITAMIN E PO, Take 1 capsule by mouth daily., Disp: , Rfl:    Wheat Dextrin (BENEFIBER) POWD, Take 1 daily dose, Disp: 500 g, Rfl: 0   zolpidem  (AMBIEN  CR) 12.5 MG CR tablet, Take 12.5 mg by mouth at bedtime., Disp: , Rfl:   Allergies: No Known Allergies  Past Medical History, Surgical history, Social history, and Family History were reviewed and updated.  Review of Systems: Review of Systems  Constitutional: Negative.  Negative for appetite change, fatigue, fever and unexpected weight change.  HENT:  Negative.  Negative for lump/mass, mouth sores, sore throat and trouble swallowing.   Eyes:  Negative.   Respiratory: Negative.  Negative for cough, hemoptysis and shortness of breath.   Cardiovascular: Negative.  Negative for leg swelling and palpitations.  Gastrointestinal: Negative.  Negative for abdominal distention, abdominal pain, blood in stool, constipation, diarrhea, nausea and vomiting.  Endocrine: Negative.   Genitourinary: Negative.  Negative for bladder incontinence, dysuria, frequency and hematuria.   Musculoskeletal:  Positive for myalgias. Negative for arthralgias, back pain and gait problem.  Skin: Negative.  Negative for itching and rash.  Neurological:  Positive for headaches. Negative for dizziness, extremity weakness, gait problem, numbness, seizures and speech difficulty.  Hematological: Negative.  Does not bruise/bleed easily.  Psychiatric/Behavioral: Negative.  Negative for depression and sleep disturbance. The patient is not nervous/anxious.     Physical Exam: Temperature is 97.6.  Pulse 81.  Blood pressure 155/65.  Weight is 125 pounds.    Wt Readings from Last 3 Encounters:  12/21/23 125 lb (56.7 kg)  10/20/23 122 lb (55.3 kg)  09/13/23 125 lb (56.7 kg)    Physical Exam Vitals reviewed.  HENT:     Head: Normocephalic and atraumatic.  Eyes:     Pupils: Pupils are equal, round, and reactive to light.  Cardiovascular:     Rate and Rhythm: Normal rate and regular rhythm.     Heart sounds: Normal heart sounds.  Pulmonary:     Effort: Pulmonary effort is normal.     Breath sounds: Normal breath sounds.  Abdominal:     General: Bowel sounds are normal.     Palpations: Abdomen is soft.  Musculoskeletal:        General: No tenderness or deformity. Normal range of motion.     Cervical back: Normal range of motion.  Lymphadenopathy:     Cervical: No cervical adenopathy.  Skin:    General: Skin is warm and dry.     Findings: No erythema or rash.  Neurological:     Mental Status: She is alert and oriented to person, place, and time.  Psychiatric:         Behavior: Behavior normal.        Thought Content: Thought content normal.        Judgment: Judgment normal.      Lab Results  Component Value Date   WBC 5.8 12/21/2023   HGB 13.7 12/21/2023   HCT 40.7 12/21/2023   MCV 97.6 12/21/2023   PLT 332 12/21/2023     Chemistry      Component Value Date/Time   NA 135 12/21/2023 1136   NA 132 (L) 02/10/2023 1159   K 3.9 12/21/2023 1136   CL 98 12/21/2023 1136  CO2 26 12/21/2023 1136   BUN 15 12/21/2023 1136   BUN 13 02/10/2023 1159   CREATININE 0.73 12/21/2023 1136      Component Value Date/Time   CALCIUM  10.6 (H) 12/21/2023 1136   ALKPHOS 67 12/21/2023 1136   AST 24 12/21/2023 1136   ALT 39 12/21/2023 1136   BILITOT 0.6 12/21/2023 1136      Impression and Plan: Ms. Feld is a 89 year old white female.  She has an IgA kappa MGUS.  This is still at a very low level.  This certainly could be from her age alone.  Again, having this following episodes are clearly into be her problem.  I just worried that 1 day she is going to fall and break her hip.  Maybe, strength in her legs will help.  Again, her family doctor needs to know about this.  Hopefully, she will let him know.  I will plan to see her back in another 2 months or so.  Hopefully, at some point, we will be able to move her appointments out a little bit longer.    Maude JONELLE Crease, MD 7/24/202512:40 PM

## 2023-12-22 LAB — IGG, IGA, IGM
IgA: 328 mg/dL (ref 64–422)
IgG (Immunoglobin G), Serum: 662 mg/dL (ref 586–1602)
IgM (Immunoglobulin M), Srm: 70 mg/dL (ref 26–217)

## 2023-12-22 LAB — KAPPA/LAMBDA LIGHT CHAINS
Kappa free light chain: 26.5 mg/L — ABNORMAL HIGH (ref 3.3–19.4)
Kappa, lambda light chain ratio: 1.39 (ref 0.26–1.65)
Lambda free light chains: 19 mg/L (ref 5.7–26.3)

## 2023-12-25 ENCOUNTER — Ambulatory Visit

## 2023-12-27 ENCOUNTER — Ambulatory Visit: Payer: Self-pay | Admitting: Hematology & Oncology

## 2023-12-27 LAB — PROTEIN ELECTROPHORESIS, SERUM, WITH REFLEX
A/G Ratio: 1.2 (ref 0.7–1.7)
Albumin ELP: 3.8 g/dL (ref 2.9–4.4)
Alpha-1-Globulin: 0.3 g/dL (ref 0.0–0.4)
Alpha-2-Globulin: 1 g/dL (ref 0.4–1.0)
Beta Globulin: 1.1 g/dL (ref 0.7–1.3)
Gamma Globulin: 0.8 g/dL (ref 0.4–1.8)
Globulin, Total: 3.3 g/dL (ref 2.2–3.9)
M-Spike, %: 0.2 g/dL — ABNORMAL HIGH
SPEP Interpretation: 0
Total Protein ELP: 7.1 g/dL (ref 6.0–8.5)

## 2023-12-27 LAB — IMMUNOFIXATION REFLEX, SERUM
IgA: 328 mg/dL (ref 64–422)
IgG (Immunoglobin G), Serum: 669 mg/dL (ref 586–1602)
IgM (Immunoglobulin M), Srm: 79 mg/dL (ref 26–217)

## 2023-12-27 NOTE — Addendum Note (Signed)
 Addended by: TAWNI DRILLING D on: 12/27/2023 03:19 PM   Modules accepted: Orders

## 2023-12-27 NOTE — Progress Notes (Signed)
 [facility] Summary Report / Loop Recorder

## 2023-12-28 NOTE — Telephone Encounter (Signed)
-----   Message from Maude JONELLE Crease sent at 12/27/2023  3:04 PM EDT ----- Please call her -- you MUST actually talk to her about the results ---  the abnormal protein is still very low at 0.2 g/dL  there is nothing to worry about with your blood!!   Jeralyn ----- Message ----- From: Rebecka, [facility] [place] Sent: 12/21/2023  12:04 PM EDT To: Maude JONELLE Crease, MD

## 2023-12-28 NOTE — Telephone Encounter (Signed)
 RN attempted x 2 to reach patient at 2 listed numbers but reached non-identifying VM.

## 2024-01-01 ENCOUNTER — Ambulatory Visit: Payer: Medicare Other

## 2024-01-19 ENCOUNTER — Emergency Department (HOSPITAL_COMMUNITY)

## 2024-01-19 ENCOUNTER — Other Ambulatory Visit: Payer: Self-pay

## 2024-01-19 ENCOUNTER — Encounter (HOSPITAL_COMMUNITY): Payer: Self-pay

## 2024-01-19 ENCOUNTER — Emergency Department (HOSPITAL_COMMUNITY)
Admission: EM | Admit: 2024-01-19 | Discharge: 2024-01-19 | Disposition: A | Discharge status: Home / Self Care | Attending: Emergency Medicine | Admitting: Emergency Medicine

## 2024-01-19 DIAGNOSIS — I2510 Atherosclerotic heart disease of native coronary artery without angina pectoris: Secondary | ICD-10-CM | POA: Diagnosis not present

## 2024-01-19 DIAGNOSIS — Z7982 Long term (current) use of aspirin: Secondary | ICD-10-CM | POA: Insufficient documentation

## 2024-01-19 DIAGNOSIS — R0602 Shortness of breath: Secondary | ICD-10-CM | POA: Insufficient documentation

## 2024-01-19 DIAGNOSIS — I509 Heart failure, unspecified: Secondary | ICD-10-CM | POA: Insufficient documentation

## 2024-01-19 DIAGNOSIS — Z8673 Personal history of transient ischemic attack (TIA), and cerebral infarction without residual deficits: Secondary | ICD-10-CM | POA: Insufficient documentation

## 2024-01-19 DIAGNOSIS — N3001 Acute cystitis with hematuria: Secondary | ICD-10-CM

## 2024-01-19 DIAGNOSIS — Z79899 Other long term (current) drug therapy: Secondary | ICD-10-CM | POA: Insufficient documentation

## 2024-01-19 DIAGNOSIS — R531 Weakness: Secondary | ICD-10-CM | POA: Diagnosis present

## 2024-01-19 DIAGNOSIS — R519 Headache, unspecified: Secondary | ICD-10-CM | POA: Insufficient documentation

## 2024-01-19 DIAGNOSIS — I110 Hypertensive heart disease with heart failure: Secondary | ICD-10-CM | POA: Diagnosis not present

## 2024-01-19 LAB — BASIC METABOLIC PANEL WITH GFR
Anion gap: 12 (ref 5–15)
BUN: 6 mg/dL — ABNORMAL LOW (ref 8–23)
CO2: 25 mmol/L (ref 22–32)
Calcium: 8.8 mg/dL — ABNORMAL LOW (ref 8.9–10.3)
Chloride: 91 mmol/L — ABNORMAL LOW (ref 98–111)
Creatinine, Ser: 0.44 mg/dL (ref 0.44–1.00)
GFR, Estimated: >60 mL/min (ref >60)
Glucose, Bld: 104 mg/dL — ABNORMAL HIGH (ref 70–99)
Potassium: 3.3 mmol/L — ABNORMAL LOW (ref 3.5–5.1)
Sodium: 128 mmol/L — ABNORMAL LOW (ref 135–145)

## 2024-01-19 LAB — CBC WITH DIFFERENTIAL/PLATELET
Abs Immature Granulocytes: 0.02 K/uL (ref 0.00–0.07)
Basophils Absolute: 0 K/uL (ref 0.0–0.1)
Basophils Relative: 0 %
Eosinophils Absolute: 0 K/uL (ref 0.0–0.5)
Eosinophils Relative: 0 %
HCT: 37.9 % (ref 36.0–46.0)
Hemoglobin: 12.8 g/dL (ref 12.0–15.0)
Immature Granulocytes: 0 %
Lymphocytes Relative: 19 %
Lymphs Abs: 1.1 K/uL (ref 0.7–4.0)
MCH: 32.7 pg (ref 26.0–34.0)
MCHC: 33.8 g/dL (ref 30.0–36.0)
MCV: 96.7 fL (ref 80.0–100.0)
Monocytes Absolute: 0.7 K/uL (ref 0.1–1.0)
Monocytes Relative: 11 %
Neutro Abs: 4.3 K/uL (ref 1.7–7.7)
Neutrophils Relative %: 70 %
Platelets: 310 K/uL (ref 150–400)
RBC: 3.92 MIL/uL (ref 3.87–5.11)
RDW: 12.9 % (ref 11.5–15.5)
WBC: 6.1 K/uL (ref 4.0–10.5)
nRBC: 0 % (ref 0.0–0.2)

## 2024-01-19 LAB — URINALYSIS, ROUTINE W REFLEX MICROSCOPIC
Bilirubin Urine: NEGATIVE
Glucose, UA: NEGATIVE mg/dL
Hgb urine dipstick: NEGATIVE
Ketones, ur: 5 mg/dL — AB
Nitrite: NEGATIVE
Protein, ur: 30 mg/dL — AB
Specific Gravity, Urine: 1.013 (ref 1.005–1.030)
pH: 7 (ref 5.0–8.0)

## 2024-01-19 LAB — RESP PANEL BY RT-PCR (RSV, FLU A&B, COVID)  RVPGX2
Influenza A by PCR: NEGATIVE
Influenza B by PCR: NEGATIVE
Resp Syncytial Virus by PCR: NEGATIVE
SARS Coronavirus 2 by RT PCR: NEGATIVE

## 2024-01-19 LAB — TROPONIN I (HIGH SENSITIVITY)
Troponin I (High Sensitivity): 9 ng/L (ref <18)
Troponin I (High Sensitivity): 9 ng/L (ref <18)

## 2024-01-19 MED ORDER — KETOROLAC TROMETHAMINE 15 MG/ML IJ SOLN
15.0000 mg | Freq: Once | INTRAMUSCULAR | Status: AC
  Administered 2024-01-19: 15 mg via INTRAVENOUS
  Filled 2024-01-19: qty 1

## 2024-01-19 MED ORDER — LOSARTAN POTASSIUM 50 MG PO TABS
100.0000 mg | ORAL_TABLET | Freq: Once | ORAL | Status: AC
Start: 2024-01-19 — End: 2024-01-19
  Administered 2024-01-19: 50 mg via ORAL
  Filled 2024-01-19: qty 2

## 2024-01-19 MED ORDER — SODIUM CHLORIDE 0.9 % IV BOLUS
1000.0000 mL | Freq: Once | INTRAVENOUS | Status: AC
  Administered 2024-01-19: 1000 mL via INTRAVENOUS

## 2024-01-19 MED ORDER — PROCHLORPERAZINE EDISYLATE 10 MG/2ML IJ SOLN
10.0000 mg | Freq: Once | INTRAMUSCULAR | Status: AC
  Administered 2024-01-19: 10 mg via INTRAVENOUS
  Filled 2024-01-19: qty 2

## 2024-01-19 MED ORDER — CEFADROXIL 500 MG PO CAPS: 500.0000 mg | ORAL_CAPSULE | Freq: Two times a day (BID) | ORAL | 0 refills | Status: DC

## 2024-01-19 MED ORDER — SPIRONOLACTONE 12.5 MG HALF TABLET: 25.0000 mg | ORAL_TABLET | Freq: Every day | ORAL | Status: DC

## 2024-01-19 MED ORDER — CEFADROXIL 500 MG PO CAPS
500.0000 mg | ORAL_CAPSULE | Freq: Once | ORAL | Status: AC
  Administered 2024-01-19: 500 mg via ORAL
  Filled 2024-01-19: qty 1

## 2024-01-19 NOTE — Discharge Instructions (Addendum)
 As we discussed, looks like there might be developing UTI.  We will treat this with antibiotics.  Please take as prescribed.  I would eat a couple bananas over the weekend.  Please drink plenty of fluids and get plenty of rest.  All your imaging looks great.  Rest of your lab work looked awesome.  I would like for you to follow-up with your primary care doctor next week as we have good close follow-up.  Please return for any worsening symptoms like we discussed.

## 2024-01-19 NOTE — ED Provider Notes (Signed)
 Physical Exam  BP (!) 188/100   Pulse 80   Temp 99.1 F (37.3 C) (Oral)   Resp (!) 21   Ht 5' (1.524 m)   Wt 54 kg   SpO2 99%   BMI 23.24 kg/m   Physical Exam Vitals and nursing note reviewed.  Constitutional:      General: She is not in acute distress.    Appearance: Normal appearance.  HENT:     Head: Normocephalic and atraumatic.  Eyes:     General:        Right eye: No discharge.        Left eye: No discharge.  Cardiovascular:     Comments: Regular rate and rhythm.  S1/S2 are distinct without any evidence of murmur, rubs, or gallops.  Radial pulses are 2+ bilaterally.  Dorsalis pedis pulses are 2+ bilaterally.  No evidence of pedal edema. Pulmonary:     Comments: Clear to auscultation bilaterally.  Normal effort.  No respiratory distress.  No evidence of wheezes, rales, or rhonchi heard throughout. Abdominal:     General: Abdomen is flat. Bowel sounds are normal. There is no distension.     Tenderness: There is no abdominal tenderness. There is no guarding or rebound.  Musculoskeletal:        General: Normal range of motion.     Cervical back: Neck supple.  Skin:    General: Skin is warm and dry.     Findings: No rash.  Neurological:     General: No focal deficit present.     Mental Status: She is alert.     Comments: Cranial nerves II through XII are intact.  5/5 strength to the upper and lower extremities.  Normal sensation to the upper and lower extremities.  No obvious nystagmus.  Psychiatric:        Mood and Affect: Mood normal.        Behavior: Behavior normal.     Procedures  Procedures  ED Course / MDM   Clinical Course as of 01/19/24 2231  Fri Jan 19, 2024  2223 CBC with Differential Negative.  [CF]  2223 Troponin I (High Sensitivity) Initial and delta troponin are negative.  [CF]  2223 Basic metabolic panel(!) Mild hyponatremia, hypochloremia, and hypokalemia.  Patient did receive some fluids here. [CF]  2223 Resp panel by RT-PCR (RSV, Flu  A&B, Covid) Anterior Nasal Swab Negative.  [CF]  2224 DG Chest Portable 1 View Negative.  I do agree with the radiologist interpretation. [CF]  2224 CT head and cervical spine were all normal.  I do agree with the radiologist interpretation. [CF]  2224 MR BRAIN WO CONTRAST No signs of stroke.  I do agree with the radiologist interpretation. [CF]    Clinical Course User Index [CF] Yvonne Cameron HERO, PA-C   Medical Decision Making Amount and/or Complexity of Data Reviewed Labs: ordered. Decision-making details documented in ED Course. Radiology: ordered. Decision-making details documented in ED Course.  Risk Prescription drug management.   Accepted handoff at shift change from [place] PA-C. Please see prior provider note for more detail.   Briefly: Patient is 89 y.o. female patient who presents to the emerged from today for further evaluation of headache and dizziness.  She also reports some generalized weakness.  Patient has been having some frequent falls for some time but none in the last week.  She reports that she had general malaise.  Denies chest pain or abdominal pain.  DDX: concern for altered mental status, stroke,  UTI, headache.  Plan: Headache is improving after migraine cocktail, fluids.  Patient's MRI was negative for stroke.  Rest of her lab work is otherwise reassuring apart from her urinalysis which does appear to show signs of urinary tract infection.  Given that the patient is exhibiting some generalized weakness and headache I will treat her for UTI.  Also gave her some fluids which should help her electrolyte abnormalities.  Will have her take potassium.  I will have them follow-up with her PCP early next week so we have good close follow-up.  Strict return precautions were discussed.  Patient safe discharge.              Yvonne Peers [place], PA-C 01/19/24 2233    Tegeler, Lonni PARAS, MD 01/19/24 [phone]

## 2024-01-19 NOTE — ED Notes (Signed)
 RN noticed facial droop and slurred speech. EDP notified and at bedside. Symptoms started gradually over the past week and family stated they noticed worsening over the past 2 days.

## 2024-01-19 NOTE — ED Triage Notes (Signed)
 Patient bib EMS from home with complaints of a worsening headache and hypertension since she fell a week ago. EMS reports she is also reporting weakness all over. Patient denies hitting her head and does not take blood thinners.

## 2024-01-19 NOTE — ED Provider Notes (Signed)
 [place] EMERGENCY DEPARTMENT AT [facility] Provider Note   CSN: [id number] Arrival date & time: 01/19/24  1405     Patient presents with: Fall, Weakness, and Gait Problem (/)   [name] is a 89 y.o. female with a past medical history significant for dilated cardiomyopathy, CHF, CAD, hyperlipidemia, hypertension, MGUS, history of CVA who presents to the ED due to headache, neck pain, and dizziness.  Patient also admits to generalized weakness.  Patient has frequent falls.  Family at bedside notes her last fall was roughly 1 week ago.  Did not hit her head.  Not on any blood thinners.  Patient admits to generalized weakness.  Family at bedside notes her speech has been slightly different over the past few days.  Patient denies chest pain however, admits to some shortness of breath.  No abdominal pain.  History obtained from patient and past medical records. No interpreter used during encounter.       Prior to Admission medications   Medication Sig Start Date End Date Taking? Authorizing Provider  ALPRAZolam  (XANAX ) 0.5 MG tablet Take 0.25 mg by mouth 2 (two) times daily as needed (restless legs).    [provider]  Ascorbic Acid (VITAMIN C PO) Take 1 tablet by mouth daily.    [provider]  aspirin  EC 81 MG tablet Take 1 tablet (81 mg total) by mouth daily. Swallow whole. 02/20/23   Lelon Hamilton T, PA-C  Calcium  Carbonate-Vitamin D  (CALCIUM -D PO) Take 1 tablet by mouth daily.    [provider]  celecoxib  (CELEBREX ) 200 MG capsule Take 1 tablet by mouth as needed. 09/28/20   [provider]  dexlansoprazole  (DEXILANT ) 60 MG capsule Take 60 mg by mouth every morning.    [provider]  escitalopram  (LEXAPRO ) 10 MG tablet Take 10 mg by mouth at bedtime.    [provider]  levothyroxine  (SYNTHROID , LEVOTHROID) 25 MCG tablet Take 25 mcg by mouth daily before breakfast.     [provider]  losartan  (COZAAR )  100 MG tablet Take 1 tablet (100 mg total) by mouth daily. Take once daily at bedtime Patient taking differently: Take 100 mg by mouth 2 (two) times daily. 10/04/23   Thukkani, Arun K, MD  metoprolol  succinate (TOPROL -XL) 50 MG 24 hr tablet Take 1.5 tablets (75 mg total) by mouth daily. Take with or immediately following a meal. 12/28/22   Cindie Ole DASEN, MD  Multiple Vitamin (MULTIVITAMIN WITH MINERALS) TABS tablet Take 1 tablet by mouth daily.    [provider]  Probiotic Product (ALIGN) 4 MG CAPS 1 capsule by mouth once a day    [provider]  pyridoxine  (B-6) 500 MG tablet Take 1 tablet (500 mg total) by mouth daily. 02/24/22   Timmy Maude SAUNDERS, MD  rosuvastatin  (CRESTOR ) 20 MG tablet Take 1 tablet (20 mg total) by mouth daily. Patient taking differently: Take 20 mg by mouth. 05/18/2023 Takes 3 times per week. 04/22/21   Jerri Keys, MD  spironolactone  (ALDACTONE ) 25 MG tablet TAKE 1 TABLET (25 MG TOTAL) BY MOUTH DAILY. 12/11/23   Thukkani, Arun K, MD  Vitamin D , Ergocalciferol , (DRISDOL ) 50000 UNITS CAPS Take 50,000 Units by mouth every Friday.    [provider]  VITAMIN E PO Take 1 capsule by mouth daily.    [provider]  Wheat Dextrin (BENEFIBER) POWD Take 1 daily dose 02/09/21   Eda Iha, MD  zolpidem  (AMBIEN  CR) 12.5 MG CR tablet Take 12.5  mg by mouth at bedtime.    [provider]    Allergies: Patient has no known allergies.    Review of Systems  Constitutional:  Negative for fever.  Eyes:  Negative for visual disturbance.  Respiratory:  Positive for shortness of breath.   Cardiovascular:  Negative for chest pain.  Gastrointestinal:  Negative for abdominal pain.  Neurological:  Positive for dizziness, speech difficulty and headaches.    Updated Vital Signs BP (!) 182/94   Pulse 83   Temp 99.1 F (37.3 C) (Oral)   Resp 11   Ht 5' (1.524 m)   Wt 54 kg   SpO2 98%   BMI 23.24 kg/m   Physical Exam Vitals and nursing  note reviewed.  Constitutional:      General: She is not in acute distress.    Appearance: She is not ill-appearing.  HENT:     Head: Normocephalic.  Eyes:     Pupils: Pupils are equal, round, and reactive to light.  Neck:     Comments: No cervical midline tenderness Cardiovascular:     Rate and Rhythm: Normal rate and regular rhythm.     Pulses: Normal pulses.     Heart sounds: Normal heart sounds. No murmur heard.    No friction rub. No gallop.  Pulmonary:     Effort: Pulmonary effort is normal.     Breath sounds: Normal breath sounds.  Abdominal:     General: Abdomen is flat. There is no distension.     Palpations: Abdomen is soft.     Tenderness: There is no abdominal tenderness. There is no guarding or rebound.  Musculoskeletal:        General: Normal range of motion.     Cervical back: Neck supple.  Skin:    General: Skin is warm and dry.  Neurological:     General: No focal deficit present.     Mental Status: She is alert.     Comments: Questionable left sided facial droop Patient has difficulties answer questions (unsure if this is patient's baseline) Equal grip strength Patient has difficulties with EOMs No pronator drift   Psychiatric:        Mood and Affect: Mood normal.        Behavior: Behavior normal.     (all labs ordered are listed, but only abnormal results are displayed) Labs Reviewed  CBC WITH DIFFERENTIAL/PLATELET  BASIC METABOLIC PANEL WITH GFR  URINALYSIS, ROUTINE W REFLEX MICROSCOPIC  TROPONIN I (HIGH SENSITIVITY)    EKG: None  Radiology: No results found.   Procedures   Medications Ordered in the ED - No data to display                                  Medical Decision Making Amount and/or Complexity of Data Reviewed Independent Historian: caregiver External Data Reviewed: notes. Labs: ordered. Decision-making details documented in ED Course. Radiology: ordered and independent interpretation performed. Decision-making  details documented in ED Course. ECG/medicine tests: ordered and independent interpretation performed. Decision-making details documented in ED Course.  This patient presents to the ED for concern of dizziness/weakness, this involves an extensive number of treatment options, and is a complaint that carries with it a high risk of complications and morbidity.  The differential diagnosis includes CVA, electrolyte abnormalities, infection, dehydration, etc  89 year old female presents to the ED due to dizziness and generalized weakness.  Family at bedside notes  that she has frequent falls.  Last fall 1 week ago.  Family also admits to changes in speech over the past few days.  Previous CVA.  Upon arrival stable vitals.  BP 182/94.  Patient in no acute distress.  Questionable left-sided facial droop at rest however, not when smiling.  Equal grip strength.  No pronator drift.  Routine labs ordered to rule out electrolyte abnormalities and infection.  CT head and cervical spine ordered.  Chest x-ray given some shortness of breath.  If CT head is unremarkable patient may need MRI brain to rule out CVA. Patient out of TNK window.   Patient handed off to [employer], PA-C at shift changing pending work-up.     Final diagnoses:  Generalized weakness  Acute nonintractable headache, unspecified headache type    ED Discharge Orders     None          Lorelle Aleck BROCKS, PA-C 01/19/24 1505    Emil Share, DO 01/19/24 1517

## 2024-01-20 ENCOUNTER — Ambulatory Visit

## 2024-01-20 DIAGNOSIS — I639 Cerebral infarction, unspecified: Secondary | ICD-10-CM

## 2024-01-23 LAB — CUP PACEART REMOTE DEVICE CHECK
Date Time Interrogation Session: 20250822231318
Implantable Pulse Generator Implant Date: 20230407

## 2024-01-24 ENCOUNTER — Ambulatory Visit: Payer: Self-pay | Admitting: Cardiology

## 2024-01-29 ENCOUNTER — Ambulatory Visit

## 2024-02-05 ENCOUNTER — Ambulatory Visit: Payer: Medicare Other

## 2024-02-06 ENCOUNTER — Inpatient Hospital Stay (HOSPITAL_COMMUNITY)

## 2024-02-06 ENCOUNTER — Emergency Department (HOSPITAL_COMMUNITY)

## 2024-02-06 ENCOUNTER — Other Ambulatory Visit: Payer: Self-pay

## 2024-02-06 ENCOUNTER — Encounter (HOSPITAL_COMMUNITY): Payer: Self-pay | Admitting: Internal Medicine

## 2024-02-06 ENCOUNTER — Inpatient Hospital Stay (HOSPITAL_COMMUNITY): Admitting: Anesthesiology

## 2024-02-06 ENCOUNTER — Encounter (HOSPITAL_COMMUNITY)
Admission: EM | Disposition: A | Discharge status: Skilled Nursing Facility | Payer: Self-pay | Source: Home / Self Care | Attending: Family Medicine

## 2024-02-06 ENCOUNTER — Inpatient Hospital Stay (HOSPITAL_COMMUNITY)
Admission: EM | Admit: 2024-02-06 | Discharge: 2024-02-13 | DRG: 481 | Disposition: A | Discharge status: Skilled Nursing Facility | Attending: Emergency Medicine | Admitting: Emergency Medicine

## 2024-02-06 DIAGNOSIS — D472 Monoclonal gammopathy: Secondary | ICD-10-CM | POA: Diagnosis present

## 2024-02-06 DIAGNOSIS — Z96652 Presence of left artificial knee joint: Secondary | ICD-10-CM | POA: Diagnosis present

## 2024-02-06 DIAGNOSIS — Z90710 Acquired absence of both cervix and uterus: Secondary | ICD-10-CM

## 2024-02-06 DIAGNOSIS — Z9049 Acquired absence of other specified parts of digestive tract: Secondary | ICD-10-CM

## 2024-02-06 DIAGNOSIS — Y92091 Bathroom in other non-institutional residence as the place of occurrence of the external cause: Secondary | ICD-10-CM | POA: Diagnosis not present

## 2024-02-06 DIAGNOSIS — Z8041 Family history of malignant neoplasm of ovary: Secondary | ICD-10-CM

## 2024-02-06 DIAGNOSIS — G2581 Restless legs syndrome: Secondary | ICD-10-CM | POA: Diagnosis present

## 2024-02-06 DIAGNOSIS — D539 Nutritional anemia, unspecified: Secondary | ICD-10-CM | POA: Diagnosis present

## 2024-02-06 DIAGNOSIS — E673 Hypervitaminosis D: Secondary | ICD-10-CM | POA: Diagnosis present

## 2024-02-06 DIAGNOSIS — Z9842 Cataract extraction status, left eye: Secondary | ICD-10-CM

## 2024-02-06 DIAGNOSIS — Z8249 Family history of ischemic heart disease and other diseases of the circulatory system: Secondary | ICD-10-CM

## 2024-02-06 DIAGNOSIS — Z8673 Personal history of transient ischemic attack (TIA), and cerebral infarction without residual deficits: Secondary | ICD-10-CM

## 2024-02-06 DIAGNOSIS — I5022 Chronic systolic (congestive) heart failure: Secondary | ICD-10-CM | POA: Diagnosis present

## 2024-02-06 DIAGNOSIS — Z79890 Hormone replacement therapy (postmenopausal): Secondary | ICD-10-CM

## 2024-02-06 DIAGNOSIS — Z96649 Presence of unspecified artificial hip joint: Secondary | ICD-10-CM | POA: Diagnosis present

## 2024-02-06 DIAGNOSIS — Z7901 Long term (current) use of anticoagulants: Secondary | ICD-10-CM | POA: Diagnosis not present

## 2024-02-06 DIAGNOSIS — M898X9 Other specified disorders of bone, unspecified site: Secondary | ICD-10-CM | POA: Diagnosis present

## 2024-02-06 DIAGNOSIS — S72451A Displaced supracondylar fracture without intracondylar extension of lower end of right femur, initial encounter for closed fracture: Secondary | ICD-10-CM

## 2024-02-06 DIAGNOSIS — E871 Hypo-osmolality and hyponatremia: Secondary | ICD-10-CM | POA: Diagnosis present

## 2024-02-06 DIAGNOSIS — I2510 Atherosclerotic heart disease of native coronary artery without angina pectoris: Secondary | ICD-10-CM

## 2024-02-06 DIAGNOSIS — G629 Polyneuropathy, unspecified: Secondary | ICD-10-CM | POA: Diagnosis present

## 2024-02-06 DIAGNOSIS — M80051A Age-related osteoporosis with current pathological fracture, right femur, initial encounter for fracture: Secondary | ICD-10-CM | POA: Diagnosis present

## 2024-02-06 DIAGNOSIS — K219 Gastro-esophageal reflux disease without esophagitis: Secondary | ICD-10-CM | POA: Diagnosis present

## 2024-02-06 DIAGNOSIS — F32A Depression, unspecified: Secondary | ICD-10-CM | POA: Diagnosis present

## 2024-02-06 DIAGNOSIS — M978XXA Periprosthetic fracture around other internal prosthetic joint, initial encounter: Secondary | ICD-10-CM | POA: Diagnosis present

## 2024-02-06 DIAGNOSIS — I110 Hypertensive heart disease with heart failure: Secondary | ICD-10-CM | POA: Diagnosis present

## 2024-02-06 DIAGNOSIS — Z8701 Personal history of pneumonia (recurrent): Secondary | ICD-10-CM

## 2024-02-06 DIAGNOSIS — F419 Anxiety disorder, unspecified: Secondary | ICD-10-CM

## 2024-02-06 DIAGNOSIS — M810 Age-related osteoporosis without current pathological fracture: Secondary | ICD-10-CM | POA: Diagnosis present

## 2024-02-06 DIAGNOSIS — M79604 Pain in right leg: Secondary | ICD-10-CM | POA: Diagnosis not present

## 2024-02-06 DIAGNOSIS — E039 Hypothyroidism, unspecified: Secondary | ICD-10-CM | POA: Diagnosis present

## 2024-02-06 DIAGNOSIS — S72401A Unspecified fracture of lower end of right femur, initial encounter for closed fracture: Secondary | ICD-10-CM | POA: Diagnosis present

## 2024-02-06 DIAGNOSIS — Z9841 Cataract extraction status, right eye: Secondary | ICD-10-CM

## 2024-02-06 DIAGNOSIS — I420 Dilated cardiomyopathy: Secondary | ICD-10-CM | POA: Diagnosis present

## 2024-02-06 DIAGNOSIS — G4700 Insomnia, unspecified: Secondary | ICD-10-CM | POA: Diagnosis present

## 2024-02-06 DIAGNOSIS — M1990 Unspecified osteoarthritis, unspecified site: Secondary | ICD-10-CM | POA: Diagnosis present

## 2024-02-06 DIAGNOSIS — I10 Essential (primary) hypertension: Secondary | ICD-10-CM

## 2024-02-06 DIAGNOSIS — Z9889 Other specified postprocedural states: Secondary | ICD-10-CM

## 2024-02-06 DIAGNOSIS — M9711XA Periprosthetic fracture around internal prosthetic right knee joint, initial encounter: Secondary | ICD-10-CM | POA: Diagnosis present

## 2024-02-06 DIAGNOSIS — Z90722 Acquired absence of ovaries, bilateral: Secondary | ICD-10-CM

## 2024-02-06 DIAGNOSIS — Z860100 Personal history of colon polyps, unspecified: Secondary | ICD-10-CM

## 2024-02-06 DIAGNOSIS — Z87440 Personal history of urinary (tract) infections: Secondary | ICD-10-CM

## 2024-02-06 DIAGNOSIS — Z7982 Long term (current) use of aspirin: Secondary | ICD-10-CM

## 2024-02-06 DIAGNOSIS — Z96611 Presence of right artificial shoulder joint: Secondary | ICD-10-CM | POA: Diagnosis present

## 2024-02-06 DIAGNOSIS — I8390 Asymptomatic varicose veins of unspecified lower extremity: Secondary | ICD-10-CM | POA: Diagnosis present

## 2024-02-06 DIAGNOSIS — D62 Acute posthemorrhagic anemia: Secondary | ICD-10-CM | POA: Diagnosis not present

## 2024-02-06 DIAGNOSIS — Z823 Family history of stroke: Secondary | ICD-10-CM

## 2024-02-06 DIAGNOSIS — Z743 Need for continuous supervision: Secondary | ICD-10-CM | POA: Diagnosis not present

## 2024-02-06 DIAGNOSIS — Z800 Family history of malignant neoplasm of digestive organs: Secondary | ICD-10-CM

## 2024-02-06 DIAGNOSIS — I739 Peripheral vascular disease, unspecified: Secondary | ICD-10-CM | POA: Diagnosis present

## 2024-02-06 DIAGNOSIS — W1830XA Fall on same level, unspecified, initial encounter: Secondary | ICD-10-CM | POA: Diagnosis present

## 2024-02-06 DIAGNOSIS — I959 Hypotension, unspecified: Secondary | ICD-10-CM | POA: Diagnosis not present

## 2024-02-06 DIAGNOSIS — Z981 Arthrodesis status: Secondary | ICD-10-CM

## 2024-02-06 DIAGNOSIS — Z803 Family history of malignant neoplasm of breast: Secondary | ICD-10-CM

## 2024-02-06 DIAGNOSIS — Z9181 History of falling: Secondary | ICD-10-CM

## 2024-02-06 DIAGNOSIS — K449 Diaphragmatic hernia without obstruction or gangrene: Secondary | ICD-10-CM | POA: Diagnosis present

## 2024-02-06 DIAGNOSIS — Z79899 Other long term (current) drug therapy: Secondary | ICD-10-CM

## 2024-02-06 DIAGNOSIS — Z833 Family history of diabetes mellitus: Secondary | ICD-10-CM

## 2024-02-06 DIAGNOSIS — Z83438 Family history of other disorder of lipoprotein metabolism and other lipidemia: Secondary | ICD-10-CM

## 2024-02-06 DIAGNOSIS — E785 Hyperlipidemia, unspecified: Secondary | ICD-10-CM | POA: Diagnosis present

## 2024-02-06 HISTORY — PX: ORIF FEMUR FRACTURE: SHX2119

## 2024-02-06 LAB — PHOSPHORUS: Phosphorus: 3.3 mg/dL (ref 2.5–4.6)

## 2024-02-06 LAB — CBC WITH DIFFERENTIAL/PLATELET
Abs Immature Granulocytes: 0.09 K/uL — ABNORMAL HIGH (ref 0.00–0.07)
Basophils Absolute: 0 K/uL (ref 0.0–0.1)
Basophils Relative: 0 %
Eosinophils Absolute: 0 K/uL (ref 0.0–0.5)
Eosinophils Relative: 0 %
HCT: 32.7 % — ABNORMAL LOW (ref 36.0–46.0)
Hemoglobin: 10.1 g/dL — ABNORMAL LOW (ref 12.0–15.0)
Immature Granulocytes: 1 %
Lymphocytes Relative: 9 %
Lymphs Abs: 1.3 K/uL (ref 0.7–4.0)
MCH: 32.1 pg (ref 26.0–34.0)
MCHC: 30.9 g/dL (ref 30.0–36.0)
MCV: 103.8 fL — ABNORMAL HIGH (ref 80.0–100.0)
Monocytes Absolute: 1.2 K/uL — ABNORMAL HIGH (ref 0.1–1.0)
Monocytes Relative: 9 %
Neutro Abs: 11.2 K/uL — ABNORMAL HIGH (ref 1.7–7.7)
Neutrophils Relative %: 81 %
Platelets: 331 K/uL (ref 150–400)
RBC: 3.15 MIL/uL — ABNORMAL LOW (ref 3.87–5.11)
RDW: 13.5 % (ref 11.5–15.5)
WBC: 13.8 K/uL — ABNORMAL HIGH (ref 4.0–10.5)
nRBC: 0 % (ref 0.0–0.2)

## 2024-02-06 LAB — TYPE AND SCREEN
ABO/RH(D): O NEG
Antibody Screen: NEGATIVE

## 2024-02-06 LAB — BASIC METABOLIC PANEL WITH GFR
Anion gap: 11 (ref 5–15)
BUN: 28 mg/dL — ABNORMAL HIGH (ref 8–23)
CO2: 23 mmol/L (ref 22–32)
Calcium: 9.8 mg/dL (ref 8.9–10.3)
Chloride: 99 mmol/L (ref 98–111)
Creatinine, Ser: 0.78 mg/dL (ref 0.44–1.00)
GFR, Estimated: >60 mL/min (ref >60)
Glucose, Bld: 131 mg/dL — ABNORMAL HIGH (ref 70–99)
Potassium: 4.1 mmol/L (ref 3.5–5.1)
Sodium: 133 mmol/L — ABNORMAL LOW (ref 135–145)

## 2024-02-06 LAB — CREATININE, SERUM
Creatinine, Ser: 0.76 mg/dL (ref 0.44–1.00)
GFR, Estimated: >60 mL/min (ref >60)

## 2024-02-06 LAB — CBC
HCT: 26.8 % — ABNORMAL LOW (ref 36.0–46.0)
Hemoglobin: 8.7 g/dL — ABNORMAL LOW (ref 12.0–15.0)
MCH: 33.1 pg (ref 26.0–34.0)
MCHC: 32.5 g/dL (ref 30.0–36.0)
MCV: 101.9 fL — ABNORMAL HIGH (ref 80.0–100.0)
Platelets: 308 K/uL (ref 150–400)
RBC: 2.63 MIL/uL — ABNORMAL LOW (ref 3.87–5.11)
RDW: 13.7 % (ref 11.5–15.5)
WBC: 10.6 K/uL — ABNORMAL HIGH (ref 4.0–10.5)
nRBC: 0 % (ref 0.0–0.2)

## 2024-02-06 LAB — MAGNESIUM: Magnesium: 2.1 mg/dL (ref 1.7–2.4)

## 2024-02-06 SURGERY — OPEN REDUCTION INTERNAL FIXATION (ORIF) DISTAL FEMUR FRACTURE
Anesthesia: General | Laterality: Right

## 2024-02-06 MED ORDER — FENTANYL CITRATE PF 50 MCG/ML IJ SOSY
12.5000 ug | PREFILLED_SYRINGE | INTRAMUSCULAR | Status: DC | PRN
  Administered 2024-02-06: 50 ug via INTRAVENOUS
  Filled 2024-02-06: qty 1

## 2024-02-06 MED ORDER — ONDANSETRON HCL 4 MG/2ML IJ SOLN
INTRAMUSCULAR | Status: DC | PRN
  Administered 2024-02-06: 4 mg via INTRAVENOUS

## 2024-02-06 MED ORDER — SUGAMMADEX SODIUM 200 MG/2ML IV SOLN
INTRAVENOUS | Status: DC | PRN
  Administered 2024-02-06: 200 mg via INTRAVENOUS

## 2024-02-06 MED ORDER — PROPOFOL 10 MG/ML IV BOLUS
INTRAVENOUS | Status: DC | PRN
  Administered 2024-02-06: 50 mg via INTRAVENOUS

## 2024-02-06 MED ORDER — ONDANSETRON HCL 4 MG/2ML IJ SOLN
4.0000 mg | Freq: Once | INTRAMUSCULAR | Status: AC
  Administered 2024-02-06: 4 mg via INTRAVENOUS
  Filled 2024-02-06: qty 2

## 2024-02-06 MED ORDER — SPIRONOLACTONE 25 MG PO TABS
25.0000 mg | ORAL_TABLET | Freq: Every day | ORAL | Status: DC
  Administered 2024-02-07 – 2024-02-13 (×7): 25 mg via ORAL
  Filled 2024-02-06 (×7): qty 1

## 2024-02-06 MED ORDER — DOCUSATE SODIUM 100 MG PO CAPS
100.0000 mg | ORAL_CAPSULE | Freq: Two times a day (BID) | ORAL | Status: DC
  Administered 2024-02-07 – 2024-02-13 (×11): 100 mg via ORAL
  Filled 2024-02-06 (×13): qty 1

## 2024-02-06 MED ORDER — CHLORHEXIDINE GLUCONATE 0.12 % MT SOLN
15.0000 mL | Freq: Once | OROMUCOSAL | Status: AC
  Administered 2024-02-06: 15 mL via OROMUCOSAL
  Filled 2024-02-06: qty 15

## 2024-02-06 MED ORDER — ACETAMINOPHEN 10 MG/ML IV SOLN
INTRAVENOUS | Status: AC
  Filled 2024-02-06: qty 100

## 2024-02-06 MED ORDER — MORPHINE SULFATE (PF) 2 MG/ML IV SOLN
1.0000 mg | Freq: Once | INTRAVENOUS | Status: AC
  Administered 2024-02-06: 1 mg via INTRAVENOUS
  Filled 2024-02-06: qty 1

## 2024-02-06 MED ORDER — ACETAMINOPHEN 650 MG RE SUPP: 650.0000 mg | Freq: Four times a day (QID) | RECTAL | Status: DC | PRN

## 2024-02-06 MED ORDER — ROSUVASTATIN CALCIUM 20 MG PO TABS
20.0000 mg | ORAL_TABLET | Freq: Every day | ORAL | Status: DC
Start: 2024-02-06 — End: 2024-02-13
  Administered 2024-02-07 – 2024-02-13 (×7): 20 mg via ORAL
  Filled 2024-02-06 (×5): qty 1
  Filled 2024-02-06: qty 4
  Filled 2024-02-06: qty 1

## 2024-02-06 MED ORDER — MORPHINE SULFATE (PF) 2 MG/ML IV SOLN: 0.5000 mg | INTRAVENOUS | Status: DC | PRN

## 2024-02-06 MED ORDER — LACTATED RINGERS IV SOLN: INTRAVENOUS | Status: DC

## 2024-02-06 MED ORDER — ZOLPIDEM TARTRATE 5 MG PO TABS
5.0000 mg | ORAL_TABLET | Freq: Every evening | ORAL | Status: DC | PRN
  Administered 2024-02-07 – 2024-02-09 (×3): 5 mg via ORAL
  Filled 2024-02-06 (×3): qty 1

## 2024-02-06 MED ORDER — HYDROCODONE-ACETAMINOPHEN 5-325 MG PO TABS
1.0000 | ORAL_TABLET | ORAL | Status: DC | PRN
  Administered 2024-02-06 – 2024-02-13 (×6): 1 via ORAL
  Filled 2024-02-06 (×6): qty 1

## 2024-02-06 MED ORDER — LIDOCAINE 2% (20 MG/ML) 5 ML SYRINGE
INTRAMUSCULAR | Status: DC | PRN
  Administered 2024-02-06: 40 mg via INTRAVENOUS

## 2024-02-06 MED ORDER — CEFAZOLIN SODIUM-DEXTROSE 2-4 GM/100ML-% IV SOLN
2.0000 g | Freq: Once | INTRAVENOUS | Status: AC
  Administered 2024-02-06: 2 g via INTRAVENOUS

## 2024-02-06 MED ORDER — LEVOTHYROXINE SODIUM 25 MCG PO TABS
25.0000 ug | ORAL_TABLET | Freq: Every day | ORAL | Status: DC
  Administered 2024-02-07 – 2024-02-13 (×7): 25 ug via ORAL
  Filled 2024-02-06 (×7): qty 1

## 2024-02-06 MED ORDER — ESCITALOPRAM OXALATE 10 MG PO TABS
10.0000 mg | ORAL_TABLET | Freq: Two times a day (BID) | ORAL | Status: DC
  Administered 2024-02-06 – 2024-02-13 (×14): 10 mg via ORAL
  Filled 2024-02-06 (×14): qty 1

## 2024-02-06 MED ORDER — FENTANYL CITRATE (PF) 250 MCG/5ML IJ SOLN
INTRAMUSCULAR | Status: AC
  Filled 2024-02-06: qty 5

## 2024-02-06 MED ORDER — FENTANYL CITRATE (PF) 100 MCG/2ML IJ SOLN
INTRAMUSCULAR | Status: AC
  Filled 2024-02-06: qty 2

## 2024-02-06 MED ORDER — SODIUM CHLORIDE 0.9 % IV BOLUS
500.0000 mL | Freq: Once | INTRAVENOUS | Status: AC
  Administered 2024-02-06: 500 mL via INTRAVENOUS

## 2024-02-06 MED ORDER — ACETAMINOPHEN 10 MG/ML IV SOLN
500.0000 mg | Freq: Once | INTRAVENOUS | Status: DC | PRN
  Administered 2024-02-06: 500 mg via INTRAVENOUS

## 2024-02-06 MED ORDER — ACETAMINOPHEN 325 MG PO TABS
325.0000 mg | ORAL_TABLET | Freq: Four times a day (QID) | ORAL | Status: DC | PRN
  Administered 2024-02-07 (×2): 325 mg via ORAL
  Administered 2024-02-08 – 2024-02-12 (×3): 650 mg via ORAL
  Filled 2024-02-06 (×6): qty 2

## 2024-02-06 MED ORDER — ONDANSETRON HCL 4 MG/2ML IJ SOLN: 4.0000 mg | Freq: Once | INTRAMUSCULAR | Status: DC | PRN

## 2024-02-06 MED ORDER — SODIUM CHLORIDE 0.9 % IV BOLUS: 1000.0000 mL | Freq: Once | INTRAVENOUS | Status: DC

## 2024-02-06 MED ORDER — PHENYLEPHRINE 80 MCG/ML (10ML) SYRINGE FOR IV PUSH (FOR BLOOD PRESSURE SUPPORT)
PREFILLED_SYRINGE | INTRAVENOUS | Status: DC | PRN
  Administered 2024-02-06: 80 ug via INTRAVENOUS

## 2024-02-06 MED ORDER — FENTANYL CITRATE (PF) 250 MCG/5ML IJ SOLN
INTRAMUSCULAR | Status: DC | PRN
  Administered 2024-02-06: 50 ug via INTRAVENOUS

## 2024-02-06 MED ORDER — HYDROCODONE-ACETAMINOPHEN 7.5-325 MG PO TABS
1.0000 | ORAL_TABLET | ORAL | Status: DC | PRN
  Administered 2024-02-07 – 2024-02-12 (×14): 1 via ORAL
  Filled 2024-02-06 (×14): qty 1

## 2024-02-06 MED ORDER — 0.9 % SODIUM CHLORIDE (POUR BTL) OPTIME
TOPICAL | Status: DC | PRN
  Administered 2024-02-06: 1000 mL

## 2024-02-06 MED ORDER — PROPOFOL 10 MG/ML IV BOLUS
INTRAVENOUS | Status: AC
  Filled 2024-02-06: qty 20

## 2024-02-06 MED ORDER — OXYCODONE HCL 5 MG PO TABS: 5.0000 mg | ORAL_TABLET | Freq: Once | ORAL | Status: DC | PRN

## 2024-02-06 MED ORDER — METOPROLOL SUCCINATE ER 50 MG PO TB24
75.0000 mg | ORAL_TABLET | Freq: Every day | ORAL | Status: DC
  Administered 2024-02-08 – 2024-02-13 (×6): 75 mg via ORAL
  Filled 2024-02-06 (×7): qty 1

## 2024-02-06 MED ORDER — KETOROLAC TROMETHAMINE 15 MG/ML IJ SOLN
15.0000 mg | Freq: Once | INTRAMUSCULAR | Status: AC
  Administered 2024-02-06: 15 mg via INTRAMUSCULAR
  Filled 2024-02-06: qty 1

## 2024-02-06 MED ORDER — ORAL CARE MOUTH RINSE: 15.0000 mL | Freq: Once | OROMUCOSAL | Status: AC

## 2024-02-06 MED ORDER — OXYCODONE HCL 5 MG/5ML PO SOLN: 5.0000 mg | Freq: Once | ORAL | Status: DC | PRN

## 2024-02-06 MED ORDER — ENOXAPARIN SODIUM 40 MG/0.4ML IJ SOSY
40.0000 mg | PREFILLED_SYRINGE | INTRAMUSCULAR | Status: DC
  Administered 2024-02-07 – 2024-02-13 (×7): 40 mg via SUBCUTANEOUS
  Filled 2024-02-06 (×7): qty 0.4

## 2024-02-06 MED ORDER — FENTANYL CITRATE (PF) 100 MCG/2ML IJ SOLN
12.5000 ug | INTRAMUSCULAR | Status: DC | PRN
  Administered 2024-02-06: 12.5 ug via INTRAVENOUS

## 2024-02-06 MED ORDER — ALPRAZOLAM 0.25 MG PO TABS
0.2500 mg | ORAL_TABLET | Freq: Two times a day (BID) | ORAL | Status: DC | PRN
  Administered 2024-02-06 – 2024-02-13 (×11): 0.25 mg via ORAL
  Filled 2024-02-06 (×11): qty 1

## 2024-02-06 MED ORDER — LABETALOL HCL 5 MG/ML IV SOLN
INTRAVENOUS | Status: DC | PRN
  Administered 2024-02-06: 7.5 mg via INTRAVENOUS

## 2024-02-06 MED ORDER — MORPHINE SULFATE (PF) 4 MG/ML IV SOLN
4.0000 mg | Freq: Once | INTRAVENOUS | Status: AC
  Administered 2024-02-06: 4 mg via INTRAVENOUS
  Filled 2024-02-06: qty 1

## 2024-02-06 MED ORDER — ACETAMINOPHEN 325 MG PO TABS: 650.0000 mg | ORAL_TABLET | Freq: Four times a day (QID) | ORAL | Status: DC | PRN

## 2024-02-06 MED ORDER — CEFAZOLIN SODIUM-DEXTROSE 2-4 GM/100ML-% IV SOLN
INTRAVENOUS | Status: AC
  Filled 2024-02-06: qty 100

## 2024-02-06 MED ORDER — PANTOPRAZOLE SODIUM 40 MG PO TBEC
40.0000 mg | DELAYED_RELEASE_TABLET | Freq: Every day | ORAL | Status: DC
  Administered 2024-02-07 – 2024-02-13 (×7): 40 mg via ORAL
  Filled 2024-02-06 (×7): qty 1

## 2024-02-06 MED ORDER — ROCURONIUM BROMIDE 10 MG/ML (PF) SYRINGE
PREFILLED_SYRINGE | INTRAVENOUS | Status: DC | PRN
Start: 2024-02-06 — End: 2024-02-06
  Administered 2024-02-06: 20 mg via INTRAVENOUS
  Administered 2024-02-06: 30 mg via INTRAVENOUS

## 2024-02-06 MED ORDER — PROCHLORPERAZINE EDISYLATE 10 MG/2ML IJ SOLN
5.0000 mg | INTRAMUSCULAR | Status: DC | PRN
  Administered 2024-02-06: 5 mg via INTRAVENOUS
  Filled 2024-02-06: qty 2

## 2024-02-06 MED ORDER — ASPIRIN 81 MG PO TBEC
81.0000 mg | DELAYED_RELEASE_TABLET | Freq: Every day | ORAL | Status: DC
  Administered 2024-02-07 – 2024-02-13 (×7): 81 mg via ORAL
  Filled 2024-02-06 (×7): qty 1

## 2024-02-06 MED ORDER — EPHEDRINE SULFATE-NACL 50-0.9 MG/10ML-% IV SOSY
PREFILLED_SYRINGE | INTRAVENOUS | Status: DC | PRN
  Administered 2024-02-06: 10 mg via INTRAVENOUS

## 2024-02-06 MED ORDER — VITAMIN B-6 100 MG PO TABS
500.0000 mg | ORAL_TABLET | Freq: Every day | ORAL | Status: DC
  Administered 2024-02-07 – 2024-02-13 (×7): 500 mg via ORAL
  Filled 2024-02-06 (×9): qty 5

## 2024-02-06 SURGICAL SUPPLY — 63 items
BAG COUNTER SPONGE SURGICOUNT (BAG) ×1 IMPLANT
BIT DRILL QC LG EVOS 3.7 SN (DRILL) IMPLANT
BLADE CLIPPER SURG (BLADE) IMPLANT
BNDG ELASTIC 4X5.8 VLCR STR LF (GAUZE/BANDAGES/DRESSINGS) ×1 IMPLANT
BNDG ELASTIC 6INX 5YD STR LF (GAUZE/BANDAGES/DRESSINGS) ×1 IMPLANT
BNDG GAUZE DERMACEA FLUFF 4 (GAUZE/BANDAGES/DRESSINGS) ×1 IMPLANT
BRUSH SCRUB EZ PLAIN DRY (MISCELLANEOUS) ×2 IMPLANT
CANISTER SUCTION 3000ML PPV (SUCTIONS) ×1 IMPLANT
COVER SURGICAL LIGHT HANDLE (MISCELLANEOUS) ×1 IMPLANT
DRAPE C-ARM 42X72 X-RAY (DRAPES) ×1 IMPLANT
DRAPE C-ARMOR (DRAPES) ×1 IMPLANT
DRAPE IMP U-DRAPE 54X76 (DRAPES) ×1 IMPLANT
DRAPE SURG ORHT 6 SPLT 77X108 (DRAPES) ×3 IMPLANT
DRAPE U-SHAPE 47X51 STRL (DRAPES) ×1 IMPLANT
DRSG ADAPTIC 3X8 NADH LF (GAUZE/BANDAGES/DRESSINGS) ×1 IMPLANT
DRSG MEPILEX POST OP 4X8 (GAUZE/BANDAGES/DRESSINGS) IMPLANT
ELECTRODE REM PT RTRN 9FT ADLT (ELECTROSURGICAL) ×1 IMPLANT
EVACUATOR 1/8 PVC DRAIN (DRAIN) IMPLANT
EVACUATOR 3/16 PVC DRAIN (DRAIN) IMPLANT
GAUZE PAD ABD 8X10 STRL (GAUZE/BANDAGES/DRESSINGS) ×4 IMPLANT
GAUZE SPONGE 4X4 12PLY STRL (GAUZE/BANDAGES/DRESSINGS) ×1 IMPLANT
GLOVE BIO SURGEON STRL SZ7.5 (GLOVE) ×1 IMPLANT
GLOVE BIO SURGEON STRL SZ8 (GLOVE) ×1 IMPLANT
GLOVE BIOGEL PI IND STRL 7.5 (GLOVE) ×1 IMPLANT
GLOVE BIOGEL PI IND STRL 8 (GLOVE) ×1 IMPLANT
GLOVE SURG ORTHO LTX SZ7.5 (GLOVE) ×2 IMPLANT
GOWN STRL REUS W/ TWL LRG LVL3 (GOWN DISPOSABLE) ×2 IMPLANT
GOWN STRL REUS W/ TWL XL LVL3 (GOWN DISPOSABLE) ×1 IMPLANT
KIT BASIN OR (CUSTOM PROCEDURE TRAY) ×1 IMPLANT
KIT TURNOVER KIT B (KITS) ×1 IMPLANT
KWIRE FIX TT 2X350 (WIRE) IMPLANT
NDL 22X1.5 STRL (OR ONLY) (MISCELLANEOUS) IMPLANT
NEEDLE 22X1.5 STRL (OR ONLY) (MISCELLANEOUS) IMPLANT
NS IRRIG 1000ML POUR BTL (IV SOLUTION) ×1 IMPLANT
PACK TOTAL JOINT (CUSTOM PROCEDURE TRAY) ×1 IMPLANT
PACK UNIVERSAL I (CUSTOM PROCEDURE TRAY) ×1 IMPLANT
PAD ARMBOARD POSITIONER FOAM (MISCELLANEOUS) ×2 IMPLANT
PAD CAST 4YDX4 CTTN HI CHSV (CAST SUPPLIES) ×1 IMPLANT
PADDING CAST COTTON 6X4 STRL (CAST SUPPLIES) ×1 IMPLANT
PLATE DIST FEM EVOS 270 13H RT (Plate) IMPLANT
SCREW CORT ST EVOS 4.5X28 (Screw) IMPLANT
SCREW EVOS S-T CTX ST 4.5X34 (Screw) IMPLANT
SCREW LOCK FT EVOS 6.7X75 (Screw) IMPLANT
SCREW LOCK ST EVOS 4.5X24 (Screw) IMPLANT
SCREW LOCK ST EVOS 4.5X30 (Screw) IMPLANT
SCREW LOCK ST EVOS 4.5X68 STL (Screw) IMPLANT
SCREW LOCK ST EVOS 4.5X72 (Screw) IMPLANT
SCREW LOCK ST EVOS 4.5X80 (Screw) IMPLANT
SCREW S-T CORTEX 4.5X48 (Screw) IMPLANT
SCREW S-T CORTEX 4.5X78 (Screw) IMPLANT
SPONGE T-LAP 18X18 ~~LOC~~+RFID (SPONGE) ×1 IMPLANT
STAPLER SKIN PROX 35W (STAPLE) ×1 IMPLANT
SUCTION TUBE FRAZIER 10FR DISP (SUCTIONS) ×1 IMPLANT
SUT ETHILON 2 0 PSLX (SUTURE) IMPLANT
SUT PROLENE 0 CT 2 (SUTURE) IMPLANT
SUT VIC AB 0 CT1 27XBRD ANBCTR (SUTURE) ×2 IMPLANT
SUT VIC AB 1 CT1 27XBRD ANBCTR (SUTURE) ×2 IMPLANT
SUT VIC AB 2-0 CT1 TAPERPNT 27 (SUTURE) ×2 IMPLANT
SYR 20ML ECCENTRIC (SYRINGE) IMPLANT
TOWEL GREEN STERILE (TOWEL DISPOSABLE) ×2 IMPLANT
TOWEL GREEN STERILE FF (TOWEL DISPOSABLE) ×1 IMPLANT
TRAY FOLEY MTR SLVR 16FR STAT (SET/KITS/TRAYS/PACK) IMPLANT
WATER STERILE IRR 1000ML POUR (IV SOLUTION) ×2 IMPLANT

## 2024-02-06 NOTE — ED Notes (Cosign Needed Addendum)
 0.92ml/1mg  of Morphine  wasted via sharps container. Discussed with ED Charge RN Odetta Bunde, RN.

## 2024-02-06 NOTE — Transfer of Care (Signed)
 Immediate Anesthesia Transfer of Care Note  Patient: [name]  Procedure(s) Performed: OPEN REDUCTION INTERNAL FIXATION (ORIF) DISTAL FEMUR FRACTURE (Right)  Patient Location: PACU  Anesthesia Type:General  Level of Consciousness: awake, alert , and oriented  Airway & Oxygen Therapy: Patient Spontanous Breathing and Patient connected to face mask oxygen  Post-op Assessment: Report given to RN and Post -op Vital signs reviewed and stable  Post vital signs: Reviewed and stable  Last Vitals:  Vitals Value Taken Time  BP 129/70 02/06/24 17:03  Temp    Pulse 86 02/06/24 17:07  Resp 10 02/06/24 17:07  SpO2 100 % 02/06/24 17:07  Vitals shown include unfiled device data.  Last Pain:  Vitals:   02/06/24 1341  TempSrc: Oral  PainSc:          Complications: No notable events documented.

## 2024-02-06 NOTE — ED Notes (Addendum)
 Pt was able to use fractured hip bedpan, and has a clean brief on, Urine sample sent to lab incase one is needed

## 2024-02-06 NOTE — Consult Note (Signed)
 ORTHOPAEDIC CONSULTATION  REQUESTING PHYSICIAN: Opyd, Evalene RAMAN, MD  PCP:  Shepard Ade, MD  Chief Complaint: Right knee pain   HPI: [name] is a 89 y.o. female who complains of  right knee pain after sustaining a fall earlier this morning which started pain in her her right knee. The patient's daughter Holley is present in the room and is able to help with the patient history interview. Patient endorses that her right leg just gave out underneath her and that caused her to fall. She states that typically her left leg is the weaker LE. Since the fall she has had increased pain of the right knee and LE. She denies LOC, dizziness, n/v, head trauma. Denies thinners. Patient states that the duration of the pain is constant. She states that ROM aggravates her pain. She denies any radiation of her pain.   Past Medical History:  Diagnosis Date   Anxiety    Arthritis    Chronic headaches    hx - now just occasional HA per patient   Colon polyps    Complication of anesthesia    Elevated blood pressure after having last Kyphoplasty; pt stated I was given Morphine  and had to stay overnight   Coronary artery disease    Depression    DI (detrusor instability)    Dilated cardiomyopathy (HCC) 10/04/2022   TTE 10/03/22: EF 42, mild MR, ascending aorta 38 mm   Fracture of vertebra    x 3   GERD (gastroesophageal reflux disease)    Heart failure with mildly reduced ejection fraction (HFmrEF) (HCC) 01/18/2023   - CCTA 10/07/2006: CAC score 1; LAD < 30% soft plaque   - Myoview 03/30/2015: EF 83, no ischemia; low risk  - TTE 04/21/2021: EF 60-65, no RWMA, Gr 1 DD, normal RVSF, trivial MR, trivial AI - TTE 10/03/22: EF 42, global HK, normal RVSF, mild MR, ascending aorta dilation (38 mm), RAP 3    History of blood transfusion 2011   w/ knee replacement surgery   History of hiatal hernia    small - does not cause any problems per patient   HLD (hyperlipidemia)    Hypertension     Hypothyroidism    Menopausal symptoms    MGUS (monoclonal gammopathy of unknown significance) 12/27/2017   IgA   Osteoporosis    Peripheral neuropathy 12/27/2017   Pneumonia    Restless leg syndrome    Stroke (HCC) 03/2021   mini strokes   TMJ click    Varicose veins    Past Surgical History:  Procedure Laterality Date   ABDOMINAL HYSTERECTOMY  1992   TAH,BSO   BLADDER SUSPENSION  2011   CRYOMESH   CARPAL TUNNEL RELEASE Bilateral 1982   CHOLECYSTECTOMY  1992   COLONOSCOPY     EYE SURGERY Bilateral    Cataract removal   IR THORACENTESIS ASP PLEURAL SPACE W/IMG GUIDE  05/28/2021   KYPHOPLASTY     X 3   KYPHOPLASTY N/A 08/02/2021   Procedure: Thoracic Twelve KYPHOPLASTY;  Surgeon: Colon Shove, MD;  Location: [facility] OR;  Service: Neurosurgery;  Laterality: N/A;   LUMBAR LAMINECTOMY WITH COFLEX 1 LEVEL N/A 12/29/2015   Procedure: L3-4 Laminectomy with Coflex;  Surgeon: Shove Colon, MD;  Location: [facility];  Service: Neurosurgery;  Laterality: N/A;  L3-4 Laminectomy with coflex   OOPHORECTOMY     BSO   REPLACEMENT TOTAL KNEE Bilateral 2008,  2011   RIGHT 2008, LEFT 2011   REVERSE SHOULDER  ARTHROPLASTY Right 01/30/2020   Procedure: REVERSE SHOULDER ARTHROPLASTY;  Surgeon: Sharl Selinda Dover, MD;  Location: [facility] ORS;  Service: Orthopedics;  Laterality: Right;  2.5 hrs   SPINAL FUSION  2011   VERTEBRAL SURGERY     T-8, T-10. T-12  DR CHRISS   Social History   Socioeconomic History   Marital status: Widowed    Spouse name: Not on file   Number of children: 2   Years of education: 65   Highest education level: Not on file  Occupational History   Occupation: Retired  Tobacco Use   Smoking status: Never   Smokeless tobacco: Never  Vaping Use   Vaping status: Never Used  Substance and Sexual Activity   Alcohol  use: No   Drug use: No   Sexual activity: Not Currently    Birth control/protection: Surgical    Comment: Hysterectomy  Other Topics Concern   Not on file   Social History Narrative   Lives  alone   Caffeine use: decaf only   Right handed    Social Drivers of Corporate investment banker Strain: Not on file  Food Insecurity: Not on file  Transportation Needs: Not on file  Physical Activity: Not on file  Stress: Not on file  Social Connections: Unknown (10/09/2021)   Received from [facility]   Social Network    Social Network: Not on file   Family History  Problem Relation Age of Onset   Heart disease Mother    Heart disease Father    Stroke Father    Hypertension Sister    Diabetes Sister    Ovarian cancer Sister    Diabetes Sister    Rectal cancer Sister        ? Colon or rectal   Hyperlipidemia Sister    Hypertension Sister    Hyperlipidemia Sister    Hypertension Sister    Hypertension Brother    Heart disease Brother    Heart disease Brother    Hypertension Brother    Heart disease Brother    Heart disease Brother    Heart disease Brother    Heart disease Brother    Breast cancer Niece    Heart disease Son    Hyperlipidemia Son    Hyperlipidemia Daughter    Hypertension Daughter    No Known Allergies Prior to Admission medications   Medication Sig Start Date End Date Taking? Authorizing Provider  ALPRAZolam  (XANAX ) 0.5 MG tablet Take 0.25 mg by mouth 2 (two) times daily as needed (restless legs).   Yes [provider]  Ascorbic Acid (VITAMIN C PO) Take 1 tablet by mouth daily.   Yes [provider]  aspirin  EC 81 MG tablet Take 1 tablet (81 mg total) by mouth daily. Swallow whole. 02/20/23  Yes Weaver, Scott T, PA-C  Calcium  Carbonate-Vitamin D  (CALCIUM -D PO) Take 1 tablet by mouth daily.   Yes [provider]  celecoxib  (CELEBREX ) 200 MG capsule Take 1 tablet by mouth as needed. 09/28/20  Yes [provider]  dexlansoprazole  (DEXILANT ) 60 MG capsule Take 60 mg by mouth every morning.   Yes [provider]  escitalopram  (LEXAPRO ) 10 MG tablet Take 10 mg by mouth in  the morning and at bedtime.   Yes [provider]  levothyroxine  (SYNTHROID , LEVOTHROID) 25 MCG tablet Take 25 mcg by mouth daily before breakfast.    Yes [provider]  losartan  (COZAAR ) 100 MG tablet Take 1 tablet (100 mg total) by mouth  daily. Take once daily at bedtime Patient taking differently: Take 50 mg by mouth 2 (two) times daily. Patient could not tolerate 100 mg at once 10/04/23  Yes Thukkani, Arun K, MD  metoprolol  succinate (TOPROL -XL) 50 MG 24 hr tablet Take 1.5 tablets (75 mg total) by mouth daily. Take with or immediately following a meal. 12/28/22  Yes Cindie Ole DASEN, MD  Multiple Vitamin (MULTIVITAMIN WITH MINERALS) TABS tablet Take 1 tablet by mouth daily.   Yes [provider]  Probiotic Product (ALIGN) 4 MG CAPS 1 capsule by mouth once a day   Yes [provider]  pyridoxine  (B-6) 500 MG tablet Take 1 tablet (500 mg total) by mouth daily. 02/24/22  Yes Ennever, Maude SAUNDERS, MD  rosuvastatin  (CRESTOR ) 20 MG tablet Take 1 tablet (20 mg total) by mouth daily. Patient taking differently: Take 20 mg by mouth See admin instructions. Takes 3 times per week. 04/22/21  Yes Jerri Keys, MD  spironolactone  (ALDACTONE ) 25 MG tablet TAKE 1 TABLET (25 MG TOTAL) BY MOUTH DAILY. 12/11/23  Yes Thukkani, Arun K, MD  Vitamin D , Ergocalciferol , (DRISDOL ) 50000 UNITS CAPS Take 50,000 Units by mouth every Friday.   Yes [provider]  VITAMIN E PO Take 1 capsule by mouth daily.   Yes [provider]  zolpidem  (AMBIEN  CR) 12.5 MG CR tablet Take 12.5 mg by mouth at bedtime.   Yes [provider]  cefadroxil  (DURICEF) 500 MG capsule Take 1 capsule (500 mg total) by mouth 2 (two) times daily. Patient not taking: Reported on 02/06/2024 01/19/24   Theotis Cameron HERO, PA-C  [facility]) POWD Take 1 daily dose Patient not taking: Reported on 02/06/2024 02/09/21   Eda Iha, MD   DG CHEST PORT 1 VIEW Result Date: 02/06/2024 EXAM: 1  VIEW XRAY OF THE CHEST 02/06/2024 06:16:00 AM COMPARISON: 01/19/2024 previous right shoulder arthroplasty. CLINICAL HISTORY: [id number] Closed fracture of right distal femur [facility]) [id number]; [id number] Preoperative examination [id number]. Pre-op,femur fracture FINDINGS: LUNGS AND PLEURA: No pleural fluid, interstitial edema or airspace consolidation. HEART AND MEDIASTINUM: Stable cardiomediastinal contours. BONES AND SOFT TISSUES: Postoperative changes noted within the cervical spine. Loop recorder is noted within the projection of the left lower chest. Atherosclerotic calcifications. Signs of previous kyphoplasty within the thoracic and lumbar spine. Remote healed left posterior seventh, eighth and ninth rib fractures. STOMACH AND BOWEL: Moderate-sized hiatal hernia. IMPRESSION: 1. No acute cardiopulmonary pathology. 2. Moderate-sized hiatal hernia. Electronically signed by: Waddell Calk MD 02/06/2024 06:33 AM EDT RP Workstation: [id number]   CT FEMUR RIGHT WO CONTRAST Result Date: 02/06/2024 EXAM: CT OF THE RIGHT FEMUR, WITHOUT IV CONTRAST 02/06/2024 05:20:00 AM TECHNIQUE: Axial images were acquired through the right femur without IV contrast. Reformatted images were reviewed. Automated exposure control, iterative reconstruction, and/or weight based adjustment of the mA/kV was utilized to reduce the radiation dose to as low as reasonably achievable. COMPARISON: Plain film radiographs of the right femur from 01/03/2023. CLINICAL HISTORY: Periprosthetic fracture, surgical planning. Please include full length of femur. FINDINGS: BONES: Comminuted, periprosthetic fracture of the distal right femur status post right total knee arthroplasty. The fracture fragments appear impacted with 90 degrees lateral rotation of the distal fracture fragments with approximately 45 degree proximal angulation and approximately 4 cm impaction. Impacted fracture fragments extend up to The cement bone interface within the central aspect of the distal  femoral metaphysis. Moderate right hip osteoarthritis. JOINTS: No dislocation. The joint spaces are normal. SOFT TISSUES: Signs of intramuscular edema/hematoma identified within the  anterior and lateral aspects of the quadriceps muscle. IMPRESSION: 1. Comminuted, periprosthetic fracture of the distal right femur status post right total knee arthroplasty. 2. Signs of intramuscular edema/hematoma within the anterior and lateral aspects of the quadriceps muscle. 3. Moderate right hip osteoarthritis. Electronically signed by: Waddell Calk MD 02/06/2024 05:59 AM EDT RP Workstation: [id number]   DG Knee 1-2 Views Right Result Date: 02/06/2024 EXAM: 1 or 2 VIEW(S) XRAY OF THE RIGHT KNEE 02/06/2024 03:53:00 AM COMPARISON: None available. CLINICAL HISTORY: [id number] Fall 190176. Patient fell a few hours ago and injured leg, unable to bear weight, obvious deformity. FINDINGS: BONES AND JOINTS: Oblique periprosthetic fracture of the distal right femur. The fracture line extends to the anterior tip of the femoral component of the right total knee arthroplasty. There is apex posterior angulation of the fracture. Moderate knee joint effusion. SOFT TISSUES: The soft tissues are unremarkable. IMPRESSION: 1. Oblique periprosthetic fracture of the distal right femur. Electronically signed by: Norman Gatlin MD 02/06/2024 04:02 AM EDT RP Workstation: [id number]    Positive ROS: All other systems have been reviewed and were otherwise negative with the exception of those mentioned in the HPI and as above.  Physical Exam: General: Alert, no acute distress Cardiovascular: No pedal edema Respiratory: No cyanosis, no use of accessory musculature GI: No organomegaly, abdomen is soft and non-tender Skin: No lesions in the area of chief complaint Neurologic: Sensation intact distally Psychiatric: Patient is competent for consent with normal mood and affect Lymphatic: No axillary or cervical lymphadenopathy  MUSCULOSKELETAL:  Edema of the right upper LE present along with visible deformity of the distal right femur. Pain with palpation of the right knee and distal right femur. No erythema or warmth present on palpation. There is significant pain with attempted ROM of the right LE. Strength unable to be assessed due to pain. Distal pulses intact bilaterally. Capillary refill is WNL. Normal sensation bilaterally in the distal LE. Immobilizer is present along with traction   Assessment: [name] is a veyr pleasant 89 year old female who presented to the ED this morning after a fall that caused right knee pain. She does have a peri-prosthetic distal comminuted right femur fracture.    Plan: -Continue pain medical management  -Continue traction  -Continue knee immobilizer  -Continue NPO status  -Plan to transfer to [facility] -Plan for surgical intervention with Dr. Celena the orthopedic traumatologist today     Jeoffrey LOISE Sages, PA-C for Dr. Donaciano Sprang, MD Cell [phone]   02/06/2024 7:40 AM

## 2024-02-06 NOTE — ED Triage Notes (Signed)
 PT bib [facility] c/c of a fall with pain to her right knee. PT denies hitting her head, loc, or dizziness

## 2024-02-06 NOTE — Anesthesia Preprocedure Evaluation (Signed)
 Anesthesia Evaluation  Patient identified by MRN, date of birth, ID band Patient awake    Reviewed: Allergy & Precautions, NPO status , Patient's Chart, lab work & pertinent test results  History of Anesthesia Complications (+) PONV and history of anesthetic complications  Airway Mallampati: III  TM Distance: >3 FB Neck ROM: Full    Dental no notable dental hx. (+) Teeth Intact   Pulmonary neg pulmonary ROS, neg sleep apnea, neg COPD, Patient abstained from smoking.Not current smoker   Pulmonary exam normal breath sounds clear to auscultation       Cardiovascular Exercise Tolerance: Good METShypertension, Pt. on medications + CAD  (-) Past MI (-) dysrhythmias  Rhythm:Regular Rate:Normal - Systolic murmurs    Neuro/Psych  Headaches PSYCHIATRIC DISORDERS Anxiety Depression    CVA, No Residual Symptoms    GI/Hepatic hiatal hernia,GERD  Controlled,,(+)     (-) substance abuse    Endo/Other  neg diabetesHypothyroidism    Renal/GU negative Renal ROS     Musculoskeletal   Abdominal   Peds  Hematology   Anesthesia Other Findings Past Medical History: No date: Anxiety No date: Arthritis No date: Chronic headaches     Comment:  hx - now just occasional HA per patient No date: Colon polyps No date: Complication of anesthesia     Comment:  Elevated blood pressure after having last Kyphoplasty;               pt stated I was given Morphine  and had to stay               overnight No date: Coronary artery disease No date: Depression No date: DI (detrusor instability) 10/04/2022: Dilated cardiomyopathy (HCC)     Comment:  TTE 10/03/22: EF 42, mild MR, ascending aorta 38 mm No date: Fracture of vertebra     Comment:  x 3 No date: GERD (gastroesophageal reflux disease) 01/18/2023: Heart failure with mildly reduced ejection fraction  (HFmrEF) (HCC)     Comment:  - CCTA 10/07/2006: CAC score 1; LAD < 30% soft plaque                  - Myoview 03/30/2015: EF 83, no ischemia; low risk  -               TTE 04/21/2021: EF 60-65, no RWMA, Gr 1 DD, normal RVSF,               trivial MR, trivial AI - TTE 10/03/22: EF 42, global HK,               normal RVSF, mild MR, ascending aorta dilation (38 mm),               RAP 3  2011: History of blood transfusion     Comment:  w/ knee replacement surgery No date: History of hiatal hernia     Comment:  small - does not cause any problems per patient No date: HLD (hyperlipidemia) No date: Hypertension No date: Hypothyroidism No date: Menopausal symptoms 12/27/2017: MGUS (monoclonal gammopathy of unknown significance)     Comment:  IgA No date: Osteoporosis 12/27/2017: Peripheral neuropathy No date: Pneumonia No date: Restless leg syndrome 03/2021: Stroke [facility])     Comment:  mini strokes No date: TMJ click No date: Varicose veins  Reproductive/Obstetrics  Anesthesia Physical Anesthesia Plan  ASA: 3  Anesthesia Plan: General   Post-op Pain Management: Ofirmev  IV (intra-op)*   Induction: Intravenous  PONV Risk Score and Plan: 4 or greater and Ondansetron , Dexamethasone  and Treatment may vary due to age or medical condition  Airway Management Planned: Oral ETT  Additional Equipment: None  Intra-op Plan:   Post-operative Plan: Extubation in OR  Informed Consent: I have reviewed the patients History and Physical, chart, labs and discussed the procedure including the risks, benefits and alternatives for the proposed anesthesia with the patient or authorized representative who has indicated his/her understanding and acceptance.     Dental advisory given  Plan Discussed with: CRNA and Surgeon  Anesthesia Plan Comments: (Discussed risks of anesthesia with patient and son and daughter at bedside, including PONV, sore throat, lip/dental/eye damage, post operative cognitive dysfunction. Rare risks discussed  as well, such as cardiorespiratory and neurological sequelae, and allergic reactions. Discussed the role of CRNA in patient's perioperative care. Patient understands. Confirmed with patient that she is full code.)        Anesthesia Quick Evaluation

## 2024-02-06 NOTE — ED Notes (Signed)
 Bruising and abrasion noted to right hip. Provider made aware.

## 2024-02-06 NOTE — Consult Note (Signed)
 Orthopaedic Trauma Service (OTS) Consultation   Patient ID: [name] MRN: [id number] DOB/AGE: [birth year] 89 y.o.   Reason for Consult: Right supracondylar femur fracture Referring Physician: Donaciano Sprang, MD Yvonne Sprinkles, MD  HPI: [name] is an 89 y.o. female with very comminuted right distal femur supracondylar fracture. Given the complexity of the injury and overlap with the implant, Dr. Sprang asserted this was outside his scope of practice and that it would be in the best interest of the patient to have these injuries evaluated and treated by a fellowship trained orthopaedic traumatologist. Consequently, I was consulted to provide further evaluation and management. Patient has had recent UTI rx'd Cefodroxil, started two weeks ago, was given 2 wk course, and has had significant rash on right thigh, very mild both arms for 4 days preceding this injury. Pain is  moderately well controlled now, aching and dull, sharp and severe with motion, without associated distal tingling or numbness, and improved with narcotics. She is in hare traction. HAs used rollator since stroke several years ago. Lives independently and alone but son and daughter are nearby.    Past Medical History:  Diagnosis Date   Anxiety    Arthritis    Chronic headaches    hx - now just occasional HA per patient   Colon polyps    Complication of anesthesia    Elevated blood pressure after having last Kyphoplasty; pt stated I was given Morphine  and had to stay overnight   Coronary artery disease    Depression    DI (detrusor instability)    Dilated cardiomyopathy (HCC) 10/04/2022   TTE 10/03/22: EF 42, mild MR, ascending aorta 38 mm   Fracture of vertebra    x 3   GERD (gastroesophageal reflux disease)    Heart failure with mildly reduced ejection fraction (HFmrEF) (HCC) 01/18/2023   - CCTA 10/07/2006: CAC score 1; LAD < 30% soft plaque   - Myoview 03/30/2015: EF 83, no ischemia; low risk  -  TTE 04/21/2021: EF 60-65, no RWMA, Gr 1 DD, normal RVSF, trivial MR, trivial AI - TTE 10/03/22: EF 42, global HK, normal RVSF, mild MR, ascending aorta dilation (38 mm), RAP 3    History of blood transfusion 2011   w/ knee replacement surgery   History of hiatal hernia    small - does not cause any problems per patient   HLD (hyperlipidemia)    Hypertension    Hypothyroidism    Menopausal symptoms    MGUS (monoclonal gammopathy of unknown significance) 12/27/2017   IgA   Osteoporosis    Peripheral neuropathy 12/27/2017   Pneumonia    Restless leg syndrome    Stroke (HCC) 03/2021   mini strokes   TMJ click    Varicose veins     Past Surgical History:  Procedure Laterality Date   ABDOMINAL HYSTERECTOMY  1992   TAH,BSO   BLADDER SUSPENSION  2011   CRYOMESH   CARPAL TUNNEL RELEASE Bilateral 1982   CHOLECYSTECTOMY  1992   COLONOSCOPY     EYE SURGERY Bilateral    Cataract removal   IR THORACENTESIS ASP PLEURAL SPACE W/IMG GUIDE  05/28/2021   KYPHOPLASTY     X 3   KYPHOPLASTY N/A 08/02/2021   Procedure: Thoracic Twelve KYPHOPLASTY;  Surgeon: Colon Shove, MD;  Location: [facility] OR;  Service: Neurosurgery;  Laterality: N/A;   LUMBAR LAMINECTOMY WITH COFLEX 1 LEVEL N/A 12/29/2015   Procedure: L3-4 Laminectomy with  Coflex;  Surgeon: Victory Gens, MD;  Location: [facility];  Service: Neurosurgery;  Laterality: N/A;  L3-4 Laminectomy with coflex   OOPHORECTOMY     BSO   REPLACEMENT TOTAL KNEE Bilateral 2008,  2011   RIGHT 2008, LEFT 2011   REVERSE SHOULDER ARTHROPLASTY Right 01/30/2020   Procedure: REVERSE SHOULDER ARTHROPLASTY;  Surgeon: Sharl Selinda Dover, MD;  Location: [facility] ORS;  Service: Orthopedics;  Laterality: Right;  2.5 hrs   SPINAL FUSION  2011   VERTEBRAL SURGERY     T-8, T-10. T-12  DR CHRISS    Family History  Problem Relation Age of Onset   Heart disease Mother    Heart disease Father    Stroke Father    Hypertension Sister    Diabetes Sister    Ovarian  cancer Sister    Diabetes Sister    Rectal cancer Sister        ? Colon or rectal   Hyperlipidemia Sister    Hypertension Sister    Hyperlipidemia Sister    Hypertension Sister    Hypertension Brother    Heart disease Brother    Heart disease Brother    Hypertension Brother    Heart disease Brother    Heart disease Brother    Heart disease Brother    Heart disease Brother    Breast cancer Niece    Heart disease Son    Hyperlipidemia Son    Hyperlipidemia Daughter    Hypertension Daughter     Social History:  reports that she has never smoked. She has never used smokeless tobacco. She reports that she does not drink alcohol  and does not use drugs.  Allergies: No Known Allergies  Medications: Prior to Admission:  Medications Prior to Admission  Medication Sig Dispense Refill Last Dose/Taking   ALPRAZolam  (XANAX ) 0.5 MG tablet Take 0.25 mg by mouth 2 (two) times daily as needed (restless legs).   Unknown   Ascorbic Acid (VITAMIN C PO) Take 1 tablet by mouth daily.   02/05/2024 Morning   aspirin  EC 81 MG tablet Take 1 tablet (81 mg total) by mouth daily. Swallow whole. 30 tablet 12 02/05/2024 Morning   Calcium  Carbonate-Vitamin D  (CALCIUM -D PO) Take 1 tablet by mouth daily.   02/05/2024 Morning   celecoxib  (CELEBREX ) 200 MG capsule Take 1 tablet by mouth as needed.   Unknown   dexlansoprazole  (DEXILANT ) 60 MG capsule Take 60 mg by mouth every morning.   02/05/2024 Morning   escitalopram  (LEXAPRO ) 10 MG tablet Take 10 mg by mouth in the morning and at bedtime.   02/05/2024 Morning   levothyroxine  (SYNTHROID , LEVOTHROID) 25 MCG tablet Take 25 mcg by mouth daily before breakfast.    02/05/2024 Morning   losartan  (COZAAR ) 100 MG tablet Take 1 tablet (100 mg total) by mouth daily. Take once daily at bedtime (Patient taking differently: Take 50 mg by mouth 2 (two) times daily. Patient could not tolerate 100 mg at once) 90 tablet 1 02/05/2024 Morning   metoprolol  succinate (TOPROL -XL) 50 MG 24 hr  tablet Take 1.5 tablets (75 mg total) by mouth daily. Take with or immediately following a meal. 135 tablet 3 02/04/2024   Multiple Vitamin (MULTIVITAMIN WITH MINERALS) TABS tablet Take 1 tablet by mouth daily.   02/05/2024 Morning   Probiotic Product (ALIGN) 4 MG CAPS 1 capsule by mouth once a day   02/05/2024 Morning   pyridoxine  (B-6) 500 MG tablet Take 1 tablet (500 mg total) by mouth daily. 30 tablet  5 Past Week   rosuvastatin  (CRESTOR ) 20 MG tablet Take 1 tablet (20 mg total) by mouth daily. (Patient taking differently: Take 20 mg by mouth See admin instructions. Takes 3 times per week.) 30 tablet 0 Past Week   spironolactone  (ALDACTONE ) 25 MG tablet TAKE 1 TABLET (25 MG TOTAL) BY MOUTH DAILY. 90 tablet 0 02/05/2024 Morning   Vitamin D , Ergocalciferol , (DRISDOL ) 50000 UNITS CAPS Take 50,000 Units by mouth every Friday.   02/02/2024   VITAMIN E PO Take 1 capsule by mouth daily.   Past Week   zolpidem  (AMBIEN  CR) 12.5 MG CR tablet Take 12.5 mg by mouth at bedtime.   02/04/2024   cefadroxil  (DURICEF) 500 MG capsule Take 1 capsule (500 mg total) by mouth 2 (two) times daily. (Patient not taking: Reported on 02/06/2024) 14 capsule 0 Not Taking   Wheat Dextrin (BENEFIBER) POWD Take 1 daily dose (Patient not taking: Reported on 02/06/2024) 500 g 0 Not Taking    Results for orders placed or performed during the hospital encounter of 02/06/24 (from the past 48 hours)  Basic metabolic panel     Status: Abnormal   Collection Time: 02/06/24  4:43 AM  Result Value Ref Range   Sodium 133 (L) 135 - 145 mmol/L   Potassium 4.1 3.5 - 5.1 mmol/L   Chloride 99 98 - 111 mmol/L   CO2 23 22 - 32 mmol/L   Glucose, Bld 131 (H) 70 - 99 mg/dL    Comment: Glucose reference range applies only to samples taken after fasting for at least 8 hours.   BUN 28 (H) 8 - 23 mg/dL   Creatinine, Ser 9.21 0.44 - 1.00 mg/dL   Calcium  9.8 8.9 - 10.3 mg/dL   GFR, Estimated >39 >39 mL/min    Comment: (NOTE) Calculated using the CKD-EPI  Creatinine Equation (2021)    Anion gap 11 5 - 15    Comment: Performed at [facility], [address] [place], [place] [address]  CBC with Differential     Status: Abnormal   Collection Time: 02/06/24  4:43 AM  Result Value Ref Range   WBC 13.8 (H) 4.0 - 10.5 K/uL   RBC 3.15 (L) 3.87 - 5.11 MIL/uL   Hemoglobin 10.1 (L) 12.0 - 15.0 g/dL   HCT 67.2 (L) 63.9 - 53.9 %   MCV 103.8 (H) 80.0 - 100.0 fL   MCH 32.1 26.0 - 34.0 pg   MCHC 30.9 30.0 - 36.0 g/dL   RDW 86.4 88.4 - 84.4 %   Platelets 331 150 - 400 K/uL   nRBC 0.0 0.0 - 0.2 %   Neutrophils Relative % 81 %   Neutro Abs 11.2 (H) 1.7 - 7.7 K/uL   Lymphocytes Relative 9 %   Lymphs Abs 1.3 0.7 - 4.0 K/uL   Monocytes Relative 9 %   Monocytes Absolute 1.2 (H) 0.1 - 1.0 K/uL   Eosinophils Relative 0 %   Eosinophils Absolute 0.0 0.0 - 0.5 K/uL   Basophils Relative 0 %   Basophils Absolute 0.0 0.0 - 0.1 K/uL   Immature Granulocytes 1 %   Abs Immature Granulocytes 0.09 (H) 0.00 - 0.07 K/uL    Comment: Performed at [facility], [address] [place], [place] [address]  Magnesium     Status: None   Collection Time: 02/06/24  4:43 AM  Result Value Ref Range   Magnesium 2.1 1.7 - 2.4 mg/dL    Comment: Performed at [facility], [address] [place], [place] [address]  Phosphorus     Status: None   Collection Time: 02/06/24  4:43 AM  Result Value Ref Range   Phosphorus 3.3 2.5 - 4.6 mg/dL    Comment: Performed at [facility], [address] [place], [place] [address]  Type and screen [facility]     Status: None   Collection Time: 02/06/24  7:02 AM  Result Value Ref Range   ABO/RH(D) O NEG    Antibody Screen NEG    Sample Expiration      02/09/2024,2359 Performed at [facility], [address] [place], [place] [address]     DG CHEST PORT 1 VIEW Result Date: 02/06/2024 EXAM: 1 VIEW XRAY OF THE CHEST  02/06/2024 06:16:00 AM COMPARISON: 01/19/2024 previous right shoulder arthroplasty. CLINICAL HISTORY: [id number] Closed fracture of right distal femur [facility]) [id number]; [id number] Preoperative examination [id number]. Pre-op,femur fracture FINDINGS: LUNGS AND PLEURA: No pleural fluid, interstitial edema or airspace consolidation. HEART AND MEDIASTINUM: Stable cardiomediastinal contours. BONES AND SOFT TISSUES: Postoperative changes noted within the cervical spine. Loop recorder is noted within the projection of the left lower chest. Atherosclerotic calcifications. Signs of previous kyphoplasty within the thoracic and lumbar spine. Remote healed left posterior seventh, eighth and ninth rib fractures. STOMACH AND BOWEL: Moderate-sized hiatal hernia. IMPRESSION: 1. No acute cardiopulmonary pathology. 2. Moderate-sized hiatal hernia. Electronically signed by: Waddell Calk MD 02/06/2024 06:33 AM EDT RP Workstation: [id number]   CT FEMUR RIGHT WO CONTRAST Result Date: 02/06/2024 EXAM: CT OF THE RIGHT FEMUR, WITHOUT IV CONTRAST 02/06/2024 05:20:00 AM TECHNIQUE: Axial images were acquired through the right femur without IV contrast. Reformatted images were reviewed. Automated exposure control, iterative reconstruction, and/or weight based adjustment of the mA/kV was utilized to reduce the radiation dose to as low as reasonably achievable. COMPARISON: Plain film radiographs of the right femur from 01/03/2023. CLINICAL HISTORY: Periprosthetic fracture, surgical planning. Please include full length of femur. FINDINGS: BONES: Comminuted, periprosthetic fracture of the distal right femur status post right total knee arthroplasty. The fracture fragments appear impacted with 90 degrees lateral rotation of the distal fracture fragments with approximately 45 degree proximal angulation and approximately 4 cm impaction. Impacted fracture fragments extend up to The cement bone interface within the central aspect of the distal femoral metaphysis.  Moderate right hip osteoarthritis. JOINTS: No dislocation. The joint spaces are normal. SOFT TISSUES: Signs of intramuscular edema/hematoma identified within the anterior and lateral aspects of the quadriceps muscle. IMPRESSION: 1. Comminuted, periprosthetic fracture of the distal right femur status post right total knee arthroplasty. 2. Signs of intramuscular edema/hematoma within the anterior and lateral aspects of the quadriceps muscle. 3. Moderate right hip osteoarthritis. Electronically signed by: Waddell Calk MD 02/06/2024 05:59 AM EDT RP Workstation: [id number]   DG Knee 1-2 Views Right Result Date: 02/06/2024 EXAM: 1 or 2 VIEW(S) XRAY OF THE RIGHT KNEE 02/06/2024 03:53:00 AM COMPARISON: None available. CLINICAL HISTORY: [id number] Fall 190176. Patient fell a few hours ago and injured leg, unable to bear weight, obvious deformity. FINDINGS: BONES AND JOINTS: Oblique periprosthetic fracture of the distal right femur. The fracture line extends to the anterior tip of the femoral component of the right total knee arthroplasty. There is apex posterior angulation of the fracture. Moderate knee joint effusion. SOFT TISSUES: The soft tissues are unremarkable. IMPRESSION: 1. Oblique periprosthetic fracture of the distal right femur. Electronically signed by: Norman Gatlin MD 02/06/2024 04:02 AM EDT RP Workstation: [id number]  Intake/Output      09/08 0701 09/09 0700 09/09 0701 09/10 0700   IV Piggyback  500   Total Intake  500   Net  +500           ROS No recent fever, bleeding abnormalities, urologic dysfunction, GI problems, or weight gain.  Blood pressure (!) 122/95, pulse 81, temperature (!) 97.5 F (36.4 C), temperature source Oral, resp. rate 17, SpO2 99%. Physical Exam NCAT RRR CTA Abd soft, nontender RLE Hare splint  Edema/ swelling controlled  Sens: DPN, SPN, TN intact  Motor: EHL, FHL, and lessor toe ext and flex all intact grossly  Brisk cap refill, warm to touch      Gait:  could not assess Coordination and balance: could not assess   Assessment/Plan:  Right supracondylar femur  The risks and benefits of right femur repair were discussed with the patient, including the possibility of infection, nerve injury, vessel injury, wound breakdown, arthritis, symptomatic hardware, DVT/ PE, loss of motion, malunion, nonunion, and need for further surgery among others. These risks were acknowledged and consent was provided to proceed.    Ozell Bruch, MD [facility], [facility] [phone]  02/06/2024, 2:06 PM  [facility] [address] [place] [address] [phone] [phone] (F)    After 5pm and on the weekends please log on to Amion, go to orthopaedics and the look under the [facility] Call for the provider(s) on call. You can also call our office at [phone] and then follow the prompts to be connected to the call team.

## 2024-02-06 NOTE — ED Notes (Signed)
 Traction splint applied. Ortho tech advised that pt must remain in current bed with splint on until surgery. Tech advised that traction should not be messed with and to ensure weight was not moving too much when transporting to not alter traction amount.

## 2024-02-06 NOTE — ED Notes (Signed)
 Report given to Rosaline, RN [facility]

## 2024-02-06 NOTE — ED Notes (Signed)
 Pt transferred to hospital bed. Ortho tech at bedside.

## 2024-02-06 NOTE — ED Notes (Signed)
 Ortho tech called regarding bucks traction. Ortho advised to call back after pt completed CT scan.

## 2024-02-06 NOTE — Progress Notes (Signed)
 Orthopedic Tech Progress Note Patient Details:  [name] 17-Dec-1933 [id number]  Musculoskeletal Traction Type of Traction: Bucks Skin Traction Traction Location: rle Traction Weight: 5 lbs   Post Interventions Patient Tolerated: Well Instructions Provided: Care of device, Adjustment of device  [name] PARAS 02/06/2024, 6:34 AM

## 2024-02-06 NOTE — H&P (Signed)
 History and Physical    Patient: [name] FMW:[id number] DOB: December 11, 1933 DOA: 02/06/2024 DOS: the patient was seen and examined on 02/06/2024 PCP: Shepard Ade, MD  Patient coming from: Home  Chief Complaint:  Chief Complaint  Patient presents with   Knee Pain   Fall   HPI: [name] is a 89 y.o. female with medical history significant of anxiety, osteoarthritis, colon polyps, CAD, dilated cardiomyopathy, chronic systolic CHF, GERD, hiatal hernia, hyperlipidemia, hypertension, hypothyroidism, and MGUS, osteoporosis, peripheral neuropathy, history of pneumonia, restless leg syndrome, history of nonhemorrhagic CVA, history of TIAs, TMJ click, varicose veins, depression, detrusor instability,  vertebral fracture x 3 who had a mechanical fall at home in her bathroom injuring her right knee resulting in the area having deformity, pain and inability to bear weight on her right knee.  She denied fever, chills, rhinorrhea, sore throat, wheezing or hemoptysis.  No chest pain, palpitations, diaphoresis, PND, orthopnea or pitting edema of the lower extremities.  No abdominal pain, nausea, emesis, diarrhea, constipation, melena or hematochezia.  No flank pain, dysuria, frequency or hematuria.  No polyuria, polydipsia, polyphagia or blurred vision.   ED course: Initial vital signs were temperature 97.8 F, pulse 82, respirations 16, BP 160/92 mmHg O2 sat 96% on room air.  The patient received ketorolac  15 mg IVP, morphine  4 mg IVP, ondansetron  4 mg IVP and 5000 mL of normal saline bolus.  Lab work: CBC showed a white count of 13.8, hemoglobin 10.1 g/dL and platelets 668.  BMP showed sodium 133 mmol/L, glucose 131 and BUN 28 mg/dL.  The rest of the electrolytes and creatinine was normal.  Unremarkable phosphorus and magnesium level.  Imaging: Right knee x-ray show an oblique periprosthetic fracture of the distal right femur.  CT of the right femur show a comminuted, periprosthetic fracture of the  distal right femur.  There was signs of intramuscular edema/hematoma within the anterior and lateral aspect of the quadriceps muscle.  Moderate right hip osteoarthritis.  Portable 1 view chest radiograph with no acute cardiopulmonary pathology.  There was a moderate size hiatal hernia.   Review of Systems: As mentioned in the history of present illness. All other systems reviewed and are negative. Past Medical History:  Diagnosis Date   Anxiety    Arthritis    Chronic headaches    hx - now just occasional HA per patient   Colon polyps    Complication of anesthesia    Elevated blood pressure after having last Kyphoplasty; pt stated I was given Morphine  and had to stay overnight   Coronary artery disease    Depression    DI (detrusor instability)    Dilated cardiomyopathy (HCC) 10/04/2022   TTE 10/03/22: EF 42, mild MR, ascending aorta 38 mm   Fracture of vertebra    x 3   GERD (gastroesophageal reflux disease)    Heart failure with mildly reduced ejection fraction (HFmrEF) (HCC) 01/18/2023   - CCTA 10/07/2006: CAC score 1; LAD < 30% soft plaque   - Myoview 03/30/2015: EF 83, no ischemia; low risk  - TTE 04/21/2021: EF 60-65, no RWMA, Gr 1 DD, normal RVSF, trivial MR, trivial AI - TTE 10/03/22: EF 42, global HK, normal RVSF, mild MR, ascending aorta dilation (38 mm), RAP 3    History of blood transfusion 2011   w/ knee replacement surgery   History of hiatal hernia    small - does not cause any problems per patient   HLD (hyperlipidemia)  Hypertension    Hypothyroidism    Menopausal symptoms    MGUS (monoclonal gammopathy of unknown significance) 12/27/2017   IgA   Osteoporosis    Peripheral neuropathy 12/27/2017   Pneumonia    Restless leg syndrome    Stroke (HCC) 03/2021   mini strokes   TMJ click    Varicose veins    Past Surgical History:  Procedure Laterality Date   ABDOMINAL HYSTERECTOMY  1992   TAH,BSO   BLADDER SUSPENSION  2011   CRYOMESH   CARPAL TUNNEL  RELEASE Bilateral 1982   CHOLECYSTECTOMY  1992   COLONOSCOPY     EYE SURGERY Bilateral    Cataract removal   IR THORACENTESIS ASP PLEURAL SPACE W/IMG GUIDE  05/28/2021   KYPHOPLASTY     X 3   KYPHOPLASTY N/A 08/02/2021   Procedure: Thoracic Twelve KYPHOPLASTY;  Surgeon: Colon Shove, MD;  Location: [facility];  Service: Neurosurgery;  Laterality: N/A;   LUMBAR LAMINECTOMY WITH COFLEX 1 LEVEL N/A 12/29/2015   Procedure: L3-4 Laminectomy with Coflex;  Surgeon: Shove Colon, MD;  Location: [facility];  Service: Neurosurgery;  Laterality: N/A;  L3-4 Laminectomy with coflex   OOPHORECTOMY     BSO   REPLACEMENT TOTAL KNEE Bilateral 2008,  2011   RIGHT 2008, LEFT 2011   REVERSE SHOULDER ARTHROPLASTY Right 01/30/2020   Procedure: REVERSE SHOULDER ARTHROPLASTY;  Surgeon: Sharl Selinda Dover, MD;  Location: [facility] ORS;  Service: Orthopedics;  Laterality: Right;  2.5 hrs   SPINAL FUSION  2011   VERTEBRAL SURGERY     T-8, T-10. T-12  DR CHRISS   Social History:  reports that she has never smoked. She has never used smokeless tobacco. She reports that she does not drink alcohol  and does not use drugs.  No Known Allergies  Family History  Problem Relation Age of Onset   Heart disease Mother    Heart disease Father    Stroke Father    Hypertension Sister    Diabetes Sister    Ovarian cancer Sister    Diabetes Sister    Rectal cancer Sister        ? Colon or rectal   Hyperlipidemia Sister    Hypertension Sister    Hyperlipidemia Sister    Hypertension Sister    Hypertension Brother    Heart disease Brother    Heart disease Brother    Hypertension Brother    Heart disease Brother    Heart disease Brother    Heart disease Brother    Heart disease Brother    Breast cancer Niece    Heart disease Son    Hyperlipidemia Son    Hyperlipidemia Daughter    Hypertension Daughter     Prior to Admission medications   Medication Sig Start Date End Date Taking? Authorizing Provider  ALPRAZolam   (XANAX ) 0.5 MG tablet Take 0.25 mg by mouth 2 (two) times daily as needed (restless legs).   Yes [provider]  Ascorbic Acid (VITAMIN C PO) Take 1 tablet by mouth daily.   Yes [provider]  aspirin  EC 81 MG tablet Take 1 tablet (81 mg total) by mouth daily. Swallow whole. 02/20/23  Yes Weaver, Scott T, PA-C  Calcium  Carbonate-Vitamin D  (CALCIUM -D PO) Take 1 tablet by mouth daily.   Yes [provider]  celecoxib  (CELEBREX ) 200 MG capsule Take 1 tablet by mouth as needed. 09/28/20  Yes [provider]  dexlansoprazole  (DEXILANT ) 60 MG capsule Take 60 mg by mouth every  morning.   Yes [provider]  escitalopram  (LEXAPRO ) 10 MG tablet Take 10 mg by mouth in the morning and at bedtime.   Yes [provider]  levothyroxine  (SYNTHROID , LEVOTHROID) 25 MCG tablet Take 25 mcg by mouth daily before breakfast.    Yes [provider]  losartan  (COZAAR ) 100 MG tablet Take 1 tablet (100 mg total) by mouth daily. Take once daily at bedtime Patient taking differently: Take 50 mg by mouth 2 (two) times daily. Patient could not tolerate 100 mg at once 10/04/23  Yes Thukkani, Arun K, MD  metoprolol  succinate (TOPROL -XL) 50 MG 24 hr tablet Take 1.5 tablets (75 mg total) by mouth daily. Take with or immediately following a meal. 12/28/22  Yes Cindie Ole DASEN, MD  Multiple Vitamin (MULTIVITAMIN WITH MINERALS) TABS tablet Take 1 tablet by mouth daily.   Yes [provider]  Probiotic Product (ALIGN) 4 MG CAPS 1 capsule by mouth once a day   Yes [provider]  pyridoxine  (B-6) 500 MG tablet Take 1 tablet (500 mg total) by mouth daily. 02/24/22  Yes Timmy Maude SAUNDERS, MD  rosuvastatin  (CRESTOR ) 20 MG tablet Take 1 tablet (20 mg total) by mouth daily. Patient taking differently: Take 20 mg by mouth See admin instructions. Takes 3 times per week. 04/22/21  Yes Jerri Keys, MD  spironolactone  (ALDACTONE ) 25 MG tablet TAKE 1 TABLET (25 MG TOTAL)  BY MOUTH DAILY. 12/11/23  Yes Thukkani, Arun K, MD  Vitamin D , Ergocalciferol , (DRISDOL ) 50000 UNITS CAPS Take 50,000 Units by mouth every Friday.   Yes [provider]  VITAMIN E PO Take 1 capsule by mouth daily.   Yes [provider]  zolpidem  (AMBIEN  CR) 12.5 MG CR tablet Take 12.5 mg by mouth at bedtime.   Yes [provider]  cefadroxil  (DURICEF) 500 MG capsule Take 1 capsule (500 mg total) by mouth 2 (two) times daily. Patient not taking: Reported on 02/06/2024 01/19/24   Theotis Cameron HERO, PA-C  Wheat Dextrin [facility]) POWD Take 1 daily dose Patient not taking: Reported on 02/06/2024 02/09/21   Eda Iha, MD    Physical Exam: Vitals:   02/06/24 0223 02/06/24 0300 02/06/24 0636 02/06/24 0720  BP: (!) 160/92 137/83 (!) 95/47 (!) 83/55  Pulse: 82 71 81 (!) 28  Resp: 16 15 13 11   Temp: 97.8 F (36.6 C)  97.7 F (36.5 C)   TempSrc:   Oral   SpO2: 96% 96% 92% 93%   Physical Exam Vitals and nursing note reviewed.  Constitutional:      General: She is awake. She is not in acute distress.    Appearance: She is ill-appearing.  HENT:     Head: Normocephalic.     Nose: No rhinorrhea.     Mouth/Throat:     Mouth: Mucous membranes are moist.  Eyes:     General: No scleral icterus.    Pupils: Pupils are equal, round, and reactive to light.  Neck:     Vascular: No JVD.  Cardiovascular:     Rate and Rhythm: Normal rate and regular rhythm.     Heart sounds: S1 normal and S2 normal.  Pulmonary:     Effort: Pulmonary effort is normal.     Breath sounds: Normal breath sounds. No wheezing, rhonchi or rales.  Abdominal:     General: Bowel sounds are normal. There is no distension.     Palpations: Abdomen is soft.     Tenderness: There is no abdominal  tenderness. There is no right CVA tenderness or left CVA tenderness.  Musculoskeletal:     Cervical back: Neck supple.     Right lower leg: No edema.     Left lower leg: No edema.  Skin:    General: Skin  is warm and dry.  Neurological:     General: No focal deficit present.     Mental Status: She is alert and oriented to person, place, and time.  Psychiatric:        Mood and Affect: Mood normal.        Behavior: Behavior normal. Behavior is cooperative.     Data Reviewed:  Results are pending, will review when available. EKG: Vent. rate 81 BPM PR interval 176 ms QRS duration 80 ms QT/QTcB 400/465 ms P-R-T axes 49 -20 48 Sinus rhythm Left ventricular hypertrophy Anterior Q waves, possibly due to LVH  Assessment and Plan: Principal Problem:   Closed fracture of right distal femur (HCC) Admit to telemetry/inpatient. Ice area as needed. Buck's traction per protocol. Analgesics as needed. Antiemetics as needed. Consult TOC team. Consult nutritional services. PT evaluation after surgery. Orthopedic surgery evaluation appreciated.  Active Problems:   Osteoporosis On calcium  and vitamin D . Would benefit from further treatment. Follow-up with primary care provider.    CAD (coronary artery disease) Continue ASA, beta-blocker and statin.    Hyperlipidemia Continue rosuvastatin  20 mg p.o. daily.    Essential hypertension   Heart failure with mildly reduced ejection fraction (HFmrEF) (HCC) Continue metoprolol  succinate 50 mg p.o. daily. Hold ARB and spironolactone .    Macrocytic anemia Monitor hematocrit and hemoglobin. She usually has B12 levels over normal.    Hyponatremia Minimal. Unknown significance. Will follow level in AM.    Anxiety disorder Continue Lexapro  10 mg p.o. twice daily.    Insomnia Continue Ambien  at a reduced dose over insomnia.     Advance Care Planning:   Code Status: Full Code   Consults: Orthopedic surgery Tatiana Bruch, MD)  Family Communication:   Severity of Illness: The appropriate patient status for this patient is INPATIENT. Inpatient status is judged to be reasonable and necessary in order to provide the required  intensity of service to ensure the patient's safety. The patient's presenting symptoms, physical exam findings, and initial radiographic and laboratory data in the context of their chronic comorbidities is felt to place them at high risk for further clinical deterioration. Furthermore, it is not anticipated that the patient will be medically stable for discharge from the hospital within 2 midnights of admission.   * I certify that at the point of admission it is my clinical judgment that the patient will require inpatient hospital care spanning beyond 2 midnights from the point of admission due to high intensity of service, high risk for further deterioration and high frequency of surveillance required.*  Author: Alm Dorn Castor, MD 02/06/2024 7:50 AM  For on call review [email or web address].   This document was prepared using Dragon voice recognition software and may contain some unintended transcription errors.

## 2024-02-06 NOTE — ED Provider Notes (Signed)
 [facility] AT [facility] Provider Note   CSN: [id number] Arrival date & time: 02/06/24  9780     Patient presents with: Knee Pain and Fall   [name] is a 89 y.o. female.  Patient with past medical history significant for bilateral knee replacements, osteoporosis, GERD, peripheral neuropathy, CAD, HTN presents to the emergency room at secondary to a knee injury.  The patient states that she was in her bathroom when her knee got twisted in a toilet paper holder and.  She states that she was able to lower herself down to sit on the toilet but had a severe pain in the right knee.  She denies hitting her head or other injuries during the incident.  She denies any dizziness or syncope.  The patient does not take blood thinners.    Knee Pain Fall       Prior to Admission medications   Medication Sig Start Date End Date Taking? Authorizing Provider  ALPRAZolam  (XANAX ) 0.5 MG tablet Take 0.25 mg by mouth 2 (two) times daily as needed (restless legs).   Yes [provider]  Ascorbic Acid (VITAMIN C PO) Take 1 tablet by mouth daily.   Yes [provider]  aspirin  EC 81 MG tablet Take 1 tablet (81 mg total) by mouth daily. Swallow whole. 02/20/23  Yes Weaver, Scott T, PA-C  Calcium  Carbonate-Vitamin D  (CALCIUM -D PO) Take 1 tablet by mouth daily.   Yes [provider]  celecoxib  (CELEBREX ) 200 MG capsule Take 1 tablet by mouth as needed. 09/28/20  Yes [provider]  dexlansoprazole  (DEXILANT ) 60 MG capsule Take 60 mg by mouth every morning.   Yes [provider]  escitalopram  (LEXAPRO ) 10 MG tablet Take 10 mg by mouth in the morning and at bedtime.   Yes [provider]  levothyroxine  (SYNTHROID , LEVOTHROID) 25 MCG tablet Take 25 mcg by mouth daily before breakfast.    Yes [provider]  losartan  (COZAAR ) 100 MG tablet Take 1 tablet (100 mg total) by mouth daily. Take once daily at  bedtime Patient taking differently: Take 100 mg by mouth 2 (two) times daily. 10/04/23  Yes Thukkani, Arun K, MD  metoprolol  succinate (TOPROL -XL) 50 MG 24 hr tablet Take 1.5 tablets (75 mg total) by mouth daily. Take with or immediately following a meal. 12/28/22  Yes Cindie Ole DASEN, MD  Multiple Vitamin (MULTIVITAMIN WITH MINERALS) TABS tablet Take 1 tablet by mouth daily.   Yes [provider]  Probiotic Product (ALIGN) 4 MG CAPS 1 capsule by mouth once a day   Yes [provider]  pyridoxine  (B-6) 500 MG tablet Take 1 tablet (500 mg total) by mouth daily. 02/24/22  Yes Ennever, Maude SAUNDERS, MD  rosuvastatin  (CRESTOR ) 20 MG tablet Take 1 tablet (20 mg total) by mouth daily. Patient taking differently: Take 20 mg by mouth. 05/18/2023 Takes 3 times per week. 04/22/21  Yes Jerri Keys, MD  spironolactone  (ALDACTONE ) 25 MG tablet TAKE 1 TABLET (25 MG TOTAL) BY MOUTH DAILY. 12/11/23  Yes Thukkani, Arun K, MD  Vitamin D , Ergocalciferol , (DRISDOL ) 50000 UNITS CAPS Take 50,000 Units by mouth every Friday.   Yes [provider]  VITAMIN E PO Take 1 capsule by mouth daily.   Yes [provider]  zolpidem  (AMBIEN  CR) 12.5 MG CR tablet Take 12.5 mg by mouth at bedtime.   Yes [provider]  cefadroxil  (DURICEF) 500 MG capsule Take 1 capsule (500 mg total) by  mouth 2 (two) times daily. 01/19/24   Theotis Peers M, PA-C  Wheat Dextrin (BENEFIBER) POWD Take 1 daily dose 02/09/21   Eda Iha, MD    Allergies: Patient has no known allergies.    Review of Systems  Updated Vital Signs BP 137/83   Pulse 71   Temp 97.8 F (36.6 C)   Resp 15   SpO2 96%   Physical Exam Vitals and nursing note reviewed.  Constitutional:      General: She is not in acute distress.    Appearance: She is well-developed.  HENT:     Head: Normocephalic and atraumatic.  Eyes:     Conjunctiva/sclera: Conjunctivae normal.  Cardiovascular:     Rate and Rhythm: Normal rate and  regular rhythm.     Heart sounds: No murmur heard. Pulmonary:     Effort: Pulmonary effort is normal. No respiratory distress.     Breath sounds: Normal breath sounds.  Abdominal:     Palpations: Abdomen is soft.     Tenderness: There is no abdominal tenderness.  Musculoskeletal:        General: No swelling.     Cervical back: Neck supple.     Comments: Patient with apparent deformity near distal right femur.  Pain with any range of motion of the right knee.  Patient is able to move ankle, pedal pulse palpable  Skin:    General: Skin is warm and dry.     Capillary Refill: Capillary refill takes less than 2 seconds.  Neurological:     Mental Status: She is alert.  Psychiatric:        Mood and Affect: Mood normal.     (all labs ordered are listed, but only abnormal results are displayed) Labs Reviewed  BASIC METABOLIC PANEL WITH GFR - Abnormal; Notable for the following components:      Result Value   Sodium 133 (*)    Glucose, Bld 131 (*)    BUN 28 (*)    All other components within normal limits  CBC WITH DIFFERENTIAL/PLATELET - Abnormal; Notable for the following components:   WBC 13.8 (*)    RBC 3.15 (*)    Hemoglobin 10.1 (*)    HCT 32.7 (*)    MCV 103.8 (*)    Neutro Abs 11.2 (*)    Monocytes Absolute 1.2 (*)    Abs Immature Granulocytes 0.09 (*)    All other components within normal limits    EKG: None  Radiology: DG Knee 1-2 Views Right Result Date: 02/06/2024 EXAM: 1 or 2 VIEW(S) XRAY OF THE RIGHT KNEE 02/06/2024 03:53:00 AM COMPARISON: None available. CLINICAL HISTORY: 809823 Fall [id number]. Patient fell a few hours ago and injured leg, unable to bear weight, obvious deformity. FINDINGS: BONES AND JOINTS: Oblique periprosthetic fracture of the distal right femur. The fracture line extends to the anterior tip of the femoral component of the right total knee arthroplasty. There is apex posterior angulation of the fracture. Moderate knee joint effusion. SOFT TISSUES:  The soft tissues are unremarkable. IMPRESSION: 1. Oblique periprosthetic fracture of the distal right femur. Electronically signed by: Norman Gatlin MD 02/06/2024 04:02 AM EDT RP Workstation: [id number]     Procedures   Medications Ordered in the ED  ketorolac  (TORADOL ) 15 MG/ML injection 15 mg (15 mg Intramuscular Given 02/06/24 0349)  morphine  (PF) 4 MG/ML injection 4 mg (4 mg Intravenous Given 02/06/24 0537)  ondansetron  (ZOFRAN ) injection 4 mg (4 mg Intravenous Given 02/06/24 0537)  Medical Decision Making Amount and/or Complexity of Data Reviewed Labs: ordered. Radiology: ordered.  Risk Prescription drug management. Decision regarding hospitalization.   This patient presents to the ED for concern of knee pain, this involves an extensive number of treatment options, and is a complaint that carries with it a high risk of complications and morbidity.  The differential diagnosis includes fracture, dislocation, soft tissue injury, others   Co morbidities / Chronic conditions that complicate the patient evaluation  History of bilateral knee replacements   Additional history obtained:  Additional history obtained from EMR  Lab Tests:  I Ordered, and personally interpreted labs.  The pertinent results include: White count of 13,800, hemoglobin 10.1 down from 12.8 2 weeks ago, grossly unremarkable BMP   Imaging Studies ordered:  I ordered imaging studies including plain films of the right knee, CT of the right femur without contrast I independently visualized and interpreted imaging which showed Oblique periprosthetic fracture of the distal right femur. CT results pending I agree with the radiologist interpretation    Problem List / ED Course / Critical interventions / Medication management   I ordered medication including Toradol , morphine , Zofran  Reevaluation of the patient after these medicines showed that the patient  improved   Consultations Obtained:  I requested consultation with the orthopedic surgeon, Dr. Burnetta,  and discussed lab and imaging findings as well as pertinent plan - they recommend: CT scan of the femur, Buck's traction, n.p.o., will see in formal consult this a.m.  I requested consultation with the hospitalist, Dr.Opyd, who agrees to see patient for admission   Test / Admission - Considered:  Patient with periprosthetic femur fracture which will require surgical fixation.  Orthopedic surgery plans to see the patient for formal planning.  Patient will need hospitalist admission for medical management.      Final diagnoses:  Periprosthetic fracture of proximal end of femur    ED Discharge Orders     None          Logan Ubaldo KATHEE DEVONNA 02/06/24 [id number]    Trine Raynell Moder, MD 02/06/24 [phone]

## 2024-02-06 NOTE — Op Note (Incomplete)
 02/06/2024  2:39 PM  PATIENT:  [name]  89 y.o. female  PRE-OPERATIVE DIAGNOSIS:  RIGHT PERIPROSTHETIC SUPRACONDYLAR FEMUR FRACTURE  POST-OPERATIVE DIAGNOSIS:  RIGHT PERIPROSTHETIC SUPRACONDYLAR FEMUR FRACTURE  PROCEDURE:  OPEN REDUCTION INTERNAL FIXATION OF RIGHT PERIPROSTHETIC FEMUR FRACTURE WITH SMITH NEPHEW PLATE  SURGEON:  OZELL BRUCH, MD  PHYSICIAN ASSISTANT: Francis Mt, PA-C  ANESTHESIA:   GENERAL  I/O:  Total I/O In: 500 [IV Piggyback:500] Out: -   SPECIMEN:  No Specimen  TOURNIQUET:  NONE  COMPLICATIONS: NONE  DICTATION: Note written in EPIC  DISPOSITION: TO PACU  CONDITION: STABLE  DELAY START OF DVT PROPHYLAXIS BECAUSE OF BLEEDING RISK: NO  BRIEF SUMMARY OF INDICATION FOR PROCEDURE:  [name] is a 89 y.o. who sustained a comminuted supracondylar femur fracture, which was quite distal and near to the femoral implant, in ground level fall. The risks and benefits of surgery were discussed with the patient and family, including the possibility of infection, nerve injury, vessel injury, wound breakdown, arthritis, symptomatic hardware, DVT/ PE, loss of motion, malunion, nonunion, heart attack, stroke, death, implant loosening, and need for further surgery among others.  These risks were acknowledged and consent provided to proceed.   BRIEF SUMMARY OF PROCEDURE:  The patient was taken to the operating room where general anesthesia was induced and after receipt of preoperative antibiotics.  The right lower extremity was prepped and draped in usual sterile fashion.  No tourniquet was used during the procedure.  A radiolucent triangle was placed underneath the femur and towel bumps to restore appropriate alignment and length. C-arm was brought in to confirm the appropriate position of the distal incision.  This was checked on lateral as well.  An incision was then made.  Dissection was carried down to the IT band.  It was split in line with the incision.   The deep retractor was placed.  Hematoma evacuated. My assistant pulled and maintained traction and dialed in the rotation for alignment.  I then introduced the plate.  I placed a pin distally parallel to the femoral component and joint line of the tibial tray and then checked the position on both AP and lateral views proximally, placing a single screw distally and a drill bit through the most proximal hole.  To create a bridge construct. I then placed multiple locked screws in the articular block, checking their trajectory and alignment with fluoro, followed by additional standard screws proximally. Wounds were irrigated thoroughly and then closed in standard layered fashion using #1 Vicryl for the tensor, 0 Vicryl for the deep subcu, 2-0 Vicryl and 3-0 vertical mattress sutures for the skin.  A sterile gently compressive dressing was then applied with an Ace wrap from foot to thigh as well as a knee immobilizer until the patient wakes up adequately from anesthesia at which time she will be allowed unrestricted range of motion. Francis Mt, PA-C, assisted me throughout as did a PA student and an assistant was necessary to obtain and maintain reduction during provisional and definitive fixation and also assisted with wound closure.   PROGNOSIS:  Because of comorbidities, patient is at increased risk for perioperative complications. PT/ OT to assist with touch down weightbearing and unrestricted range of motion of the knee without bracing.  Formal pharmacologic DVT prophylaxis with Lovenox . F/u in 10-14 days for removal of sutures. Patient does live independently at home and already uses a rollator to assist with mobility. (Son and daughter live across the street.)     OZELL  HILARIO Bruch, M.D.

## 2024-02-06 NOTE — ED Notes (Signed)
 Admitting MD messaged via secure chat to notify of down trending BP and to request IVF bolus.

## 2024-02-06 NOTE — Anesthesia Procedure Notes (Signed)
 Procedure Name: Intubation Date/Time: 02/06/2024 2:42 PM  Performed by: Marva Lonni PARAS, CRNAPre-anesthesia Checklist: Patient identified, Emergency Drugs available, Suction available and Patient being monitored Patient Re-evaluated:Patient Re-evaluated prior to induction Oxygen Delivery Method: Circle System Utilized Preoxygenation: Pre-oxygenation with 100% oxygen Induction Type: IV induction Ventilation: Mask ventilation without difficulty Laryngoscope Size: Mac and 3 Grade View: Grade I Tube type: Oral Tube size: 7.0 mm Number of attempts: 1 Airway Equipment and Method: Stylet and Oral airway Placement Confirmation: ETT inserted through vocal cords under direct vision, positive ETCO2 and breath sounds checked- equal and bilateral Secured at: 21 cm Tube secured with: Tape Dental Injury: Teeth and Oropharynx as per pre-operative assessment

## 2024-02-06 NOTE — ED Notes (Signed)
 Patient transported to CT

## 2024-02-06 NOTE — ED Notes (Signed)
 X-ray at bedside.

## 2024-02-07 ENCOUNTER — Other Ambulatory Visit: Payer: Self-pay

## 2024-02-07 DIAGNOSIS — S72401A Unspecified fracture of lower end of right femur, initial encounter for closed fracture: Secondary | ICD-10-CM | POA: Diagnosis not present

## 2024-02-07 LAB — COMPREHENSIVE METABOLIC PANEL WITH GFR
ALT: 150 U/L — ABNORMAL HIGH (ref 0–44)
AST: 137 U/L — ABNORMAL HIGH (ref 15–41)
Albumin: 3 g/dL — ABNORMAL LOW (ref 3.5–5.0)
Alkaline Phosphatase: 56 U/L (ref 38–126)
Anion gap: 9 (ref 5–15)
BUN: 29 mg/dL — ABNORMAL HIGH (ref 8–23)
CO2: 24 mmol/L (ref 22–32)
Calcium: 8.7 mg/dL — ABNORMAL LOW (ref 8.9–10.3)
Chloride: 98 mmol/L (ref 98–111)
Creatinine, Ser: 0.88 mg/dL (ref 0.44–1.00)
GFR, Estimated: >60 mL/min (ref >60)
Glucose, Bld: 145 mg/dL — ABNORMAL HIGH (ref 70–99)
Potassium: 4.2 mmol/L (ref 3.5–5.1)
Sodium: 131 mmol/L — ABNORMAL LOW (ref 135–145)
Total Bilirubin: 0.9 mg/dL (ref 0.0–1.2)
Total Protein: 5 g/dL — ABNORMAL LOW (ref 6.5–8.1)

## 2024-02-07 LAB — CBC
HCT: 24.6 % — ABNORMAL LOW (ref 36.0–46.0)
Hemoglobin: 7.9 g/dL — ABNORMAL LOW (ref 12.0–15.0)
MCH: 33.1 pg (ref 26.0–34.0)
MCHC: 32.1 g/dL (ref 30.0–36.0)
MCV: 102.9 fL — ABNORMAL HIGH (ref 80.0–100.0)
Platelets: 263 K/uL (ref 150–400)
RBC: 2.39 MIL/uL — ABNORMAL LOW (ref 3.87–5.11)
RDW: 13.8 % (ref 11.5–15.5)
WBC: 7.9 K/uL (ref 4.0–10.5)
nRBC: 0 % (ref 0.0–0.2)

## 2024-02-07 LAB — VITAMIN D 25 HYDROXY (VIT D DEFICIENCY, FRACTURES): Vit D, 25-Hydroxy: 119.72 ng/mL — ABNORMAL HIGH (ref 30–100)

## 2024-02-07 LAB — HEMOGLOBIN AND HEMATOCRIT, BLOOD
HCT: 24.6 % — ABNORMAL LOW (ref 36.0–46.0)
Hemoglobin: 7.9 g/dL — ABNORMAL LOW (ref 12.0–15.0)

## 2024-02-07 MED ORDER — ORAL CARE MOUTH RINSE: 15.0000 mL | OROMUCOSAL | Status: DC | PRN

## 2024-02-07 MED ORDER — DIPHENHYDRAMINE HCL 25 MG PO CAPS
25.0000 mg | ORAL_CAPSULE | Freq: Once | ORAL | Status: AC | PRN
  Administered 2024-02-07: 25 mg via ORAL
  Filled 2024-02-07: qty 1

## 2024-02-07 MED ORDER — DIPHENHYDRAMINE HCL 25 MG PO CAPS
25.0000 mg | ORAL_CAPSULE | Freq: Four times a day (QID) | ORAL | Status: DC | PRN
  Administered 2024-02-07: 25 mg via ORAL
  Filled 2024-02-07: qty 1

## 2024-02-07 NOTE — Evaluation (Signed)
 Occupational Therapy Evaluation Patient Details Name: [name] MRN: [id number] DOB: [birth year] Today's Date: 02/07/2024   History of Present Illness   [name] is an 89 y.o. female admitted 02/06/24 following mechanical fall at home in her bathroom sustaining a comminuted right distal femur supracondylar fracture. Pt s/p ORIF 9/9. PMHx: anxiety, OA, colon polyps, CAD, dilated cardiomyopathy, chronic systolic CHF, GERD, hiatal hernia, HLD, HTN, hypothyroidism, MGUS, osteoporosis, peripheral neuropathy, restless leg syndrome, nonhemorrhagic CVA, TIAs, detrusor instability, and vertebral fracture.     Clinical Impressions At baseline, pt with Mod I with ADLs and functional mobility with a Rollator or SPC and receives assistance from family for IADLs. Pt also reports a hx of frequent falls, including 6 falls in August. Pt now presents with decreased activity tolerance, pain affecting functional level, generalized B UE weakness, decreased balance, fear of falling affecting functional level, impaired cardiopulmonary status, and decreased safety and independence with functional tasks. Pt currently demonstrates ability to largely complete ADLs with Set up to Total assist +2, bed mobility with Contact guard to Min assist, and functional STS transfers with a RW with Mod assist +2 and stand-pivot transfers with a RW with Max-Total assist +2. Pt participated well in session and is motivated to return to PLOF. Pt on 2L continuous O2 through nasal cannula upon arrival. Attempted to wean pt off O2 during session; however, pt frequently holding breath during activity due to pain or fear of falling causing desat into the 80s. O2 sat quickly recovering with PLB but with pt again desating when again holding breathing. Pt O2 sat consistently >/90% on 2L continuous O2 through nasal cannula with pt left on 2L at end of session. Pt will benefit from acute skilled OT services to address deficits and increase safety and  independence with functional tasks. Post acute discharge, pt will benefit from intensive inpatient skilled rehab services < 3 hours per day to maximize rehab potential.      If plan is discharge home, recommend the following:   Two people to help with walking and/or transfers;Two people to help with bathing/dressing/bathroom;Assistance with cooking/housework;Assist for transportation;Help with stairs or ramp for entrance     Functional Status Assessment   Patient has had a recent decline in their functional status and demonstrates the ability to make significant improvements in function in a reasonable and predictable amount of time.     Equipment Recommendations   BSC/3in1     Recommendations for Other Services         Precautions/Restrictions   Precautions Precautions: Fall Recall of Precautions/Restrictions: Impaired Precaution/Restrictions Comments: Pt fearful of falling Restrictions Weight Bearing Restrictions Per Provider Order: Yes RLE Weight Bearing Per Provider Order: Touchdown weight bearing (for approximately 4 weeks then advance as tolerated. Continue to use [place] and ambulate with assistance.)     Mobility Bed Mobility Overal bed mobility: Needs Assistance Bed Mobility: Supine to Sit     Supine to sit: Contact guard, Min assist, HOB elevated, Used rails     General bed mobility comments: Pt sat up on L side of bed with increased time. Cues for sequencing. Assist to manage RLE and elevate trunk. Scooted fwd til feet flat with use of bed pad.    Transfers Overall transfer level: Needs assistance Equipment used: Rolling [place] (2 wheels) Transfers: Sit to/from Stand, Bed to chair/wheelchair/BSC Sit to Stand: Mod assist, +2 physical assistance, From elevated surface Stand pivot transfers: Max assist, Total assist, +2 physical assistance, From elevated surface  General transfer comment: Pt stood from raised bed height. Cued proper hand  placement using RW. Powered up with modA x2 and use of bed pad to facilitate full hip ext. Pt quickly fatiguing and c/o fear of falling. Returned to sit EOB. Brought recliner chair close by on her left. Transferred with max-totalA x2. Pt pivoting on left foot with step-by-step cues and therapist facilitation of hips to turn and guide pt to sit. Bed pad utilized to maintain hips fwd and upright posture.      Balance Overall balance assessment: Needs assistance Sitting-balance support: No upper extremity supported, Feet supported Sitting balance-Leahy Scale: Fair   Postural control: Left lateral lean Standing balance support: Bilateral upper extremity supported, During functional activity, Reliant on assistive device for balance Standing balance-Leahy Scale: Poor                             ADL either performed or assessed with clinical judgement   ADL Overall ADL's : Needs assistance/impaired Eating/Feeding: Set up;Sitting   Grooming: Set up;Sitting   Upper Body Bathing: Minimal assistance;Sitting   Lower Body Bathing: Maximal assistance;+2 for physical assistance;+2 for safety/equipment;Sitting/lateral leans;Sit to/from stand;Cueing for compensatory techniques   Upper Body Dressing : Minimal assistance;Sitting   Lower Body Dressing: Maximal assistance;+2 for physical assistance;+2 for safety/equipment;Cueing for compensatory techniques;Sitting/lateral leans;Sit to/from stand   Toilet Transfer: Maximal assistance;Total assistance;+2 for physical assistance;+2 for safety/equipment;Stand-pivot;BSC/3in1;Rolling [place] (2 wheels) (cues for hand placement/technique) Toilet Transfer Details (indicate cue type and reason): simulated bed to chair Toileting- Clothing Manipulation and Hygiene: Total assistance;+2 for physical assistance;+2 for safety/equipment;Bed level         General ADL Comments: Pt with decreased activity tolerance, fatiguing quickly during tasks. Pt also  limited by pain and by fear of falling.     Vision Baseline Vision/History: 0 No visual deficits (Hx of B cataracts with subsequent sx with pt reprorting no visual issues since surgeries) Ability to See in Adequate Light: 0 Adequate Patient Visual Report: No change from baseline Vision Assessment?: No apparent visual deficits Additional Comments: [facility] for tasks assessed; not formally screened or evaluated     Perception         Praxis         Pertinent Vitals/Pain Pain Assessment Pain Assessment: 0-10 Pain Score: 8  Pain Location: R hip Pain Descriptors / Indicators: Operative site guarding, Grimacing, Discomfort, Aching Pain Intervention(s): Limited activity within patient's tolerance, Monitored during session, Premedicated before session, Repositioned     Extremity/Trunk Assessment Upper Extremity Assessment Upper Extremity Assessment: Right hand dominant;Generalized weakness   Lower Extremity Assessment Lower Extremity Assessment: Defer to PT evaluation RLE Deficits / Details: Pt with limited AROM hip/knee. She guarded against PROM. Grossly 2/5 hip and 3-/5 knee strength. RLE: Unable to fully assess due to pain RLE Sensation: history of peripheral neuropathy RLE Coordination: decreased gross motor   Cervical / Trunk Assessment Cervical / Trunk Assessment: Other exceptions Cervical / Trunk Exceptions: Hx of 3 vertebra fxs   Communication Communication Communication: Impaired Factors Affecting Communication: Hearing impaired   Cognition Arousal: Lethargic, Suspect due to medications, Alert (Initally lethargic but becoming more alert once bed mobility initiated and sitting EOB) Behavior During Therapy: WFL for tasks assessed/performed, Anxious Cognition: No family/caregiver present to determine baseline, Cognition impaired     Awareness: Intellectual awareness intact, Online awareness intact (intermittent online awareness) Memory impairment (select all  impairments):  (initially requiring increased time for declarative memory,  but suspect this was due to decreased level of alertness/medications) Attention impairment (select first level of impairment): Selective attention (internally distracted by pain and fear of falling)   OT - Cognition Comments: Oriented x4 but initially requiring increased time for processing and limited by anxiety/fear of falling. However, pt responding well to reassurance and positive reinforcement from OT and PT.                 Following commands: Intact       Cueing  General Comments   Cueing Techniques: Verbal cues;Gestural cues;Visual cues  Pt greeted on 2L O2 via [place] with SpO2 98%. Weaned to RA with pt intermittently desaturating to 80s, likely d/t holding breath during session. Cued PLB technique with pt quickly recovering to 94% on RA each time but desating again when again holding her breathing during activity. Reapplied 2L during mobility with SpO2 >90% throughout activity.   Exercises     Shoulder Instructions      Home Living Family/patient expects to be discharged to:: Inpatient rehab Living Arrangements: Alone Available Help at Discharge: Family;Available 24 hours/day (daughter lives next door and is retired. Son lives within driving distance and is also retired.) Type of Home: House Home Access: Stairs to enter [employer] of Steps: 10 (at back door; 6 at front door) Entrance Stairs-Rails: Right;Left;Can reach both (at back door) Home Layout: One level     Bathroom Shower/Tub: Producer, television/film/video: Handicapped height     Home Equipment: Grab bars - tub/shower;Shower seat;Grab bars - toilet;Rollator (4 wheels);Cane - single point   Additional Comments: Pt reports her daughter checks in on her daily. Son lives within driving distance and is also retired.      Prior Functioning/Environment Prior Level of Function : Independent/Modified Independent;Needs  assist             Mobility Comments: Uses a Rollator or cane. Pt reports 6 falls in August ADLs Comments: Mod I; uses a weekly medication planner and daughter assists with setting it up; pt receives Meals on Wheels; [place] provides transportation    OT Problem List: Decreased strength;Decreased activity tolerance;Impaired balance (sitting and/or standing);Decreased safety awareness;Decreased knowledge of use of DME or AE;Decreased knowledge of precautions;Cardiopulmonary status limiting activity;Pain   OT Treatment/Interventions: Self-care/ADL training;Therapeutic exercise;Energy conservation;DME and/or AE instruction;Therapeutic activities;Balance training;Patient/family education      OT Goals(Current goals can be found in the care plan section)   Acute Rehab OT Goals Patient Stated Goal: to stop falling, get stronger, and go to short-term rehab before going home OT Goal Formulation: With patient Time For Goal Achievement: 02/21/24 Potential to Achieve Goals: Good ADL Goals Pt Will Perform Lower Body Bathing: with mod assist;sitting/lateral leans;sit to/from stand (with adaptive equipment as needed) Pt Will Perform Lower Body Dressing: with mod assist;sitting/lateral leans;sit to/from stand (with adaptive equipment as needed) Pt Will Transfer to Toilet: with mod assist;ambulating;bedside commode (with least restrictive AD) Pt Will Perform Toileting - Clothing Manipulation and hygiene: with mod assist;sitting/lateral leans;sit to/from stand Pt/caregiver will Perform Home Exercise Program: Increased strength;Both right and left upper extremity;With Supervision;With written HEP provided;With theraputty (increased activity tolerance; AROM progressing to theraband)   OT Frequency:  Min 2X/week    Co-evaluation PT/OT/SLP Co-Evaluation/Treatment: Yes Reason for Co-Treatment: For patient/therapist safety;To address functional/ADL transfers PT goals addressed during session:  Mobility/safety with mobility;Balance;Proper use of DME OT goals addressed during session: ADL's and self-care      AM-PAC OT 6 Clicks Daily Activity  Outcome Measure Help from another person eating meals?: A Little Help from another person taking care of personal grooming?: A Little Help from another person toileting, which includes using toliet, bedpan, or urinal?: Total Help from another person bathing (including washing, rinsing, drying)?: A Lot Help from another person to put on and taking off regular upper body clothing?: A Little Help from another person to put on and taking off regular lower body clothing?: A Lot 6 Click Score: 14   End of Session Equipment Utilized During Treatment: Gait belt;Rolling [place] (2 wheels);Oxygen Nurse Communication: Mobility status;Other (comment) (Recommend use of [place] for transfers with nursing staff)  Activity Tolerance: Patient tolerated treatment well;Patient limited by pain;Other (comment) (Limited by fear of falling) Patient left: in chair;with call bell/phone within reach;with chair alarm set  OT Visit Diagnosis: Other abnormalities of gait and mobility (R26.89);Unsteadiness on feet (R26.81);History of falling (Z91.81);Muscle weakness (generalized) (M62.81);Repeated falls (R29.6);Pain                Time: 8984-8950 OT Time Calculation (min): 34 min Charges:  OT General Charges $OT Visit: 1 Visit OT Evaluation $OT Eval Moderate Complexity: 1 Mod  Margarie Rockey HERO., OTR/L, MA Acute Rehab [phone]   Margarie FORBES Horns 02/07/2024, 3:17 PM

## 2024-02-07 NOTE — Anesthesia Postprocedure Evaluation (Signed)
 Anesthesia Post Note  Patient: [name]  Procedure(s) Performed: OPEN REDUCTION INTERNAL FIXATION (ORIF) DISTAL FEMUR FRACTURE (Right)     Patient location during evaluation: PACU Anesthesia Type: General Level of consciousness: awake and alert Pain management: pain level controlled Vital Signs Assessment: post-procedure vital signs reviewed and stable Respiratory status: spontaneous breathing, nonlabored ventilation, respiratory function stable and patient connected to nasal cannula oxygen Cardiovascular status: blood pressure returned to baseline and stable Postop Assessment: no apparent nausea or vomiting Anesthetic complications: no   No notable events documented.  Last Vitals:  Vitals:   02/07/24 0516 02/07/24 0826  BP: 122/65 102/64  Pulse: 86 94  Resp: 14 15  Temp: 36.8 C 36.6 C  SpO2: 98% 98%    Last Pain:  Vitals:   02/07/24 0835  TempSrc:   PainSc: 8                  [place] Ade

## 2024-02-07 NOTE — [place] FL2 (Signed)
 [place]  MEDICAID FL2 LEVEL OF CARE FORM     IDENTIFICATION  Patient Name: [name] Birthdate: 05/17/1934 Sex: female Admission Date (Current Location): 02/06/2024  [facility] and [place] Number:  Producer, television/film/video and Address:  [facility] [place], [address] [place], [place] [address]      Provider Number: [id number]  Attending Physician Name and Address:  Leotis Bogus, MD  Relative Name and Phone Number:  Jalen, Oberry [phone]    Current Level of Care: Hospital Recommended Level of Care: Skilled Nursing Facility Prior Approval Number:    Date Approved/Denied:   PASRR Number:    Discharge Plan: SNF    Current Diagnoses: Patient Active Problem List   Diagnosis Date Noted   Closed fracture of right distal femur (HCC) 02/06/2024   Macrocytic anemia 02/06/2024   Hyponatremia 02/06/2024   Gastroesophageal reflux disease 02/06/2024   Heart failure with mildly reduced ejection fraction (HFmrEF) (HCC) 01/18/2023   Frequent falls 01/18/2023   Dilated cardiomyopathy (HCC) 10/04/2022   Nonrheumatic aortic valve insufficiency 09/05/2022   Frequent headaches 09/05/2022   PVC's (premature ventricular contractions) 07/06/2021   Pleural effusion, left 07/06/2021   History of CVA (cerebrovascular accident)    Leg weakness 04/19/2021   Hypokalemia 04/19/2021   Confusion 05/20/2019   Post concussive syndrome 05/20/2019   Insomnia 04/10/2019   Peripheral neuropathy 12/27/2017   MGUS (monoclonal gammopathy of unknown significance) 12/27/2017   Lumbar stenosis with neurogenic claudication 12/29/2015   CAD (coronary artery disease) 03/07/2014   Hyperlipidemia 03/07/2014   Essential hypertension 03/07/2014   Fracture of vertebra    Osteoporosis    DI (detrusor instability)    Menopausal symptoms    Anxiety disorder 07/03/2009    Orientation RESPIRATION BLADDER Height & Weight     Self, Time, Situation, Place  O2 Continent Weight:    Height:     BEHAVIORAL SYMPTOMS/MOOD NEUROLOGICAL BOWEL NUTRITION STATUS        Diet (see discharge summary)  AMBULATORY STATUS COMMUNICATION OF NEEDS Skin   Total Care Verbally Surgical wounds, Other (Comment) (ecchymosis)                       Personal Care Assistance Level of Assistance  Bathing, Feeding, Dressing Bathing Assistance: Maximum assistance Feeding assistance: Limited assistance Dressing Assistance: Maximum assistance     Functional Limitations Info  Sight, Hearing, Speech Sight Info: Adequate Hearing Info: Adequate Speech Info: Adequate    SPECIAL CARE FACTORS FREQUENCY  PT (By licensed PT), OT (By licensed OT)     PT Frequency: 5x week OT Frequency: 5x week            Contractures Contractures Info: Not present    Additional Factors Info  Code Status, Allergies Code Status Info: full Allergies Info: NKA           Current Medications (02/07/2024):  This is the current hospital active medication list Current Facility-Administered Medications  Medication Dose Route Frequency Provider Last Rate Last Admin   acetaminophen  (TYLENOL ) tablet 325-650 mg  325-650 mg Oral Q6H PRN Deward Eck, PA-C   325 mg at 02/07/24 1116   ALPRAZolam  (XANAX ) tablet 0.25 mg  0.25 mg Oral BID PRN Celinda Alm Lot, MD   0.25 mg at 02/07/24 0825   aspirin  EC tablet 81 mg  81 mg Oral Daily Celinda Alm Lot, MD   81 mg at 02/07/24 1059   docusate sodium  (COLACE) capsule 100 mg  100  mg Oral BID Deward Eck, PA-C   100 mg at 02/07/24 1100   enoxaparin  (LOVENOX ) injection 40 mg  40 mg Subcutaneous Q24H Deward Eck, PA-C   40 mg at 02/07/24 0825   escitalopram  (LEXAPRO ) tablet 10 mg  10 mg Oral BID Celinda Alm Lot, MD   10 mg at 02/07/24 1059   fentaNYL  (SUBLIMAZE ) injection 12.5-50 mcg  12.5-50 mcg Intravenous Q2H PRN Deward Eck, PA-C   50 mcg at 02/06/24 1022   HYDROcodone -acetaminophen  (NORCO) 7.5-325 MG per tablet 1 tablet  1 tablet Oral Q4H PRN Deward Eck, PA-C    1 tablet at 02/07/24 9164   HYDROcodone -acetaminophen  (NORCO/VICODIN) 5-325 MG per tablet 1 tablet  1 tablet Oral Q4H PRN Deward Eck, PA-C   1 tablet at 02/07/24 0435   levothyroxine  (SYNTHROID ) tablet 25 mcg  25 mcg Oral Q0600 Celinda Alm Lot, MD   25 mcg at 02/07/24 9488   metoprolol  succinate (TOPROL -XL) 24 hr tablet 75 mg  75 mg Oral Daily Celinda Alm Lot, MD       morphine  (PF) 2 MG/ML injection 0.5-1 mg  0.5-1 mg Intravenous Q2H PRN Deward Eck, PA-C       pantoprazole  (PROTONIX ) EC tablet 40 mg  40 mg Oral Daily Celinda Alm Lot, MD   40 mg at 02/07/24 1100   prochlorperazine  (COMPAZINE ) injection 5 mg  5 mg Intravenous Q4H PRN Deward Eck, PA-C   5 mg at 02/06/24 2257   pyridOXINE  (VITAMIN B6) tablet 500 mg  500 mg Oral Daily Celinda Alm Lot, MD   500 mg at 02/07/24 1100   rosuvastatin  (CRESTOR ) tablet 20 mg  20 mg Oral Daily Celinda Alm Lot, MD   20 mg at 02/07/24 1059   spironolactone  (ALDACTONE ) tablet 25 mg  25 mg Oral Daily Celinda Alm Lot, MD   25 mg at 02/07/24 1059   zolpidem  (AMBIEN ) tablet 5 mg  5 mg Oral QHS PRN Celinda Alm Lot, MD         Discharge Medications: Please see discharge summary for a list of discharge medications.  Relevant Imaging Results:  Relevant Lab Results:   Additional Information SSN: [id number]  Bridget Cordella Simmonds, LCSW

## 2024-02-07 NOTE — Evaluation (Signed)
 Physical Therapy Evaluation Patient Details Name: [name] MRN: [id number] DOB: Apr 04, 1934 Today's Date: 02/07/2024  History of Present Illness  [name] is an 89 y.o. female admitted 02/06/24 following mechanical fall at home in her bathroom sustaining a comminuted right distal femur supracondylar fracture. Pt s/p ORIF 9/9. PMHx: anxiety, OA, colon polyps, CAD, dilated cardiomyopathy, chronic systolic CHF, GERD, hiatal hernia, HLD, HTN, hypothyroidism, MGUS, osteoporosis, peripheral neuropathy, restless leg syndrome, nonhemorrhagic CVA, TIAs, detrusor instability, and vertebral fracture.   Clinical Impression  Pt admitted with above diagnosis. PTA, pt was modI for functional mobility using a rollator and modI for ADLs. She has a significant fall history reporting 6 in August alone and one this month leading to current admission. Pt relies on family for IADLs. She lives alone in a one story house with 6-10 STE. Pt currently with functional limitations due to the deficits listed below (see PT Problem List). She required CGA-minA for bed mobility, modA x2 for sit<>stand using RW, and max-totalA x2 for bed>chair stand pivot transfer using RW. Pt was unable to ambulate. She is limited by pain, decreased activity tolerance, impaired balance, generalized weakness, and fear of falling. Pt will benefit from acute skilled PT to increase her independence and safety with mobility to allow discharge. Recommend continued inpatient follow up therapy, <3 hours/day.    If plan is discharge home, recommend the following: Two people to help with walking and/or transfers;Two people to help with bathing/dressing/bathroom;Assistance with cooking/housework;Assist for transportation;Help with stairs or ramp for entrance   Can travel by private vehicle   No    Equipment Recommendations Hospital bed;Hoyer lift;Wheelchair (measurements PT);Wheelchair cushion (measurements PT)  Recommendations for Other Services        Functional Status Assessment Patient has had a recent decline in their functional status and demonstrates the ability to make significant improvements in function in a reasonable and predictable amount of time.     Precautions / Restrictions Precautions Precautions: Fall Recall of Precautions/Restrictions: Impaired Precaution/Restrictions Comments: Pt fearful of falling Restrictions Weight Bearing Restrictions Per Provider Order: Yes RLE Weight Bearing Per Provider Order: Touchdown weight bearing (for approximately 4 weeks then advance as tolerated. Continue to use Fait and ambulate with assistance.)      Mobility  Bed Mobility Overal bed mobility: Needs Assistance Bed Mobility: Supine to Sit     Supine to sit: Contact guard, Min assist, HOB elevated, Used rails     General bed mobility comments: Pt sat up on L side of bed with increased time. Cues for sequencing. Assist to manage RLE and elevate trunk. Scooted fwd til feet flat with use of bed pad.    Transfers Overall transfer level: Needs assistance Equipment used: Rolling Geck (2 wheels) Transfers: Sit to/from Stand, Bed to chair/wheelchair/BSC Sit to Stand: Mod assist, +2 physical assistance, From elevated surface Stand pivot transfers: Max assist, Total assist, +2 physical assistance, From elevated surface         General transfer comment: Pt stood from raised bed height. Cued proper hand placement using RW. Powered up with modA x2 and use of bed pad to facilitate full hip ext. Pt quickly fatiguing and c/o fear of falling. Returned to sit EOB. Brought recliner chair close by on her left. Transferred with max-totalA x2. Pt pivoting on left foot with step-by-step cues and therapist facilitation of hips to turn and guide pt to sit. Bed pad utilized to maintain hips fwd and upright posture.    Ambulation/Gait  General Gait Details: Unable  Stairs            Wheelchair Mobility      Tilt Bed    Modified Rankin (Stroke Patients Only)       Balance Overall balance assessment: Needs assistance Sitting-balance support: No upper extremity supported, Feet supported Sitting balance-Leahy Scale: Fair   Postural control: Left lateral lean (pt offloading R hip d/t pain. Able to achieve neutral posture with increased time.) Standing balance support: Bilateral upper extremity supported, During functional activity, Reliant on assistive device for balance Standing balance-Leahy Scale: Poor                               Pertinent Vitals/Pain Pain Assessment Pain Assessment: 0-10 Pain Score: 8  Pain Location: R hip Pain Descriptors / Indicators: Operative site guarding, Grimacing, Discomfort, Aching Pain Intervention(s): Premedicated before session, Monitored during session, Limited activity within patient's tolerance, Repositioned    Home Living Family/patient expects to be discharged to:: Private residence Living Arrangements: Alone Available Help at Discharge: Family;Available 24 hours/day (daughter lives next door and is retired. Son lives within driving distance and is also retired.) Type of Home: House Home Access: Stairs to enter Entrance Stairs-Rails: Right;Left;Can reach both (at back door) Secretary/administrator of Steps: 10 (at back door; 6 at front door)   Home Layout: One level Home Equipment: Grab bars - tub/shower;Shower seat;Grab bars - toilet;Rollator (4 wheels);Cane - single point Additional Comments: Pt reports her daughter checks in on her daily. Son lives within driving distance and is also retired.    Prior Function Prior Level of Function : Independent/Modified Independent;Needs assist;History of Falls (last six months)             Mobility Comments: Uses a Rollator or cane. Pt reports 6 falls in August ADLs Comments: Mod I; uses a weekly medication planner and daughter assists with setting it up; pt receives Meals on Wheels;  [place] provides transportation     Extremity/Trunk Assessment   Upper Extremity Assessment Upper Extremity Assessment: Defer to OT evaluation    Lower Extremity Assessment Lower Extremity Assessment: Generalized weakness;RLE deficits/detail RLE Deficits / Details: Pt with limited AROM hip/knee. She guarded against PROM. Grossly 2/5 hip and 3-/5 knee strength. RLE: Unable to fully assess due to pain RLE Sensation: history of peripheral neuropathy RLE Coordination: decreased gross motor    Cervical / Trunk Assessment Cervical / Trunk Assessment: Other exceptions Cervical / Trunk Exceptions: Hx of 3 vertebra fxs  Communication   Communication Communication: Impaired Factors Affecting Communication: Hearing impaired    Cognition Arousal: Lethargic, Suspect due to medications, Alert Behavior During Therapy: WFL for tasks assessed/performed, Anxious   PT - Cognitive impairments: No family/caregiver present to determine baseline                       PT - Cognition Comments: Pt A,Ox4. She intermittently closed her eyes and drifted off responding to tactile stimulation to maintain participation. Level of arousal approved upon sitting EOB. Pt nervous to mobilize d/t fear of falling. Following commands: Intact       Cueing Cueing Techniques: Verbal cues, Gestural cues, Visual cues     General Comments General comments (skin integrity, edema, etc.): Pt greeted on 2L O2 via [place] with SpO2 98%. Weaned to RA with pt intermittently desaturating to 80s, likely d/t holding breath during session. Cued PLB technique. Reapplied 2L during mobility with  SpO2 >90% throughout activity.    Exercises     Assessment/Plan    PT Assessment Patient needs continued PT services  PT Problem List Decreased strength;Decreased range of motion;Decreased activity tolerance;Decreased balance;Decreased mobility;Decreased knowledge of use of DME;Decreased safety awareness;Decreased knowledge of  precautions;Pain       PT Treatment Interventions DME instruction;Gait training;Stair training;Functional mobility training;Therapeutic activities;Therapeutic exercise;Balance training;Patient/family education    PT Goals (Current goals can be found in the Care Plan section)  Acute Rehab PT Goals Patient Stated Goal: Go to rehab and get better PT Goal Formulation: With patient Time For Goal Achievement: 02/21/24 Potential to Achieve Goals: Fair    Frequency Min 2X/week     Co-evaluation PT/OT/SLP Co-Evaluation/Treatment: Yes Reason for Co-Treatment: For patient/therapist safety;To address functional/ADL transfers PT goals addressed during session: Mobility/safety with mobility;Balance;Proper use of DME         AM-PAC PT 6 Clicks Mobility  Outcome Measure Help needed turning from your back to your side while in a flat bed without using bedrails?: A Little Help needed moving from lying on your back to sitting on the side of a flat bed without using bedrails?: A Little Help needed moving to and from a bed to a chair (including a wheelchair)?: Total Help needed standing up from a chair using your arms (e.g., wheelchair or bedside chair)?: Total Help needed to walk in hospital room?: Total Help needed climbing 3-5 steps with a railing? : Total 6 Click Score: 10    End of Session Equipment Utilized During Treatment: Gait belt Activity Tolerance: Patient limited by pain Patient left: in chair;with call bell/phone within reach;with chair alarm set Nurse Communication: Mobility status;Weight bearing status PT Visit Diagnosis: Pain;Difficulty in walking, not elsewhere classified (R26.2);Other abnormalities of gait and mobility (R26.89);Unsteadiness on feet (R26.81) Pain - Right/Left: Right Pain - part of body: Hip    Time: 8984-8950 PT Time Calculation (min) (ACUTE ONLY): 34 min   Charges:   PT Evaluation $PT Eval Moderate Complexity: 1 Mod   PT General Charges $$ ACUTE  PT VISIT: 1 Visit         Randall SAUNDERS, PT, DPT Acute Rehabilitation Services Office: [phone] Secure Chat Preferred  Delon CHRISTELLA Callander 02/07/2024, 12:40 PM

## 2024-02-07 NOTE — Progress Notes (Addendum)
 Orthopaedic Trauma Service Progress Note  Patient ID: [name] MRN: [id number] DOB/AGE: 02-27-1934 89 y.o.  Subjective:  No acute issues Reports current pain level 8 out of 10 but is falling asleep and looks comfortable  Lives by herself but family is nearby Uses a rolling Brisk at baseline  Labs show hypervitaminosis D, corrected calcium  levels appear normal  Vitals look good  ROS  Today's  total administered Morphine  Milligram Equivalents: 12.5 Yesterday's total administered Morphine  Milligram Equivalents: 53.75  Objective:   VITALS:   Vitals:   02/06/24 2015 02/07/24 0000 02/07/24 0516 02/07/24 0826  BP: 130/66 113/63 122/65 102/64  Pulse:  72 86 94  Resp:   14 15  Temp: 98 F (36.7 C) 98 F (36.7 C) 98.2 F (36.8 C) 97.8 F (36.6 C)  TempSrc:  Oral Oral   SpO2:  94% 98% 98%    Estimated body mass index is 23.24 kg/m as calculated from the following:   Height as of 01/19/24: 5' (1.524 m).   Weight as of 01/19/24: 54 kg.   Intake/Output      09/09 0701 09/10 0700 09/10 0701 09/11 0700   I.V. 1000    IV Piggyback 500    Total Intake 1500    Blood 50    Total Output 50    Net +1450           LABS  Results for orders placed or performed during the hospital encounter of 02/06/24 (from the past 24 hours)  CBC     Status: Abnormal   Collection Time: 02/06/24  8:44 PM  Result Value Ref Range   WBC 10.6 (H) 4.0 - 10.5 K/uL   RBC 2.63 (L) 3.87 - 5.11 MIL/uL   Hemoglobin 8.7 (L) 12.0 - 15.0 g/dL   HCT 73.1 (L) 63.9 - 53.9 %   MCV 101.9 (H) 80.0 - 100.0 fL   MCH 33.1 26.0 - 34.0 pg   MCHC 32.5 30.0 - 36.0 g/dL   RDW 86.2 88.4 - 84.4 %   Platelets 308 150 - 400 K/uL   nRBC 0.0 0.0 - 0.2 %  Creatinine, serum     Status: None   Collection Time: 02/06/24  8:44 PM  Result Value Ref Range   Creatinine, Ser 0.76 0.44 - 1.00 mg/dL   GFR, Estimated >39 >39 mL/min  CBC      Status: Abnormal   Collection Time: 02/07/24  4:38 AM  Result Value Ref Range   WBC 7.9 4.0 - 10.5 K/uL   RBC 2.39 (L) 3.87 - 5.11 MIL/uL   Hemoglobin 7.9 (L) 12.0 - 15.0 g/dL   HCT 75.3 (L) 63.9 - 53.9 %   MCV 102.9 (H) 80.0 - 100.0 fL   MCH 33.1 26.0 - 34.0 pg   MCHC 32.1 30.0 - 36.0 g/dL   RDW 86.1 88.4 - 84.4 %   Platelets 263 150 - 400 K/uL   nRBC 0.0 0.0 - 0.2 %  Comprehensive metabolic panel     Status: Abnormal   Collection Time: 02/07/24  4:38 AM  Result Value Ref Range   Sodium 131 (L) 135 - 145 mmol/L   Potassium 4.2 3.5 - 5.1 mmol/L   Chloride 98 98 - 111 mmol/L   CO2 24 22 - 32 mmol/L   Glucose, Bld  145 (H) 70 - 99 mg/dL   BUN 29 (H) 8 - 23 mg/dL   Creatinine, Ser 9.11 0.44 - 1.00 mg/dL   Calcium  8.7 (L) 8.9 - 10.3 mg/dL   Total Protein 5.0 (L) 6.5 - 8.1 g/dL   Albumin 3.0 (L) 3.5 - 5.0 g/dL   AST 862 (H) 15 - 41 U/L   ALT 150 (H) 0 - 44 U/L   Alkaline Phosphatase 56 38 - 126 U/L   Total Bilirubin 0.9 0.0 - 1.2 mg/dL   GFR, Estimated >39 >39 mL/min   Anion gap 9 5 - 15    Latest Reference Range & Units 02/07/24 04:38  Vitamin D , 25-Hydroxy 30 - 100 ng/mL 119.72 (H)  (H): Data is abnormally high   PHYSICAL EXAM:   Gen: Lying in bed, no acute distress, sleepy but able to participate in exam Lungs: Unlabored Ext:       Right Lower Extremity Dressings have some scant punctate bloody drainage but otherwise clean  Mild chronic soft tissue changes noted distally consistent with PVD  Extremity is warm  No DCT  Compartments are soft  No pain out of proportion with passive stretching of toes or ankle  DPN, SPN, TN sensory functions are intact  EHL, FHL, lesser toe motor functions intact  Ankle flexion, extension, inversion eversion intact  + DP pulse  Assessment/Plan: 1 Day Post-Op   Principal Problem:   Closed fracture of right distal femur (HCC) Active Problems:   Osteoporosis   CAD (coronary artery disease)   Hyperlipidemia   Essential  hypertension   Heart failure with mildly reduced ejection fraction (HFmrEF) (HCC)   Macrocytic anemia   Hyponatremia   Anxiety disorder   Insomnia   Anti-infectives (From admission, onward)    Start     Dose/Rate Route Frequency Ordered Stop   02/06/24 1430  ceFAZolin  (ANCEF ) IVPB 2g/100 mL premix        2 g 200 mL/hr over 30 Minutes Intravenous  Once 02/06/24 1420 02/06/24 1500   02/06/24 1403  ceFAZolin  (ANCEF ) 2-4 GM/100ML-% IVPB       Note to Pharmacy: [name] M: cabinet override      02/06/24 1403 02/06/24 1507     .  POD/HD#: 96  89 year old female ground-level fall, Wheat dependent with right periprosthetic distal femur fracture  - Ground-level fall  -Right periprosthetic distal femur fracture s/p ORIF Weightbearing Touchdown weightbearing right leg for approximately 4 weeks then advance as tolerated.  Continue to use Yanda and ambulate with assistance   ROM/Activity   Unrestricted range of motion of right knee and hip   Activity as tolerated while maintaining weightbearing restrictions as noted above    Therapy evaluations    Ice and elevate for swelling and pain control   Do not let knee rest in flexion to prevent contracture.  Can use a pillow or the bone foam  Wound care   Dressing changes starting tomorrow  - Pain management:  Minimize narcotics  Patient somewhat somnolent on exam today   Will schedule her regular Tylenol  and back off on Norco  - ABL anemia/Hemodynamics  Check H&H this afternoon.  Likely require a unit of PRBCs  - Medical issues   Per primary  - DVT/PE prophylaxis:  Lovenox , SCDs  Recommend Eliquis  2.5 mg p.o. twice daily for 30 days at discharge  - ID:   Perioperative antibiotics  - Metabolic Bone Disease:  Fracture is indicative of a fragility fracture  Labs show hypervitaminosis  D   Sees vitamin D  supplements  Continue to monitor  - Activity:  As above  - FEN/GI prophylaxis/Foley/Lines:  Diet as  tolerated  - Impediments to fracture healing:  Age/fragility fracture   - Dispo:  Therapy evaluations  TOC consult for SNF   Francis MICAEL Mt, PA-C [phone] (C) 02/07/2024, 9:33 AM  [facility] [address] [place] [address] [phone] MAXIMINO MILLING (F)    After 5pm and on the weekends please log on to Amion, go to orthopaedics and the look under the [facility] Call for the provider(s) on call. You can also call our office at [phone] and then follow the prompts to be connected to the call team.  Patient ID: [name], female   DOB: 1934-02-23, 89 y.o.   MRN: [id number]

## 2024-02-07 NOTE — TOC Initial Note (Addendum)
 Transition of Care [facility]) - Initial/Assessment Note    Patient Details  Name: [name] MRN: [id number] Date of Birth: 1933-12-15  Transition of Care [facility] CM/SW Contact:    Bridget Cordella Simmonds, LCSW Phone Number: 02/07/2024, 1:33 PM  Clinical Narrative:     CSW spoke with pt and son [name] regarding PT recommendation for SNF.  Permission given to speak with son and with daughter Holley.  They are agreeable to SNF, requesting [facility].  Pt from home alone, no current services.  Referral sent out in hub to SNF.  CSW reached out to [facility] to review.   PASSR requires additional information.     1410: [facility] does offer bed.  CSW spoke with pt and son, they do accept offer.  Medicare payer with inpt order 02/06/24.               Expected Discharge Plan: Skilled Nursing Facility Barriers to Discharge: Continued Medical Work up, SNF Pending bed offer   Patient Goals and CMS Choice Patient states their goals for this hospitalization and ongoing recovery are:: use leg again   Choice offered to / list presented to : Patient (pt requesting Therapist, occupational)      Expected Discharge Plan and Services In-house Referral: Clinical Social Work   Post Acute Care Choice: Skilled Nursing Facility Living arrangements for the past 2 months: Single Family Home                                      Prior Living Arrangements/Services Living arrangements for the past 2 months: Single Family Home Lives with:: Self Patient language and need for interpreter reviewed:: Yes Do you feel safe going back to the place where you live?: Yes      Need for Family Participation in Patient Care: Yes (Comment) Care giver support system in place?: Yes (comment) Current home services: Other (comment) (none) Criminal Activity/Legal Involvement Pertinent to Current Situation/Hospitalization: No - Comment as needed  Activities of Daily Living      Permission  Sought/Granted Permission sought to share information with : Family Supports Permission granted to share information with : Yes, Verbal Permission Granted  Share Information with NAME: son [name], daughter Holley  Permission granted to share info w AGENCY: SNF        Emotional Assessment Appearance:: Appears stated age Attitude/Demeanor/Rapport: Engaged Affect (typically observed): Appropriate, Pleasant Orientation: : Oriented to Self, Oriented to Place, Oriented to  Time, Oriented to Situation      Admission diagnosis:  Closed fracture of right distal femur (HCC) [S72.401A] Periprosthetic fracture of proximal end of femur [F02.8XXA, Z96.649] Patient Active Problem List   Diagnosis Date Noted   Closed fracture of right distal femur (HCC) 02/06/2024   Macrocytic anemia 02/06/2024   Hyponatremia 02/06/2024   Gastroesophageal reflux disease 02/06/2024   Heart failure with mildly reduced ejection fraction (HFmrEF) (HCC) 01/18/2023   Frequent falls 01/18/2023   Dilated cardiomyopathy (HCC) 10/04/2022   Nonrheumatic aortic valve insufficiency 09/05/2022   Frequent headaches 09/05/2022   PVC's (premature ventricular contractions) 07/06/2021   Pleural effusion, left 07/06/2021   History of CVA (cerebrovascular accident)    Leg weakness 04/19/2021   Hypokalemia 04/19/2021   Confusion 05/20/2019   Post concussive syndrome 05/20/2019   Insomnia 04/10/2019   Peripheral neuropathy 12/27/2017   MGUS (monoclonal gammopathy of unknown significance) 12/27/2017   Lumbar stenosis with neurogenic claudication 12/29/2015  CAD (coronary artery disease) 03/07/2014   Hyperlipidemia 03/07/2014   Essential hypertension 03/07/2014   Fracture of vertebra    Osteoporosis    DI (detrusor instability)    Menopausal symptoms    Anxiety disorder 07/03/2009   PCP:  Shepard Ade, MD Pharmacy:   [facility] [phone] - [place], [place] - [address] [place] [address] [place] [address] [place] [address] Phone: [phone] Fax: [phone]  [facility] - [place], [place] - [address] [place] [address] Phone: [phone] Fax: [phone]  [id number] - [place], [place] - [address] [facility] [address] [place] [address] Phone: [phone] Fax: [phone]     Social Drivers of Health (SDOH) Social History: SDOH Screenings   Food Insecurity: No Food Insecurity (02/07/2024)  Housing: Low Risk  (02/07/2024)  Transportation Needs: No Transportation Needs (02/07/2024)  Utilities: Not At Risk (02/07/2024)  Depression (PHQ2-9): Low Risk  (12/21/2023)  Social Connections: Unknown (02/07/2024)  Tobacco Use: Low Risk  (02/06/2024)   SDOH Interventions:     Readmission Risk Interventions     No data to display

## 2024-02-07 NOTE — Plan of Care (Signed)

## 2024-02-07 NOTE — Progress Notes (Signed)
 PROGRESS NOTE    KATHRINE RIEVES  FMW:[id number] DOB: 09-26-1933 DOA: 02/06/2024 PCP: Shepard Ade, MD   Brief Narrative:  This 89 yrs old female with PMH significant for anxiety, osteoarthritis, colon polyps, CAD, dilated cardiomyopathy, chronic systolic CHF, GERD, hiatal hernia, hyperlipidemia, hypertension, hypothyroidism, and MGUS, osteoporosis, peripheral neuropathy, history of pneumonia, restless leg syndrome, history of nonhemorrhagic CVA, history of TIAs, TMJ click, varicose veins, depression, detrusor instability,  vertebral fracture x 3 who had a mechanical fall at home in her bathroom injuring her right knee resulting in the area having deformity, pain and inability to bear weight on her right knee.  Right knee x-ray show an oblique periprosthetic fracture of the distal right femur. CT of the right femur show a comminuted, periprosthetic fracture of the distal right femur.  Patient was admitted for further evaluation.  Orthopedics is consulted.  Assessment & Plan:   Principal Problem:   Closed fracture of right distal femur (HCC) Active Problems:   Osteoporosis   CAD (coronary artery disease)   Hyperlipidemia   Essential hypertension   Heart failure with mildly reduced ejection fraction (HFmrEF) (HCC)   Macrocytic anemia   Hyponatremia   Anxiety disorder   Insomnia  Closed fracture of right distal femur [facility]): Patient presented status post mechanical fall at home. Xray Right Knee > shows an oblique periprosthetic fracture of the distal right femur. Continue Icing to the area. Buck's traction per protocol. Analgesics as needed. Antiemetics as needed. Orthopedic consulted.  Status post ORIF.  POD #1. Continue to use Brener and ambulate with assistance. Recommend Eliquis  2.5 mg twice daily for 30 days at discharge. PT and OT evaluation.  Osteoporosis: Continue calcium  and vitamin D . Would benefit from further treatment. Follow-up with primary care provider.   CAD  (coronary artery disease): Continue ASA, beta-blocker and statin.   Hyperlipidemia: Continue rosuvastatin  20 mg p.o. daily.   Essential hypertension: Heart failure with mildly reduced ejection fraction (HFmrEF) (HCC) Continue metoprolol  succinate 50 mg p.o. daily. Hold ARB and spironolactone .   Macrocytic anemia; Monitor hematocrit and hemoglobin. She usually has B12 levels over normal.   Hyponatremia: Minimal.Unknown significance. Monitor electrolytes.  Anxiety disorder Continue Lexapro  10 mg p.o. twice daily.   Insomnia: Continue Ambien  at a reduced dose over insomnia.   DVT prophylaxis: (Lovenox ) Code Status: Full code Family Communication: No family at bed side. Disposition Plan:    Status is: Inpatient Remains inpatient appropriate because: Status post ORIF.  POD #1.  PT and OT for SNF placement    Consultants:  Orthopeadics  Procedures: ORIF  Antimicrobials: Anti-infectives (From admission, onward)    Start     Dose/Rate Route Frequency Ordered Stop   02/06/24 1430  ceFAZolin  (ANCEF ) IVPB 2g/100 mL premix        2 g 200 mL/hr over 30 Minutes Intravenous  Once 02/06/24 1420 02/06/24 1500   02/06/24 1403  ceFAZolin  (ANCEF ) 2-4 GM/100ML-% IVPB       Note to Pharmacy: [name] M: cabinet override      02/06/24 1403 02/06/24 1507      Subjective: Patient was seen and examined at bedside. Overnight events noted. Patient is status post ORIF POD #1.  She reports feeling better, pain is controlled  Objective: Vitals:   02/07/24 0516 02/07/24 0826 02/07/24 1059 02/07/24 1243  BP: 122/65 102/64 (!) 96/54 (!) 105/43  Pulse: 86 94 86 91  Resp: 14 15  16   Temp: 98.2 F (36.8 C) 97.8 F (36.6 C)  97.7  F (36.5 C)  TempSrc: Oral     SpO2: 98% 98%      Intake/Output Summary (Last 24 hours) at 02/07/2024 1423 Last data filed at 02/07/2024 0835 Gross per 24 hour  Intake 1000 ml  Output 300 ml  Net 700 ml   There were no vitals filed for this  visit.  Examination:  General exam: Appears calm and comfortable, deconditioned, not in any acute distress. Respiratory system: CTA Bilaterally. Respiratory effort normal. RR 17 Cardiovascular system: S1 & S2 heard, RRR. No JVD, murmurs, rubs, gallops or clicks Gastrointestinal system: Abdomen is non distended, soft and non tender. Normal bowel sounds heard. Central nervous system: Alert and oriented x 3 . No focal neurological deficits. Extremities: Right hip status post ORIF.  POD #1. Skin: No rashes, lesions or ulcers Psychiatry: Judgement and insight appear normal. Mood & affect appropriate.     Data Reviewed: I have personally reviewed following labs and imaging studies  CBC: Recent Labs  Lab 02/06/24 0443 02/06/24 2044 02/07/24 0438  WBC 13.8* 10.6* 7.9  NEUTROABS 11.2*  --   --   HGB 10.1* 8.7* 7.9*  HCT 32.7* 26.8* 24.6*  MCV 103.8* 101.9* 102.9*  PLT 331 308 263   Basic Metabolic Panel: Recent Labs  Lab 02/06/24 0443 02/06/24 2044 02/07/24 0438  NA 133*  --  131*  K 4.1  --  4.2  CL 99  --  98  CO2 23  --  24  GLUCOSE 131*  --  145*  BUN 28*  --  29*  CREATININE 0.78 0.76 0.88  CALCIUM  9.8  --  8.7*  MG 2.1  --   --   PHOS 3.3  --   --    GFR: CrCl cannot be calculated (Unknown ideal weight.). Liver Function Tests: Recent Labs  Lab 02/07/24 0438  AST 137*  ALT 150*  ALKPHOS 56  BILITOT 0.9  PROT 5.0*  ALBUMIN 3.0*   No results for input(s): LIPASE, AMYLASE in the last 168 hours. No results for input(s): AMMONIA in the last 168 hours. Coagulation Profile: No results for input(s): INR, PROTIME in the last 168 hours. Cardiac Enzymes: No results for input(s): CKTOTAL, CKMB, CKMBINDEX, TROPONINI in the last 168 hours. BNP (last 3 results) No results for input(s): PROBNP in the last 8760 hours. HbA1C: No results for input(s): HGBA1C in the last 72 hours. CBG: No results for input(s): GLUCAP in the last 168 hours. Lipid  Profile: No results for input(s): CHOL, HDL, LDLCALC, TRIG, CHOLHDL, LDLDIRECT in the last 72 hours. Thyroid  Function Tests: No results for input(s): TSH, T4TOTAL, FREET4, T3FREE, THYROIDAB in the last 72 hours. Anemia Panel: No results for input(s): VITAMINB12, FOLATE, FERRITIN, TIBC, IRON, RETICCTPCT in the last 72 hours. Sepsis Labs: No results for input(s): PROCALCITON, LATICACIDVEN in the last 168 hours.  No results found for this or any previous visit (from the past 240 hours).  Radiology Studies: DG Knee Right Port Result Date: 02/06/2024 CLINICAL DATA:  Fracture repair EXAM: PORTABLE RIGHT KNEE - 1-2 VIEW COMPARISON:  02/06/2024 FINDINGS: Right knee replacement. Interval lateral plate and screw fixation of the distal femoral periprosthetic fracture with residual 1/4 shaft diameter medial and anterior displacement of distal fracture fragment. Gas in the soft tissues consistent with recent surgery IMPRESSION: Interval plate and screw fixation of distal femoral periprosthetic fracture. Electronically Signed   By: Luke Bun M.D.   On: 02/06/2024 19:48   DG FEMUR, MIN 2 VIEWS RIGHT Result Date: 02/06/2024  CLINICAL DATA:  461500 Elective surgery [id number]. EXAM: RIGHT FEMUR 2 VIEWS COMPARISON:  None Available. FINDINGS: Multiple intraoperative spot radiographs are submitted for review. Knee arthroplasty is noted. There is spiral fracture of the distal femur. The subsequent images, there is ORIF with plate and screws. Please refer to the separately issued operative report for further details. Fluoroscopy time: 79.4 seconds Radiation dose: 7.26 mGy IMPRESSION: *Intraoperative support. Electronically Signed   By: Ree Molt M.D.   On: 02/06/2024 17:57   DG C-Arm 1-60 Min-No Report Result Date: 02/06/2024 Fluoroscopy was utilized by the requesting physician.  No radiographic interpretation.   DG C-Arm 1-60 Min-No Report Result Date: 02/06/2024 Fluoroscopy was  utilized by the requesting physician.  No radiographic interpretation.   DG CHEST PORT 1 VIEW Result Date: 02/06/2024 EXAM: 1 VIEW XRAY OF THE CHEST 02/06/2024 06:16:00 AM COMPARISON: 01/19/2024 previous right shoulder arthroplasty. CLINICAL HISTORY: 455472 Closed fracture of right distal femur [facility]) [id number]; [id number] Preoperative examination [id number]. Pre-op,femur fracture FINDINGS: LUNGS AND PLEURA: No pleural fluid, interstitial edema or airspace consolidation. HEART AND MEDIASTINUM: Stable cardiomediastinal contours. BONES AND SOFT TISSUES: Postoperative changes noted within the cervical spine. Loop recorder is noted within the projection of the left lower chest. Atherosclerotic calcifications. Signs of previous kyphoplasty within the thoracic and lumbar spine. Remote healed left posterior seventh, eighth and ninth rib fractures. STOMACH AND BOWEL: Moderate-sized hiatal hernia. IMPRESSION: 1. No acute cardiopulmonary pathology. 2. Moderate-sized hiatal hernia. Electronically signed by: Waddell Calk MD 02/06/2024 06:33 AM EDT RP Workstation: [id number]   CT FEMUR RIGHT WO CONTRAST Result Date: 02/06/2024 EXAM: CT OF THE RIGHT FEMUR, WITHOUT IV CONTRAST 02/06/2024 05:20:00 AM TECHNIQUE: Axial images were acquired through the right femur without IV contrast. Reformatted images were reviewed. Automated exposure control, iterative reconstruction, and/or weight based adjustment of the mA/kV was utilized to reduce the radiation dose to as low as reasonably achievable. COMPARISON: Plain film radiographs of the right femur from 01/03/2023. CLINICAL HISTORY: Periprosthetic fracture, surgical planning. Please include full length of femur. FINDINGS: BONES: Comminuted, periprosthetic fracture of the distal right femur status post right total knee arthroplasty. The fracture fragments appear impacted with 90 degrees lateral rotation of the distal fracture fragments with approximately 45 degree proximal angulation and  approximately 4 cm impaction. Impacted fracture fragments extend up to The cement bone interface within the central aspect of the distal femoral metaphysis. Moderate right hip osteoarthritis. JOINTS: No dislocation. The joint spaces are normal. SOFT TISSUES: Signs of intramuscular edema/hematoma identified within the anterior and lateral aspects of the quadriceps muscle. IMPRESSION: 1. Comminuted, periprosthetic fracture of the distal right femur status post right total knee arthroplasty. 2. Signs of intramuscular edema/hematoma within the anterior and lateral aspects of the quadriceps muscle. 3. Moderate right hip osteoarthritis. Electronically signed by: Waddell Calk MD 02/06/2024 05:59 AM EDT RP Workstation: [id number]   DG Knee 1-2 Views Right Result Date: 02/06/2024 EXAM: 1 or 2 VIEW(S) XRAY OF THE RIGHT KNEE 02/06/2024 03:53:00 AM COMPARISON: None available. CLINICAL HISTORY: [id number] Fall 190176. Patient fell a few hours ago and injured leg, unable to bear weight, obvious deformity. FINDINGS: BONES AND JOINTS: Oblique periprosthetic fracture of the distal right femur. The fracture line extends to the anterior tip of the femoral component of the right total knee arthroplasty. There is apex posterior angulation of the fracture. Moderate knee joint effusion. SOFT TISSUES: The soft tissues are unremarkable. IMPRESSION: 1. Oblique periprosthetic fracture of the distal right femur. Electronically signed by: Norman Gatlin MD 02/06/2024 04:02 AM  EDT RP Workstation: [id number]   Scheduled Meds:  aspirin  EC  81 mg Oral Daily   docusate sodium   100 mg Oral BID   enoxaparin  (LOVENOX ) injection  40 mg Subcutaneous Q24H   escitalopram   10 mg Oral BID   levothyroxine   25 mcg Oral Q0600   metoprolol  succinate  75 mg Oral Daily   pantoprazole   40 mg Oral Daily   pyridoxine   500 mg Oral Daily   rosuvastatin   20 mg Oral Daily   spironolactone   25 mg Oral Daily   Continuous Infusions:   LOS: 1 day    Time  spent: 50 mins    Darcel Dawley, MD [facility]   If 7PM-7AM, please contact night-coverage

## 2024-02-07 NOTE — Progress Notes (Signed)
 RE:  [name]       Date of Birth: 03-18-2034      Date:  02/07/24        To Whom It May Concern:  Please be advised that the above-named patient will require a short-term nursing home stay - anticipated 30 days or less for rehabilitation and strengthening.  The plan is for return home.                 MD signature                Date

## 2024-02-08 DIAGNOSIS — S72401A Unspecified fracture of lower end of right femur, initial encounter for closed fracture: Secondary | ICD-10-CM | POA: Diagnosis not present

## 2024-02-08 LAB — CBC
HCT: 19.8 % — ABNORMAL LOW (ref 36.0–46.0)
Hemoglobin: 6.4 g/dL — CL (ref 12.0–15.0)
MCH: 33.5 pg (ref 26.0–34.0)
MCHC: 32.3 g/dL (ref 30.0–36.0)
MCV: 103.7 fL — ABNORMAL HIGH (ref 80.0–100.0)
Platelets: 233 K/uL (ref 150–400)
RBC: 1.91 MIL/uL — ABNORMAL LOW (ref 3.87–5.11)
RDW: 14.1 % (ref 11.5–15.5)
WBC: 7.7 K/uL (ref 4.0–10.5)
nRBC: 0 % (ref 0.0–0.2)

## 2024-02-08 LAB — PREPARE RBC (CROSSMATCH)

## 2024-02-08 LAB — PHOSPHORUS: Phosphorus: 3.9 mg/dL (ref 2.5–4.6)

## 2024-02-08 LAB — BASIC METABOLIC PANEL WITH GFR
Anion gap: 8 (ref 5–15)
BUN: 31 mg/dL — ABNORMAL HIGH (ref 8–23)
CO2: 25 mmol/L (ref 22–32)
Calcium: 8.8 mg/dL — ABNORMAL LOW (ref 8.9–10.3)
Chloride: 97 mmol/L — ABNORMAL LOW (ref 98–111)
Creatinine, Ser: 0.93 mg/dL (ref 0.44–1.00)
GFR, Estimated: 59 mL/min — ABNORMAL LOW (ref >60)
Glucose, Bld: 151 mg/dL — ABNORMAL HIGH (ref 70–99)
Potassium: 4.3 mmol/L (ref 3.5–5.1)
Sodium: 130 mmol/L — ABNORMAL LOW (ref 135–145)

## 2024-02-08 LAB — MAGNESIUM: Magnesium: 1.9 mg/dL (ref 1.7–2.4)

## 2024-02-08 MED ORDER — HYDROCODONE-ACETAMINOPHEN 5-325 MG PO TABS: 1.0000 | ORAL_TABLET | ORAL | 0 refills | Status: DC | PRN

## 2024-02-08 MED ORDER — ACETAMINOPHEN 325 MG PO TABS: 325.0000 mg | ORAL_TABLET | Freq: Four times a day (QID) | ORAL | 0 refills | Status: DC | PRN

## 2024-02-08 MED ORDER — SODIUM CHLORIDE 0.9% IV SOLUTION: Freq: Once | INTRAVENOUS | Status: AC

## 2024-02-08 MED ORDER — APIXABAN 2.5 MG PO TABS: 2.5000 mg | ORAL_TABLET | Freq: Two times a day (BID) | ORAL | 0 refills | Status: AC

## 2024-02-08 NOTE — Progress Notes (Signed)
 Date and time results received: 02/08/24 0656  Reported by: Gaetana, Lab Reported to: Richardean, RN   Test: Hemoglobin Critical Value: 6.4  Name of Provider Notified: Leotis  Orders Received? Or Actions Taken?: type and screen ordered per nursing orders, MD notified, incoming RN notified. Awaiting orders

## 2024-02-08 NOTE — Progress Notes (Signed)
 PT Cancellation Note  Patient Details Name: [name] MRN: [id number] DOB: 09/27/1933   Cancelled Treatment:    Reason Eval/Treat Not Completed: Medical issues which prohibited therapy (Pt with criticial Hgb of 6.4, which is a 1.5 drop from yesterday afternoon. Per RN, pt is pending transfusion and she was just notified the pt's blood is ready. Will follow-up for PT treatment when appropriate and as schedule permits.)  Randall SAUNDERS, PT, DPT Acute Rehabilitation Services Office: [phone] Secure Chat Preferred  Delon CHRISTELLA Callander 02/08/2024, 9:05 AM

## 2024-02-08 NOTE — Progress Notes (Signed)
 MEWS Progress Note  Patient Details Name: [name] MRN: [id number] DOB: 1933-11-06 Today's Date: 02/08/2024   MEWS Flowsheet Documentation:  Assess: MEWS Score Temp: 97.7 F (36.5 C) BP: (!) 110/59 MAP (mmHg): 75 Pulse Rate: (!) 115 ECG Heart Rate: 90 Resp: 15 Level of Consciousness: Alert SpO2: 95 % O2 Device: Nasal Cannula O2 Flow Rate (L/min): 2 L/min Assess: MEWS Score MEWS Temp: 0 MEWS Systolic: 0 MEWS Pulse: 2 MEWS RR: 0 MEWS LOC: 0 MEWS Score: 2 MEWS Score Color: Yellow Assess: SIRS CRITERIA SIRS Temperature : 0 SIRS Respirations : 0 SIRS Pulse: 1 SIRS WBC: 0 SIRS Score Sum : 1 SIRS Temperature : 0 SIRS Pulse: 1 SIRS Respirations : 0 SIRS WBC: 0 SIRS Score Sum : 1 Assess: if the MEWS score is Yellow or Red Were vital signs accurate and taken at a resting state?: Yes Does the patient meet 2 or more of the SIRS criteria?: No MEWS guidelines implemented : Yes, yellow Treat MEWS Interventions: Considered administering scheduled or prn medications/treatments as ordered Take Vital Signs Increase Vital Sign Frequency : Yellow: Q2hr x1, continue Q4hrs until patient remains green for 12hrs Escalate MEWS: Escalate: Yellow: Discuss with charge nurse and consider notifying provider and/or RRT Notify: Charge Nurse/RN Name of Charge Nurse/RN Notified: Delwin, RN      Richardean [name] 02/08/2024, 5:39 AM

## 2024-02-08 NOTE — Discharge Instructions (Addendum)
 Orthopaedic Trauma Service Discharge Instructions   General Discharge Instructions  Orthopaedic Injuries:  Right periprosthetic distal femur fracture treated with open reduction internal fixation using plate and screws  WEIGHT BEARING STATUS: Touchdown weightbearing right leg with assistance  RANGE OF MOTION/ACTIVITY: Unrestricted range of motion of right knee.  Activity as tolerated while maintaining weightbearing restrictions noted above  Bone health: Labs show hypervitaminosis D.  Recommend cessation of all vitamin D  products  Review the following resource for additional information regarding bone health  [email or web address]  Wound Care: Daily wound care starting upon arrival to the facility.  Okay to leave incisions open to air once there is no drainage and clean with soap and water  only   Discharge Wound Care Instructions  Do NOT apply any ointments, solutions or lotions to pin sites or surgical wounds.  These prevent needed drainage and even though solutions like hydrogen peroxide kill bacteria, they also damage cells lining the pin sites that help fight infection.  Applying lotions or ointments can keep the wounds moist and can cause them to breakdown and open up as well. This can increase the risk for infection. When in doubt call the office.  Surgical incisions should be dressed daily.  If any drainage is noted, use one layer of adaptic or Mepitel, then gauze, and tape.  Alternatively you can use a silicone foam dressing.  Recommend TED hose for swelling control  [email or web address] [email or web address]_[email or web address]  [email or web address]  These dressing supplies should be available at local medical supply stores ([employer], [employer], etc).  They are not usually carried at places like [facility], [facility], [facility], etc  Once the incision is completely dry and without drainage, it may be left open to air out.  Showering may begin 36-48 hours later.  Cleaning gently with soap and water .   DVT/PE prophylaxis: Eliquis  2.5 mg p.o. twice daily for 30 days for blood clot prevention  Diet: as you were eating previously.  Can use over the counter stool softeners and bowel preparations, such as Miralax , to help with bowel movements.  Narcotics can be constipating.  Be sure to drink plenty of fluids  PAIN MEDICATION USE AND EXPECTATIONS  You have likely been given narcotic medications to help control your pain.  After a traumatic event that results in an fracture (broken bone) with or without surgery, it is ok to use narcotic pain medications to help control one's pain.  We understand that everyone responds to pain differently and each individual patient will be evaluated on a regular basis for the continued need for narcotic medications. Ideally, narcotic medication use should last no more than 6-8 weeks (coinciding with fracture healing).   As a patient it is your responsibility as well to monitor narcotic medication use and report the amount and frequency you use these medications when you come to your office visit.   We would also advise that if you are using narcotic medications, you should take a dose prior to therapy to maximize you participation.  IF YOU ARE ON NARCOTIC MEDICATIONS IT IS NOT PERMISSIBLE TO OPERATE A MOTOR VEHICLE (MOTORCYCLE/CAR/TRUCK/MOPED) OR HEAVY MACHINERY DO NOT MIX NARCOTICS WITH OTHER CNS (CENTRAL NERVOUS SYSTEM) DEPRESSANTS SUCH AS ALCOHOL    POST-OPERATIVE OPIOID TAPER INSTRUCTIONS: It is important to wean off of your opioid medication as soon as possible. If you do not need pain medication after your surgery it is ok to stop day one. Opioids include: Codeine, Hydrocodone (Norco, Vicodin), Oxycodone (Percocet,  oxycontin ) and hydromorphone  amongst others.  Long term and  even short term use of opiods can cause: Increased pain response Dependence Constipation Depression Respiratory depression And more.  Withdrawal symptoms can include Flu like symptoms Nausea, vomiting And more Techniques to manage these symptoms Hydrate well Eat regular healthy meals Stay active Use relaxation techniques(deep breathing, meditating, yoga) Do Not substitute Alcohol  to help with tapering If you have been on opioids for less than two weeks and do not have pain than it is ok to stop all together.  Plan to wean off of opioids This plan should start within one week post op of your fracture surgery  Maintain the same interval or time between taking each dose and first decrease the dose.  Cut the total daily intake of opioids by one tablet each day Next start to increase the time between doses. The last dose that should be eliminated is the evening dose.    STOP SMOKING OR USING NICOTINE PRODUCTS!!!!  As discussed nicotine severely impairs your body's ability to heal surgical and traumatic wounds but also impairs bone healing.  Wounds and bone heal by forming microscopic blood vessels (angiogenesis) and nicotine is a vasoconstrictor (essentially, shrinks blood vessels).  Therefore, if vasoconstriction occurs to these microscopic blood vessels they essentially disappear and are unable to deliver necessary nutrients to the healing tissue.  This is one modifiable factor that you can do to dramatically increase your chances of healing your injury.    (This means no smoking, no nicotine gum, patches, etc)  DO NOT USE NONSTEROIDAL ANTI-INFLAMMATORY DRUGS (NSAID'S)  Using products such as Advil (ibuprofen), Aleve (naproxen), Motrin (ibuprofen) for additional pain control during fracture healing can delay and/or prevent the healing response.  If you would like to take over the counter (OTC) medication, Tylenol  (acetaminophen )  is ok.  However, some narcotic medications that are given for pain control contain acetaminophen  as well. Therefore, you should not exceed more than 4000 mg of tylenol  in a day if you do not have liver disease.  Also note that there are may OTC medicines, such as cold medicines and allergy medicines that my contain tylenol  as well.  If you have any questions about medications and/or interactions please ask your doctor/PA or your pharmacist.      ICE AND ELEVATE INJURED/OPERATIVE EXTREMITY  Using ice and elevating the injured extremity above your heart can help with swelling and pain control.  Icing in a pulsatile fashion, such as 20 minutes on and 20 minutes off, can be followed.    Do not place ice directly on skin. Make sure there is a barrier between to skin and the ice pack.    Using frozen items such as frozen peas works well as the conform nicely to the are that needs to be iced.  USE AN ACE WRAP OR TED HOSE FOR SWELLING CONTROL  In addition to icing and elevation, Ace wraps or TED hose are used to help limit and resolve swelling.  It is recommended to use Ace wraps or TED hose until you are informed to stop.    When using Ace Wraps start the wrapping distally (farthest away from the body) and wrap proximally (closer to the body)   Example: If you had surgery on your leg and you do not have a splint on, start the ace wrap at the toes and work your way up to the thigh        If you had surgery on your upper extremity and do not have a splint on, start the ace  wrap at your fingers and work your way up to the upper arm  IF YOU ARE IN A SPLINT OR CAST DO NOT REMOVE IT FOR ANY REASON   If your splint gets wet for any reason please contact the office immediately. You may shower in your splint or cast as long as you keep it dry.  This can be done by wrapping in a cast cover or garbage back (or similar)  Do Not stick any thing down your splint or cast such as pencils, money, or hangers to try and  scratch yourself with.  If you feel itchy take benadryl  as prescribed on the bottle for itching  IF YOU ARE IN A CAM BOOT (BLACK BOOT)  You may remove boot periodically. Perform daily dressing changes as noted below.  Wash the liner of the boot regularly and wear a sock when wearing the boot. It is recommended that you sleep in the boot until told otherwise    Call office for the following: Temperature greater than 101F Persistent nausea and vomiting Severe uncontrolled pain Redness, tenderness, or signs of infection (pain, swelling, redness, odor or green/yellow discharge around the site) Difficulty breathing, headache or visual disturbances Hives Persistent dizziness or light-headedness Extreme fatigue Any other questions or concerns you may have after discharge  In an emergency, call 911 or go to an Emergency Department at a nearby hospital  HELPFUL INFORMATION  If you had a block, it will wear off between 8-24 hrs postop typically.  This is period when your pain may go from nearly zero to the pain you would have had postop without the block.  This is an abrupt transition but nothing dangerous is happening.  You may take an extra dose of narcotic when this happens.  You should wean off your narcotic medicines as soon as you are able.  Most patients will be off or using minimal narcotics before their first postop appointment.   We suggest you use the pain medication the first night prior to going to bed, in order to ease any pain when the anesthesia wears off. You should avoid taking pain medications on an empty stomach as it will make you nauseous.  Do not drink alcoholic beverages or take illicit drugs when taking pain medications.  In most states it is against the law to drive while you are in a splint or sling.  And certainly against the law to drive while taking narcotics.  You may return to work/school in the next couple of days when you feel up to it.   Pain medication may  make you constipated.  Below are a few solutions to try in this order: Decrease the amount of pain medication if you aren't having pain. Drink lots of decaffeinated fluids. Drink prune juice and/or each dried prunes  If the first 3 don't work start with additional solutions Take Colace - an over-the-counter stool softener Take Senokot - an over-the-counter laxative Take Miralax  - a stronger over-the-counter laxative     CALL THE OFFICE WITH ANY QUESTIONS OR CONCERNS: [phone]   VISIT OUR WEBSITE FOR ADDITIONAL INFORMATION: [email or web address]

## 2024-02-08 NOTE — Progress Notes (Signed)
 Physical Therapy Treatment Patient Details Name: [name] MRN: [id number] DOB: 08/10/1933 Today's Date: 02/08/2024   History of Present Illness [name] is an 89 y.o. female admitted 02/06/24 following mechanical fall at home in her bathroom sustaining a comminuted right distal femur supracondylar fracture. Pt s/p ORIF 9/9. PMHx: anxiety, OA, colon polyps, CAD, dilated cardiomyopathy, chronic systolic CHF, GERD, hiatal hernia, HLD, HTN, hypothyroidism, MGUS, osteoporosis, peripheral neuropathy, restless leg syndrome, nonhemorrhagic CVA, TIAs, detrusor instability, and vertebral fracture.    PT Comments  Pt greeted supine in bed, pleasant and agreeable to PT session. Re-educated pt on weight bearing status, explaining and demonstrating TDWB on RLE during functional mobility using RW. She advanced OOB mobility transferring to/from [facility] and to recliner chair. Pt required step-by-step cues for sequencing and assist to maneuver AD. As she fatigued, increased physical assist needed. Pt bleeding through her dressing at incision site on right lateral thigh, MD/RN aware. Will continue to follow acutely and advance appropriately.     If plan is discharge home, recommend the following: Two people to help with walking and/or transfers;Two people to help with bathing/dressing/bathroom;Assistance with cooking/housework;Assist for transportation;Help with stairs or ramp for entrance   Can travel by private vehicle     No  Equipment Recommendations  Hospital bed;Hoyer lift;Wheelchair (measurements PT);Wheelchair cushion (measurements PT)    Recommendations for Other Services       Precautions / Restrictions Precautions Precautions: Fall Recall of Precautions/Restrictions: Impaired Precaution/Restrictions Comments: Pt very fearful of falling. Re-educated pt on weight bearing status. Restrictions Weight Bearing Restrictions Per Provider Order: Yes RLE Weight Bearing Per Provider Order: Touchdown  weight bearing     Mobility  Bed Mobility Overal bed mobility: Needs Assistance Bed Mobility: Supine to Sit     Supine to sit: Min assist, HOB elevated, Used rails     General bed mobility comments: Cues for sequencing. Instructed pt to use bedrail. Assist to manage RLE and elevate trunk. Instructed pt to let go of bedrail once upright. She consistently reaching out to hold onto therapist for improved security. Scooted fwd with use of bed pad.    Transfers Overall transfer level: Needs assistance Equipment used: Rolling Noguera (2 wheels) Transfers: Sit to/from Stand, Bed to chair/wheelchair/BSC Sit to Stand: Mod assist, +2 physical assistance, Max assist Stand pivot transfers: Mod assist, Max assist, +2 physical assistance         General transfer comment: Pt demonstrated proper hand placement using RW. Powered up with modA x2. Transferred to [facility] on her left. Assist to maneuver RW while pivoting. Stood with maxA x1 for pericare to be addressed by Therapy Tech. Transferred to recliner chair on her left. Pt beginning to fatiguing requiring greater assistance. Facilitated pivot turns at hips guiding pt to chair.    Ambulation/Gait Ambulation/Gait assistance: Mod assist, Max assist, +2 physical assistance Gait Distance (Feet): 2 Feet Assistive device: Rolling Svec (2 wheels) Gait Pattern/deviations: Step-to pattern, Decreased step length - right, Decreased step length - left Gait velocity: decreased     General Gait Details: Pt maintained RLE TDWB during transfers. She took short slow steps primarily pivoting on LLE. Step-by-step cues for sequencing. Assist to manuever RW. Facilitation of pt's hips to turn as she fatigued.   Stairs             Wheelchair Mobility     Tilt Bed    Modified Rankin (Stroke Patients Only)       Balance Overall balance assessment: Needs assistance Sitting-balance support:  Feet supported, Bilateral upper extremity supported Sitting  balance-Leahy Scale: Fair Sitting balance - Comments: Pt maintains BUE support at all times d/t fear of falling. CGA for safety.   Standing balance support: Bilateral upper extremity supported, During functional activity, Reliant on assistive device for balance Standing balance-Leahy Scale: Poor Standing balance comment: Pt dependent on RW and +2 assist.                            Communication Communication Communication: Impaired Factors Affecting Communication: Hearing impaired  Cognition Arousal: Alert Behavior During Therapy: WFL for tasks assessed/performed   PT - Cognitive impairments: No apparent impairments                         Following commands: Intact      Cueing Cueing Techniques: Verbal cues, Tactile cues, Visual cues  Exercises      General Comments General comments (skin integrity, edema, etc.): VSS on 2L O2 via [place]. Pt bleeding through dressings at RLE incision sites. RN/MD aware.      Pertinent Vitals/Pain Pain Assessment Pain Assessment: Faces Faces Pain Scale: Hurts even more Pain Location: R hip Pain Descriptors / Indicators: Operative site guarding, Grimacing, Discomfort, Aching Pain Intervention(s): Premedicated before session, Limited activity within patient's tolerance, Monitored during session, Repositioned    Home Living                          Prior Function            PT Goals (current goals can now be found in the care plan section) Acute Rehab PT Goals Patient Stated Goal: Go to rehab Progress towards PT goals: Progressing toward goals    Frequency    Min 2X/week      PT Plan      Co-evaluation              AM-PAC PT 6 Clicks Mobility   Outcome Measure  Help needed turning from your back to your side while in a flat bed without using bedrails?: A Little Help needed moving from lying on your back to sitting on the side of a flat bed without using bedrails?: A Little Help needed  moving to and from a bed to a chair (including a wheelchair)?: Total Help needed standing up from a chair using your arms (e.g., wheelchair or bedside chair)?: Total Help needed to walk in hospital room?: Total Help needed climbing 3-5 steps with a railing? : Total 6 Click Score: 10    End of Session Equipment Utilized During Treatment: Gait belt Activity Tolerance: Patient tolerated treatment well;Patient limited by fatigue Patient left: in chair;with call bell/phone within reach;with chair alarm set;with family/visitor present Nurse Communication: Mobility status;Need for lift equipment (stedy) PT Visit Diagnosis: Pain;Difficulty in walking, not elsewhere classified (R26.2);Other abnormalities of gait and mobility (R26.89);Unsteadiness on feet (R26.81) Pain - Right/Left: Right Pain - part of body: Hip     Time: 8575-8550 PT Time Calculation (min) (ACUTE ONLY): 25 min  Charges:    $Therapeutic Activity: 23-37 mins PT General Charges $$ ACUTE PT VISIT: 1 Visit                     Randall SAUNDERS, PT, DPT Acute Rehabilitation Services Office: [phone] Secure Chat Preferred  Delon CHRISTELLA Callander 02/08/2024, 4:21 PM

## 2024-02-08 NOTE — Plan of Care (Signed)
  Problem: Education: Goal: Knowledge of General Education information will improve Description: Including pain rating scale, medication(s)/side effects and non-pharmacologic comfort measures Outcome: Progressing   Problem: Clinical Measurements: Goal: Ability to maintain clinical measurements within normal limits will improve Outcome: Progressing Goal: Will remain free from infection Outcome: Progressing Goal: Diagnostic test results will improve Outcome: Progressing Goal: Respiratory complications will improve Outcome: Progressing Goal: Cardiovascular complication will be avoided Outcome: Progressing   Problem: Activity: Goal: Risk for activity intolerance will decrease Outcome: Progressing   Problem: Pain Managment: Goal: General experience of comfort will improve and/or be controlled Outcome: Progressing

## 2024-02-08 NOTE — TOC Progression Note (Addendum)
 Transition of Care [facility]) - Progression Note    Patient Details  Name: [name] MRN: [id number] Date of Birth: [birth year]  [facility] CM/SW Contact  Bridget Cordella Simmonds, LCSW Phone Number: 02/08/2024, 10:28 AM  Clinical Narrative:   PASSR docs uploaded.    1540: PASSR received: [phone] E  Expected Discharge Plan: Skilled Nursing Facility Barriers to Discharge: Continued Medical Work up, SNF Pending bed offer               Expected Discharge Plan and Services In-house Referral: Clinical Social Work   Post Acute Care Choice: Skilled Nursing Facility Living arrangements for the past 2 months: Single Family Home                                       Social Drivers of Health (SDOH) Interventions SDOH Screenings   Food Insecurity: No Food Insecurity (02/07/2024)  Housing: Low Risk  (02/07/2024)  Transportation Needs: No Transportation Needs (02/07/2024)  Utilities: Not At Risk (02/07/2024)  Depression (PHQ2-9): Low Risk  (12/21/2023)  Social Connections: Unknown (02/07/2024)  Tobacco Use: Low Risk  (02/06/2024)    Readmission Risk Interventions     No data to display

## 2024-02-08 NOTE — Progress Notes (Signed)
 PROGRESS NOTE    [name]  FMW:[id number] DOB: 1933/10/05 DOA: 02/06/2024 PCP: Shepard Ade, MD   Brief Narrative:  This 89 yrs old female with PMH significant for anxiety, osteoarthritis, colon polyps, CAD, dilated cardiomyopathy, chronic systolic CHF, GERD, hiatal hernia, hyperlipidemia, hypertension, hypothyroidism, and MGUS, osteoporosis, peripheral neuropathy, history of pneumonia, restless leg syndrome, history of nonhemorrhagic CVA, history of TIAs, TMJ click, varicose veins, depression, detrusor instability,  vertebral fracture x 3 who had a mechanical fall at home in her bathroom injuring her right knee resulting in the area having deformity, pain and inability to bear weight on her right knee.  Right knee x-ray show an oblique periprosthetic fracture of the distal right femur. CT of the right femur show a comminuted, periprosthetic fracture of the distal right femur.  Patient was admitted for further evaluation.  Orthopedics is consulted.  Assessment & Plan:   Principal Problem:   Closed fracture of right distal femur (HCC) Active Problems:   Osteoporosis   CAD (coronary artery disease)   Hyperlipidemia   Essential hypertension   Heart failure with mildly reduced ejection fraction (HFmrEF) (HCC)   Macrocytic anemia   Hyponatremia   Anxiety disorder   Insomnia  Closed fracture of right distal femur [facility]): Patient presented status post mechanical fall at home. Xray Right Knee > shows an oblique periprosthetic fracture of the distal right femur. Continue Icing to the area. Buck's traction per protocol. Analgesics as needed. Antiemetics as needed. Orthopedic consulted.  Status post ORIF.  POD #2. Continue to use Hull and ambulate with assistance. Recommend Eliquis  2.5 mg twice daily for 30 days at discharge. PT/OT recommended SNF.  Osteoporosis: Continue calcium  and vitamin D . Would benefit from further treatment. Follow-up with primary care provider.   CAD  (coronary artery disease): Continue ASA, beta-blocker and statin.   Hyperlipidemia: Continue rosuvastatin  20 mg p.o. daily.   Essential hypertension: Heart failure with mildly reduced ejection fraction (HFmrEF) (HCC) Continue metoprolol  succinate 50 mg p.o. daily. Hold ARB and spironolactone .   Acute blood loss anemia: Likely postoperative.  Hemoglobin dropped 7.9->6.4. Transfuse 1 unit PRBC.  Monitor H&H, No signs of any obvious visible bleeding  Hyponatremia: Minimal.Unknown significance. Monitor electrolytes.  Anxiety disorder Continue Lexapro  10 mg p.o. twice daily.   Insomnia: Continue Ambien  at a reduced dose over insomnia.   DVT prophylaxis: (Lovenox ) Code Status: Full code Family Communication: No family at bed side. Disposition Plan:    Status is: Inpatient Remains inpatient appropriate because: Status post ORIF.  POD # 2.  PT and OT for SNF placement    Consultants:  Orthopeadics  Procedures: ORIF  Antimicrobials: Anti-infectives (From admission, onward)    Start     Dose/Rate Route Frequency Ordered Stop   02/06/24 1430  ceFAZolin  (ANCEF ) IVPB 2g/100 mL premix        2 g 200 mL/hr over 30 Minutes Intravenous  Once 02/06/24 1420 02/06/24 1500   02/06/24 1403  ceFAZolin  (ANCEF ) 2-4 GM/100ML-% IVPB       Note to Pharmacy: [name] M: cabinet override      02/06/24 1403 02/06/24 1507      Subjective: Patient was seen and examined at bedside. Overnight events noted. Patient is status post ORIF POD # 2.  She reports feeling better, pain is controlled. She is getting blood transfusion because of dropping hemoglobin.  Objective: Vitals:   02/08/24 0500 02/08/24 0700 02/08/24 1148 02/08/24 1416  BP: (!) 110/59 (P) 115/65 98/71 (!) 138/90  Pulse: ROLLEN)  115 (!) (P) 111 100 86  Resp:   18 14  Temp: 97.7 F (36.5 C) (P) 97.6 F (36.4 C) 98.1 F (36.7 C) 98 F (36.7 C)  TempSrc: Oral (P) Oral Oral Oral  SpO2: 95% (P) 94% 100% 100%  Weight:       Height:        Intake/Output Summary (Last 24 hours) at 02/08/2024 1524 Last data filed at 02/08/2024 1156 Gross per 24 hour  Intake 240 ml  Output --  Net 240 ml   Filed Weights   02/07/24 1500  Weight: 54.3 kg    Examination:  General exam: Appears calm and comfortable, deconditioned, not in any acute distress. Respiratory system: CTA Bilaterally. Respiratory effort normal. RR 14 Cardiovascular system: S1 & S2 heard, RRR. No JVD, murmurs, rubs, gallops or clicks Gastrointestinal system: Abdomen is non distended, soft and non tender. Normal bowel sounds heard. Central nervous system: Alert and oriented x 3 . No focal neurological deficits. Extremities: Right hip status post ORIF.  POD #2. Skin: No rashes, lesions or ulcers Psychiatry: Judgement and insight appear normal. Mood & affect appropriate.     Data Reviewed: I have personally reviewed following labs and imaging studies  CBC: Recent Labs  Lab 02/06/24 0443 02/06/24 2044 02/07/24 0438 02/07/24 1420 02/08/24 0527  WBC 13.8* 10.6* 7.9  --  7.7  NEUTROABS 11.2*  --   --   --   --   HGB 10.1* 8.7* 7.9* 7.9* 6.4*  HCT 32.7* 26.8* 24.6* 24.6* 19.8*  MCV 103.8* 101.9* 102.9*  --  103.7*  PLT 331 308 263  --  233   Basic Metabolic Panel: Recent Labs  Lab 02/06/24 0443 02/06/24 2044 02/07/24 0438 02/08/24 0527  NA 133*  --  131* 130*  K 4.1  --  4.2 4.3  CL 99  --  98 97*  CO2 23  --  24 25  GLUCOSE 131*  --  145* 151*  BUN 28*  --  29* 31*  CREATININE 0.78 0.76 0.88 0.93  CALCIUM  9.8  --  8.7* 8.8*  MG 2.1  --   --  1.9  PHOS 3.3  --   --  3.9   GFR: Estimated Creatinine Clearance: 30.6 mL/min (by C-G formula based on SCr of 0.93 mg/dL). Liver Function Tests: Recent Labs  Lab 02/07/24 0438  AST 137*  ALT 150*  ALKPHOS 56  BILITOT 0.9  PROT 5.0*  ALBUMIN 3.0*   No results for input(s): LIPASE, AMYLASE in the last 168 hours. No results for input(s): AMMONIA in the last 168  hours. Coagulation Profile: No results for input(s): INR, PROTIME in the last 168 hours. Cardiac Enzymes: No results for input(s): CKTOTAL, CKMB, CKMBINDEX, TROPONINI in the last 168 hours. BNP (last 3 results) No results for input(s): PROBNP in the last 8760 hours. HbA1C: No results for input(s): HGBA1C in the last 72 hours. CBG: No results for input(s): GLUCAP in the last 168 hours. Lipid Profile: No results for input(s): CHOL, HDL, LDLCALC, TRIG, CHOLHDL, LDLDIRECT in the last 72 hours. Thyroid  Function Tests: No results for input(s): TSH, T4TOTAL, FREET4, T3FREE, THYROIDAB in the last 72 hours. Anemia Panel: No results for input(s): VITAMINB12, FOLATE, FERRITIN, TIBC, IRON, RETICCTPCT in the last 72 hours. Sepsis Labs: No results for input(s): PROCALCITON, LATICACIDVEN in the last 168 hours.  No results found for this or any previous visit (from the past 240 hours).  Radiology Studies: DG Knee Right Port Result Date:  02/06/2024 CLINICAL DATA:  Fracture repair EXAM: PORTABLE RIGHT KNEE - 1-2 VIEW COMPARISON:  02/06/2024 FINDINGS: Right knee replacement. Interval lateral plate and screw fixation of the distal femoral periprosthetic fracture with residual 1/4 shaft diameter medial and anterior displacement of distal fracture fragment. Gas in the soft tissues consistent with recent surgery IMPRESSION: Interval plate and screw fixation of distal femoral periprosthetic fracture. Electronically Signed   By: Luke Bun M.D.   On: 02/06/2024 19:48   DG FEMUR, MIN 2 VIEWS RIGHT Result Date: 02/06/2024 CLINICAL DATA:  461500 Elective surgery 461500. EXAM: RIGHT FEMUR 2 VIEWS COMPARISON:  None Available. FINDINGS: Multiple intraoperative spot radiographs are submitted for review. Knee arthroplasty is noted. There is spiral fracture of the distal femur. The subsequent images, there is ORIF with plate and screws. Please refer to the separately  issued operative report for further details. Fluoroscopy time: 79.4 seconds Radiation dose: 7.26 mGy IMPRESSION: *Intraoperative support. Electronically Signed   By: Ree Molt M.D.   On: 02/06/2024 17:57   DG C-Arm 1-60 Min-No Report Result Date: 02/06/2024 Fluoroscopy was utilized by the requesting physician.  No radiographic interpretation.   DG C-Arm 1-60 Min-No Report Result Date: 02/06/2024 Fluoroscopy was utilized by the requesting physician.  No radiographic interpretation.   Scheduled Meds:  aspirin  EC  81 mg Oral Daily   docusate sodium   100 mg Oral BID   enoxaparin  (LOVENOX ) injection  40 mg Subcutaneous Q24H   escitalopram   10 mg Oral BID   levothyroxine   25 mcg Oral Q0600   metoprolol  succinate  75 mg Oral Daily   pantoprazole   40 mg Oral Daily   pyridoxine   500 mg Oral Daily   rosuvastatin   20 mg Oral Daily   spironolactone   25 mg Oral Daily   Continuous Infusions:   LOS: 2 days    Time spent: 35 mins    Darcel Dawley, MD [facility]   If 7PM-7AM, please contact night-coverage

## 2024-02-08 NOTE — Progress Notes (Signed)
 Orthopaedic Trauma Service Progress Note  Patient ID: [name] MRN: [id number] DOB/AGE: [birth year] 87 y.o.  Subjective:  Doing fair Tired  Transferred to chair yesterday   Getting 2 units PRBCs today   No CP or SOB  ROS As above  Today's  total administered Morphine  Milligram Equivalents: 7.5 Yesterday's total administered Morphine  Milligram Equivalents: 32.5  Objective:   VITALS:   Vitals:   02/07/24 1558 02/07/24 2200 02/08/24 0500 02/08/24 0700  BP: (!) 82/53 117/67 (!) 110/59 (P) 115/65  Pulse: 100 100 (!) 115 (!) (P) 111  Resp: 15     Temp: 97.6 F (36.4 C) 98.1 F (36.7 C) 97.7 F (36.5 C) (P) 97.6 F (36.4 C)  TempSrc:  Oral Oral (P) Oral  SpO2: 97% 100% 95% (P) 94%  Weight:      Height:        Estimated body mass index is 23.4 kg/m as calculated from the following:   Height as of this encounter: 5' (1.524 m).   Weight as of this encounter: 54.3 kg.   Intake/Output      09/10 0701 09/11 0700 09/11 0701 09/12 0700   I.V. (mL/kg)     IV Piggyback     Total Intake(mL/kg)     Urine (mL/kg/hr) 250 (0.2)    Blood     Total Output 250    Net -250         Urine Occurrence 3 x      LABS  Results for orders placed or performed during the hospital encounter of 02/06/24 (from the past 24 hours)  Hemoglobin and hematocrit, blood     Status: Abnormal   Collection Time: 02/07/24  2:20 PM  Result Value Ref Range   Hemoglobin 7.9 (L) 12.0 - 15.0 g/dL   HCT 75.3 (L) 63.9 - 53.9 %  CBC     Status: Abnormal   Collection Time: 02/08/24  5:27 AM  Result Value Ref Range   WBC 7.7 4.0 - 10.5 K/uL   RBC 1.91 (L) 3.87 - 5.11 MIL/uL   Hemoglobin 6.4 (LL) 12.0 - 15.0 g/dL   HCT 80.1 (L) 63.9 - 53.9 %   MCV 103.7 (H) 80.0 - 100.0 fL   MCH 33.5 26.0 - 34.0 pg   MCHC 32.3 30.0 - 36.0 g/dL   RDW 85.8 88.4 - 84.4 %   Platelets 233 150 - 400 K/uL   nRBC 0.0 0.0 - 0.2 %  Magnesium      Status: None   Collection Time: 02/08/24  5:27 AM  Result Value Ref Range   Magnesium 1.9 1.7 - 2.4 mg/dL  Basic metabolic panel with GFR     Status: Abnormal   Collection Time: 02/08/24  5:27 AM  Result Value Ref Range   Sodium 130 (L) 135 - 145 mmol/L   Potassium 4.3 3.5 - 5.1 mmol/L   Chloride 97 (L) 98 - 111 mmol/L   CO2 25 22 - 32 mmol/L   Glucose, Bld 151 (H) 70 - 99 mg/dL   BUN 31 (H) 8 - 23 mg/dL   Creatinine, Ser 9.06 0.44 - 1.00 mg/dL   Calcium  8.8 (L) 8.9 - 10.3 mg/dL   GFR, Estimated 59 (L) >60 mL/min   Anion gap 8 5 - 15  Phosphorus  Status: None   Collection Time: 02/08/24  5:27 AM  Result Value Ref Range   Phosphorus 3.9 2.5 - 4.6 mg/dL  Type and screen [name]     Status: None (Preliminary result)   Collection Time: 02/08/24  7:57 AM  Result Value Ref Range   ABO/RH(D) O NEG    Antibody Screen NEG    Sample Expiration 02/11/2024,2359    Unit Number [id number]    Blood Component Type RED CELLS,LR    Unit division 00    Status of Unit ALLOCATED    Transfusion Status OK TO TRANSFUSE    Crossmatch Result      Compatible Performed at [facility], [address], [place], [place] [address]   Prepare RBC (crossmatch)     Status: None   Collection Time: 02/08/24  8:15 AM  Result Value Ref Range   Order Confirmation      ORDER PROCESSED BY BLOOD BANK Performed at [facility], [address], [place], [place] [address]      PHYSICAL EXAM:   Gen: Lying in bed, no acute distress,  Lungs: Unlabored Ext:       Right Lower Extremity Dressings have some scant punctate bloody drainage but otherwise clean  Dressing change  incisions are clean  No signs of infection             Mild chronic soft tissue changes noted distally consistent with PVD             Extremity is warm             No DCT             Compartments are soft             No pain out of proportion with passive stretching of toes or ankle              DPN, SPN, TN sensory functions are intact             EHL, FHL, lesser toe motor functions intact             Ankle flexion, extension, inversion eversion intact             + DP pulse  Assessment/Plan: 2 Days Post-Op   Principal Problem:   Closed fracture of right distal femur (HCC) Active Problems:   Osteoporosis   CAD (coronary artery disease)   Hyperlipidemia   Essential hypertension   Heart failure with mildly reduced ejection fraction (HFmrEF) (HCC)   Macrocytic anemia   Hyponatremia   Anxiety disorder   Insomnia   Anti-infectives (From admission, onward)    Start     Dose/Rate Route Frequency Ordered Stop   02/06/24 1430  ceFAZolin  (ANCEF ) IVPB 2g/100 mL premix        2 g 200 mL/hr over 30 Minutes Intravenous  Once 02/06/24 1420 02/06/24 1500   02/06/24 1403  ceFAZolin  (ANCEF ) 2-4 GM/100ML-% IVPB       Note to Pharmacy: [name] M: cabinet override      02/06/24 1403 02/06/24 1507     .  POD/HD#: 58  89 year old female ground-level fall, [name] dependent with right periprosthetic distal femur fracture   - Ground-level fall   -Right periprosthetic distal femur fracture s/p ORIF Weightbearing Touchdown weightbearing right leg for approximately 4 weeks then advance as tolerated.  Continue to use Coupe and ambulate with assistance  ROM/Activity                         Unrestricted range of motion of right knee and hip                         Activity as tolerated while maintaining weightbearing restrictions as noted above                           Therapy evaluations                           Ice and elevate for swelling and pain control                         Do not let knee rest in flexion to prevent contracture.  Can use a pillow or the bone foam             Wound care                         Dressing changed today   Change as needed.  Okay to clean incisions with soap and water  only   Okay to leave wounds open to the air once there  is no drainage   - Pain management:             Minimize narcotics   - ABL anemia/Hemodynamics             Transfusion today   - Medical issues              Per primary   - DVT/PE prophylaxis:             Lovenox , SCDs             Recommend Eliquis  2.5 mg p.o. twice daily for 30 days at discharge   - ID:              Perioperative antibiotics   - Metabolic Bone Disease:             Fracture is indicative of a fragility fracture             Labs show hypervitaminosis D                         Sees vitamin D  supplements             Continue to monitor   - Activity:             As above   - FEN/GI prophylaxis/Foley/Lines:             Diet as tolerated   - Impediments to fracture healing:             Age/fragility fracture              - Dispo:             Therapy evaluations             TOC consult for SNF  Follow-up with orthopedics in 2 weeks   Francis MICAEL Mt, PA-C [phone] (C) 02/08/2024, 9:31 AM  [facility] [address] [place] [address] [phone] MAXIMINO MILLING (F)    After 5pm and on the weekends please log on to Amion,  go to orthopaedics and the look under the [facility] Call for the provider(s) on call. You can also call our office at [phone] and then follow the prompts to be connected to the call team.  Patient ID: Winton VEAR Finder, female   DOB: 16-Oct-1933, 89 y.o.   MRN: [id number]

## 2024-02-09 ENCOUNTER — Inpatient Hospital Stay (HOSPITAL_COMMUNITY)

## 2024-02-09 ENCOUNTER — Encounter (HOSPITAL_COMMUNITY): Payer: Self-pay | Admitting: Orthopedic Surgery

## 2024-02-09 DIAGNOSIS — S72401A Unspecified fracture of lower end of right femur, initial encounter for closed fracture: Secondary | ICD-10-CM | POA: Diagnosis not present

## 2024-02-09 LAB — BASIC METABOLIC PANEL WITH GFR
Anion gap: 12 (ref 5–15)
BUN: 21 mg/dL (ref 8–23)
CO2: 24 mmol/L (ref 22–32)
Calcium: 9.1 mg/dL (ref 8.9–10.3)
Chloride: 99 mmol/L (ref 98–111)
Creatinine, Ser: 0.6 mg/dL (ref 0.44–1.00)
GFR, Estimated: >60 mL/min (ref >60)
Glucose, Bld: 116 mg/dL — ABNORMAL HIGH (ref 70–99)
Potassium: 4.9 mmol/L (ref 3.5–5.1)
Sodium: 135 mmol/L (ref 135–145)

## 2024-02-09 LAB — CBC
HCT: 24.4 % — ABNORMAL LOW (ref 36.0–46.0)
Hemoglobin: 8.2 g/dL — ABNORMAL LOW (ref 12.0–15.0)
MCH: 33.7 pg (ref 26.0–34.0)
MCHC: 33.6 g/dL (ref 30.0–36.0)
MCV: 100.4 fL — ABNORMAL HIGH (ref 80.0–100.0)
Platelets: 228 K/uL (ref 150–400)
RBC: 2.43 MIL/uL — ABNORMAL LOW (ref 3.87–5.11)
RDW: 15 % (ref 11.5–15.5)
WBC: 6.6 K/uL (ref 4.0–10.5)
nRBC: 0 % (ref 0.0–0.2)

## 2024-02-09 LAB — HEMOGLOBIN AND HEMATOCRIT, BLOOD
HCT: 27.2 % — ABNORMAL LOW (ref 36.0–46.0)
HCT: 23.1 % — ABNORMAL LOW (ref 36.0–46.0)
Hemoglobin: 9.1 g/dL — ABNORMAL LOW (ref 12.0–15.0)
Hemoglobin: 7.7 g/dL — ABNORMAL LOW (ref 12.0–15.0)

## 2024-02-09 LAB — PHOSPHORUS: Phosphorus: 2.6 mg/dL (ref 2.5–4.6)

## 2024-02-09 LAB — PREPARE RBC (CROSSMATCH)

## 2024-02-09 LAB — MAGNESIUM: Magnesium: 1.9 mg/dL (ref 1.7–2.4)

## 2024-02-09 MED ORDER — SENNOSIDES-DOCUSATE SODIUM 8.6-50 MG PO TABS
1.0000 | ORAL_TABLET | Freq: Two times a day (BID) | ORAL | Status: DC | PRN
  Administered 2024-02-09: 1 via ORAL
  Filled 2024-02-09 (×2): qty 1

## 2024-02-09 NOTE — Progress Notes (Signed)
 Occupational Therapy Treatment Patient Details Name: [name] MRN: [id number] DOB: March 07, 1934 Today's Date: 02/09/2024   History of present illness [name] is an 89 y.o. female admitted 02/06/24 following mechanical fall at home in her bathroom sustaining a comminuted right distal femur supracondylar fracture. Pt s/p ORIF 9/9. PMHx: anxiety, OA, colon polyps, CAD, dilated cardiomyopathy, chronic systolic CHF, GERD, hiatal hernia, HLD, HTN, hypothyroidism, MGUS, osteoporosis, peripheral neuropathy, restless leg syndrome, nonhemorrhagic CVA, TIAs, detrusor instability, and vertebral fracture.   OT comments  Pt. Seen for skilled OT treatment session.  Pt. Declines eob/oob today stating pain with RLE and difficulty with WBS.  Max encouragement but pt. Cont. To decline attempts for use of bsc and wanted to use the bed pan.  CNA assisting as pt. Requries 2 person assist.  Pt. Able to roll L/R for bed pan placement and removal with MAX A X2.  UB/LB bathing bed level with MINA for reaching full buttocks area in sidelying along with back.  Cont. With acute OT POC.  Agree with d/c recommendations for <3hr/day for continued therapies.        If plan is discharge home, recommend the following:  Two people to help with walking and/or transfers;Two people to help with bathing/dressing/bathroom;Assistance with cooking/housework;Assist for transportation;Help with stairs or ramp for entrance   Equipment Recommendations  BSC/3in1    Recommendations for Other Services      Precautions / Restrictions Precautions Precautions: Fall Restrictions RLE Weight Bearing Per Provider Order: Touchdown weight bearing       Mobility Bed Mobility Overal bed mobility: Needs Assistance Bed Mobility: Rolling Rolling: Max assist, +2 for physical assistance         General bed mobility comments: cna present and assisted with rolling pt. L/R for peri care and bed pan use.  pt. with heavy reliance on use of  bed rails for pulling.  increased anxiety and c/o pain with movement of RLE. prefers guiding above/aroudn hip and lower portion close to ankle not touching any of the middle portion of RLE.    Transfers                         Balance                                           ADL either performed or assessed with clinical judgement   ADL Overall ADL's : Needs assistance/impaired         Upper Body Bathing: Set up;Bed level   Lower Body Bathing: Minimal assistance;Bed level Lower Body Bathing Details (indicate cue type and reason): pt. able to reach front areas with set up, max a x2 to roll L and pt. able to reach buttocks in sidelying for washing         Toilet Transfer: +2 for physical assistance;Maximal assistance (bed level, used bed pan) Toilet Transfer Details (indicate cue type and reason): pt. requesting bed pan, encouraged transfer to bsc with assistance from CNA that was present.  pt. cont. to decline  i cant put weight on it' reviewed it was tdwb and we would help her but she was adamant for bed pan use           General ADL Comments: pt. declined oob/eob today. 2 person assistance available but pt. wanting bed pan secondary to wbs and stating pain  was a major factor    Extremity/Trunk Water quality scientist     Communication Communication Communication: Impaired Factors Affecting Communication: Hearing impaired   Cognition Arousal: Alert Behavior During Therapy: WFL for tasks assessed/performed Cognition: No family/caregiver present to determine baseline, Cognition impaired     Awareness: Intellectual awareness intact, Online awareness intact       OT - Cognition Comments: Oriented x4 but initially requiring increased time for processing and limited by anxiety/fear  when RLE touched or moved                 Following commands: Intact        Cueing   Cueing Techniques:  Verbal cues, Tactile cues, Visual cues  Exercises      Shoulder Instructions       General Comments      Pertinent Vitals/ Pain       Pain Assessment Pain Assessment: Faces Faces Pain Scale: Hurts even more Pain Location: R hip Pain Descriptors / Indicators: Operative site guarding, Grimacing, Discomfort, Aching Pain Intervention(s): Repositioned, Monitored during session, Limited activity within patient's tolerance  Home Living                                          Prior Functioning/Environment              Frequency  Min 2X/week        Progress Toward Goals  OT Goals(current goals can now be found in the care plan section)  Progress towards OT goals: Progressing toward goals     Plan      Co-evaluation                 AM-PAC OT 6 Clicks Daily Activity     Outcome Measure   Help from another person eating meals?: A Little Help from another person taking care of personal grooming?: A Little Help from another person toileting, which includes using toliet, bedpan, or urinal?: Total Help from another person bathing (including washing, rinsing, drying)?: A Lot Help from another person to put on and taking off regular upper body clothing?: A Little Help from another person to put on and taking off regular lower body clothing?: A Lot 6 Click Score: 14    End of Session    OT Visit Diagnosis: Other abnormalities of gait and mobility (R26.89);Unsteadiness on feet (R26.81);History of falling (Z91.81);Muscle weakness (generalized) (M62.81);Repeated falls (R29.6);Pain   Activity Tolerance Patient tolerated treatment well;Patient limited by pain   Patient Left in bed;with call bell/phone within reach;with bed alarm set   Nurse Communication Other (comment) (alerted rn pt. with blood on R buttocks area and pt. stating it hurts)        Time: 1050-1109 OT Time Calculation (min): 19 min  Charges: OT General Charges $OT Visit: 1  Visit OT Treatments $Self Care/Home Management : 8-22 mins  Randall, COTA/L Acute Rehabilitation [phone]   CHRISTELLA Nest Lorraine-COTA/L  02/09/2024, 11:30 AM

## 2024-02-09 NOTE — TOC Progression Note (Signed)
 Transition of Care [facility]) - Progression Note    Patient Details  Name: [name] MRN: [id number] Date of Birth: Aug 18, 1933  [facility]) CM/SW Contact  Bridget Cordella Simmonds, LCSW Phone Number: 02/09/2024, 1:14 PM  Clinical Narrative:   Per MD, not stable for DC today.  [facility] updated: they are not able to receive pt over the weekend.   Son [name] updated.     Expected Discharge Plan: Skilled Nursing Facility Barriers to Discharge: Continued Medical Work up, SNF Pending bed offer               Expected Discharge Plan and Services In-house Referral: Clinical Social Work   Post Acute Care Choice: Skilled Nursing Facility Living arrangements for the past 2 months: Single Family Home                                       Social Drivers of Health (SDOH) Interventions SDOH Screenings   Food Insecurity: No Food Insecurity (02/07/2024)  Housing: Low Risk  (02/07/2024)  Transportation Needs: No Transportation Needs (02/07/2024)  Utilities: Not At Risk (02/07/2024)  Depression (PHQ2-9): Low Risk  (12/21/2023)  Social Connections: Unknown (02/07/2024)  Tobacco Use: Low Risk  (02/06/2024)    Readmission Risk Interventions     No data to display

## 2024-02-09 NOTE — Progress Notes (Signed)
 PROGRESS NOTE    [name]  FMW:[id number] DOB: [birth year] DOA: 02/06/2024 PCP: Shepard Ade, MD   Brief Narrative:  This 89 yrs old female with PMH significant for anxiety, osteoarthritis, colon polyps, CAD, dilated cardiomyopathy, chronic systolic CHF, GERD, hiatal hernia, hyperlipidemia, hypertension, hypothyroidism, and MGUS, osteoporosis, peripheral neuropathy, history of pneumonia, restless leg syndrome, history of nonhemorrhagic CVA, history of TIAs, TMJ click, varicose veins, depression, detrusor instability,  vertebral fracture x 3 who had a mechanical fall at home in her bathroom injuring her right knee resulting in the area having deformity, pain and inability to bear weight on her right knee.  Right knee x-ray show an oblique periprosthetic fracture of the distal right femur. CT of the right femur show a comminuted, periprosthetic fracture of the distal right femur.  Patient was admitted for further evaluation.  Orthopedics is consulted.  Assessment & Plan:   Principal Problem:   Closed fracture of right distal femur (HCC) Active Problems:   Osteoporosis   CAD (coronary artery disease)   Hyperlipidemia   Essential hypertension   Heart failure with mildly reduced ejection fraction (HFmrEF) (HCC)   Macrocytic anemia   Hyponatremia   Anxiety disorder   Insomnia  Closed fracture of right distal femur [facility]): Patient presented status post mechanical fall at home. Xray Right Knee > shows an oblique periprosthetic fracture of the distal right femur. Continue Icing to the area. Buck's traction per protocol. Analgesics as needed. Antiemetics as needed. Orthopedic consulted.  Status post ORIF.  POD # 3. Continue to use Langer and ambulate with assistance. Recommend Eliquis  2.5 mg twice daily for 30 days at discharge. PT/OT recommended SNF.  Osteoporosis: Continue calcium  and vitamin D . Would benefit from further treatment. Follow-up with primary care provider.   CAD  (coronary artery disease): Continue ASA, beta-blocker and statin.   Hyperlipidemia: Continue rosuvastatin  20 mg p.o. daily.   Essential hypertension: Heart failure with mildly reduced ejection fraction (HFmrEF) (HCC) Continue metoprolol  succinate 50 mg p.o. daily. Hold ARB and spironolactone .   Acute blood loss anemia: Likely postoperative.  Hemoglobin dropped 7.9->6.4. Transfuse 1 unit PRBC.  Hemoglobin improved to 9.1 then 8.2 > 7.7 Right leg is more swollen then left leg, CT ruled out hematoma. Transfuse 1 unit PRBC.  Monitor H&H. Check stool for occult blood.  Hyponatremia: Minimal.Unknown significance. Sodium normalized.  Anxiety disorder Continue Lexapro  10 mg p.o. twice daily.   Insomnia: Continue Ambien  at a reduced dose over insomnia.   DVT prophylaxis: (Lovenox ) Code Status: Full code Family Communication: No family at bed side. Disposition Plan:    Status is: Inpatient Remains inpatient appropriate because: Status post ORIF.  POD # 3.  PT and OT for SNF placement    Consultants:  Orthopeadics  Procedures: ORIF  Antimicrobials: Anti-infectives (From admission, onward)    Start     Dose/Rate Route Frequency Ordered Stop   02/06/24 1430  ceFAZolin  (ANCEF ) IVPB 2g/100 mL premix        2 g 200 mL/hr over 30 Minutes Intravenous  Once 02/06/24 1420 02/06/24 1500   02/06/24 1403  ceFAZolin  (ANCEF ) 2-4 GM/100ML-% IVPB       Note to Pharmacy: [name] M: cabinet override      02/06/24 1403 02/06/24 1507      Subjective: Patient was seen and examined at bedside. Overnight events noted. Patient is status post ORIF POD # 3.  She reports feeling better, pain is controlled. Hemoglobin dropped again, right leg is more swollen.  CT  ruled out hematoma  Objective: Vitals:   02/08/24 1549 02/08/24 2100 02/09/24 0530 02/09/24 0855  BP: 130/69 (!) 133/91 135/62 129/63  Pulse: 90 93 90 96  Resp: 16 18 18 15   Temp: 97.8 F (36.6 C) (!) 97.5 F (36.4 C)  97.8 F (36.6 C) 98.9 F (37.2 C)  TempSrc: Oral Oral Oral Oral  SpO2: 100% 100% 97% 96%  Weight:      Height:        Intake/Output Summary (Last 24 hours) at 02/09/2024 1454 Last data filed at 02/08/2024 2000 Gross per 24 hour  Intake 120 ml  Output --  Net 120 ml   Filed Weights   02/07/24 1500  Weight: 54.3 kg    Examination:  General exam: Appears calm and comfortable, deconditioned, not in any acute distress. Respiratory system: CTA Bilaterally. Respiratory effort normal. RR 14 Cardiovascular system: S1 & S2 heard, RRR. No JVD, murmurs, rubs, gallops or clicks Gastrointestinal system: Abdomen is non distended, soft and non tender. Normal bowel sounds heard. Central nervous system: Alert and oriented x 3 . No focal neurological deficits. Extremities: Right hip status post ORIF.  POD # 3.  Right leg swelling. Skin: No rashes, lesions or ulcers Psychiatry: Judgement and insight appear normal. Mood & affect appropriate.     Data Reviewed: I have personally reviewed following labs and imaging studies  CBC: Recent Labs  Lab 02/06/24 0443 02/06/24 2044 02/07/24 0438 02/07/24 1420 02/08/24 0527 02/08/24 1832 02/09/24 0532 02/09/24 0828  WBC 13.8* 10.6* 7.9  --  7.7  --  6.6  --   NEUTROABS 11.2*  --   --   --   --   --   --   --   HGB 10.1* 8.7* 7.9* 7.9* 6.4* 9.1* 8.2* 7.7*  HCT 32.7* 26.8* 24.6* 24.6* 19.8* 27.2* 24.4* 23.1*  MCV 103.8* 101.9* 102.9*  --  103.7*  --  100.4*  --   PLT 331 308 263  --  233  --  228  --    Basic Metabolic Panel: Recent Labs  Lab 02/06/24 0443 02/06/24 2044 02/07/24 0438 02/08/24 0527 02/09/24 0532  NA 133*  --  131* 130* 135  K 4.1  --  4.2 4.3 4.9  CL 99  --  98 97* 99  CO2 23  --  24 25 24   GLUCOSE 131*  --  145* 151* 116*  BUN 28*  --  29* 31* 21  CREATININE 0.78 0.76 0.88 0.93 0.60  CALCIUM  9.8  --  8.7* 8.8* 9.1  MG 2.1  --   --  1.9 1.9  PHOS 3.3  --   --  3.9 2.6   GFR: Estimated Creatinine Clearance: 34.9  mL/min (by C-G formula based on SCr of 0.6 mg/dL). Liver Function Tests: Recent Labs  Lab 02/07/24 0438  AST 137*  ALT 150*  ALKPHOS 56  BILITOT 0.9  PROT 5.0*  ALBUMIN 3.0*   No results for input(s): LIPASE, AMYLASE in the last 168 hours. No results for input(s): AMMONIA in the last 168 hours. Coagulation Profile: No results for input(s): INR, PROTIME in the last 168 hours. Cardiac Enzymes: No results for input(s): CKTOTAL, CKMB, CKMBINDEX, TROPONINI in the last 168 hours. BNP (last 3 results) No results for input(s): PROBNP in the last 8760 hours. HbA1C: No results for input(s): HGBA1C in the last 72 hours. CBG: No results for input(s): GLUCAP in the last 168 hours. Lipid Profile: No results for input(s): CHOL, HDL,  LDLCALC, TRIG, CHOLHDL, LDLDIRECT in the last 72 hours. Thyroid  Function Tests: No results for input(s): TSH, T4TOTAL, FREET4, T3FREE, THYROIDAB in the last 72 hours. Anemia Panel: No results for input(s): VITAMINB12, FOLATE, FERRITIN, TIBC, IRON, RETICCTPCT in the last 72 hours. Sepsis Labs: No results for input(s): PROCALCITON, LATICACIDVEN in the last 168 hours.  No results found for this or any previous visit (from the past 240 hours).  Radiology Studies: CT FEMUR RIGHT WO CONTRAST Result Date: 02/09/2024 CLINICAL DATA:  Limping, right leg swelling EXAM: CT OF THE LOWER RIGHT EXTREMITY WITHOUT CONTRAST TECHNIQUE: Multidetector CT imaging of the right lower extremity was performed according to the standard protocol. RADIATION DOSE REDUCTION: This exam was performed according to the departmental dose-optimization program which includes automated exposure control, adjustment of the mA and/or kV according to patient size and/or use of iterative reconstruction technique. COMPARISON:  Femur intraoperative spot images 02/06/2024 FINDINGS: Bones/Joint/Cartilage Bilateral total knee prosthesis observed. Spiral  fracture of the distal femur with intermediary fragment is traversed by a lateral plate and screw fixator with FLAIR distal margin. The plate and screw fixator attaches the dominant proximal fragment and dominant distal fragment, with the 10.4 cm anterior fragment not affixed, and anterior displaced by up to 1 cm. This anterior fragment distal margin does extend to the proximal lip of the femoral component of the prosthesis anteriorly, as shown on the 02/06/2024 CT scan. Additional periprosthetic fracturing along the total knee prosthesis is not well seen, although cortical margins are indistinct due to metal artifact despite the use of metal artifact reduction processing. Chondrocalcinosis in both hips. Ligaments Suboptimally assessed by CT. Muscles and Tendons Expected indistinctness of fascia planes around the fracture site. Soft tissues Subcutaneous edema laterally along the fracture site. There is likewise subcutaneous edema lateral to the right hip. Small amount of gas and fluid density in the subcutaneous tissues of the right buttock IMPRESSION: 1. Spiral fracture of the distal femur, traversed by a lateral plate and screw fixator. The plate and screw fixator attaches the dominant proximal fragment and dominant distal fragment, with the 10.4 cm anterior intermediary fragment not affixed, and anterior displaced by up to 1 cm. This anterior fragment distal margin does extend to the proximal lip of the femoral component of the prosthesis anteriorly, as shown on the 02/06/2024 CT scan. 2. Additional periprosthetic fracturing along the total knee prosthesis is not shown, although cortical margins are indistinct due to metal artifact despite the use of metal artifact reduction processing. 3. Chondrocalcinosis in both hips. 4. Small amount of gas and fluid density in the subcutaneous tissues of the right buttock. Electronically Signed   By: Ryan Salvage M.D.   On: 02/09/2024 12:35   Scheduled Meds:  aspirin  EC   81 mg Oral Daily   docusate sodium   100 mg Oral BID   enoxaparin  (LOVENOX ) injection  40 mg Subcutaneous Q24H   escitalopram   10 mg Oral BID   levothyroxine   25 mcg Oral Q0600   metoprolol  succinate  75 mg Oral Daily   pantoprazole   40 mg Oral Daily   pyridoxine   500 mg Oral Daily   rosuvastatin   20 mg Oral Daily   spironolactone   25 mg Oral Daily   Continuous Infusions:   LOS: 3 days    Time spent: 50 mins    Darcel Dawley, MD [facility]   If 7PM-7AM, please contact night-coverage

## 2024-02-09 NOTE — Care Management Important Message (Signed)
 Important Message  Patient Details  Name: [name] MRN: [id number] Date of Birth: June 21, 1933   Important Message Given:  Yes - Medicare IM     Jon Cruel 02/09/2024, 1:14 PM

## 2024-02-09 NOTE — Plan of Care (Signed)
   Problem: Education: Goal: Knowledge of General Education information will improve Description: Including pain rating scale, medication(s)/side effects and non-pharmacologic comfort measures Outcome: Progressing   Problem: Clinical Measurements: Goal: Ability to maintain clinical measurements within normal limits will improve Outcome: Progressing   Problem: Nutrition: Goal: Adequate nutrition will be maintained Outcome: Progressing   Problem: Elimination: Goal: Will not experience complications related to bowel motility Outcome: Progressing Goal: Will not experience complications related to urinary retention Outcome: Progressing   Problem: Pain Managment: Goal: General experience of comfort will improve and/or be controlled Outcome: Progressing   Problem: Safety: Goal: Ability to remain free from injury will improve Outcome: Progressing   Problem: Skin Integrity: Goal: Risk for impaired skin integrity will decrease Outcome: Progressing

## 2024-02-10 DIAGNOSIS — S72401A Unspecified fracture of lower end of right femur, initial encounter for closed fracture: Secondary | ICD-10-CM | POA: Diagnosis not present

## 2024-02-10 LAB — URINALYSIS, ROUTINE W REFLEX MICROSCOPIC
Bilirubin Urine: NEGATIVE
Glucose, UA: NEGATIVE mg/dL
Hgb urine dipstick: NEGATIVE
Ketones, ur: NEGATIVE mg/dL
Leukocytes,Ua: NEGATIVE
Nitrite: NEGATIVE
Protein, ur: NEGATIVE mg/dL
Specific Gravity, Urine: 1.013 (ref 1.005–1.030)
pH: 6 (ref 5.0–8.0)

## 2024-02-10 LAB — TYPE AND SCREEN
ABO/RH(D): O NEG
Antibody Screen: NEGATIVE
Unit division: 0
Unit division: 0

## 2024-02-10 LAB — BPAM RBC
Blood Product Expiration Date: 202509182359
Blood Product Expiration Date: 202509282359
ISSUE DATE / TIME: 202509111131
ISSUE DATE / TIME: 202509121524
Unit Type and Rh: 9500
Unit Type and Rh: 9500

## 2024-02-10 LAB — HEMOGLOBIN AND HEMATOCRIT, BLOOD
HCT: 27.8 % — ABNORMAL LOW (ref 36.0–46.0)
Hemoglobin: 9.6 g/dL — ABNORMAL LOW (ref 12.0–15.0)

## 2024-02-10 NOTE — Plan of Care (Signed)

## 2024-02-10 NOTE — Progress Notes (Signed)
 PROGRESS NOTE    [name]  FMW:[id number] DOB: 01-Mar-1934 DOA: 02/06/2024 PCP: Shepard Ade, MD   Brief Narrative:  This 89 yrs old female with PMH significant for anxiety, osteoarthritis, colon polyps, CAD, dilated cardiomyopathy, chronic systolic CHF, GERD, hiatal hernia, hyperlipidemia, hypertension, hypothyroidism, and MGUS, osteoporosis, peripheral neuropathy, history of pneumonia, restless leg syndrome, history of nonhemorrhagic CVA, history of TIAs, TMJ click, varicose veins, depression, detrusor instability,  vertebral fracture x 3 who had a mechanical fall at home in her bathroom injuring her right knee resulting in the area having deformity, pain and inability to bear weight on her right knee.  Right knee x-ray show an oblique periprosthetic fracture of the distal right femur. CT of the right femur show a comminuted, periprosthetic fracture of the distal right femur.  Patient was admitted for further evaluation.  Orthopedics is consulted.  Assessment & Plan:   Principal Problem:   Closed fracture of right distal femur (HCC) Active Problems:   Osteoporosis   CAD (coronary artery disease)   Hyperlipidemia   Essential hypertension   Heart failure with mildly reduced ejection fraction (HFmrEF) (HCC)   Macrocytic anemia   Hyponatremia   Anxiety disorder   Insomnia  Closed fracture of right distal femur [facility]): Patient presented status post mechanical fall at home. Xray Right Knee > shows an oblique periprosthetic fracture of the distal right femur. Continue Icing to the area. Buck's traction per protocol. Analgesics as needed. Antiemetics as needed. Orthopedic consulted.  Status post ORIF.  POD # 4. Continue to use Chatterjee and ambulate with assistance. Recommend Eliquis  2.5 mg twice daily for 30 days at discharge. PT/OT recommended SNF.  Osteoporosis: Continue calcium  and vitamin D . Would benefit from further treatment. Follow-up with primary care provider.   CAD  (coronary artery disease): Continue ASA, beta-blocker and statin.   Hyperlipidemia: Continue rosuvastatin  20 mg p.o. daily.   Essential hypertension: Heart failure with mildly reduced ejection fraction (HFmrEF) (HCC) Continue metoprolol  succinate 50 mg p.o. daily. Hold ARB and spironolactone .   Acute blood loss anemia: Likely postoperative.  Hemoglobin dropped 7.9 ->6.4. Transfuse 1 unit PRBC.  Hemoglobin improved to 9.1 then 8.2 > 7.7 Right leg is more swollen then left leg, CT ruled out hematoma. Transfuse 1 unit PRBC.  Today her H&H is 9.6. Check stool for occult blood.  Hyponatremia: Minimal.Unknown significance. Sodium normalized.  Anxiety disorder Continue Lexapro  10 mg p.o. twice daily.   Insomnia: Continue Ambien  at a reduced dose over insomnia.   DVT prophylaxis: (Lovenox ) Code Status: Full code Family Communication: No family at bed side. Disposition Plan:    Status is: Inpatient Remains inpatient appropriate because: Status post ORIF.  POD # 3.  PT and OT for SNF placement    Consultants:  Orthopeadics  Procedures: ORIF  Antimicrobials: Anti-infectives (From admission, onward)    Start     Dose/Rate Route Frequency Ordered Stop   02/06/24 1430  ceFAZolin  (ANCEF ) IVPB 2g/100 mL premix        2 g 200 mL/hr over 30 Minutes Intravenous  Once 02/06/24 1420 02/06/24 1500   02/06/24 1403  ceFAZolin  (ANCEF ) 2-4 GM/100ML-% IVPB       Note to Pharmacy: [name] M: cabinet override      02/06/24 1403 02/06/24 1507      Subjective: Patient was seen and examined at bedside. Overnight events noted. Patient is status post ORIF POD # 4.  She reports feeling better, pain is controlled. Hemoglobin dropped again, right leg is  more swollen.  CT ruled out hematoma. H&H improved after 2 units PRBC.  Patient reports pain is controlled  Objective: Vitals:   02/09/24 1541 02/09/24 1548 02/09/24 1929 02/10/24 1202  BP:  130/77 (!) 147/90 111/66  Pulse:  89  84 84  Resp: 12 17 18 16   Temp:  98.3 F (36.8 C) 98.4 F (36.9 C) 98 F (36.7 C)  TempSrc:  Oral  Oral  SpO2:  98% 100% 99%  Weight:      Height:        Intake/Output Summary (Last 24 hours) at 02/10/2024 1225 Last data filed at 02/09/2024 1945 Gross per 24 hour  Intake 315 ml  Output --  Net 315 ml   Filed Weights   02/07/24 1500  Weight: 54.3 kg    Examination:  General exam: Appears calm and comfortable, deconditioned, not in any acute distress. Respiratory system: CTA Bilaterally. Respiratory effort normal. RR 14 Cardiovascular system: S1 & S2 heard, RRR. No JVD, murmurs, rubs, gallops or clicks Gastrointestinal system: Abdomen is non distended, soft and non tender. Normal bowel sounds heard. Central nervous system: Alert and oriented x 3 . No focal neurological deficits. Extremities: Right hip status post ORIF.  POD # 4.  Right leg swelling. Skin: No rashes, lesions or ulcers Psychiatry: Judgement and insight appear normal. Mood & affect appropriate.    Data Reviewed: I have personally reviewed following labs and imaging studies  CBC: Recent Labs  Lab 02/06/24 0443 02/06/24 2044 02/07/24 0438 02/07/24 1420 02/08/24 0527 02/08/24 1832 02/09/24 0532 02/09/24 0828 02/10/24 0441  WBC 13.8* 10.6* 7.9  --  7.7  --  6.6  --   --   NEUTROABS 11.2*  --   --   --   --   --   --   --   --   HGB 10.1* 8.7* 7.9*   < > 6.4* 9.1* 8.2* 7.7* 9.6*  HCT 32.7* 26.8* 24.6*   < > 19.8* 27.2* 24.4* 23.1* 27.8*  MCV 103.8* 101.9* 102.9*  --  103.7*  --  100.4*  --   --   PLT 331 308 263  --  233  --  228  --   --    < > = values in this interval not displayed.   Basic Metabolic Panel: Recent Labs  Lab 02/06/24 0443 02/06/24 2044 02/07/24 0438 02/08/24 0527 02/09/24 0532  NA 133*  --  131* 130* 135  K 4.1  --  4.2 4.3 4.9  CL 99  --  98 97* 99  CO2 23  --  24 25 24   GLUCOSE 131*  --  145* 151* 116*  BUN 28*  --  29* 31* 21  CREATININE 0.78 0.76 0.88 0.93 0.60   CALCIUM  9.8  --  8.7* 8.8* 9.1  MG 2.1  --   --  1.9 1.9  PHOS 3.3  --   --  3.9 2.6   GFR: Estimated Creatinine Clearance: 34.9 mL/min (by C-G formula based on SCr of 0.6 mg/dL). Liver Function Tests: Recent Labs  Lab 02/07/24 0438  AST 137*  ALT 150*  ALKPHOS 56  BILITOT 0.9  PROT 5.0*  ALBUMIN 3.0*   No results for input(s): LIPASE, AMYLASE in the last 168 hours. No results for input(s): AMMONIA in the last 168 hours. Coagulation Profile: No results for input(s): INR, PROTIME in the last 168 hours. Cardiac Enzymes: No results for input(s): CKTOTAL, CKMB, CKMBINDEX, TROPONINI in the last 168 hours.  BNP (last 3 results) No results for input(s): PROBNP in the last 8760 hours. HbA1C: No results for input(s): HGBA1C in the last 72 hours. CBG: No results for input(s): GLUCAP in the last 168 hours. Lipid Profile: No results for input(s): CHOL, HDL, LDLCALC, TRIG, CHOLHDL, LDLDIRECT in the last 72 hours. Thyroid  Function Tests: No results for input(s): TSH, T4TOTAL, FREET4, T3FREE, THYROIDAB in the last 72 hours. Anemia Panel: No results for input(s): VITAMINB12, FOLATE, FERRITIN, TIBC, IRON, RETICCTPCT in the last 72 hours. Sepsis Labs: No results for input(s): PROCALCITON, LATICACIDVEN in the last 168 hours.  No results found for this or any previous visit (from the past 240 hours).  Radiology Studies: CT FEMUR RIGHT WO CONTRAST Result Date: 02/09/2024 CLINICAL DATA:  Limping, right leg swelling EXAM: CT OF THE LOWER RIGHT EXTREMITY WITHOUT CONTRAST TECHNIQUE: Multidetector CT imaging of the right lower extremity was performed according to the standard protocol. RADIATION DOSE REDUCTION: This exam was performed according to the departmental dose-optimization program which includes automated exposure control, adjustment of the mA and/or kV according to patient size and/or use of iterative reconstruction technique.  COMPARISON:  Femur intraoperative spot images 02/06/2024 FINDINGS: Bones/Joint/Cartilage Bilateral total knee prosthesis observed. Spiral fracture of the distal femur with intermediary fragment is traversed by a lateral plate and screw fixator with FLAIR distal margin. The plate and screw fixator attaches the dominant proximal fragment and dominant distal fragment, with the 10.4 cm anterior fragment not affixed, and anterior displaced by up to 1 cm. This anterior fragment distal margin does extend to the proximal lip of the femoral component of the prosthesis anteriorly, as shown on the 02/06/2024 CT scan. Additional periprosthetic fracturing along the total knee prosthesis is not well seen, although cortical margins are indistinct due to metal artifact despite the use of metal artifact reduction processing. Chondrocalcinosis in both hips. Ligaments Suboptimally assessed by CT. Muscles and Tendons Expected indistinctness of fascia planes around the fracture site. Soft tissues Subcutaneous edema laterally along the fracture site. There is likewise subcutaneous edema lateral to the right hip. Small amount of gas and fluid density in the subcutaneous tissues of the right buttock IMPRESSION: 1. Spiral fracture of the distal femur, traversed by a lateral plate and screw fixator. The plate and screw fixator attaches the dominant proximal fragment and dominant distal fragment, with the 10.4 cm anterior intermediary fragment not affixed, and anterior displaced by up to 1 cm. This anterior fragment distal margin does extend to the proximal lip of the femoral component of the prosthesis anteriorly, as shown on the 02/06/2024 CT scan. 2. Additional periprosthetic fracturing along the total knee prosthesis is not shown, although cortical margins are indistinct due to metal artifact despite the use of metal artifact reduction processing. 3. Chondrocalcinosis in both hips. 4. Small amount of gas and fluid density in the subcutaneous  tissues of the right buttock. Electronically Signed   By: Ryan Salvage M.D.   On: 02/09/2024 12:35   Scheduled Meds:  aspirin  EC  81 mg Oral Daily   docusate sodium   100 mg Oral BID   enoxaparin  (LOVENOX ) injection  40 mg Subcutaneous Q24H   escitalopram   10 mg Oral BID   levothyroxine   25 mcg Oral Q0600   metoprolol  succinate  75 mg Oral Daily   pantoprazole   40 mg Oral Daily   pyridoxine   500 mg Oral Daily   rosuvastatin   20 mg Oral Daily   spironolactone   25 mg Oral Daily   Continuous Infusions:  LOS: 4 days    Time spent: 35 mins    Darcel Dawley, MD [facility]   If 7PM-7AM, please contact night-coverage

## 2024-02-11 DIAGNOSIS — S72401A Unspecified fracture of lower end of right femur, initial encounter for closed fracture: Secondary | ICD-10-CM | POA: Diagnosis not present

## 2024-02-11 LAB — HEMOGLOBIN AND HEMATOCRIT, BLOOD
HCT: 29.9 % — ABNORMAL LOW (ref 36.0–46.0)
Hemoglobin: 10.3 g/dL — ABNORMAL LOW (ref 12.0–15.0)

## 2024-02-11 MED ORDER — HYDRALAZINE HCL 25 MG PO TABS
25.0000 mg | ORAL_TABLET | Freq: Three times a day (TID) | ORAL | Status: DC
  Administered 2024-02-11 – 2024-02-13 (×7): 25 mg via ORAL
  Filled 2024-02-11 (×7): qty 1

## 2024-02-11 NOTE — Progress Notes (Signed)
 PROGRESS NOTE    [name]  FMW:[id number] DOB: Aug 06, 1933 DOA: 02/06/2024 PCP: Shepard Ade, MD   Brief Narrative:  This 89 yrs old female with PMH significant for anxiety, osteoarthritis, colon polyps, CAD, dilated cardiomyopathy, chronic systolic CHF, GERD, hiatal hernia, hyperlipidemia, hypertension, hypothyroidism, and MGUS, osteoporosis, peripheral neuropathy, history of pneumonia, restless leg syndrome, history of nonhemorrhagic CVA, history of TIAs, TMJ click, varicose veins, depression, detrusor instability,  vertebral fracture x 3 who had a mechanical fall at home in her bathroom injuring her right knee resulting in the area having deformity, pain and inability to bear weight on her right knee.  Right knee x-ray show an oblique periprosthetic fracture of the distal right femur. CT of the right femur show a comminuted, periprosthetic fracture of the distal right femur.  Patient was admitted for further evaluation.  Orthopedics is consulted.  Assessment & Plan:   Principal Problem:   Closed fracture of right distal femur (HCC) Active Problems:   Osteoporosis   CAD (coronary artery disease)   Hyperlipidemia   Essential hypertension   Heart failure with mildly reduced ejection fraction (HFmrEF) (HCC)   Macrocytic anemia   Hyponatremia   Anxiety disorder   Insomnia  Closed fracture of right distal femur [facility]): Patient presented status post mechanical fall at home. Xray Right Knee > shows an oblique periprosthetic fracture of the distal right femur. Continue Icing to the area. Buck's traction per protocol. Analgesics as needed. Antiemetics as needed. Orthopedic consulted.  Status post ORIF.  POD # 5. Continue to use [place] and ambulate with assistance. Recommend Eliquis  2.5 mg twice daily for 30 days at discharge. PT/OT recommended SNF.  Osteoporosis: Continue calcium  and vitamin D . Would benefit from further treatment. Follow-up with primary care provider.   CAD  (coronary artery disease): Continue ASA, beta-blocker and statin.   Hyperlipidemia: Continue rosuvastatin  20 mg p.o. daily.   Essential hypertension: Heart failure with mildly reduced ejection fraction (HFmrEF) (HCC) Continue metoprolol  succinate 50 mg p.o. daily. Hold ARB and spironolactone .   Acute blood loss anemia: Likely postoperative.  Hemoglobin dropped 7.9 ->6.4. Transfuse 1 unit PRBC.  Hemoglobin improved to 9.1 then 8.2 > 7.7 Right leg is more swollen then left leg, CT ruled out hematoma. Transfuse 1 unit PRBC.  Today her H&H is 9.6.>10.0  Hyponatremia: Minimal.Unknown significance. Sodium Improved.  Anxiety disorder Continue Lexapro  10 mg p.o. twice daily.   Insomnia: Continue Ambien  at a reduced dose over insomnia.   DVT prophylaxis: (Lovenox ) Code Status: Full code Family Communication: No family at bed side. Disposition Plan:    Status is: Inpatient Remains inpatient appropriate because: Status post ORIF.  POD # 3.  PT and OT for SNF placement    Consultants:  Orthopeadics  Procedures: ORIF  Antimicrobials: Anti-infectives (From admission, onward)    Start     Dose/Rate Route Frequency Ordered Stop   02/06/24 1430  ceFAZolin  (ANCEF ) IVPB 2g/100 mL premix        2 g 200 mL/hr over 30 Minutes Intravenous  Once 02/06/24 1420 02/06/24 1500   02/06/24 1403  ceFAZolin  (ANCEF ) 2-4 GM/100ML-% IVPB       Note to Pharmacy: [name] M: cabinet override      02/06/24 1403 02/06/24 1507      Subjective: Patient was seen and examined at bedside. Overnight events noted. Patient is status post ORIF POD # 5.  She reports feeling better, pain is controlled. Her Hb has improved.  Objective: Vitals:   02/10/24 1559  02/10/24 2015 02/11/24 0406 02/11/24 0742  BP: (!) 146/86 (!) 177/87 (!) 192/95 (!) 174/85  Pulse: 83 84 88 84  Resp: 17  16 15   Temp: 98.5 F (36.9 C) 98.6 F (37 C) 98.1 F (36.7 C) 98.3 F (36.8 C)  TempSrc: Oral Oral Oral    SpO2: 99% 94% 98% 95%  Weight:      Height:        Intake/Output Summary (Last 24 hours) at 02/11/2024 1248 Last data filed at 02/10/2024 1800 Gross per 24 hour  Intake 240 ml  Output 190 ml  Net 50 ml   Filed Weights   02/07/24 1500  Weight: 54.3 kg    Examination:  General exam: Appears calm and comfortable, deconditioned, not in any acute distress. Respiratory system: CTA Bilaterally. Respiratory effort normal. RR 14 Cardiovascular system: S1 & S2 heard, RRR. No JVD, murmurs, rubs, gallops or clicks Gastrointestinal system: Abdomen is non distended, soft and non tender. Normal bowel sounds heard. Central nervous system: Alert and oriented x 3 . No focal neurological deficits. Extremities: Right hip status post ORIF.  POD # 5.  Right leg swelling. Skin: No rashes, lesions or ulcers Psychiatry: Judgement and insight appear normal. Mood & affect appropriate.    Data Reviewed: I have personally reviewed following labs and imaging studies  CBC: Recent Labs  Lab 02/06/24 0443 02/06/24 2044 02/07/24 0438 02/07/24 1420 02/08/24 0527 02/08/24 1832 02/09/24 0532 02/09/24 0828 02/10/24 0441 02/11/24 0357  WBC 13.8* 10.6* 7.9  --  7.7  --  6.6  --   --   --   NEUTROABS 11.2*  --   --   --   --   --   --   --   --   --   HGB 10.1* 8.7* 7.9*   < > 6.4* 9.1* 8.2* 7.7* 9.6* 10.3*  HCT 32.7* 26.8* 24.6*   < > 19.8* 27.2* 24.4* 23.1* 27.8* 29.9*  MCV 103.8* 101.9* 102.9*  --  103.7*  --  100.4*  --   --   --   PLT 331 308 263  --  233  --  228  --   --   --    < > = values in this interval not displayed.   Basic Metabolic Panel: Recent Labs  Lab 02/06/24 0443 02/06/24 2044 02/07/24 0438 02/08/24 0527 02/09/24 0532  NA 133*  --  131* 130* 135  K 4.1  --  4.2 4.3 4.9  CL 99  --  98 97* 99  CO2 23  --  24 25 24   GLUCOSE 131*  --  145* 151* 116*  BUN 28*  --  29* 31* 21  CREATININE 0.78 0.76 0.88 0.93 0.60  CALCIUM  9.8  --  8.7* 8.8* 9.1  MG 2.1  --   --  1.9 1.9   PHOS 3.3  --   --  3.9 2.6   GFR: Estimated Creatinine Clearance: 34.9 mL/min (by C-G formula based on SCr of 0.6 mg/dL). Liver Function Tests: Recent Labs  Lab 02/07/24 0438  AST 137*  ALT 150*  ALKPHOS 56  BILITOT 0.9  PROT 5.0*  ALBUMIN 3.0*   No results for input(s): LIPASE, AMYLASE in the last 168 hours. No results for input(s): AMMONIA in the last 168 hours. Coagulation Profile: No results for input(s): INR, PROTIME in the last 168 hours. Cardiac Enzymes: No results for input(s): CKTOTAL, CKMB, CKMBINDEX, TROPONINI in the last 168 hours. BNP (last 3  results) No results for input(s): PROBNP in the last 8760 hours. HbA1C: No results for input(s): HGBA1C in the last 72 hours. CBG: No results for input(s): GLUCAP in the last 168 hours. Lipid Profile: No results for input(s): CHOL, HDL, LDLCALC, TRIG, CHOLHDL, LDLDIRECT in the last 72 hours. Thyroid  Function Tests: No results for input(s): TSH, T4TOTAL, FREET4, T3FREE, THYROIDAB in the last 72 hours. Anemia Panel: No results for input(s): VITAMINB12, FOLATE, FERRITIN, TIBC, IRON, RETICCTPCT in the last 72 hours. Sepsis Labs: No results for input(s): PROCALCITON, LATICACIDVEN in the last 168 hours.  No results found for this or any previous visit (from the past 240 hours).  Radiology Studies: No results found.  Scheduled Meds:  aspirin  EC  81 mg Oral Daily   docusate sodium   100 mg Oral BID   enoxaparin  (LOVENOX ) injection  40 mg Subcutaneous Q24H   escitalopram   10 mg Oral BID   hydrALAZINE   25 mg Oral Q8H   levothyroxine   25 mcg Oral Q0600   metoprolol  succinate  75 mg Oral Daily   pantoprazole   40 mg Oral Daily   pyridoxine   500 mg Oral Daily   rosuvastatin   20 mg Oral Daily   spironolactone   25 mg Oral Daily   Continuous Infusions:   LOS: 5 days    Time spent: 35 mins    Darcel Dawley, MD [facility]   If 7PM-7AM, please  contact night-coverage

## 2024-02-12 DIAGNOSIS — S72401A Unspecified fracture of lower end of right femur, initial encounter for closed fracture: Secondary | ICD-10-CM | POA: Diagnosis not present

## 2024-02-12 NOTE — Progress Notes (Signed)
 PROGRESS NOTE    [name]  FMW:[id number] DOB: 12/09/1933 DOA: 02/06/2024 PCP: Shepard Ade, MD   Brief Narrative:  This 89 yrs old female with PMH significant for anxiety, osteoarthritis, colon polyps, CAD, dilated cardiomyopathy, chronic systolic CHF, GERD, hiatal hernia, hyperlipidemia, hypertension, hypothyroidism, and MGUS, osteoporosis, peripheral neuropathy, history of pneumonia, restless leg syndrome, history of nonhemorrhagic CVA, history of TIAs, TMJ click, varicose veins, depression, detrusor instability,  vertebral fracture x 3 who had a mechanical fall at home in her bathroom injuring her right knee resulting in the area having deformity, pain and inability to bear weight on her right knee.  Right knee x-ray show an oblique periprosthetic fracture of the distal right femur. CT of the right femur show a comminuted, periprosthetic fracture of the distal right femur.  Patient was admitted for further evaluation.  Orthopedics is consulted.  Assessment & Plan:   Principal Problem:   Closed fracture of right distal femur (HCC) Active Problems:   Osteoporosis   CAD (coronary artery disease)   Hyperlipidemia   Essential hypertension   Heart failure with mildly reduced ejection fraction (HFmrEF) (HCC)   Macrocytic anemia   Hyponatremia   Anxiety disorder   Insomnia  Closed fracture of right distal femur [facility]): Patient presented status post mechanical fall at home. Xray Right Knee > shows an oblique periprosthetic fracture of the distal right femur. Analgesics as needed. Antiemetics as needed. Orthopedic consulted.  Status post ORIF.  POD # 5. Continue to use [place] and ambulate with assistance. Recommend Eliquis  2.5 mg twice daily for 30 days at discharge. PT/OT recommended SNF. Patient will be discharged to SNF tomorrow.  Osteoporosis: Continue calcium  and vitamin D . Would benefit from further treatment. Follow-up with primary care provider.   CAD (coronary  artery disease): Continue ASA, beta-blocker and statin.   Hyperlipidemia: Continue rosuvastatin  20 mg p.o. daily.   Essential hypertension: Heart failure with mildly reduced ejection fraction (HFmrEF) (HCC) Continue metoprolol  succinate 50 mg p.o. daily. Hold ARB and spironolactone .   Acute blood loss anemia: Likely postoperative.  Hemoglobin dropped 7.9 ->6.4. Transfuse 1 unit PRBC.  Hemoglobin improved to 9.1 then 8.2 > 7.7 Right leg is more swollen then left leg, CT ruled out hematoma. Transfuse 1 unit PRBC.  Today her H&H is 9.6.>10.0  Hyponatremia: Minimal.Unknown significance. Sodium Improved.  Anxiety disorder Continue Lexapro  10 mg p.o. twice daily.   Insomnia: Continue Ambien  at a reduced dose for insomnia.   DVT prophylaxis: (Lovenox ) Code Status: Full code Family Communication: No family at bed side. Disposition Plan:    Status is: Inpatient Remains inpatient appropriate because: Status post ORIF.  POD # 4.  PT and OT for SNF placement    Consultants:  Orthopeadics  Procedures: ORIF  Antimicrobials: Anti-infectives (From admission, onward)    Start     Dose/Rate Route Frequency Ordered Stop   02/06/24 1430  ceFAZolin  (ANCEF ) IVPB 2g/100 mL premix        2 g 200 mL/hr over 30 Minutes Intravenous  Once 02/06/24 1420 02/06/24 1500   02/06/24 1403  ceFAZolin  (ANCEF ) 2-4 GM/100ML-% IVPB       Note to Pharmacy: [name] M: cabinet override      02/06/24 1403 02/06/24 1507      Subjective: Patient was seen and examined at bedside. Overnight events noted. Patient is status post ORIF POD # 5.  She reports feeling better, pain is controlled. Her Hb has improved.  Objective: Vitals:   02/11/24 1554 02/11/24 2018  02/12/24 0635 02/12/24 0720  BP: (!) 158/94 135/74 (!) 153/106 (!) 151/77  Pulse: 82 82 83 93  Resp: 16 18 18 18   Temp: 98.3 F (36.8 C) 98.2 F (36.8 C) 98.1 F (36.7 C) 98.2 F (36.8 C)  TempSrc:  Oral Oral Oral  SpO2: 98% 98%  100% 97%  Weight:      Height:        Intake/Output Summary (Last 24 hours) at 02/12/2024 1436 Last data filed at 02/12/2024 0900 Gross per 24 hour  Intake 600 ml  Output --  Net 600 ml   Filed Weights   02/07/24 1500  Weight: 54.3 kg    Examination:  General exam: Appears calm and comfortable, deconditioned, not in any acute distress. Respiratory system: CTA Bilaterally. Respiratory effort normal. RR 15 Cardiovascular system: S1 & S2 heard, RRR. No JVD, murmurs, rubs, gallops or clicks Gastrointestinal system: Abdomen is non distended, soft and non tender. Normal bowel sounds heard. Central nervous system: Alert and oriented x 3 . No focal neurological deficits. Extremities: Right hip status post ORIF.  POD # 5.  Right leg swelling. Skin: No rashes, lesions or ulcers Psychiatry: Judgement and insight appear normal. Mood & affect appropriate.    Data Reviewed: I have personally reviewed following labs and imaging studies  CBC: Recent Labs  Lab 02/06/24 0443 02/06/24 2044 02/07/24 0438 02/07/24 1420 02/08/24 0527 02/08/24 1832 02/09/24 0532 02/09/24 0828 02/10/24 0441 02/11/24 0357  WBC 13.8* 10.6* 7.9  --  7.7  --  6.6  --   --   --   NEUTROABS 11.2*  --   --   --   --   --   --   --   --   --   HGB 10.1* 8.7* 7.9*   < > 6.4* 9.1* 8.2* 7.7* 9.6* 10.3*  HCT 32.7* 26.8* 24.6*   < > 19.8* 27.2* 24.4* 23.1* 27.8* 29.9*  MCV 103.8* 101.9* 102.9*  --  103.7*  --  100.4*  --   --   --   PLT 331 308 263  --  233  --  228  --   --   --    < > = values in this interval not displayed.   Basic Metabolic Panel: Recent Labs  Lab 02/06/24 0443 02/06/24 2044 02/07/24 0438 02/08/24 0527 02/09/24 0532  NA 133*  --  131* 130* 135  K 4.1  --  4.2 4.3 4.9  CL 99  --  98 97* 99  CO2 23  --  24 25 24   GLUCOSE 131*  --  145* 151* 116*  BUN 28*  --  29* 31* 21  CREATININE 0.78 0.76 0.88 0.93 0.60  CALCIUM  9.8  --  8.7* 8.8* 9.1  MG 2.1  --   --  1.9 1.9  PHOS 3.3  --   --   3.9 2.6   GFR: Estimated Creatinine Clearance: 34.9 mL/min (by C-G formula based on SCr of 0.6 mg/dL). Liver Function Tests: Recent Labs  Lab 02/07/24 0438  AST 137*  ALT 150*  ALKPHOS 56  BILITOT 0.9  PROT 5.0*  ALBUMIN 3.0*   No results for input(s): LIPASE, AMYLASE in the last 168 hours. No results for input(s): AMMONIA in the last 168 hours. Coagulation Profile: No results for input(s): INR, PROTIME in the last 168 hours. Cardiac Enzymes: No results for input(s): CKTOTAL, CKMB, CKMBINDEX, TROPONINI in the last 168 hours. BNP (last 3 results) No results for  input(s): PROBNP in the last 8760 hours. HbA1C: No results for input(s): HGBA1C in the last 72 hours. CBG: No results for input(s): GLUCAP in the last 168 hours. Lipid Profile: No results for input(s): CHOL, HDL, LDLCALC, TRIG, CHOLHDL, LDLDIRECT in the last 72 hours. Thyroid  Function Tests: No results for input(s): TSH, T4TOTAL, FREET4, T3FREE, THYROIDAB in the last 72 hours. Anemia Panel: No results for input(s): VITAMINB12, FOLATE, FERRITIN, TIBC, IRON, RETICCTPCT in the last 72 hours. Sepsis Labs: No results for input(s): PROCALCITON, LATICACIDVEN in the last 168 hours.  No results found for this or any previous visit (from the past 240 hours).  Radiology Studies: No results found.  Scheduled Meds:  aspirin  EC  81 mg Oral Daily   docusate sodium   100 mg Oral BID   enoxaparin  (LOVENOX ) injection  40 mg Subcutaneous Q24H   escitalopram   10 mg Oral BID   hydrALAZINE   25 mg Oral Q8H   levothyroxine   25 mcg Oral Q0600   metoprolol  succinate  75 mg Oral Daily   pantoprazole   40 mg Oral Daily   pyridoxine   500 mg Oral Daily   rosuvastatin   20 mg Oral Daily   spironolactone   25 mg Oral Daily   Continuous Infusions:   LOS: 6 days    Time spent: 35 mins    Darcel Dawley, MD [facility]   If 7PM-7AM, please contact night-coverage

## 2024-02-12 NOTE — TOC Progression Note (Signed)
 Transition of Care [facility]) - Progression Note    Patient Details  Name: [name] MRN: [id number] Date of Birth: Nov 02, 1933  [facility] CM/SW Contact  Bridget Cordella Simmonds, LCSW Phone Number: 02/12/2024, 10:10 AM  Clinical Narrative:   [facility]: due to staffing issues, unable to receive pt today.  Pt notified, son notified, MD notified.     Expected Discharge Plan: Skilled Nursing Facility Barriers to Discharge: Continued Medical Work up, SNF Pending bed offer               Expected Discharge Plan and Services In-house Referral: Clinical Social Work   Post Acute Care Choice: Skilled Nursing Facility Living arrangements for the past 2 months: Single Family Home                                       Social Drivers of Health (SDOH) Interventions SDOH Screenings   Food Insecurity: No Food Insecurity (02/07/2024)  Housing: Low Risk  (02/07/2024)  Transportation Needs: No Transportation Needs (02/07/2024)  Utilities: Not At Risk (02/07/2024)  Depression (PHQ2-9): Low Risk  (12/21/2023)  Social Connections: Unknown (02/07/2024)  Tobacco Use: Low Risk  (02/06/2024)    Readmission Risk Interventions     No data to display

## 2024-02-12 NOTE — Progress Notes (Signed)
 Physical Therapy Treatment  Patient Details Name: [name] MRN: [id number] DOB: [birth year] Today's Date: 02/12/2024   History of Present Illness [name] is an 89 y.o. female admitted 02/06/24 following mechanical fall at home in her bathroom sustaining a comminuted right distal femur supracondylar fracture. Pt s/p ORIF 9/9. PMHx: anxiety, OA, colon polyps, CAD, dilated cardiomyopathy, chronic systolic CHF, GERD, hiatal hernia, HLD, HTN, hypothyroidism, MGUS, osteoporosis, peripheral neuropathy, restless leg syndrome, nonhemorrhagic CVA, TIAs, detrusor instability, and vertebral fracture.    PT Comments  Focus of session was therapeutic exercise and repositioning in the bed.  Pt voices disappointment that she is not d/c to SNF rehab today. Is hopeful for tomorrow. She tolerated LE exercise program with AAROM for hip abd/add and SLR. Repositioned RLE on pillow at end of session as assisted to reposition trunk to eat lunch. Will continue to follow and progress as able per POC.    If plan is discharge home, recommend the following: Two people to help with walking and/or transfers;Two people to help with bathing/dressing/bathroom;Assistance with cooking/housework;Assist for transportation;Help with stairs or ramp for entrance   Can travel by private vehicle     No  Equipment Recommendations  Hospital bed;Hoyer lift;Wheelchair (measurements PT);Wheelchair cushion (measurements PT)    Recommendations for Other Services       Precautions / Restrictions Precautions Precautions: Fall Recall of Precautions/Restrictions: Impaired Precaution/Restrictions Comments: Fear of falling Restrictions Weight Bearing Restrictions Per Provider Order: Yes RLE Weight Bearing Per Provider Order: Touchdown weight bearing     Mobility  Bed Mobility                    Transfers                        Ambulation/Gait                   Stairs              Wheelchair Mobility     Tilt Bed    Modified Rankin (Stroke Patients Only)       Balance                                            Communication Communication Communication: Impaired Factors Affecting Communication: Hearing impaired  Cognition Arousal: Alert Behavior During Therapy: WFL for tasks assessed/performed   PT - Cognitive impairments: No apparent impairments                       PT - Cognition Comments: Pt A,Ox4. She intermittently closed her eyes and drifted off responding to tactile stimulation to maintain participation. Level of arousal approved upon sitting EOB. Pt nervous to mobilize d/t fear of falling. Following commands: Intact      Cueing Cueing Techniques: Verbal cues, Tactile cues, Visual cues  Exercises General Exercises - Lower Extremity Ankle Circles/Pumps: 10 reps, AROM Quad Sets: 10 reps Gluteal Sets: 10 reps Short Arc Quad: 10 reps, AROM Heel Slides: 10 reps, AROM Hip ABduction/ADduction: 10 reps, AAROM Straight Leg Raises: 10 reps, AAROM    General Comments        Pertinent Vitals/Pain Pain Assessment Pain Assessment: Faces Faces Pain Scale: Hurts even more Pain Location: R hip Pain Descriptors / Indicators: Operative site guarding, Grimacing, Discomfort, Aching Pain Intervention(s): Limited activity within  patient's tolerance, Monitored during session, Repositioned    Home Living                          Prior Function            PT Goals (current goals can now be found in the care plan section) Acute Rehab PT Goals Patient Stated Goal: be able to take a bath PT Goal Formulation: With patient Time For Goal Achievement: 02/21/24 Potential to Achieve Goals: Fair Progress towards PT goals: Progressing toward goals    Frequency    Min 2X/week      PT Plan      Co-evaluation              AM-PAC PT 6 Clicks Mobility   Outcome Measure  Help needed turning from  your back to your side while in a flat bed without using bedrails?: A Little Help needed moving from lying on your back to sitting on the side of a flat bed without using bedrails?: A Little Help needed moving to and from a bed to a chair (including a wheelchair)?: Total Help needed standing up from a chair using your arms (e.g., wheelchair or bedside chair)?: Total Help needed to walk in hospital room?: Total Help needed climbing 3-5 steps with a railing? : Total 6 Click Score: 10    End of Session Equipment Utilized During Treatment: Gait belt Activity Tolerance: Patient tolerated treatment well;Patient limited by fatigue Patient left: in chair;with call bell/phone within reach;with chair alarm set;with family/visitor present Nurse Communication: Mobility status;Need for lift equipment (stedy) PT Visit Diagnosis: Pain;Difficulty in walking, not elsewhere classified (R26.2);Other abnormalities of gait and mobility (R26.89);Unsteadiness on feet (R26.81) Pain - Right/Left: Right Pain - part of body: Hip     Time: 1210-1231 PT Time Calculation (min) (ACUTE ONLY): 21 min  Charges:    $Therapeutic Exercise: 8-22 mins PT General Charges $$ ACUTE PT VISIT: 1 Visit                     Leita Sable, PT, DPT Acute Rehabilitation Services Secure Chat Preferred Office: [phone]    Leita JONETTA Sable 02/12/2024, 1:06 PM

## 2024-02-12 NOTE — Plan of Care (Signed)

## 2024-02-13 DIAGNOSIS — S72401A Unspecified fracture of lower end of right femur, initial encounter for closed fracture: Secondary | ICD-10-CM | POA: Diagnosis not present

## 2024-02-13 LAB — CREATININE, SERUM
Creatinine, Ser: 0.66 mg/dL (ref 0.44–1.00)
GFR, Estimated: >60 mL/min (ref >60)

## 2024-02-13 NOTE — Plan of Care (Signed)
  Problem: Education: Goal: Knowledge of General Education information will improve Description: Including pain rating scale, medication(s)/side effects and non-pharmacologic comfort measures Outcome: Progressing   Problem: Clinical Measurements: Goal: Ability to maintain clinical measurements within normal limits will improve Outcome: Progressing Goal: Will remain free from infection Outcome: Progressing Goal: Respiratory complications will improve Outcome: Progressing Goal: Cardiovascular complication will be avoided Outcome: Progressing   Problem: Activity: Goal: Risk for activity intolerance will decrease Outcome: Not Progressing

## 2024-02-13 NOTE — Discharge Summary (Signed)
 Physician Discharge Summary  [name] FMW:[id number] DOB: [birth year] DOA: 02/06/2024  PCP: Shepard Ade, MD  Admit date: 02/06/2024  Discharge date: 02/13/2024  Admitted From: Home  Disposition:  SNF  Recommendations for Outpatient Follow-up:  Follow up with PCP in 1-2 weeks. Please obtain BMP/CBC in one week Advised to follow-up with orthopedics as scheduled. Advised to take Eliquis  2.5 mg twice daily for 28 days for DVT prophylaxis.  Home Health: None Equipment/Devices:Nelon  Discharge Condition: Stable CODE STATUS:Full code Diet recommendation: Heart Healthy   Brief Summary / Hospital Course: This 89 yrs old female with PMH significant for anxiety, osteoarthritis, colon polyps, CAD, dilated cardiomyopathy, chronic systolic CHF, GERD, hiatal hernia, hyperlipidemia, hypertension, hypothyroidism, and MGUS, osteoporosis, peripheral neuropathy, history of pneumonia, restless leg syndrome, history of nonhemorrhagic CVA, history of TIAs, TMJ click, varicose veins, depression, detrusor instability,  vertebral fracture x 3 who had a mechanical fall at home in her bathroom injuring her right knee resulting in the area having deformity, pain and inability to bear weight on her right knee.  Right knee x-ray show an oblique periprosthetic fracture of the distal right femur. CT of the right femur show a comminuted, periprosthetic fracture of the distal right femur.  Patient was admitted for further evaluation. Orthopedics is consulted.  Patient is status post ORIF.  POD # 6.  Patient has been doing well.  PT and OT recommended skilled nursing facility.  Orthopedics recommended Eliquis  2.5 mg twice daily for 30 days at discharge.  Hemoglobin dropped to 8.2 requiring 2 units of PRBC , hemoglobin remained stable afterwards.  Patient being discharged to SNF.  Discharge Diagnoses:  Principal Problem:   Closed fracture of right distal femur (HCC) Active Problems:   Osteoporosis   CAD (coronary  artery disease)   Hyperlipidemia   Essential hypertension   Heart failure with mildly reduced ejection fraction (HFmrEF) (HCC)   Macrocytic anemia   Hyponatremia   Anxiety disorder   Insomnia  Closed fracture of right distal femur [facility]): Patient presented status post mechanical fall at home. Xray Right Knee > shows an oblique periprosthetic fracture of the distal right femur. Analgesics as needed. Antiemetics as needed. Orthopedic consulted.  Status post ORIF.  POD # 5. Continue to use Hauk and ambulate with assistance. Recommend Eliquis  2.5 mg twice daily for 30 days at discharge. PT/OT recommended SNF.  Patient follow-up with orthopedics in 2 weeks.  Osteoporosis: Continue calcium  and vitamin D . Would benefit from further treatment. Follow-up with primary care provider.   CAD (coronary artery disease): Continue ASA, beta-blocker and statin.   Hyperlipidemia: Continue rosuvastatin  20 mg p.o. daily.   Essential hypertension: Heart failure with mildly reduced ejection fraction (HFmrEF) (HCC) Continue metoprolol  succinate 50 mg p.o. daily. Resume ARB and spironolactone .   Acute blood loss anemia: Likely postoperative.  Hemoglobin dropped 7.9 ->6.4. Transfuse 1 unit PRBC.  Hemoglobin improved to 9.1 then 8.2 > 7.7 Right leg is more swollen then left leg, CT ruled out hematoma. Transfuse 1 unit PRBC.  Today her H&H is 9.6.>10.0   Hyponatremia: Minimal.Unknown significance. Sodium Improved.   Anxiety disorder Continue Lexapro  10 mg p.o. twice daily.   Insomnia: Continue Ambien  at a reduced dose for insomnia.    Discharge Instructions  Discharge Instructions     Call MD for:  difficulty breathing, headache or visual disturbances   Complete by: As directed    Call MD for:  persistant dizziness or light-headedness   Complete by: As directed    Call  MD for:  persistant nausea and vomiting   Complete by: As directed    Diet - low sodium heart healthy   Complete  by: As directed    Diet general   Complete by: As directed    Discharge instructions   Complete by: As directed    Advised to follow-up with primary care physician in 1 week. Advised to follow-up with orthopedics as scheduled. Advised to take Eliquis  2.5 mg twice daily for 24 days for DVT prophylaxis.   Discharge wound care:   Complete by: As directed    Follow-up wound care at the skilled nursing facility.   Increase activity slowly   Complete by: As directed       Allergies as of 02/13/2024   No Known Allergies      Medication List     STOP taking these medications    Benefiber Powd   cefadroxil  500 MG capsule Commonly known as: DURICEF   celecoxib  200 MG capsule Commonly known as: CELEBREX        TAKE these medications    acetaminophen  325 MG tablet Commonly known as: TYLENOL  Take 1-2 tablets (325-650 mg total) by mouth every 6 (six) hours as needed for mild pain (pain score 1-3) or fever (or temp > 100.5).   Align 4 MG Caps 1 capsule by mouth once a day   ALPRAZolam  0.5 MG tablet Commonly known as: XANAX  Take 0.25 mg by mouth 2 (two) times daily as needed (restless legs).   apixaban  2.5 MG Tabs tablet Commonly known as: Eliquis  Take 1 tablet (2.5 mg total) by mouth 2 (two) times daily.   aspirin  EC 81 MG tablet Take 1 tablet (81 mg total) by mouth daily. Swallow whole.   CALCIUM -D PO Take 1 tablet by mouth daily.   dexlansoprazole  60 MG capsule Commonly known as: DEXILANT  Take 60 mg by mouth every morning.   escitalopram  10 MG tablet Commonly known as: LEXAPRO  Take 10 mg by mouth in the morning and at bedtime.   HYDROcodone -acetaminophen  5-325 MG tablet Commonly known as: NORCO/VICODIN Take 1 tablet by mouth every 4 (four) hours as needed for moderate pain (pain score 4-6).   levothyroxine  25 MCG tablet Commonly known as: SYNTHROID  Take 25 mcg by mouth daily before breakfast.   losartan  100 MG tablet Commonly known as: COZAAR  Take 1  tablet (100 mg total) by mouth daily. Take once daily at bedtime What changed:  how much to take when to take this additional instructions   metoprolol  succinate 50 MG 24 hr tablet Commonly known as: TOPROL -XL Take 1.5 tablets (75 mg total) by mouth daily. Take with or immediately following a meal.   multivitamin with minerals Tabs tablet Take 1 tablet by mouth daily.   pyridoxine  500 MG tablet Commonly known as: B-6 Take 1 tablet (500 mg total) by mouth daily.   rosuvastatin  20 MG tablet Commonly known as: CRESTOR  Take 1 tablet (20 mg total) by mouth daily. What changed:  when to take this additional instructions   spironolactone  25 MG tablet Commonly known as: ALDACTONE  TAKE 1 TABLET (25 MG TOTAL) BY MOUTH DAILY.   VITAMIN C PO Take 1 tablet by mouth daily.   Vitamin D  (Ergocalciferol ) 1.25 MG (50000 UNIT) Caps capsule Commonly known as: DRISDOL  Take 50,000 Units by mouth every Friday.   VITAMIN E PO Take 1 capsule by mouth daily.   zolpidem  12.5 MG CR tablet Commonly known as: AMBIEN  CR Take 12.5 mg by mouth at bedtime.  Discharge Care Instructions  (From admission, onward)           Start     Ordered   02/13/24 0000  Discharge wound care:       Comments: Follow-up wound care at the skilled nursing facility.   02/13/24 1038            Follow-up Information     Celena Sharper, MD. Schedule an appointment as soon as possible for a visit in 2 week(s).   Specialty: Orthopedic Surgery Contact information: [address] [place] [address] [phone]         Shepard Ade, MD Follow up in 1 week(s).   Specialty: Internal Medicine Contact information: [address] [place] [address] [phone]                No Known Allergies  Consultations: Orthopedics   Procedures/Studies: CT FEMUR RIGHT WO CONTRAST Result Date: 02/09/2024 CLINICAL DATA:  Limping, right leg swelling EXAM: CT OF THE LOWER  RIGHT EXTREMITY WITHOUT CONTRAST TECHNIQUE: Multidetector CT imaging of the right lower extremity was performed according to the standard protocol. RADIATION DOSE REDUCTION: This exam was performed according to the departmental dose-optimization program which includes automated exposure control, adjustment of the mA and/or kV according to patient size and/or use of iterative reconstruction technique. COMPARISON:  Femur intraoperative spot images 02/06/2024 FINDINGS: Bones/Joint/Cartilage Bilateral total knee prosthesis observed. Spiral fracture of the distal femur with intermediary fragment is traversed by a lateral plate and screw fixator with FLAIR distal margin. The plate and screw fixator attaches the dominant proximal fragment and dominant distal fragment, with the 10.4 cm anterior fragment not affixed, and anterior displaced by up to 1 cm. This anterior fragment distal margin does extend to the proximal lip of the femoral component of the prosthesis anteriorly, as shown on the 02/06/2024 CT scan. Additional periprosthetic fracturing along the total knee prosthesis is not well seen, although cortical margins are indistinct due to metal artifact despite the use of metal artifact reduction processing. Chondrocalcinosis in both hips. Ligaments Suboptimally assessed by CT. Muscles and Tendons Expected indistinctness of fascia planes around the fracture site. Soft tissues Subcutaneous edema laterally along the fracture site. There is likewise subcutaneous edema lateral to the right hip. Small amount of gas and fluid density in the subcutaneous tissues of the right buttock IMPRESSION: 1. Spiral fracture of the distal femur, traversed by a lateral plate and screw fixator. The plate and screw fixator attaches the dominant proximal fragment and dominant distal fragment, with the 10.4 cm anterior intermediary fragment not affixed, and anterior displaced by up to 1 cm. This anterior fragment distal margin does extend to the  proximal lip of the femoral component of the prosthesis anteriorly, as shown on the 02/06/2024 CT scan. 2. Additional periprosthetic fracturing along the total knee prosthesis is not shown, although cortical margins are indistinct due to metal artifact despite the use of metal artifact reduction processing. 3. Chondrocalcinosis in both hips. 4. Small amount of gas and fluid density in the subcutaneous tissues of the right buttock. Electronically Signed   By: Ryan Salvage M.D.   On: 02/09/2024 12:35   DG Knee Right Port Result Date: 02/06/2024 CLINICAL DATA:  Fracture repair EXAM: PORTABLE RIGHT KNEE - 1-2 VIEW COMPARISON:  02/06/2024 FINDINGS: Right knee replacement. Interval lateral plate and screw fixation of the distal femoral periprosthetic fracture with residual 1/4 shaft diameter medial and anterior displacement of distal fracture fragment. Gas in the soft tissues consistent  with recent surgery IMPRESSION: Interval plate and screw fixation of distal femoral periprosthetic fracture. Electronically Signed   By: Luke Bun M.D.   On: 02/06/2024 19:48   DG FEMUR, MIN 2 VIEWS RIGHT Result Date: 02/06/2024 CLINICAL DATA:  461500 Elective surgery 461500. EXAM: RIGHT FEMUR 2 VIEWS COMPARISON:  None Available. FINDINGS: Multiple intraoperative spot radiographs are submitted for review. Knee arthroplasty is noted. There is spiral fracture of the distal femur. The subsequent images, there is ORIF with plate and screws. Please refer to the separately issued operative report for further details. Fluoroscopy time: 79.4 seconds Radiation dose: 7.26 mGy IMPRESSION: *Intraoperative support. Electronically Signed   By: Ree Molt M.D.   On: 02/06/2024 17:57   DG C-Arm 1-60 Min-No Report Result Date: 02/06/2024 Fluoroscopy was utilized by the requesting physician.  No radiographic interpretation.   DG C-Arm 1-60 Min-No Report Result Date: 02/06/2024 Fluoroscopy was utilized by the requesting physician.  No  radiographic interpretation.   DG CHEST PORT 1 VIEW Result Date: 02/06/2024 EXAM: 1 VIEW XRAY OF THE CHEST 02/06/2024 06:16:00 AM COMPARISON: 01/19/2024 previous right shoulder arthroplasty. CLINICAL HISTORY: 455472 Closed fracture of right distal femur [facility]) [id number]; [id number] Preoperative examination 214122. Pre-op,femur fracture FINDINGS: LUNGS AND PLEURA: No pleural fluid, interstitial edema or airspace consolidation. HEART AND MEDIASTINUM: Stable cardiomediastinal contours. BONES AND SOFT TISSUES: Postoperative changes noted within the cervical spine. Loop recorder is noted within the projection of the left lower chest. Atherosclerotic calcifications. Signs of previous kyphoplasty within the thoracic and lumbar spine. Remote healed left posterior seventh, eighth and ninth rib fractures. STOMACH AND BOWEL: Moderate-sized hiatal hernia. IMPRESSION: 1. No acute cardiopulmonary pathology. 2. Moderate-sized hiatal hernia. Electronically signed by: Waddell Calk MD 02/06/2024 06:33 AM EDT RP Workstation: [id number]   CT FEMUR RIGHT WO CONTRAST Result Date: 02/06/2024 EXAM: CT OF THE RIGHT FEMUR, WITHOUT IV CONTRAST 02/06/2024 05:20:00 AM TECHNIQUE: Axial images were acquired through the right femur without IV contrast. Reformatted images were reviewed. Automated exposure control, iterative reconstruction, and/or weight based adjustment of the mA/kV was utilized to reduce the radiation dose to as low as reasonably achievable. COMPARISON: Plain film radiographs of the right femur from 01/03/2023. CLINICAL HISTORY: Periprosthetic fracture, surgical planning. Please include full length of femur. FINDINGS: BONES: Comminuted, periprosthetic fracture of the distal right femur status post right total knee arthroplasty. The fracture fragments appear impacted with 90 degrees lateral rotation of the distal fracture fragments with approximately 45 degree proximal angulation and approximately 4 cm impaction. Impacted fracture  fragments extend up to The cement bone interface within the central aspect of the distal femoral metaphysis. Moderate right hip osteoarthritis. JOINTS: No dislocation. The joint spaces are normal. SOFT TISSUES: Signs of intramuscular edema/hematoma identified within the anterior and lateral aspects of the quadriceps muscle. IMPRESSION: 1. Comminuted, periprosthetic fracture of the distal right femur status post right total knee arthroplasty. 2. Signs of intramuscular edema/hematoma within the anterior and lateral aspects of the quadriceps muscle. 3. Moderate right hip osteoarthritis. Electronically signed by: Waddell Calk MD 02/06/2024 05:59 AM EDT RP Workstation: [id number]   DG Knee 1-2 Views Right Result Date: 02/06/2024 EXAM: 1 or 2 VIEW(S) XRAY OF THE RIGHT KNEE 02/06/2024 03:53:00 AM COMPARISON: None available. CLINICAL HISTORY: [id number] Fall 190176. Patient fell a few hours ago and injured leg, unable to bear weight, obvious deformity. FINDINGS: BONES AND JOINTS: Oblique periprosthetic fracture of the distal right femur. The fracture line extends to the anterior tip of the femoral component of the right total knee  arthroplasty. There is apex posterior angulation of the fracture. Moderate knee joint effusion. SOFT TISSUES: The soft tissues are unremarkable. IMPRESSION: 1. Oblique periprosthetic fracture of the distal right femur. Electronically signed by: Norman Gatlin MD 02/06/2024 04:02 AM EDT RP Workstation: [id number]   CUP PACEART REMOTE DEVICE CHECK Result Date: 01/23/2024 ILR summary report received. Battery status OK. Normal device function. No new symptom, tachy, brady, pause episodes. 1 AF event x  2 min that AF cannot be excluded vs PACs.  Not on OAC per EMR.  To triage.  Hx of CVA.  Monthly summary reports and ROV/PRN [place], CVRS  MR BRAIN WO CONTRAST Result Date: 01/19/2024 CLINICAL DATA:  Neuro deficit, acute, stroke suspected EXAM: MRI HEAD WITHOUT CONTRAST TECHNIQUE: Multiplanar, multiecho  pulse sequences of the brain and surrounding structures were obtained without intravenous contrast. COMPARISON:  CT head from earlier today. MRI head April 20, 2021. FINDINGS: Brain: No acute infarction, hemorrhage, hydrocephalus, extra-axial collection or mass lesion. Moderate patchy T2/FLAIR hyperintensities in the white matter are compatible with chronic microvascular disease. Small remote lacunar infarcts in the right frontal and parietal lobes and in the right caudate. Vascular: Major arterial flow voids are maintained at the skull base. Skull and upper cervical spine: Normal marrow signal. Sinuses/Orbits: Negative. IMPRESSION: 1. No evidence of acute intracranial abnormality. 2. Remote lacunar infarcts and moderate chronic microvascular ischemic disease. Electronically Signed   By: Gilmore GORMAN Molt M.D.   On: 01/19/2024 22:19   CT Head Wo Contrast Result Date: 01/19/2024 CLINICAL DATA:  Worsening headache and hypertension. Altered mental status. Fall with head and neck pain. EXAM: CT HEAD WITHOUT CONTRAST CT CERVICAL SPINE WITHOUT CONTRAST TECHNIQUE: Multidetector CT imaging of the head and cervical spine was performed following the standard protocol without intravenous contrast. Multiplanar CT image reconstructions of the cervical spine were also generated. RADIATION DOSE REDUCTION: This exam was performed according to the departmental dose-optimization program which includes automated exposure control, adjustment of the mA and/or kV according to patient size and/or use of iterative reconstruction technique. COMPARISON:  06/12/2023 FINDINGS: CT HEAD FINDINGS Brain: No evidence of intracranial hemorrhage, acute infarction, hydrocephalus, extra-axial collection, or mass lesion/mass effect. Mild cerebral atrophy and moderate chronic small vessel disease are again seen. Vascular:  No hyperdense vessel or other acute findings. Skull: No evidence of fracture or other significant bone abnormality.  Sinuses/Orbits:  No acute findings. Other: None. CT CERVICAL SPINE FINDINGS Alignment: Normal. Skull base and vertebrae: No acute fracture. No primary bone lesion or focal pathologic process. Soft tissues and spinal canal: No prevertebral fluid or swelling. No visible canal hematoma. Disc levels: Previous anterior interbody fusions are again seen at C5-6 and C6-7. Mild degenerative disc disease again seen in C3-4. Mild bilateral facet DJD is also stable. Upper chest: No acute findings. Other: None. IMPRESSION: No acute intracranial abnormality. Stable cerebral atrophy and chronic small vessel disease. No evidence of acute cervical spine fracture or subluxation. Degenerative spondylosis, as described above. Electronically Signed   By: Norleen DELENA Kil M.D.   On: 01/19/2024 15:43   CT Cervical Spine Wo Contrast Result Date: 01/19/2024 CLINICAL DATA:  Worsening headache and hypertension. Altered mental status. Fall with head and neck pain. EXAM: CT HEAD WITHOUT CONTRAST CT CERVICAL SPINE WITHOUT CONTRAST TECHNIQUE: Multidetector CT imaging of the head and cervical spine was performed following the standard protocol without intravenous contrast. Multiplanar CT image reconstructions of the cervical spine were also generated. RADIATION DOSE REDUCTION: This exam was performed according to the departmental dose-optimization program  which includes automated exposure control, adjustment of the mA and/or kV according to patient size and/or use of iterative reconstruction technique. COMPARISON:  06/12/2023 FINDINGS: CT HEAD FINDINGS Brain: No evidence of intracranial hemorrhage, acute infarction, hydrocephalus, extra-axial collection, or mass lesion/mass effect. Mild cerebral atrophy and moderate chronic small vessel disease are again seen. Vascular:  No hyperdense vessel or other acute findings. Skull: No evidence of fracture or other significant bone abnormality. Sinuses/Orbits:  No acute findings. Other: None. CT CERVICAL  SPINE FINDINGS Alignment: Normal. Skull base and vertebrae: No acute fracture. No primary bone lesion or focal pathologic process. Soft tissues and spinal canal: No prevertebral fluid or swelling. No visible canal hematoma. Disc levels: Previous anterior interbody fusions are again seen at C5-6 and C6-7. Mild degenerative disc disease again seen in C3-4. Mild bilateral facet DJD is also stable. Upper chest: No acute findings. Other: None. IMPRESSION: No acute intracranial abnormality. Stable cerebral atrophy and chronic small vessel disease. No evidence of acute cervical spine fracture or subluxation. Degenerative spondylosis, as described above. Electronically Signed   By: Norleen DELENA Kil M.D.   On: 01/19/2024 15:43   DG Chest Portable 1 View Result Date: 01/19/2024 CLINICAL DATA:  Short of breath EXAM: PORTABLE CHEST 1 VIEW COMPARISON:  05/28/2021 FINDINGS: Normal cardiac silhouette. No effusion, infiltrate or pneumothorax. Posterior healed LEFT rib fractures. RIGHT shoulder arthroplasty. Multiple vertebral body augmentation levels. IMPRESSION: No acute cardiopulmonary process. Electronically Signed   By: Jackquline Boxer M.D.   On: 01/19/2024 15:38  Status post ORIF.   Subjective: Patient was seen and examined at bedside.  Overnight events noted. Patient reports doing much better,  reports pain is much improved.   She is going to be discharged to SNF.  Discharge Exam: Vitals:   02/13/24 0557 02/13/24 0752  BP: (!) 164/89 (!) 147/60  Pulse: 84   Resp: 14 16  Temp: 97.9 F (36.6 C) 97.8 F (36.6 C)  SpO2: 98% 97%   Vitals:   02/12/24 1430 02/12/24 1939 02/13/24 0557 02/13/24 0752  BP: 123/73 (!) 151/88 (!) 164/89 (!) 147/60  Pulse: 74 81 84   Resp: 17 16 14 16   Temp: 98 F (36.7 C) 99 F (37.2 C) 97.9 F (36.6 C) 97.8 F (36.6 C)  TempSrc: Oral Oral Oral Oral  SpO2: 98% 98% 98% 97%  Weight:      Height:        General: Pt is alert, awake, not in acute distress Cardiovascular:  RRR, S1/S2 +, no rubs, no gallops Respiratory: CTA bilaterally, no wheezing, no rhonchi Abdominal: Soft, NT, ND, bowel sounds + Extremities: no edema, no cyanosis, S/P ORIF    The results of significant diagnostics from this hospitalization (including imaging, microbiology, ancillary and laboratory) are listed below for reference.     Microbiology: No results found for this or any previous visit (from the past 240 hours).   Labs: BNP (last 3 results) No results for input(s): BNP in the last 8760 hours. Basic Metabolic Panel: Recent Labs  Lab 02/06/24 2044 02/07/24 0438 02/08/24 0527 02/09/24 0532 02/13/24 0637  NA  --  131* 130* 135  --   K  --  4.2 4.3 4.9  --   CL  --  98 97* 99  --   CO2  --  24 25 24   --   GLUCOSE  --  145* 151* 116*  --   BUN  --  29* 31* 21  --   CREATININE 0.76 0.88 0.93 0.60  0.66  CALCIUM   --  8.7* 8.8* 9.1  --   MG  --   --  1.9 1.9  --   PHOS  --   --  3.9 2.6  --    Liver Function Tests: Recent Labs  Lab 02/07/24 0438  AST 137*  ALT 150*  ALKPHOS 56  BILITOT 0.9  PROT 5.0*  ALBUMIN 3.0*   No results for input(s): LIPASE, AMYLASE in the last 168 hours. No results for input(s): AMMONIA in the last 168 hours. CBC: Recent Labs  Lab 02/06/24 2044 02/07/24 0438 02/07/24 1420 02/08/24 0527 02/08/24 1832 02/09/24 0532 02/09/24 0828 02/10/24 0441 02/11/24 0357  WBC 10.6* 7.9  --  7.7  --  6.6  --   --   --   HGB 8.7* 7.9*   < > 6.4* 9.1* 8.2* 7.7* 9.6* 10.3*  HCT 26.8* 24.6*   < > 19.8* 27.2* 24.4* 23.1* 27.8* 29.9*  MCV 101.9* 102.9*  --  103.7*  --  100.4*  --   --   --   PLT 308 263  --  233  --  228  --   --   --    < > = values in this interval not displayed.   Cardiac Enzymes: No results for input(s): CKTOTAL, CKMB, CKMBINDEX, TROPONINI in the last 168 hours. BNP: Invalid input(s): POCBNP CBG: No results for input(s): GLUCAP in the last 168 hours. D-Dimer No results for input(s): DDIMER in the  last 72 hours. Hgb A1c No results for input(s): HGBA1C in the last 72 hours. Lipid Profile No results for input(s): CHOL, HDL, LDLCALC, TRIG, CHOLHDL, LDLDIRECT in the last 72 hours. Thyroid  function studies No results for input(s): TSH, T4TOTAL, T3FREE, THYROIDAB in the last 72 hours.  Invalid input(s): FREET3 Anemia work up No results for input(s): VITAMINB12, FOLATE, FERRITIN, TIBC, IRON, RETICCTPCT in the last 72 hours. Urinalysis    Component Value Date/Time   COLORURINE YELLOW 02/10/2024 1716   APPEARANCEUR CLEAR 02/10/2024 1716   LABSPEC 1.013 02/10/2024 1716   PHURINE 6.0 02/10/2024 1716   GLUCOSEU NEGATIVE 02/10/2024 1716   HGBUR NEGATIVE 02/10/2024 1716   BILIRUBINUR NEGATIVE 02/10/2024 1716   KETONESUR NEGATIVE 02/10/2024 1716   PROTEINUR NEGATIVE 02/10/2024 1716   UROBILINOGEN 0.2 08/18/2011 1407   NITRITE NEGATIVE 02/10/2024 1716   LEUKOCYTESUR NEGATIVE 02/10/2024 1716   Sepsis Labs Recent Labs  Lab 02/06/24 2044 02/07/24 0438 02/08/24 0527 02/09/24 0532  WBC 10.6* 7.9 7.7 6.6   Microbiology No results found for this or any previous visit (from the past 240 hours).   Time coordinating discharge: Over 30 minutes  SIGNED:   Darcel Dawley, MD  [facility] 02/13/2024, 11:09 AM Pager   If 7PM-7AM, please contact night-coverage

## 2024-02-13 NOTE — TOC Progression Note (Signed)
 Transition of Care [facility]) - Progression Note    Patient Details  Name: [name] MRN: [id number] Date of Birth: 09-14-1933  Transition of Care [facility]) CM/SW Contact  Bridget Cordella Simmonds, LCSW Phone Number: 02/13/2024, 10:34 AM  Clinical Narrative:   CSW confirmed with [facility] that they can receive pt today.     Expected Discharge Plan: Skilled Nursing Facility Barriers to Discharge: Continued Medical Work up, SNF Pending bed offer               Expected Discharge Plan and Services In-house Referral: Clinical Social Work   Post Acute Care Choice: Skilled Nursing Facility Living arrangements for the past 2 months: Single Family Home                                       Social Drivers of Health (SDOH) Interventions SDOH Screenings   Food Insecurity: No Food Insecurity (02/07/2024)  Housing: Low Risk  (02/07/2024)  Transportation Needs: No Transportation Needs (02/07/2024)  Utilities: Not At Risk (02/07/2024)  Depression (PHQ2-9): Low Risk  (12/21/2023)  Social Connections: Unknown (02/07/2024)  Tobacco Use: Low Risk  (02/06/2024)    Readmission Risk Interventions     No data to display

## 2024-02-13 NOTE — TOC Transition Note (Signed)
 Transition of Care [facility]) - Discharge Note   Patient Details  Name: [name] MRN: [id number] Date of Birth: 03/12/1934  [facility]) CM/SW Contact:  Bridget Cordella Simmonds, LCSW Phone Number: 02/13/2024, 11:24 AM   Clinical Narrative:   Pt discharging to [facility].  RN call report to [phone].  PTAR called [phone].     Final next level of care: Skilled Nursing Facility Barriers to Discharge: Barriers Resolved   Patient Goals and CMS Choice Patient states their goals for this hospitalization and ongoing recovery are:: use leg again   Choice offered to / list presented to : Patient (pt requesting [facility])      Discharge Placement              Patient chooses bed at: [facility] Patient to be transferred to facility by: [facility]tar Name of family member notified: son [name] Patient and family notified of of transfer: 02/13/24  Discharge Plan and Services Additional resources added to the After Visit Summary for   In-house Referral: Clinical Social Work   Post Acute Care Choice: Skilled Nursing Facility                               Social Drivers of Health (SDOH) Interventions SDOH Screenings   Food Insecurity: No Food Insecurity (02/07/2024)  Housing: Low Risk  (02/07/2024)  Transportation Needs: No Transportation Needs (02/07/2024)  Utilities: Not At Risk (02/07/2024)  Depression (PHQ2-9): Low Risk  (12/21/2023)  Social Connections: Unknown (02/07/2024)  Tobacco Use: Low Risk  (02/06/2024)     Readmission Risk Interventions     No data to display

## 2024-02-14 NOTE — Progress Notes (Signed)
 Remote Loop Recorder Transmission

## 2024-02-16 ENCOUNTER — Telehealth: Payer: Self-pay

## 2024-02-16 NOTE — Telephone Encounter (Signed)
 Alert remote transmission:  AF, tachy AF classified event 9/11 @ 01:27, duration , HR 162-200, variable HR's per dot plot, can not rule out AF, 58sec tachy event follows, NCT, HR's 158-200 - Route to triage high alert Presenting SR vs junctional with PVC's Pt was prescribed low dose Eliquis  bid post hospital for DVT prophylaxis  Recent hx of confirmed AF and plans to see AF clinic after hospitalization and rehab.  See report response from 8/22/5 transmission. Patient was in hospital during time of events. Surgical repair of femur fracture. Currently residing in a nursing facility.   Spoke with patient this am.  Reviewed recent AF events.  Followed up on patient's current status:   States she is pretty much bed bound for 6-8 weeks and is having significant pain.  She is on Metoprolol  and Eliquis  currently.  Likely will need to plan on keeping the Eliquis  beyond the post-30 day period.  I don't think she will be able to come in for appt any time soon.

## 2024-02-20 ENCOUNTER — Ambulatory Visit

## 2024-02-20 DIAGNOSIS — I639 Cerebral infarction, unspecified: Secondary | ICD-10-CM

## 2024-02-21 ENCOUNTER — Ambulatory Visit: Payer: Self-pay | Admitting: Cardiology

## 2024-02-21 LAB — CUP PACEART REMOTE DEVICE CHECK
Date Time Interrogation Session: 20250922231900
Implantable Pulse Generator Implant Date: 20230407

## 2024-02-23 NOTE — Progress Notes (Signed)
 Remote Loop Recorder Transmission

## 2024-02-27 ENCOUNTER — Encounter (HOSPITAL_COMMUNITY): Payer: Self-pay | Admitting: Radiology

## 2024-02-27 ENCOUNTER — Other Ambulatory Visit: Payer: Self-pay

## 2024-02-27 ENCOUNTER — Emergency Department (HOSPITAL_COMMUNITY)

## 2024-02-27 ENCOUNTER — Inpatient Hospital Stay (HOSPITAL_COMMUNITY)
Admission: EM | Admit: 2024-02-27 | Discharge: 2024-03-02 | DRG: 641 | Disposition: A | Discharge status: Skilled Nursing Facility | Source: Skilled Nursing Facility | Attending: Internal Medicine | Admitting: Internal Medicine

## 2024-02-27 DIAGNOSIS — Z96653 Presence of artificial knee joint, bilateral: Secondary | ICD-10-CM | POA: Diagnosis present

## 2024-02-27 DIAGNOSIS — D472 Monoclonal gammopathy: Secondary | ICD-10-CM | POA: Diagnosis present

## 2024-02-27 DIAGNOSIS — Z9841 Cataract extraction status, right eye: Secondary | ICD-10-CM

## 2024-02-27 DIAGNOSIS — F32A Depression, unspecified: Secondary | ICD-10-CM | POA: Diagnosis present

## 2024-02-27 DIAGNOSIS — E861 Hypovolemia: Secondary | ICD-10-CM | POA: Diagnosis present

## 2024-02-27 DIAGNOSIS — Z981 Arthrodesis status: Secondary | ICD-10-CM

## 2024-02-27 DIAGNOSIS — Z79899 Other long term (current) drug therapy: Secondary | ICD-10-CM

## 2024-02-27 DIAGNOSIS — E871 Hypo-osmolality and hyponatremia: Principal | ICD-10-CM | POA: Diagnosis present

## 2024-02-27 DIAGNOSIS — G9341 Metabolic encephalopathy: Secondary | ICD-10-CM | POA: Insufficient documentation

## 2024-02-27 DIAGNOSIS — Z833 Family history of diabetes mellitus: Secondary | ICD-10-CM

## 2024-02-27 DIAGNOSIS — Z96611 Presence of right artificial shoulder joint: Secondary | ICD-10-CM | POA: Diagnosis present

## 2024-02-27 DIAGNOSIS — I480 Paroxysmal atrial fibrillation: Secondary | ICD-10-CM | POA: Diagnosis present

## 2024-02-27 DIAGNOSIS — Z79890 Hormone replacement therapy (postmenopausal): Secondary | ICD-10-CM

## 2024-02-27 DIAGNOSIS — F39 Unspecified mood [affective] disorder: Secondary | ICD-10-CM | POA: Diagnosis present

## 2024-02-27 DIAGNOSIS — Z83438 Family history of other disorder of lipoprotein metabolism and other lipidemia: Secondary | ICD-10-CM

## 2024-02-27 DIAGNOSIS — Z8249 Family history of ischemic heart disease and other diseases of the circulatory system: Secondary | ICD-10-CM

## 2024-02-27 DIAGNOSIS — I110 Hypertensive heart disease with heart failure: Secondary | ICD-10-CM | POA: Diagnosis present

## 2024-02-27 DIAGNOSIS — X58XXXD Exposure to other specified factors, subsequent encounter: Secondary | ICD-10-CM | POA: Diagnosis present

## 2024-02-27 DIAGNOSIS — R519 Headache, unspecified: Secondary | ICD-10-CM | POA: Diagnosis present

## 2024-02-27 DIAGNOSIS — I5032 Chronic diastolic (congestive) heart failure: Secondary | ICD-10-CM | POA: Diagnosis present

## 2024-02-27 DIAGNOSIS — M542 Cervicalgia: Secondary | ICD-10-CM | POA: Diagnosis present

## 2024-02-27 DIAGNOSIS — Z90722 Acquired absence of ovaries, bilateral: Secondary | ICD-10-CM

## 2024-02-27 DIAGNOSIS — Z7982 Long term (current) use of aspirin: Secondary | ICD-10-CM

## 2024-02-27 DIAGNOSIS — R296 Repeated falls: Secondary | ICD-10-CM | POA: Diagnosis present

## 2024-02-27 DIAGNOSIS — G2581 Restless legs syndrome: Secondary | ICD-10-CM | POA: Diagnosis present

## 2024-02-27 DIAGNOSIS — Z8673 Personal history of transient ischemic attack (TIA), and cerebral infarction without residual deficits: Secondary | ICD-10-CM

## 2024-02-27 DIAGNOSIS — I878 Other specified disorders of veins: Secondary | ICD-10-CM | POA: Diagnosis present

## 2024-02-27 DIAGNOSIS — Z8041 Family history of malignant neoplasm of ovary: Secondary | ICD-10-CM

## 2024-02-27 DIAGNOSIS — Z823 Family history of stroke: Secondary | ICD-10-CM

## 2024-02-27 DIAGNOSIS — Z860100 Personal history of colon polyps, unspecified: Secondary | ICD-10-CM

## 2024-02-27 DIAGNOSIS — G4700 Insomnia, unspecified: Secondary | ICD-10-CM | POA: Diagnosis present

## 2024-02-27 DIAGNOSIS — Z9049 Acquired absence of other specified parts of digestive tract: Secondary | ICD-10-CM

## 2024-02-27 DIAGNOSIS — F419 Anxiety disorder, unspecified: Secondary | ICD-10-CM | POA: Diagnosis present

## 2024-02-27 DIAGNOSIS — Z9842 Cataract extraction status, left eye: Secondary | ICD-10-CM

## 2024-02-27 DIAGNOSIS — E860 Dehydration: Secondary | ICD-10-CM | POA: Diagnosis present

## 2024-02-27 DIAGNOSIS — S72401D Unspecified fracture of lower end of right femur, subsequent encounter for closed fracture with routine healing: Secondary | ICD-10-CM

## 2024-02-27 DIAGNOSIS — E039 Hypothyroidism, unspecified: Secondary | ICD-10-CM | POA: Diagnosis present

## 2024-02-27 DIAGNOSIS — M810 Age-related osteoporosis without current pathological fracture: Secondary | ICD-10-CM | POA: Diagnosis present

## 2024-02-27 DIAGNOSIS — Z803 Family history of malignant neoplasm of breast: Secondary | ICD-10-CM

## 2024-02-27 DIAGNOSIS — E785 Hyperlipidemia, unspecified: Secondary | ICD-10-CM | POA: Diagnosis present

## 2024-02-27 DIAGNOSIS — Z9079 Acquired absence of other genital organ(s): Secondary | ICD-10-CM

## 2024-02-27 DIAGNOSIS — I2510 Atherosclerotic heart disease of native coronary artery without angina pectoris: Secondary | ICD-10-CM | POA: Diagnosis present

## 2024-02-27 DIAGNOSIS — Z800 Family history of malignant neoplasm of digestive organs: Secondary | ICD-10-CM

## 2024-02-27 DIAGNOSIS — Z7901 Long term (current) use of anticoagulants: Secondary | ICD-10-CM

## 2024-02-27 DIAGNOSIS — Z90710 Acquired absence of both cervix and uterus: Secondary | ICD-10-CM

## 2024-02-27 DIAGNOSIS — M9711XD Periprosthetic fracture around internal prosthetic right knee joint, subsequent encounter: Secondary | ICD-10-CM

## 2024-02-27 LAB — CBC WITH DIFFERENTIAL/PLATELET
Abs Immature Granulocytes: 0.07 K/uL (ref 0.00–0.07)
Basophils Absolute: 0 K/uL (ref 0.0–0.1)
Basophils Relative: 0 %
Eosinophils Absolute: 0 K/uL (ref 0.0–0.5)
Eosinophils Relative: 0 %
HCT: 34.1 % — ABNORMAL LOW (ref 36.0–46.0)
Hemoglobin: 11.5 g/dL — ABNORMAL LOW (ref 12.0–15.0)
Immature Granulocytes: 1 %
Lymphocytes Relative: 11 %
Lymphs Abs: 0.9 K/uL (ref 0.7–4.0)
MCH: 32.9 pg (ref 26.0–34.0)
MCHC: 33.7 g/dL (ref 30.0–36.0)
MCV: 97.4 fL (ref 80.0–100.0)
Monocytes Absolute: 1.2 K/uL — ABNORMAL HIGH (ref 0.1–1.0)
Monocytes Relative: 15 %
Neutro Abs: 5.8 K/uL (ref 1.7–7.7)
Neutrophils Relative %: 73 %
Platelets: 320 K/uL (ref 150–400)
RBC: 3.5 MIL/uL — ABNORMAL LOW (ref 3.87–5.11)
RDW: 14.6 % (ref 11.5–15.5)
WBC: 8 K/uL (ref 4.0–10.5)
nRBC: 0 % (ref 0.0–0.2)

## 2024-02-27 LAB — BASIC METABOLIC PANEL WITH GFR
Anion gap: 10 (ref 5–15)
BUN: 9 mg/dL (ref 8–23)
CO2: 24 mmol/L (ref 22–32)
Calcium: 9.9 mg/dL (ref 8.9–10.3)
Chloride: 82 mmol/L — ABNORMAL LOW (ref 98–111)
Creatinine, Ser: 0.5 mg/dL (ref 0.44–1.00)
GFR, Estimated: >60 mL/min (ref >60)
Glucose, Bld: 134 mg/dL — ABNORMAL HIGH (ref 70–99)
Potassium: 4.2 mmol/L (ref 3.5–5.1)
Sodium: 116 mmol/L — CL (ref 135–145)

## 2024-02-27 MED ORDER — SODIUM CHLORIDE 0.9 % IV BOLUS
500.0000 mL | Freq: Once | INTRAVENOUS | Status: AC
Start: 2024-02-27 — End: 2024-02-28
  Administered 2024-02-27: 500 mL via INTRAVENOUS

## 2024-02-27 NOTE — ED Provider Notes (Signed)
 [place] EMERGENCY DEPARTMENT AT [facility] Provider Note   CSN: [id number] Arrival date & time: 02/27/24  2214     Patient presents with: Abnormal Labs   [name] is a 89 y.o. female.  {Add pertinent medical, surgical, social history, OB history to YEP:67052} The history is provided by the patient and medical records.   [name] is a 89 y.o. female who presents to the Emergency Department complaining of *** HA, chest pressure, neck pain.  Two weeks.  Feels confused. Has nausea. Not eating and drinking well.  Gets food from meals on wheels.    [facility] Prior to Admission medications   Medication Sig Start Date End Date Taking? Authorizing Provider  acetaminophen  (TYLENOL ) 325 MG tablet Take 1-2 tablets (325-650 mg total) by mouth every 6 (six) hours as needed for mild pain (pain score 1-3) or fever (or temp > 100.5). 02/08/24   Deward Eck, PA-C  ALPRAZolam  (XANAX ) 0.5 MG tablet Take 0.25 mg by mouth 2 (two) times daily as needed (restless legs).    [provider]  apixaban  (ELIQUIS ) 2.5 MG TABS tablet Take 1 tablet (2.5 mg total) by mouth 2 (two) times daily. 02/08/24 03/09/24  Deward Eck, PA-C  Ascorbic Acid (VITAMIN C PO) Take 1 tablet by mouth daily.    [provider]  aspirin  EC 81 MG tablet Take 1 tablet (81 mg total) by mouth daily. Swallow whole. 02/20/23   Lelon Hamilton T, PA-C  Calcium  Carbonate-Vitamin D  (CALCIUM -D PO) Take 1 tablet by mouth daily.    [provider]  dexlansoprazole  (DEXILANT ) 60 MG capsule Take 60 mg by mouth every morning.    [provider]  escitalopram  (LEXAPRO ) 10 MG tablet Take 10 mg by mouth in the morning and at bedtime.    [provider]  HYDROcodone -acetaminophen  (NORCO/VICODIN) 5-325 MG tablet Take 1 tablet by mouth every 4 (four) hours as needed for moderate pain (pain score 4-6). 02/08/24   Deward Eck, PA-C  levothyroxine  (SYNTHROID , LEVOTHROID) 25 MCG tablet  Take 25 mcg by mouth daily before breakfast.     [provider]  losartan  (COZAAR ) 100 MG tablet Take 1 tablet (100 mg total) by mouth daily. Take once daily at bedtime Patient taking differently: Take 50 mg by mouth 2 (two) times daily. Patient could not tolerate 100 mg at once 10/04/23   Thukkani, Arun K, MD  metoprolol  succinate (TOPROL -XL) 50 MG 24 hr tablet Take 1.5 tablets (75 mg total) by mouth daily. Take with or immediately following a meal. 12/28/22   Cindie Ole DASEN, MD  Multiple Vitamin (MULTIVITAMIN WITH MINERALS) TABS tablet Take 1 tablet by mouth daily.    [provider]  Probiotic Product (ALIGN) 4 MG CAPS 1 capsule by mouth once a day    [provider]  pyridoxine  (B-6) 500 MG tablet Take 1 tablet (500 mg total) by mouth daily. 02/24/22   Timmy Maude SAUNDERS, MD  rosuvastatin  (CRESTOR ) 20 MG tablet Take 1 tablet (20 mg total) by mouth daily. Patient taking differently: Take 20 mg by mouth See admin instructions. Takes 3 times per week. 04/22/21   Jerri Keys, MD  spironolactone  (ALDACTONE ) 25 MG tablet TAKE 1 TABLET (25 MG TOTAL) BY MOUTH DAILY. 12/11/23   Thukkani, Arun K, MD  Vitamin D , Ergocalciferol , (DRISDOL ) 50000 UNITS CAPS Take 50,000 Units by mouth every Friday.    [provider]  VITAMIN E PO Take 1 capsule by mouth daily.  [provider]  zolpidem  (AMBIEN  CR) 12.5 MG CR tablet Take 12.5 mg by mouth at bedtime.    [provider]    Allergies: Patient has no known allergies.    Review of Systems  All other systems reviewed and are negative.   Updated Vital Signs BP (!) 167/91 (BP Location: Right Arm)   Pulse 91   Temp 98.3 F (36.8 C) (Oral)   Resp 18   Ht 5' (1.524 m)   Wt 54 kg   SpO2 100%   BMI 23.24 kg/m   Physical Exam Vitals and nursing note reviewed.  Constitutional:      Appearance: She is well-developed.  HENT:     Head: Normocephalic and atraumatic.  Cardiovascular:     Rate and Rhythm:  Normal rate and regular rhythm.     Heart sounds: No murmur heard. Pulmonary:     Effort: Pulmonary effort is normal. No respiratory distress.     Breath sounds: Normal breath sounds.  Abdominal:     Palpations: Abdomen is soft.     Tenderness: There is no abdominal tenderness. There is no guarding or rebound.  Musculoskeletal:        General: No tenderness.  Skin:    General: Skin is warm and dry.  Neurological:     Mental Status: She is alert and oriented to person, place, and time.     Comments: Mildly confused.  5/5 grip strength in BUE  Psychiatric:        Behavior: Behavior normal.     (all labs ordered are listed, but only abnormal results are displayed) Labs Reviewed  CBC WITH DIFFERENTIAL/PLATELET - Abnormal; Notable for the following components:      Result Value   RBC 3.50 (*)    Hemoglobin 11.5 (*)    HCT 34.1 (*)    Monocytes Absolute 1.2 (*)    All other components within normal limits  BASIC METABOLIC PANEL WITH GFR    EKG: None  Radiology: No results found.  {Document cardiac monitor, telemetry assessment procedure when appropriate:32947} Procedures   Medications Ordered in the ED - No data to display    {Click here for ABCD2, HEART and other calculators REFRESH Note before signing:1}                              Medical Decision Making Amount and/or Complexity of Data Reviewed Labs: ordered.   ***  {Document critical care time when appropriate  Document review of labs and clinical decision tools ie CHADS2VASC2, etc  Document your independent review of radiology images and any outside records  Document your discussion with family members, caretakers and with consultants  Document social determinants of health affecting pt's care  Document your decision making why or why not admission, treatments were needed:32947:::1}   Final diagnoses:  None    ED Discharge Orders     None

## 2024-02-27 NOTE — ED Triage Notes (Signed)
 Pt bib [facility] due to having had blood work drawn at her facility and her Na+ is 116. Pt has no complaints. Of note, she does have a fractured femur that took place 2 weeks ago.

## 2024-02-27 NOTE — ED Notes (Signed)
 Pt to imaging

## 2024-02-28 ENCOUNTER — Encounter (HOSPITAL_COMMUNITY): Payer: Self-pay

## 2024-02-28 ENCOUNTER — Emergency Department (HOSPITAL_COMMUNITY)

## 2024-02-28 ENCOUNTER — Inpatient Hospital Stay (HOSPITAL_COMMUNITY)

## 2024-02-28 DIAGNOSIS — E860 Dehydration: Secondary | ICD-10-CM | POA: Diagnosis present

## 2024-02-28 DIAGNOSIS — G2581 Restless legs syndrome: Secondary | ICD-10-CM | POA: Diagnosis present

## 2024-02-28 DIAGNOSIS — F39 Unspecified mood [affective] disorder: Secondary | ICD-10-CM | POA: Diagnosis present

## 2024-02-28 DIAGNOSIS — X58XXXD Exposure to other specified factors, subsequent encounter: Secondary | ICD-10-CM | POA: Diagnosis present

## 2024-02-28 DIAGNOSIS — R296 Repeated falls: Secondary | ICD-10-CM | POA: Diagnosis present

## 2024-02-28 DIAGNOSIS — E785 Hyperlipidemia, unspecified: Secondary | ICD-10-CM | POA: Diagnosis present

## 2024-02-28 DIAGNOSIS — D472 Monoclonal gammopathy: Secondary | ICD-10-CM | POA: Diagnosis present

## 2024-02-28 DIAGNOSIS — E871 Hypo-osmolality and hyponatremia: Secondary | ICD-10-CM | POA: Diagnosis present

## 2024-02-28 DIAGNOSIS — S72401D Unspecified fracture of lower end of right femur, subsequent encounter for closed fracture with routine healing: Secondary | ICD-10-CM | POA: Diagnosis not present

## 2024-02-28 DIAGNOSIS — G4700 Insomnia, unspecified: Secondary | ICD-10-CM | POA: Diagnosis present

## 2024-02-28 DIAGNOSIS — G9341 Metabolic encephalopathy: Secondary | ICD-10-CM | POA: Diagnosis not present

## 2024-02-28 DIAGNOSIS — I2510 Atherosclerotic heart disease of native coronary artery without angina pectoris: Secondary | ICD-10-CM | POA: Diagnosis present

## 2024-02-28 DIAGNOSIS — M9711XD Periprosthetic fracture around internal prosthetic right knee joint, subsequent encounter: Secondary | ICD-10-CM | POA: Diagnosis not present

## 2024-02-28 DIAGNOSIS — Z79899 Other long term (current) drug therapy: Secondary | ICD-10-CM | POA: Diagnosis not present

## 2024-02-28 DIAGNOSIS — I5032 Chronic diastolic (congestive) heart failure: Secondary | ICD-10-CM | POA: Diagnosis present

## 2024-02-28 DIAGNOSIS — F32A Depression, unspecified: Secondary | ICD-10-CM | POA: Diagnosis present

## 2024-02-28 DIAGNOSIS — Z96653 Presence of artificial knee joint, bilateral: Secondary | ICD-10-CM | POA: Diagnosis present

## 2024-02-28 DIAGNOSIS — Z79890 Hormone replacement therapy (postmenopausal): Secondary | ICD-10-CM | POA: Diagnosis not present

## 2024-02-28 DIAGNOSIS — Z8249 Family history of ischemic heart disease and other diseases of the circulatory system: Secondary | ICD-10-CM | POA: Diagnosis not present

## 2024-02-28 DIAGNOSIS — I110 Hypertensive heart disease with heart failure: Secondary | ICD-10-CM | POA: Diagnosis present

## 2024-02-28 DIAGNOSIS — Z7982 Long term (current) use of aspirin: Secondary | ICD-10-CM | POA: Diagnosis not present

## 2024-02-28 DIAGNOSIS — E039 Hypothyroidism, unspecified: Secondary | ICD-10-CM | POA: Diagnosis present

## 2024-02-28 DIAGNOSIS — Z981 Arthrodesis status: Secondary | ICD-10-CM | POA: Diagnosis not present

## 2024-02-28 DIAGNOSIS — E861 Hypovolemia: Secondary | ICD-10-CM | POA: Diagnosis present

## 2024-02-28 DIAGNOSIS — I480 Paroxysmal atrial fibrillation: Secondary | ICD-10-CM | POA: Diagnosis present

## 2024-02-28 DIAGNOSIS — Z7901 Long term (current) use of anticoagulants: Secondary | ICD-10-CM | POA: Diagnosis not present

## 2024-02-28 LAB — HEPATIC FUNCTION PANEL
ALT: 21 U/L (ref 0–44)
AST: 27 U/L (ref 15–41)
Albumin: 3.8 g/dL (ref 3.5–5.0)
Alkaline Phosphatase: 127 U/L — ABNORMAL HIGH (ref 38–126)
Bilirubin, Direct: 0.4 mg/dL — ABNORMAL HIGH (ref 0.0–0.2)
Indirect Bilirubin: 0.4 mg/dL (ref 0.3–0.9)
Total Bilirubin: 0.8 mg/dL (ref 0.0–1.2)
Total Protein: 6 g/dL — ABNORMAL LOW (ref 6.5–8.1)

## 2024-02-28 LAB — URINALYSIS, ROUTINE W REFLEX MICROSCOPIC
Bilirubin Urine: NEGATIVE
Glucose, UA: NEGATIVE mg/dL
Hgb urine dipstick: NEGATIVE
Ketones, ur: NEGATIVE mg/dL
Leukocytes,Ua: NEGATIVE
Nitrite: POSITIVE — AB
Protein, ur: NEGATIVE mg/dL
Specific Gravity, Urine: 1.003 — ABNORMAL LOW (ref 1.005–1.030)
pH: 7 (ref 5.0–8.0)

## 2024-02-28 LAB — BASIC METABOLIC PANEL WITH GFR
Anion gap: 9 (ref 5–15)
Anion gap: 10 (ref 5–15)
Anion gap: 10 (ref 5–15)
Anion gap: 9 (ref 5–15)
BUN: 7 mg/dL — ABNORMAL LOW (ref 8–23)
BUN: 8 mg/dL (ref 8–23)
BUN: 10 mg/dL (ref 8–23)
BUN: 14 mg/dL (ref 8–23)
CO2: 23 mmol/L (ref 22–32)
CO2: 23 mmol/L (ref 22–32)
CO2: 23 mmol/L (ref 22–32)
CO2: 23 mmol/L (ref 22–32)
Calcium: 9.3 mg/dL (ref 8.9–10.3)
Calcium: 8.7 mg/dL — ABNORMAL LOW (ref 8.9–10.3)
Calcium: 9.5 mg/dL (ref 8.9–10.3)
Calcium: 9.1 mg/dL (ref 8.9–10.3)
Chloride: 88 mmol/L — ABNORMAL LOW (ref 98–111)
Chloride: 93 mmol/L — ABNORMAL LOW (ref 98–111)
Chloride: 91 mmol/L — ABNORMAL LOW (ref 98–111)
Chloride: 91 mmol/L — ABNORMAL LOW (ref 98–111)
Creatinine, Ser: 0.46 mg/dL (ref 0.44–1.00)
Creatinine, Ser: 0.53 mg/dL (ref 0.44–1.00)
Creatinine, Ser: 0.51 mg/dL (ref 0.44–1.00)
Creatinine, Ser: 0.53 mg/dL (ref 0.44–1.00)
GFR, Estimated: >60 mL/min (ref >60)
GFR, Estimated: >60 mL/min (ref >60)
GFR, Estimated: >60 mL/min (ref >60)
GFR, Estimated: >60 mL/min (ref >60)
Glucose, Bld: 117 mg/dL — ABNORMAL HIGH (ref 70–99)
Glucose, Bld: 102 mg/dL — ABNORMAL HIGH (ref 70–99)
Glucose, Bld: 100 mg/dL — ABNORMAL HIGH (ref 70–99)
Glucose, Bld: 129 mg/dL — ABNORMAL HIGH (ref 70–99)
Potassium: 4 mmol/L (ref 3.5–5.1)
Potassium: 3.4 mmol/L — ABNORMAL LOW (ref 3.5–5.1)
Potassium: 4.5 mmol/L (ref 3.5–5.1)
Potassium: 4.7 mmol/L (ref 3.5–5.1)
Sodium: 120 mmol/L — ABNORMAL LOW (ref 135–145)
Sodium: 126 mmol/L — ABNORMAL LOW (ref 135–145)
Sodium: 124 mmol/L — ABNORMAL LOW (ref 135–145)
Sodium: 123 mmol/L — ABNORMAL LOW (ref 135–145)

## 2024-02-28 LAB — NA AND K (SODIUM & POTASSIUM), RAND UR
Potassium Urine: 16 mmol/L
Sodium, Ur: <30 mmol/L

## 2024-02-28 LAB — TROPONIN T, HIGH SENSITIVITY
Troponin T High Sensitivity: 24 ng/L — ABNORMAL HIGH (ref 0–19)
Troponin T High Sensitivity: 26 ng/L — ABNORMAL HIGH (ref 0–19)

## 2024-02-28 LAB — PRO BRAIN NATRIURETIC PEPTIDE: Pro Brain Natriuretic Peptide: 596 pg/mL — ABNORMAL HIGH (ref <300.0)

## 2024-02-28 LAB — TSH: TSH: 1.93 u[IU]/mL (ref 0.350–4.500)

## 2024-02-28 LAB — MRSA NEXT GEN BY PCR, NASAL: MRSA by PCR Next Gen: NOT DETECTED

## 2024-02-28 LAB — OSMOLALITY, URINE: Osmolality, Ur: 145 mosm/kg — ABNORMAL LOW (ref 300–900)

## 2024-02-28 LAB — OSMOLALITY: Osmolality: 255 mosm/kg — ABNORMAL LOW (ref 275–295)

## 2024-02-28 MED ORDER — DEXTROSE 5 % IV SOLN
INTRAVENOUS | Status: DC
Start: 2024-02-28 — End: 2024-02-28

## 2024-02-28 MED ORDER — ONDANSETRON HCL 4 MG/2ML IJ SOLN: 4.0000 mg | Freq: Four times a day (QID) | INTRAMUSCULAR | Status: DC | PRN

## 2024-02-28 MED ORDER — SENNOSIDES-DOCUSATE SODIUM 8.6-50 MG PO TABS
2.0000 | ORAL_TABLET | Freq: Every evening | ORAL | Status: DC | PRN
Start: 2024-02-28 — End: 2024-03-02
  Administered 2024-02-28 – 2024-03-01 (×3): 2 via ORAL
  Filled 2024-02-28 (×3): qty 2

## 2024-02-28 MED ORDER — ZOLPIDEM TARTRATE 5 MG PO TABS: 5.0000 mg | ORAL_TABLET | Freq: Every evening | ORAL | Status: DC | PRN

## 2024-02-28 MED ORDER — APIXABAN 2.5 MG PO TABS
2.5000 mg | ORAL_TABLET | Freq: Two times a day (BID) | ORAL | Status: DC
Start: 2024-02-28 — End: 2024-03-02
  Administered 2024-02-28 – 2024-03-02 (×7): 2.5 mg via ORAL
  Filled 2024-02-28 (×7): qty 1

## 2024-02-28 MED ORDER — ENSURE PLUS HIGH PROTEIN PO LIQD
237.0000 mL | Freq: Two times a day (BID) | ORAL | Status: DC
Start: 2024-02-29 — End: 2024-03-02
  Administered 2024-02-29 – 2024-03-02 (×4): 237 mL via ORAL

## 2024-02-28 MED ORDER — ALBUTEROL SULFATE (2.5 MG/3ML) 0.083% IN NEBU: 2.5000 mg | INHALATION_SOLUTION | RESPIRATORY_TRACT | Status: DC | PRN

## 2024-02-28 MED ORDER — PANTOPRAZOLE SODIUM 40 MG PO TBEC
40.0000 mg | DELAYED_RELEASE_TABLET | Freq: Every day | ORAL | Status: DC
  Administered 2024-02-28 – 2024-03-02 (×4): 40 mg via ORAL
  Filled 2024-02-28 (×4): qty 1

## 2024-02-28 MED ORDER — METOPROLOL SUCCINATE ER 25 MG PO TB24
75.0000 mg | ORAL_TABLET | Freq: Every day | ORAL | Status: DC
  Administered 2024-02-28 – 2024-03-02 (×4): 75 mg via ORAL
  Filled 2024-02-28 (×3): qty 3
  Filled 2024-02-28: qty 1

## 2024-02-28 MED ORDER — ESCITALOPRAM OXALATE 10 MG PO TABS
10.0000 mg | ORAL_TABLET | Freq: Two times a day (BID) | ORAL | Status: DC
Start: 2024-02-28 — End: 2024-02-28

## 2024-02-28 MED ORDER — ROSUVASTATIN CALCIUM 20 MG PO TABS
20.0000 mg | ORAL_TABLET | Freq: Every day | ORAL | Status: DC
  Administered 2024-02-28 – 2024-03-02 (×4): 20 mg via ORAL
  Filled 2024-02-28 (×4): qty 1

## 2024-02-28 MED ORDER — IOHEXOL 350 MG/ML SOLN
75.0000 mL | Freq: Once | INTRAVENOUS | Status: AC | PRN
  Administered 2024-02-28: 75 mL via INTRAVENOUS

## 2024-02-28 MED ORDER — ALPRAZOLAM 0.25 MG PO TABS
0.2500 mg | ORAL_TABLET | Freq: Three times a day (TID) | ORAL | Status: DC | PRN
  Administered 2024-02-28 – 2024-03-02 (×5): 0.25 mg via ORAL
  Filled 2024-02-28 (×5): qty 1

## 2024-02-28 MED ORDER — LOSARTAN POTASSIUM 50 MG PO TABS
50.0000 mg | ORAL_TABLET | Freq: Every day | ORAL | Status: DC
Start: 2024-02-28 — End: 2024-02-28

## 2024-02-28 MED ORDER — SODIUM CHLORIDE 0.9% FLUSH
3.0000 mL | Freq: Two times a day (BID) | INTRAVENOUS | Status: DC
  Administered 2024-02-28 – 2024-03-02 (×7): 3 mL via INTRAVENOUS

## 2024-02-28 MED ORDER — POLYETHYLENE GLYCOL 3350 17 G PO PACK
17.0000 g | PACK | Freq: Every day | ORAL | Status: DC | PRN
  Administered 2024-03-01: 17 g via ORAL
  Filled 2024-02-28: qty 1

## 2024-02-28 MED ORDER — CHLORHEXIDINE GLUCONATE CLOTH 2 % EX PADS
6.0000 | MEDICATED_PAD | Freq: Every day | CUTANEOUS | Status: DC
Start: 2024-02-28 — End: 2024-03-02
  Administered 2024-02-28 – 2024-03-02 (×4): 6 via TOPICAL

## 2024-02-28 MED ORDER — MELATONIN 3 MG PO TABS
6.0000 mg | ORAL_TABLET | Freq: Every day | ORAL | Status: DC
  Administered 2024-02-28 – 2024-03-01 (×3): 6 mg via ORAL
  Filled 2024-02-28 (×3): qty 2

## 2024-02-28 MED ORDER — ACETAMINOPHEN 500 MG PO TABS
1000.0000 mg | ORAL_TABLET | Freq: Four times a day (QID) | ORAL | Status: DC | PRN
  Administered 2024-02-28 – 2024-03-02 (×7): 1000 mg via ORAL
  Filled 2024-02-28 (×8): qty 2

## 2024-02-28 MED ORDER — SODIUM CHLORIDE 0.9 % IV SOLN: INTRAVENOUS | Status: AC

## 2024-02-28 MED ORDER — LEVOTHYROXINE SODIUM 25 MCG PO TABS
25.0000 ug | ORAL_TABLET | Freq: Every day | ORAL | Status: DC
  Administered 2024-02-28 – 2024-03-02 (×4): 25 ug via ORAL
  Filled 2024-02-28 (×4): qty 1

## 2024-02-28 MED ORDER — POTASSIUM CHLORIDE CRYS ER 20 MEQ PO TBCR
40.0000 meq | EXTENDED_RELEASE_TABLET | Freq: Once | ORAL | Status: AC
  Administered 2024-02-28: 40 meq via ORAL
  Filled 2024-02-28: qty 2

## 2024-02-28 NOTE — Telephone Encounter (Signed)
 Patient is currently admitted to the hospital. Will follow up once discharged.

## 2024-02-28 NOTE — Evaluation (Signed)
 Physical Therapy Evaluation Patient Details Name: [name] MRN: [id number] DOB: 01-May-1934 Today's Date: 02/28/2024  History of Present Illness  [name] is an 89 y.o. female sent to Ed from SNF with hyponatremia. S/P ORIF for  right distal femur supracondylar fracture on 9/9. PMHx: anxiety, OA, colon polyps, CAD, dilated cardiomyopathy, chronic systolic CHF, GERD, hiatal hernia, HLD, HTN, hypothyroidism, MGUS, osteoporosis, peripheral neuropathy, restless leg syndrome, nonhemorrhagic CVA, TIAs, detrusor instability, and vertebral fracture.  Clinical Impression  Pt admitted with above diagnosis.  Pt currently with functional limitations due to the deficits listed below (see PT Problem List). Pt will benefit from acute skilled PT to increase their independence and safety with mobility to allow discharge.     The patient is  very alert and talkative.  Patient comes from SNF rehab following right femur fracture. Patient reports   that she is strict no weight on the RLE, reports therapy limited  to bed exercises and that she uses a bedpan. She does state that she has been in a WC.    Recommend return to SNF for rehab. Patient will benefit from continued inpatient follow up therapy, <3 hours/day.      If plan is discharge home, recommend the following: Two people to help with walking and/or transfers;Two people to help with bathing/dressing/bathroom;Assistance with cooking/housework;Assist for transportation;Help with stairs or ramp for entrance   Can travel by private vehicle   No    Equipment Recommendations None recommended by PT  Recommendations for Other Services       Functional Status Assessment Patient has had a recent decline in their functional status and demonstrates the ability to make significant improvements in function in a reasonable and predictable amount of time.     Precautions / Restrictions Precautions Precautions: Fall Recall of Precautions/Restrictions:  Impaired Precaution/Restrictions Comments: no limits to ROM, strict TDWB RLE, pt. reports that shehad followup orhto visit today Restrictions RLE Weight Bearing Per Provider Order: Touchdown weight bearing      Mobility  Bed Mobility Overal bed mobility: Needs Assistance Bed Mobility: Supine to Sit Rolling: Min assist         General bed mobility comments: patient able  to move both legs over bed edge and move trunk to sitting upright.   tends to lean posterior with cues to sit forward    Transfers                   General transfer comment: NT as patient  on stretcher and feet do not touch floor and pt is TDWB on the RLE    Ambulation/Gait                  Stairs            Wheelchair Mobility     Tilt Bed    Modified Rankin (Stroke Patients Only)       Balance   Sitting-balance support: Feet supported, Bilateral upper extremity supported Sitting balance-Leahy Scale: Poor   Postural control: Posterior lean     Standing balance comment: unable to attempt                             Pertinent Vitals/Pain Pain Assessment Pain Score: 10-Worst pain ever Pain Location: right leg Pain Descriptors / Indicators: Operative site guarding, Grimacing, Discomfort, Aching Pain Intervention(s): Monitored during session    Home Living Family/patient expects to be discharged to:: Skilled nursing  facility Living Arrangements: Alone Available Help at Discharge: Family;Available 24 hours/day Type of Home: House Home Access: Stairs to enter Entrance Stairs-Rails: Right;Left;Can reach both Entrance Stairs-Number of Steps: 10   Home Layout: One level Home Equipment: Grab bars - tub/shower;Shower seat;Grab bars - toilet;Rollator (4 wheels);Cane - single point Additional Comments: pt reportsin SNF  that  therapy was limited, mostly in the bed, stated did get into [facility] but was not trying to stand  Dr. Lawernce sAid strict no weight. per pt.     Prior Function Prior Level of Function : Independent/Modified Independent;Needs assist             Mobility Comments: Uses a Rollator or cane. Pt reports 6 falls in August ADLs Comments: Mod I; uses a weekly medication planner and daughter assists with setting it up; pt receives Meals on Wheels; [place] provides transportation     Extremity/Trunk Assessment        Lower Extremity Assessment RLE Deficits / Details: hip flexion WFL, knee flexion 5-50, able to SLR RLE Sensation: history of peripheral neuropathy       Communication   Communication Communication: Impaired Factors Affecting Communication: Hearing impaired    Cognition Arousal: Alert Behavior During Therapy: WFL for tasks assessed/performed   PT - Cognitive impairments: Difficult to assess, No family/caregiver present to determine baseline                       PT - Cognition Comments: difficult to get clear picture of what therapy was working on in rehab, cues to stay on task Following commands: Intact       Cueing Cueing Techniques: Verbal cues     General Comments      Exercises     Assessment/Plan    PT Assessment Patient needs continued PT services  PT Problem List Decreased strength;Decreased range of motion;Decreased activity tolerance;Decreased balance;Decreased mobility;Decreased knowledge of use of DME;Decreased safety awareness;Decreased knowledge of precautions;Pain       PT Treatment Interventions DME instruction;Gait training;Stair training;Functional mobility training;Therapeutic activities;Therapeutic exercise;Balance training;Patient/family education    PT Goals (Current goals can be found in the Care Plan section)  Acute Rehab PT Goals Patient Stated Goal: go home PT Goal Formulation: With patient Time For Goal Achievement: 03/13/24 Potential to Achieve Goals: Fair    Frequency Min 2X/week     Co-evaluation PT/OT/SLP Co-Evaluation/Treatment: Yes Reason for  Co-Treatment: For patient/therapist safety;To address functional/ADL transfers PT goals addressed during session: Mobility/safety with mobility;Balance;Proper use of DME OT goals addressed during session: ADL's and self-care       AM-PAC PT 6 Clicks Mobility  Outcome Measure Help needed turning from your back to your side while in a flat bed without using bedrails?: A Little Help needed moving from lying on your back to sitting on the side of a flat bed without using bedrails?: A Little Help needed moving to and from a bed to a chair (including a wheelchair)?: Total Help needed standing up from a chair using your arms (e.g., wheelchair or bedside chair)?: Total Help needed to walk in hospital room?: Total Help needed climbing 3-5 steps with a railing? : Total 6 Click Score: 10    End of Session   Activity Tolerance: Patient tolerated treatment well;Patient limited by fatigue Patient left:  (siting on edge of stretcher with OT)   PT Visit Diagnosis: Pain;Difficulty in walking, not elsewhere classified (R26.2);Other abnormalities of gait and mobility (R26.89);Unsteadiness on feet (R26.81) Pain - Right/Left: Right Pain -  part of body: Leg    Time: 1100-1113 PT Time Calculation (min) (ACUTE ONLY): 13 min   Charges:   PT Evaluation $PT Eval Low Complexity: 1 Low   PT General Charges $$ ACUTE PT VISIT: 1 Visit         Darice Potters PT Acute Rehabilitation Services Office [phone]   Potters Darice Norris 02/28/2024, 1:14 PM

## 2024-02-28 NOTE — Evaluation (Signed)
 Occupational Therapy Evaluation Patient Details Name: [name] MRN: [id number] DOB: 10/16/1933 Today's Date: 02/28/2024   History of Present Illness   Tiaunna ZAHRIA DING is an 89 yr old female sent to ED from SNF with hyponatremia. She is s/p an ORIF for right distal femur fracture on 02/06/24. PMH: anxiety, OA, colon polyps, CAD, dilated cardiomyopathy, chronic systolic CHF, GERD, hiatal hernia, HLD, HTN, hypothyroidism, MGUS, osteoporosis, peripheral neuropathy, restless leg syndrome, nonhemorrhagic CVA, TIAs, detrusor instability, and vertebral fracture.     Clinical Impressions The pt is currently well below her baseline level of functioning for self-care management. She is limited by the below listed deficits (see OT problem list). During the session, she required min-mod assist to transfer from supine to sit and total assist to donn her socks seated EOB. She initially presented with fair sitting balance EOB while she performed upper body grooming. After a couple minutes, she presented with consistent posterior leaning, subsequently requiring cues and intermittent physical assist to correct. She reported having 7/10 RLE pain. Her goal is to be active again. She will benefit from further OT services to maximize her safety & independence with self-care tasks. Patient will benefit from continued inpatient follow up therapy, <3 hours/day.      If plan is discharge home, recommend the following:   Two people to help with walking and/or transfers;Assistance with cooking/housework;Assist for transportation;Help with stairs or ramp for entrance;A lot of help with bathing/dressing/bathroom;Direct supervision/assist for medications management     Functional Status Assessment   Patient has had a recent decline in their functional status and demonstrates the ability to make significant improvements in function in a reasonable and predictable amount of time.     Equipment Recommendations    BSC/3in1     Recommendations for Other Services         Precautions/Restrictions   Precautions Precautions: Fall Restrictions Weight Bearing Restrictions Per Provider Order: Yes RLE Weight Bearing Per Provider Order: Touchdown weight bearing     Mobility Bed Mobility Overal bed mobility: Needs Assistance Bed Mobility: Supine to Sit, Sit to Supine     Supine to sit: Min assist, Mod assist Sit to supine: Mod assist   General bed mobility comments: Patient able  to move both legs over bed edge and move trunk to sitting upright.   tends to lean posterior with cues to sit forward. Pt tolerated sitting edge of bed for ~10 minutes while performing upper body grooming,         Balance     Sitting balance-Leahy Scale: Poor Sitting balance - Comments:  (posterior leaning noted)        ADL either performed or assessed with clinical judgement   ADL Overall ADL's : Needs assistance/impaired Eating/Feeding: Set up;Bed level   Grooming: Moderate assistance;Sitting;Wash/dry face;Oral care Grooming Details (indicate cue type and reason): The pt initially presented with fair sitting balance EOB while she performed upper body grooming. After a couple minutes, she presented with posterior leaning, subsequently requiring cues and intermittent physical assist to correct while she performed face washing and teeth brushing. Upper Body Bathing: Set up;Bed level   Lower Body Bathing: Moderate assistance;Bed level   Upper Body Dressing : Minimal assistance;Bed level   Lower Body Dressing: Total assistance;Sitting/lateral leans Lower Body Dressing Details (indicate cue type and reason): She required total assist to donn socks seated EOB.     Toileting- Clothing Manipulation and Hygiene: Maximal assistance;Bed level  Vision Patient Visual Report: No change from baseline              Pertinent Vitals/Pain Pain Assessment Pain Assessment: 0-10 Pain Score: 7   Pain Location:  (R knee and hip) Pain Descriptors / Indicators: Discomfort, Aching, Grimacing Pain Intervention(s): Limited activity within patient's tolerance, Monitored during session, Repositioned     Extremity/Trunk Assessment Upper Extremity Assessment Upper Extremity Assessment: Right hand dominant;RUE deficits/detail;LUE deficits/detail;Generalized weakness RUE Deficits / Details: AROM WFL. Gross strength 4/5. She reported having occasional difficulty maintaining grasp of items at her baseline, stating she tends to drop things at times. RUE Coordination: decreased fine motor LUE Deficits / Details: AROM WFL. Gross strength 4/5. She reported having occasional difficulty maintaining grasp of items at her baseline, stating she tends to drop things at times. LUE Coordination: decreased fine motor   Lower Extremity Assessment Lower Extremity Assessment: LLE deficits/detail;RLE deficits/detail;Generalized weakness RLE Deficits / Details:  (hip AROM WFL. Decreased knee AROM, with limitations due to pain and recent fracture. Ankle AROM WFL) RLE Sensation: history of peripheral neuropathy LLE Deficits / Details: AROM WFL. LLE Sensation: history of peripheral neuropathy       Communication Communication Communication: Impaired Factors Affecting Communication: Hearing impaired   Cognition Arousal: Alert Behavior During Therapy: WFL for tasks assessed/performed               OT - Cognition Comments: Oriented x4, able to follow simple commands consistently        Following commands: Intact       Cueing  General Comments   Cueing Techniques: Verbal cues              Home Living Family/patient expects to be discharged to:: Skilled nursing facility Living Arrangements: Alone Available Help at Discharge: Family Type of Home: House Home Access: Stairs to enter Secretary/administrator of Steps: 4 Entrance Stairs-Rails: Left;Right Home Layout: One level      Bathroom Shower/Tub: Producer, television/film/video: Handicapped height     Home Equipment: Grab bars - tub/shower;Shower seat;Grab bars - toilet;Rollator (4 wheels);Cane - single point   Additional Comments: pt reported at in SNF therapy was limited, she was mostly in the bed, stated she did get into wheelchair but was not trying to stand yet       Prior Functioning/Environment Prior Level of Function : Needs assist;Independent/Modified Independent             Mobility Comments:  (She used a cane vs. rollator for ambulation.) ADLs Comments: Modified independent to independent with ADLs; uses a weekly medication planner and daughter assists with setting it up; pt receives Meals on Wheels; hired assist 1x per month for cleaning    OT Problem List: Decreased strength;Decreased activity tolerance;Impaired balance (sitting and/or standing);Decreased knowledge of use of DME or AE;Decreased knowledge of precautions;Pain;Decreased range of motion   OT Treatment/Interventions: Self-care/ADL training;Therapeutic exercise;Energy conservation;DME and/or AE instruction;Therapeutic activities;Balance training;Patient/family education      OT Goals(Current goals can be found in the care plan section)   Acute Rehab OT Goals Patient Stated Goal: to be active again. OT Goal Formulation: With patient Time For Goal Achievement: 03/13/24 Potential to Achieve Goals: Good ADL Goals Pt Will Perform Grooming: with set-up;with supervision;sitting Pt Will Perform Upper Body Bathing: with supervision;with set-up;sitting Pt Will Perform Upper Body Dressing: with supervision;with set-up;sitting Additional ADL Goal #1: The pt will perform bed mobility with supervision, in prep for progressive ADL participation.   OT Frequency:  Min  2X/week    Co-evaluation PT/OT/SLP Co-Evaluation/Treatment: Yes Reason for Co-Treatment: For patient/therapist safety;To address functional/ADL transfers PT goals  addressed during session: Mobility/safety with mobility;Balance OT goals addressed during session: ADL's and self-care      AM-PAC OT 6 Clicks Daily Activity     Outcome Measure Help from another person eating meals?: A Little Help from another person taking care of personal grooming?: A Lot Help from another person toileting, which includes using toliet, bedpan, or urinal?: A Lot Help from another person bathing (including washing, rinsing, drying)?: A Lot Help from another person to put on and taking off regular upper body clothing?: A Little Help from another person to put on and taking off regular lower body clothing?: Total 6 Click Score: 13   End of Session Equipment Utilized During Treatment: Oxygen Nurse Communication: Mobility status  Activity Tolerance: Other (comment) (Fair tolerance) Patient left: in bed;with call bell/phone within reach;with bed alarm set  OT Visit Diagnosis: Other abnormalities of gait and mobility (R26.89);Unsteadiness on feet (R26.81);History of falling (Z91.81);Muscle weakness (generalized) (M62.81);Pain Pain - Right/Left: Right Pain - part of body: Leg                Time: 8944-8875 OT Time Calculation (min): 29 min Charges:  OT General Charges $OT Visit: 1 Visit OT Evaluation $OT Eval Moderate Complexity: 1 Mod OT Treatments $Self Care/Home Management : 8-22 mins    Delanna JINNY Lesches, OTR/L 02/28/2024, 3:20 PM

## 2024-02-28 NOTE — Progress Notes (Addendum)
 Patient admitted after midnight, please see H&P by Dr. Keturah.  Unfortunately no sodium/BMP has been checked since 2 AM.  Have ordered stat BMP in hopes that we will not raise her sodium too fast.  Patient due to see orthopedic doctor today so will consult orthopedics to see while in hospital. [place] Weightbearing Touchdown weightbearing right leg for approximately 4 weeks then advance as tolerated.  Continue to use [facility] and ambulate with assistance   Called lab to find out why BMP has not yet resulted as has been pending since before 7-apparently there are some issues with their machines.  Harlene Bowl DO

## 2024-02-28 NOTE — H&P (Addendum)
 History and Physical    [name] FMW:[id number] DOB: 07/13/1933 DOA: 02/27/2024  PCP: Shepard Ade, MD   Patient coming from: Home   Chief Complaint:  Chief Complaint  Patient presents with   Abnormal Labs    HPI: Hx limited by patients poor recall  [name] is a 89 y.o. female with hx of recent periprosthetic distal R femur fracture and underwent ORIF on 9/9, and discharged to SNF on 9/16; additional hx including CAD, HFimpEF, cryptogenic / embolic CVA, with loop recorder recently detected paroxysmal Atrial fibrillation, HTN, HLD, hypothyroidism, MGUS, colonic polyps, RLS, osteoporosis, vertebral compression fractures, mood d/o, frequent falls, who was brought in from SNF due to finding of hyponatremia. Patient is awake and alert, reports recently feeling lightheaded and like she is going to pass out. Daughter reports that she has been confused especially over the past day, and saying things that dont make sense. Had not c/o Lightheadedness until now that daughter has heard. Has c/o headaches, although these have been ongoing x months and not changed recently. No seizures. Daughter reports that she has poor intake of food (previously on meals on wheels, would eat 1-2 meals per day). Note that patient frequently gets confused when dehydrated so they encourage her to drink lots of water . No recent change in medication other than decrease in Losartan  to 50 mg at night, and recent AC ppx after hip fracture.    Review of Systems:  ROS complete and negative except as marked above   No Known Allergies  Prior to Admission medications   Medication Sig Start Date End Date Taking? Authorizing Provider  acetaminophen  (TYLENOL ) 325 MG tablet Take 1-2 tablets (325-650 mg total) by mouth every 6 (six) hours as needed for mild pain (pain score 1-3) or fever (or temp > 100.5). 02/08/24  Yes Deward Eck, PA-C  ALPRAZolam  (XANAX ) 0.5 MG tablet Take 0.25 mg by mouth 3 (three) times daily as  needed (restless legs).   Yes [provider]  apixaban  (ELIQUIS ) 2.5 MG TABS tablet Take 1 tablet (2.5 mg total) by mouth 2 (two) times daily. 02/08/24 03/09/24 Yes Deward Eck, PA-C  Ascorbic Acid (VITAMIN C PO) Take 500 mg by mouth daily.   Yes [provider]  aspirin  EC 81 MG tablet Take 1 tablet (81 mg total) by mouth daily. Swallow whole. 02/20/23  Yes Weaver, Scott T, PA-C  Calcium  Carbonate-Vitamin D  (CALCIUM -D PO) Take 1 tablet by mouth daily.   Yes [provider]  dexlansoprazole  (DEXILANT ) 60 MG capsule Take 60 mg by mouth every morning.   Yes [provider]  escitalopram  (LEXAPRO ) 10 MG tablet Take 10 mg by mouth in the morning and at bedtime.   Yes [provider]  HYDROcodone -acetaminophen  (NORCO/VICODIN) 5-325 MG tablet Take 1 tablet by mouth every 4 (four) hours as needed for moderate pain (pain score 4-6). 02/08/24  Yes Deward Eck, PA-C  levothyroxine  (SYNTHROID , LEVOTHROID) 25 MCG tablet Take 25 mcg by mouth daily before breakfast.    Yes [provider]  losartan  (COZAAR ) 100 MG tablet Take 1 tablet (100 mg total) by mouth daily. Take once daily at bedtime Patient taking differently: Take 50 mg by mouth at bedtime. 10/04/23  Yes Thukkani, Arun K, MD  metoprolol  succinate (TOPROL -XL) 50 MG 24 hr tablet Take 1.5 tablets (75 mg total) by mouth daily. Take with or immediately following a meal. 12/28/22  Yes Cindie Ole DASEN, MD  Multiple Vitamin (MULTIVITAMIN WITH MINERALS) TABS tablet Take 1  tablet by mouth daily.   Yes [provider]  polyethylene glycol (MIRALAX  / GLYCOLAX ) 17 g packet Take 17 g by mouth every other day.   Yes [provider]  Probiotic Product (ALIGN) 4 MG CAPS Take 1 capsule by mouth daily.   Yes [provider]  pyridoxine  (B-6) 500 MG tablet Take 1 tablet (500 mg total) by mouth daily. 02/24/22  Yes Ennever, Maude SAUNDERS, MD  rosuvastatin  (CRESTOR ) 20 MG tablet Take 1 tablet (20 mg  total) by mouth daily. 04/22/21  Yes Jerri Keys, MD  sennosides-docusate sodium  (SENOKOT-S) 8.6-50 MG tablet Take 2 tablets by mouth at bedtime.   Yes [provider]  spironolactone  (ALDACTONE ) 25 MG tablet TAKE 1 TABLET (25 MG TOTAL) BY MOUTH DAILY. 12/11/23  Yes Thukkani, Arun K, MD  Vitamin D , Ergocalciferol , (DRISDOL ) 50000 UNITS CAPS Take 50,000 Units by mouth every Friday.   Yes [provider]  VITAMIN E PO Take 400 Units by mouth daily.   Yes [provider]  zolpidem  (AMBIEN  CR) 12.5 MG CR tablet Take 12.5 mg by mouth at bedtime.   Yes [provider]    Past Medical History:  Diagnosis Date   Anxiety    Arthritis    Chronic headaches    hx - now just occasional HA per patient   Colon polyps    Complication of anesthesia    Elevated blood pressure after having last Kyphoplasty; pt stated I was given Morphine  and had to stay overnight   Coronary artery disease    Depression    DI (detrusor instability)    Dilated cardiomyopathy (HCC) 10/04/2022   TTE 10/03/22: EF 42, mild MR, ascending aorta 38 mm   Fracture of vertebra    x 3   GERD (gastroesophageal reflux disease)    Heart failure with mildly reduced ejection fraction (HFmrEF) (HCC) 01/18/2023   - CCTA 10/07/2006: CAC score 1; LAD < 30% soft plaque   - Myoview 03/30/2015: EF 83, no ischemia; low risk  - TTE 04/21/2021: EF 60-65, no RWMA, Gr 1 DD, normal RVSF, trivial MR, trivial AI - TTE 10/03/22: EF 42, global HK, normal RVSF, mild MR, ascending aorta dilation (38 mm), RAP 3    History of blood transfusion 2011   w/ knee replacement surgery   History of hiatal hernia    small - does not cause any problems per patient   HLD (hyperlipidemia)    Hypertension    Hypothyroidism    Menopausal symptoms    MGUS (monoclonal gammopathy of unknown significance) 12/27/2017   IgA   Osteoporosis    Peripheral neuropathy 12/27/2017   Pneumonia    Restless leg syndrome    Stroke (HCC) 03/2021    mini strokes   TMJ click    Varicose veins     Past Surgical History:  Procedure Laterality Date   ABDOMINAL HYSTERECTOMY  1992   TAH,BSO   BLADDER SUSPENSION  2011   CRYOMESH   CARPAL TUNNEL RELEASE Bilateral 1982   CHOLECYSTECTOMY  1992   COLONOSCOPY     EYE SURGERY Bilateral    Cataract removal   IR THORACENTESIS ASP PLEURAL SPACE W/IMG GUIDE  05/28/2021   KYPHOPLASTY     X 3   KYPHOPLASTY N/A 08/02/2021   Procedure: Thoracic Twelve KYPHOPLASTY;  Surgeon: Colon Shove, MD;  Location: [facility] OR;  Service: Neurosurgery;  Laterality: N/A;   LUMBAR LAMINECTOMY WITH COFLEX 1 LEVEL N/A 12/29/2015   Procedure: L3-4 Laminectomy with Coflex;  Surgeon: Victory Gens, MD;  Location: [facility] NEURO ORS;  Service: Neurosurgery;  Laterality: N/A;  L3-4 Laminectomy with coflex   OOPHORECTOMY     BSO   ORIF FEMUR FRACTURE Right 02/06/2024   Procedure: OPEN REDUCTION INTERNAL FIXATION (ORIF) DISTAL FEMUR FRACTURE;  Surgeon: Celena Sharper, MD;  Location: [facility] OR;  Service: Orthopedics;  Laterality: Right;  ORIF RIGHT SUPRACONDYLAR FEMUR   REPLACEMENT TOTAL KNEE Bilateral 2008,  2011   RIGHT 2008, LEFT 2011   REVERSE SHOULDER ARTHROPLASTY Right 01/30/2020   Procedure: REVERSE SHOULDER ARTHROPLASTY;  Surgeon: Sharl Selinda Dover, MD;  Location: [facility] ORS;  Service: Orthopedics;  Laterality: Right;  2.5 hrs   SPINAL FUSION  2011   VERTEBRAL SURGERY     T-8, T-10. T-12  DR CHRISS     reports that she has never smoked. She has never used smokeless tobacco. She reports that she does not drink alcohol  and does not use drugs.  Family History  Problem Relation Age of Onset   Heart disease Mother    Heart disease Father    Stroke Father    Hypertension Sister    Diabetes Sister    Ovarian cancer Sister    Diabetes Sister    Rectal cancer Sister        ? Colon or rectal   Hyperlipidemia Sister    Hypertension Sister    Hyperlipidemia Sister    Hypertension Sister    Hypertension Brother    Heart disease  Brother    Heart disease Brother    Hypertension Brother    Heart disease Brother    Heart disease Brother    Heart disease Brother    Heart disease Brother    Breast cancer Niece    Heart disease Son    Hyperlipidemia Son    Hyperlipidemia Daughter    Hypertension Daughter      Physical Exam: Vitals:   02/27/24 2224 02/28/24 0000 02/28/24 0230 02/28/24 0301  BP: (!) 167/91 (!) 119/107 138/82   Pulse: 91 85 91   Resp: 18 15 (!) 21   Temp: 98.3 F (36.8 C)   98.6 F (37 C)  TempSrc: Oral   Oral  SpO2: 100% 98% 99%   Weight:      Height:        Gen: Awake, alert, NAD, chronically ill appearing.   CV: Regular, normal S1, S2, no murmurs  Resp: Normal WOB, CTAB  Abd: Flat, normoactive, nontender MSK: Symmetric, Trace edema at ankles  Skin: ecchymosis / venous stasis changes in LE. No rashes or lesions to exposed skin  Neuro: Alert and interactive, fully oriented  Psych: euthymic, appropriate    Data review:   Labs reviewed, notable for:   Na 116 -> 120 (after NS 500 cc) sOsm pending, uOsm 145 (after IVF), and uNa < 30   Cl 82  Alk phos 127, other LFT wnl  Pro BNP 596  HS trop 24 -> 26  Hb 11; up from recent baseline  TSH 1.9  UA with rare bacteria, no pyuria     Micro:  Results for orders placed or performed during the hospital encounter of 01/19/24  Resp panel by RT-PCR (RSV, Flu A&B, Covid) Anterior Nasal Swab     Status: None   Collection Time: 01/19/24  3:59 PM   Specimen: Anterior Nasal Swab  Result Value Ref Range Status   SARS Coronavirus 2 by RT PCR NEGATIVE NEGATIVE Final   Influenza A by PCR NEGATIVE NEGATIVE Final  Influenza B by PCR NEGATIVE NEGATIVE Final    Comment: (NOTE) The Xpert Xpress SARS-CoV-2/FLU/RSV plus assay is intended as an aid in the diagnosis of influenza from Nasopharyngeal swab specimens and should not be used as a sole basis for treatment. Nasal washings and aspirates are unacceptable for Xpert Xpress  SARS-CoV-2/FLU/RSV testing.  Fact Sheet for Patients: BloggerCourse.com  Fact Sheet for Healthcare Providers: SeriousBroker.it  This test is not yet approved or cleared by the [place]  FDA and has been authorized for detection and/or diagnosis of SARS-CoV-2 by FDA under an Emergency Use Authorization (EUA). This EUA will remain in effect (meaning this test can be used) for the duration of the COVID-19 declaration under Section 564(b)(1) of the Act, 21 U.S.C. section 360bbb-3(b)(1), unless the authorization is terminated or revoked.     Resp Syncytial Virus by PCR NEGATIVE NEGATIVE Final    Comment: (NOTE) Fact Sheet for Patients: BloggerCourse.com  Fact Sheet for Healthcare Providers: SeriousBroker.it  This test is not yet approved or cleared by the [place]  FDA and has been authorized for detection and/or diagnosis of SARS-CoV-2 by FDA under an Emergency Use Authorization (EUA). This EUA will remain in effect (meaning this test can be used) for the duration of the COVID-19 declaration under Section 564(b)(1) of the Act, 21 U.S.C. section 360bbb-3(b)(1), unless the authorization is terminated or revoked.  Performed at [facility], [address] [place], [place] [address]     Imaging reviewed:  CT Angio Chest PE W/Cm &/Or Wo Cm Result Date: 02/28/2024 EXAM: CTA CHEST 02/28/2024 01:22:42 AM TECHNIQUE: CTA of the chest was performed without and with the administration of 75 mL of intravenous contrast (iohexol  (OMNIPAQUE ) 350 MG/ML injection 75 mL IOHEXOL  350 MG/ML SOLN). Multiplanar reformatted images are provided for review. MIP images are provided for review. Automated exposure control, iterative reconstruction, and/or weight based adjustment of the mA/kV was utilized to reduce the radiation dose to as low as reasonably achievable. COMPARISON: Same-day chest  radiograph and CT 01/03/2023. CLINICAL HISTORY: Pulmonary embolism (PE) suspected, high prob. Abnormal sodium levels, some chest heaviness, has improved per pt; 75 ml omni 350. FINDINGS: PULMONARY ARTERIES: Pulmonary arteries are adequately opacified for evaluation. Negative for pulmonary embolism. Main pulmonary artery is normal in caliber. MEDIASTINUM: The heart and pericardium demonstrate no acute abnormality. Coronary artery and aortic atherosclerotic calcifications. Large hiatal hernia. LYMPH NODES: No mediastinal, hilar or axillary lymphadenopathy. LUNGS AND PLEURA: Peribronchovascular mild ground-glass opacities throughout both lungs. Findings favor atypical infection, though edema could appear similarly. No evidence of pleural effusion or pneumothorax. UPPER ABDOMEN: Limited images of the upper abdomen are unremarkable. SOFT TISSUES AND BONES: Right shoulder arthroplasty. Multiple thoracic vertebral body compression fractures status post vertebroplasties. IMPRESSION: 1. No pulmonary embolism. 2. Peribronchovascular mild ground-glass opacities throughout both lungs, favoring atypical infection; pulmonary edema could appear similarly. 3. Large hiatal hernia. Electronically signed by: Norman Gatlin MD 02/28/2024 01:32 AM EDT RP Workstation: [id number]   CT Head Wo Contrast Result Date: 02/28/2024 CLINICAL DATA:  Head and neck trauma EXAM: CT HEAD WITHOUT CONTRAST CT CERVICAL SPINE WITHOUT CONTRAST TECHNIQUE: Multidetector CT imaging of the head and cervical spine was performed following the standard protocol without intravenous contrast. Multiplanar CT image reconstructions of the cervical spine were also generated. RADIATION DOSE REDUCTION: This exam was performed according to the departmental dose-optimization program which includes automated exposure control, adjustment of the mA and/or kV according to patient size and/or use of iterative reconstruction technique. COMPARISON:  CT brain and  cervical spine  01/19/2024, MRI 09/01/2023, CT 01/03/2023 FINDINGS: CT HEAD FINDINGS Brain: No acute territorial infarction, hemorrhage or intracranial mass. Atrophy and moderate chronic small vessel ischemic changes of the white matter. Stable ventricle size. Chronic lacunar infarct at the right caudate. Vascular: No hyperdense vessels.  Carotid vascular calcification Skull: Normal. Negative for fracture or focal lesion. Sinuses/Orbits: No acute finding. Other: None CT CERVICAL SPINE FINDINGS Alignment: Straightening of the cervical spine. Trace anterolisthesis C4 on C5. Facet alignment is within normal limits. Skull base and vertebrae: No acute fracture. No primary bone lesion or focal pathologic process. Soft tissues and spinal canal: No prevertebral fluid or swelling. No visible canal hematoma. Disc levels: Advanced C1-C2 degenerative changes. Interbody devices C5-C6 and C6-C7. Moderate disc space narrowing CT C4 and C4-C5. Multilevel facet degenerative changes. Upper chest: Lung apices are clear Other: None IMPRESSION: 1. No CT evidence for acute intracranial abnormality. Atrophy and chronic small vessel ischemic changes of the white matter 2. No acute osseous abnormality of the cervical spine. Electronically Signed   By: Luke Bun M.D.   On: 02/28/2024 00:04   CT Cervical Spine Wo Contrast Result Date: 02/28/2024 CLINICAL DATA:  Head and neck trauma EXAM: CT HEAD WITHOUT CONTRAST CT CERVICAL SPINE WITHOUT CONTRAST TECHNIQUE: Multidetector CT imaging of the head and cervical spine was performed following the standard protocol without intravenous contrast. Multiplanar CT image reconstructions of the cervical spine were also generated. RADIATION DOSE REDUCTION: This exam was performed according to the departmental dose-optimization program which includes automated exposure control, adjustment of the mA and/or kV according to patient size and/or use of iterative reconstruction technique. COMPARISON:  CT brain and cervical  spine 01/19/2024, MRI 09/01/2023, CT 01/03/2023 FINDINGS: CT HEAD FINDINGS Brain: No acute territorial infarction, hemorrhage or intracranial mass. Atrophy and moderate chronic small vessel ischemic changes of the white matter. Stable ventricle size. Chronic lacunar infarct at the right caudate. Vascular: No hyperdense vessels.  Carotid vascular calcification Skull: Normal. Negative for fracture or focal lesion. Sinuses/Orbits: No acute finding. Other: None CT CERVICAL SPINE FINDINGS Alignment: Straightening of the cervical spine. Trace anterolisthesis C4 on C5. Facet alignment is within normal limits. Skull base and vertebrae: No acute fracture. No primary bone lesion or focal pathologic process. Soft tissues and spinal canal: No prevertebral fluid or swelling. No visible canal hematoma. Disc levels: Advanced C1-C2 degenerative changes. Interbody devices C5-C6 and C6-C7. Moderate disc space narrowing CT C4 and C4-C5. Multilevel facet degenerative changes. Upper chest: Lung apices are clear Other: None IMPRESSION: 1. No CT evidence for acute intracranial abnormality. Atrophy and chronic small vessel ischemic changes of the white matter 2. No acute osseous abnormality of the cervical spine. Electronically Signed   By: Luke Bun M.D.   On: 02/28/2024 00:04   DG Chest Port 1 View Result Date: 02/27/2024 CLINICAL DATA:  Chest pain. EXAM: PORTABLE CHEST 1 VIEW COMPARISON:  Radiograph 02/06/2024 FINDINGS: The cardiomediastinal contours are stable. Left chest wall loop recorder. The lungs are clear. Pulmonary vasculature is normal. No consolidation, pleural effusion, or pneumothorax. Reverse right shoulder arthroplasty. No acute osseous abnormalities are seen. IMPRESSION: No acute chest findings. Electronically Signed   By: Andrea Gasman M.D.   On: 02/27/2024 23:55    EKG:  SR with LVH, likely repolarization change anteriorly, PRWP, no acute ischemic changes.    ED Course:   Treated with 500 cc NS with  improving Na.    Assessment/Plan:  89 y.o. female with hx recent periprosthetic distal R femur  fracture and underwent ORIF on 9/9, and discharged to SNF on 9/16; additional hx including CAD, HFimpEF, cryptogenic / embolic CVA, with loop recorder recently detected paroxysmal Atrial fibrillation, HTN, HLD, hypothyroidism, MGUS, colonic polyps, RLS, osteoporosis / fragility fractures, mood d/o, frequent falls, who was brought in from SNF due to finding of hyponatremia.   Severe hyponatremia, likely subacute onset,  symptomatic with mild metabolic encephalopathy - improving  Consistent with hypovolemia with low solute  Recent decreased food intake, but has been pushing water . 1 day of confusion, suspect symptomatic with hypoNa. More chronic headaches, doubt related. Na 116 -> 120 (after NS 500 cc); at around 4 hr mark. sOsm pending, uOsm 145 (after IVF), and uNa < 30. TSH wnl. Overall labs and response to IVF consistent with hypovolemia with low solute.  -- Anticipate she will continue slowly correcting for now; hold on mIVF.  -- Continue Na check q 4 hr for now; next at 7AM. Can add slow NS if she plateaus  -- Allow for diet, ok to add salt for now, but restrict water   -- Hold home spironolactone    Recent periprosthetic distal R femur fracture s/p ORIF on 9/9 -- Continue Eliquis  2.5 mg BID for DVT ppx  -- Continue PT / OT while inpatient.  -- About due for f/u with orthopedics, can see if they can see her in hospital   Recently detected paroxysmal Afib on loop recorder  Hx of cryptogenic / embolic appearing strokes  -- Currently on Anticoagulation with Apixaban  2.5 mg BID (therapeutic dose with her age / wt for Afib)  -- May continue on [facility] per above, although caution considering her hx of recurrent falls  -- Stop aspirin  since to continue on DOAC -- Continue home statin   -- Continue home Metoprolol  for Afib/ HF  -- To establish with Afib clinic OP   Chronic medical problems:  CAD: On  DOAC, statin.  HFimpEF: Without acute exacerbation. OK to add salt for now; hypovolemic / low solute. Once correcting, can restrict again. Not on diuretics OP. Continue home Metoprolol . Hold losartan  as she appears volume down. holding spironolactone  with hypoNa.  HTN: See HFimpEF  HLD: See CVA  Hypothyroidism: Continue home levothyroxine ; TSH wnl  MGUS: Follows with heme/onc OP  colonic polyps: Has aged out of colonoscopies; last in '22 without polyps  RLS: Noted  Osteoporosis/ fragility fractures:  On high dose Vit D; continue, F/u OP  Frequent falls: PT/OT per above. Fall precautions / delirium precautions  Mood d/o: Continue home escitalopram , Xanax  prn  Insomnia: Hold ambien  with her recent AMS    Body mass index is 23.24 kg/m.    DVT prophylaxis:  Eliquis  Code Status:  Full Code Diet:  Diet Orders (From admission, onward)    None      Family Communication:  None   Consults:  None  Admission status:   Inpatient, Step Down Unit  Severity of Illness: The appropriate patient status for this patient is INPATIENT. Inpatient status is judged to be reasonable and necessary in order to provide the required intensity of service to ensure the patient's safety. The patient's presenting symptoms, physical exam findings, and initial radiographic and laboratory data in the context of their chronic comorbidities is felt to place them at high risk for further clinical deterioration. Furthermore, it is not anticipated that the patient will be medically stable for discharge from the hospital within 2 midnights of admission.   * I certify that at the point of admission it  is my clinical judgment that the patient will require inpatient hospital care spanning beyond 2 midnights from the point of admission due to high intensity of service, high risk for further deterioration and high frequency of surveillance required.*   Dorn Dawson, MD [facility]  How to contact the [facility] Attending  or Consulting provider 7A - 7P or covering provider during after hours 7P -7A, for this patient.  Check the care team in [facility] and look for a) attending/consulting [facility] provider listed and b) the [facility] team listed Log into [email or web address] and use [facility] universal password to access. If you do not have the password, please contact the hospital operator. Locate the [facility] provider you are looking for under [facility] and page to a number that you can be directly reached. If you still have difficulty reaching the provider, please page the [facility] (Director on Call) for the Hospitalists listed on amion for assistance.  02/28/2024, 4:46 AM

## 2024-02-29 DIAGNOSIS — E871 Hypo-osmolality and hyponatremia: Secondary | ICD-10-CM | POA: Diagnosis not present

## 2024-02-29 LAB — CBC
HCT: 27.3 % — ABNORMAL LOW (ref 36.0–46.0)
Hemoglobin: 9 g/dL — ABNORMAL LOW (ref 12.0–15.0)
MCH: 33.7 pg (ref 26.0–34.0)
MCHC: 33 g/dL (ref 30.0–36.0)
MCV: 102.2 fL — ABNORMAL HIGH (ref 80.0–100.0)
Platelets: 251 K/uL (ref 150–400)
RBC: 2.67 MIL/uL — ABNORMAL LOW (ref 3.87–5.11)
RDW: 15.2 % (ref 11.5–15.5)
WBC: 6.5 K/uL (ref 4.0–10.5)
nRBC: 0 % (ref 0.0–0.2)

## 2024-02-29 LAB — BASIC METABOLIC PANEL WITH GFR
Anion gap: 8 (ref 5–15)
Anion gap: 9 (ref 5–15)
Anion gap: 8 (ref 5–15)
Anion gap: 8 (ref 5–15)
Anion gap: 9 (ref 5–15)
Anion gap: 8 (ref 5–15)
Anion gap: 11 (ref 5–15)
BUN: 15 mg/dL (ref 8–23)
BUN: 14 mg/dL (ref 8–23)
BUN: 12 mg/dL (ref 8–23)
BUN: 12 mg/dL (ref 8–23)
BUN: 15 mg/dL (ref 8–23)
BUN: 16 mg/dL (ref 8–23)
BUN: 15 mg/dL (ref 8–23)
CO2: 23 mmol/L (ref 22–32)
CO2: 22 mmol/L (ref 22–32)
CO2: 24 mmol/L (ref 22–32)
CO2: 24 mmol/L (ref 22–32)
CO2: 23 mmol/L (ref 22–32)
CO2: 23 mmol/L (ref 22–32)
CO2: 21 mmol/L — ABNORMAL LOW (ref 22–32)
Calcium: 9.1 mg/dL (ref 8.9–10.3)
Calcium: 8.8 mg/dL — ABNORMAL LOW (ref 8.9–10.3)
Calcium: 9.3 mg/dL (ref 8.9–10.3)
Calcium: 9.2 mg/dL (ref 8.9–10.3)
Calcium: 8.8 mg/dL — ABNORMAL LOW (ref 8.9–10.3)
Calcium: 8.8 mg/dL — ABNORMAL LOW (ref 8.9–10.3)
Calcium: 8.8 mg/dL — ABNORMAL LOW (ref 8.9–10.3)
Chloride: 91 mmol/L — ABNORMAL LOW (ref 98–111)
Chloride: 91 mmol/L — ABNORMAL LOW (ref 98–111)
Chloride: 92 mmol/L — ABNORMAL LOW (ref 98–111)
Chloride: 91 mmol/L — ABNORMAL LOW (ref 98–111)
Chloride: 92 mmol/L — ABNORMAL LOW (ref 98–111)
Chloride: 93 mmol/L — ABNORMAL LOW (ref 98–111)
Chloride: 95 mmol/L — ABNORMAL LOW (ref 98–111)
Creatinine, Ser: 0.49 mg/dL (ref 0.44–1.00)
Creatinine, Ser: 0.47 mg/dL (ref 0.44–1.00)
Creatinine, Ser: 0.48 mg/dL (ref 0.44–1.00)
Creatinine, Ser: 0.46 mg/dL (ref 0.44–1.00)
Creatinine, Ser: 0.5 mg/dL (ref 0.44–1.00)
Creatinine, Ser: 0.54 mg/dL (ref 0.44–1.00)
Creatinine, Ser: 0.49 mg/dL (ref 0.44–1.00)
GFR, Estimated: >60 mL/min (ref >60)
GFR, Estimated: >60 mL/min (ref >60)
GFR, Estimated: >60 mL/min (ref >60)
GFR, Estimated: >60 mL/min (ref >60)
GFR, Estimated: >60 mL/min (ref >60)
GFR, Estimated: >60 mL/min (ref >60)
GFR, Estimated: >60 mL/min (ref >60)
Glucose, Bld: 107 mg/dL — ABNORMAL HIGH (ref 70–99)
Glucose, Bld: 104 mg/dL — ABNORMAL HIGH (ref 70–99)
Glucose, Bld: 100 mg/dL — ABNORMAL HIGH (ref 70–99)
Glucose, Bld: 93 mg/dL (ref 70–99)
Glucose, Bld: 139 mg/dL — ABNORMAL HIGH (ref 70–99)
Glucose, Bld: 114 mg/dL — ABNORMAL HIGH (ref 70–99)
Glucose, Bld: 136 mg/dL — ABNORMAL HIGH (ref 70–99)
Potassium: 4.9 mmol/L (ref 3.5–5.1)
Potassium: 4.7 mmol/L (ref 3.5–5.1)
Potassium: 4.5 mmol/L (ref 3.5–5.1)
Potassium: 4.7 mmol/L (ref 3.5–5.1)
Potassium: 4.2 mmol/L (ref 3.5–5.1)
Potassium: 4.6 mmol/L (ref 3.5–5.1)
Potassium: 4.3 mmol/L (ref 3.5–5.1)
Sodium: 123 mmol/L — ABNORMAL LOW (ref 135–145)
Sodium: 122 mmol/L — ABNORMAL LOW (ref 135–145)
Sodium: 124 mmol/L — ABNORMAL LOW (ref 135–145)
Sodium: 123 mmol/L — ABNORMAL LOW (ref 135–145)
Sodium: 124 mmol/L — ABNORMAL LOW (ref 135–145)
Sodium: 125 mmol/L — ABNORMAL LOW (ref 135–145)
Sodium: 127 mmol/L — ABNORMAL LOW (ref 135–145)

## 2024-02-29 LAB — PHOSPHORUS: Phosphorus: 3.4 mg/dL (ref 2.5–4.6)

## 2024-02-29 LAB — MAGNESIUM: Magnesium: 2 mg/dL (ref 1.7–2.4)

## 2024-02-29 MED ORDER — HYDRALAZINE HCL 20 MG/ML IJ SOLN
5.0000 mg | Freq: Four times a day (QID) | INTRAMUSCULAR | Status: DC | PRN
  Administered 2024-03-01 – 2024-03-02 (×2): 5 mg via INTRAVENOUS
  Filled 2024-02-29 (×2): qty 1

## 2024-02-29 MED ORDER — SODIUM CHLORIDE 0.9 % IV SOLN: INTRAVENOUS | Status: AC

## 2024-02-29 MED ORDER — ORAL CARE MOUTH RINSE: 15.0000 mL | OROMUCOSAL | Status: DC | PRN

## 2024-02-29 NOTE — Progress Notes (Signed)
 PROGRESS NOTE    [name]  FMW:[id number] DOB: 1934-02-01 DOA: 02/27/2024 PCP: Shepard Ade, MD    Brief Narrative:  89 y.o. female with hx recent periprosthetic distal R femur fracture and underwent ORIF on 9/9, and discharged to SNF on 9/16; additional hx including CAD, HFimpEF, cryptogenic / embolic CVA, with loop recorder recently detected paroxysmal Atrial fibrillation, HTN, HLD, hypothyroidism, MGUS, colonic polyps, RLS, osteoporosis / fragility fractures, mood d/o, frequent falls, who was brought in from SNF due to finding of hyponatremia.    Assessment and Plan: Severe hyponatremia, likely subacute onset,  symptomatic with mild metabolic encephalopathy - improving  Consistent with hypovolemia with low solute  Recent decreased food intake, but has been pushing water . 1 day of confusion, suspect symptomatic with hypoNa. More chronic headaches, doubt related. Na 116 -> 120 (after NS 500 cc); at around 4 hr mark. sOsm pending, uOsm 145 (after IVF), and uNa < 30. TSH wnl. Overall labs and response to IVF consistent with hypovolemia with low solute.  -- Continue Na check q 4 hr for now; add IV fluids -- Allow for diet, ok to add salt for now, but restrict water   -- Monitoring so she does not increase too quickly (had to use some D5 yesterday to slow correction)   Recent periprosthetic distal R femur fracture s/p ORIF on 9/9 -- Continue Eliquis  2.5 mg BID for DVT ppx  -- Continue PT / OT while inpatient.  -- Discussed with PA Keith-he has ordered imaging and will follow-up in the office in 1 week to remove the sutures   Recently detected paroxysmal Afib on loop recorder  Hx of cryptogenic / embolic appearing strokes  -- Currently on Anticoagulation with Apixaban  2.5 mg BID (therapeutic dose with her age / wt for Afib)  -- May continue on [facility] per above, although caution considering her hx of recurrent falls  -- Stop aspirin  since to continue on DOAC -- Continue home statin    -- Continue home Metoprolol  for Afib/ HF  -- Can establish with Afib clinic OP    Chronic medical problems:  CAD: On DOAC, statin.  HFimpEF: Without acute exacerbation. OK to add salt for now; hypovolemic / low solute. Once correcting, can restrict again. Not on diuretics OP. Continue home Metoprolol . Hold losartan  as she appears volume down. holding spironolactone  with hypoNa.   Hypothyroidism: Continue home levothyroxine ; TSH wnl  MGUS: Follows with heme/onc OP -appears Dr. Timmy has ordered a 24-hour urine for UPEP/light chains colonic polyps: Has aged out of colonoscopies; last in '22 without polyps  RLS: Noted  Osteoporosis/ fragility fractures:  On high dose Vit D; continue, F/u OP  Frequent falls: PT/OT per above. Fall precautions / delirium precautions  [place] d/o: Continue home escitalopram , Xanax  prn     DVT prophylaxis: apixaban  (ELIQUIS ) tablet 2.5 mg Start: 02/28/24 1000 apixaban  (ELIQUIS ) tablet 2.5 mg    Code Status: Full Code Family Communication:   Disposition Plan:  Level of care: Stepdown Status is: Inpatient     Consultants:  Orthopedics (phone)   Subjective: Anxious return to skilled nursing facility is able to hold her bed for much longer  Objective: Vitals:   02/29/24 1100 02/29/24 1130 02/29/24 1158 02/29/24 1200  BP: (!) 140/81   127/63  Pulse: 82 87  86  Resp: 15 16  (!) 27  Temp:   98.1 F (36.7 C)   TempSrc:   Oral   SpO2: 99% 99%  97%  Weight:  Height:        Intake/Output Summary (Last 24 hours) at 02/29/2024 1254 Last data filed at 02/29/2024 1156 Gross per 24 hour  Intake 1473.79 ml  Output 550 ml  Net 923.79 ml   Filed Weights   02/27/24 2223  Weight: 54 kg    Examination:   General: Appearance:    Well developed, well nourished female in no acute distress     Lungs:     Clear to auscultation bilaterally, respirations unlabored  Heart:    Normal heart rate.     MS:   All extremities are intact.    Neurologic:    Awake, alert, cannot ramble at times       Data Reviewed: I have personally reviewed following labs and imaging studies  CBC: Recent Labs  Lab 02/27/24 2240 02/29/24 0327  WBC 8.0 6.5  NEUTROABS 5.8  --   HGB 11.5* 9.0*  HCT 34.1* 27.3*  MCV 97.4 102.2*  PLT 320 251   Basic Metabolic Panel: Recent Labs  Lab 02/29/24 0138 02/29/24 0327 02/29/24 0537 02/29/24 0729 02/29/24 1156  NA 123* 122* 124* 123* 124*  K 4.9 4.7 4.5 4.7 4.2  CL 91* 91* 92* 91* 92*  CO2 23 22 24 24 23   GLUCOSE 107* 104* 100* 93 139*  BUN 15 14 12 12 15   CREATININE 0.49 0.47 0.48 0.46 0.50  CALCIUM  9.1 8.8* 9.3 9.2 8.8*  MG  --  2.0  --   --   --   PHOS  --  3.4  --   --   --    GFR: Estimated Creatinine Clearance: 34.9 mL/min (by C-G formula based on SCr of 0.5 mg/dL). Liver Function Tests: Recent Labs  Lab 02/27/24 2348  AST 27  ALT 21  ALKPHOS 127*  BILITOT 0.8  PROT 6.0*  ALBUMIN 3.8   No results for input(s): LIPASE, AMYLASE in the last 168 hours. No results for input(s): AMMONIA in the last 168 hours. Coagulation Profile: No results for input(s): INR, PROTIME in the last 168 hours. Cardiac Enzymes: No results for input(s): CKTOTAL, CKMB, CKMBINDEX, TROPONINI in the last 168 hours. BNP (last 3 results) Recent Labs    02/28/24 0258  PROBNP 596.0*   HbA1C: No results for input(s): HGBA1C in the last 72 hours. CBG: No results for input(s): GLUCAP in the last 168 hours. Lipid Profile: No results for input(s): CHOL, HDL, LDLCALC, TRIG, CHOLHDL, LDLDIRECT in the last 72 hours. Thyroid  Function Tests: Recent Labs    02/28/24 0258  TSH 1.930   Anemia Panel: No results for input(s): VITAMINB12, FOLATE, FERRITIN, TIBC, IRON, RETICCTPCT in the last 72 hours. Sepsis Labs: No results for input(s): PROCALCITON, LATICACIDVEN in the last 168 hours.  Recent Results (from the past 240 hours)  MRSA Next Gen by PCR, Nasal      Status: None   Collection Time: 02/28/24  3:55 PM   Specimen: Nasal Mucosa; Nasal Swab  Result Value Ref Range Status   MRSA by PCR Next Gen NOT DETECTED NOT DETECTED Final    Comment: (NOTE) The GeneXpert MRSA Assay (FDA approved for NASAL specimens only), is one component of a comprehensive MRSA colonization surveillance program. It is not intended to diagnose MRSA infection nor to guide or monitor treatment for MRSA infections. Test performance is not FDA approved in patients less than 3 years old. Performed at [facility], [address] [place], [place] [address]  Radiology Studies: DG Knee Right Port Result Date: 02/28/2024 CLINICAL DATA:  Right knee fracture. EXAM: PORTABLE RIGHT KNEE - 1-2 VIEW COMPARISON:  February 06, 2024. FINDINGS: Status post right total knee arthroplasty as well as surgical internal fixation of mildly displaced oblique fracture of distal right femoral shaft just above prosthesis. No significant callus formation is noted. IMPRESSION: Status post surgical internal fixation of distal right femoral fracture as described above. Electronically Signed   By: Lynwood Landy Raddle M.D.   On: 02/28/2024 10:55   CT Angio Chest PE W/Cm &/Or Wo Cm Result Date: 02/28/2024 EXAM: CTA CHEST 02/28/2024 01:22:42 AM TECHNIQUE: CTA of the chest was performed without and with the administration of 75 mL of intravenous contrast (iohexol  (OMNIPAQUE ) 350 MG/ML injection 75 mL IOHEXOL  350 MG/ML SOLN). Multiplanar reformatted images are provided for review. MIP images are provided for review. Automated exposure control, iterative reconstruction, and/or weight based adjustment of the mA/kV was utilized to reduce the radiation dose to as low as reasonably achievable. COMPARISON: Same-day chest radiograph and CT 01/03/2023. CLINICAL HISTORY: Pulmonary embolism (PE) suspected, high prob. Abnormal sodium levels, some chest heaviness, has improved per pt; 75 ml omni  350. FINDINGS: PULMONARY ARTERIES: Pulmonary arteries are adequately opacified for evaluation. Negative for pulmonary embolism. Main pulmonary artery is normal in caliber. MEDIASTINUM: The heart and pericardium demonstrate no acute abnormality. Coronary artery and aortic atherosclerotic calcifications. Large hiatal hernia. LYMPH NODES: No mediastinal, hilar or axillary lymphadenopathy. LUNGS AND PLEURA: Peribronchovascular mild ground-glass opacities throughout both lungs. Findings favor atypical infection, though edema could appear similarly. No evidence of pleural effusion or pneumothorax. UPPER ABDOMEN: Limited images of the upper abdomen are unremarkable. SOFT TISSUES AND BONES: Right shoulder arthroplasty. Multiple thoracic vertebral body compression fractures status post vertebroplasties. IMPRESSION: 1. No pulmonary embolism. 2. Peribronchovascular mild ground-glass opacities throughout both lungs, favoring atypical infection; pulmonary edema could appear similarly. 3. Large hiatal hernia. Electronically signed by: Norman Gatlin MD 02/28/2024 01:32 AM EDT RP Workstation: [id number]   CT Head Wo Contrast Result Date: 02/28/2024 CLINICAL DATA:  Head and neck trauma EXAM: CT HEAD WITHOUT CONTRAST CT CERVICAL SPINE WITHOUT CONTRAST TECHNIQUE: Multidetector CT imaging of the head and cervical spine was performed following the standard protocol without intravenous contrast. Multiplanar CT image reconstructions of the cervical spine were also generated. RADIATION DOSE REDUCTION: This exam was performed according to the departmental dose-optimization program which includes automated exposure control, adjustment of the mA and/or kV according to patient size and/or use of iterative reconstruction technique. COMPARISON:  CT brain and cervical spine 01/19/2024, MRI 09/01/2023, CT 01/03/2023 FINDINGS: CT HEAD FINDINGS Brain: No acute territorial infarction, hemorrhage or intracranial mass. Atrophy and moderate chronic  small vessel ischemic changes of the white matter. Stable ventricle size. Chronic lacunar infarct at the right caudate. Vascular: No hyperdense vessels.  Carotid vascular calcification Skull: Normal. Negative for fracture or focal lesion. Sinuses/Orbits: No acute finding. Other: None CT CERVICAL SPINE FINDINGS Alignment: Straightening of the cervical spine. Trace anterolisthesis C4 on C5. Facet alignment is within normal limits. Skull base and vertebrae: No acute fracture. No primary bone lesion or focal pathologic process. Soft tissues and spinal canal: No prevertebral fluid or swelling. No visible canal hematoma. Disc levels: Advanced C1-C2 degenerative changes. Interbody devices C5-C6 and C6-C7. Moderate disc space narrowing CT C4 and C4-C5. Multilevel facet degenerative changes. Upper chest: Lung apices are clear Other: None IMPRESSION: 1. No CT evidence for acute intracranial abnormality. Atrophy and chronic small vessel ischemic changes of the  white matter 2. No acute osseous abnormality of the cervical spine. Electronically Signed   By: Luke Bun M.D.   On: 02/28/2024 00:04   CT Cervical Spine Wo Contrast Result Date: 02/28/2024 CLINICAL DATA:  Head and neck trauma EXAM: CT HEAD WITHOUT CONTRAST CT CERVICAL SPINE WITHOUT CONTRAST TECHNIQUE: Multidetector CT imaging of the head and cervical spine was performed following the standard protocol without intravenous contrast. Multiplanar CT image reconstructions of the cervical spine were also generated. RADIATION DOSE REDUCTION: This exam was performed according to the departmental dose-optimization program which includes automated exposure control, adjustment of the mA and/or kV according to patient size and/or use of iterative reconstruction technique. COMPARISON:  CT brain and cervical spine 01/19/2024, MRI 09/01/2023, CT 01/03/2023 FINDINGS: CT HEAD FINDINGS Brain: No acute territorial infarction, hemorrhage or intracranial mass. Atrophy and moderate  chronic small vessel ischemic changes of the white matter. Stable ventricle size. Chronic lacunar infarct at the right caudate. Vascular: No hyperdense vessels.  Carotid vascular calcification Skull: Normal. Negative for fracture or focal lesion. Sinuses/Orbits: No acute finding. Other: None CT CERVICAL SPINE FINDINGS Alignment: Straightening of the cervical spine. Trace anterolisthesis C4 on C5. Facet alignment is within normal limits. Skull base and vertebrae: No acute fracture. No primary bone lesion or focal pathologic process. Soft tissues and spinal canal: No prevertebral fluid or swelling. No visible canal hematoma. Disc levels: Advanced C1-C2 degenerative changes. Interbody devices C5-C6 and C6-C7. Moderate disc space narrowing CT C4 and C4-C5. Multilevel facet degenerative changes. Upper chest: Lung apices are clear Other: None IMPRESSION: 1. No CT evidence for acute intracranial abnormality. Atrophy and chronic small vessel ischemic changes of the white matter 2. No acute osseous abnormality of the cervical spine. Electronically Signed   By: Luke Bun M.D.   On: 02/28/2024 00:04   DG Chest Port 1 View Result Date: 02/27/2024 CLINICAL DATA:  Chest pain. EXAM: PORTABLE CHEST 1 VIEW COMPARISON:  Radiograph 02/06/2024 FINDINGS: The cardiomediastinal contours are stable. Left chest wall loop recorder. The lungs are clear. Pulmonary vasculature is normal. No consolidation, pleural effusion, or pneumothorax. Reverse right shoulder arthroplasty. No acute osseous abnormalities are seen. IMPRESSION: No acute chest findings. Electronically Signed   By: Andrea Gasman M.D.   On: 02/27/2024 23:55        Scheduled Meds:  apixaban   2.5 mg Oral BID   Chlorhexidine  Gluconate Cloth  6 each Topical Daily   feeding supplement  237 mL Oral BID BM   levothyroxine   25 mcg Oral Q0600   melatonin  6 mg Oral QHS   metoprolol  succinate  75 mg Oral Daily   pantoprazole   40 mg Oral Daily   rosuvastatin   20 mg  Oral Daily   sodium chloride  flush  3 mL Intravenous Q12H   Continuous Infusions:  sodium chloride        LOS: 1 day    Time spent: 45 minutes spent on chart review, discussion with nursing staff, consultants, updating family and interview/physical exam; more than 50% of that time was spent in counseling and/or coordination of care.    Harlene RAYMOND Bowl, DO [facility] Available via Epic secure chat 7am-7pm After these hours, please refer to coverage provider listed on [email or web address] 02/29/2024, 12:54 PM

## 2024-02-29 NOTE — Progress Notes (Signed)
 Remote Loop Recorder Transmission

## 2024-02-29 NOTE — Progress Notes (Signed)
 Physical Therapy Treatment Patient Details Name: [name] MRN: [id number] DOB: [birth year] Today's Date: 02/29/2024   History of Present Illness [name] is an 89 y.o. female sent to [facility] from SNF with hyponatremia. S/P ORIF for  right distal femur supracondylar fracture on 9/9. PMHx: anxiety, OA, colon polyps, CAD, dilated cardiomyopathy, chronic systolic CHF, GERD, hiatal hernia, HLD, HTN, hypothyroidism, MGUS, osteoporosis, peripheral neuropathy, restless leg syndrome, nonhemorrhagic CVA, TIAs, detrusor instability, and vertebral fracture.    PT Comments   The patient is eager to mobilize. Patient reports that therapy assisted in [facility] using a sliding boar.Attempted to stand at RW x 3, unable to  stand erect with No WB on the RLE, Right knee lacks knee flex to About 50*, increasing difficulty with  standing. Patient currently is TDWB on the right leg. Patient will benefit from continued inpatient follow up therapy, <3 hours/day    If plan is discharge home, recommend the following: Two people to help with walking and/or transfers;Two people to help with bathing/dressing/bathroom;Assistance with cooking/housework;Assist for transportation;Help with stairs or ramp for entrance   Can travel by private vehicle     No  Equipment Recommendations  None recommended by PT    Recommendations for Other Services       Precautions / Restrictions       Mobility  Bed Mobility   Bed Mobility: Supine to Sit, Sit to Supine Rolling: Min assist   Supine to sit: Mod assist, HOB elevated, Used rails Sit to supine: Mod assist   General bed mobility comments: patient able  to move both legs over bed edge and move trunk to sitting upright.   tends to lean posterior with cues to sit forward    Transfers   Equipment used: Rolling Kimmet (2 wheels) Transfers: Sit to/from Stand Sit to Stand: Max assist, +2 physical assistance, +2 safety/equipment, From elevated surface           General  transfer comment: attempted to stand x 3, Right knee does not flex > 50*, attempted to rise from  bed  at RW, use of bed pad to boost. patient unable  tpo power up and clea buttocks, patient tendening to bear weight on RLE    Ambulation/Gait                   Stairs             Wheelchair Mobility     Tilt Bed    Modified Rankin (Stroke Patients Only)       Balance Overall balance assessment: Needs assistance   Sitting balance-Leahy Scale: Poor     Standing balance support: Bilateral upper extremity supported, During functional activity, Reliant on assistive device for balance Standing balance-Leahy Scale: Zero                              Communication Communication Factors Affecting Communication: Hearing impaired  Cognition Arousal: Alert Behavior During Therapy: WFL for tasks assessed/performed   PT - Cognitive impairments: Difficult to assess, No family/caregiver present to determine baseline                       PT - Cognition Comments: difficult to get clear picture of what therapy was working on in rehab, cues to stay on task Following commands: Intact      Cueing Cueing Techniques: Verbal cues  Exercises General Exercises - Lower Extremity  Ankle Circles/Pumps: AROM, Both, 10 reps Short Arc Quad: AROM, AAROM, Strengthening, Right Heel Slides: AAROM, Right, 10 reps Hip ABduction/ADduction: AROM, Right, 10 reps Straight Leg Raises: AAROM, Right, 10 reps    General Comments        Pertinent Vitals/Pain Pain Assessment Faces Pain Scale: Hurts even more Pain Location: right knee Pain Descriptors / Indicators: Discomfort, Aching, Grimacing Pain Intervention(s): Monitored during session, Limited activity within patient's tolerance, Repositioned    Home Living                          Prior Function            PT Goals (current goals can now be found in the care plan section) Progress towards PT goals:  Progressing toward goals    Frequency    Min 2X/week      PT Plan      Co-evaluation              AM-PAC PT 6 Clicks Mobility   Outcome Measure  Help needed turning from your back to your side while in a flat bed without using bedrails?: A Little Help needed moving from lying on your back to sitting on the side of a flat bed without using bedrails?: A Little Help needed moving to and from a bed to a chair (including a wheelchair)?: A Lot Help needed standing up from a chair using your arms (e.g., wheelchair or bedside chair)?: Total Help needed to walk in hospital room?: Total Help needed climbing 3-5 steps with a railing? : Total 6 Click Score: 1    End of Session Equipment Utilized During Treatment: Gait belt Activity Tolerance: Patient tolerated treatment well;Patient limited by fatigue Patient left: in bed;with call bell/phone within reach;with bed alarm set Nurse Communication: Mobility status;Need for lift equipment PT Visit Diagnosis: Pain;Difficulty in walking, not elsewhere classified (R26.2);Other abnormalities of gait and mobility (R26.89);Unsteadiness on feet (R26.81) Pain - Right/Left: Right Pain - part of body: Leg     Time: 1420-1457 PT Time Calculation (min) (ACUTE ONLY): 37 min  Charges:    $Therapeutic Exercise: 8-22 mins $Therapeutic Activity: 8-22 mins PT General Charges $$ ACUTE PT VISIT: 1 Visit                     Darice Potters PT Acute Rehabilitation Services Office [phone]    Potters Darice Norris 02/29/2024, 3:38 PM

## 2024-02-29 NOTE — Plan of Care (Signed)
  Problem: Education: Goal: Knowledge of General Education information will improve Description: Including pain rating scale, medication(s)/side effects and non-pharmacologic comfort measures Outcome: Progressing   Problem: Health Behavior/Discharge Planning: Goal: Ability to manage health-related needs will improve Outcome: Progressing   Problem: Pain Managment: Goal: General experience of comfort will improve and/or be controlled Outcome: Progressing   Problem: Clinical Measurements: Goal: Ability to maintain clinical measurements within normal limits will improve Outcome: Not Progressing

## 2024-02-29 NOTE — Plan of Care (Signed)
  Problem: Education: Goal: Knowledge of General Education information will improve Description: Including pain rating scale, medication(s)/side effects and non-pharmacologic comfort measures Outcome: Progressing   Problem: Health Behavior/Discharge Planning: Goal: Ability to manage health-related needs will improve Outcome: Progressing   Problem: Nutrition: Goal: Adequate nutrition will be maintained Outcome: Progressing   Problem: Pain Managment: Goal: General experience of comfort will improve and/or be controlled Outcome: Progressing

## 2024-03-01 DIAGNOSIS — E871 Hypo-osmolality and hyponatremia: Secondary | ICD-10-CM | POA: Diagnosis not present

## 2024-03-01 LAB — BASIC METABOLIC PANEL WITH GFR
Anion gap: 9 (ref 5–15)
Anion gap: 8 (ref 5–15)
Anion gap: 8 (ref 5–15)
BUN: 14 mg/dL (ref 8–23)
BUN: 11 mg/dL (ref 8–23)
BUN: 18 mg/dL (ref 8–23)
CO2: 22 mmol/L (ref 22–32)
CO2: 23 mmol/L (ref 22–32)
CO2: 23 mmol/L (ref 22–32)
Calcium: 8.7 mg/dL — ABNORMAL LOW (ref 8.9–10.3)
Calcium: 8.9 mg/dL (ref 8.9–10.3)
Calcium: 9 mg/dL (ref 8.9–10.3)
Chloride: 96 mmol/L — ABNORMAL LOW (ref 98–111)
Chloride: 98 mmol/L (ref 98–111)
Chloride: 97 mmol/L — ABNORMAL LOW (ref 98–111)
Creatinine, Ser: 0.38 mg/dL — ABNORMAL LOW (ref 0.44–1.00)
Creatinine, Ser: 0.43 mg/dL — ABNORMAL LOW (ref 0.44–1.00)
Creatinine, Ser: 0.51 mg/dL (ref 0.44–1.00)
GFR, Estimated: >60 mL/min (ref >60)
GFR, Estimated: >60 mL/min (ref >60)
GFR, Estimated: >60 mL/min (ref >60)
Glucose, Bld: 110 mg/dL — ABNORMAL HIGH (ref 70–99)
Glucose, Bld: 105 mg/dL — ABNORMAL HIGH (ref 70–99)
Glucose, Bld: 125 mg/dL — ABNORMAL HIGH (ref 70–99)
Potassium: 4 mmol/L (ref 3.5–5.1)
Potassium: 4.1 mmol/L (ref 3.5–5.1)
Potassium: 4.4 mmol/L (ref 3.5–5.1)
Sodium: 126 mmol/L — ABNORMAL LOW (ref 135–145)
Sodium: 129 mmol/L — ABNORMAL LOW (ref 135–145)
Sodium: 129 mmol/L — ABNORMAL LOW (ref 135–145)

## 2024-03-01 LAB — KAPPA/LAMBDA LIGHT CHAINS
Kappa free light chain: 19.8 mg/L — ABNORMAL HIGH (ref 3.3–19.4)
Kappa, lambda light chain ratio: 1.21 (ref 0.26–1.65)
Lambda free light chains: 16.3 mg/L (ref 5.7–26.3)

## 2024-03-01 MED ORDER — SODIUM CHLORIDE 0.9 % IV SOLN: INTRAVENOUS | Status: AC

## 2024-03-01 MED ORDER — LOSARTAN POTASSIUM 50 MG PO TABS
50.0000 mg | ORAL_TABLET | Freq: Every day | ORAL | Status: DC
  Administered 2024-03-01 – 2024-03-02 (×2): 50 mg via ORAL
  Filled 2024-03-01 (×2): qty 1

## 2024-03-01 NOTE — NC FL2 (Signed)
 [place]  MEDICAID FL2 LEVEL OF CARE FORM     IDENTIFICATION  Patient Name: [name] Birthdate: February 26, 1934 Sex: female Admission Date (Current Location): 02/27/2024  [facility] and [place] Number:  Producer, television/film/video and Address:  [facility],  [address], [place] [address]      Provider Number: [id number]  Attending Physician Name and Address:  Juvenal Harlene PENNER, DO  Relative Name and Phone Number:  Zahava, Quant (Daughter)  [phone]    Current Level of Care: Hospital Recommended Level of Care: Skilled Nursing Facility Prior Approval Number:    Date Approved/Denied:   PASRR Number: [id number]  Discharge Plan: SNF    Current Diagnoses: Patient Active Problem List   Diagnosis Date Noted   Acute metabolic encephalopathy 02/28/2024   Closed fracture of right distal femur (HCC) 02/06/2024   Macrocytic anemia 02/06/2024   Hyponatremia 02/06/2024   Gastroesophageal reflux disease 02/06/2024   Heart failure with mildly reduced ejection fraction (HFmrEF) (HCC) 01/18/2023   Frequent falls 01/18/2023   Dilated cardiomyopathy (HCC) 10/04/2022   Nonrheumatic aortic valve insufficiency 09/05/2022   Frequent headaches 09/05/2022   PVC's (premature ventricular contractions) 07/06/2021   Pleural effusion, left 07/06/2021   History of CVA (cerebrovascular accident)    Leg weakness 04/19/2021   Hypokalemia 04/19/2021   Confusion 05/20/2019   Post concussive syndrome 05/20/2019   Insomnia 04/10/2019   Peripheral neuropathy 12/27/2017   MGUS (monoclonal gammopathy of unknown significance) 12/27/2017   Lumbar stenosis with neurogenic claudication 12/29/2015   CAD (coronary artery disease) 03/07/2014   Hyperlipidemia 03/07/2014   Essential hypertension 03/07/2014   Fracture of vertebra    Osteoporosis    DI (detrusor instability)    Menopausal symptoms    Anxiety disorder 07/03/2009    Orientation RESPIRATION BLADDER Height & Weight     Self, Time,  Situation, Place  Normal Continent Weight: 54 kg Height:  5' (152.4 cm)  BEHAVIORAL SYMPTOMS/MOOD NEUROLOGICAL BOWEL NUTRITION STATUS      Continent Diet (Regular)  AMBULATORY STATUS COMMUNICATION OF NEEDS Skin   Extensive Assist Verbally Surgical wounds (Rt leg)                       Personal Care Assistance Level of Assistance  Bathing, Feeding, Dressing Bathing Assistance: Maximum assistance Feeding assistance: Limited assistance Dressing Assistance: Limited assistance     Functional Limitations Info  Sight, Hearing, Speech Sight Info: Adequate Hearing Info: Impaired Speech Info: Adequate    SPECIAL CARE FACTORS FREQUENCY  PT (By licensed PT), OT (By licensed OT)     PT Frequency: 5x/week OT Frequency: 5x/week            Contractures Contractures Info: Not present    Additional Factors Info  Code Status Code Status Info: FULL Allergies Info: No Known Allergies           Current Medications (03/01/2024):  This is the current hospital active medication list Current Facility-Administered Medications  Medication Dose Route Frequency Provider Last Rate Last Admin   acetaminophen  (TYLENOL ) tablet 1,000 mg  1,000 mg Oral Q6H PRN Keturah Carrier, MD   1,000 mg at 03/01/24 0529   albuterol (PROVENTIL) (2.5 MG/3ML) 0.083% nebulizer solution 2.5 mg  2.5 mg Nebulization Q4H PRN Segars, Jonathan, MD       ALPRAZolam  (XANAX ) tablet 0.25 mg  0.25 mg Oral TID PRN Segars, Jonathan, MD   0.25 mg at 03/01/24 0809   apixaban  (ELIQUIS ) tablet 2.5 mg  2.5  mg Oral BID Segars, Jonathan, MD   2.5 mg at 03/01/24 0809   Chlorhexidine  Gluconate Cloth 2 % PADS 6 each  6 each Topical Daily Vann, Jessica U, DO   6 each at 02/29/24 2133   feeding supplement (ENSURE PLUS HIGH PROTEIN) liquid 237 mL  237 mL Oral BID BM Vann, Jessica U, DO   237 mL at 03/01/24 1129   hydrALAZINE  (APRESOLINE ) injection 5 mg  5 mg Intravenous Q6H PRN Chavez, Abigail, NP   5 mg at 03/01/24 0423    levothyroxine  (SYNTHROID ) tablet 25 mcg  25 mcg Oral Q0600 Segars, Jonathan, MD   25 mcg at 03/01/24 0529   losartan  (COZAAR ) tablet 50 mg  50 mg Oral Daily Vann, Jessica U, DO   50 mg at 03/01/24 1129   melatonin tablet 6 mg  6 mg Oral QHS Segars, Jonathan, MD   6 mg at 02/29/24 2053   metoprolol  succinate (TOPROL -XL) 24 hr tablet 75 mg  75 mg Oral Daily Segars, Jonathan, MD   75 mg at 03/01/24 0809   ondansetron  (ZOFRAN ) injection 4 mg  4 mg Intravenous Q6H PRN Keturah Carrier, MD       Oral care mouth rinse  15 mL Mouth Rinse PRN Vann, Jessica U, DO       pantoprazole  (PROTONIX ) EC tablet 40 mg  40 mg Oral Daily Segars, Carrier, MD   40 mg at 03/01/24 0809   polyethylene glycol (MIRALAX  / GLYCOLAX ) packet 17 g  17 g Oral Daily PRN Segars, Jonathan, MD       rosuvastatin  (CRESTOR ) tablet 20 mg  20 mg Oral Daily Segars, Jonathan, MD   20 mg at 03/01/24 0809   senna-docusate (Senokot-S) tablet 2 tablet  2 tablet Oral QHS PRN Segars, Jonathan, MD   2 tablet at 02/29/24 2053   sodium chloride  flush (NS) 0.9 % injection 3 mL  3 mL Intravenous Q12H Segars, Jonathan, MD   3 mL at 03/01/24 0810     Discharge Medications: Please see discharge summary for a list of discharge medications.  Relevant Imaging Results:  Relevant Lab Results:   Additional Information SSN: [id number]  Jon ONEIDA Anon, RN

## 2024-03-01 NOTE — Plan of Care (Signed)

## 2024-03-01 NOTE — TOC Initial Note (Signed)
 Transition of Care [facility]) - Initial/Assessment Note    Patient Details  Name: [name] MRN: [id number] Date of Birth: 06-02-1933  Transition of Care [facility]) CM/SW Contact:    Jon ONEIDA Anon, RN Phone Number: 03/01/2024, 11:32 AM  Clinical Narrative:                 Pt admitted from [facility] SNF for STR. Pt to return once medically stable to DC. Pt and pt daughter agreeable to DC plan. Kristin, Admissions Coordinator at [facility] confirms pt is resident for [facility] and can return tomorrow after 5pm. Pt will need transportation set up via [facility]. ICM continuing to follow.     Expected Discharge Plan: Skilled Nursing Facility Barriers to Discharge: Continued Medical Work up   Patient Goals and CMS Choice Patient states their goals for this hospitalization and ongoing recovery are:: To return to [facility] for STR CMS Medicare.gov Compare Post Acute Care list provided to:: Patient Choice offered to / list presented to : Patient [place] ownership interest in [facility].provided to:: Patient    Expected Discharge Plan and Services In-house Referral: NA Discharge Planning Services: CM Consult Post Acute Care Choice: Skilled Nursing Facility Living arrangements for the past 2 months: Skilled Nursing Facility, Single Family Home                 DME Arranged: N/A DME Agency: NA       HH Arranged: NA HH Agency: NA        Prior Living Arrangements/Services Living arrangements for the past 2 months: Skilled Holiday representative, Single Family Home Lives with:: Facility Resident Patient language and need for interpreter reviewed:: Yes Do you feel safe going back to the place where you live?: Yes      Need for Family Participation in Patient Care: Yes (Comment) Care giver support system in place?: Yes (comment) Current home services: Other (comment) (N/A) Criminal Activity/Legal Involvement Pertinent to Current Situation/Hospitalization: No - Comment as  needed  Activities of Daily Living   ADL Screening (condition at time of admission) Independently performs ADLs?: No Does the patient have a NEW difficulty with bathing/dressing/toileting/self-feeding that is expected to last >3 days?: Yes (Initiates electronic notice to provider for possible OT consult) Does the patient have a NEW difficulty with getting in/out of bed, walking, or climbing stairs that is expected to last >3 days?: Yes (Initiates electronic notice to provider for possible PT consult) Does the patient have a NEW difficulty with communication that is expected to last >3 days?: No Is the patient deaf or have difficulty hearing?: Yes Does the patient have difficulty seeing, even when wearing glasses/contacts?: No Does the patient have difficulty concentrating, remembering, or making decisions?: No  Permission Sought/Granted Permission sought to share information with : Family Supports, Oceanographer granted to share information with : Yes, Verbal Permission Granted  Share Information with NAME: Jazzmine, Kleiman (Daughter)  [phone]  Permission granted to share info w AGENCY: [facility]        Emotional Assessment Appearance:: Appears stated age Attitude/Demeanor/Rapport: Engaged Affect (typically observed): Appropriate Orientation: : Oriented to Self, Oriented to Place, Oriented to  Time, Oriented to Situation Alcohol  / Substance Use: Not Applicable Psych Involvement: No (comment)  Admission diagnosis:  Hyponatremia [E87.1] Patient Active Problem List   Diagnosis Date Noted   Acute metabolic encephalopathy 02/28/2024   Closed fracture of right distal femur (HCC) 02/06/2024   Macrocytic anemia 02/06/2024   Hyponatremia 02/06/2024   Gastroesophageal  reflux disease 02/06/2024   Heart failure with mildly reduced ejection fraction (HFmrEF) (HCC) 01/18/2023   Frequent falls 01/18/2023   Dilated cardiomyopathy (HCC) 10/04/2022    Nonrheumatic aortic valve insufficiency 09/05/2022   Frequent headaches 09/05/2022   PVC's (premature ventricular contractions) 07/06/2021   Pleural effusion, left 07/06/2021   History of CVA (cerebrovascular accident)    Leg weakness 04/19/2021   Hypokalemia 04/19/2021   Confusion 05/20/2019   Post concussive syndrome 05/20/2019   Insomnia 04/10/2019   Peripheral neuropathy 12/27/2017   MGUS (monoclonal gammopathy of unknown significance) 12/27/2017   Lumbar stenosis with neurogenic claudication 12/29/2015   CAD (coronary artery disease) 03/07/2014   Hyperlipidemia 03/07/2014   Essential hypertension 03/07/2014   Fracture of vertebra    Osteoporosis    DI (detrusor instability)    Menopausal symptoms    Anxiety disorder 07/03/2009   PCP:  Shepard Ade, MD Pharmacy:   [id number] [place], [place] - [address] [place] [address] [place] [address] [place] [address] Phone: [phone] Fax: [phone]  [facility] - [place], [place] - [address] [place] [address] Phone: [phone] Fax: [phone]  [id number] - [place], [place] - [address] [facility] [address] [place] [address] Phone: [phone] Fax: [phone]     Social Drivers of Health (SDOH) Social History: SDOH Screenings   Food Insecurity: No Food Insecurity (02/28/2024)  Housing: Low Risk  (02/28/2024)  Transportation Needs: No Transportation Needs (02/28/2024)  Utilities: Not At Risk (02/28/2024)  Depression (PHQ2-9): Low Risk  (12/21/2023)  Social Connections: Moderately Integrated (02/28/2024)  Tobacco Use: Low Risk  (02/27/2024)   SDOH Interventions:     Readmission Risk Interventions    03/01/2024   11:28 AM  Readmission Risk Prevention Plan  Transportation Screening Complete  PCP or Specialist Appt within 5-7 Days Complete  Home Care Screening Complete   Medication Review (RN CM) Complete

## 2024-03-01 NOTE — Progress Notes (Signed)
 PROGRESS NOTE    [name]  FMW:[id number] DOB: May 16, 1934 DOA: 02/27/2024 PCP: Shepard Ade, MD    Brief Narrative:  89 y.o. female with hx recent periprosthetic distal R femur fracture and underwent ORIF on 9/9, and discharged to SNF on 9/16; additional hx including CAD, HFimpEF, cryptogenic / embolic CVA, with loop recorder recently detected paroxysmal Atrial fibrillation, HTN, HLD, hypothyroidism, MGUS, colonic polyps, RLS, osteoporosis / fragility fractures, mood d/o, frequent falls, who was brought in from SNF due to finding of hyponatremia.  Sodium is slowly improving and plan is to return to SNF open in the a.m.   Assessment and Plan: Severe hyponatremia, likely subacute onset,  symptomatic with mild metabolic encephalopathy - improving  Consistent with hypovolemia with low solute  Recent decreased food intake, but has been pushing water . 1 day of confusion, suspect symptomatic with hypoNa. More chronic headaches, doubt related. Na 116 -> 120 (after NS 500 cc); at around 4 hr mark. sOsm pending, uOsm 145 (after IVF), and uNa < 30. TSH wnl. Overall labs and response to IVF consistent with hypovolemia with low solute.  -- Sodium continues to improve so will check twice a day from now -- Allow for diet, ok to add salt for now, but restrict water   -Have instructed nursing to finish current bag of IV fluids and then stop  Recent periprosthetic distal R femur fracture s/p ORIF on 9/9 -- Continue Eliquis  2.5 mg BID for DVT ppx  -- Continue PT / OT while inpatient.  -- Discussed with PA Keith-he has ordered imaging and will follow-up in the office in 1 week to remove the sutures   Recently detected paroxysmal Afib on loop recorder  Hx of cryptogenic / embolic appearing strokes  -- Currently on Anticoagulation with Apixaban  2.5 mg BID (therapeutic dose with her age / wt for Afib)  -- May continue on [facility] per above, although caution considering her hx of recurrent falls  -- Stop  aspirin  since to continue on DOAC -- Continue home statin   -- Continue home Metoprolol  for Afib/ HF  -- Can establish with Afib clinic OP    Chronic medical problems:  CAD: On DOAC, statin.  HFimpEF: Without acute exacerbation. OK to add salt for now; hypovolemic / low solute. Once correcting, can restrict again. Not on diuretics OP. Continue home Metoprolol .  Resume losartan   Hypothyroidism: Continue home levothyroxine ; TSH wnl  MGUS: Follows with heme/onc OP -appears Dr. Timmy has ordered a 24-hour urine for UPEP/light chains colonic polyps: Has aged out of colonoscopies; last in '22 without polyps  RLS: Noted  Osteoporosis/ fragility fractures:  On high dose Vit D; continue, F/u OP  Frequent falls: PT/OT per above. Fall precautions / delirium precautions  Mood d/o: Continue home escitalopram , Xanax  prn    Continue to await TOC for return to skilled nursing facility  DVT prophylaxis: apixaban  (ELIQUIS ) tablet 2.5 mg Start: 02/28/24 1000 apixaban  (ELIQUIS ) tablet 2.5 mg    Code Status: Full Code Family Communication: None at bedside  Disposition Plan:  Level of care: Stepdown Status is: Inpatient     Consultants:  Orthopedics (phone)   Subjective: Agreeable to return to skilled nursing facility, says family is pending for bed currently Objective: Vitals:   03/01/24 0600 03/01/24 0700 03/01/24 0800 03/01/24 0809  BP: (!) 156/51 (!) 149/82 (!) 169/81 (!) 169/81  Pulse: 85 91 88 83  Resp: 16 18 19    Temp:   97.8 F (36.6 C)   TempSrc:  Oral   SpO2: 98% 97% 99%   Weight:      Height:        Intake/Output Summary (Last 24 hours) at 03/01/2024 1044 Last data filed at 03/01/2024 0630 Gross per 24 hour  Intake 2306.35 ml  Output 1450 ml  Net 856.35 ml   Filed Weights   02/27/24 2223  Weight: 54 kg    Examination:   General: Appearance:    Well developed, well nourished female in no acute distress     Lungs:     Clear to auscultation bilaterally,  respirations unlabored  Heart:    Normal heart rate.     MS:   All extremities are intact.    Neurologic:   Awake, alert, cannot ramble at times       Data Reviewed: I have personally reviewed following labs and imaging studies  CBC: Recent Labs  Lab 02/27/24 2240 02/29/24 0327  WBC 8.0 6.5  NEUTROABS 5.8  --   HGB 11.5* 9.0*  HCT 34.1* 27.3*  MCV 97.4 102.2*  PLT 320 251   Basic Metabolic Panel: Recent Labs  Lab 02/29/24 0327 02/29/24 0537 02/29/24 1156 02/29/24 1541 02/29/24 1951 02/29/24 2325 03/01/24 0332  NA 122*   < > 124* 125* 127* 126* 129*  K 4.7   < > 4.2 4.6 4.3 4.0 4.1  CL 91*   < > 92* 93* 95* 96* 98  CO2 22   < > 23 23 21* 22 23  GLUCOSE 104*   < > 139* 114* 136* 110* 105*  BUN 14   < > 15 16 15 14 11   CREATININE 0.47   < > 0.50 0.54 0.49 0.38* 0.43*  CALCIUM  8.8*   < > 8.8* 8.8* 8.8* 8.7* 8.9  MG 2.0  --   --   --   --   --   --   PHOS 3.4  --   --   --   --   --   --    < > = values in this interval not displayed.   GFR: Estimated Creatinine Clearance: 34.9 mL/min (A) (by C-G formula based on SCr of 0.43 mg/dL (L)). Liver Function Tests: Recent Labs  Lab 02/27/24 2348  AST 27  ALT 21  ALKPHOS 127*  BILITOT 0.8  PROT 6.0*  ALBUMIN 3.8   No results for input(s): LIPASE, AMYLASE in the last 168 hours. No results for input(s): AMMONIA in the last 168 hours. Coagulation Profile: No results for input(s): INR, PROTIME in the last 168 hours. Cardiac Enzymes: No results for input(s): CKTOTAL, CKMB, CKMBINDEX, TROPONINI in the last 168 hours. BNP (last 3 results) Recent Labs    02/28/24 0258  PROBNP 596.0*   HbA1C: No results for input(s): HGBA1C in the last 72 hours. CBG: No results for input(s): GLUCAP in the last 168 hours. Lipid Profile: No results for input(s): CHOL, HDL, LDLCALC, TRIG, CHOLHDL, LDLDIRECT in the last 72 hours. Thyroid  Function Tests: Recent Labs    02/28/24 0258  TSH 1.930    Anemia Panel: No results for input(s): VITAMINB12, FOLATE, FERRITIN, TIBC, IRON, RETICCTPCT in the last 72 hours. Sepsis Labs: No results for input(s): PROCALCITON, LATICACIDVEN in the last 168 hours.  Recent Results (from the past 240 hours)  MRSA Next Gen by PCR, Nasal     Status: None   Collection Time: 02/28/24  3:55 PM   Specimen: Nasal Mucosa; Nasal Swab  Result Value Ref Range Status   MRSA  by PCR Next Gen NOT DETECTED NOT DETECTED Final    Comment: (NOTE) The GeneXpert MRSA Assay (FDA approved for NASAL specimens only), is one component of a comprehensive MRSA colonization surveillance program. It is not intended to diagnose MRSA infection nor to guide or monitor treatment for MRSA infections. Test performance is not FDA approved in patients less than 19 years old. Performed at [facility], [address] [place], [place] [address]          Radiology Studies: No results found.       Scheduled Meds:  apixaban   2.5 mg Oral BID   Chlorhexidine  Gluconate Cloth  6 each Topical Daily   feeding supplement  237 mL Oral BID BM   levothyroxine   25 mcg Oral Q0600   losartan   50 mg Oral Daily   melatonin  6 mg Oral QHS   metoprolol  succinate  75 mg Oral Daily   pantoprazole   40 mg Oral Daily   rosuvastatin   20 mg Oral Daily   sodium chloride  flush  3 mL Intravenous Q12H   Continuous Infusions:  sodium chloride  125 mL/hr at 03/01/24 0623     LOS: 2 days    Time spent: 45 minutes spent on chart review, discussion with nursing staff, consultants, updating family and interview/physical exam; more than 50% of that time was spent in counseling and/or coordination of care.    Yvonne RAYMOND Bowl, DO [facility] Available via Epic secure chat 7am-7pm After these hours, please refer to coverage provider listed on [email or web address] 03/01/2024, 10:44 AM

## 2024-03-02 DIAGNOSIS — E871 Hypo-osmolality and hyponatremia: Secondary | ICD-10-CM | POA: Diagnosis not present

## 2024-03-02 LAB — BASIC METABOLIC PANEL WITH GFR
Anion gap: 8 (ref 5–15)
Anion gap: 8 (ref 5–15)
BUN: 14 mg/dL (ref 8–23)
BUN: 18 mg/dL (ref 8–23)
CO2: 24 mmol/L (ref 22–32)
CO2: 26 mmol/L (ref 22–32)
Calcium: 9.1 mg/dL (ref 8.9–10.3)
Calcium: 9.4 mg/dL (ref 8.9–10.3)
Chloride: 97 mmol/L — ABNORMAL LOW (ref 98–111)
Chloride: 96 mmol/L — ABNORMAL LOW (ref 98–111)
Creatinine, Ser: 0.48 mg/dL (ref 0.44–1.00)
Creatinine, Ser: 0.52 mg/dL (ref 0.44–1.00)
GFR, Estimated: >60 mL/min (ref >60)
GFR, Estimated: >60 mL/min (ref >60)
Glucose, Bld: 119 mg/dL — ABNORMAL HIGH (ref 70–99)
Glucose, Bld: 97 mg/dL (ref 70–99)
Potassium: 3.8 mmol/L (ref 3.5–5.1)
Potassium: 4.3 mmol/L (ref 3.5–5.1)
Sodium: 129 mmol/L — ABNORMAL LOW (ref 135–145)
Sodium: 130 mmol/L — ABNORMAL LOW (ref 135–145)

## 2024-03-02 MED ORDER — SODIUM CHLORIDE 1 G PO TABS
1.0000 g | ORAL_TABLET | Freq: Once | ORAL | Status: AC
  Administered 2024-03-02: 1 g via ORAL
  Filled 2024-03-02: qty 1

## 2024-03-02 MED ORDER — LOSARTAN POTASSIUM 100 MG PO TABS: 50.0000 mg | ORAL_TABLET | Freq: Every day | ORAL | Status: DC

## 2024-03-02 MED ORDER — ACETAMINOPHEN 325 MG PO TABS
650.0000 mg | ORAL_TABLET | ORAL | Status: DC | PRN
  Administered 2024-03-02 (×2): 650 mg via ORAL
  Filled 2024-03-02 (×2): qty 2

## 2024-03-02 MED ORDER — ACETAMINOPHEN 325 MG PO TABS: 325.0000 mg | ORAL_TABLET | ORAL | Status: AC | PRN

## 2024-03-02 MED ORDER — ALPRAZOLAM 0.5 MG PO TABS: 0.2500 mg | ORAL_TABLET | Freq: Three times a day (TID) | ORAL | 0 refills | Status: AC | PRN

## 2024-03-02 NOTE — TOC Transition Note (Addendum)
 Transition of Care [facility]) - Discharge Note   Patient Details  Name: [name] MRN: [id number] Date of Birth: 1933-12-10  Transition of Care [facility]) CM/SW Contact:  Heather DELENA Saltness, LCSW Phone Number: 03/02/2024, 12:28 PM   Clinical Narrative:    Pt discharging to [facility] in [place] to continue short-term SNF rehab today. Pt accepted to room 30. D/C packet placed in pt's chart at RN station. RN to call report to [phone]. [facility] called at 12:25 PM, transport scheduled for 4:30 PM. Patient and family made aware and in agreement with discharge plan. No further TOC needs at this time.   Final next level of care: Skilled Nursing Facility Barriers to Discharge: Barriers Resolved   Patient Goals and CMS Choice Patient states their goals for this hospitalization and ongoing recovery are:: To return to [facility] for STR CMS Medicare.gov Compare Post Acute Care list provided to:: Patient Choice offered to / list presented to : Patient [place] ownership interest in [facility].provided to:: Patient    Discharge Placement  [facility]              Patient to be transferred to facility by: [facility] Name of family member notified: Patient Patient and family notified of of transfer: 03/02/24  Discharge Plan and Services Additional resources added to the After Visit Summary for  Follow Up In-house Referral: NA Discharge Planning Services: CM Consult Post Acute Care Choice: Skilled Nursing Facility          DME Arranged: N/A DME Agency: NA       HH Arranged: NA HH Agency: NA        Social Drivers of Health (SDOH) Interventions SDOH Screenings   Food Insecurity: No Food Insecurity (02/28/2024)  Housing: Low Risk  (02/28/2024)  Transportation Needs: No Transportation Needs (02/28/2024)  Utilities: Not At Risk (02/28/2024)  Depression (PHQ2-9): Low Risk  (12/21/2023)  Social Connections: Moderately Integrated (02/28/2024)  Tobacco Use: Low Risk   (02/27/2024)     Readmission Risk Interventions    03/01/2024   11:28 AM  Readmission Risk Prevention Plan  Transportation Screening Complete  PCP or Specialist Appt within 5-7 Days Complete  Home Care Screening Complete  Medication Review (RN CM) Complete    Signed: Heather Saltness, MSW, LCSW Clinical Social Worker Inpatient Care Management 03/02/2024 1:11 PM

## 2024-03-02 NOTE — Discharge Summary (Addendum)
 Physician Discharge Summary  [name] FMW:[id number] DOB: 1933/10/02 DOA: 02/27/2024  PCP: Shepard Ade, MD  Admit date: 02/27/2024 Discharge date: 03/02/2024  Admitted From: SNF Discharge disposition: SNF   Recommendations for Outpatient Follow-Up:   BMP weekly Follow-up with orthopedics in 1 week for suture removal in the office, continue prior weightbearing status per PA Francis   Discharge Diagnosis:   Principal Problem:   Hyponatremia Active Problems:   Acute metabolic encephalopathy    Discharge Condition: Improved.  Diet recommendation: Regular.  Wound care: None.  Code status: Full.   History of Present Illness:   [name] is a 89 y.o. female with hx of recent periprosthetic distal R femur fracture and underwent ORIF on 9/9, and discharged to SNF on 9/16; additional hx including CAD, HFimpEF, cryptogenic / embolic CVA, with loop recorder recently detected paroxysmal Atrial fibrillation, HTN, HLD, hypothyroidism, MGUS, colonic polyps, RLS, osteoporosis, vertebral compression fractures, mood d/o, frequent falls, who was brought in from SNF due to finding of hyponatremia. Patient is awake and alert, reports recently feeling lightheaded and like she is going to pass out. Daughter reports that she has been confused especially over the past day, and saying things that dont make sense. Had not c/o Lightheadedness until now that daughter has heard. Has c/o headaches, although these have been ongoing x months and not changed recently. No seizures. Daughter reports that she has poor intake of food (previously on meals on wheels, would eat 1-2 meals per day). Note that patient frequently gets confused when dehydrated so they encourage her to drink lots of water . No recent change in medication other than decrease in Losartan  to 50 mg at night, and recent AC ppx after hip fracture.      Hospital Course by Problem:   Severe hyponatremia, likely subacute onset,   symptomatic with mild metabolic encephalopathy - improving  Consistent with hypovolemia with low solute  Recent decreased food intake, but has been pushing water . 1 day of confusion, suspect symptomatic with hypoNa. More chronic headaches, doubt related. Na 116 -> 120 (after NS 500 cc); at around 4 hr mark. sOsm pending, uOsm 145 (after IVF), and uNa < 30. TSH wnl. Overall labs and response to IVF consistent with hypovolemia with low solute.  -- Sodium continues to improve/stabilize - BMP in 1 week and then follow-up afterwards   Recent periprosthetic distal R femur fracture s/p ORIF on 9/9 -- Continue Eliquis  2.5 mg BID for DVT ppx and see below regarding A-fib -- Continue PT / OT while inpatient.  -- Discussed with PA Keith-he has ordered imaging and will follow-up in the office in 1 week to remove the sutures   Recently detected paroxysmal Afib on loop recorder  Hx of cryptogenic / embolic appearing strokes  -- Currently on Anticoagulation with Apixaban  2.5 mg BID (therapeutic dose with her age / wt for Afib)  -- May continue on [facility] per above, although caution considering her hx of recurrent falls  -- Stop aspirin  since to continue on DOAC -- Continue home statin   -- Continue home Metoprolol  for Afib/ HF  -- Can establish with Afib clinic OP once released from skilled nursing facility   Chronic medical problems:  CAD: On DOAC, statin.  HFimpEF: Without acute exacerbation. OK to add salt for now; hypovolemic / low solute. Once correcting, can restrict again. Not on diuretics OP. Continue home Metoprolol .  Resume losartan    Hypothyroidism: Continue home levothyroxine ; TSH wnl  MGUS:  Follows with heme/onc OP -appears Dr. Timmy has ordered a 24-hour urine for UPEP/light chains-defer results to them colonic polyps: Has aged out of colonoscopies; last in '22 without polyps  RLS: Noted  Osteoporosis/ fragility fractures:  On high dose Vit D; continue, F/u OP  Frequent falls: PT/OT per  above. Fall precautions / delirium precautions  Mood d/o: Xanax  prn , restart Lexapro  with close monitoring of sodium as an outpatient      Medical Consultants:    Orthopedics (phone)  Discharge Exam:   Vitals:   03/02/24 1200 03/02/24 1300  BP: (!) 129/59   Pulse: 93 95  Resp: 17 17  Temp:  97.6 F (36.4 C)  SpO2: 99% 94%   Vitals:   03/02/24 1051 03/02/24 1100 03/02/24 1200 03/02/24 1300  BP: (!) 119/57 (!) 149/66 (!) 129/59   Pulse: (!) 107 (!) 104 93 95  Resp:  19 17 17   Temp:    97.6 F (36.4 C)  TempSrc:    Oral  SpO2:  97% 99% 94%  Weight:      Height:        General exam: Appears calm and comfortable.  Anxious to get back to rehab   The results of significant diagnostics from this hospitalization (including imaging, microbiology, ancillary and laboratory) are listed below for reference.     Procedures and Diagnostic Studies:   DG Knee Right Port Result Date: 02/28/2024 CLINICAL DATA:  Right knee fracture. EXAM: PORTABLE RIGHT KNEE - 1-2 VIEW COMPARISON:  February 06, 2024. FINDINGS: Status post right total knee arthroplasty as well as surgical internal fixation of mildly displaced oblique fracture of distal right femoral shaft just above prosthesis. No significant callus formation is noted. IMPRESSION: Status post surgical internal fixation of distal right femoral fracture as described above. Electronically Signed   By: Lynwood Landy Raddle M.D.   On: 02/28/2024 10:55   CT Angio Chest PE W/Cm &/Or Wo Cm Result Date: 02/28/2024 EXAM: CTA CHEST 02/28/2024 01:22:42 AM TECHNIQUE: CTA of the chest was performed without and with the administration of 75 mL of intravenous contrast (iohexol  (OMNIPAQUE ) 350 MG/ML injection 75 mL IOHEXOL  350 MG/ML SOLN). Multiplanar reformatted images are provided for review. MIP images are provided for review. Automated exposure control, iterative reconstruction, and/or weight based adjustment of the mA/kV was utilized to reduce the radiation  dose to as low as reasonably achievable. COMPARISON: Same-day chest radiograph and CT 01/03/2023. CLINICAL HISTORY: Pulmonary embolism (PE) suspected, high prob. Abnormal sodium levels, some chest heaviness, has improved per pt; 75 ml omni 350. FINDINGS: PULMONARY ARTERIES: Pulmonary arteries are adequately opacified for evaluation. Negative for pulmonary embolism. Main pulmonary artery is normal in caliber. MEDIASTINUM: The heart and pericardium demonstrate no acute abnormality. Coronary artery and aortic atherosclerotic calcifications. Large hiatal hernia. LYMPH NODES: No mediastinal, hilar or axillary lymphadenopathy. LUNGS AND PLEURA: Peribronchovascular mild ground-glass opacities throughout both lungs. Findings favor atypical infection, though edema could appear similarly. No evidence of pleural effusion or pneumothorax. UPPER ABDOMEN: Limited images of the upper abdomen are unremarkable. SOFT TISSUES AND BONES: Right shoulder arthroplasty. Multiple thoracic vertebral body compression fractures status post vertebroplasties. IMPRESSION: 1. No pulmonary embolism. 2. Peribronchovascular mild ground-glass opacities throughout both lungs, favoring atypical infection; pulmonary edema could appear similarly. 3. Large hiatal hernia. Electronically signed by: Norman Gatlin MD 02/28/2024 01:32 AM EDT RP Workstation: [id number]   CT Head Wo Contrast Result Date: 02/28/2024 CLINICAL DATA:  Head and neck trauma EXAM: CT HEAD WITHOUT CONTRAST CT CERVICAL  SPINE WITHOUT CONTRAST TECHNIQUE: Multidetector CT imaging of the head and cervical spine was performed following the standard protocol without intravenous contrast. Multiplanar CT image reconstructions of the cervical spine were also generated. RADIATION DOSE REDUCTION: This exam was performed according to the departmental dose-optimization program which includes automated exposure control, adjustment of the mA and/or kV according to patient size and/or use of  iterative reconstruction technique. COMPARISON:  CT brain and cervical spine 01/19/2024, MRI 09/01/2023, CT 01/03/2023 FINDINGS: CT HEAD FINDINGS Brain: No acute territorial infarction, hemorrhage or intracranial mass. Atrophy and moderate chronic small vessel ischemic changes of the white matter. Stable ventricle size. Chronic lacunar infarct at the right caudate. Vascular: No hyperdense vessels.  Carotid vascular calcification Skull: Normal. Negative for fracture or focal lesion. Sinuses/Orbits: No acute finding. Other: None CT CERVICAL SPINE FINDINGS Alignment: Straightening of the cervical spine. Trace anterolisthesis C4 on C5. Facet alignment is within normal limits. Skull base and vertebrae: No acute fracture. No primary bone lesion or focal pathologic process. Soft tissues and spinal canal: No prevertebral fluid or swelling. No visible canal hematoma. Disc levels: Advanced C1-C2 degenerative changes. Interbody devices C5-C6 and C6-C7. Moderate disc space narrowing CT C4 and C4-C5. Multilevel facet degenerative changes. Upper chest: Lung apices are clear Other: None IMPRESSION: 1. No CT evidence for acute intracranial abnormality. Atrophy and chronic small vessel ischemic changes of the white matter 2. No acute osseous abnormality of the cervical spine. Electronically Signed   By: Luke Bun M.D.   On: 02/28/2024 00:04   CT Cervical Spine Wo Contrast Result Date: 02/28/2024 CLINICAL DATA:  Head and neck trauma EXAM: CT HEAD WITHOUT CONTRAST CT CERVICAL SPINE WITHOUT CONTRAST TECHNIQUE: Multidetector CT imaging of the head and cervical spine was performed following the standard protocol without intravenous contrast. Multiplanar CT image reconstructions of the cervical spine were also generated. RADIATION DOSE REDUCTION: This exam was performed according to the departmental dose-optimization program which includes automated exposure control, adjustment of the mA and/or kV according to patient size and/or  use of iterative reconstruction technique. COMPARISON:  CT brain and cervical spine 01/19/2024, MRI 09/01/2023, CT 01/03/2023 FINDINGS: CT HEAD FINDINGS Brain: No acute territorial infarction, hemorrhage or intracranial mass. Atrophy and moderate chronic small vessel ischemic changes of the white matter. Stable ventricle size. Chronic lacunar infarct at the right caudate. Vascular: No hyperdense vessels.  Carotid vascular calcification Skull: Normal. Negative for fracture or focal lesion. Sinuses/Orbits: No acute finding. Other: None CT CERVICAL SPINE FINDINGS Alignment: Straightening of the cervical spine. Trace anterolisthesis C4 on C5. Facet alignment is within normal limits. Skull base and vertebrae: No acute fracture. No primary bone lesion or focal pathologic process. Soft tissues and spinal canal: No prevertebral fluid or swelling. No visible canal hematoma. Disc levels: Advanced C1-C2 degenerative changes. Interbody devices C5-C6 and C6-C7. Moderate disc space narrowing CT C4 and C4-C5. Multilevel facet degenerative changes. Upper chest: Lung apices are clear Other: None IMPRESSION: 1. No CT evidence for acute intracranial abnormality. Atrophy and chronic small vessel ischemic changes of the white matter 2. No acute osseous abnormality of the cervical spine. Electronically Signed   By: Luke Bun M.D.   On: 02/28/2024 00:04   DG Chest Port 1 View Result Date: 02/27/2024 CLINICAL DATA:  Chest pain. EXAM: PORTABLE CHEST 1 VIEW COMPARISON:  Radiograph 02/06/2024 FINDINGS: The cardiomediastinal contours are stable. Left chest wall loop recorder. The lungs are clear. Pulmonary vasculature is normal. No consolidation, pleural effusion, or pneumothorax. Reverse right shoulder arthroplasty. No  acute osseous abnormalities are seen. IMPRESSION: No acute chest findings. Electronically Signed   By: Andrea Gasman M.D.   On: 02/27/2024 23:55     Labs:   Basic Metabolic Panel: Recent Labs  Lab  02/29/24 0327 02/29/24 0537 02/29/24 2325 03/01/24 0332 03/01/24 1559 03/02/24 0414 03/02/24 1238  NA 122*   < > 126* 129* 129* 129* 130*  K 4.7   < > 4.0 4.1 4.4 3.8 4.3  CL 91*   < > 96* 98 97* 97* 96*  CO2 22   < > 22 23 23 24 26   GLUCOSE 104*   < > 110* 105* 125* 119* 97  BUN 14   < > 14 11 18 14 18   CREATININE 0.47   < > 0.38* 0.43* 0.51 0.48 0.52  CALCIUM  8.8*   < > 8.7* 8.9 9.0 9.1 9.4  MG 2.0  --   --   --   --   --   --   PHOS 3.4  --   --   --   --   --   --    < > = values in this interval not displayed.   GFR Estimated Creatinine Clearance: 34.9 mL/min (by C-G formula based on SCr of 0.52 mg/dL). Liver Function Tests: Recent Labs  Lab 02/27/24 2348  AST 27  ALT 21  ALKPHOS 127*  BILITOT 0.8  PROT 6.0*  ALBUMIN 3.8   No results for input(s): LIPASE, AMYLASE in the last 168 hours. No results for input(s): AMMONIA in the last 168 hours. Coagulation profile No results for input(s): INR, PROTIME in the last 168 hours.  CBC: Recent Labs  Lab 02/27/24 2240 02/29/24 0327  WBC 8.0 6.5  NEUTROABS 5.8  --   HGB 11.5* 9.0*  HCT 34.1* 27.3*  MCV 97.4 102.2*  PLT 320 251   Cardiac Enzymes: No results for input(s): CKTOTAL, CKMB, CKMBINDEX, TROPONINI in the last 168 hours. BNP: Invalid input(s): POCBNP CBG: No results for input(s): GLUCAP in the last 168 hours. D-Dimer No results for input(s): DDIMER in the last 72 hours. Hgb A1c No results for input(s): HGBA1C in the last 72 hours. Lipid Profile No results for input(s): CHOL, HDL, LDLCALC, TRIG, CHOLHDL, LDLDIRECT in the last 72 hours. Thyroid  function studies No results for input(s): TSH, T4TOTAL, T3FREE, THYROIDAB in the last 72 hours.  Invalid input(s): FREET3 Anemia work up No results for input(s): VITAMINB12, FOLATE, FERRITIN, TIBC, IRON, RETICCTPCT in the last 72 hours. Microbiology Recent Results (from the past 240 hours)  MRSA Next  Gen by PCR, Nasal     Status: None   Collection Time: 02/28/24  3:55 PM   Specimen: Nasal Mucosa; Nasal Swab  Result Value Ref Range Status   MRSA by PCR Next Gen NOT DETECTED NOT DETECTED Final    Comment: (NOTE) The GeneXpert MRSA Assay (FDA approved for NASAL specimens only), is one component of a comprehensive MRSA colonization surveillance program. It is not intended to diagnose MRSA infection nor to guide or monitor treatment for MRSA infections. Test performance is not FDA approved in patients less than 38 years old. Performed at [facility], [address] [place], [place] [address]      Discharge Instructions:   Discharge Instructions     Diet general   Complete by: As directed    Discharge wound care:   Complete by: As directed    Will need follow up with ortho in 1 week for suture  removal   Increase activity slowly   Complete by: As directed       Allergies as of 03/02/2024   No Known Allergies      Medication List     PAUSE taking these medications    escitalopram  10 MG tablet Wait to take this until your doctor or other care provider tells you to start again. Commonly known as: LEXAPRO  Take 10 mg by mouth in the morning and at bedtime.   spironolactone  25 MG tablet Wait to take this until your doctor or other care provider tells you to start again. Commonly known as: ALDACTONE  TAKE 1 TABLET (25 MG TOTAL) BY MOUTH DAILY.       STOP taking these medications    aspirin  EC 81 MG tablet   HYDROcodone -acetaminophen  5-325 MG tablet Commonly known as: NORCO/VICODIN       TAKE these medications    acetaminophen  325 MG tablet Commonly known as: TYLENOL  Take 1-2 tablets (325-650 mg total) by mouth every 4 (four) hours as needed for mild pain (pain score 1-3) or fever (or temp > 100.5). What changed: when to take this   Align 4 MG Caps Take 1 capsule by mouth daily.   ALPRAZolam  0.5 MG tablet Commonly known as:  XANAX  Take 0.5 tablets (0.25 mg total) by mouth 3 (three) times daily as needed (restless legs).   apixaban  2.5 MG Tabs tablet Commonly known as: Eliquis  Take 1 tablet (2.5 mg total) by mouth 2 (two) times daily.   CALCIUM -D PO Take 1 tablet by mouth daily.   dexlansoprazole  60 MG capsule Commonly known as: DEXILANT  Take 60 mg by mouth every morning.   levothyroxine  25 MCG tablet Commonly known as: SYNTHROID  Take 25 mcg by mouth daily before breakfast.   losartan  100 MG tablet Commonly known as: COZAAR  Take 0.5 tablets (50 mg total) by mouth at bedtime.   metoprolol  succinate 50 MG 24 hr tablet Commonly known as: TOPROL -XL Take 1.5 tablets (75 mg total) by mouth daily. Take with or immediately following a meal.   multivitamin with minerals Tabs tablet Take 1 tablet by mouth daily.   polyethylene glycol 17 g packet Commonly known as: MIRALAX  / GLYCOLAX  Take 17 g by mouth every other day.   pyridoxine  500 MG tablet Commonly known as: B-6 Take 1 tablet (500 mg total) by mouth daily.   rosuvastatin  20 MG tablet Commonly known as: CRESTOR  Take 1 tablet (20 mg total) by mouth daily.   sennosides-docusate sodium  8.6-50 MG tablet Commonly known as: SENOKOT-S Take 2 tablets by mouth at bedtime.   VITAMIN C PO Take 500 mg by mouth daily.   Vitamin D  (Ergocalciferol ) 1.25 MG (50000 UNIT) Caps capsule Commonly known as: DRISDOL  Take 50,000 Units by mouth every Friday.   VITAMIN E PO Take 400 Units by mouth daily.   zolpidem  12.5 MG CR tablet Commonly known as: AMBIEN  CR Take 12.5 mg by mouth at bedtime.               Discharge Care Instructions  (From admission, onward)           Start     Ordered   03/02/24 0000  Discharge wound care:       Comments: Will need follow up with ortho in 1 week for suture removal   03/02/24 1217            Contact information for after-discharge care     Destination     [facility] .   Service: Skilled Nursing Contact information: [address] [place]  [address] [phone]                      Time coordinating discharge: 45  Signed:  Harlene RAYMOND Bowl DO  [facility] 03/02/2024, 1:19 PM

## 2024-03-02 NOTE — Progress Notes (Signed)
 [facility] contacted and report was given to the nurse on duty today. Awaiting for transport to arrive for discharge.   [name]

## 2024-03-02 NOTE — Plan of Care (Signed)

## 2024-03-04 ENCOUNTER — Ambulatory Visit

## 2024-03-05 ENCOUNTER — Ambulatory Visit: Payer: Self-pay | Admitting: Hematology & Oncology

## 2024-03-05 LAB — UPEP/UIFE/LIGHT CHAINS/TP, 24-HR UR
% BETA, Urine: 23.7 %
ALPHA 1 URINE: 6.1 %
Albumin, U: 45.2 %
Alpha 2, Urine: 10 %
Free Kappa Lt Chains,Ur: 14.73 mg/L (ref 1.17–86.46)
Free Kappa/Lambda Ratio: 3.99 (ref 1.83–14.26)
Free Lambda Lt Chains,Ur: 3.69 mg/L (ref 0.27–15.21)
GAMMA GLOBULIN URINE: 15 %
Total Protein, Urine-Ur/day: 529 mg/(24.h) — ABNORMAL HIGH (ref 30–150)
Total Protein, Urine: 23.1 mg/dL
Total Volume: 2290

## 2024-03-05 LAB — MULTIPLE MYELOMA PANEL, SERUM
Albumin SerPl Elph-Mcnc: 2.9 g/dL (ref 2.9–4.4)
Albumin/Glob SerPl: 1.4 (ref 0.7–1.7)
Alpha 1: 0.3 g/dL (ref 0.0–0.4)
Alpha2 Glob SerPl Elph-Mcnc: 0.6 g/dL (ref 0.4–1.0)
B-Globulin SerPl Elph-Mcnc: 0.8 g/dL (ref 0.7–1.3)
Gamma Glob SerPl Elph-Mcnc: 0.5 g/dL (ref 0.4–1.8)
Globulin, Total: 2.1 g/dL — ABNORMAL LOW (ref 2.2–3.9)
IgA: 273 mg/dL (ref 64–422)
IgG (Immunoglobin G), Serum: 537 mg/dL — ABNORMAL LOW (ref 586–1602)
IgM (Immunoglobulin M), Srm: 44 mg/dL (ref 26–217)
Total Protein ELP: 5 g/dL — ABNORMAL LOW (ref 6.0–8.5)

## 2024-03-06 NOTE — Telephone Encounter (Signed)
-----   Message from Maude JONELLE Crease sent at 03/05/2024  7:54 PM EDT ----- Call - let her know that the urine does NOT show any abnormal protein!!!   This is very good.  Jeralyn ----- Message ----- From: Rebecka, [facility] [place] Sent: 03/05/2024   1:35 PM EDT To: Maude JONELLE Crease, MD

## 2024-03-06 NOTE — Telephone Encounter (Signed)
 As noted below by Dr. Timmy, I called Pam the daughter to inform her that the urine does NOT show any abnormal protein. This is very good! She verbalized understanding.

## 2024-03-06 NOTE — Telephone Encounter (Signed)
 Pt's daughter (PAM) advised of results- ok per DPR.

## 2024-03-07 ENCOUNTER — Ambulatory Visit: Admitting: Hematology & Oncology

## 2024-03-07 ENCOUNTER — Inpatient Hospital Stay

## 2024-03-11 ENCOUNTER — Ambulatory Visit: Payer: Medicare Other

## 2024-03-11 NOTE — Telephone Encounter (Signed)
 Left message for patient to call back

## 2024-03-12 NOTE — Telephone Encounter (Signed)
 Spoke with the patient who states that she is still taking Eliquis  twice daily. She states that she is currently at a rehab facility. She will contact us  when she is out and able to come in for an appointment. She is aware that Dr. Cindie would like for her to remain on Eliquis  due to her AFIB. She will have the staff at her facility call us  if there are any questions or concerns.

## 2024-03-12 NOTE — Telephone Encounter (Signed)
 Pt daughter returning call

## 2024-03-13 ENCOUNTER — Ambulatory Visit: Admitting: Neurology

## 2024-03-22 ENCOUNTER — Ambulatory Visit

## 2024-03-22 DIAGNOSIS — I639 Cerebral infarction, unspecified: Secondary | ICD-10-CM | POA: Diagnosis not present

## 2024-03-23 LAB — CUP PACEART REMOTE DEVICE CHECK
Date Time Interrogation Session: 20251023232018
Implantable Pulse Generator Implant Date: 20230407

## 2024-03-25 ENCOUNTER — Ambulatory Visit: Payer: Self-pay | Admitting: Cardiology

## 2024-03-26 ENCOUNTER — Telehealth: Payer: Self-pay | Admitting: Cardiology

## 2024-03-26 NOTE — Progress Notes (Signed)
 Remote Loop Recorder Transmission

## 2024-03-26 NOTE — Telephone Encounter (Signed)
 Pt returning call. Please advise.   Pt asks you not call her home number. She is in rehab and would like calls on her mobile. [phone]

## 2024-03-26 NOTE — Telephone Encounter (Signed)
 Left message for patient to call back

## 2024-03-26 NOTE — Telephone Encounter (Signed)
 Spoke with the patient who states that she is still in rehab. She is still unable to come in for an appointment. She confirms that she is still taking Eliquis . Nothing further needed at this time.

## 2024-03-26 NOTE — Telephone Encounter (Signed)
 Pt states she is returning a call from a message from [facility]

## 2024-03-27 ENCOUNTER — Ambulatory Visit: Admitting: Neurology

## 2024-03-28 ENCOUNTER — Other Ambulatory Visit: Payer: Self-pay | Admitting: Internal Medicine

## 2024-04-08 ENCOUNTER — Ambulatory Visit

## 2024-04-15 ENCOUNTER — Ambulatory Visit: Payer: Medicare Other

## 2024-04-20 NOTE — Op Note (Signed)
 02/06/2024  1:39 PM  PATIENT:  [name]  89 y.o. female  PRE-OPERATIVE DIAGNOSIS:  RIGHT PERIPROSTHETIC SUPRACONDYLAR FEMUR FRACTURE  POST-OPERATIVE DIAGNOSIS:  RIGHT PERIPROSTHETIC SUPRACONDYLAR FEMUR FRACTURE  PROCEDURE:  OPEN REDUCTION INTERNAL FIXATION OF RIGHT PERIPROSTHETIC FEMUR FRACTURE WITH SMITH NEPHEW PLATE  SURGEON:  Yvonne BRUCH, MD  PHYSICIAN ASSISTANT: Francis Mt, PA-C  ANESTHESIA:   GENERAL  I/O:  No intake/output data recorded.  SPECIMEN:  No Specimen  TOURNIQUET:  NONE  COMPLICATIONS: NONE  DICTATION: Note written in EPIC  DISPOSITION: TO PACU  CONDITION: STABLE  DELAY START OF DVT PROPHYLAXIS BECAUSE OF BLEEDING RISK: NO  BRIEF SUMMARY OF INDICATION FOR PROCEDURE:  [name] is a 89 y.o. who sustained a comminuted supracondylar femur fracture, which was quite distal and near to the femoral implant, in ground level fall. The risks and benefits of surgery were discussed with the patient and family, including the possibility of infection, nerve injury, vessel injury, wound breakdown, arthritis, symptomatic hardware, DVT/ PE, loss of motion, malunion, nonunion, heart attack, stroke, death, implant loosening, and need for further surgery among others.  These risks were acknowledged and consent provided to proceed.   BRIEF SUMMARY OF PROCEDURE:  The patient was taken to the operating room where general anesthesia was induced and after receipt of preoperative antibiotics.  The right lower extremity was prepped and draped in usual sterile fashion.  No tourniquet was used during the procedure.  A radiolucent triangle was placed underneath the femur and towel bumps to restore appropriate alignment and length. C-arm was brought in to confirm the appropriate position of the distal incision.  This was checked on lateral as well.  An incision was then made.  Dissection was carried down to the IT band.  It was split in line with the incision.  The deep  protractor was placed.  Hematoma evacuated. My assistant pulled and maintained traction and dialed in the rotation for alignment.  I then introduced the plate.  I placed a pin distally parallel to the femoral component and joint line of the tibial tray and then checked the position on both AP and lateral views proximally, placing a single screw distally and a drill bit through the most proximal hole.  To create a bridge construct. I then placed multiple locked screws in the articular block, checking their trajectory and alignment with fluoro, followed by additional standard screws proximally. Wounds were irrigated thoroughly and then closed in standard layered fashion using #1 Vicryl for the tensor, 0 Vicryl for the deep subcu, 2-0 Vicryl and 3-0 vertical mattress sutures for the skin.  A sterile gently compressive dressing was then applied with an Ace wrap from foot to thigh as well as a knee immobilizer until the patient wakes up adequately from anesthesia at which time she will be allowed unrestricted range of motion. Francis Mt, PA-C, assisted me throughout as did a PA student and an assistant was necessary to obtain and maintain reduction during provisional and definitive fixation and also assisted with wound closure.   PROGNOSIS:  Because of comorbidities patient is at increased risk for perioperative complications. PT/ OT to assist with touch down weightbearing and unrestricted range of motion of the knee without bracing.  Formal pharmacologic DVT prophylaxis with Lovenox . F/u in 10-14 days for removal of sutures.     [name]. Bruch, M.D.

## 2024-04-22 ENCOUNTER — Ambulatory Visit

## 2024-04-22 DIAGNOSIS — I639 Cerebral infarction, unspecified: Secondary | ICD-10-CM

## 2024-04-23 LAB — CUP PACEART REMOTE DEVICE CHECK
Date Time Interrogation Session: 20251123233829
Implantable Pulse Generator Implant Date: 20230407

## 2024-04-23 NOTE — Progress Notes (Signed)
 Remote Loop Recorder Transmission

## 2024-04-24 ENCOUNTER — Ambulatory Visit: Payer: Self-pay | Admitting: Cardiology

## 2024-05-13 ENCOUNTER — Ambulatory Visit

## 2024-05-20 ENCOUNTER — Ambulatory Visit: Payer: Medicare Other

## 2024-05-23 ENCOUNTER — Ambulatory Visit

## 2024-05-23 DIAGNOSIS — I639 Cerebral infarction, unspecified: Secondary | ICD-10-CM | POA: Diagnosis not present

## 2024-05-24 LAB — CUP PACEART REMOTE DEVICE CHECK
Date Time Interrogation Session: 20251224232211
Implantable Pulse Generator Implant Date: 20230407

## 2024-05-24 NOTE — Progress Notes (Signed)
 Remote Loop Recorder Transmission

## 2024-05-27 ENCOUNTER — Inpatient Hospital Stay: Admitting: Hematology & Oncology

## 2024-05-27 ENCOUNTER — Encounter: Payer: Self-pay | Admitting: Hematology & Oncology

## 2024-05-27 ENCOUNTER — Inpatient Hospital Stay: Attending: Hematology & Oncology

## 2024-05-27 VITALS — BP 161/95 | HR 80 | Temp 98.3°F | Resp 20 | Ht 60.0 in

## 2024-05-27 DIAGNOSIS — E871 Hypo-osmolality and hyponatremia: Secondary | ICD-10-CM | POA: Diagnosis not present

## 2024-05-27 DIAGNOSIS — Z7901 Long term (current) use of anticoagulants: Secondary | ICD-10-CM | POA: Diagnosis not present

## 2024-05-27 DIAGNOSIS — Z79899 Other long term (current) drug therapy: Secondary | ICD-10-CM | POA: Insufficient documentation

## 2024-05-27 DIAGNOSIS — D472 Monoclonal gammopathy: Secondary | ICD-10-CM | POA: Insufficient documentation

## 2024-05-27 DIAGNOSIS — Z79890 Hormone replacement therapy (postmenopausal): Secondary | ICD-10-CM | POA: Insufficient documentation

## 2024-05-27 DIAGNOSIS — M791 Myalgia, unspecified site: Secondary | ICD-10-CM | POA: Insufficient documentation

## 2024-05-27 DIAGNOSIS — R296 Repeated falls: Secondary | ICD-10-CM

## 2024-05-27 DIAGNOSIS — I420 Dilated cardiomyopathy: Secondary | ICD-10-CM

## 2024-05-27 LAB — CMP (CANCER CENTER ONLY)
ALT: 22 U/L (ref 0–44)
AST: 28 U/L (ref 15–41)
Albumin: 4.6 g/dL (ref 3.5–5.0)
Alkaline Phosphatase: 97 U/L (ref 38–126)
Anion gap: 11 (ref 5–15)
BUN: 16 mg/dL (ref 8–23)
CO2: 26 mmol/L (ref 22–32)
Calcium: 10.6 mg/dL — ABNORMAL HIGH (ref 8.9–10.3)
Chloride: 103 mmol/L (ref 98–111)
Creatinine: 0.69 mg/dL (ref 0.44–1.00)
GFR, Estimated: >60 mL/min
Glucose, Bld: 100 mg/dL — ABNORMAL HIGH (ref 70–99)
Potassium: 3.5 mmol/L (ref 3.5–5.1)
Sodium: 140 mmol/L (ref 135–145)
Total Bilirubin: 0.5 mg/dL (ref 0.0–1.2)
Total Protein: 7.5 g/dL (ref 6.5–8.1)

## 2024-05-27 LAB — CBC WITH DIFFERENTIAL (CANCER CENTER ONLY)
Abs Immature Granulocytes: 0.01 K/uL (ref 0.00–0.07)
Basophils Absolute: 0 K/uL (ref 0.0–0.1)
Basophils Relative: 0 %
Eosinophils Absolute: 0 K/uL (ref 0.0–0.5)
Eosinophils Relative: 1 %
HCT: 43.4 % (ref 36.0–46.0)
Hemoglobin: 14.4 g/dL (ref 12.0–15.0)
Immature Granulocytes: 0 %
Lymphocytes Relative: 32 %
Lymphs Abs: 1.5 K/uL (ref 0.7–4.0)
MCH: 32.5 pg (ref 26.0–34.0)
MCHC: 33.2 g/dL (ref 30.0–36.0)
MCV: 98 fL (ref 80.0–100.0)
Monocytes Absolute: 0.6 K/uL (ref 0.1–1.0)
Monocytes Relative: 13 %
Neutro Abs: 2.6 K/uL (ref 1.7–7.7)
Neutrophils Relative %: 54 %
Platelet Count: 281 K/uL (ref 150–400)
RBC: 4.43 MIL/uL (ref 3.87–5.11)
RDW: 12.8 % (ref 11.5–15.5)
WBC Count: 4.8 K/uL (ref 4.0–10.5)
nRBC: 0 % (ref 0.0–0.2)

## 2024-05-27 LAB — VITAMIN B12: Vitamin B-12: 955 pg/mL — ABNORMAL HIGH (ref 180–914)

## 2024-05-27 LAB — LACTATE DEHYDROGENASE: LDH: 216 U/L (ref 105–235)

## 2024-05-27 NOTE — Progress Notes (Signed)
 " Hematology and Oncology Follow Up Visit  MIIA BLANKS [id number] [birth year] 89 y.o. 05/27/2024   Principle Diagnosis:  IgA Kappa MGUS  Current Therapy:   Observation     Interim History:  Ms. Totton is back for follow-up.  9, said that she has been quite busy.  Back in early September, she had hip surgery for fractured right femur that she sustained while falling.  She did well with this.  Dr. Ozell Bruch was a careers adviser.  Then, in late September, she was admitted because of hyponatremia.  It is hard to say what was the cause of the hyponatremia.  She had been at a nursing home.  She was in the hospital for several days.  Her sodium came back up.  She was put on sodium chloride  pills.  She gets around in a wheelchair.  She does walk a little bit but she must have assistance.  She does have some physical therapy at home.  From my point of view, we absolutely have no blood problem.  We are watching her MGUS.  The MGUS clearly is reactionary.  When we last had this checked, there is no monoclonal spike in her blood.  This was back in early October.  Her IgA level was 273 mg/dL.  Her kappa light chain was 2 mg/dL.  I think she is eating okay.  I think she had a decent Thanksgiving and Christmas.  Her son and his wife came in for the visit.  She has had no fever.  She has had no bleeding.  She has had no change in bowel or bladder habits.  She has had no rashes.  Overall, I will say that her performance status is probably ECOG 2-3.   Medications:  Current Outpatient Medications:    acetaminophen  (TYLENOL ) 325 MG tablet, Take 1-2 tablets (325-650 mg total) by mouth every 4 (four) hours as needed for mild pain (pain score 1-3) or fever (or temp > 100.5)., Disp: , Rfl:    ALPRAZolam  (XANAX ) 0.5 MG tablet, Take 0.5 tablets (0.25 mg total) by mouth 3 (three) times daily as needed (restless legs)., Disp: 2 tablet, Rfl: 0   Ascorbic Acid (VITAMIN C PO), Take 500 mg by mouth daily., Disp: ,  Rfl:    Calcium  Carbonate-Vitamin D  (CALCIUM -D PO), Take 1 tablet by mouth daily., Disp: , Rfl:    celecoxib (CELEBREX) 200 MG capsule, Take 200 mg by mouth daily as needed., Disp: , Rfl:    dexlansoprazole (DEXILANT) 60 MG capsule, Take 60 mg by mouth every morning., Disp: , Rfl:    [Paused] escitalopram  (LEXAPRO ) 10 MG tablet, Take 10 mg by mouth in the morning and at bedtime. (Patient taking differently: Take 10 mg by mouth in the morning and at bedtime. 05/27/2024 States never stopped taking.), Disp: , Rfl:    levothyroxine  (SYNTHROID , LEVOTHROID) 25 MCG tablet, Take 25 mcg by mouth daily before breakfast. , Disp: , Rfl:    losartan  (COZAAR ) 50 MG tablet, Take 50 mg by mouth daily., Disp: , Rfl:    metoprolol  succinate (TOPROL -XL) 50 MG 24 hr tablet, Take 1.5 tablets (75 mg total) by mouth daily. Take with or immediately following a meal., Disp: 135 tablet, Rfl: 3   Multiple Vitamin (MULTIVITAMIN WITH MINERALS) TABS tablet, Take 1 tablet by mouth daily., Disp: , Rfl:    Probiotic Product (ALIGN) 4 MG CAPS, Take 1 capsule by mouth daily., Disp: , Rfl:    pyridoxine  (B-6) 500 MG tablet, Take 1 tablet (  500 mg total) by mouth daily., Disp: 30 tablet, Rfl: 5   rosuvastatin  (CRESTOR ) 20 MG tablet, Take 1 tablet (20 mg total) by mouth daily., Disp: 30 tablet, Rfl: 0   sennosides-docusate sodium  (SENOKOT-S) 8.6-50 MG tablet, Take 2 tablets by mouth at bedtime., Disp: , Rfl:    [Paused] spironolactone  (ALDACTONE ) 25 MG tablet, TAKE 1 TABLET (25 MG TOTAL) BY MOUTH DAILY. (Patient taking differently: Take 25 mg by mouth daily. 05/27/2024 Takes 12.5 mg for swelling.), Disp: 90 tablet, Rfl: 0   Vitamin D , Ergocalciferol , (DRISDOL) 50000 UNITS CAPS, Take 50,000 Units by mouth every Friday., Disp: , Rfl:    VITAMIN E PO, Take 400 Units by mouth daily., Disp: , Rfl:    zolpidem  (AMBIEN  CR) 12.5 MG CR tablet, Take 12.5 mg by mouth at bedtime., Disp: , Rfl:    apixaban  (ELIQUIS ) 2.5 MG TABS tablet, Take 1 tablet  (2.5 mg total) by mouth 2 (two) times daily. (Patient not taking: Reported on 05/27/2024), Disp: 60 tablet, Rfl: 0   polyethylene glycol (MIRALAX  / GLYCOLAX ) 17 g packet, Take 17 g by mouth every other day. (Patient not taking: Reported on 05/27/2024), Disp: , Rfl:   Allergies: No Known Allergies  Past Medical History, Surgical history, Social history, and Family History were reviewed and updated.  Review of Systems: Review of Systems  Constitutional: Negative.  Negative for appetite change, fatigue, fever and unexpected weight change.  HENT:  Negative.  Negative for lump/mass, mouth sores, sore throat and trouble swallowing.   Eyes: Negative.   Respiratory: Negative.  Negative for cough, hemoptysis and shortness of breath.   Cardiovascular: Negative.  Negative for leg swelling and palpitations.  Gastrointestinal: Negative.  Negative for abdominal distention, abdominal pain, blood in stool, constipation, diarrhea, nausea and vomiting.  Endocrine: Negative.   Genitourinary: Negative.  Negative for bladder incontinence, dysuria, frequency and hematuria.   Musculoskeletal:  Positive for myalgias. Negative for arthralgias, back pain and gait problem.  Skin: Negative.  Negative for itching and rash.  Neurological:  Positive for headaches. Negative for dizziness, extremity weakness, gait problem, numbness, seizures and speech difficulty.  Hematological: Negative.  Does not bruise/bleed easily.  Psychiatric/Behavioral: Negative.  Negative for depression and sleep disturbance. The patient is not nervous/anxious.     Physical Exam: Temperature is 98.3.  Pulse 80.  Blood pressure 161/95.  Weight is not taken.     Wt Readings from Last 3 Encounters:  02/27/24 119 lb (54 kg)  02/07/24 119 lb 12.8 oz (54.3 kg)  01/19/24 119 lb (54 kg)    Physical Exam Vitals reviewed.  HENT:     Head: Normocephalic and atraumatic.  Eyes:     Pupils: Pupils are equal, round, and reactive to light.   Cardiovascular:     Rate and Rhythm: Normal rate and regular rhythm.     Heart sounds: Normal heart sounds.  Pulmonary:     Effort: Pulmonary effort is normal.     Breath sounds: Normal breath sounds.  Abdominal:     General: Bowel sounds are normal.     Palpations: Abdomen is soft.  Musculoskeletal:        General: No tenderness or deformity. Normal range of motion.     Cervical back: Normal range of motion.  Lymphadenopathy:     Cervical: No cervical adenopathy.  Skin:    General: Skin is warm and dry.     Findings: No erythema or rash.  Neurological:     Mental Status: She is  alert and oriented to person, place, and time.  Psychiatric:        Behavior: Behavior normal.        Thought Content: Thought content normal.        Judgment: Judgment normal.      Lab Results  Component Value Date   WBC 4.8 05/27/2024   HGB 14.4 05/27/2024   HCT 43.4 05/27/2024   MCV 98.0 05/27/2024   PLT 281 05/27/2024     Chemistry      Component Value Date/Time   NA 140 05/27/2024 1138   NA 132 (L) 02/10/2023 1159   K 3.5 05/27/2024 1138   CL 103 05/27/2024 1138   CO2 26 05/27/2024 1138   BUN 16 05/27/2024 1138   BUN 13 02/10/2023 1159   CREATININE 0.69 05/27/2024 1138      Component Value Date/Time   CALCIUM  10.6 (H) 05/27/2024 1138   ALKPHOS 97 05/27/2024 1138   AST 28 05/27/2024 1138   ALT 22 05/27/2024 1138   BILITOT 0.5 05/27/2024 1138      Impression and Plan: Ms. Greeson is a 89 year old white female.  She has an IgA kappa MGUS.  Again, I really do not see this as a problem.  Again I think this is reactionary and also secondary to her advanced age.  I am glad that she got through the hip surgery.  Somehow, have to believe that the hip surgery may have been a contributing factor to her hyponatremia.  Everything seems to have resolved.  Her hemoglobin is fantastic.  Her sodium is back where it needs to be.  Hopefully we can now get her through the Winter.  I just feel  bad that she has had all these issues.  Hopefully, 2026 will be a very quiet year for her.  SABRA Maude JONELLE Timmy, MD 12/29/202512:43 PM "

## 2024-05-27 NOTE — Progress Notes (Signed)
 BP remains elevated, 161/95. Instructed to monitor at home and if if remains over 140/90, notify PCP.  To see Dr. Sammy next week. Also son is going to verify she is taking her 2 BP meds.

## 2024-05-28 LAB — KAPPA/LAMBDA LIGHT CHAINS
Kappa free light chain: 29.9 mg/L — ABNORMAL HIGH (ref 3.3–19.4)
Kappa, lambda light chain ratio: 1.27 (ref 0.26–1.65)
Lambda free light chains: 23.5 mg/L (ref 5.7–26.3)

## 2024-05-28 LAB — IGG, IGA, IGM
IgA: 358 mg/dL (ref 64–422)
IgG (Immunoglobin G), Serum: 795 mg/dL (ref 586–1602)
IgM (Immunoglobulin M), Srm: 90 mg/dL (ref 26–217)

## 2024-05-31 ENCOUNTER — Ambulatory Visit: Payer: Self-pay | Admitting: Hematology & Oncology

## 2024-05-31 LAB — PROTEIN ELECTROPHORESIS, SERUM, WITH REFLEX
A/G Ratio: 1.2 (ref 0.7–1.7)
Albumin ELP: 3.8 g/dL (ref 2.9–4.4)
Alpha-1-Globulin: 0.3 g/dL (ref 0.0–0.4)
Alpha-2-Globulin: 0.8 g/dL (ref 0.4–1.0)
Beta Globulin: 1.1 g/dL (ref 0.7–1.3)
Gamma Globulin: 1 g/dL (ref 0.4–1.8)
Globulin, Total: 3.3 g/dL (ref 2.2–3.9)
M-Spike, %: 0.2 g/dL — ABNORMAL HIGH
SPEP Interpretation: 0
Total Protein ELP: 7.1 g/dL (ref 6.0–8.5)

## 2024-05-31 LAB — IMMUNOFIXATION REFLEX, SERUM
IgA: 397 mg/dL (ref 64–422)
IgG (Immunoglobin G), Serum: 880 mg/dL (ref 586–1602)
IgM (Immunoglobulin M), Srm: 99 mg/dL (ref 26–217)

## 2024-06-01 ENCOUNTER — Ambulatory Visit: Payer: Self-pay | Admitting: Cardiology

## 2024-06-17 ENCOUNTER — Ambulatory Visit

## 2024-06-23 ENCOUNTER — Ambulatory Visit

## 2024-06-23 DIAGNOSIS — I639 Cerebral infarction, unspecified: Secondary | ICD-10-CM | POA: Diagnosis not present

## 2024-06-24 ENCOUNTER — Ambulatory Visit: Payer: Medicare Other

## 2024-06-24 LAB — CUP PACEART REMOTE DEVICE CHECK
Date Time Interrogation Session: 20260124233321
Implantable Pulse Generator Implant Date: 20230407

## 2024-06-25 ENCOUNTER — Ambulatory Visit: Payer: Self-pay | Admitting: Cardiology

## 2024-06-27 NOTE — Progress Notes (Signed)
 Remote Loop Recorder Transmission

## 2024-06-30 ENCOUNTER — Other Ambulatory Visit: Payer: Self-pay

## 2024-06-30 ENCOUNTER — Emergency Department (HOSPITAL_BASED_OUTPATIENT_CLINIC_OR_DEPARTMENT_OTHER)

## 2024-06-30 ENCOUNTER — Emergency Department (HOSPITAL_BASED_OUTPATIENT_CLINIC_OR_DEPARTMENT_OTHER)
Admission: EM | Admit: 2024-06-30 | Discharge: 2024-06-30 | Disposition: A | Discharge status: Home / Self Care | Attending: Emergency Medicine | Admitting: Emergency Medicine

## 2024-06-30 DIAGNOSIS — S0003XA Contusion of scalp, initial encounter: Secondary | ICD-10-CM

## 2024-06-30 DIAGNOSIS — I509 Heart failure, unspecified: Secondary | ICD-10-CM | POA: Insufficient documentation

## 2024-06-30 DIAGNOSIS — S0101XA Laceration without foreign body of scalp, initial encounter: Secondary | ICD-10-CM | POA: Diagnosis present

## 2024-06-30 DIAGNOSIS — W06XXXA Fall from bed, initial encounter: Secondary | ICD-10-CM | POA: Insufficient documentation

## 2024-06-30 DIAGNOSIS — W19XXXA Unspecified fall, initial encounter: Secondary | ICD-10-CM

## 2024-06-30 DIAGNOSIS — I110 Hypertensive heart disease with heart failure: Secondary | ICD-10-CM | POA: Diagnosis not present

## 2024-06-30 LAB — CBC WITH DIFFERENTIAL/PLATELET
Abs Immature Granulocytes: 0.02 10*3/uL (ref 0.00–0.07)
Basophils Absolute: 0 10*3/uL (ref 0.0–0.1)
Basophils Relative: 0 %
Eosinophils Absolute: 0 10*3/uL (ref 0.0–0.5)
Eosinophils Relative: 1 %
HCT: 40 % (ref 36.0–46.0)
Hemoglobin: 13.2 g/dL (ref 12.0–15.0)
Immature Granulocytes: 0 %
Lymphocytes Relative: 26 %
Lymphs Abs: 1.6 10*3/uL (ref 0.7–4.0)
MCH: 32.7 pg (ref 26.0–34.0)
MCHC: 33 g/dL (ref 30.0–36.0)
MCV: 99 fL (ref 80.0–100.0)
Monocytes Absolute: 0.7 10*3/uL (ref 0.1–1.0)
Monocytes Relative: 11 %
Neutro Abs: 3.6 10*3/uL (ref 1.7–7.7)
Neutrophils Relative %: 62 %
Platelets: 214 10*3/uL (ref 150–400)
RBC: 4.04 MIL/uL (ref 3.87–5.11)
RDW: 13.2 % (ref 11.5–15.5)
WBC: 6 10*3/uL (ref 4.0–10.5)
nRBC: 0 % (ref 0.0–0.2)

## 2024-06-30 LAB — BASIC METABOLIC PANEL WITH GFR
Anion gap: 11 (ref 5–15)
BUN: 15 mg/dL (ref 8–23)
CO2: 25 mmol/L (ref 22–32)
Calcium: 10.4 mg/dL — ABNORMAL HIGH (ref 8.9–10.3)
Chloride: 101 mmol/L (ref 98–111)
Creatinine, Ser: 0.68 mg/dL (ref 0.44–1.00)
GFR, Estimated: >60 mL/min
Glucose, Bld: 100 mg/dL — ABNORMAL HIGH (ref 70–99)
Potassium: 3.7 mmol/L (ref 3.5–5.1)
Sodium: 138 mmol/L (ref 135–145)

## 2024-06-30 NOTE — ED Notes (Signed)
 Patient transported to CT

## 2024-06-30 NOTE — ED Notes (Signed)
 ED Provider at bedside.

## 2024-06-30 NOTE — ED Triage Notes (Addendum)
 Pt reports waking up in the floor beside her bed, does not remember falling. Pt has laceration to posterior head, bleeding controlled at this time. A&O in triage. Denies any other injury. Family at bedside

## 2024-06-30 NOTE — ED Provider Notes (Signed)
 " [facility] [facility] AT [facility] Provider Note   CSN: [id number] Arrival date & time: 06/30/24  9443     Patient presents with: [name] is a 89 y.o. female.   Patient is an 89 year old female with past medical history of hypertension, cardiomyopathy, CHF, GERD.  Patient presenting today for evaluation of a fall with head injury.  She was asleep last night, then found herself on the floor with a laceration to the back of her head.  She does not recall what happened, whether she fell out of bed or stood up, then fell.  She reports mild headache and neck discomfort, however the neck discomfort is not unusual for her.  She denies any other injury in the fall.  She did require assistance from family members to get back on her feet.       Prior to Admission medications  Medication Sig Start Date End Date Taking? Authorizing Provider  acetaminophen  (TYLENOL ) 325 MG tablet Take 1-2 tablets (325-650 mg total) by mouth every 4 (four) hours as needed for mild pain (pain score 1-3) or fever (or temp > 100.5). 03/02/24   Vann, Jessica U, DO  ALPRAZolam  (XANAX ) 0.5 MG tablet Take 0.5 tablets (0.25 mg total) by mouth 3 (three) times daily as needed (restless legs). 03/02/24   Vann, Jessica U, DO  apixaban  (ELIQUIS ) 2.5 MG TABS tablet Take 1 tablet (2.5 mg total) by mouth 2 (two) times daily. Patient not taking: Reported on 05/27/2024 02/08/24 03/09/24  Deward Eck, PA-C  Ascorbic Acid (VITAMIN C PO) Take 500 mg by mouth daily.    [provider]  Calcium  Carbonate-Vitamin D  (CALCIUM -D PO) Take 1 tablet by mouth daily.    [provider]  celecoxib (CELEBREX) 200 MG capsule Take 200 mg by mouth daily as needed. 02/18/24   [provider]  dexlansoprazole (DEXILANT) 60 MG capsule Take 60 mg by mouth every morning.    [provider]  [Paused] escitalopram  (LEXAPRO ) 10 MG tablet Take 10 mg by mouth in the morning and at  bedtime. Patient taking differently: Take 10 mg by mouth in the morning and at bedtime. 05/27/2024 States never stopped taking. Wait to take this until your doctor or other care provider tells you to start again.    [provider]  levothyroxine  (SYNTHROID , LEVOTHROID) 25 MCG tablet Take 25 mcg by mouth daily before breakfast.     [provider]  losartan  (COZAAR ) 50 MG tablet Take 50 mg by mouth daily. 05/25/24   [provider]  metoprolol  succinate (TOPROL -XL) 50 MG 24 hr tablet Take 1.5 tablets (75 mg total) by mouth daily. Take with or immediately following a meal. 12/28/22   Cindie Ole DASEN, MD  Multiple Vitamin (MULTIVITAMIN WITH MINERALS) TABS tablet Take 1 tablet by mouth daily.    [provider]  polyethylene glycol (MIRALAX  / GLYCOLAX ) 17 g packet Take 17 g by mouth every other day. Patient not taking: Reported on 05/27/2024    [provider]  Probiotic Product (ALIGN) 4 MG CAPS Take 1 capsule by mouth daily.    [provider]  pyridoxine  (B-6) 500 MG tablet Take 1 tablet (500 mg total) by mouth daily. 02/24/22   Timmy Maude SAUNDERS, MD  rosuvastatin  (CRESTOR ) 20 MG tablet Take 1 tablet (20 mg total) by mouth daily. 04/22/21   Jerri Keys, MD  sennosides-docusate sodium  (SENOKOT-S) 8.6-50 MG tablet Take 2 tablets by mouth at bedtime.  [provider]  [Paused] spironolactone  (ALDACTONE ) 25 MG tablet TAKE 1 TABLET (25 MG TOTAL) BY MOUTH DAILY. Patient taking differently: Take 25 mg by mouth daily. 05/27/2024 Takes 12.5 mg for swelling. Wait to take this until your doctor or other care provider tells you to start again. 12/11/23   Thukkani, Arun K, MD  Vitamin D , Ergocalciferol , (DRISDOL) 50000 UNITS CAPS Take 50,000 Units by mouth every Friday.    [provider]  VITAMIN E PO Take 400 Units by mouth daily.    [provider]  zolpidem  (AMBIEN  CR) 12.5 MG CR tablet Take 12.5 mg by mouth at bedtime.     [provider]    Allergies: Patient has no known allergies.    Review of Systems  All other systems reviewed and are negative.   Updated Vital Signs BP (!) 192/92   Pulse 76   Temp 97.7 F (36.5 C) (Oral)   Resp 17   SpO2 94%   Physical Exam Vitals and nursing note reviewed.  Constitutional:      General: She is not in acute distress.    Appearance: She is well-developed. She is not diaphoretic.  HENT:     Head: Normocephalic.     Comments: There is an approximately 1 cm laceration noted to the back of the head/occiput.  Bleeding is controlled. Eyes:     Extraocular Movements: Extraocular movements intact.     Pupils: Pupils are equal, round, and reactive to light.  Cardiovascular:     Rate and Rhythm: Normal rate and regular rhythm.     Heart sounds: No murmur heard.    No friction rub. No gallop.  Pulmonary:     Effort: Pulmonary effort is normal. No respiratory distress.     Breath sounds: Normal breath sounds. No wheezing.  Abdominal:     General: Bowel sounds are normal. There is no distension.     Palpations: Abdomen is soft.     Tenderness: There is no abdominal tenderness.  Musculoskeletal:        General: Normal range of motion.     Cervical back: Normal range of motion and neck supple.  Skin:    General: Skin is warm and dry.  Neurological:     General: No focal deficit present.     Mental Status: She is alert and oriented to person, place, and time.     Cranial Nerves: No cranial nerve deficit.     Motor: No weakness.     (all labs ordered are listed, but only abnormal results are displayed) Labs Reviewed  BASIC METABOLIC PANEL WITH GFR  CBC WITH DIFFERENTIAL/PLATELET    EKG: EKG Interpretation Date/Time:  Sunday June 30 2024 06:11:39 EST Ventricular Rate:  79 PR Interval:  185 QRS Duration:  81 QT Interval:  421 QTC Calculation: 483 R Axis:   -29  Text Interpretation: Sinus rhythm Ventricular trigeminy Probable left atrial  enlargement Left ventricular hypertrophy Anterior Q waves, possibly due to LVH PVC's, otherwise no significant changes from 02/27/2024 Confirmed by Geroldine Berg ([id number]) on 06/30/2024 6:19:40 AM  Radiology: No results found.   Procedures   Medications Ordered in the ED - No data to display                                  Medical Decision Making Amount and/or Complexity of Data Reviewed Labs: ordered. Radiology: ordered.   Patient presenting here  after a fall at home.  She apparently fell out of bed and onto the floor, but has no recollection of this.  Patient arrives with a small laceration to the back of her head and slight headache and neck discomfort.  She is neurologically intact with stable vital signs.  Laboratory studies obtained including CBC and basic metabolic panel, both of which are unremarkable.  CT scan of the head and cervical spine have been obtained, both of which are pending.  Care will be signed out to oncoming provider awaiting results of the imaging studies.  If negative, I feel as though patient can safely be discharged.  The laceration on her head is approximately 1 cm in length and I do not feel requires repair.  Bleeding is controlled and it is well-approximated.     Final diagnoses:  None    ED Discharge Orders     None          Geroldine Berg, MD 06/30/24 [phone]  "

## 2024-07-22 ENCOUNTER — Ambulatory Visit

## 2024-07-24 ENCOUNTER — Ambulatory Visit

## 2024-07-29 ENCOUNTER — Ambulatory Visit: Payer: Medicare Other

## 2024-08-24 ENCOUNTER — Ambulatory Visit

## 2024-08-26 ENCOUNTER — Ambulatory Visit

## 2024-09-02 ENCOUNTER — Ambulatory Visit: Payer: Medicare Other

## 2024-09-09 ENCOUNTER — Inpatient Hospital Stay

## 2024-09-09 ENCOUNTER — Inpatient Hospital Stay: Admitting: Hematology & Oncology

## 2024-09-24 ENCOUNTER — Ambulatory Visit

## 2024-09-30 ENCOUNTER — Ambulatory Visit

## 2024-10-03 ENCOUNTER — Ambulatory Visit: Admitting: Neurology

## 2024-10-07 ENCOUNTER — Ambulatory Visit: Payer: Medicare Other

## 2024-10-25 ENCOUNTER — Ambulatory Visit

## 2024-11-04 ENCOUNTER — Ambulatory Visit

## 2024-11-11 ENCOUNTER — Ambulatory Visit: Payer: Medicare Other

## 2024-11-25 ENCOUNTER — Ambulatory Visit

## 2024-12-09 ENCOUNTER — Ambulatory Visit

## 2024-12-16 ENCOUNTER — Ambulatory Visit: Payer: Medicare Other

## 2025-01-13 ENCOUNTER — Ambulatory Visit

## 2025-01-20 ENCOUNTER — Ambulatory Visit: Payer: Medicare Other

## 2025-02-24 ENCOUNTER — Ambulatory Visit: Payer: Medicare Other
